# Patient Record
Sex: Female | Born: 1935 | Race: White | Hispanic: No | Smoking: Former smoker
Health system: Southern US, Community
[De-identification: ages and names within clinical notes are randomized; demographics above are authoritative.]

## PROBLEM LIST (undated history)

## (undated) DIAGNOSIS — Z780 Asymptomatic menopausal state: Secondary | ICD-10-CM

## (undated) DIAGNOSIS — F0390 Unspecified dementia without behavioral disturbance: Secondary | ICD-10-CM

## (undated) DIAGNOSIS — R918 Other nonspecific abnormal finding of lung field: Secondary | ICD-10-CM

## (undated) DIAGNOSIS — I779 Disorder of arteries and arterioles, unspecified: Secondary | ICD-10-CM

## (undated) DIAGNOSIS — M1990 Unspecified osteoarthritis, unspecified site: Secondary | ICD-10-CM

## (undated) DIAGNOSIS — I700 Atherosclerosis of aorta: Secondary | ICD-10-CM

## (undated) DIAGNOSIS — I739 Peripheral vascular disease, unspecified: Secondary | ICD-10-CM

## (undated) DIAGNOSIS — M810 Age-related osteoporosis without current pathological fracture: Secondary | ICD-10-CM

## (undated) DIAGNOSIS — I10 Essential (primary) hypertension: Secondary | ICD-10-CM

## (undated) DIAGNOSIS — I2510 Atherosclerotic heart disease of native coronary artery without angina pectoris: Secondary | ICD-10-CM

## (undated) DIAGNOSIS — A6000 Herpesviral infection of urogenital system, unspecified: Secondary | ICD-10-CM

## (undated) DIAGNOSIS — R251 Tremor, unspecified: Secondary | ICD-10-CM

## (undated) DIAGNOSIS — E785 Hyperlipidemia, unspecified: Secondary | ICD-10-CM

## (undated) DIAGNOSIS — E079 Disorder of thyroid, unspecified: Secondary | ICD-10-CM

## (undated) DIAGNOSIS — T462X1A Poisoning by other antidysrhythmic drugs, accidental (unintentional), initial encounter: Secondary | ICD-10-CM

## (undated) DIAGNOSIS — K635 Polyp of colon: Secondary | ICD-10-CM

## (undated) DIAGNOSIS — E042 Nontoxic multinodular goiter: Secondary | ICD-10-CM

## (undated) DIAGNOSIS — R300 Dysuria: Secondary | ICD-10-CM

## (undated) DIAGNOSIS — E041 Nontoxic single thyroid nodule: Secondary | ICD-10-CM

## (undated) DIAGNOSIS — E7800 Pure hypercholesterolemia, unspecified: Secondary | ICD-10-CM

## (undated) DIAGNOSIS — IMO0002 Reserved for concepts with insufficient information to code with codable children: Secondary | ICD-10-CM

## (undated) DIAGNOSIS — R52 Pain, unspecified: Secondary | ICD-10-CM

## (undated) DIAGNOSIS — M169 Osteoarthritis of hip, unspecified: Secondary | ICD-10-CM

## (undated) DIAGNOSIS — J3489 Other specified disorders of nose and nasal sinuses: Secondary | ICD-10-CM

## (undated) DIAGNOSIS — B029 Zoster without complications: Secondary | ICD-10-CM

## (undated) DIAGNOSIS — N390 Urinary tract infection, site not specified: Secondary | ICD-10-CM

## (undated) DIAGNOSIS — M79609 Pain in unspecified limb: Secondary | ICD-10-CM

## (undated) HISTORY — DX: Tremor, unspecified: R25.1

## (undated) HISTORY — PX: FOOT SURGERY: SHX648

## (undated) HISTORY — DX: Herpesviral infection of urogenital system, unspecified: A60.00

## (undated) HISTORY — DX: Other nonspecific abnormal finding of lung field: R91.8

## (undated) HISTORY — DX: Poisoning by other antidysrhythmic drugs, accidental (unintentional), initial encounter: T46.2X1A

## (undated) HISTORY — DX: Essential (primary) hypertension: I10

## (undated) HISTORY — DX: Disorder of arteries and arterioles, unspecified: I77.9

## (undated) HISTORY — DX: Peripheral vascular disease, unspecified: I73.9

## (undated) HISTORY — PX: TOTAL HIP ARTHROPLASTY: SHX124

## (undated) HISTORY — DX: Atherosclerotic heart disease of native coronary artery without angina pectoris: I25.10

## (undated) HISTORY — DX: Polyp of colon: K63.5

## (undated) HISTORY — DX: Disorder of thyroid, unspecified: E07.9

## (undated) HISTORY — DX: Atherosclerosis of aorta: I70.0

## (undated) HISTORY — DX: Hyperlipidemia, unspecified: E78.5

## (undated) HISTORY — DX: Unspecified osteoarthritis, unspecified site: M19.90

## (undated) HISTORY — PX: BREAST CYST EXCISION: SHX579

## (undated) HISTORY — DX: Age-related osteoporosis without current pathological fracture: M81.0

## (undated) MED ORDER — OLMESARTAN 20 MG TAB
20 mg | ORAL_TABLET | Freq: Every day | ORAL | Status: DC
Start: ? — End: 2012-10-28

## (undated) MED ORDER — MOMETASONE 50 MCG/ACTUATION NASAL SPRAY: 50 mcg/actuation | Freq: Every day | NASAL | Status: AC

## (undated) MED ORDER — VALSARTAN-HYDROCHLOROTHIAZIDE 160 MG-12.5 MG TAB: ORAL_TABLET | Freq: Every day | ORAL | Status: AC

## (undated) MED ORDER — PRAVASTATIN 80 MG TAB
80 mg | ORAL_TABLET | ORAL | Status: DC
Start: ? — End: 2013-03-14

## (undated) MED ORDER — VALACYCLOVIR 500 MG TAB: 500 mg | ORAL_TABLET | Freq: Every day | ORAL | Status: AC

## (undated) MED ORDER — PRAVASTATIN 80 MG TAB: 80 mg | ORAL_TABLET | ORAL | Status: AC

## (undated) MED ORDER — VALSARTAN-HYDROCHLOROTHIAZIDE 160 MG-12.5 MG TAB
ORAL_TABLET | Freq: Every day | ORAL | Status: DC
Start: ? — End: 2013-03-14

## (undated) MED ORDER — FUROSEMIDE 20 MG TAB
20 mg | ORAL_TABLET | ORAL | Status: DC
Start: ? — End: 2012-10-28

## (undated) MED ORDER — RALOXIFENE 60 MG TAB: 60 mg | ORAL_TABLET | ORAL | Status: AC

---

## 1983-10-17 HISTORY — PX: BREAST SURGERY: SHX581

## 1986-10-16 HISTORY — PX: APPENDECTOMY: SHX54

## 1986-10-16 HISTORY — PX: ABDOMINAL HYSTERECTOMY: SHX81

## 2008-01-30 LAB — CBC WITH AUTOMATED DIFF
ABS. BASOPHILS: 0 10*3/uL (ref 0.0–0.1)
ABS. EOSINOPHILS: 0.1 10*3/uL (ref 0.0–0.4)
ABS. LYMPHOCYTES: 2.1 10*3/uL (ref 0.8–3.5)
ABS. MONOCYTES: 0.3 10*3/uL (ref 0–1.0)
ABS. NEUTROPHILS: 2.7 10*3/uL (ref 1.8–8.0)
BASOPHILS: 1 % (ref 0–1)
EOSINOPHILS: 2 % (ref 0–7)
HCT: 45.1 % (ref 35.0–47.0)
HGB: 13.9 g/dL (ref 11.5–16.0)
LYMPHOCYTES: 40 % (ref 12–49)
MCH: 28.5 PG (ref 26.0–34.0)
MCHC: 30.8 g/dL (ref 30.0–35.0)
MCV: 92.4 FL (ref 80.0–99.0)
MONOCYTES: 6 % (ref 5–13)
NEUTROPHILS: 51 % (ref 32–75)
PLATELET: 302 10*3/uL (ref 150–400)
RBC: 4.88 M/uL (ref 3.80–5.20)
RDW: 14.5 % (ref 11.5–14.5)
WBC: 5.2 10*3/uL (ref 3.6–11.0)

## 2008-01-30 LAB — METABOLIC PANEL, COMPREHENSIVE
A-G Ratio: 1.2 (ref 1.1–2.2)
ALT (SGPT): 42 U/L (ref 30–65)
AST (SGOT): 19 U/L (ref 15–37)
Albumin: 3.8 g/dL (ref 3.5–5.0)
Alk. phosphatase: 92 U/L (ref 50–136)
Anion gap: 5 mmol/L (ref 5–15)
BUN/Creatinine ratio: 13 (ref 12–20)
BUN: 12 MG/DL (ref 6–20)
Bilirubin, total: 0.3 MG/DL (ref <1.0)
CO2: 29 MMOL/L (ref 21–32)
Calcium: 9 MG/DL (ref 8.5–10.1)
Chloride: 110 MMOL/L — ABNORMAL HIGH (ref 97–108)
Creatinine: 0.9 MG/DL (ref 0.6–1.3)
GFR est AA: >60 mL/min/{1.73_m2} (ref >60)
GFR est non-AA: >60 mL/min/{1.73_m2} (ref >60)
Globulin: 3.2 g/dL (ref 2.0–4.0)
Glucose: 98 MG/DL (ref 50–100)
Potassium: 4.1 MMOL/L (ref 3.5–5.1)
Protein, total: 7 g/dL (ref 6.4–8.2)
Sodium: 144 MMOL/L (ref 136–145)

## 2008-01-30 LAB — CK: CK: 182 U/L (ref 21–215)

## 2008-01-30 LAB — URINALYSIS W/MICROSCOPIC
Bilirubin: NEGATIVE
Glucose: NEGATIVE MG/DL
Ketone: NEGATIVE MG/DL
Leukocyte Esterase: NEGATIVE
Nitrites: NEGATIVE
Protein: NEGATIVE MG/DL
Specific gravity: 1.019 (ref 1.003–1.030)
Urobilinogen: 0.2 EU/DL (ref 0.2–1.0)
pH (UA): 6 (ref 5.0–8.0)

## 2008-01-30 LAB — LIPID PANEL
CHOL/HDL Ratio: 3.6 (ref 0–5.0)
Cholesterol, total: 193 MG/DL (ref <200)
HDL Cholesterol: 53 MG/DL (ref 40–60)
LDL, calculated: 105.6 MG/DL — ABNORMAL HIGH (ref 0–100)
Triglyceride: 172 MG/DL (ref 30–200)
VLDL, calculated: 34.4 MG/DL

## 2008-01-30 LAB — TSH 3RD GENERATION: TSH: 1.48 u[IU]/mL (ref 0.35–5.5)

## 2008-02-01 LAB — CULTURE, URINE
Colonies Counted: >100000
Colony Count: >100000

## 2008-02-01 LAB — VITAMIN D, 25 HYDROXY: Vitamin D 25-Hydroxy: 34 ng/mL (ref 30–80)

## 2008-02-14 DIAGNOSIS — Q6101 Congenital single renal cyst: Secondary | ICD-10-CM | POA: Insufficient documentation

## 2008-02-25 LAB — URINALYSIS W/MICROSCOPIC
Bacteria: NEGATIVE /HPF
Bilirubin: NEGATIVE
Glucose: NEGATIVE MG/DL
Ketone: NEGATIVE MG/DL
Leukocyte Esterase: NEGATIVE
Nitrites: NEGATIVE
Protein: NEGATIVE MG/DL
Specific gravity: 1.017 (ref 1.003–1.030)
Urobilinogen: 0.2 EU/DL (ref 0.2–1.0)
pH (UA): 5.5 (ref 5.0–8.0)

## 2008-06-10 MED ORDER — ZOSTER VACCINE LIVE (PF) 19,400 UNIT SUB-Q SOLN
19400 unit/0.65 mL | Freq: Once | SUBCUTANEOUS | Status: DC
Start: 2008-06-10 — End: 2008-09-23

## 2008-06-10 MED ORDER — COLESEVELAM 625 MG TAB
625 mg | ORAL_TABLET | Freq: Two times a day (BID) | ORAL | Status: DC
Start: 2008-06-10 — End: 2008-09-23

## 2008-06-10 NOTE — Patient Instructions (Signed)
English   Spanish   Back Pain: After Your Visit  Your Care Instructions  Back pain has many possible causes. It is often related to problems with muscles and ligaments of the back. It may also be related to problems with the nerves, discs, or bones of the back. Moving, lifting, standing, sitting, or sleeping in an awkward way can strain the back. Sometimes you do not notice the injury until later. Arthritis is another common cause of back pain.  Although it may hurt a lot, back pain usually improves on its own within several weeks. Most people recover in 12 weeks or less. Using good home treatment and being careful not to stress your back can help you feel better sooner.  Follow-up care is a key part of your treatment and safety. Be sure to make and go to all appointments, and call your doctor if you are having problems. It???s also a good idea to know your test results and keep a list of the medicines you take.  How can you care for yourself at home?  ?? Sit or lie in positions that are most comfortable and reduce your pain. Try one of these positions when you lie down:   Lie on your back with your knees bent and supported by large pillows.   Lie on the floor with your legs on the seat of a sofa or chair.   Lie on your side with your knees and hips bent and a pillow between your legs.   Lie on your stomach if it does not make pain worse.  Do not sit up in bed, and avoid soft couches and twisted positions. Bed rest can help relieve pain at first, but it delays healing. Avoid bed rest after the first day.   Change positions every 30 minutes. If you must sit for long periods of time, take breaks from sitting. Get up and walk around, or lie in a comfortable position.   For the first 2 or 3 days, put ice or cold packs on your back for 10 to 20 minutes at a time, several times a day. (Put a thin cloth between the ice pack and your skin.) This reduces pain.    After the first 2 or 3 days, use a warm pack or heating pad for 20 minutes at a time. Hot showers will also help. Hot baths may help as long as you can lie or sit in a position that does not stress your back. You may also keep using ice if it helps.   Take pain medicines exactly as directed.   If the doctor gave you a prescription medicine for pain, take it as prescribed.   If you are not taking a prescription pain medicine, ask your doctor if you can take an over-the-counter medicine.   Do not take two or more pain medicines at the same time unless the doctor told you to. Many pain medicines have acetaminophen, which is Tylenol. Too much acetaminophen (Tylenol) can be harmful.  Take short walks several times a day. You can start with 5 to 10 minutes, 3 to 4 times a day, and work up to longer walks. Stick to level surfaces and avoid hills and stairs until your back is better.   Return to work and other activities as soon as you can. Continued rest without activity is usually not good for your back.   To prevent future back pain, do exercises to stretch and strengthen your back and stomach. Learn how  to use good posture, safe lifting techniques, and proper body mechanics.  When should you call for help?  Call 911 anytime you think you may need emergency care. For example, call if:  ?? You lose bladder or bowel control.   You suddenly cannot walk or stand.   You have sudden numbness or weakness in both legs.  Call your doctor now or seek immediate medical care if:  ?? You have new pain, numbness, tingling, or weakness, especially in the buttocks, genital or rectal area, legs, or feet.   You have symptoms of a urinary infection. For example:   You have blood or pus in your urine.   You have pain in your back just below your rib cage. This is called flank pain.   You have a fever, chills, or body aches.   It hurts to urinate. You have groin or belly pain.   Watch closely for changes in your health, and be sure to contact your doctor if:  ?? Your back pain gets worse or more frequent.   Your back pain is not better after 1 week of home treatment. It may take a lot longer for the pain to go away completely, but it should feel at least a little better.   Where can you learn more?   Go to [email or web address]  Enter I594 in the search box to learn more about "Back Pain: After Your Visit".    ?? 2006 - 2009 [employer]. Care instructions adapted under license by [employer] (which disclaims liability or warranty for this information) . This care instruction is for use with your licensed healthcare professional. If you have questions about a medical condition or this instruction, always ask your healthcare professional. [employer]wise disclaims any warranty or liability for your use of this information.  English   Spanish   High Blood Pressure: After Your Visit  Your Care Instructions  If your blood pressure is usually above 140/90, you have high blood pressure, or hypertension. Despite what a lot of people think, high blood pressure usually does not cause headaches or make you feel dizzy or lightheaded. It usually has no symptoms. But it does increase your risk for heart attack, stroke, and kidney or eye damage. The higher your blood pressure, the more your risk increases.  Changes in your lifestyle, such as staying at a healthy weight, may help you lower your blood pressure. Your treatment also will include medicine. If you stop taking your medicine, your blood pressure will go back up.  Follow-up care is a key part of your treatment and safety. Be sure to make and go to all appointments, and call your doctor if you are having problems. It???s also a good idea to know your test results and keep a list of the medicines you take.  How can you care for yourself at home?  Medical treatment   ?? Take your medicine exactly as prescribed. You may take one or more types of medicine to lower your blood pressure. They include diuretics, beta-blockers, ACE inhibitors, calcium channel blockers, angiotensin II receptor blockers, and other medicines. Call your doctor if you think you are having a problem with your medicine.   Your doctor may suggest that you take one low-dose aspirin (81 mg) a day. This can help reduce your risk of having a stroke or heart attack.   See your doctor at least 2 times a year. You may need to see the doctor more often at first or until your  blood pressure comes down.   If you are taking blood pressure medicine, talk to your doctor before you take decongestants or anti-inflammatory medicine, such as ibuprofen. Some of these medicines can raise blood pressure.   Learn how to check your blood pressure at home.  Lifestyle changes  ?? Stay at a healthy weight. This is especially important if you put on weight around the waist. Losing even 10 pounds can help you lower your blood pressure.   If your doctor recommends it, get more exercise. Walking is a good choice. Bit by bit, increase the amount you walk every day. Try for at least 30 minutes on most days of the week. You also may want to swim, bike, or do other activities.   Avoid or limit alcohol. Talk to your doctor about whether you can drink any alcohol.   Limit salt.   Eat plenty of fruits (such as bananas and oranges), vegetables, legumes, whole grains, and low-fat dairy products.   Lower the amount of saturated fat in your diet. Saturated fat is found in animal products such as milk, cheese, and meat. Limiting these foods may help you lose weight and also lower your risk for heart disease.   Do not smoke. Smoking increases your risk for heart attack and stroke. If you need help quitting, talk to your doctor about stop-smoking programs and medicines. These can increase your chances of quitting for good.   When should you call for help?  Call 911 anytime you think you may need emergency care. For example, call if:  ?? You have chest pain or pressure. This may occur with:   Sweating.   Shortness of breath.   Nausea or vomiting.   Pain that spreads from the chest to the neck, jaw, or one or both shoulders or arms.   Dizziness or lightheadedness.   A fast or uneven pulse.  After calling 911, chew 1 adult-strength aspirin. Wait for an ambulance. Do not try to drive yourself.   ?? You have signs of a stroke. These include:   Sudden numbness, paralysis, or weakness in your face, arm, or leg, especially on only one side of your body.   New problems with walking or balance.   Sudden vision changes.   New problems speaking or understanding simple statements, or feeling confused.   A sudden, severe headache that is different from past headaches.  Call your doctor now or seek immediate medical care if:  ?? Your blood pressure rises suddenly.   Your blood pressure is 180/110 or higher.   You are dizzy or lightheaded, or you feel like you may faint.   Your blood pressure is higher than 140/90 on two or more occasions.  Watch closely for changes in your health, and be sure to contact your doctor if:  ?? You do not get better as expected.   Where can you learn more?   Go to [email or web address]  Enter [phone] in the search box to learn more about "High Blood Pressure: After Your Visit".    ?? 2006 - 2009 [employer]. Care instructions adapted under license by [employer] (which disclaims liability or warranty for this information) . This care instruction is for use with your licensed healthcare professional. If you have questions about a medical condition or this instruction, always ask your healthcare professional. [employer] disclaims any warranty or liability for your use of this information.  English   Spanish   Hyperlipidemia: After Your Visit  Your Care Instructions  Hyperlipidemia is too much fat in your blood. The body has several kinds of fat, including cholesterol and triglycerides. Your body needs fat for many things, such as making new cells. But too much fat in your blood increases your chances of having a heart attack or stroke.  You may be able to lower your cholesterol and triglycerides with a heart-healthy diet, exercise, and if needed, medicine. Your doctor may want you to try lifestyle changes to see whether they lower the fat in your blood. You may need to take medicine if lifestyle changes do not lower the fat in your blood enough.  Follow-up care is a key part of your treatment and safety. Be sure to make and go to all appointments, and call your doctor if you are having problems. It???s also a good idea to know your test results and keep a list of the medicines you take.  How can you care for yourself at home?  Take your medicines  ?? Take your medicines exactly as prescribed. Call your doctor if you think you are having a problem with your medicine.   If you take medicine to lower your cholesterol, go to follow-up visits. You will need to have blood tests.   Do not take large doses of niacin, which is a B vitamin, while taking medicine called statins. It may increase the chance of muscle pain and liver problems.   Talk to your doctor about avoiding grapefruit juice if you are taking statins. Grapefruit juice can raise the level of this medicine in your blood. This could increase side effects.  Eat more fruits, vegetables, and fiber  ?? Fruits and vegetables have lots of nutrients that help protect against heart disease, and they have little???if any???fat. Try to eat at least five servings a day. Dark green, deep orange, or yellow fruits and vegetables are healthy choices.    Keep carrots, celery, and other veggies handy for snacks. Buy fruit that is in season and store it where you can see it so that you will be tempted to eat it. Cook dishes that have a lot of veggies in them, such as stir fries and soups.   Foods high in fiber may reduce your cholesterol and provide important vitamins and minerals. High-fiber foods include whole-grain cereals and breads, oatmeal, beans, brown rice, citrus fruits, and apples.   Buy whole-grain breads and cereals instead of white bread and pastries.  Limit saturated fat  ?? Read food labels and try to avoid saturated fat and trans fat. They increase your risk of heart disease.   Use olive or canola oil when you cook. Try cholesterol-lowering spreads, such as Benecol or Take Control.   Bake, broil, grill, or steam foods instead of frying them.   Limit the amount of high-fat meats you eat, including hot dogs and sausages. Cut out all visible fat when you prepare meat.   Eat fish, skinless poultry, and soy products such as tofu instead of high-fat meats. Soybeans may be especially good for your heart. Eat at least two servings of fish a week. Certain fish, such as salmon, contain omega-3 fatty acids, which may help reduce your risk of heart attack.   Choose low-fat or fat-free milk and dairy products.  Get exercise, limit alcohol, and quit smoking  ?? Get more exercise. Work with your doctor to set up an exercise program. Even if you can do only a small amount, exercise will help you get stronger, have more energy, and manage your  weight and your stress. Walking is an easy way to get exercise. Gradually increase the amount you walk every day. Aim for at least 30 minutes on most days of the week. You also may want to swim, bike, or do other activities.   Limit alcohol to no more than 2 drinks a day for men and 1 drink a day for women. Too much alcohol can increase triglycerides and cause other health problems.    Do not smoke. If you need help quitting, talk to your doctor about stop-smoking programs and medicines. These can increase your chances of quitting for good.  When should you call for help?  Call 911 anytime you think you may need emergency care. For example, call if:  ?? You have chest pain or pressure. This may occur with:   Sweating.   Shortness of breath.   Nausea or vomiting.   Pain that spreads from the chest to the neck, jaw, or one or both shoulders or arms.   Dizziness or lightheadedness.   A fast or uneven pulse.  After calling 911, chew 1 adult-strength aspirin. Wait for an ambulance. Do not try to drive yourself.   ?? You have signs of a stroke. These may include:   Sudden numbness, paralysis, or weakness in your face, arm, or leg, especially on only one side of your body.   New problems with walking or balance.   Sudden vision changes.   Drooling or slurred speech.   New problems speaking or understanding simple statements, or feeling confused.   A sudden, severe headache that is different from past headaches.  You pass out (lose consciousness).  Call your doctor now or seek immediate medical care if:  ?? You have muscle pain or weakness.  Watch closely for changes in your health, and be sure to contact your doctor if:  ?? You are very tired.   You have an upset stomach, gas, constipation, or belly pain or cramps.   Where can you learn more?   Go to [email or web address]  Enter C406 in the search box to learn more about "Hyperlipidemia: After Your Visit".    ?? 2006 - 2009 [employer]. Care instructions adapted under license by [employer] (which disclaims liability or warranty for this information) . This care instruction is for use with your licensed healthcare professional. If you have questions about a medical condition or this instruction, always ask your healthcare professional. [employer]wise disclaims any warranty or liability for your use of this information.   English   Spanish   The DASH Diet: After Your Visit  Your Care Instructions  The DASH diet is an eating plan that can help lower your blood pressure. DASH stands for Dietary Approaches to Stop Hypertension. Hypertension is high blood pressure.  The DASH diet focuses on eating foods that are high in calcium, potassium, and magnesium. These nutrients can lower blood pressure. The foods that are highest in these nutrients are fruits, vegetables, low-fat dairy products, nuts, seeds, and legumes. But taking calcium, potassium, and magnesium supplements instead of eating foods that are high in those nutrients does not have the same effect. The DASH diet also includes whole grains, fish, and poultry.  The DASH diet is one of several lifestyle changes your doctor may recommend to lower your high blood pressure. Your doctor may also want you to decrease the amount of sodium in your diet. Lowering sodium while following the DASH diet can lower blood pressure even further than just the DASH  diet alone.  Follow-up care is a key part of your treatment and safety. Be sure to make and go to all appointments, and call your doctor if you are having problems. It???s also a good idea to know your test results and keep a list of the medicines you take.  How can you care for yourself at home?  Following the DASH diet  ?? Eat 4 to 5 servings of fruit each day. A serving is 1 medium-sized piece of fruit, 1/2 cup chopped or canned fruit, 1/4 cup dried fruit, or 4 ounces (1/2 cup) of fruit juice. Choose fruit more often than fruit juice.   Eat 4 to 5 servings of vegetables each day. A serving is 1 cup of lettuce or raw leafy vegetables, 1/2 cup of chopped or cooked vegetables, or 6 ounces (3/4 cup) of vegetable juice. Choose vegetables more often than vegetable juice.   Get 3 servings of low-fat dairy each day. A serving is 8 ounces of milk, 1 cup of yogurt, or 1 1/2 ounces of cheese.    Eat 7 to 8 servings of grains each day. A serving is 1 slice of bread, 1 ounce of dry cereal, or 1/2 cup of cooked rice, pasta, or cooked cereal. Try to choose whole-grain products as much as possible.   Limit lean meat, poultry, and fish to 6 ounces each day. Six ounces is about the size of two decks of cards.   Eat 4 to 5 servings of nuts, seeds, and legumes (cooked dried beans, lentils, and split peas) each week. A serving is 1/3 cup of nuts, 2 tablespoons of seeds, or 1/4 cup cooked dried beans or peas.  Tips for success  ?? Start small. Do not try to make dramatic changes to your diet all at once. You might feel that you are missing out on your favorite foods and then be more likely to not follow the plan. Make small changes, and stick with them. Once those changes become habit, add a few more changes.   Try some of the following:   Make it a goal to eat a fruit or vegetable at every meal and at snacks. This will make it easy to get the recommended amount of fruits and vegetables each day.   Try yogurt topped with fruit and nuts for a snack or healthy dessert.   Add lettuce, tomato, cucumber, and onion to sandwiches.   Combine a ready-made pizza crust with low-fat mozzarella cheese and lots of vegetable toppings. Try using tomatoes, squash, spinach, broccoli, carrots, cauliflower, and onions.   Have a variety of cut-up vegetables with a low-fat dip as an appetizer instead of chips and dip.   Sprinkle sunflower seeds or chopped almonds over salads. Or try adding chopped walnuts or almonds to cooked vegetables.   Try some vegetarian meals using beans and peas. Add garbanzo or kidney beans to salads. Make burritos and tacos with mashed pinto beans or black beans.  When should you call for help?  Watch closely for changes in your health, and be sure to contact your doctor if:  ?? You would like help planning meals.   Where can you learn more?   Go to [email or web address]   Enter H967 in the search box to learn more about "The DASH Diet: After Your Visit".    ?? 2006 - 2009 [employer]. Care instructions adapted under license by [employer] (which disclaims liability or warranty for this information) . This care instruction is for use  with your licensed healthcare professional. If you have questions about a medical condition or this instruction, always ask your healthcare professional. [employer]wise disclaims any warranty or liability for your use of this information.  English   Spanish   Blood in the Urine: After Your Visit  Your Care Instructions  Blood in the urine, or hematuria, may make the urine look red, brown, or pink. There may be blood every time you urinate or just from time to time. You cannot always see blood in the urine, but it will show up in a urine test.  Blood in the urine may be serious. It should always be checked by a doctor. Your doctor may recommend more tests, including an X-ray, a CT scan, or a cystoscopy (which lets a doctor look inside the urethra and bladder).  Blood in the urine can be a sign of another problem. Common causes are bladder infections and kidney stones. An injury to your groin or your genital area can also cause bleeding in the urinary tract. Very hard exercise???such as running a marathon???can cause blood in the urine. Blood in the urine can also be a sign of kidney disease or cancer in the bladder or kidney. Many cases of blood in the urine are caused by a harmless condition that runs in families. This is called benign familial hematuria. It does not need any treatment.  Sometimes your urine may look red or brown even though it does not contain blood. For example, not getting enough fluids (dehydration), taking certain medicines, or having a liver problem can change the color of your urine. Eating foods such as beets, rhubarb, or blackberries or foods with red food coloring can make your urine look red or pink.   Follow-up care is a key part of your treatment and safety. Be sure to make and go to all appointments, and call your doctor if you are having problems. It???s also a good idea to know your test results and keep a list of the medicines you take.  When should you call for help?  Call your doctor now or seek immediate medical care if:  ?? You have symptoms of a urinary infection. For example:   You have pus in your urine.   You have pain in your back just below your rib cage. This is called flank pain.   You have a fever, chills, or body aches.   It hurts to urinate.   You have groin or belly pain.  You have more blood in your urine.  Watch closely for changes in your health, and be sure to contact your doctor if:  ?? You have new urination problems.   You do not get better as expected.   Where can you learn more?   Go to [email or web address]  Enter [phone] in the search box to learn more about "Blood in the Urine: After Your Visit".    ?? 2006 - 2009 [employer]. Care instructions adapted under license by [employer] (which disclaims liability or warranty for this information) . This care instruction is for use with your licensed healthcare professional. If you have questions about a medical condition or this instruction, always ask your healthcare professional. [employer]wise disclaims any warranty or liability for your use of this information.  English   Spanish   Blood in the Urine: After Your Visit  Your Care Instructions  Blood in the urine, or hematuria, may make the urine look red, brown, or pink. There may be blood every time  you urinate or just from time to time. You cannot always see blood in the urine, but it will show up in a urine test.  Blood in the urine may be serious. It should always be checked by a doctor. Your doctor may recommend more tests, including an X-ray, a CT scan, or a cystoscopy (which lets a doctor look inside the urethra and bladder).   Blood in the urine can be a sign of another problem. Common causes are bladder infections and kidney stones. An injury to your groin or your genital area can also cause bleeding in the urinary tract. Very hard exercise???such as running a marathon???can cause blood in the urine. Blood in the urine can also be a sign of kidney disease or cancer in the bladder or kidney. Many cases of blood in the urine are caused by a harmless condition that runs in families. This is called benign familial hematuria. It does not need any treatment.  Sometimes your urine may look red or brown even though it does not contain blood. For example, not getting enough fluids (dehydration), taking certain medicines, or having a liver problem can change the color of your urine. Eating foods such as beets, rhubarb, or blackberries or foods with red food coloring can make your urine look red or pink.  Follow-up care is a key part of your treatment and safety. Be sure to make and go to all appointments, and call your doctor if you are having problems. It???s also a good idea to know your test results and keep a list of the medicines you take.  When should you call for help?  Call your doctor now or seek immediate medical care if:  ?? You have symptoms of a urinary infection. For example:   You have pus in your urine.   You have pain in your back just below your rib cage. This is called flank pain.   You have a fever, chills, or body aches.   It hurts to urinate.   You have groin or belly pain.  You have more blood in your urine.  Watch closely for changes in your health, and be sure to contact your doctor if:  ?? You have new urination problems.   You do not get better as expected.   Where can you learn more?   Go to [employer]  Enter [phone] in the search box to learn more about "Blood in the Urine: After Your Visit".     ?? 2006 - 2009 [employer]wise, Incorporated. Care instructions adapted under license by [employer] (which disclaims liability or warranty for this information) . This care instruction is for use with your licensed healthcare professional. If you have questions about a medical condition or this instruction, always ask your healthcare professional. Healthwise disclaims any warranty or liability for your use of this information.

## 2008-06-10 NOTE — Progress Notes (Signed)
Here for BP check. Wants another Rx for Shingles vaccine. Wants written RX for the vaccine. Feels fine.

## 2008-07-12 NOTE — Progress Notes (Signed)
HISTORY OF PRESENT ILLNESS  Kathryn [name] is a 72 y.o. female presents with Hypertension, Back Pain, Hip Pain and Medication Management    Agree with nurse note.  Pt is tolerating her BP med well.  Needs refills.  She thinks that Pravachol might be affecting her thinking.  Patient denies vision changes, headaches, dizziness, chest pain, SOB, or swelling.        She is concerned about her Left hip and low back pain x mos.  Back pain is on the same side as her previous shingles.  She has seen a neurologist for it in the past.  Txd with Lyrica.  She stopped it in April because it made her too drowsy.  She has an appt with Ortho.  He denies bladder or fecal incontinence, abdominal pain, weakness, fever, or other associated symptoms.    She wants results of her back Xray, CT scan and labs.    ROS    Review of Systems negative except as noted above in HPI.      PHYSICAL EXAMINATION    Vital Signs  BP 124/82   Pulse 71   Temp 97.4 ??F (36.3 ??C)   Ht 5' 1.75" (1.568 m)   Wt 177 lb 6.4 oz (80.468 kg)   SpO2 97%   LMP Hysterectomy    General appearance - Well nourished. Well appearing.  Well developed.  No acute distress. Overweight.   Head - Normocephalic.  Atraumatic.  Non tender sinuses x 4.  Eyes - pupils equal and reactive, extraocular eye movements intact, sclera anicteric  Ears - Hearing is grossly normal bilaterally.  Bilateral TM's intact. External ear canals normal without evidence of blood or swelling.     Nose - normal and patent, no erythema, discharge or polyps   Mouth - mucous membranes moist, pharynx normal with cobblestone appearance.  No erythema, white exudate or obstruction.  Neck - supple, no significant adenopathy, carotids upstroke normal bilaterally, no carotid bruits are noted.  No thyromegaly noted.  Chest - clear to auscultation bilaterally anterriorly and posteriorly.  No wheezes, rales or rhonchi.  Breath sounds are symmetrical and unlabored bilaterally.   Heart - normal rate.  Regular rhythm, normal S1, S2, no murmurs, rubs, clicks or gallops  Abdomen - soft and nondistended.  No masses or organomegaly.  No rebound, rigidity or guarding.  Bowel sounds normal x 4 quadrants.  No tenderness noted.  Back exam - limited range of motion.  Increased muscle tension in the paravertebral musculature in the occipital, cervical, thoracic, lumbar and sacral regions.  No pain on palpation of the spinous processes in the cervical, thoracic, lumbar, sacral regions.  No CVA tenderness.  Neurological - awake, alert and oriented to person, place, and time and event.  Cranial nerves II through XII intact  Normal speech.  No focal findings.  Neck is supple without rigidity.  Muscle strength is +5/5 x 4 extremities.  Sensation is intact to light touch bilaterally.  Steady gait.  Negative Straight Leg Test bilaterally.    Musculoskeletal - Intact x 4 extremities.  Full ROM x 4 extremities.  No pain with movement.  No tenderness in the pelvis, pubic bone, bilateral hips, knees, ankles.  Mild pain in the buttocks with increased muscle tension.  Heme/Lymph - peripheral pulses normal x 4 extremities.  No peripheral edema is noted.  No cervical adenopathy noted.  Skin - no rashes, erythema, ecchymosis, lacerations, abrasions, suspicious moles noted  Psychological -   normal behavior, speech,  dress and thought processes.  Good insight. Good eye contact.  Normal affect.  Concerned mood.      ASSESSMENT and PLAN      Encounter Diagnoses   Code Name Primary? Qualifier   ??? 724.2A Low Back Pain Yes     Plan: RALOXIFENE 60 MG TAB, REFERRAL TO ORTHOPEDIC SURGERY   ??? 719.45L Left Hip Pain      Plan: REFERRAL TO ORTHOPEDIC SURGERY   ??? 599.21F Hematuria      Plan: REFERRAL TO UROLOGY   ??? 272.0J Hypercholesterolemia      Plan: PRAVASTATIN 40 MG TAB, COLESEVELAM 625 MG TAB   ??? 401.1 Essential Hypertension, Benign      Plan: VALSARTAN 160 MG TAB   ??? V05.21M Need for Shingles Vaccine       Plan: ZOSTER VACCINE LIVE (PF) 19,400 UNIT SUB-Q SOLN   ??? 753.10N Renal Cyst      Plan: REFERRAL TO UROLOGY     cardiovascular risk and specific lipid/LDL goals reviewed.  Praised pt for BP progress.  Thinking changes likely due to Lyrica.  Monitor symptoms.  Readdress at next ov.  use of aspirin to prevent MI and TIA's discussed  Continue current medications and care.    Prescriptions written and sent to pharmacy.  Medication side effects discussed.  Discussed LS Xray, CT abdomen and pelvis, most recent test results and goals with patient.  Recheck pertinent labs as instructed..  Get recent office visit notes from Ortho.  Referrals given.  Keep appointments with specialists.  Addressed weight, diet and exercise with patient.  Counseled patient on: back care, pain, kidney cysts, hematuria  Relevant handouts given and discussed with patient.  Immunizations noted.  Advise flu vaccine between the months September to December. Rx for Zostavax given.  Offered empathy, support, legitamation, prayers, partnership to patient.    Return to office in 4 mos for referral follow up, bp check or sooner if symptoms worsen or persist.    More than 30 mins spent with patient with more than 50% of this time spent in counseling or coordinating care.

## 2008-09-23 ENCOUNTER — Ambulatory Visit

## 2008-09-23 LAB — LIPID PANEL
CHOL/HDL Ratio: 4 (ref 0–5.0)
Cholesterol, total: 252 MG/DL — ABNORMAL HIGH (ref <200)
HDL Cholesterol: 63 MG/DL — ABNORMAL HIGH (ref 40–60)
LDL, calculated: 155.4 MG/DL — ABNORMAL HIGH (ref 0–100)
Triglyceride: 168 MG/DL (ref 30–200)
VLDL, calculated: 33.6 MG/DL

## 2008-09-23 MED ORDER — CARISOPRODOL 250 MG TAB
250 mg | ORAL_TABLET | Freq: Four times a day (QID) | ORAL | Status: DC | PRN
Start: 2008-09-23 — End: 2008-11-17

## 2008-09-23 MED ORDER — FUROSEMIDE 20 MG TAB
20 mg | ORAL_TABLET | ORAL | Status: DC | PRN
Start: 2008-09-23 — End: 2009-08-18

## 2008-09-23 MED ORDER — VALSARTAN 160 MG TAB
160 mg | ORAL_TABLET | Freq: Every day | ORAL | Status: DC
Start: 2008-09-23 — End: 2009-02-24

## 2008-09-23 MED ORDER — PRAVASTATIN 40 MG TAB
40 mg | ORAL_TABLET | Freq: Every day | ORAL | Status: DC
Start: 2008-09-23 — End: 2008-10-05

## 2008-09-23 NOTE — Progress Notes (Signed)
HISTORY OF PRESENT ILLNESS  Kathryn [name] is a 72 y.o. female presents with Hypertension, Cholesterol Problem, Medication Refill and Referral Follow Up    Agree with nurse note.  Pt is tolerating her meds well.  She wants refills printed.  She changed back to Pravachol due to [facility] costing $300/month and she is tolerating it well.      She has had low back pain that has made it difficult for her to walk.  She has seen a neurosurgeon then Dr. Christy Gentles a pain specialist then Dr. Philomena Doheny, ortho.  Txd with Darvocet and an epidural is scheduled this week.  He told her the bone cysts are not a problem.      She decided not to see an urologist.  She says she has had blood in her urine all her life.    ROS    Review of Systems negative except as noted above in HPI.    PHYSICAL EXAMINATION    Vital Signs  BP 116/80   Pulse 85   Temp(Src) 98.1 ??F (36.7 ??C) (Oral)   Wt 178 lb 3.2 oz (80.831 kg)   SpO2 98%   LMP Hysterectomy    General appearance - Well nourished. Well appearing.  Well developed.  No acute distress.   Head - Normocephalic.  Atraumatic.    Eyes - pupils equal and reactive, extraocular eye movements intact, sclera anicteric  Ears - Hearing is grossly normal bilaterally.    Nose - normal and patent, no erythema, discharge or polyps   Mouth - mucous membranes moist, pharynx normal with cobblestone appearance.  No erythema, white exudate or obstruction.  Neck - supple, no significant adenopathy, carotids upstroke normal bilaterally, no carotid bruits are noted.  No thyromegaly noted.  Chest - clear to auscultation bilaterally anterriorly and posteriorly.  No wheezes, rales or rhonchi.  Breath sounds are symmetrical and unlabored bilaterally.  Heart - normal rate.  Regular rhythm, normal S1, S2.  No murmur.  No rubs, clicks or gallops  Abdomen - soft and nondistended.  No masses or organomegaly.  No rebound, rigidity or guarding.  Bowel sounds normal x 4 quadrants.  No tenderness noted.   Back exam - limited range of motion.  No pain on palpation of the spinous processes in the cervical, thoracic, lumbar, sacral regions.  No CVA tenderness.  Increased muscle tension in the paravertebral musculature in the thoracic, lumbar and sacral regions.    Neurological - awake, alert and oriented to person, place, and time and event.  Cranial nerves II through XII intact.  Muscle strength is +5/5 x 4 extremities.  Sensation is intact to light touch bilaterally.  Slow and Steady gait with a slight limp. Positive Right Straight Leg Test.    Musculoskeletal - Intact x 4 extremities.  Full ROM x 4 extremities.  Pain with movement of hips.  No tenderness in the pelvis, pubic bone, knees, ankles.  Pain in Right hip to palpation.  Heme/Lymph - peripheral pulses normal x 4 extremities.  No peripheral edema is noted.  No cervical adenopathy noted.  Skin - no rashes, erythema, ecchymosis, lacerations, abrasions, suspicious moles noted  Psychological -   normal behavior, speech, dress and thought processes.  Good insight. Good eye contact.  Normal affect.  Appropriate mood.    ASSESSMENT and PLAN      Encounter Diagnoses   Code Name Primary? Qualifier   ??? 401.1 Essential Hypertension, Benign Yes     Plan: VALSARTAN 160 MG TAB   ???  272.0J Hypercholesterolemia      Plan: PRAVASTATIN 40 MG TAB, LIPID PANEL   ??? 719.50F Hip Pain     ??? 724.2A Low Back Pain      Plan: CARISOPRODOL 250 MG TAB   ??? 733.20AL Subchondral Cysts     ??? 599.70U Hematuria of Undiagnosed Cause     ??? 782.3Z Peripheral Edema      Plan: FUROSEMIDE 20 MG TAB     cardiovascular risk and specific lipid/LDL goals reviewed  use of aspirin to prevent MI and TIA's discussed  Continue current medications and care.    Prescriptions written and given to pt.  Medication side effects discussed.  Discussed test results and goals with patient.  Recheck pertinent labs today.  Recent office visit notes from Dr. Christy Gentles reviewed.   Get recent office visit notes from Dr. Philomena Doheny, neurosurgeon.  Referrals given.  Keep appointments with specialists.  Addressed weight, diet and exercise with patient.  Counseled patient on: back care, bp, ADR of Darvocet, constipation prevention  Relevant handouts given and discussed with patient.  Immunizations noted.  Advise flu vaccine between the months September to December.  Offered empathy, support, legitamation, prayers, partnership to patient.    Return to office in 4 mos for bp, chol or sooner if symptoms worsen or persist.    More than 30 mins spent with patient with more than 50% of this time spent in counseling or coordinating care.

## 2008-09-23 NOTE — Progress Notes (Addendum)
Addended by: Burna Mortimer on: 09/23/2008      Modules accepted: Orders

## 2008-10-01 NOTE — Progress Notes (Signed)
Quick Note:    Your cholesterol level is worse. Advise patient to eat a low fat diet.   Eat foods that are baked, broiled, boiled, steamed or grilled. Avoid greasy or fried foods. Use olive oil or canola oil instead of vegetable oil. Eat fish twice weekly. Exercise 5 days a week for 30 minutes a day. Stretch before and after exercising. Can add OTC Fish oil pills, Flax Seed, Coenzyme Q10. Decrease weight by 10 pounds, if overweight. Recheck cholesterol in 5 months.Increase Pravachol 40 mg to 80 mg daily.    ______

## 2008-10-05 ENCOUNTER — Ambulatory Visit

## 2008-10-05 DIAGNOSIS — B009 Herpesviral infection, unspecified: Secondary | ICD-10-CM | POA: Insufficient documentation

## 2008-10-05 MED ORDER — PRAVASTATIN 80 MG TAB
80 mg | ORAL_TABLET | Freq: Every day | ORAL | Status: DC
Start: 2008-10-05 — End: 2009-08-18

## 2008-10-05 MED ORDER — VALACYCLOVIR 1 G TAB
1 gram | ORAL_TABLET | Freq: Three times a day (TID) | ORAL | Status: DC
Start: 2008-10-05 — End: 2008-11-17

## 2008-10-05 NOTE — Progress Notes (Signed)
HISTORY OF PRESENT ILLNESS  Kathryn [name] is a 72 y.o. female presents with Rash, Results and Referral Follow Up    Agree with nurse note.  Pt has a rash with pus on her Left buttocks x 3 days.  Acute onset.  Initially itchy and painful.  Looks like the same rash she had when dxd with Shingles.  She is needing surgery on her Right hip for Left hip pain soon.  She wants meds for the Shingles today.  She has never seen her dermatologist for it.  She has had at least 4 outbreaks since 2008.  Rash occurs soon after she stresses her body using too many herbal colon cleanses.  She has never had the Shingles vaccine.    She had her cholesterol rechecked and wants results.  She needs 90-day Rx for Pravastatin at the higher dose.    ROS    Review of Systems negative except as noted above in HPI.    PHYSICAL EXAMINATION    Vital Signs  BP 140/82   Pulse 81   Temp(Src) 98 ??F (36.7 ??C) (Oral)   Wt 185 lb 12.8 oz (84.278 kg)   SpO2 98%    General appearance - Well nourished. Well appearing.  Well developed.  Acute distress with position change from seated to standing.   Eyes - pupils equal and reactive, extraocular eye movements intact, sclera anicteric  Ears - Hearing is grossly normal bilaterally.    Nose - normal and patent, no erythema, discharge or polyps   Mouth - mucous membranes moist, pharynx normal with cobblestone appearance.  No erythema, white exudate or obstruction.  Neck - supple, no significant adenopathy, carotids upstroke normal bilaterally, no carotid bruits are noted.  No thyromegaly noted.  Chest - clear to auscultation bilaterally anterriorly and posteriorly.  No wheezes, rales or rhonchi.  Breath sounds are symmetrical and unlabored bilaterally.  Heart - normal rate.  Regular rhythm, normal S1, S2.  No murmur.  No rubs, clicks or gallops  Back exam - limited range of motion.  No pain on palpation of the spinous processes in the cervical, thoracic, lumbar, sacral regions.     Neurological - awake, alert and oriented to person, place, and time and event.  Cranial nerves II through XII intact.  Muscle strength is +5/5 x 4 extremities.  Sensation is intact to light touch bilaterally.  Steady gait with slight limp.  Heme/Lymph - peripheral pulses normal x 4 extremities.  No peripheral edema is noted.  No cervical adenopathy noted.  Skin - no ecchymosis, lacerations, abrasions, suspicious moles noted.  Group of pustules on an erythematous base at Left S2 dermatome.  Psychological -   normal behavior, speech, dress and thought processes.  Good insight. Good eye contact.  Normal affect.  Tired mood.    ASSESSMENT and PLAN    1. Hip Pain (719.60F)     2. Shingles (053.9A)  valacyclovir (VALTREX) 1 g tablet, REFERRAL TO DERMATOLOGY   3. Other and Unspecified Hyperlipidemia (272.4)  pravastatin (PRAVACHOL) 80 mg tablet       cardiovascular risk and specific lipid/LDL goals reviewed  use of aspirin to prevent MI and TIA's discussed  Continue current medications and care.  Start Valtrex.  Increase Pravachol to 80 mg daily.  Prescriptions written and sent to pharmacy.  Medication side effects discussed.  Discussed test results and goals with patient.  Recheck lipids in 3 mos.  Get recent office visit notes from ortho.  Referrals given.  Keep appointments with specialists.  Addressed weight, diet and exercise with patient.  Counseled patient on: cholesterol, recurrent shingles vs HSV.  Stressors.  Relevant handouts given and discussed with patient.  Immunizations noted.  Advise flu vaccine between the months September to December.  Offered empathy, support, legitamation, prayers, partnership to patient.    Return to office in prn mos for shingles or sooner if symptoms worsen or persist.    More than 30 mins spent with patient with more than 50% of this time spent in counseling or coordinating care.

## 2008-10-05 NOTE — Patient Instructions (Signed)
English   Spanish  Shingles: After Your Visit  Your Care Instructions  Shingles (herpes zoster) causes pain and a blistered rash. The rash can appear anywhere on the body but will be on only one side of the body, the left or right. It will be in a band, a strip, or a small area. The pain can be very severe. Shingles can also cause tingling or itching in the area of the rash. The blisters scab over after a few days and heal in 2 to 4 weeks. Medicines can help you feel better and may help prevent more serious problems caused by shingles.  Shingles is caused by the same virus that causes chickenpox. When you have chickenpox, the virus gets into your nerve roots and stays there (becomes dormant) long after you get over the chickenpox. If the virus becomes active again, it can cause shingles.  Follow-up care is a key part of your treatment and safety. Be sure to make and go to all appointments, and call your doctor if you are having problems. It???s also a good idea to know your test results and keep a list of the medicines you take.  How can you care for yourself at home?  ?? Take your medicines exactly as prescribed. Call your doctor if you think you are having a problem with your medicine. Antiviral medicine helps you get better faster and may help prevent later problems.   ?? Try not to scratch or pick at the blisters. They will crust over and fall off on their own if you leave them alone.   ?? Put cool, wet cloths on the area to relieve pain and itching. You can also use calamine lotion. Try not to use so much lotion that it cakes and is hard to get off.   ?? Put cornstarch or baking soda on the sores to help dry them out so they heal faster.   ?? Do not use thick ointment, such as petroleum jelly, on the sores. This will keep them from drying and healing.    ?? To help remove loose crusts, soak them in tap water or Burow???s solution. This can help decrease oozing, and dry and soothe the skin. (Burow???s solution is sold without a prescription in most drugstores and supermarkets.)   ?? Take an over-the-counter pain medicine, such as acetaminophen (Tylenol), ibuprofen (Advil, Motrin), or naproxen (Aleve). Read and follow all instructions on the label.   ?? Avoid close contact with people until the blisters have healed. It is very important for you to avoid contact with anyone who has never had chickenpox or the chickenpox vaccine. Pregnant women, young babies, and anyone else who has a hard time fighting infection (such as someone with HIV, diabetes, or cancer) is especially at risk.  When should you call for help?  Call your doctor now or seek immediate medical care if:  ?? You have a new or higher fever.   ?? You have a severe headache and a stiff neck.   ?? You lose the ability to think clearly.   ?? The rash spreads to your forehead, nose, eyes, or eyelids.   ?? You have eye pain, or your vision gets worse.   ?? You have new pain in your face, or you cannot move the muscles in your face.   ?? Blisters spread to new parts of your body.  Watch closely for changes in your health, and be sure to contact your doctor if:  ?? The rash   has not healed after 2 to 4 weeks.   ?? You still have pain after the rash has healed.   Where can you learn more?   Go to http://www.healthwise.net/[facility]  Enter X176 in the search box to learn more about "Shingles: After Your Visit".   ?? 2006-2009 Healthwise, Incorporated. Care instructions adapted under license by [employer] (which disclaims liability or warranty for this information). This care instruction is for use with your licensed healthcare professional. If you have questions about a medical condition or this instruction, always ask your healthcare professional. Healthwise disclaims any warranty or liability for your use of this information.

## 2008-10-05 NOTE — Progress Notes (Signed)
Rash appeared on left buttocks on 10/02/2008.? Shingles.

## 2008-10-08 LAB — HSV TYPING
HSV 2 typing: POSITIVE — AB
HSV-1 typing: NEGATIVE

## 2008-10-08 LAB — CULTURE, HSV W/ TYPING

## 2008-10-13 NOTE — Progress Notes (Signed)
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 May need to decrease Valtrex 1 gram to 500 mg BID x 3 days during outbreaks. Please send pt info about HSV 2.  ______

## 2008-10-14 NOTE — Progress Notes (Signed)
Quick Note:    Advised of lab results.States will pick up booklet at next OV.Really feels the outbreak is from the herbal cleansers that she was using.Will not use anymore.  ______

## 2008-10-16 LAB — HM COLONOSCOPY

## 2008-11-17 MED ORDER — VALACYCLOVIR 500 MG TAB
500 mg | ORAL_TABLET | Freq: Every day | ORAL | Status: DC
Start: 2008-11-17 — End: 2009-02-26

## 2008-11-17 NOTE — Progress Notes (Signed)
Kathryn [name] is a 73 y.o. female patient who presents for a Pre-Op Examination.    Agree with Nurse history.    HISTORY OF PRESENT ILLNESS    She has been scheduled for Right Hip Replacement surgery on 12/21/08 by Dr. Cheral Almas.    Pt has had low back pain x years.  Pt saw Dr. Philomena Doheny for it in 2009.  Advised to have Right then Left Hip surgery.  Pt has had minimal relief using oral pain medications, steroid injections, exercises and water therapy.    Pt needs a refill of Valtrex.  Rash is better.    ROS     ROS is negative except as mentioned in the HPI.    Previous intolerance to Anesthesia? no    Latex Allergies?  no    ALLERGIES:  Allergies   Allergen Reactions   ??? Penicillins Rash and Swelling   ??? Other Rash     All cillins   ??? Zocor (Simvastatin) Other (comments)     Leg cramps   ??? Lipitor (Atorvastatin) Other (comments)     Leg cramps   ??? Niaspan (Niacin) Other (comments)     Caused elevated BP   ??? Crestor (Rosuvastatin) Other (comments)     Elevated CPK         CURRENT MEDICATIONS:  Current outpatient prescriptions:propoxyphene napsylate-acetaminophen (DARVOCET-N 100) 100-650 mg per tablet, Take 1 Tab by mouth every six (6) hours as needed., Disp: , Rfl: ;  valacyclovir (VALTREX) 500 mg tablet, Take 1 Tab by mouth daily., Disp: 30 Tab, Rfl: 5;  pravastatin (PRAVACHOL) 80 mg tablet, Take 1 Tab by mouth daily for 360 days. For cholesterol, Disp: 90 Tab, Rfl: 3  furosemide (LASIX) 20 mg tablet, Take 1 Tab by mouth as needed. For edema, Disp: 30 Tab, Rfl: 2;  valsartan (DIOVAN) 160 mg tablet, Take 1 Tab by mouth daily. For blood pressure, Disp: 30 Tab, Rfl: 11;  raloxifene (EVISTA) 60 mg tablet, Take  by mouth daily., Disp: , Rfl: ;  CLOBEX 0.05 % Sham, by Apply Externally route., Disp: , Rfl:     PAST MEDICAL HISTORY:  Past Medical History   Diagnosis Date   ??? Edema      left ankle   ??? DDD (Degenerative Disc Disease) 10/1999     L 4-5 by MRI.  Dr. Shiela Mayer.  Dr. Robie Ridge, Pain    ??? Urinary Incontinence, Stress      mild.  Dr. Winnifred Friar   ??? Polyp of Rectum      Dr. Reed Pandy   ??? Cystocele      Dr. Winnifred Friar   ??? Postmenopausal HRT (Hormone Replacement Therapy)      Hx  for 6 years.  Dr. Lavone Nian   ??? Morton's Neuroma 2000     Left.  Dr. Gillis Santa   ??? Hematuria of Undiagnosed Cause 1980, 2009   ??? Single Renal Cyst 02/12/2008     Left.  0.9 cm   ??? Subchondral Cysts 2009     Left hip femoral heads.  Dr. Adah Perl   ??? DJD (Degenerative Joint Disease) 05/26/04     Dr. Shiela Mayer.   ??? Hypercholesterolemia    ??? Hypertension    ??? Kidney Stone 12/2002     Dr. Manson Passey   ??? Chickenpox      childhood   ??? DJD (Degenerative Joint Disease) of Hip 2009     Dr. Kelby Aline.     ??? Rotator  Cuff Tendinitis 12/20/04     Right.  Dr. Jen Mow   ??? Benign Positional Vertigo 01/07/04     Dr. Minette Headland   ??? SOB (Shortness of Breath) 07/11/04     Dr. Abigail Miyamoto.  Neg Stress Cardiolite.  EF 60 %.   ??? Unspecified Vitamin D Deficiency 2008   ??? Herpes Zoster 02/26/07, 06/21/07, 08/27/07     Left buttocks then Right T2-3 dermatome   ??? Herpes Simplex Type 2 Infection 10/05/08     Left buttocks         PAST SURGICAL HISTORY:  Past Surgical History   Procedure Date   ??? Hx cyst removal 1970s     Rt breast then Left.  benign.   ??? Hx bunionectomy 1970s, 2003     Bilateral.  Dr. Stephannie Li.   ??? Hx hysterectomy 1988     with BSO   ??? Foot/toes surgery proc unlisted 2000     Left Morton's Neuroma.  Dr. Stephannie Li         FAMILY HISTORY:  Family History   Problem Relation   ??? Diabetes Mother   ??? Heart Attack Mother   ??? Cancer Father     pancreatic   ??? Cancer Brother     bile duct cancer   ??? Heart Attack Sister     age 108 y/o   ??? Arthritis-osteo Sister   ??? Asthma Sister         SOCIAL HISTORY:  History   Social History   ??? Marital Status: Married     Spouse Name: N/A     Number of Children: N/A   ??? Years of Education: N/A   Social History Main Topics   ??? Tobacco Use: Quit -- 0.5 packs/day for 10 years      Quit date: 10/17/1987   ??? Alcohol Use: No   ??? Drug Use: No   ??? Sexually Active: Yes -- Female partner(s)     Birth Control/ Protection: None   Other Topics Concern   ??? Not on file   Social History Narrative   ??? No narrative on file         IMMUNIZATIONS:  Immunization History   Administered Date(s) Administered   ??? H1N1 Influenza Virus Vaccine 09/15/2008   ??? Influenza Vaccine Whole 06/16/2008   ??? Pneumococcal Vaccine 10/16/1998   ??? TD Vaccine 01/06/2003         PHYSICAL EXAMINATION    Vital Signs BP 126/82   Pulse 91   Temp(Src) 98 ??F (36.7 ??C) (Oral)   Ht 5' 1.5" (1.562 m)   Wt 183 lb 6.4 oz (83.19 kg)   SpO2 98%    General appearance - Well nourished. Well appearing.  Well developed.  Overweight.   Head - Normocephalic.  Atraumatic.  Non tender sinuses x 4.  Eyes - pupils equal and reactive.  Extraocular eye movements intact.  Sclera anicteric.  Ears - bilateral TM's intact. External ear canals normal without evidence of blood or swelling.   Hearing is grossly normal bilaterally  Nose - normal and patent, no erythema, discharge or polyps   Mouth - mucous membranes moist, pharynx normal without lesions  Neck - supple, no significant adenopathy. No carotid bruits are noted bilaterally.  No thyromegaly noted.  Chest - clear to auscultation bilaterally anterriorly and posteriorly.  No wheezes.  No rales or rhonchi.  Breath sounds are symmetrical.  Respirations are unlabored.  Heart - normal rate.  Regular rhythm. Normal  S1, S2.  No murmurs, rubs, clicks or gallops noted.  Abdomen - soft and nondistended.  No masses or organomegaly.  No rebound, rigidity or guarding.  Bowel sounds normal x 4 quadrants.  No tenderness noted.  Back exam - normal range of motion.  No pain on palpation of the spinous processes in the cervical, thoracic, lumbar, sacral regions.  No CVA tenderness.   Neurological - awake, alert and oriented to person, place, and time and event.  Cranial nerves II through XII intact  Normal speech.  No focal findings.  Muscle strength is +5/5 x 4 extremities.  Sensation is intact to light touch bilaterally.  Steady gait with slight limp.    Musculoskeletal - Intact x 4 extremities.  Full ROM x 4 extremities. Hip pain with movement.  No pain on palpation of the bilateral shoulders, elbows, wrists, hands.  No tenderness in the pelvis, pubic bone, bilateral hips, knees, ankles.  No obvious deformity or swelling  Heme/Lymph - peripheral pulses normal x 4 extremities.  No peripheral edema is noted.   Skin - no rashes, erythema, ecchymosis, lacerations, abrasions  Psychological -   normal behavior, speech, dress and thought processes.  Good insight. Good eye contact.  Normal affect.  Normal mood.  GYN EXAM:  External genitalia, vagina and vulva are normal.  No vaginal discharge is noted.  Cervix is normal appearance.  No cervical motion tenderness.  Uterus is normal without tenderness.  Adnexa normal in size without masses or tenderness. Pap Smear - is completed 09/2008.   BREASTS:  Breasts are symmetric with fibrocystic changes.  No dominant, discrete, fixed  or suspicious masses are noted.  No skin changes or axillary nodes.  No nipple inversion or discharge. Per exam in 09/2008  RECTAL:  Good sphincter tone.  No hemorrhoids or fissures noted.  No active bleeding.  Normal soft brown stool noted in the rectal vault.  Negative guaiac test. Per exam in 09/2008      ASSESSMENT/PLAN    1. Preop Examination (V72.84B)     2. Essential Hypertension, Benign (401.1)     3. DJD (Degenerative Joint Disease) of Hip (715.15N)  propoxyphene napsylate-acetaminophen (DARVOCET-N 100) 100-650 mg per tablet   4. Herpes Simplex Type 2 Infection (054.9BS)  valacyclovir (VALTREX) 500 mg tablet   5. Pure Hypercholesterolemia (272.0)       Continue current medications, care and follow up with specialists.   Prescription medications sent to the pharmacist or given to the patient.  Side effects discussed with patient.  Pertinent labs or tests ordered.    Based upon the examination performed today, no contraindications have been noted and Sena Hitch is an acceptable risk for the requested proposed procedure.  Please proceed with the surgery as scheduled.      _____________________________________    Lucy Chris Harle Stanford, D.O.  [facility]  [address]  [place], [place]  [address]  [phone] phone  [phone] fax  Nurse:  Catarina Hartshorn

## 2008-11-17 NOTE — Progress Notes (Signed)
Here for pre op Pe for Rt hip replacement.

## 2008-11-30 LAB — METABOLIC PANEL, COMPREHENSIVE
A-G Ratio: 1 — ABNORMAL LOW (ref 1.1–2.2)
ALT (SGPT): 44 U/L (ref 30–65)
AST (SGOT): 17 U/L (ref 15–37)
Albumin: 3.3 g/dL — ABNORMAL LOW (ref 3.5–5.0)
Alk. phosphatase: 77 U/L (ref 50–136)
Anion gap: 10 mmol/L (ref 5–15)
BUN/Creatinine ratio: 19 (ref 12–20)
BUN: 15 MG/DL (ref 6–20)
Bilirubin, total: 0.2 MG/DL (ref 0.2–1.0)
CO2: 27 MMOL/L (ref 21–32)
Calcium: 8.7 MG/DL (ref 8.5–10.1)
Chloride: 108 MMOL/L (ref 97–108)
Creatinine: 0.8 MG/DL (ref 0.6–1.3)
GFR est AA: >60 mL/min/{1.73_m2} (ref >60)
GFR est non-AA: >60 mL/min/{1.73_m2} (ref >60)
Globulin: 3.2 g/dL (ref 2.0–4.0)
Glucose: 110 MG/DL — ABNORMAL HIGH (ref 50–100)
Potassium: 3.4 MMOL/L — ABNORMAL LOW (ref 3.5–5.1)
Protein, total: 6.5 g/dL (ref 6.4–8.4)
Sodium: 145 MMOL/L (ref 136–145)

## 2008-11-30 LAB — CBC WITH AUTOMATED DIFF
ABS. BASOPHILS: 0 10*3/uL (ref 0.0–0.1)
ABS. EOSINOPHILS: 0 10*3/uL (ref 0.0–0.4)
ABS. LYMPHOCYTES: 2.1 10*3/uL (ref 0.8–3.5)
ABS. MONOCYTES: 0.5 10*3/uL (ref 0.0–1.0)
ABS. NEUTROPHILS: 4.8 10*3/uL (ref 1.8–8.0)
BASOPHILS: 0 % (ref 0–1)
EOSINOPHILS: 0 % (ref 0–7)
HCT: 41.1 % (ref 35.0–47.0)
HGB: 13.3 g/dL (ref 11.5–16.0)
LYMPHOCYTES: 29 % (ref 12–49)
MCH: 30 PG (ref 26.0–34.0)
MCHC: 32.4 g/dL (ref 30.0–36.5)
MCV: 92.8 FL (ref 80.0–99.0)
MONOCYTES: 6 % (ref 5–13)
NEUTROPHILS: 65 % (ref 32–75)
PLATELET: 338 10*3/uL (ref 150–400)
RBC: 4.43 M/uL (ref 3.80–5.20)
RDW: 14.5 % (ref 11.5–14.5)
WBC: 7.4 10*3/uL (ref 3.6–11.0)

## 2008-11-30 LAB — URINALYSIS W/ REFLEX CULTURE
Bilirubin: NEGATIVE
Glucose: NEGATIVE MG/DL
Ketone: NEGATIVE MG/DL
Leukocyte Esterase: NEGATIVE
Nitrites: NEGATIVE
Specific gravity: 1.03 (ref 1.003–1.030)
Urobilinogen: 0.2 EU/DL (ref 0.2–1.0)
pH (UA): 6 (ref 5.0–8.0)

## 2008-11-30 LAB — PROTHROMBIN TIME + INR
INR: 1 (ref 0.9–1.1)
Prothrombin time: 10.4 SECS (ref 9.0–11.0)

## 2008-11-30 LAB — PTT: aPTT: 25.8 s (ref 24.0–33.0)

## 2008-12-01 LAB — TYPE AND SCREEN
ABO/Rh: O POS
Antibody Screen: NEGATIVE

## 2008-12-01 LAB — TYPE & SCREEN
ABO/Rh(D): O POS
Antibody screen: NEGATIVE

## 2008-12-02 LAB — CULTURE, URINE
Colonies Counted: <1000
Colony Count: <1000
Culture result:: NO GROWTH
Culture: NO GROWTH

## 2008-12-21 LAB — TYPE AND SCREEN
ABO/Rh: O POS
Antibody Screen: NEGATIVE

## 2008-12-21 LAB — TYPE & SCREEN
ABO/Rh(D): O POS
Antibody screen: NEGATIVE

## 2008-12-21 NOTE — Op Note (Signed)
Op Notes signed by  at 12/21/08 1548                  Author: Birdena Crandall, MD  Service: --  Author Type: Physician            Filed:   Date of Service: 12/21/08 1219  Status: Signed          <!--EPICS--> Name:      Kathryn [name], Kathryn [name]<BR> MR #:      [id number]                    Surgeon:        Carlyle Lipa, <BR> M.D.<BR> Account #:  [id number]                 Surgery Date:   12/21/2008<BR> DOB:       Apr 10, 1936<BR> Age:       73                           Location:       5S1 568<BR> <BR>                              OPERATIVE REPORT<BR> <BR> <BR> <BR> PREOPERATIVE DIAGNOSIS:  Right  hip avascular necrosis.<BR> <BR> POSTOPERATIVE DIAGNOSIS:  Right hip avascular necrosis.<BR> <BR> PROCEDURE:  Right total hip replacement.<BR> <BR> SURGEON:  Carlyle Lipa, MD.<BR> <BR> ASSISTANT:  Ladean Raya.<BR> <BR> ANESTHETIC:  Spinal.<BR>  <BR> ESTIMATED BLOOD LOSS:  150 mL.<BR> <BR> SPECIMENS:  None.<BR> <BR> DRAINS:  Hemovac x1.<BR> <BR> IMPLANT:  DePuy S-ROM stem, DePuy Pinnacle cup, metal ball and cross-link<BR> polyethylene liner.<BR> <BR> COUNTS:  Sponge, instrument, and needle count  correct at the end of the<BR> procedure.<BR> <BR> COMPLICATIONS:  None.<BR> <BR> PREOPERATIVE ANTIBIOTICS:  Ancef.<BR> <BR> INDICATIONS:  This is a 73 year old woman with intractable hip pain, who<BR> presents for an elective total hip arthroplasty on  the right for avascular<BR> necrosis.<BR> <BR> DESCRIPTION OF PROCEDURE:  Anesthetic was initiated.  Preoperative dose of<BR> Ancef was given.  Foley catheter was placed.  She was turned in the lateral<BR> position.  Right lower extremity was prepped  and draped in the usual<BR> sterile fashion.  The skin was covered completely with Ioban occlusive<BR> dressing.<BR> <BR> Posterolateral exposure was made to the patient's hip.  Short rotators and<BR> posterior capsule were saved for repair at the end  of the procedure.<BR> Straight reamer was placed into the notch and leg  length mark was made.<BR> The hip was dislocated.  Femoral neck osteotomy was made.  Acetabulum was<BR> exposed and progressively reamed to 51 mm.  A 52 trial was impacted with<BR>  good press fit.  This was removed and a 52 pinnacle sector shell was<BR> impacted into the patient's acetabulum in 40 degrees of abduction and in an<BR> anatomic-type anteversion.  Hole eliminator and two 6.5 dome screws were<BR> placed to enhance stability.<BR>  <BR> The real 36 cross-linked polyethylene liner was placed.  The patient's<BR> femur was elevated from the wound.  Medullary canal was entered, reamed<BR> distally to 11 mm.  An 11.5 and 12 in the transition zone, milled for a<BR> proximal sleeve.  Sleeve  and stem trial with +6 lateral offset were placed.<BR> Anterior greater trochanter was trimmed to enhance stability.  Hip was<BR> stable.  The hip was dislocated.  Trials were removed.  The real sleeve and<BR> stem were impacted into predetermined amount  of anteversion.  The hip ball<BR> was placed and impacted.  The hip was reduced.  Deep wound was irrigated<BR> with pulsatile lavage.  Short rotators and posterior capsule were closed to<BR> the undersurface of the greater trochanter with #2 Vicryl sutures  through<BR> drill holes.<BR> <BR> The medium Hemovac was placed after copious irrigation.  The IT band and<BR> gluteus maximus were closed with #2 Vicryl sutures.  Skin and subcutaneous<BR> were closed 2-0 Vicryl sutures and staples.  Sterile dressing  was applied.<BR> No complications.  The patient was awakened from her sedation and taken to<BR> the recovery room in satisfactory condition.  No specimen.<BR> <BR> <BR> <BR> <BR> E-Signed By<BR> Carlyle Lipa, M.D. 12/21/2008 15:48<BR> Carlyle Lipa, M.D.<BR> <BR> cc:   Carlyle Lipa, M.D.<BR> <BR> <BR> <BR> MD/FI3; D: 12/21/2008 12:04 P; T: 12/21/2008 12:19 P; Doc# [id number]; Job#<BR> [id number]<OZ> <!--EPICE-->

## 2008-12-21 NOTE — Op Note (Signed)
Name: Kathryn [name], Kathryn [name]  MR #: [id number] Surgeon: Carlyle Lipa,   M.D.  Account #: [id number] Surgery Date: 12/21/2008  DOB: 12/26/1935  Age: 73 Location: 5S1 568     OPERATIVE REPORT        PREOPERATIVE DIAGNOSIS: Right hip avascular necrosis.    POSTOPERATIVE DIAGNOSIS: Right hip avascular necrosis.    PROCEDURE: Right total hip replacement.    SURGEON: Carlyle Lipa, MD.    ASSISTANTLadean Raya.    ANESTHETIC: Spinal.    ESTIMATED BLOOD LOSS: 150 mL.    SPECIMENS: None.    DRAINS: Hemovac x1.    IMPLANT: DePuy S-ROM stem, DePuy Pinnacle cup, metal ball and cross-link  polyethylene liner.    COUNTS: Sponge, instrument, and needle count correct at the end of the  procedure.    COMPLICATIONS: None.    PREOPERATIVE ANTIBIOTICS: Ancef.    INDICATIONS: This is a 73 year old woman with intractable hip pain, who  presents for an elective total hip arthroplasty on the right for avascular  necrosis.    DESCRIPTION OF PROCEDURE: Anesthetic was initiated. Preoperative dose of  Ancef was given. Foley catheter was placed. She was turned in the lateral  position. Right lower extremity was prepped and draped in the usual  sterile fashion. The skin was covered completely with Ioban occlusive  dressing.    Posterolateral exposure was made to the patient's hip. Short rotators and  posterior capsule were saved for repair at the end of the procedure.  Straight reamer was placed into the notch and leg length mark was made.  The hip was dislocated. Femoral neck osteotomy was made. Acetabulum was  exposed and progressively reamed to 51 mm. A 52 trial was impacted with  good press fit. This was removed and a 52 pinnacle sector shell was  impacted into the patient's acetabulum in 40 degrees of abduction and in an  anatomic-type anteversion. Hole eliminator and two 6.5 dome screws were  placed to enhance stability.    The real 36 cross-linked polyethylene liner was placed. The patient's   femur was elevated from the wound. Medullary canal was entered, reamed  distally to 11 mm. An 11.5 and 12 in the transition zone, milled for a  proximal sleeve. Sleeve and stem trial with +6 lateral offset were placed.  Anterior greater trochanter was trimmed to enhance stability. Hip was  stable. The hip was dislocated. Trials were removed. The real sleeve and  stem were impacted into predetermined amount of anteversion. The hip ball  was placed and impacted. The hip was reduced. Deep wound was irrigated  with pulsatile lavage. Short rotators and posterior capsule were closed to  the undersurface of the greater trochanter with #2 Vicryl sutures through  drill holes.    The medium Hemovac was placed after copious irrigation. The IT band and  gluteus maximus were closed with #2 Vicryl sutures. Skin and subcutaneous  were closed 2-0 Vicryl sutures and staples. Sterile dressing was applied.  No complications. The patient was awakened from her sedation and taken to  the recovery room in satisfactory condition. No specimen.          E-Signed By  Carlyle Lipa, M.D. 12/21/2008 15:48  Carlyle Lipa, M.D.    cc: Carlyle Lipa, M.D.        MD/FI3; D: 12/21/2008 12:04 P; T: 12/21/2008 12:19 P; Doc# [id number]; Job#  [id number]

## 2008-12-22 LAB — METABOLIC PANEL, BASIC
Anion gap: 8 mmol/L (ref 5–15)
BUN/Creatinine ratio: 19 (ref 12–20)
BUN: 15 MG/DL (ref 6–20)
CO2: 22 MMOL/L (ref 21–32)
Calcium: 8.2 MG/DL — ABNORMAL LOW (ref 8.5–10.1)
Chloride: 109 MMOL/L — ABNORMAL HIGH (ref 97–108)
Creatinine: 0.8 MG/DL (ref 0.6–1.3)
GFR est AA: >60 mL/min/{1.73_m2} (ref >60)
GFR est non-AA: >60 mL/min/{1.73_m2} (ref >60)
Glucose: 120 MG/DL — ABNORMAL HIGH (ref 50–100)
Potassium: 4.2 MMOL/L (ref 3.5–5.1)
Sodium: 139 MMOL/L (ref 136–145)

## 2008-12-22 LAB — PROTHROMBIN TIME + INR
INR: 1 (ref 0.9–1.1)
Prothrombin time: 10.7 SECS (ref 9.0–11.0)

## 2008-12-22 LAB — PTT: aPTT: 25.3 s (ref 24.0–33.0)

## 2008-12-22 LAB — HEMOGLOBIN: HGB: 10.1 g/dL — ABNORMAL LOW (ref 11.5–16.0)

## 2008-12-23 LAB — PROTHROMBIN TIME + INR
INR: 1 (ref 0.9–1.1)
Prothrombin time: 10.9 SECS (ref 9.0–11.0)

## 2008-12-23 LAB — HEMOGLOBIN: HGB: 10.6 g/dL — ABNORMAL LOW (ref 11.5–16.0)

## 2008-12-24 LAB — PROTHROMBIN TIME + INR
INR: 1.2 — ABNORMAL HIGH (ref 0.9–1.1)
Prothrombin time: 12.6 SECS — ABNORMAL HIGH (ref 9.0–11.0)

## 2008-12-24 NOTE — Discharge Summary (Signed)
Name: Kathryn [name], Kathryn [name] Admitted: 12/21/2008  MR #: [id number] Discharged: 12/24/2008  Account #: [id number] DOB: 1935/11/19  Physician: Carlyle Lipa, M.D. Age 73     DISCHARGE SUMMARY      ADMISSION DIAGNOSIS: Right hip avascular necrosis.    DISCHARGE DIAGNOSIS: Right hip avascular necrosis.    PROCEDURE: On 12/21/2008, the patient underwent a right total hip  replacement.    ANESTHESIA: Spinal.    COMPLICATIONS: None.    IMPLANTS USED: DePuy S-ROM stem with a Pinnacle cup and highly  cross-linked polyethylene acetabular liner.    HISTORY OF PRESENT ILLNESS: The patient is a 73 year old female with  progressive right hip pain. After failure of conservative treatment  modalities and increased pain and further avascular necrotic appearance on  radiographs of the hip it was determined she would benefit from a right  total hip replacement. Risks, benefits, alternatives, and potential  complications of the surgery were reviewed with the patient and she  understood them and wished to proceed. On postoperative day #1, the  patient tolerated the procedure well and was transferred to the recovery  room in stable condition. After brief stay she was then transferred to the  joint replacement unit at [facility]. On postoperative day #1,  she was afebrile. Her vital signs were stable. Hemoglobin was 10.1. She  was neurovascularly intact. Incision was healing well without signs of  drainage or dehiscence. She was moving her lower extremities and began  making early progress with physiotherapy. On postoperative day #2, she  continued to improve and hemoglobin was 10.6. Her electrolytes _____ was  unremarkable and within normal limits. On postoperative day #3, she did  well, tolerating a regular diet, progressed with physical therapy, pain  under control. Dressing was clean and dry. She was discharged home with  home health care in stable and improved condition. She was given   medications for pain along with warfarin. She was advised to follow up  with Dr. Adelfa Koh office in 4 weeks. Routine postoperative total hip  precautions were reviewed with her and she understands them well.      Dictated by Shelah Lewandowsky, PA-C          E-Signed By  Carlyle Lipa, M.D. 01/12/2009 13:01    Carlyle Lipa, M.D.    cc: Carlyle Lipa, M.D.        MD/FI3; D: 12/24/2008 10:29 A; T: 12/24/2008 8:33 P; DOC# [id number]; Job#  [id number]

## 2009-02-22 LAB — HEPATITIS B SURFACE ANTIBODY
Hep B S Ab: REACTIVE
Hepatitis B Surface Ag: 22.93 m[IU]/mL

## 2009-02-22 LAB — HEPATIC FUNCTION PANEL
A-G Ratio: 1.1 (ref 1.1–2.2)
ALT (SGPT): 24 U/L (ref 12–78)
AST (SGOT): 12 U/L — ABNORMAL LOW (ref 15–37)
Albumin: 3.4 g/dL — ABNORMAL LOW (ref 3.5–5.0)
Alk. phosphatase: 70 U/L (ref 50–136)
Bilirubin, direct: <0.1 MG/DL (ref 0.0–0.2)
Bilirubin, total: 0.3 MG/DL (ref 0.2–1.0)
Globulin: 3.2 g/dL (ref 2.0–4.0)
Protein, total: 6.6 g/dL (ref 6.4–8.2)

## 2009-02-22 LAB — HEP B SURFACE AB
Hep B surface Ab Interp.: REACTIVE
Hepatitis B surface Ab: 22.93 m[IU]/mL

## 2009-02-23 LAB — HEPATITIS C ANTIBODY
HEPATITIS C AB INDEX, HCAB1: <0.02 IV
HEPATITIS C AB,HCAB: NEGATIVE

## 2009-02-23 LAB — HEPATITIS C AB
Hepatitis C Ab Index: <0.02 IV
Hepatitis C Ab: NEGATIVE

## 2009-02-23 LAB — HEPATITIS A AB, IGM: HEPATITIS A Ab, IgM: NEGATIVE

## 2009-02-23 LAB — HEP B SURFACE AG: Hep B surface Ag: NEGATIVE

## 2009-02-23 LAB — C DIFFICILE TOXIN A & B BY EIA: C. difficile toxin A&B: NEGATIVE

## 2009-02-24 ENCOUNTER — Encounter

## 2009-02-24 MED ORDER — VALSARTAN 160 MG TAB
160 mg | ORAL_TABLET | Freq: Every day | ORAL | Status: DC
Start: 2009-02-24 — End: 2009-02-26

## 2009-02-24 NOTE — Telephone Encounter (Signed)
rx to [facility]

## 2009-02-26 ENCOUNTER — Ambulatory Visit

## 2009-02-26 LAB — METABOLIC PANEL, COMPREHENSIVE
A-G Ratio: 1.1 (ref 1.1–2.2)
ALT (SGPT): 31 U/L (ref 12–78)
AST (SGOT): 14 U/L — ABNORMAL LOW (ref 15–37)
Albumin: 3.7 g/dL (ref 3.5–5.0)
Alk. phosphatase: 71 U/L (ref 50–136)
Anion gap: 8 mmol/L (ref 5–15)
BUN/Creatinine ratio: 17 (ref 12–20)
BUN: 10 MG/DL (ref 6–20)
Bilirubin, total: 0.2 MG/DL (ref 0.2–1.0)
CO2: 29 MMOL/L (ref 21–32)
Calcium: 9.3 MG/DL (ref 8.5–10.1)
Chloride: 106 MMOL/L (ref 97–108)
Creatinine: 0.6 MG/DL (ref 0.6–1.3)
GFR est AA: >60 mL/min/{1.73_m2} (ref >60)
GFR est non-AA: >60 mL/min/{1.73_m2} (ref >60)
Globulin: 3.3 g/dL (ref 2.0–4.0)
Glucose: 94 MG/DL (ref 65–100)
Potassium: 3.8 MMOL/L (ref 3.5–5.1)
Protein, total: 7 g/dL (ref 6.4–8.2)
Sodium: 143 MMOL/L (ref 136–145)

## 2009-02-26 LAB — LIPID PANEL
CHOL/HDL Ratio: 3 (ref 0–5.0)
Cholesterol, total: 198 MG/DL (ref <200)
HDL Cholesterol: 65 MG/DL
LDL, calculated: 110 MG/DL — ABNORMAL HIGH (ref 0–100)
Triglyceride: 115 MG/DL
VLDL, calculated: 23 MG/DL

## 2009-02-26 LAB — TSH 3RD GENERATION: TSH: 1 u[IU]/mL (ref 0.36–3.74)

## 2009-02-26 LAB — CK: CK: 91 U/L (ref 21–215)

## 2009-02-26 MED ORDER — PROPOXYPHENE N-ACETAMINOPHEN 100 MG-650 MG TAB
100-650 mg | ORAL_TABLET | Freq: Four times a day (QID) | ORAL | Status: DC | PRN
Start: 2009-02-26 — End: 2009-08-18

## 2009-02-26 MED ORDER — VALACYCLOVIR 500 MG TAB
500 mg | ORAL_TABLET | Freq: Every day | ORAL | Status: DC
Start: 2009-02-26 — End: 2009-08-18

## 2009-02-26 MED ORDER — RALOXIFENE 60 MG TAB
60 mg | ORAL_TABLET | Freq: Every day | ORAL | Status: DC
Start: 2009-02-26 — End: 2009-08-18

## 2009-02-26 MED ORDER — VALSARTAN 160 MG TAB
160 mg | ORAL_TABLET | Freq: Every day | ORAL | Status: DC
Start: 2009-02-26 — End: 2009-04-22

## 2009-02-26 NOTE — Progress Notes (Signed)
Here for BP check.Had a left  hip replacement in March.

## 2009-02-26 NOTE — Patient Instructions (Signed)
English   Spanish  High Blood Pressure: After Your Visit  Your Care Instructions  If your blood pressure is usually above 140/90, you have high blood pressure, or hypertension. Despite what a lot of people think, high blood pressure usually does not cause headaches or make you feel dizzy or lightheaded. It usually has no symptoms. But it does increase your risk for heart attack, stroke, and kidney or eye damage. The higher your blood pressure, the more your risk increases.  Changes in your lifestyle, such as staying at a healthy weight, may help you lower your blood pressure. Your treatment also will include medicine. If you stop taking your medicine, your blood pressure will go back up.  Follow-up care is a key part of your treatment and safety. Be sure to make and go to all appointments, and call your doctor if you are having problems. It???s also a good idea to know your test results and keep a list of the medicines you take.  How can you care for yourself at home?  Medical treatment  ?? Take your medicine exactly as prescribed. You may take one or more types of medicine to lower your blood pressure. They include diuretics, beta-blockers, ACE inhibitors, calcium channel blockers, angiotensin II receptor blockers, and other medicines. Call your doctor if you think you are having a problem with your medicine.   ?? Your doctor may suggest that you take one low-dose aspirin (81 mg) a day. This can help reduce your risk of having a stroke or heart attack.   ?? See your doctor at least 2 times a year. You may need to see the doctor more often at first or until your blood pressure comes down.   ?? If you are taking blood pressure medicine, talk to your doctor before you take decongestants or anti-inflammatory medicine, such as ibuprofen. Some of these medicines can raise blood pressure.   ?? Learn how to check your blood pressure at home.  Lifestyle changes   ?? Stay at a healthy weight. This is especially important if you put on weight around the waist. Losing even 10 pounds can help you lower your blood pressure.   ?? If your doctor recommends it, get more exercise. Walking is a good choice. Bit by bit, increase the amount you walk every day. Try for at least 30 minutes on most days of the week. You also may want to swim, bike, or do other activities.   ?? Avoid or limit alcohol. Talk to your doctor about whether you can drink any alcohol.   ?? Limit salt.   ?? Eat plenty of fruits (such as bananas and oranges), vegetables, legumes, whole grains, and low-fat dairy products.   ?? Lower the amount of saturated fat in your diet. Saturated fat is found in animal products such as milk, cheese, and meat. Limiting these foods may help you lose weight and also lower your risk for heart disease.   ?? Do not smoke. Smoking increases your risk for heart attack and stroke. If you need help quitting, talk to your doctor about stop-smoking programs and medicines. These can increase your chances of quitting for good.  When should you call for help?  Call 911 anytime you think you may need emergency care. For example, call if:  ?? You have chest pain or pressure. This may occur with:   ?? Sweating.   ?? Shortness of breath.   ?? Nausea or vomiting.   ?? Pain that spreads from   the chest to the neck, jaw, or one or both shoulders or arms.   ?? Dizziness or lightheadedness.   ?? A fast or uneven pulse.  After calling 911, chew 1 adult-strength aspirin. Wait for an ambulance. Do not try to drive yourself.   ?? You have signs of a stroke. These include:   ?? Sudden numbness, paralysis, or weakness in your face, arm, or leg, especially on only one side of your body.   ?? New problems with walking or balance.   ?? Sudden vision changes.   ?? New problems speaking or understanding simple statements, or feeling confused.   ?? A sudden, severe headache that is different from past headaches.   Call your doctor now or seek immediate medical care if:  ?? Your blood pressure rises suddenly.   ?? Your blood pressure is 180/110 or higher.   ?? You are dizzy or lightheaded, or you feel like you may faint.   ?? Your blood pressure is higher than 140/90 on two or more occasions.  Watch closely for changes in your health, and be sure to contact your doctor if:  ?? You do not get better as expected.   Where can you learn more?   Go to [email or web address]  Enter [phone] in the search box to learn more about "High Blood Pressure: After Your Visit".   ?? 2006-2010 [employer]. Care instructions adapted under license by [employer] (which disclaims liability or warranty for this information). This care instruction is for use with your licensed healthcare professional. If you have questions about a medical condition or this instruction, always ask your healthcare professional. Healthwise disclaims any warranty or liability for your use of this information.English   Spanish  High Cholesterol: After Your Visit  Your Care Instructions  Cholesterol is a type of fat in your blood. It is needed for many body functions, such as making new cells. Cholesterol is made by your body and also comes from food you eat. High cholesterol means you have too much of the fat in your blood.  There are two types of cholesterol: LDL and HDL. LDL is the "bad" cholesterol that builds up inside the blood vessel walls, making them too narrow. This reduces the flow of blood and can cause a heart attack or stroke. HDL is the "good" cholesterol that helps clear bad cholesterol from the body. You want your good cholesterol to be high and your bad cholesterol to be low. If you do this, you can reduce your chance of having a heart attack or a stroke.   You can improve your cholesterol levels by eating less animal and trans fat and more vegetables. Getting regular exercise can also help. But for some people, cholesterol problems run in the family. If changes in diet and exercise do not improve your cholesterol levels, talk to your doctor about using medicine.  Follow-up care is a key part of your treatment and safety. Be sure to make and go to all appointments, and call your doctor if you are having problems. It???s also a good idea to know your test results and keep a list of the medicines you take.  How can you care for yourself at home?  ?? Eat a variety of foods every day. Good choices include fruits, vegetables, whole grains (like oatmeal), dried beans and peas, nuts and seeds, soy products (like tofu), and fat-free or low-fat dairy products.   ?? Replace butter, margarine, and hydrogenated or partially hydrogenated oils with olive  and canola oils. (Canola oil margarine without trans fat is fine.)   ?? Replace red meat with fish, poultry, and soy protein (like tofu).   ?? Limit processed and packaged foods like chips, crackers, and cookies.   ?? Bake, broil, or steam foods instead of frying them.   ?? Limit foods high in cholesterol, such as egg yolks.   ?? Be physically active. Exercise increases your HDL, or good cholesterol level. Get at least 30 minutes of exercise on most days of the week. Walking is a good choice. You also may want to do other activities, such as running, swimming, cycling, or playing tennis or team sports.   ?? Stay at a healthy weight or lose weight by making the changes in eating and physical activity presented above. Losing just a small amount of weight, even 5 to 10 pounds, can reduce your risk for having a heart attack or stroke.    ?? Do not smoke. Smoking can increase the chance you will have a heart attack. If you need help quitting, talk to your doctor about stop-smoking programs and medicines. These can increase your chances of quitting for good.   ?? If you are taking medicine for high cholesterol, be sure to take it every day.  When should you call for help?  Call your doctor now or seek immediate medical care if:  ?? You are taking cholesterol medicine and think you have side effects. These may include fatigue, upset stomach, gas, constipation, and pain or cramps in the belly. Report any muscle pain right away.  Watch closely for changes in your health, and be sure to contact your doctor if:  ?? You want help in making diet and exercise changes.   ?? You are worried about your cholesterol level.   ?? You have family members with very high cholesterol levels.   Where can you learn more?   Go to [email or web address]  Enter [phone] in the search box to learn more about "High Cholesterol: After Your Visit".   ?? 2006-2010 [employer]. Care instructions adapted under license by [employer] (which disclaims liability or warranty for this information). This care instruction is for use with your licensed healthcare professional. If you have questions about a medical condition or this instruction, always ask your healthcare professional. [employer]wise disclaims any warranty or liability for your use of this information.English   Spanish  Hip Arthritis: After Your Visit  Your Care Instructions  Arthritis, also called osteoarthritis, is a breakdown of the tissue (cartilage) that cushions your joints. Many people have some arthritis as they age. When the cartilage in your hip joints wears down, your hip bone rubs against the hip socket. This causes pain and stiffness.   Work with your doctor to find the right mix of treatments for your arthritis. There are things you can do at home to protect your hip joints, ease your pain, and help you stay active. But if your arthritis becomes so bad that you cannot walk, you may need surgery to replace the hip joint.  Follow-up care is a key part of your treatment and safety. Be sure to make and go to all appointments, and call your doctor if you are having problems. It???s also a good idea to know your test results and keep a list of the medicines you take.  How can you care for yourself at home?  ?? Stay at a healthy weight. Being overweight puts extra strain on your hip joints.   ??  Talk to your doctor or physical therapist about exercises that will help ease hip pain. These tips may help:   ?? Stretch to help prevent stiffness and to prevent injury before you exercise. You may enjoy gentle forms of yoga to help keep your joints and muscles flexible.   ?? Walk instead of jog. Other types of exercise that are less stressful on the joints include riding a bike, swimming, and doing water exercise.   ?? Lift weights. Strong muscles help reduce stress on your joints. Stronger thigh muscles, for example, take some of the stress off of the knees and hips. Learn the right way to lift weights so you do not make joint pain worse.  ?? Take pain medicines exactly as directed.   ?? If the doctor gave you a prescription medicine for pain, take it as prescribed.   ?? If you are not taking a prescription pain medicine, ask your doctor if you can take an over-the-counter medicine.   ?? Do not take two or more pain medicines at the same time unless the doctor told you to. Many pain medicines have acetaminophen, which is Tylenol. Too much acetaminophen (Tylenol) can be harmful.  ?? Use a cane, crutch, walker, or another device if you need help to get around. These can help rest your hips. You also can use other things to make life easier, such as a higher toilet seat.    ?? Do not sit in low chairs, which can make it painful to get up.   ?? Put heat or cold on your sore hips as needed. Use whichever helps you most. You also can go back and forth between hot and cold packs.   ?? Apply heat 2 or 3 times a day for 20 to 30 minutes???using a heating pad, hot shower, or hot pack???to relieve pain and stiffness.   ?? Put ice or a cold pack on your sore hips for 10 to 15 minutes at a time to numb the area. Put a thin cloth between the ice and your skin.  ?? Think about talking to your doctor about using capsaicin, a cream you apply to the skin for pain relief.  When should you call for help?  Call your doctor now or seek immediate medical care if:  ?? You have sudden swelling, warmth, or pain in any joint.   ?? You have joint pain and a fever or rash.   ?? You have such bad pain that you cannot use the joint.  Watch closely for changes in your health, and be sure to contact your doctor if:  ?? You have mild joint symptoms that continue even with more than 6 weeks of care at home.   ?? You do not get better as expected.   ?? You have stomach pain or other problems with your medicine.   Where can you learn more?   Go to [email or web address]  Enter T640 in the search box to learn more about "Hip Arthritis: After Your Visit".   ?? 2006-2010 [employer]. Care instructions adapted under license by [employer] (which disclaims liability or warranty for this information). This care instruction is for use with your licensed healthcare professional. If you have questions about a medical condition or this instruction, always ask your healthcare professional. Healthwise disclaims any warranty or liability for your use of this information.English   Spanish  Statins: After Your Visit  Your Care Instructions   Statins are medicines that lower your LDL cholesterol levels.  Cholesterol is a type of fat in your blood. LDL cholesterol is the "bad" cholesterol that builds up inside the blood vessel walls, making them too narrow. This can block blood flow to the heart or brain and cause a heart attack or stroke. LDL cholesterol also increases your total cholesterol levels.  You can improve your cholesterol levels by making lifestyle changes, such as eating less animal fat and getting regular exercise. But for some people, this is not enough. You need medicines to lower your cholesterol.  Statins lower the risk of heart attack, stroke, and death for people who have heart disease or are at high risk for heart disease. They do this by blocking how much cholesterol your body makes. You must take statin medicines every day for them to work well. If you stop, your cholesterol will go back up.  You need regular blood tests while you are taking statins. Statins can cause liver problems, and the blood tests check how well your liver is working.  Statins interact with many medicines, so also tell your doctor which medicines you take, including gemfibrozil, fenofibrate, niacin, ketoconazole, or erythromycin.  Examples of statins include lovastatin (Mevacor), pravastatin (Pravachol), and simvastatin (Zocor).  Follow-up care is a key part of your treatment and safety. Be sure to make and go to all appointments, and call your doctor if you are having problems. It???s also a good idea to know your test results and keep a list of the medicines you take.  How can you care for yourself at home?  ?? Take statins exactly as your doctor tells you. Since high cholesterol has no symptoms, it is easy to forget to take the pills. Try to make a system that reminds you to take them.   ?? Do not take two or more medicines at the same time unless the doctor told you to. Statins can interact with other medicines.    ?? Always tell your doctor if you think you are having a side effect. If side effects are a problem with one medicine, a different one may be used.   ?? Continue the lifestyle changes your doctor suggests. These can include eating less animal and trans fat, eating more fruits and vegetables, and getting more exercise.   ?? Talk to your doctor about avoiding grapefruit juice if you are taking statins. Grapefruit juice can raise the level of this medicine in your blood. This could increase side effects.  When should you call for help?  Call your doctor now or seek immediate medical care if:  ?? You have severe muscle pain, weakness, or brown urine.  Watch closely for changes in your health, and be sure to contact your doctor if:  ?? You have side effects of statins. These include:   ?? Fatigue.   ?? Upset stomach.   ?? Gas.   ?? Constipation.   ?? Pain or cramps in the belly.   ?? Muscle aches.  ?? You have any new symptoms or side effects.   Where can you learn more?   Go to [email or web address]  Enter R358 in the search box to learn more about "Statins: After Your Visit".   ?? 2006-2010 [employer]. Care instructions adapted under license by [employer] (which disclaims liability or warranty for this information). This care instruction is for use with your licensed healthcare professional. If you have questions about a medical condition or this instruction, always ask your healthcare professional. [employer]wise disclaims any warranty or liability for your use  of this information.English   Spanish  Statins: After Your Visit  Your Care Instructions   Statins are medicines that lower your LDL cholesterol levels. Cholesterol is a type of fat in your blood. LDL cholesterol is the "bad" cholesterol that builds up inside the blood vessel walls, making them too narrow. This can block blood flow to the heart or brain and cause a heart attack or stroke. LDL cholesterol also increases your total cholesterol levels.  You can improve your cholesterol levels by making lifestyle changes, such as eating less animal fat and getting regular exercise. But for some people, this is not enough. You need medicines to lower your cholesterol.  Statins lower the risk of heart attack, stroke, and death for people who have heart disease or are at high risk for heart disease. They do this by blocking how much cholesterol your body makes. You must take statin medicines every day for them to work well. If you stop, your cholesterol will go back up.  You need regular blood tests while you are taking statins. Statins can cause liver problems, and the blood tests check how well your liver is working.  Statins interact with many medicines, so also tell your doctor which medicines you take, including gemfibrozil, fenofibrate, niacin, ketoconazole, or erythromycin.  Examples of statins include lovastatin (Mevacor), pravastatin (Pravachol), and simvastatin (Zocor).  Follow-up care is a key part of your treatment and safety. Be sure to make and go to all appointments, and call your doctor if you are having problems. It???s also a good idea to know your test results and keep a list of the medicines you take.  How can you care for yourself at home?  ?? Take statins exactly as your doctor tells you. Since high cholesterol has no symptoms, it is easy to forget to take the pills. Try to make a system that reminds you to take them.   ?? Do not take two or more medicines at the same time unless the doctor told you to. Statins can interact with other medicines.    ?? Always tell your doctor if you think you are having a side effect. If side effects are a problem with one medicine, a different one may be used.   ?? Continue the lifestyle changes your doctor suggests. These can include eating less animal and trans fat, eating more fruits and vegetables, and getting more exercise.   ?? Talk to your doctor about avoiding grapefruit juice if you are taking statins. Grapefruit juice can raise the level of this medicine in your blood. This could increase side effects.  When should you call for help?  Call your doctor now or seek immediate medical care if:  ?? You have severe muscle pain, weakness, or brown urine.  Watch closely for changes in your health, and be sure to contact your doctor if:  ?? You have side effects of statins. These include:   ?? Fatigue.   ?? Upset stomach.   ?? Gas.   ?? Constipation.   ?? Pain or cramps in the belly.   ?? Muscle aches.  ?? You have any new symptoms or side effects.   Where can you learn more?   Go to [email or web address]  Enter R358 in the search box to learn more about "Statins: After Your Visit".   ?? 2006-2010 [employer]. Care instructions adapted under license by [employer] (which disclaims liability or warranty for this information). This care instruction is for use with your licensed healthcare  professional. If you have questions about a medical condition or this instruction, always ask your healthcare professional. [employer]wise disclaims any warranty or liability for your use of this information.

## 2009-02-26 NOTE — Progress Notes (Signed)
HISTORY OF PRESENT ILLNESS  Kathryn [name] is a 73 y.o. female presents with Hypertension, Medication Refill and Results    Agree with nurse note.  Pt wants 90 day Rxs.  She is tolerating her meds well.  Patient denies vision changes, headaches, dizziness, chest pain, SOB, or swelling.    She had Right THR by Dr. Philomena Doheny and has Left THR scheduled in 05/2009.  She recovered well.  She is requesting a refill of Darvocet for the Left hip OA for up to BID prn.    She is fasting and wants labwork.    ROS    Review of Systems negative except as noted above in HPI.    PHYSICAL EXAMINATION    Vital Signs  BP 130/80   Pulse 79   Temp(Src) 97 ??F (36.1 ??C) (Oral)   Wt 176 lb 3.2 oz (79.924 kg)   SpO2 99%    General appearance - Well nourished. Well appearing.  Well developed.  No acute distress. Overweight.   Head - Normocephalic.  Atraumatic.  Non tender sinuses x 4.  Eyes - pupils equal and reactive, extraocular eye movements intact, sclera anicteric.  Mildly injected sclera.  Ears - Hearing is grossly normal bilaterally.    Nose - normal and patent, no erythema, discharge or polyps   Mouth - mucous membranes moist.  Posterior pharynx normal with cobblestone appearance.  No erythema, white exudate or obstruction.  Neck - supple, no significant adenopathy.  No carotid bruits are noted.  No thyromegaly noted.  Chest - clear to auscultation bilaterally anterriorly and posteriorly.  No wheezes.  No rales or rhonchi.  Breath sounds are symmetrical bilaterally.  Unlabored respirations.  Heart - normal rate.  Regular rhythm.  Normal S1, S2.  No murmur.  No rubs, clicks or gallops noted.  Abdomen - soft and nondistended.  No masses or organomegaly.  No rebound, rigidity or guarding.  Bowel sounds normal x 4 quadrants.  No tenderness noted.  Back exam - normal range of motion.  No pain on palpation of the spinous processes in the cervical, thoracic, lumbar, sacral regions.  No CVA tenderness.     Neurological - awake, alert and oriented to person, place, and time and event.  Cranial nerves II through XII intact.  Muscle strength is +5/5 x 4 extremities.  Sensation is intact to light touch bilaterally.  Steady gait.   Musculoskeletal - Intact x 4 extremities.  Full ROM x 4 extremities.  No pain with movement.    Heme/Lymph - peripheral pulses normal x 4 extremities.  No peripheral edema is noted.  No cervical adenopathy noted.  Skin - no rashes, erythema, ecchymosis, lacerations, abrasions, suspicious moles noted.  Right hip vertical incision intact.  Psychological -   normal behavior, dress and thought processes.  Good insight. Good eye contact.  Normal affect.  Happy mood.  Normal speech.    ASSESSMENT and PLAN    1. Pure hypercholesterolemia (272.0)  LIPID PANEL, METABOLIC PANEL, COMPREHENSIVE, VITAMIN D, 25 HYDROXY, CK   2. 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 Essential hypertension, benign (401.1)  valsartan (DIOVAN) 160 mg tablet, METABOLIC PANEL, COMPREHENSIVE, TSH, 3RD GENERATION, VITAMIN D, 25 HYDROXY, URINALYSIS W/ RFLX MICROSCOPIC   4. 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 Low back pain (724.2A)  raloxifene (EVISTA) 60 mg tablet       cardiovascular  risk and specific lipid/LDL goals reviewed  use of aspirin to prevent MI and TIA's discussed  Continue current medications and care.    Prescriptions written and given to pt.  Medication side effects discussed.  Discussed test results and goals with patient.  Recheck pertinent labs today.  Get recent office visit notes from Dr. Philomena Doheny.  Referrals given.  Keep appointments with specialists.  Addressed weight, diet and exercise with patient.  Counseled patient on: health concerns  Relevant handouts given and discussed with patient.  Immunizations noted.    Praised pt for progress.     Return to office in 6 mos for bp, results or sooner if symptoms worsen or persist.    More than 30 mins spent with patient with more than 50% of this time spent in counseling or coordinating care.

## 2009-02-27 LAB — URINE MICROSCOPIC ONLY

## 2009-02-27 LAB — URINALYSIS W/ RFLX MICROSCOPIC
Bilirubin: NEGATIVE
Glucose: NEGATIVE MG/DL
Ketone: NEGATIVE MG/DL
Nitrites: NEGATIVE
Protein: 30 MG/DL — AB
Specific gravity: 1.015 (ref 1.003–1.030)
Urobilinogen: 0.2 EU/DL (ref 0.2–1.0)
pH (UA): 7.5 (ref 5.0–8.0)

## 2009-02-28 LAB — VITAMIN D, 25 HYDROXY: Vitamin D 25-Hydroxy: 25 ng/mL — ABNORMAL LOW (ref 30–80)

## 2009-03-10 ENCOUNTER — Encounter

## 2009-03-10 MED ORDER — TRIMETHOPRIM-SULFAMETHOXAZOLE 160 MG-800 MG TAB
160-800 mg | ORAL_TABLET | Freq: Two times a day (BID) | ORAL | Status: AC
Start: 2009-03-10 — End: 2009-03-15

## 2009-03-10 NOTE — Progress Notes (Signed)
Quick Note:    Will treat for UTI, repeat in 1 month for test of cure  CMP Ok  Lipids elevated but lower than in past--keep on doing what she is doing  Ck, TSH normal  Vit d low, needs supplementation 400 international units daily    ______

## 2009-03-10 NOTE — Progress Notes (Addendum)
Advised home machine to p/up Rx and call tomorrow for rest of lab report.

## 2009-03-11 NOTE — Progress Notes (Signed)
Quick Note:    Left msg last pm to p/up Rx for UTI.Mailed copy of labs.  ______

## 2009-04-09 ENCOUNTER — Encounter

## 2009-04-12 ENCOUNTER — Ambulatory Visit

## 2009-04-12 LAB — URINALYSIS W/ RFLX MICROSCOPIC
Bilirubin: NEGATIVE
Glucose: NEGATIVE MG/DL
Ketone: NEGATIVE MG/DL
Nitrites: NEGATIVE
Specific gravity: 1.025 (ref 1.003–1.030)
Urobilinogen: 0.2 EU/DL (ref 0.2–1.0)
pH (UA): 7 (ref 5.0–8.0)

## 2009-04-13 MED ORDER — TRIMETHOPRIM-SULFAMETHOXAZOLE 160 MG-800 MG TAB
160-800 mg | ORAL_TABLET | Freq: Two times a day (BID) | ORAL | Status: AC
Start: 2009-04-13 — End: 2009-04-23

## 2009-04-13 NOTE — Telephone Encounter (Signed)
States is having pressure with urination.Would like treatment, as is going OOT tomorrow am.

## 2009-04-13 NOTE — Progress Notes (Signed)
Quick Note:    States is having pressure with urination.Would like treatment, as is going OOT tomorrow am.  ______

## 2009-04-13 NOTE — Telephone Encounter (Signed)
Bactrim ordered and escribed

## 2009-04-13 NOTE — Progress Notes (Signed)
Quick Note:    Infection improved compared with previous results, suspect some element of dirty urine. Continue current measures. Will treat if symptomatic  ______

## 2009-04-13 NOTE — Progress Notes (Signed)
Quick Note:    No it is to late. sorry  ______

## 2009-04-13 NOTE — Telephone Encounter (Signed)
Advised pt's home phone.

## 2009-04-22 ENCOUNTER — Encounter

## 2009-04-22 MED ORDER — VALSARTAN 160 MG TAB
160 mg | ORAL_TABLET | Freq: Every day | ORAL | Status: DC
Start: 2009-04-22 — End: 2009-08-18

## 2009-04-22 NOTE — Telephone Encounter (Signed)
Refill sent

## 2009-04-23 LAB — H PYLORI, IGG, QL

## 2009-04-24 LAB — HIV-1 RNA QL
HIV 1 Ab: NEGATIVE
HIV1 ANTIBODY,HIVR: NEGATIVE

## 2009-04-25 LAB — HTLV I-II: HTLV I/II Abs: NEGATIVE

## 2009-04-30 LAB — CRYPTOSPORIDIUM AG: Cryptosporidium Ag: NEGATIVE

## 2009-05-02 LAB — MISC. LAB TEST

## 2009-05-02 LAB — MICROSPORIDIA STAIN: Microsporidia stain: NEGATIVE

## 2009-05-02 LAB — CULTURE, STOOL

## 2009-05-03 LAB — OVA & PARASITES, STOOL
O&P, Trichrome stain: NEGATIVE
Ova & Parasite exam: NEGATIVE

## 2009-05-10 LAB — MISC. LAB TEST: Results:: NEGATIVE

## 2009-05-24 NOTE — Progress Notes (Signed)
Kathryn [name] is a 73 y.o. female patient who presents for a Pre-Op Examination.    Agree with Nurse history.    HISTORY OF PRESENT ILLNESS    She has been scheduled for Left total hip replacement surgery on June 14, 2009 by Dr. Birdena Crandall.    Pt has had bilateral hip pain, worse x 1 year.  Seen by Dr. Philomena Doheny.  Advised to have THR.  Tolerated the Right THR in 12/2008 well.  Now needs the Left hip surgery.  Left hip pain worsens with walking.  Pt takes Darvocet daily with minimal relief.    ROS     ROS is negative except as mentioned in the HPI.    Previous intolerance to Anesthesia? no    Latex Allergies?  no    ALLERGIES:  Allergies   Allergen Reactions   ??? Penicillins Rash and Swelling   ??? Other Rash     All cillins   ??? Zocor (Simvastatin) Other (comments)     Leg cramps   ??? Lipitor (Atorvastatin) Other (comments)     Leg cramps   ??? Niaspan (Niacin) Other (comments)     Caused elevated BP   ??? Crestor (Rosuvastatin) Other (comments)     Elevated CPK         CURRENT MEDICATIONS:  Current outpatient prescriptions:valsartan (DIOVAN) 160 mg tablet, Take 1 Tab by mouth daily. For blood pressure, Disp: 30 Tab, Rfl: 3;  propoxyphene napsylate-acetaminophen (DARVOCET-N 100) 100-650 mg per tablet, Take 1 Tab by mouth every six (6) hours as needed for Pain., Disp: 180 Tab, Rfl: 0;  valacyclovir (VALTREX) 500 mg tablet, Take 1 Tab by mouth daily. For HSV, Disp: 90 Tab, Rfl: 1  raloxifene (EVISTA) 60 mg tablet, Take 1 Tab by mouth daily. For bones, Disp: 90 Tab, Rfl: 3;  pravastatin (PRAVACHOL) 80 mg tablet, Take 1 Tab by mouth daily for 360 days. For cholesterol, Disp: 90 Tab, Rfl: 3;  furosemide (LASIX) 20 mg tablet, Take 1 Tab by mouth as needed. For edema, Disp: 30 Tab, Rfl: 2;  CLOBEX 0.05 % Sham, by Apply Externally route., Disp: , Rfl:     PAST MEDICAL HISTORY:  Past Medical History   Diagnosis Date   ??? Edema      left ankle   ??? DDD (degenerative disc disease) 10/1999      L 4-5 by MRI.  Dr. Shiela Mayer.  Dr. Robie Ridge, Pain   ??? Urinary incontinence, stress      mild.  Dr. Winnifred Friar   ??? Polyp of rectum      Dr. Reed Pandy   ??? Cystocele      Dr. Winnifred Friar   ??? Postmenopausal HRT (hormone replacement therapy)      Hx  for 6 years.  Dr. Lavone Nian   ??? Morton's neuroma 2000     Left.  Dr. Gillis Santa   ??? Hematuria of undiagnosed cause 1980, 2009   ??? Single renal cyst 02/12/2008     Left.  0.9 cm   ??? Subchondral cysts 2009     Left hip femoral heads.  Dr. Adah Perl   ??? DJD (degenerative joint disease) 05/26/04     Dr. Shiela Mayer.   ??? Hypercholesterolemia    ??? Hypertension    ??? Kidney stone 12/2002     Dr. Manson Passey   ??? Chickenpox      childhood   ??? DJD (degenerative joint disease) of hip 2009     Dr.  Dobyziak.     ??? Rotator cuff tendinitis 12/20/04     Right.  Dr. Jen Mow   ??? Benign positional vertigo 01/07/04     Dr. Minette Headland   ??? SOB (shortness of breath) 07/11/04     Dr. Abigail Miyamoto.  Neg Stress Cardiolite.  EF 60 %.   ??? Unspecified vitamin D deficiency 2008   ??? Herpes zoster 02/26/07, 06/21/07, 08/27/07     Left buttocks then Right T2-3 dermatome   ??? Herpes simplex type 2 infection 10/05/08     Left buttocks         PAST SURGICAL HISTORY:  Past Surgical History   Procedure Date   ??? Hx cyst removal 1970s     Rt breast then Left.  benign.   ??? Hx bunionectomy 1970s, 2003     Bilateral.  Dr. Stephannie Li.   ??? Hx hysterectomy 1988     with BSO   ??? Foot/toes surgery proc unlisted 2000     Left Morton's Neuroma.  Dr. Stephannie Li   ??? Hx hip replacement 12/21/08     Dr. Philomena Doheny   Right         FAMILY HISTORY:  Family History   Problem Relation   ??? Diabetes Mother   ??? Heart Attack Mother   ??? Cancer Father     pancreatic   ??? Cancer Brother     bile duct cancer   ??? Heart Attack Sister     age 32 y/o   ??? Arthritis-osteo Sister   ??? Asthma Sister         SOCIAL HISTORY:  History   Social History   ??? Marital Status: Married     Spouse Name: N/A     Number of Children: N/A    ??? Years of Education: N/A   Social History Main Topics   ??? Tobacco Use: Quit -- 0.5 packs/day for 10 years     Quit date: 10/17/1987   ??? Alcohol Use: No   ??? Drug Use: No   ??? Sexually Active: Yes -- Female partner(s)     Birth Control/ Protection: None   Other Topics Concern   ??? Not on file   Social History Narrative   ??? No narrative on file         IMMUNIZATIONS:  Immunization History   Administered Date(s) Administered   ??? H1N1 Influenza Virus Vaccine 09/15/2008   ??? Influenza Vaccine Whole 06/16/2008   ??? Pneumococcal Vaccine 10/16/1998   ??? TD Vaccine 01/06/2003         PHYSICAL EXAMINATION    Vital Signs BP 130/80   Pulse 52   Temp(Src) 98 ??F (36.7 ??C) (Oral)   Ht 5' 2.25" (1.581 m)   Wt 170 lb 6.4 oz (77.293 kg)   SpO2 97%    General appearance - Well nourished. Well appearing.  Well developed.  Overweight.   Head - Normocephalic.  Atraumatic.  Non tender sinuses x 4.  Eyes - pupils equal and reactive.  Extraocular eye movements intact.  Sclera anicteric.  Ears - External ear canals normal without evidence of blood or swelling.   Hearing is grossly normal bilaterally  Nose - normal and patent, no erythema, discharge or polyps   Mouth - mucous membranes moist, pharynx normal without lesions  Neck - supple, no significant adenopathy. No carotid bruits are noted bilaterally.  No thyromegaly noted.  Chest - clear to auscultation bilaterally anterriorly and posteriorly.  No wheezes.  No rales  or rhonchi.  Breath sounds are symmetrical.  Respirations are unlabored.  Heart - normal rate.  Regular rhythm. Normal S1, S2.  No murmurs, rubs, clicks or gallops noted.  Abdomen - soft and nondistended.  No masses or organomegaly.  No rebound, rigidity or guarding.  Bowel sounds normal x 4 quadrants.  No tenderness noted.  Back exam - normal range of motion.  No pain on palpation of the spinous processes in the cervical, thoracic, lumbar, sacral regions.  No CVA tenderness.   Neurological - awake, alert and oriented to person, place, and time and event.  Cranial nerves II through XII intact  Normal speech.  No focal findings.  Muscle strength is +5/5 x 4 extremities.  Sensation is intact to light touch bilaterally.  Steady gait.    Musculoskeletal - Intact x 4 extremities.  Full ROM x 4 extremities.  Left hip pain with movement.   No tenderness in the pelvis, pubic bone, bilateral hips, knees, ankles.  No obvious deformity or swelling  Heme/Lymph - peripheral pulses normal x 4 extremities.  No peripheral edema is noted.   Skin - no rashes, erythema, ecchymosis, lacerations, abrasions.  Right hip incision intact.  Psychological -   normal behavior, speech, dress and thought processes.  Good insight. Good eye contact.  Normal affect.  Normal mood.    ASSESSMENT/PLAN  1. Preop examination (V72.84B)    2. Hypertension (401.9AJ)    3. Left hip pain (719.45L)          BP is stable.  Rechecked pulse 70 bpm.  Continue current medications, care and follow up with specialists.  Previous labs reviewed.  Negative HIV test by Dr. Boyce Medici noted.  Per pt request, will monitor weight and re-evaluate at her next appt.    Based upon the examination performed today, no contraindications have been noted and Sena Hitch is an acceptable risk for the requested proposed procedure.  Please proceed with the surgery as scheduled.          _____________________________________    Lucy Chris Harle Stanford, D.O.  [facility]  [address]  [place], [place]  [address]  [phone] phone  [phone] fax  Nurse:  Catarina Hartshorn

## 2009-05-24 NOTE — Progress Notes (Signed)
Here for pre op PE for Left hip replacement on 06/14/09 by Dr. Philomena Doheny.Feels fine.

## 2009-05-28 LAB — HSV CULTURE WITHOUT TYPING

## 2009-06-02 LAB — CBC WITH AUTOMATED DIFF
ABS. BASOPHILS: 0 10*3/uL (ref 0.0–0.1)
ABS. EOSINOPHILS: 0.1 10*3/uL (ref 0.0–0.4)
ABS. LYMPHOCYTES: 2.3 10*3/uL (ref 0.8–3.5)
ABS. MONOCYTES: 0.6 10*3/uL (ref 0.0–1.0)
ABS. NEUTROPHILS: 4.5 10*3/uL (ref 1.8–8.0)
BASOPHILS: 0 % (ref 0–1)
EOSINOPHILS: 1 % (ref 0–7)
HCT: 41.1 % (ref 35.0–47.0)
HGB: 13.6 g/dL (ref 11.5–16.0)
LYMPHOCYTES: 31 % (ref 12–49)
MCH: 29.4 PG (ref 26.0–34.0)
MCHC: 33.1 g/dL (ref 30.0–36.5)
MCV: 89 FL (ref 80.0–99.0)
MONOCYTES: 8 % (ref 5–13)
NEUTROPHILS: 60 % (ref 32–75)
PLATELET: 281 10*3/uL (ref 150–400)
RBC: 4.62 M/uL (ref 3.80–5.20)
RDW: 14.7 % — ABNORMAL HIGH (ref 11.5–14.5)
WBC: 7.5 10*3/uL (ref 3.6–11.0)

## 2009-06-02 LAB — METABOLIC PANEL, COMPREHENSIVE
A-G Ratio: 1.2 (ref 1.1–2.2)
ALT (SGPT): 28 U/L (ref 12–78)
AST (SGOT): 17 U/L (ref 15–37)
Albumin: 3.8 g/dL (ref 3.5–5.0)
Alk. phosphatase: 68 U/L (ref 50–136)
Anion gap: 8 mmol/L (ref 5–15)
BUN/Creatinine ratio: 19 (ref 12–20)
BUN: 13 MG/DL (ref 6–20)
Bilirubin, total: 0.4 MG/DL (ref 0.2–1.0)
CO2: 29 MMOL/L (ref 21–32)
Calcium: 9.5 MG/DL (ref 8.5–10.1)
Chloride: 104 MMOL/L (ref 97–108)
Creatinine: 0.7 MG/DL (ref 0.6–1.3)
GFR est AA: >60 mL/min/{1.73_m2} (ref >60)
GFR est non-AA: >60 mL/min/{1.73_m2} (ref >60)
Globulin: 3.2 g/dL (ref 2.0–4.0)
Glucose: 83 MG/DL (ref 65–100)
Potassium: 4.2 MMOL/L (ref 3.5–5.1)
Protein, total: 7 g/dL (ref 6.4–8.2)
Sodium: 141 MMOL/L (ref 136–145)

## 2009-06-02 LAB — PROTHROMBIN TIME + INR
INR: 1 (ref 0.9–1.1)
Prothrombin time: 10.9 SECS (ref 9.0–11.0)

## 2009-06-02 LAB — URINALYSIS W/ REFLEX CULTURE
Bacteria: NEGATIVE /HPF
Bilirubin: NEGATIVE
Glucose: NEGATIVE MG/DL
Ketone: NEGATIVE MG/DL
Nitrites: NEGATIVE
Protein: NEGATIVE MG/DL
Specific gravity: 1.019 (ref 1.003–1.030)
Urobilinogen: 0.2 EU/DL (ref 0.2–1.0)
pH (UA): 6 (ref 5.0–8.0)

## 2009-06-02 LAB — PTT: aPTT: 28.4 s (ref 24.0–33.0)

## 2009-06-04 LAB — CULTURE, URINE
Colonies Counted: >100000
Colony Count: >100000

## 2009-06-15 LAB — PROTHROMBIN TIME + INR
INR: 1.1 (ref 0.9–1.1)
Prothrombin time: 11 SECS (ref 9.0–11.0)

## 2009-06-15 LAB — PTT: aPTT: 25.6 s (ref 24.0–33.0)

## 2009-06-15 LAB — METABOLIC PANEL, BASIC
Anion gap: 8 mmol/L (ref 5–15)
BUN/Creatinine ratio: 16 (ref 12–20)
BUN: 11 MG/DL (ref 6–20)
CO2: 27 MMOL/L (ref 21–32)
Calcium: 8.2 MG/DL — ABNORMAL LOW (ref 8.5–10.1)
Chloride: 106 MMOL/L (ref 97–108)
Creatinine: 0.7 MG/DL (ref 0.6–1.3)
GFR est AA: >60 mL/min/{1.73_m2} (ref >60)
GFR est non-AA: >60 mL/min/{1.73_m2} (ref >60)
Glucose: 142 MG/DL — ABNORMAL HIGH (ref 65–100)
Potassium: 4.1 MMOL/L (ref 3.5–5.1)
Sodium: 141 MMOL/L (ref 136–145)

## 2009-06-15 LAB — HGB & HCT
HCT: 31.4 % — ABNORMAL LOW (ref 35.0–47.0)
HGB: 10.3 g/dL — ABNORMAL LOW (ref 11.5–16.0)

## 2009-06-15 NOTE — Op Note (Signed)
Op Notes filed by Birdena Crandall, MD at 06/29/09 1256                Author: Birdena Crandall, MD  Service: --  Author Type: Physician       Filed: 06/29/09 1256  Date of Service: 06/15/09 0158  Status: Addendum          Editor: Birdena Crandall, MD (Physician)          <!--EPICS--> Name:      Kathryn [name], Kathryn [name]<BR> MR #:      [id number]                    Surgeon:        Carlyle Lipa, <BR> M.D.<BR> Account #:  [id number]                 Surgery Date:   06/14/2009<BR> DOB:       December 10, 1935<BR> Age:       73                           Location:       5S1 574<BR> <BR>                              OPERATIVE REPORT<BR> <BR> <BR> PREOPERATIVE DIAGNOSIS:  Osteoarthritis,  left hip.<BR> <BR> POSTOPERATIVE DIAGNOSIS:  Osteoarthritis, left hip.<BR> <BR> PROCEDURE:  Left hip replacement.<BR> <BR> SURGEON:  Artist Pais Johnnay Pleitez, MD<BR> <BR> FIRST ASSISTANT:  Arturo Viegas<BR> <BR> ANESTHESIA:  Spinal.<BR> <BR> COMPLICATIONS:   None.<BR> <BR> INDICATION:  This is a 73 year old woman with advanced DJD of her left hip<BR> who presents for elective left total hip replacement.<BR> <BR> PROCEDURE:  Anesthetic was initiated. Foley catheter was placed.<BR> Preoperative antibiotic  was given. The left side was confirmed as the<BR> operative side. She was placed on the Hana table and prepped and draped in<BR> the usual sterile fashion.<BR> <BR> Incision was made from just inferior and lateral to the ASIS distally in<BR> the direction  of the greater trochanter. The tensor fascia lata was<BR> identified. Fascia was opened over the tensor fascia lata. The muscle was<BR> elevated posteriorly and the interval between the tensor and sartorius was<BR> developed down to hip capsule. The lateral  femoral circumflex vessels were<BR> identified and cauterized with the Bovie until adequate hemostasis of this<BR> layer had been obtained. The Cobra retractor was placed over the<BR> anterior-superior hip capsule and a retractor  was placed under the  inferior<BR> hip capsule. The capsule was T'd distally and stay sutures were placed. The<BR> femoral neck was identified and an oscillating saw was used to make a<BR> femoral neck osteotomy. The acetabulum was exposed with a retractor under<BR> the reflected  head of the rectus and anterior capsule retractor into the<BR> notch and a posterior retractor. I had excellent exposure of the acetabulum<BR> that was progressively reamed up to 51 mm and a 52 Pinnacle Sector Shell<BR> was impacted under fluoroscopic  guidance for an anatomic type anteversion<BR> and 40 degrees of abduction. A ________and two 6.5 dome screws were placed.<BR> I turned my attention towards the femur. The leg was extended, adducted and<BR> externally rotated. Deep retractors were placed.  The piriformis was<BR> released from the piriformis fossa. After adequate exposure of the proximal<BR> femur, the medullary canal was entered and broached to a size 10. The trial<BR> was left in place and the neck and hip ball were placed with a  10 in  place.<BR> Leg lengths were equalized with a 1.5 hip ball. I was neutral in the canal<BR> to slight valgus. I was happy with the overall position and size of the<BR> stem. The hip was then dislocated and placed back in position for placement<BR> of the  real stem. Trial stem and ball were removed. I calcar planed and<BR> placed the real size 10 stem without difficulty. The real 1.5 hip ball was<BR> placed.<BR> <BR> The deep wound was copiously irrigated with pulsatile lavage. The hip was<BR> flexed neutral.  Traction was applied. The hip was reduced. There was no<BR> tendency to dislocate on extension and external rotation. The deep wound<BR> was copiously irrigated with the pulsatile lavage. The capsule was closed<BR> with #1 Vicryl sutures for a great capsular  closure. I again irrigated with<BR> pulsatile lavage and closed with fascia lata with #1 Vicryl sutures in<BR> interrupted fashion  over a medium Hemovac after copious irrigated. The skin<BR> and subcutaneous tissue were irrigated and closed with 2-0 Vicryl  sutures,<BR> 4-0 Monocryl sutures and Steri-Strips. A sterile dressing was applied.<BR> There were no complications. Final fluoroscopic spot image was obtained.<BR> The patient was awakened from anesthetic and taken to the recovery room in<BR> satisfactory  condition.<BR> <BR> ESTIMATED BLOOD LOSS:  500 mL.<BR> <BR> DRAINS:  Hemovac x1.<BR> <BR> IMPLANT:  DePuy 52 Pinnacle Sector Shell with a 32 neutral polyethylene<BR> liner and two 6.5 dome screws. On the femoral side DePuy Corail stem 10 mm<BR> with a  +1.5 hip ball.<BR> <BR> COUNTS:  Sponge, instrument, needle count correct.<BR> <BR> SPECIMEN:  None.<BR> <BR> <BR> <BR> Reviewed on 06/15/2009 10:16 AM<BR> <BR> <BR> E-Signed By<BR> Carlyle Lipa, M.D. 06/29/2009 12:56<BR> Carlyle Lipa,  M.D.<BR> <BR> cc:   Carlyle Lipa, M.D.<BR> <BR> <BR> <BR> MD/WMX; D: 06/14/2009  6:59 P; T: 06/15/2009  1:58 A; Doc# [id number]; Job#<BR> [id number]<BJ> <!--EPICE-->

## 2009-06-15 NOTE — Op Note (Addendum)
Name: Kathryn [name], Kathryn [name]  MR #: [id number] Surgeon: Carlyle Lipa,   M.D.  Account #: [id number] Surgery Date: 06/14/2009  DOB: [birth year]  Age: 73 Location: 5S1 574     OPERATIVE REPORT      PREOPERATIVE DIAGNOSIS: Osteoarthritis, left hip.    POSTOPERATIVE DIAGNOSIS: Osteoarthritis, left hip.    PROCEDURE: Left hip replacement.    SURGEON: Carlyle Lipa, MD    FIRST ASSISTANT: Horatio Pel    ANESTHESIA: Spinal.    COMPLICATIONS: None.    INDICATION: This is a 73 year old woman with advanced DJD of her left hip  who presents for elective left total hip replacement.    PROCEDURE: Anesthetic was initiated. Foley catheter was placed.  Preoperative antibiotic was given. The left side was confirmed as the  operative side. She was placed on the Hana table and prepped and draped in  the usual sterile fashion.    Incision was made from just inferior and lateral to the ASIS distally in  the direction of the greater trochanter. The tensor fascia lata was  identified. Fascia was opened over the tensor fascia lata. The muscle was  elevated posteriorly and the interval between the tensor and sartorius was  developed down to hip capsule. The lateral femoral circumflex vessels were  identified and cauterized with the Bovie until adequate hemostasis of this  layer had been obtained. The Cobra retractor was placed over the  anterior-superior hip capsule and a retractor was placed under the inferior  hip capsule. The capsule was T'd distally and stay sutures were placed. The  femoral neck was identified and an oscillating saw was used to make a  femoral neck osteotomy. The acetabulum was exposed with a retractor under  the reflected head of the rectus and anterior capsule retractor into the  notch and a posterior retractor. I had excellent exposure of the acetabulum  that was progressively reamed up to 51 mm and a 52 Pinnacle Sector Shell  was impacted under fluoroscopic guidance for an anatomic type anteversion   and 40 degrees of abduction. A ________and two 6.5 dome screws were placed.  I turned my attention towards the femur. The leg was extended, adducted and  externally rotated. Deep retractors were placed. The piriformis was  released from the piriformis fossa. After adequate exposure of the proximal  femur, the medullary canal was entered and broached to a size 10. The trial  was left in place and the neck and hip ball were placed with a 10 in place.  Leg lengths were equalized with a 1.5 hip ball. I was neutral in the canal  to slight valgus. I was happy with the overall position and size of the  stem. The hip was then dislocated and placed back in position for placement  of the real stem. Trial stem and ball were removed. I calcar planed and  placed the real size 10 stem without difficulty. The real 1.5 hip ball was  placed.    The deep wound was copiously irrigated with pulsatile lavage. The hip was  flexed neutral. Traction was applied. The hip was reduced. There was no  tendency to dislocate on extension and external rotation. The deep wound  was copiously irrigated with the pulsatile lavage. The capsule was closed  with #1 Vicryl sutures for a great capsular closure. I again irrigated with  pulsatile lavage and closed with fascia lata with #1 Vicryl sutures in  interrupted fashion over a medium Hemovac after copious irrigated. The skin  and subcutaneous tissue were irrigated and closed with 2-0 Vicryl sutures,  4-0 Monocryl sutures and Steri-Strips. A sterile dressing was applied.  There were no complications. Final fluoroscopic spot image was obtained.  The patient was awakened from anesthetic and taken to the recovery room in  satisfactory condition.    ESTIMATED BLOOD LOSS: 500 mL.    DRAINS: Hemovac x1.    IMPLANT: DePuy 52 Pinnacle Sector Shell with a 32 neutral polyethylene  liner and two 6.5 dome screws. On the femoral side DePuy Corail stem 10 mm  with a +1.5 hip ball.     COUNTS: Sponge, instrument, needle count correct.    SPECIMEN: None.        Reviewed on 06/15/2009 10:16 AM      E-Signed By  Carlyle Lipa, M.D. 06/29/2009 12:56  Carlyle Lipa, M.D.    cc: Carlyle Lipa, M.D.        MD/WMX; D: 06/14/2009 6:59 P; T: 06/15/2009 1:58 A; Doc# [id number]; Job#  [id number]

## 2009-06-16 LAB — TYPE AND SCREEN
ABO/Rh: O POS
Antibody Screen: NEGATIVE

## 2009-06-16 LAB — TYPE & SCREEN
ABO/Rh(D): O POS
Antibody screen: NEGATIVE

## 2009-06-16 LAB — PROTHROMBIN TIME + INR
INR: 1.4 — ABNORMAL HIGH (ref 0.9–1.1)
Prothrombin time: 13.9 SECS — ABNORMAL HIGH (ref 9.0–11.0)

## 2009-06-16 LAB — HGB & HCT
HCT: 26.9 % — ABNORMAL LOW (ref 35.0–47.0)
HGB: 8.7 g/dL — ABNORMAL LOW (ref 11.5–16.0)

## 2009-06-17 LAB — PROTHROMBIN TIME + INR
INR: 1.5 — ABNORMAL HIGH (ref 0.9–1.1)
Prothrombin time: 15 SECS — ABNORMAL HIGH (ref 9.0–11.0)

## 2009-06-18 LAB — PROTHROMBIN TIME + INR
INR: 1.7 — ABNORMAL HIGH (ref 0.9–1.1)
Prothrombin time: 17.4 SECS — ABNORMAL HIGH (ref 9.0–11.0)

## 2009-06-18 NOTE — Discharge Summary (Addendum)
Name: Kathryn [name], Kathryn [name] Admitted: 06/14/2009  MR #: [id number] Discharged:  Account #: [id number] DOB: [birth year]  Physician: Carlyle Lipa, M.D. Age 73     DISCHARGE SUMMARY      ADMITTING DIAGNOSIS: Left hip osteoarthritis.    DISCHARGE DIAGNOSIS: Left hip osteoarthritis.    HISTORY: This is a 72 year old woman who presented for an elective left  total hip arthroplasty.    HOSPITAL COURSE: The patient underwent an uncomplicated total hip  arthroplasty. She progressed per protocol. No transfusions were necessary.  She was placed on Coumadin for DVT prophylaxis. The patient did well and  was ready for discharge to her home with home care on 06/18/2009.    DISCHARGE INSTRUCTIONS: She was discharged with the following  instructions: Patient will resume her medications as documented on her  medication reconciliation form. Prescriptions for Norco and Coumadin were  called in for the patient. She will take Coumadin for DVT prophylaxis. She  will have home therapy and follow up with [place] in 4 weeks, sooner if there is  a problem.    Please refer to her chart for all details of her hospitalization.            E-Signed By  Carlyle Lipa, M.D. 06/29/2009 12:56    Carlyle Lipa, M.D.    cc: Carlyle Lipa, M.D.        MD/WMX; D: 06/18/2009 12:45 P; T: 06/18/2009 2:05 P; DOC# [id number]; Job#  [id number]

## 2009-08-18 MED ORDER — RALOXIFENE 60 MG TAB
60 mg | ORAL_TABLET | Freq: Every day | ORAL | Status: DC
Start: 2009-08-18 — End: 2010-03-22

## 2009-08-18 MED ORDER — PROPOXYPHENE N-ACETAMINOPHEN 100 MG-650 MG TAB
100-650 mg | ORAL_TABLET | Freq: Four times a day (QID) | ORAL | Status: DC | PRN
Start: 2009-08-18 — End: 2009-09-28

## 2009-08-18 MED ORDER — AZITHROMYCIN 250 MG TAB
250 mg | ORAL_TABLET | ORAL | Status: AC
Start: 2009-08-18 — End: 2009-08-23

## 2009-08-18 MED ORDER — ALBUTEROL SULFATE HFA 90 MCG/ACTUATION AEROSOL INHALER
90 mcg/actuation | RESPIRATORY_TRACT | Status: DC | PRN
Start: 2009-08-18 — End: 2009-09-28

## 2009-08-18 MED ORDER — FUROSEMIDE 20 MG TAB
20 mg | ORAL_TABLET | ORAL | Status: DC | PRN
Start: 2009-08-18 — End: 2010-06-08

## 2009-08-18 MED ORDER — VALACYCLOVIR 500 MG TAB
500 mg | ORAL_TABLET | Freq: Every day | ORAL | Status: DC
Start: 2009-08-18 — End: 2010-06-08

## 2009-08-18 MED ORDER — VALSARTAN 160 MG TAB
160 mg | ORAL_TABLET | Freq: Every day | ORAL | Status: DC
Start: 2009-08-18 — End: 2010-06-08

## 2009-08-18 MED ORDER — MOMETASONE 50 MCG/ACTUATION NASAL SPRAY
50 mcg/actuation | Freq: Every day | NASAL | Status: DC
Start: 2009-08-18 — End: 2009-09-28

## 2009-08-18 MED ORDER — PRAVASTATIN 80 MG TAB
80 mg | ORAL_TABLET | Freq: Every day | ORAL | Status: DC
Start: 2009-08-18 — End: 2010-06-08

## 2009-08-18 MED ORDER — CELECOXIB 200 MG CAP
200 mg | ORAL_CAPSULE | Freq: Two times a day (BID) | ORAL | Status: DC
Start: 2009-08-18 — End: 2010-03-01

## 2009-08-18 NOTE — Progress Notes (Signed)
HISTORY OF PRESENT ILLNESS  Kathryn [name] is a 73 y.o. female presents with Arthritis, Hypertension, Cold Symptoms, Cough and Medication Refill    Agree with nurse note.  Subjective:  Kathryn [name] presents to our office for blood pressure check.  She is tolerating her medications well.  She denies any headache, dizziness,  chest pain or any swelling anywhere.  She occasionally uses Lasix for ankle swelling but rarely needs it.    She would like refills of her medications printed.  She would like to have a prescription for Celebrex.  She received it from another doctor for arthritis and it seemed to help.  She is also hoping our office can prescribe Evista.  She currently sees a gynecologist but she does not want to return to the gynecologist for her yearly Paps or Evista prescriptions.  They also performed a DEXA scan two years ago which was normal.  She wonders if she needs a new one this year.  She denies any decrease in her height, family history of osteoporosis, recent steroid use.  She has a history of smoking cigarettes.    She wanted to have lab work performed today but feels so terrible she would like to return another day for that.  She also wonders if she needs an ultrasound of her thyroid.  She denies any difficulty swallowing.      The patient has had a cough, chest tightness, nasal congestion with clear discharge, itchy, watery eyes, occasionally feels shortness of breath and is wheezing.  She says "I have bronchitis again."  She is requesting a prescription of Z-Pak before it worsens.  She denies any fever, chills or body aches outside of the arthritis.    MedDATA/leh           ROS    Review of Systems negative except as noted above in HPI.    PHYSICAL EXAMINATION    Vital Signs  BP 120/80   Pulse 91   Temp(Src) 97.6 ??F (36.4 ??C) (Oral)   Wt 167 lb 3.2 oz (75.841 kg)   SpO2 96%     General appearance - Well nourished. Well appearing.  Well developed.  No acute distress. Overweight. Raspy voice.  Talks nasally.  Head - Normocephalic.  Atraumatic.  Non tender sinuses x 4.  Eyes - pupils equal and reactive. Extraocular eye movements intact. Sclera anicteric.  Mildly injected sclera.  Ears - Hearing is grossly normal bilaterally.      Nose - normal and patent.  No polyps noted.  No erythema.  Clear discharge.  Increased turbinate edema noted.  Mouth - mucous membranes with adequate moisture.  Posterior pharynx normal with cobblestone appearance.  No erythema, white exudate or obstruction.  Neck - supple.  Midline trachea.  No carotid bruits noted bilaterally.    Chest - Coarse low pitched scattered wheezes.  No rales or rhonchi.  Unlabored respirations.  Deep cough.  Heart - normal rate.  Regular rhythm.  Normal S1, S2.  No murmur noted.  No rubs, clicks or gallops noted.  Abdomen - soft and nondistended.  No masses or organomegaly.  No rebound, rigidity or guarding.  Bowel sounds normal x 4 quadrants.  No tenderness noted.  Back exam - normal range of motion.  No pain on palpation of the spinous processes in the cervical, thoracic, lumbar, sacral regions.  No CVA tenderness.    Neurological - awake, alert and oriented to person, place, and time and event.  Cranial nerves II through  XII intact.  Clear speech.  Muscle strength is +5/5 x 4 extremities.  Sensation is intact to light touch bilaterally.  Steady gait.   Musculoskeletal - Intact x 4 extremities.  Full ROM x 4 extremities.  No pain with movement.    Heme/Lymph - peripheral pulses normal x 4 extremities.  No peripheral edema is noted.  No cervical adenopathy noted.  Skin - no rashes, erythema, ecchymosis, lacerations, abrasions, suspicious moles noted  Psychological -   normal behavior, dress and thought processes.  Good insight. Good eye contact.  Normal affect.  Appropriate mood.  Normal speech.    ASSESSMENT and PLAN     1. Acute bronchitis (466.0)  azithromycin (ZITHROMAX) 250 mg tablet, INHAL RX, AIRWAY OBST/DX SPUTUM INDUCT, albuterol (PROVENTIL HFA, VENTOLIN HFA) 90 mcg/Actuation inhaler   2. Essential hypertension, benign (401.1)  valsartan (DIOVAN) 160 mg tablet   3. Other and unspecified hyperlipidemia (272.4)  pravastatin (PRAVACHOL) 80 mg tablet   4. 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 Low back pain (724.2A)     7. Peripheral edema (782.3Z)  furosemide (LASIX) 20 mg tablet   8. Screening (V82.9A)     9. Cystocele (618.01C)  raloxifene (EVISTA) 60 mg tablet   10. Allergy, unspecified not elsewhere classified (995.3)  mometasone (NASONEX) 50 mcg/Actuation nasal spray       cardiovascular risk and specific lipid/LDL goals reviewed  use of aspirin to prevent MI and TIA's discussed  Pt declines BMD Test at this time since Medicare will not cover it.  Continue current medications and care.  Start ATB.  Albuterol prn breathing.  Prescriptions written and sent to pharmacy.  Medication side effects discussed.  Most recent labs reviewed.  Recheck pertinent labs today.  Addressed weight, diet and exercise with patient.  Counseled patient on: congestion protocol.  Relevant handouts given and discussed with patient.  Immunizations noted.  Advise flu vaccine between the months September to December.  Offered empathy, support, legitamation, prayers, partnership to patient.  Praised pt for progress.    Follow-up Disposition:  Return in about 2 weeks (around 09/01/2009) for bronchitis, labs.

## 2009-08-18 NOTE — Patient Instructions (Signed)
English   Spanish  Bronchitis in Adults: After Your Visit  Your Care Instructions    Bronchitis is inflammation of the bronchial tubes, which carry air to the lungs. The tubes swell and produce mucus, or phlegm. The mucus and inflamed bronchial tubes make you cough. You may have trouble breathing.  Most cases of bronchitis are caused by viruses like those that cause colds. Antibiotics can help cure bronchitis that is caused by bacteria, but not bronchitis that is caused by a virus.  Bronchitis usually develops rapidly and lasts about 2 to 3 weeks in otherwise healthy people.  Follow-up care is a key part of your treatment and safety. Be sure to make and go to all appointments, and call your doctor if you are having problems. It???s also a good idea to know your test results and keep a list of the medicines you take.  How can you care for yourself at home?  ?? Take all medicines exactly as prescribed. If your doctor prescribes antibiotics, take them as directed. Do not stop taking them just because you feel better. You need to take the full course of antibiotics.   ?? Get some extra rest.   ?? Take an over-the-counter pain medicine, such as acetaminophen (Tylenol), ibuprofen (Advil, Motrin), or naproxen (Aleve) to reduce fever and relieve body aches. Read and follow all instructions on the label.   ?? Take an over-the-counter cough medicine that contains dextromethorphan to help quiet a dry, hacking cough so that you can sleep. Avoid cough medicines that have more than one active ingredient. Read and follow all instructions on the label.   ?? Breathe moist air from a humidifier, hot shower, or sink filled with hot water. The heat and moisture will thin mucus so you can cough it out.   ?? Do not smoke. Smoking can make bronchitis worse. If you need help quitting, talk to your doctor about stop-smoking programs and medicines. These can increase your chances of quitting for good.  When should you call for help?   Call 911 anytime you think you may need emergency care. For example, call if:  ?? You have severe trouble breathing.  Call your doctor now or seek immediate medical care if:  ?? You have new or worsening shortness of breath.   ?? You cough up blood.   ?? You have new or increasing wheezing???a whistling sound when you breathe.   ?? You have a cough that brings up yellow or green mucus (sputum) from the lungs and occurs along with a fever.  Watch closely for changes in your health, and be sure to contact your doctor if:  ?? You are not getting better after 3 to 5 days.     Where can you learn more?   Go to [email or web address].  Enter H333 in the search box to learn more about "Bronchitis in Adults: After Your Visit."   ?? 2006-2010 [employer]. Care instructions adapted under license by [employer] (which disclaims liability or warranty for this information). This care instruction is for use with your licensed healthcare professional. If you have questions about a medical condition or this instruction, always ask your healthcare professional. Healthwise disclaims any warranty or liability for your use of this information.      Advised protocol for clearing congestion:  Increase fluid intake, especially water to thin mucous and boost the immune system.  Avoid sugar and dairy while congested since they thicken mucous.  Get plenty of rest!  Gargle 3  times daily and as needed in Listerine or warm salt water vinegar solutions (1 tsp salt, 1 tsp vinegar in 1 cup lukewarm water.)  Use OTC nasal saline spray up each nostril twice daily.  Use humidifier at bedtime.  Use OTC Mucinex 600 mg twice daily to loosen mucous.    Use OTC Tylenol Arthritis or Ibuprofen up to 800 mg up to 3 times daily as needed for pain, fever or headaches.  Avoid decongestants and Ibuprofen if you have high blood pressure!  If mucous is consistently discolored yellow or green throughout the day for more than a week, call the doctor for an evaluation.    English   Spanish  Bronchitis in Adults: After Your Visit  Your Care Instructions    Bronchitis is inflammation of the bronchial tubes, which carry air to the lungs. The tubes swell and produce mucus, or phlegm. The mucus and inflamed bronchial tubes make you cough. You may have trouble breathing.  Most cases of bronchitis are caused by viruses like those that cause colds. Antibiotics can help cure bronchitis that is caused by bacteria, but not bronchitis that is caused by a virus.  Bronchitis usually develops rapidly and lasts about 2 to 3 weeks in otherwise healthy people.  Follow-up care is a key part of your treatment and safety. Be sure to make and go to all appointments, and call your doctor if you are having problems. It???s also a good idea to know your test results and keep a list of the medicines you take.  How can you care for yourself at home?  ?? Take all medicines exactly as prescribed. If your doctor prescribes antibiotics, take them as directed. Do not stop taking them just because you feel better. You need to take the full course of antibiotics.   ?? Get some extra rest.    ?? Take an over-the-counter pain medicine, such as acetaminophen (Tylenol), ibuprofen (Advil, Motrin), or naproxen (Aleve) to reduce fever and relieve body aches. Read and follow all instructions on the label.   ?? Take an over-the-counter cough medicine that contains dextromethorphan to help quiet a dry, hacking cough so that you can sleep. Avoid cough medicines that have more than one active ingredient. Read and follow all instructions on the label.   ?? Breathe moist air from a humidifier, hot shower, or sink filled with hot water. The heat and moisture will thin mucus so you can cough it out.   ?? Do not smoke. Smoking can make bronchitis worse. If you need help quitting, talk to your doctor about stop-smoking programs and medicines. These can increase your chances of quitting for good.  When should you call for help?  Call 911 anytime you think you may need emergency care. For example, call if:  ?? You have severe trouble breathing.  Call your doctor now or seek immediate medical care if:  ?? You have new or worsening shortness of breath.   ?? You cough up blood.   ?? You have new or increasing wheezing???a whistling sound when you breathe.   ?? You have a cough that brings up yellow or green mucus (sputum) from the lungs and occurs along with a fever.  Watch closely for changes in your health, and be sure to contact your doctor if:  ?? You are not getting better after 3 to 5 days.     Where can you learn more?   Go to [employer].  Enter H333 in the search box to  learn more about "Bronchitis in Adults: After Your Visit."    ?? 2006-2010 [employer]. Care instructions adapted under license by [employer] (which disclaims liability or warranty for this information). This care instruction is for use with your licensed healthcare professional. If you have questions about a medical condition or this instruction, always ask your healthcare professional. Healthwise disclaims any warranty or liability for your use of this information.

## 2009-08-18 NOTE — Progress Notes (Signed)
Here for cold symptoms and "bronchitis". Wants a Zpak. Would like to have a Rx for Celebrex. Wants to get Evista from this office. Will not go back to GYN, as states does not need a pap. Last Dexa scan was 2 years ago. Wonders if needs thyroid ultrasound. Thyroid labs were all OK, but thinks has puffiness in throat area.

## 2009-09-01 LAB — AMB POC URINALYSIS DIP STICK MANUAL W/O MICRO
Bilirubin (UA POC): NEGATIVE
Glucose (UA POC): NEGATIVE
Ketones (UA POC): NEGATIVE
Nitrites (UA POC): NEGATIVE
Protein (UA POC): 0 mg/dL
Specific gravity (UA POC): 1.01 (ref 1.001–1.035)
pH (UA POC): 5 (ref 4.6–8.0)

## 2009-09-01 NOTE — Progress Notes (Signed)
Here to follow up from bronchitis. A few weeks ago started urinary burning again. Had been given an antibiotic from GYN in August for uti.Knows has a cystocele.

## 2009-09-01 NOTE — Progress Notes (Signed)
HISTORY OF PRESENT ILLNESS  Kathryn [name] is a 72 y.o. female presents with Cough, Urinary Burning, Results and Stress    Agree with nurse note.  Subjective:  Kathryn [name] presents to our office for followup of bronchitis.  She was here two weeks ago and treated with a nebulizer treatment while in the office and sent home with a Z-pak.  She finished the Z-pak and feels much better.    In August, she was given a prescription for an antibiotic by a gynecologist for bladder infection.  She would like her urine checked today due to burning with urination a few weeks ago.  She knows she has a cystocele and would like a referral to talk about having it repaired.  She denies any fever, chills, stomach pain, back pain, blood in her urine.    She never had a thyroid ultrasound performed and would like a prescription for it.    She has been more tired lately, although her energy improved a little after she was treated for the bronchitis.  Her husband has been real sick over the past year, and she has been very stressed with caring for him and regarding his health that declines off and on.    MedDATA/jls        ROS    Review of Systems negative except as noted above in HPI.    PHYSICAL EXAMINATION    Vital Signs  BP 116/74   Pulse 56   Temp(Src) 97.7 ??F (36.5 ??C) (Oral)   Wt 169 lb (76.658 kg)   SpO2 98%    General appearance - Well nourished. Well appearing.  Well developed.  No acute distress. Overweight.   Head - Normocephalic.  Atraumatic.  Non tender sinuses x 4.  Eyes - pupils equal and reactive. Extraocular eye movements intact. Sclera anicteric.  Mildly injected sclera.  Ears - Hearing is grossly normal bilaterally.      Nose - normal and patent.  No polyps noted.  No erythema.  No discharge.    Mouth - mucous membranes with adequate moisture.  Posterior pharynx normal with cobblestone appearance.  No erythema, white exudate or obstruction.   Neck - supple.  Midline trachea.  No carotid bruits noted bilaterally.  No thyromegaly noted.  Chest - clear to auscultation bilaterally anterriorly and posteriorly.  No wheezes.  No rales or rhonchi.  Breath sounds are symmetrical bilaterally.  Unlabored respirations.  Heart - low rate.  Regular rhythm.  Normal S1, S2.  No murmur noted.  No rubs, clicks or gallops noted.  Abdomen - soft and nondistended.  No masses or organomegaly.  No rebound, rigidity or guarding.  Bowel sounds normal x 4 quadrants.  Mild suprapubic tenderness noted.  Back exam - normal range of motion.  No pain on palpation of the spinous processes in the cervical, thoracic, lumbar, sacral regions.  No CVA tenderness.    Neurological - awake, alert and oriented to person, place, and time and event.  Cranial nerves II through XII intact.  Clear speech.  Muscle strength is +5/5 x 4 extremities.  Sensation is intact to light touch bilaterally.  Steady gait.   Musculoskeletal - Intact x 4 extremities.  Full ROM x 4 extremities.  No pain with movement.    Heme/Lymph - peripheral pulses normal x 4 extremities.  No peripheral edema is noted.  No cervical adenopathy noted.  Skin - no rashes, erythema, ecchymosis, lacerations, abrasions, suspicious moles noted  Psychological -   normal  behavior, dress and thought processes.  Good insight. Good eye contact.  Normal affect.  Appropriately concerned mood.  Normal speech.    ASSESSMENT and PLAN    1. Dysuria (788.1)  AMB POC URINALYSIS DIP STICK MANUAL W/O MICRO, URINALYSIS W/ RFLX MICROSCOPIC, CULTURE, URINE   2. Acute bronchitis (466.0)  azithromycin (ZITHROMAX) 250 mg tablet    Resolved   3. Hypertension (401.9AJ)     4. Cystocele (618.01C)  REFERRAL TO UROLOGY   5. Thyromegaly (240.9C)  US THYROID / PARATHYROID   6. Other malaise and fatigue (780.79)         use of aspirin to prevent MI and TIA's discussed    Continue current medications and care.     Prescriptions written and sent to pharmacy.  Medication side effects discussed.  Most recent labs reviewed.  Recheck pertinent labs today.  Referrals given.  Keep appointments with specialists.  Addressed weight, diet and exercise with patient.  Counseled patient on: pt health concerns.  Stressors.  UTI.  Relevant handouts given and discussed with patient.  Immunizations noted and up to date.  Offered empathy, support, legitamation, prayers, partnership to patient.  Praised pt for progress.    Follow-up Disposition:  Return in about 6 months (around 03/01/2010) for fatigue, thyroid.

## 2009-09-02 LAB — URINALYSIS W/ RFLX MICROSCOPIC
Bilirubin: NEGATIVE
Blood: NEGATIVE
Glucose: NEGATIVE
Ketone: NEGATIVE
Leukocyte Esterase: NEGATIVE
Nitrites: NEGATIVE
Protein: NEGATIVE
Specific Gravity: 1.013 (ref 1.005–1.030)
Urobilinogen: 0.2 mg/dL (ref 0.0–1.9)
pH (UA): 6 (ref 5.0–7.5)

## 2009-09-05 LAB — CULTURE, URINE

## 2009-09-06 ENCOUNTER — Encounter

## 2009-09-06 MED ORDER — TRIMETHOPRIM-SULFAMETHOXAZOLE 160 MG-800 MG TAB
160-800 mg | ORAL_TABLET | Freq: Two times a day (BID) | ORAL | Status: AC
Start: 2009-09-06 — End: 2009-09-13

## 2009-09-06 NOTE — Telephone Encounter (Signed)
pls review and advise.

## 2009-09-06 NOTE — Telephone Encounter (Signed)
Left msg on home machine

## 2009-09-06 NOTE — Telephone Encounter (Signed)
Would like results from u/a soon since she will be going out of town. Believes she needs abx called in.  Please call [phone]  [facility] [phone]

## 2009-09-06 NOTE — Progress Notes (Addendum)
Quick Note:    Pt has bladder infection. Rx for Bactrim was sent to the pharmacy this morning.  ______

## 2009-09-06 NOTE — Telephone Encounter (Signed)
Yes.  She has a bladder infection.  A Rx for Bactrim has been sent to the pharmacy.  She can stop the ATB if feeling better in 3 days.  Other wise , take it for 7 days.

## 2009-09-27 NOTE — Progress Notes (Signed)
Quick Note:    Refer to endocrinologist for thyroid nodule.  ______

## 2009-09-29 NOTE — Telephone Encounter (Signed)
Pt states finished Bactrim, but still has urinary pressure. Had some itching with Bactrim. (chart updated). Wonders if a different antibiotic could be called in. Pls advise. (Has colonoscopy tomorrow am).

## 2009-09-29 NOTE — Progress Notes (Signed)
Quick Note:    Advised of ultrasound results  ______

## 2009-09-30 ENCOUNTER — Encounter

## 2009-09-30 MED ORDER — FLUCONAZOLE 150 MG TAB
150 mg | ORAL_TABLET | Freq: Every day | ORAL | Status: AC
Start: 2009-09-30 — End: 2009-10-01

## 2009-09-30 MED ORDER — CIPROFLOXACIN 250 MG TAB
250 mg | ORAL_TABLET | Freq: Two times a day (BID) | ORAL | Status: AC
Start: 2009-09-30 — End: 2009-10-07

## 2009-09-30 MED ADMIN — meperidine (DEMEROL) injection 12.5-50 mg: INTRAVENOUS | @ 13:00:00 | NDC 00409117830

## 2009-09-30 MED ADMIN — midazolam (VERSED) injection 0.5-10 mg: INTRAVENOUS | @ 13:00:00 | NDC 10019002837

## 2009-09-30 MED ADMIN — dextrose 5% and 0.9% NaCl infusion: INTRAVENOUS | @ 13:00:00 | NDC 00409794109

## 2009-09-30 MED FILL — HURRICAINE 20 % MUCOSAL SPRAY: 20 % | Qty: 59.2

## 2009-09-30 MED FILL — DEXTROSE 5% IN NORMAL SALINE IV: INTRAVENOUS | Qty: 1000

## 2009-09-30 MED FILL — MIDAZOLAM 1 MG/ML IJ SOLN: 1 mg/mL | INTRAMUSCULAR | Qty: 10

## 2009-09-30 MED FILL — MEPERIDINE (PF) 50 MG/ML IJ SOLN: 50 mg/mL | INTRAMUSCULAR | Qty: 1

## 2009-09-30 NOTE — Procedures (Signed)
Colonoscopy    Indications: Personal History of polyps    Pre-operative Diagnosis: see above    Post-operative Diagnosis:  colon polyps, diverticulosis    Meperidine 37.5 mg; midazolam 3 mg    Procedure Details   Prior to the procedure its objectives, risks, consequences and alternatives were discussed with the patient who then elected to proceed.  All questions were answered.      Digital Rectal Exam:  was normal     The Olympus videocolonoscope was inserted in the rectum and advanced to the cecum.  The cecum was identified by typical landmarks.  The colonoscope was slowly and carefully withdrawn as the mucosa was inspected.  In the proximal ascending colon a lobulated 8 mm polyp was snared and recovered with good hemostasis.  At the hepatic flexure a 6 mm sessile polyp was treated completely with hot forceps technique.  Sigmoid diverticula were present.  No other abnormalities were noted.  Retroflexion in the rectum was normal.      The preparation was adequate.      Findings:    2 polyps  diverticula            Complications:  none             Gwenette Greet, MD  8:20 AM  09/30/2009

## 2009-09-30 NOTE — Procedures (Signed)
Colonoscopy    Indications: Personal History of polyps    Pre-operative Diagnosis: see above    Post-operative Diagnosis:  colon polyps, diverticulosis    Meperidine 37.5 mg; midazolam 3 mg    Procedure Details   Prior to the procedure its objectives, risks, consequences and alternatives were discussed with the patient who then elected to proceed.  All questions were answered.      Digital Rectal Exam:  was normal     The Olympus videocolonoscope was inserted in the rectum and advanced to the cecum.  The cecum was identified by typical landmarks.  The colonoscope was slowly and carefully withdrawn as the mucosa was inspected.  In the proximal ascending colon a lobulated 8 mm polyp was snared and recovered with good hemostasis.  At the hepatic flexure a 6 mm sessile polyp was treated completely with hot forceps technique.  Sigmoid diverticula were present.  No other abnormalities were noted.  Retroflexion in the rectum was normal.      The preparation was adequate.      Findings:    2 polyps  diverticula            Complications:  none             Dejohn Ibarra F. Nevah Dalal, MD  8:20 AM  09/30/2009

## 2009-09-30 NOTE — H&P (Signed)
Pre-endoscopy H and P    The patient was seen and examined in the endoscopy suite.  The airway was assessed and docuemeted.  The problem list and medications were reviewed.     Review of systems is:  negative for shortness of breath or chest pain      The heart, lungs and mental status were satisfactory for the administration of conscious sedation and for the procedure.      I discussed with the patient the objectives, risks, consequences and alternatives to the procedure.      Gwenette Greet, MD  09/30/2009  7:51 AM

## 2009-09-30 NOTE — Progress Notes (Signed)
09/30/09 0908 Dr. Boyce Medici in to see pt. A. Weis,RN

## 2009-09-30 NOTE — Telephone Encounter (Signed)
Rxs for Cipro x 7 days and Diflucan for yeast have been sent to the pharmacy.

## 2009-09-30 NOTE — Telephone Encounter (Signed)
Pt was told yest to expect a Rx at pharm to p/up today.

## 2009-10-16 DIAGNOSIS — E042 Nontoxic multinodular goiter: Secondary | ICD-10-CM | POA: Insufficient documentation

## 2009-10-20 NOTE — Patient Instructions (Signed)
Multinodular goiter (large thyroid with many nodules or lumps)    Concerns about a large multinodular goiter:  1. Is it making too much thyroid hormone?  We will check lab tests  2. Is the size causing problems? - Can cause trouble breathing, swallowing, or hoarseness.  You may have some of these symptoms (swallowing trouble). Let me know if this gets worse  3. Is it cancer?  We will follow with another ultrasound in 9-12 months to monitor for changes.  We will also schedule a biopsy of 2 thyroid nodules.  This is called an FNA (fine needle aspiration).  This will allow [place] to see the cells comprising the nodule and determine whether it is likely cancerous or not.  Most likely (90-95%) it is benign/non cancerous.

## 2009-10-20 NOTE — Progress Notes (Addendum)
No chief complaint on file.    History of Present Illness: Kathryn [name] is a 74 y.o. female who presents for evaluation of multinodular goiter.  Kathryn [name] said she thought she noted some swelling in her lower neck and mentioned this to Dr. Harle Stanford.  Thyroid ultrasound was then done to evaluate thyromegally.  TSH was normal in 01/2008 and 02/2009.  Thyroid ultrasound is noted below and showed 3 large thyroid nodules on the right and and one small (<1cm) and one large nodule on the left lobe.    Kathryn [name] has noted some 'swelling'.  She has not pain in the area of her thyroid, but does feel like she has had some occasional difficulty swallowing in the last month.  She also has occasional hoarseness.    Regarding sx of hyperthyroidism, she reports unintentional weight loss (10-15 lb), occasional heat intolerance, some nervousness/anxiety and tremulousness, occasional palpitations, and some increased bowel frequency. She has dry skin.  She is also bothered by hair loss over the last year.   Her husband has liver cancer and acknowledges that some of her sx may be related to stress.    Past Medical History   Diagnosis Date   ??? Edema      left ankle   ??? DDD (degenerative disc disease) 10/1999     L 4-5 by MRI.  Dr. Shiela Mayer.  Dr. Robie Ridge, Pain   ??? Urinary incontinence, stress      mild.  Dr. Winnifred Friar   ??? Polyp of rectum      Dr. Reed Pandy   ??? Cystocele      Dr. Winnifred Friar   ??? Postmenopausal HRT (hormone replacement therapy)      Hx  for 6 years.  Dr. Lavone Nian   ??? Morton's neuroma 2000     Left.  Dr. Gillis Santa   ??? Hematuria of undiagnosed cause 1980, 2009   ??? Single renal cyst 02/12/2008     Left.  0.9 cm   ??? Subchondral cysts 2009     Left hip femoral heads.  Dr. Adah Perl   ??? DJD (degenerative joint disease) 05/26/04     Dr. Shiela Mayer.   ??? Hypercholesterolemia    ??? Hypertension    ??? Kidney stone 12/2002     Dr. Manson Passey   ??? Chickenpox      childhood    ??? DJD (degenerative joint disease) of hip 2009     Dr. Kelby Aline.     ??? Rotator cuff tendinitis 12/20/04     Right.  Dr. Jen Mow   ??? Benign positional vertigo 01/07/04     Dr. Minette Headland   ??? SOB (shortness of breath) 07/11/04     Dr. Abigail Miyamoto.  Neg Stress Cardiolite.  EF 60 %.   ??? Unspecified vitamin D deficiency 2008   ??? Herpes zoster 02/26/07, 06/21/07, 08/27/07     Left buttocks then Right T2-3 dermatome   ??? Herpes simplex type 2 infection 10/05/08     Left buttocks   ??? Bronchitis 08/2009   ??? Other ill-defined conditions      bil. cataracts   ??? Nausea & vomiting        Past Surgical History   Procedure Date   ??? Hx cyst removal 1970s     Rt breast then Left.  benign.   ??? Hx bunionectomy 1970s, 2003     Bilateral.  Dr. Stephannie Li.   ??? Hx hysterectomy 1988  with BSO   ??? Foot/toes surgery proc unlisted 2000     Left Morton's Neuroma.  Dr. Stephannie Li   ??? Hx hip replacement 12/21/08 Right, 06/14/09 Left     Dr. Birdena Crandall      ??? Hx breast biopsy 1970's     bil.   ??? Hx cataract removal      bilateral   ??? Hx tonsillectomy    ??? Hx appendectomy    ??? Hx orthopaedic      repair morton's neuroma   ??? Total hip arthroplasty 2010     bil. hips       Current outpatient prescriptions   Medication Sig   ??? ascorbic acid (VITAMIN C) 500 mg tablet Take 500 mg by mouth nightly.   ??? multivitamin (ONE A DAY) tablet Take 1 Tab by mouth daily.   ??? valsartan (DIOVAN) 160 mg tablet Take 1 Tab by mouth daily. For blood pressure   ??? valacyclovir (VALTREX) 500 mg tablet Take 1 Tab by mouth daily. For HSV   ??? raloxifene (EVISTA) 60 mg tablet Take 1 Tab by mouth daily. For bones   ??? pravastatin (PRAVACHOL) 80 mg tablet Take 1 Tab by mouth daily for 360 days. For cholesterol   ??? furosemide (LASIX) 20 mg tablet Take 1 Tab by mouth as needed. For edema   ??? celecoxib (CELEBREX) 200 mg capsule Take 1 Cap by mouth two (2) times a day.   ??? CLOBEX 0.05 % Sham by Apply Externally route.       Allergies   Allergen Reactions    ??? Penicillins Rash and Swelling   ??? Other Rash     All cillins   ??? Tetracycline Itching   ??? Zocor (Simvastatin) Other (comments)     Leg cramps   ??? Lipitor (Atorvastatin) Other (comments)     Leg cramps   ??? Niaspan (Niacin) Other (comments)     Caused elevated BP   ??? Crestor (Rosuvastatin) Other (comments)     Elevated CPK   ??? Bactrim (Sulfamethoprim Ds) Itching       Family History   Problem Relation Age of Onset   ??? Diabetes Mother    ??? Heart Attack Mother    ??? Cancer Father      pancreatic   ??? Cancer Brother      bile duct cancer   ??? Heart Attack Sister      age 11 y/o   ??? Arthritis-osteo Sister    ??? Asthma Sister        History   Social History   ??? Marital Status: Married     Spouse Name: N/A     Number of Children: N/A   ??? Years of Education: N/A   Occupational History   ??? Not on file.   Social History Main Topics   ??? Smoking status: Former Smoker -- 0.5 packs/day for 10 years     Types: Cigarettes     Quit date: 10/17/1987   ??? Smokeless tobacco: Never Used   ??? Alcohol Use: No   ??? Drug Use: No   ??? Sexually Active: Yes -- Female partner(s)     Birth Control/ Protection: None   Other Topics Concern   ??? Not on file   Social History Narrative   ??? No narrative on file       Review of Systems:  - Constitutional Symptoms: no fevers, chills, See HPI   - Eyes: no blurry vision or double vision  -  Cardiovascular: no chest pain, See HPI   - Respiratory: no cough or shortness of breath  - Gastrointestinal: See HPI   - Musculoskeletal: no joint pains or weakness  - Integumentary: no rashes, See HPI   - Neurological: no numbness, tingling, or headaches  - Psychiatric: no depression or anxiety  - Endocrine: See HPI , no polyuria or polydipsia    Physical Examination:  - Blood pressure 138/66, pulse 94, height 5\' 2"  (1.575 m), weight 174 lb 12.8 oz (79.289 kg).  - General: pleasant, well-groomed, no distress, good eye contact  - HEENT: EOMI, MMM, good dentition   - Neck: difficult to palpate thyroid due to adipose tissue and surrounding neck muscles, did not appreciate any thyromegaly or nodues, no other neck masses or LAD, or carotid bruits  - Cardiovascular: regular, normal rate, normal S1 and S2, no murmurs/rubs/gallops   - Respiratory: clear to auscultation bilaterally  - Musculoskeletal: no proximal muscle weakness in upper or lower extremities  - Integumentary: no acanthosis nigricans,no rashes, no edema  - Neurological: reflexes 2+ at biceps, no tremor  - Psychiatric: normal mood and affect    Data Reviewed:   Component      Latest Ref Rng 02/26/2009 01/29/2008          11:22 AM  9:39 AM   TSH, 3rd generation      0.36 - 3.74 UIU/ML 1.00 1.48     Thyroid ultrasound 09/27/2009  REPORT:  Ultrasonography of the thyroid gland was performed. The right lobe measures   5.4 x 3.0 x 2.4 cm. The left lobe measures 5.0 x 2.0 x 1.8 cm. The isthmus   measures 4.4 mm.    The thyroid echotexture is diffusely heterogeneous. Within the right upper   pole, there is a 2.0 x 1.3 x 1.1 cm hypoechoic nodule. Within the right   midpole, there is a 2.5 x 1.5 x 2.2 cm isoechoic nodule. More medially in   the right midpole, there is a 2.7 x 1.5 x 1.7 cm isoechoic nodule. In the   mid left lobe, there is a 2.6 x 1.7 x 1.0 cm nodule. There is a 7.4 x 4.9 x   7.0 mm hypoechoic nodule in the upper pole on the left.    IMPRESSION: Multinodular goiter. This could further be assessed by nuclear   scintigraphy.     Assessment/Plan:   1. Multinodular goiter (241.1R)   - Clinically, she has some sx of hyperthyroidism.  Will repeat TSH and Free T4 today.  RAI scan would be done if she is hyperthyroid.  - Reviewed ultrasound images myself and discussed images with radiology as well  - Hypoechoic and superior nodule on the right has micro-calcifications and should be biopsied.  Additionally the 2.5 cm nodule on the R has some increased blood flow and biopsy of this nodule will be requested as well.   - Repeat ultrasound will be planned for about 9 months, if biopsies are benign, to follow all nodules for growth or other changes.   - Discussed thyroid nodules and evaluation and management with her at length and answered her questions.  Explained thyroid nodule FNA procedure.  We spent 45 minutes together face to face and 25 minutes were spent on counseling and explaining rationale for above treatment plan.  - Gave her handout from [employer] on thyroid nodules           Patient Instructions   Multinodular goiter (large thyroid with many nodules or  lumps)    Concerns about a large multinodular goiter:  1. Is it making too much thyroid hormone?  We will check lab tests  2. Is the size causing problems? - Can cause trouble breathing, swallowing, or hoarseness.  You may have some of these symptoms (swallowing trouble). Let me know if this gets worse  3. Is it cancer?  We will follow with another ultrasound in 9-12 months to monitor for changes.  We will also schedule a biopsy of 2 thyroid nodules.  This is called an FNA (fine needle aspiration).  This will allow [place] to see the cells comprising the nodule and determine whether it is likely cancerous or not.  Most likely (90-95%) it is benign/non cancerous.      Follow-up Disposition:  Return in about 10 months (around 08/20/2010).    Copy sent to:   Dr. Leafy Ro    Addendum 10/27/2009  TSH normal. Await thyroid biopsy    Addendum 11/04/2009  Thyroid FNA pathology results:  Follicular cells, Hurthle cells and abundant colloid compatible with  benign colloid nodule   No cells diagnostic for malignancy    - Called and reviewed results with Kathryn [name].  Repeat ultrasound in 9-12 months.

## 2009-10-21 LAB — T4, FREE: T4, Free: 1.06 ng/dL (ref 0.82–1.77)

## 2009-10-21 LAB — TSH 3RD GENERATION: TSH: 0.943 u[IU]/mL (ref 0.450–4.500)

## 2009-10-22 ENCOUNTER — Encounter

## 2009-10-23 LAB — MICROSCOPIC EXAMINATION: Bacteria: NONE SEEN

## 2009-10-23 LAB — URINALYSIS W/ RFLX MICROSCOPIC
Bilirubin: NEGATIVE
Glucose: NEGATIVE
Ketone: NEGATIVE
Leukocyte Esterase: NEGATIVE
Nitrites: NEGATIVE
Protein: NEGATIVE
Specific Gravity: 1.016 (ref 1.005–1.030)
Urobilinogen: 0.2 mg/dL (ref 0.0–1.9)
pH (UA): 6 (ref 5.0–7.5)

## 2009-10-25 NOTE — Progress Notes (Addendum)
Quick Note:    No UTI.  ______

## 2009-10-27 NOTE — Progress Notes (Addendum)
Quick Note:    Advised of urine result  ______

## 2009-11-03 MED ADMIN — sodium bicarbonate (NEUT) injection 40 mg: SUBCUTANEOUS | @ 18:00:00 | NDC 00409660902

## 2009-11-03 MED FILL — NEUT 4 % INTRAVENOUS SOLUTION: 4 % | INTRAVENOUS | Qty: 1

## 2010-03-01 MED ORDER — CELECOXIB 200 MG CAP
200 mg | ORAL_CAPSULE | Freq: Two times a day (BID) | ORAL | Status: DC
Start: 2010-03-01 — End: 2010-06-08

## 2010-03-01 MED ORDER — ALPRAZOLAM 0.5 MG TAB
0.5 mg | ORAL_TABLET | Freq: Three times a day (TID) | ORAL | Status: DC | PRN
Start: 2010-03-01 — End: 2010-06-08

## 2010-03-01 NOTE — Patient Instructions (Signed)
English   Spanish  Adjustment Disorder: After Your Visit  Your Care Instructions  Adjustment disorder occurs when you develop emotional or behavioral problems in response to stress. In an adjustment disorder, the response to the stress is far more severe than a normal response should be. It is severe enough to affect your work or social life and may cause depression and physical pains and problems. Events that may cause this response can include a divorce, money problems, starting school or a new job, or anything that normally causes some stress.  An adjustment disorder is usually a short-term problem. It occurs within 3 months of the stressful event or change. If the response lasts longer than 6 months after the event ends, you may have a more serious disorder.  Follow-up care is a key part of your treatment and safety. Be sure to make and go to all appointments, and call your doctor if you are having problems. It???s also a good idea to know your test results and keep a list of the medicines you take.  How can you care for yourself at home?  ?? Go to all scheduled counseling sessions. Do not skip any because you are feeling better.   ?? If your doctor prescribed medicines, take them exactly as prescribed. Call your doctor if you think you are having a problem with your medicine. You will get more details on the specific medicines your doctor prescribes.   ?? Discuss the causes of your stress with a good friend or family member, or join a support group for people with similar problems. Talking to others sometimes relieves stress.   ?? Get at least 30 minutes of exercise on most days of the week. Walking is a good choice. You also may want to do other activities, such as running, swimming, cycling, or playing tennis or team sports.  Relaxation techniques  Do relaxation exercises 10 to 20 minutes a day. You can play soothing, relaxing music while you do them, if you wish.   ?? Tell others in your house that you are going to do your relaxation exercises. Ask them not to disturb you.   ?? Find a comfortable place, away from all distractions and noise.   ?? Lie down on your back, or sit with your back straight.   ?? Focus on your breathing. Make it slow and steady.   ?? Breathe in through your nose. Breathe out through either your nose or mouth.   ?? Breathe deeply, filling up the area between your navel and your rib cage. Breathe so that your belly goes up and down.   ?? Do not hold your breath.   ?? Breathe like this for 5 to 10 minutes. Notice the feeling of calmness throughout your whole body.  As you continue to breathe slowly and deeply, relax by doing the following for another 5 to 10 minutes:  ?? Tighten and relax each muscle group in your body. You can begin at your toes and work your way up to your head.   ?? Imagine your muscle groups relaxing and becoming heavy.   ?? Empty your mind of all thoughts.   ?? Let yourself relax more and more deeply.   ?? Become aware of the state of calmness that surrounds you.   ?? When your relaxation time is over, you can bring yourself back to alertness by moving your fingers and toes and then your hands and feet and then stretching and moving your entire body. Sometimes  people fall asleep during relaxation, but they usually wake up shortly afterward.   ?? Always give yourself time to return to full alertness before you drive a car or do anything that might cause an accident if you are not fully alert. Never play a relaxation tape while you drive a car.  When should you call for help?  Call 911 anytime you think you may need emergency care. For example, call if:  ?? You feel you cannot stop from hurting yourself or someone else.  Watch closely for changes in your health, and be sure to contact your doctor if:  ?? You cannot go to your counseling sessions.   ?? You do not get better as expected.    Where can you learn more?    Go to [email or web address]  Enter R087 in the search box to learn more about "Adjustment Disorder: After Your Visit."   ?? 1995-2010 [employer]. Care instructions adapted under license by [employer] (which disclaims liability or warranty for this information). This care instruction is for use with your licensed healthcare professional. If you have questions about a medical condition or this instruction, always ask your healthcare professional. [employer]wise disclaims any warranty or liability for your use of this information.  Content Version: 8.8.72453; Last Revised: July 1, 2009English   Spanish  Grieving (Actual/Anticipated): After Your Visit  Your Care Instructions  Grief is your emotional reaction to a major loss. The words "sorrow" and "heartache" often are used to describe feelings of grief. You feel grief when you lose a beloved person, pet, place, or thing. It is also natural to feel grief when you lose a valued way of life, such as a job, marriage, or good health.  You may begin to grieve before a loss occurs. You may grieve for a loved one who is sick and dying. Children and adults often feel the pain of loss before a big move or divorce. This type of grief helps you get ready for a loss.  Grief is different for each person. There is no "normal" or "expected" period of time for grieving. Some people adjust to their loss within a couple of months. Others may take 2 years or longer, especially if their lives were changed a lot or if the loss was sudden and shocking.  Grieving can cause problems such as headaches, loss of appetite, and trouble with thinking or sleeping. You may withdraw from friends and family and behave in ways that are unusual for you. Grief may cause you to question your beliefs and views about life.   Grief is natural and does not require medical treatment. But if you have trouble sleeping, it may help to take sleeping pills for a short time. It may help to talk with people who have been through or are going through similar losses. You may also want to talk to a counselor about your feelings. Talking about your loss, sharing your cares and concerns, and getting support from others are important parts of healthy grieving.  Follow-up care is a key part of your treatment and safety. Be sure to make and go to all appointments, and call your doctor if you are having problems. It???s also a good idea to know your test results and keep a list of the medicines you take.  How can you care for yourself at home?  ?? Get enough sleep. Your mind helps make sense of your life while you sleep. Missing sleep can lead to illness and make it  harder for you to deal with your grief.   ?? Eat healthy foods. Try to avoid eating only foods that give you comfort. Ask someone to join you for a meal if you do not like eating alone. Take a daily vitamin.   ?? Get some exercise every day. Even a walk can help you deal with your grief. Other exercises, such as yoga, can also help you manage stress.   ?? Comfort yourself. Take time to look at photos or use special items that make you feel better.   ?? Stay involved in your life. Do not withdraw from the activities you enjoy. People you know at work, church, clubs, or other groups can help you get through your period of grief.   ?? Think about joining a support group to help you deal with your grief. There are many support groups to help people recover from grief.  When should you call for help?  Be sure to contact your doctor if:  ?? You feel that life is meaningless, or you think about killing yourself.   ?? A grieving person you know talks about hurting himself or herself.   ?? You have any of the following problems that last for 2 or more weeks:   ?? You feel sad a lot or cry all the time.    ?? You have trouble sleeping, or you sleep too much.   ?? You find it hard to concentrate, make decisions, or remember things.   ?? You change how you normally eat.   ?? You feel guilty about the death or loss you have suffered.   ?? You are using alcohol or drugs to help you cope with your loss.    Where can you learn more?   Go to [email or web address]  Enter H249 in the search box to learn more about "Grieving (Actual/Anticipated): After Your Visit."   ?? 1995-2010 [employer]. Care instructions adapted under license by [employer] (which disclaims liability or warranty for this information). This care instruction is for use with your licensed healthcare professional. If you have questions about a medical condition or this instruction, always ask your healthcare professional. [employer]wise disclaims any warranty or liability for your use of this information.  Content Version: 8.8.72453; Last Revised: January 14, 2010English   Spanish  Hyperlipidemia: After Your Visit  Your Care Instructions  Hyperlipidemia is too much fat in your blood. The body has several kinds of fat, including cholesterol and triglycerides. Your body needs fat for many things, such as making new cells. But too much fat in your blood increases your chances of having a heart attack or stroke.  You may be able to lower your cholesterol and triglycerides with a heart-healthy diet, exercise, and if needed, medicine. Your doctor may want you to try lifestyle changes first to see whether they lower the fat in your blood. You may need to take medicine if lifestyle changes do not lower the fat in your blood enough.  Follow-up care is a key part of your treatment and safety. Be sure to make and go to all appointments, and call your doctor if you are having problems. It???s also a good idea to know your test results and keep a list of the medicines you take.  How can you care for yourself at home?  Take your medicines   ?? Take your medicines exactly as prescribed. Call your doctor if you think you are having a problem with your medicine.   ?? If  you take medicine to lower your cholesterol, go to follow-up visits. You will need to have blood tests.   ?? Do not take large doses of niacin, which is a B vitamin, while taking medicine called statins. It may increase the chance of muscle pain and liver problems.   ?? Talk to your doctor about avoiding grapefruit juice if you are taking statins. Grapefruit juice can raise the level of this medicine in your blood. This could increase side effects.  Eat more fruits, vegetables, and fiber  ?? Fruits and vegetables have lots of nutrients that help protect against heart disease, and they have little???if any???fat. Try to eat at least five servings a day. Dark green, deep orange, or yellow fruits and vegetables are healthy choices.   ?? Keep carrots, celery, and other veggies handy for snacks. Buy fruit that is in season and store it where you can see it so that you will be tempted to eat it. Cook dishes that have a lot of veggies in them, such as stir-fries and soups.   ?? Foods high in fiber may reduce your cholesterol and provide important vitamins and minerals. High-fiber foods include whole-grain cereals and breads, oatmeal, beans, brown rice, citrus fruits, and apples.   ?? Buy whole-grain breads and cereals instead of white bread and pastries.  Limit saturated fat  ?? Read food labels and try to avoid saturated fat and trans fat. They increase your risk of heart disease.   ?? Use olive or canola oil when you cook. Try cholesterol-lowering spreads, such as Benecol or Take Control.   ?? Bake, broil, grill, or steam foods instead of frying them.   ?? Limit the amount of high-fat meats you eat, including hot dogs and sausages. Cut out all visible fat when you prepare meat.    ?? Eat fish, skinless poultry, and soy products such as tofu instead of high-fat meats. Soybeans may be especially good for your heart. Eat at least two servings of fish a week. Certain fish, such as salmon, contain omega-3 fatty acids, which may help reduce your risk of heart attack.   ?? Choose low-fat or fat-free milk and dairy products.  Get exercise, limit alcohol, and quit smoking  ?? Get more exercise. Work with your doctor to set up an exercise program. Even if you can do only a small amount, exercise will help you get stronger, have more energy, and manage your weight and your stress. Walking is an easy way to get exercise. Gradually increase the amount you walk every day. Aim for at least 30 minutes on most days of the week. You also may want to swim, bike, or do other activities.   ?? Limit alcohol to no more than 2 drinks a day for men and 1 drink a day for women.   ?? Do not smoke. If you need help quitting, talk to your doctor about stop-smoking programs and medicines. These can increase your chances of quitting for good.  When should you call for help?  Call 911 anytime you think you may need emergency care. For example, call if:  ?? You have symptoms of a heart attack, such as:   ?? Chest pain or pressure.   ?? Sweating.   ?? Shortness of breath.   ?? Nausea or vomiting.   ?? Pain that spreads from the chest to the neck, jaw, or one or both shoulders or arms.   ?? Dizziness or lightheadedness.   ?? A fast or uneven  pulse.  After calling 911, chew 1 adult-strength aspirin. Wait for an ambulance. Do not try to drive yourself.   ?? You have signs of a stroke. These may include:   ?? Sudden numbness, paralysis, or weakness in your face, arm, or leg, especially on only one side of your body.   ?? New problems with walking or balance.   ?? Sudden vision changes.   ?? Drooling or slurred speech.   ?? New problems speaking or understanding simple statements, or feeling confused.    ?? A sudden, severe headache that is different from past headaches.  ?? You passed out (lost consciousness).  Call your doctor now or seek immediate medical care if:  ?? You have muscle pain or weakness.  Watch closely for changes in your health, and be sure to contact your doctor if:  ?? You are very tired.   ?? You have an upset stomach, gas, constipation, or belly pain or cramps.    Where can you learn more?   Go to [email or web address]  Enter C406 in the search box to learn more about "Hyperlipidemia: After Your Visit."   ?? 1995-2010 [employer]. Care instructions adapted under license by [employer] (which disclaims liability or warranty for this information). This care instruction is for use with your licensed healthcare professional. If you have questions about a medical condition or this instruction, always ask your healthcare professional. [employer]wise disclaims any warranty or liability for your use of this information.  Content Version: 8.8.72453; Last Revised: October 14, 2009English   Spanish  Learning About Anxiety Disorders  What are anxiety disorders?  Anxiety disorders are a type of medical condition in which you have severe anxiety that interferes with your life. Anxiety is an uncomfortable feeling of fear, uneasiness, or concern that something bad is about to happen.  Anxiety disorders include conditions such as:  ?? Generalized anxiety disorder. You feel worried and stressed about many everyday events and activities. This goes on for several months and disrupts your life on most days.   ?? Panic disorder. You have repeated panic attacks. A panic attack is a sudden, intense fear or anxiety that may make you short of breath or dizzy or make your heart pound.   ?? Social anxiety disorder. You feel extremely anxious about what you will say or do in front of other people. This problem affects your daily life.    ?? Phobias. You are extremely afraid of a specific object, situation, or activity.  What are the symptoms?  Generalized anxiety disorder  Symptoms may include:  ?? Feeling worried and stressed about many things almost every day.   ?? Physical symptoms such as:   ?? Feeling tired or irritable, or having a hard time concentrating.   ?? Having headaches or muscle aches.   ?? Having a hard time swallowing.   ?? Feeling shaky, sweating, or having hot flashes.  Panic disorder  With panic disorder, you may have repeated panic attacks when there is no reason for feeling afraid. You may change your daily activities because you worry that you will have another attack.  Symptoms of panic attacks may include:  ?? A feeling of intense fear, terror, or anxiety.   ?? Trouble breathing or very fast breathing.   ?? Chest pain or tightness.   ?? A heartbeat that races or is not regular.  Social anxiety disorder  Symptoms may include:  ?? Fear about a social situation, such as eating in front of others  or speaking in public. You may worry a lot or be afraid that something bad will happen.   ?? Anxiety that can cause you to blush, sweat, and feel shaky.   ?? A heartbeat that is faster than normal.   ?? Having a hard time focusing.  Phobias  Symptoms may include:  ?? Feeling more afraid than most people of being around an object, being in a situation, or doing an activity. You might also be stressed about the possibility of being around the thing you fear.   ?? Worrying about losing control, panicking, fainting, or having physical symptoms like a faster heartbeat when you are around the situation or object.  How are anxiety disorders treated?  Anxiety disorders can be treated with medicines or counseling, or a combination of both. Medicines may include:  ?? Antidepressants, which may help your symptoms by keeping chemicals in your brain in balance.   ?? Benzodiazepines, which may provide short-term relief of anxiety symptoms.   Counseling may involve cognitive-behavioral therapy. A therapist helps you learn to think positive thoughts instead of ones that make you feel stressed and worried.  Healthy lifestyle  Healthy lifestyle changes may help you feel better.  ?? Get at least 30 minutes of exercise on most days of the week. Walking is a good choice.   ?? Eat a healthy diet. Include fruits, vegetables, lean proteins, and whole grains in your diet each day.   ?? Keep a regular sleep schedule. Try for 8 hours of sleep a night.   ?? Find ways to manage stress, such as relaxation exercises.   ?? Avoid alcohol and illegal drugs.  Follow-up care is a key part of your treatment and safety. Be sure to make and go to all appointments, and call your doctor if you are having problems. It's also a good idea to know your test results and keep a list of the medicines you take.    Where can you learn more?   Go to [email or web address]  Enter [id number] in the search box to learn more about "Learning About Anxiety Disorders."   ?? 1995-2010 [employer]. Care instructions adapted under license by [employer] (which disclaims liability or warranty for this information). This care instruction is for use with your licensed healthcare professional. If you have questions about a medical condition or this instruction, always ask your healthcare professional. [employer] disclaims any warranty or liability for your use of this information.  Content Version: 8.8.72453; Last Revised: February 3, 2010English   Spanish  Anxiety Disorder: After Your Visit  Your Care Instructions  Anxiety is a normal reaction to stress. Difficult situations can cause you to have symptoms such as sweaty palms and a nervous feeling of "butterflies in the stomach."   In an anxiety disorder, the symptoms are far more severe. Constant worry, muscle tension, trouble sleeping, nausea and diarrhea, and other symptoms can make normal daily activities difficult or impossible. These symptoms may occur for no reason, and they can affect your work, school, or social life. Treatment can usually help people with anxiety disorder.  Follow-up care is a key part of your treatment and safety. Be sure to make and go to all appointments, and call your doctor if you are having problems. It???s also a good idea to know your test results and keep a list of the medicines you take.  How can you care for yourself at home?  ?? Take medicines exactly as directed. Call your doctor if you think you  are having a problem with your medicine.   ?? Go to your counseling sessions and follow-up appointments.   ?? Recognize and accept your anxiety. Then, when you are in a situation that makes you anxious, say to yourself, ???This is not an emergency. I feel uncomfortable, but I am not in danger. I can keep going even if I feel anxious.???   ?? Be kind to your body:   ?? Relieve tension with exercise or a massage.   ?? Get enough rest.   ?? Avoid alcohol, caffeine, nicotine, and illegal drugs. They can increase your anxiety level and cause sleep problems.   ?? Learn and do relaxation techniques. See below for more about these techniques.  ?? Engage your mind. Get out and do something you enjoy. Go to a funny movie, or take a walk or hike. Plan your day. Having too much or too little to do can make you anxious.   ?? Keep a record of your symptoms. Discuss your fears with a good friend or family member, or join a support group for people with similar problems. Talking to others sometimes relieves stress.   ?? Get involved in social groups, or volunteer to help others. Being alone sometimes makes things seem worse than they are.    ?? Get at least 30 minutes of exercise on most days of the week to relieve stress. Walking is a good choice. You also may want to do other activities, such as running, swimming, cycling, or playing tennis or team sports.  Relaxation techniques  Do relaxation exercises 10 to 20 minutes a day. You can play soothing, relaxing music while you do them, if you wish.  ?? Tell others in your house that you are going to do your relaxation exercises. Ask them not to disturb you.   ?? Find a comfortable place, away from all distractions and noise.   ?? Lie down on your back, or sit with your back straight.   ?? Focus on your breathing. Make it slow and steady.   ?? Breathe in through your nose. Breathe out through either your nose or mouth.   ?? Breathe deeply, filling up the area between your navel and your rib cage. Breathe so that your belly goes up and down.   ?? Do not hold your breath.   ?? Breathe like this for 5 to 10 minutes. Notice the feeling of calmness throughout your whole body.  As you continue to breathe slowly and deeply, relax by doing the following for another 5 to 10 minutes:  ?? Tighten and relax each muscle group in your body. You can begin at your toes and work your way up to your head.   ?? Imagine your muscle groups relaxing and becoming heavy.   ?? Empty your mind of all thoughts.   ?? Let yourself relax more and more deeply.   ?? Become aware of the state of calmness that surrounds you.   ?? When your relaxation time is over, you can bring yourself back to alertness by moving your fingers and toes and then your hands and feet and then stretching and moving your entire body. Sometimes people fall asleep during relaxation, but they usually wake up shortly afterward.   ?? Always give yourself time to return to full alertness before you drive a car or do anything that might cause an accident if you are not fully alert. Never play a relaxation tape while you drive a car.  When should you  call for help?   Call 911 anytime you think you may need emergency care. For example, call if:  ?? You feel you cannot stop from hurting yourself or someone else.  Call your doctor now or seek immediate medical care if:  ?? A person with an anxiety problem mentions suicide. If a suicide threat seems real, with a specific plan and a way to carry it out, you should stay with the person, or ask someone you trust to stay with the person, until you get help.   ?? You have anxiety or irrational fear that upsets your daily activities.   ?? You have sudden, severe attacks of fear or anxiety with physical symptoms (shaking, sweating) that seem to occur for no reason.  Watch closely for changes in your health, and be sure to contact your doctor if:  ?? You have symptoms of anxiety that are new or different from those you had before.   ?? You do not get better as expected.    Where can you learn more?   Go to [email or web address]  Enter P754 in the search box to learn more about "Anxiety Disorder: After Your Visit."   ?? 1995-2010 [employer]. Care instructions adapted under license by [employer] (which disclaims liability or warranty for this information). This care instruction is for use with your licensed healthcare professional. If you have questions about a medical condition or this instruction, always ask your healthcare professional. [employer] disclaims any warranty or liability for your use of this information.  Content Version: 8.8.72453; Last Revised: July 1, 2009English   Spanish  Anxiety Disorder: After Your Visit  Your Care Instructions  Anxiety is a normal reaction to stress. Difficult situations can cause you to have symptoms such as sweaty palms and a nervous feeling of "butterflies in the stomach."   In an anxiety disorder, the symptoms are far more severe. Constant worry, muscle tension, trouble sleeping, nausea and diarrhea, and other symptoms can make normal daily activities difficult or impossible. These symptoms may occur for no reason, and they can affect your work, school, or social life. Treatment can usually help people with anxiety disorder.  Follow-up care is a key part of your treatment and safety. Be sure to make and go to all appointments, and call your doctor if you are having problems. It???s also a good idea to know your test results and keep a list of the medicines you take.  How can you care for yourself at home?  ?? Take medicines exactly as directed. Call your doctor if you think you are having a problem with your medicine.   ?? Go to your counseling sessions and follow-up appointments.   ?? Recognize and accept your anxiety. Then, when you are in a situation that makes you anxious, say to yourself, ???This is not an emergency. I feel uncomfortable, but I am not in danger. I can keep going even if I feel anxious.???   ?? Be kind to your body:   ?? Relieve tension with exercise or a massage.   ?? Get enough rest.   ?? Avoid alcohol, caffeine, nicotine, and illegal drugs. They can increase your anxiety level and cause sleep problems.   ?? Learn and do relaxation techniques. See below for more about these techniques.  ?? Engage your mind. Get out and do something you enjoy. Go to a funny movie, or take a walk or hike. Plan your day. Having too much or too little to do can make you  anxious.   ?? Keep a record of your symptoms. Discuss your fears with a good friend or family member, or join a support group for people with similar problems. Talking to others sometimes relieves stress.   ?? Get involved in social groups, or volunteer to help others. Being alone sometimes makes things seem worse than they are.    ?? Get at least 30 minutes of exercise on most days of the week to relieve stress. Walking is a good choice. You also may want to do other activities, such as running, swimming, cycling, or playing tennis or team sports.  Relaxation techniques  Do relaxation exercises 10 to 20 minutes a day. You can play soothing, relaxing music while you do them, if you wish.  ?? Tell others in your house that you are going to do your relaxation exercises. Ask them not to disturb you.   ?? Find a comfortable place, away from all distractions and noise.   ?? Lie down on your back, or sit with your back straight.   ?? Focus on your breathing. Make it slow and steady.   ?? Breathe in through your nose. Breathe out through either your nose or mouth.   ?? Breathe deeply, filling up the area between your navel and your rib cage. Breathe so that your belly goes up and down.   ?? Do not hold your breath.   ?? Breathe like this for 5 to 10 minutes. Notice the feeling of calmness throughout your whole body.  As you continue to breathe slowly and deeply, relax by doing the following for another 5 to 10 minutes:  ?? Tighten and relax each muscle group in your body. You can begin at your toes and work your way up to your head.   ?? Imagine your muscle groups relaxing and becoming heavy.   ?? Empty your mind of all thoughts.   ?? Let yourself relax more and more deeply.   ?? Become aware of the state of calmness that surrounds you.   ?? When your relaxation time is over, you can bring yourself back to alertness by moving your fingers and toes and then your hands and feet and then stretching and moving your entire body. Sometimes people fall asleep during relaxation, but they usually wake up shortly afterward.   ?? Always give yourself time to return to full alertness before you drive a car or do anything that might cause an accident if you are not fully alert. Never play a relaxation tape while you drive a car.  When should you call for help?   Call 911 anytime you think you may need emergency care. For example, call if:  ?? You feel you cannot stop from hurting yourself or someone else.  Call your doctor now or seek immediate medical care if:  ?? A person with an anxiety problem mentions suicide. If a suicide threat seems real, with a specific plan and a way to carry it out, you should stay with the person, or ask someone you trust to stay with the person, until you get help.   ?? You have anxiety or irrational fear that upsets your daily activities.   ?? You have sudden, severe attacks of fear or anxiety with physical symptoms (shaking, sweating) that seem to occur for no reason.  Watch closely for changes in your health, and be sure to contact your doctor if:  ?? You have symptoms of anxiety that are new or different from those you  had before.   ?? You do not get better as expected.    Where can you learn more?   Go to [email or web address]  Enter P754 in the search box to learn more about "Anxiety Disorder: After Your Visit."   ?? 1995-2010 [employer]. Care instructions adapted under license by [employer] (which disclaims liability or warranty for this information). This care instruction is for use with your licensed healthcare professional. If you have questions about a medical condition or this instruction, always ask your healthcare professional. Healthwise disclaims any warranty or liability for your use of this information.  Content Version: 8.8.72453; Last Revised: April 15, 2008

## 2010-03-01 NOTE — Progress Notes (Signed)
Here for b/p recheck and to refill meds. Husband is suffering from CA and is currently home from hospital , pt is setting up hospice, very tearful.

## 2010-03-01 NOTE — Progress Notes (Signed)
HISTORY OF PRESENT ILLNESS  Kathryn [name] is a 74 y.o. female presents with Medication Refill, Hypertension, Chest Pain and Stress    Agree with nurse note.  Subjective:  Kathryn [name] presents to our office for a blood pressure check, medication refills, stress and other concerns.    She is very upset about her husband's health.  He has cancer and has been admitted to hospice.  She has been very tearful and has had episodes of her heart racing, chest discomfort lately.  Her last episode of chest pain was two weeks ago.  She denies any diaphoresis, increased fatigue, radiation of pain or other associated symptoms.  She feels like she has anxiety and is hoping to receive a medication she can use as needed.  She is especially stressed by the fact that her husband's family will be coming soon to visit and she has never had a good relationship with them.    She thought she needed refills of her medications but most are not due until November.  She has a history of high blood pressure but otherwise denies headaches, dizziness, blurry vision, cough, wheeze, sneeze or any swelling anywhere.    [facility]leh       She needs a refill of Celebrex.    ROS    Review of Systems negative except as noted above in HPI.    PHYSICAL EXAMINATION    Vital Signs  BP 116/78   Pulse 86   Temp(Src) 97.8 ??F (36.6 ??C) (Oral)   Ht 5\' 2"  (1.575 m)   Wt 174 lb (78.926 kg)   BMI 31.83 kg/m2   SpO2 98%    General appearance - Well nourished. Well appearing.  Well developed.  No acute distress. Overweight.   Head - Normocephalic.  Atraumatic.    Eyes - pupils equal and reactive. Extraocular eye movements intact. Sclera anicteric.  Mildly injected sclera.  Ears - Hearing is grossly normal bilaterally.      Nose - normal and patent.  No polyps noted.  No erythema.  No discharge.    Mouth - mucous membranes with adequate moisture.  Posterior pharynx normal with cobblestone appearance.  No erythema, white exudate or obstruction.   Neck - supple.  Midline trachea.  No carotid bruits noted bilaterally.  No thyromegaly noted.  Chest - clear to auscultation bilaterally anterriorly and posteriorly.  No wheezes.  No rales or rhonchi.  Breath sounds are symmetrical bilaterally.  Unlabored respirations.  Heart - normal rate.  Regular rhythm.  Normal S1, S2.  No murmur noted.  No rubs, clicks or gallops noted.  Abdomen - soft and nondistended.  No masses or organomegaly.  No rebound, rigidity or guarding.  Bowel sounds normal x 4 quadrants.  No tenderness noted.  Back exam - normal range of motion.  No pain on palpation of the spinous processes in the cervical, thoracic, lumbar, sacral regions.  No CVA tenderness.    Neurological - awake, alert and oriented to person, place, and time and event.  Cranial nerves II through XII intact.  Clear speech.  Muscle strength is +5/5 x 4 extremities.  Sensation is intact to light touch bilaterally.  Steady gait.   Musculoskeletal - Intact x 4 extremities.  Full ROM x 4 extremities.  No pain with movement.    Heme/Lymph - peripheral pulses normal x 4 extremities.  No peripheral edema is noted.  No cervical adenopathy noted.  Skin - no rashes, erythema, ecchymosis, lacerations, abrasions, suspicious moles noted  Psychological -   normal behavior, dress and thought processes.  Good insight. Good eye contact.  Normal affect.  Tearful mood.  Normal speech.    ASSESSMENT and PLAN    1. Chest pain (786.8F)  AMB POC EKG ROUTINE W/ 12 LEADS, INTER & REP, REFERRAL TO CARDIOLOGY   2. Hypertension (401.9AJ)  VITAMIN D, 25 HYDROXY, METABOLIC PANEL, COMPREHENSIVE, LIPID PANEL, URINALYSIS W/ RFLX MICROSCOPIC    stable     3. Hypercholesterolemia (272.0L)  VITAMIN D, 25 HYDROXY, METABOLIC PANEL, COMPREHENSIVE, LIPID PANEL   4. Adjustment disorder with anxiety (309.24)  alprazolam (XANAX) 0.5 mg tablet   5. Unspecified vitamin D deficiency (268.9)  VITAMIN D, 25 HYDROXY    6. 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 Hematuria of undiagnosed cause (599.70U)         cardiovascular risk and specific lipid/LDL goals reviewed  use of aspirin to prevent MI and TIA's discussed    Continue current medications and care.  Start Xanax 1/2 - 1 po TID prn.  Caution about drowsiness.  Prescriptions written and sent to pharmacy.  Medication side effects discussed.  Most recent labs reviewed.  Recheck pertinent labs today.  Advise grief counseling.  Addressed weight, diet and exercise with patient.  Counseled patient on: Patient health concerns.  Stressors.  Relevant handouts given and discussed with patient.  Immunizations noted.    Offered empathy, support, legitamation, prayers, partnership to patient.    Follow-up Disposition:  Return in about 3 months (around 06/01/2010) for bp, anxiety, med check.    More than 30 mins spent with patient with more than 50% of this time spent in counseling or coordinating care.

## 2010-03-02 LAB — METABOLIC PANEL, COMPREHENSIVE
A-G Ratio: 1.5 (ref 1.1–2.5)
ALT (SGPT): 17 IU/L (ref 0–40)
AST (SGOT): 23 IU/L (ref 0–40)
Albumin: 4.2 g/dL (ref 3.5–4.8)
Alk. phosphatase: 81 IU/L (ref 25–165)
BUN/Creatinine ratio: 17 (ref 11–26)
BUN: 13 mg/dL (ref 8–27)
Bilirubin, total: 0.3 mg/dL (ref 0.0–1.2)
CO2: 24 mmol/L (ref 20–32)
Calcium: 10 mg/dL (ref 8.6–10.2)
Chloride: 104 mmol/L (ref 97–108)
Creatinine: 0.75 mg/dL (ref 0.57–1.00)
GFR est AA: 92 mL/min/{1.73_m2} (ref >59)
GFR est non-AA: 80 mL/min/{1.73_m2} (ref >59)
GLOBULIN, TOTAL: 2.8 g/dL (ref 1.5–4.5)
Glucose: 98 mg/dL (ref 65–99)
Potassium: 4 mmol/L (ref 3.5–5.2)
Protein, total: 7 g/dL (ref 6.0–8.5)
Sodium: 141 mmol/L (ref 135–145)

## 2010-03-02 LAB — URINALYSIS W/ RFLX MICROSCOPIC
Bilirubin: NEGATIVE
Glucose: NEGATIVE
Ketone: NEGATIVE
Nitrites: NEGATIVE
Specific Gravity: 1.021 (ref 1.005–1.030)
Urobilinogen: 0.2 mg/dL (ref 0.0–1.9)
pH (UA): 5.5 (ref 5.0–7.5)

## 2010-03-02 LAB — MICROSCOPIC EXAMINATION: WBC: NONE SEEN /hpf (ref 0–?)

## 2010-03-02 LAB — LIPID PANEL
Cholesterol, total: 186 mg/dL (ref 100–199)
HDL Cholesterol: 49 mg/dL (ref >39)
LDL, calculated: 99 mg/dL (ref 0–99)
Triglyceride: 190 mg/dL — ABNORMAL HIGH (ref 0–149)
VLDL, calculated: 38 mg/dL (ref 5–40)

## 2010-03-02 LAB — VITAMIN D, 25 HYDROXY: Vitamin D 25-Hydroxy: 39.8 ng/mL (ref 32.0–100.0)

## 2010-03-22 ENCOUNTER — Encounter

## 2010-03-22 MED ORDER — RALOXIFENE 60 MG TAB
60 mg | ORAL_TABLET | Freq: Every day | ORAL | Status: DC
Start: 2010-03-22 — End: 2011-03-12

## 2010-03-22 NOTE — Telephone Encounter (Signed)
Refill sent.

## 2010-06-08 MED ORDER — PRAVASTATIN 80 MG TAB
80 mg | ORAL_TABLET | Freq: Every day | ORAL | Status: DC
Start: 2010-06-08 — End: 2011-03-10

## 2010-06-08 MED ORDER — FUROSEMIDE 20 MG TAB
20 mg | ORAL_TABLET | ORAL | Status: DC | PRN
Start: 2010-06-08 — End: 2010-10-15

## 2010-06-08 MED ORDER — VALSARTAN 160 MG TAB
160 mg | ORAL_TABLET | Freq: Every day | ORAL | Status: DC
Start: 2010-06-08 — End: 2010-08-23

## 2010-06-08 MED ORDER — VALACYCLOVIR 500 MG TAB
500 mg | ORAL_TABLET | Freq: Every day | ORAL | Status: DC
Start: 2010-06-08 — End: 2011-05-25

## 2010-06-08 MED ORDER — CELECOXIB 200 MG CAP
200 mg | ORAL_CAPSULE | Freq: Two times a day (BID) | ORAL | Status: DC
Start: 2010-06-08 — End: 2011-05-04

## 2010-06-08 MED ORDER — ALPRAZOLAM 0.5 MG TAB
0.5 mg | ORAL_TABLET | Freq: Three times a day (TID) | ORAL | Status: DC | PRN
Start: 2010-06-08 — End: 2010-09-05

## 2010-06-08 NOTE — Progress Notes (Signed)
Husband passed away in June, the day after Father's Day. (They had no children). Having depression sxs. Needs refills of all meds. Is almost out of Xanax.

## 2010-06-08 NOTE — Patient Instructions (Signed)
English   Spanish  Adjustment Disorder: After Your Visit  Your Care Instructions  Adjustment disorder occurs when you develop emotional or behavioral problems in response to stress. In an adjustment disorder, the response to the stress is far more severe than a normal response should be. It is severe enough to affect your work or social life and may cause depression and physical pains and problems. Events that may cause this response can include a divorce, money problems, starting school or a new job, or anything that normally causes some stress.  An adjustment disorder is usually a short-term problem. It occurs within 3 months of the stressful event or change. If the response lasts longer than 6 months after the event ends, you may have a more serious disorder.  Follow-up care is a key part of your treatment and safety. Be sure to make and go to all appointments, and call your doctor if you are having problems. It???s also a good idea to know your test results and keep a list of the medicines you take.  How can you care for yourself at home?  ?? Go to all scheduled counseling sessions. Do not skip any because you are feeling better.   ?? If your doctor prescribed medicines, take them exactly as prescribed. Call your doctor if you think you are having a problem with your medicine. You will get more details on the specific medicines your doctor prescribes.   ?? Discuss the causes of your stress with a good friend or family member, or join a support group for people with similar problems. Talking to others sometimes relieves stress.   ?? Get at least 30 minutes of exercise on most days of the week. Walking is a good choice. You also may want to do other activities, such as running, swimming, cycling, or playing tennis or team sports.  Relaxation techniques  Do relaxation exercises 10 to 20 minutes a day. You can play soothing, relaxing music while you do them, if you wish.   ?? Tell others in your house that you are going to do your relaxation exercises. Ask them not to disturb you.   ?? Find a comfortable place, away from all distractions and noise.   ?? Lie down on your back, or sit with your back straight.   ?? Focus on your breathing. Make it slow and steady.   ?? Breathe in through your nose. Breathe out through either your nose or mouth.   ?? Breathe deeply, filling up the area between your navel and your rib cage. Breathe so that your belly goes up and down.   ?? Do not hold your breath.   ?? Breathe like this for 5 to 10 minutes. Notice the feeling of calmness throughout your whole body.  As you continue to breathe slowly and deeply, relax by doing the following for another 5 to 10 minutes:  ?? Tighten and relax each muscle group in your body. You can begin at your toes and work your way up to your head.   ?? Imagine your muscle groups relaxing and becoming heavy.   ?? Empty your mind of all thoughts.   ?? Let yourself relax more and more deeply.   ?? Become aware of the state of calmness that surrounds you.   ?? When your relaxation time is over, you can bring yourself back to alertness by moving your fingers and toes and then your hands and feet and then stretching and moving your entire body. Sometimes  people fall asleep during relaxation, but they usually wake up shortly afterward.   ?? Always give yourself time to return to full alertness before you drive a car or do anything that might cause an accident if you are not fully alert. Never play a relaxation tape while you drive a car.  When should you call for help?  Call 911 anytime you think you may need emergency care. For example, call if:  ?? You feel you cannot stop from hurting yourself or someone else.  Watch closely for changes in your health, and be sure to contact your doctor if:  ?? You cannot go to your counseling sessions.   ?? You do not get better as expected.     Where can you learn more?    Go to [email or web address]  Enter R087 in the search box to learn more about "Adjustment Disorder: After Your Visit."   ?? 2006-2011 [employer]. Care instructions adapted under license by [employer] (which disclaims liability or warranty for this information). This care instruction is for use with your licensed healthcare professional. If you have questions about a medical condition or this instruction, always ask your healthcare professional. [employer] disclaims any warranty or liability for your use of this information.  Content Version: 8.9.83828; Last Revised: July 1, 2009English   Spanish  Grieving (Actual/Anticipated): After Your Visit  Your Care Instructions  Grief is your emotional reaction to a major loss. The words "sorrow" and "heartache" often are used to describe feelings of grief. You feel grief when you lose a beloved person, pet, place, or thing. It is also natural to feel grief when you lose a valued way of life, such as a job, marriage, or good health.  You may begin to grieve before a loss occurs. You may grieve for a loved one who is sick and dying. Children and adults often feel the pain of loss before a big move or divorce. This type of grief helps you get ready for a loss.  Grief is different for each person. There is no "normal" or "expected" period of time for grieving. Some people adjust to their loss within a couple of months. Others may take 2 years or longer, especially if their lives were changed a lot or if the loss was sudden and shocking.  Grieving can cause problems such as headaches, loss of appetite, and trouble with thinking or sleeping. You may withdraw from friends and family and behave in ways that are unusual for you. Grief may cause you to question your beliefs and views about life.   Grief is natural and does not require medical treatment. But if you have trouble sleeping, it may help to take sleeping pills for a short time. It may help to talk with people who have been through or are going through similar losses. You may also want to talk to a counselor about your feelings. Talking about your loss, sharing your cares and concerns, and getting support from others are important parts of healthy grieving.  Follow-up care is a key part of your treatment and safety. Be sure to make and go to all appointments, and call your doctor if you are having problems. It???s also a good idea to know your test results and keep a list of the medicines you take.  How can you care for yourself at home?  ?? Get enough sleep. Your mind helps make sense of your life while you sleep. Missing sleep can lead to illness and  make it harder for you to deal with your grief.   ?? Eat healthy foods. Try to avoid eating only foods that give you comfort. Ask someone to join you for a meal if you do not like eating alone. Take a daily vitamin.   ?? Get some exercise every day. Even a walk can help you deal with your grief. Other exercises, such as yoga, can also help you manage stress.   ?? Comfort yourself. Take time to look at photos or use special items that make you feel better.   ?? Stay involved in your life. Do not withdraw from the activities you enjoy. People you know at work, church, clubs, or other groups can help you get through your period of grief.   ?? Think about joining a support group to help you deal with your grief. There are many support groups to help people recover from grief.  When should you call for help?  Be sure to contact your doctor if:  ?? You feel that life is meaningless, or you think about killing yourself.   ?? A grieving person you know talks about hurting himself or herself.   ?? You have any of the following problems that last for 2 or more weeks:   ?? You feel sad a lot or cry all the time.    ?? You have trouble sleeping, or you sleep too much.   ?? You find it hard to concentrate, make decisions, or remember things.   ?? You change how you normally eat.   ?? You feel guilty about the death or loss you have suffered.   ?? You are using alcohol or drugs to help you cope with your loss.     Where can you learn more?   Go to [email or web address]  Enter H249 in the search box to learn more about "Grieving (Actual/Anticipated): After Your Visit."   ?? 2006-2011 [employer]. Care instructions adapted under license by [employer] (which disclaims liability or warranty for this information). This care instruction is for use with your licensed healthcare professional. If you have questions about a medical condition or this instruction, always ask your healthcare professional. [employer] disclaims any warranty or liability for your use of this information.  Content Version: 8.9.83828; Last Revised: January 14, 2010English   Spanish  Leg and Ankle Edema: After Your Visit  Your Care Instructions  Swelling in the legs, ankles, and feet is called edema. It is common after you sit or stand for a while. Long plane flights or car rides often cause swelling in the legs and feet. You may also have swelling if you have to stand for long periods of time at your job. Problems with the veins in the legs (varicose veins) and changes in hormones can also cause swelling. Sometimes the swelling in the ankles and feet is caused by a more serious problem, such as heart failure, infection, blood clots, or liver or kidney disease.  Follow-up care is a key part of your treatment and safety. Be sure to make and go to all appointments, and call your doctor if you are having problems. It???s also a good idea to know your test results and keep a list of the medicines you take.  How can you care for yourself at home?   ?? If your doctor gave you medication, take it as prescribed. Call your doctor if you think you are having a problem with your medicine.   ?? Whenever you are  resting, raise your legs up. Try to keep the swollen area higher than the level of your heart.   ?? Take breaks from standing or sitting in one position.   ?? Walk around to increase the blood flow in your lower legs.   ?? Move your feet and ankles often while you stand, or tighten and relax your leg muscles.  ?? Wear support stockings. Put them on in the morning, before swelling gets worse.   ?? Eat a balanced diet. Lose weight if you need to.   ?? Limit the amount of salt (sodium) in your diet. Salt holds fluid in the body and may increase swelling.  When should you call for help?  Call 911 anytime you think you may need emergency care. For example, call if:  ?? You have sudden chest pain and shortness of breath, or you cough up blood.  Call your doctor now or seek immediate medical care if:  ?? You have signs of a blood clot, such as:   ?? Pain in your calf, back of the knee, thigh, or groin.   ?? Redness and swelling in your leg or groin.  ?? You have sudden pain, tingling. weakness, or numbness in your legs or feet.   ?? The skin of your legs or feet is pale or cool or changes color.   ?? You have signs of infection, such as redness or a fever.   ?? Swelling increases or spreads or does not improve with home treatment.  Watch closely for changes in your health, and be sure to contact your doctor if:  ?? You do not get better as expected.     Where can you learn more?   Go to [email or web address]  Enter [phone] in the search box to learn more about "Leg and Ankle Edema: After Your Visit."    ?? 2006-2011 [employer]. Care instructions adapted under license by [employer] (which disclaims liability or warranty for this information). This care instruction is for use with your licensed healthcare professional. If you have questions about a medical condition or this instruction, always ask your healthcare professional. [employer] disclaims any warranty or liability for your use of this information.  Content Version: 8.9.83828; Last Revised: October 29, 2009  Go to ER if notice SOB that worsens with exertion or persists.  If you have chest discomfort "feeling like an elephant is sitting on your chest".  If it travels into the neck or down the arm.  If you break out in a cold clammy sweat with the chest discomfort.  Chew 2 baby aspirin and dial 911.  Do not drive yourself to the ER.  English   Spanish  Leg and Ankle Edema: After Your Visit  Your Care Instructions  Swelling in the legs, ankles, and feet is called edema. It is common after you sit or stand for a while. Long plane flights or car rides often cause swelling in the legs and feet. You may also have swelling if you have to stand for long periods of time at your job. Problems with the veins in the legs (varicose veins) and changes in hormones can also cause swelling. Sometimes the swelling in the ankles and feet is caused by a more serious problem, such as heart failure, infection, blood clots, or liver or kidney disease.  Follow-up care is a key part of your treatment and safety. Be sure to make and go to all appointments, and call your doctor if you are having  problems. It???s also a good idea to know your test results and keep a list of the medicines you take.  How can you care for yourself at home?  ?? If your doctor gave you medication, take it as prescribed. Call your doctor if you think you are having a problem with your medicine.    ?? Whenever you are resting, raise your legs up. Try to keep the swollen area higher than the level of your heart.   ?? Take breaks from standing or sitting in one position.   ?? Walk around to increase the blood flow in your lower legs.   ?? Move your feet and ankles often while you stand, or tighten and relax your leg muscles.  ?? Wear support stockings. Put them on in the morning, before swelling gets worse.   ?? Eat a balanced diet. Lose weight if you need to.   ?? Limit the amount of salt (sodium) in your diet. Salt holds fluid in the body and may increase swelling.  When should you call for help?  Call 911 anytime you think you may need emergency care. For example, call if:  ?? You have sudden chest pain and shortness of breath, or you cough up blood.  Call your doctor now or seek immediate medical care if:  ?? You have signs of a blood clot, such as:   ?? Pain in your calf, back of the knee, thigh, or groin.   ?? Redness and swelling in your leg or groin.  ?? You have sudden pain, tingling. weakness, or numbness in your legs or feet.   ?? The skin of your legs or feet is pale or cool or changes color.   ?? You have signs of infection, such as redness or a fever.   ?? Swelling increases or spreads or does not improve with home treatment.  Watch closely for changes in your health, and be sure to contact your doctor if:  ?? You do not get better as expected.     Where can you learn more?   Go to [email or web address]  Enter [phone] in the search box to learn more about "Leg and Ankle Edema: After Your Visit."    ?? 2006-2011 [employer]. Care instructions adapted under license by [employer] (which disclaims liability or warranty for this information). This care instruction is for use with your licensed healthcare professional. If you have questions about a medical condition or this instruction, always ask your healthcare professional. [employer] disclaims any warranty or liability for your use of this information.  Content Version: 8.9.83828; Last Revised: October 29, 2009

## 2010-06-08 NOTE — Progress Notes (Signed)
HISTORY OF PRESENT ILLNESS  Kathryn [name] is a 74 y.o. female presents with Depression, Medication Refill, Results, Hypertension, Cholesterol Problem and Weight Loss    Agree with nurse note.  Subjective:  Kathryn [name] presents to our office for blood pressure check, cholesterol check, referral follow up, medication refill, lab results, weight loss, and depression.      Her husband died on Father's Day from cancer.  They had no children.  She has been very sad, tearful, is not sleeping well.  Xanax has helped.  She takes one half pill only when she needs it.  She still has seven or eight pills from her last prescription of 40 pills with one refill, which was given to her in May.  She has lost presently 16 pounds since that visit, due to decreased appetite from grieving, but also because she stopped eating sweets hoping it would help her triglyceride level.  She had lab work performed and wants to discuss the results.     She needs refills of all of her medications.      She occasionally uses Lasix, although she infrequently uses it due to left ankle swelling.  It tends to help.      She continues to have chest pain, but her last episode was a week ago.  Her sister and mom both died of heart attacks.  She has an appointment with Dr. Abigail Miyamoto in October.  She did not want to see him sooner than that because she had an enough on her plate dealing with her husband.      She is tired and has had occasional pain with urination, and frequent urination, as well as lower abdominal pain.      No other complaints.      MedDATA/pag       ROS    Review of Systems negative except as noted above in HPI.    PHYSICAL EXAMINATION    Vital Signs  BP 110/64   Pulse 63   Temp 97.9 ??F (36.6 ??C)   Ht 5\' 2"  (1.575 m)   Wt 158 lb 6.4 oz (71.85 kg)   BMI 28.97 kg/m2   SpO2 97%    General appearance - Well nourished. Well appearing.  Well developed.  No acute distress. Overweight.   Head - Normocephalic.  Atraumatic.     Eyes - pupils equal and reactive. Extraocular eye movements intact. Sclera anicteric.  Mildly injected sclera.  Ears - Hearing is grossly normal bilaterally.      Nose - normal and patent.  No polyps noted.  No erythema.  No discharge.    Mouth - mucous membranes with adequate moisture.  Posterior pharynx normal with cobblestone appearance.  No erythema, white exudate or obstruction.  Neck - supple.  Midline trachea.  No carotid bruits noted bilaterally.  No thyromegaly noted.  Chest - clear to auscultation bilaterally anterriorly and posteriorly.  No wheezes.  No rales or rhonchi.  Breath sounds are symmetrical bilaterally.  Unlabored respirations.  Heart - normal rate.  Regular rhythm.  Normal S1, S2.  No murmur noted.  No rubs, clicks or gallops noted.  Abdomen - soft and nondistended.  No masses or organomegaly.  No rebound, rigidity or guarding.  Bowel sounds normal x 4 quadrants.  No tenderness noted.  Back exam - normal range of motion.  No pain on palpation of the spinous processes in the cervical, thoracic, lumbar, sacral regions.  No CVA tenderness.    Neurological -  awake, alert and oriented to person, place, and time and event.  Cranial nerves II through XII intact.  Clear speech.  Muscle strength is +5/5 x 4 extremities.  Sensation is intact to light touch bilaterally.  Steady gait.   Musculoskeletal - Intact x 4 extremities.  Full ROM x 4 extremities.  No pain with movement.    Heme/Lymph - peripheral pulses normal x 4 extremities.  No peripheral edema is noted.  No cervical adenopathy noted.  Skin - no rashes, erythema, ecchymosis, lacerations, abrasions, suspicious moles noted  Psychological -   normal behavior, dress and thought processes.  Good insight. Good eye contact.  Normal affect.  Tearful mood.  Normal speech.    ASSESSMENT and PLAN    1. Essential hypertension, benign (401.1)  valsartan (DIOVAN) 160 mg tablet   2. Hematuria (599.56F)  CULTURE, URINE, URINALYSIS W/ RFLX MICROSCOPIC    3. Other malaise and fatigue (780.79)  CULTURE, URINE, URINALYSIS W/ RFLX MICROSCOPIC   4. Adjustment disorder with anxiety (309.24)  alprazolam (XANAX) 0.5 mg tablet   5. Weight loss (783.21P)      due to diet changes vs other   6. Other and unspecified hyperlipidemia (272.4)  pravastatin (PRAVACHOL) 80 mg tablet   7. 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 Peripheral edema (782.3Z)  furosemide (LASIX) 20 mg tablet   10. Unspecified vitamin D deficiency (268.9)     11. Insomnia (780.52A)      due to grief   12. Chest pain (786.50F)      with EKG changes, improved   13. Ankle edema (782.3BQ)      Left, improved       cardiovascular risk and specific lipid/LDL goals reviewed  use of aspirin to prevent MI and TIA's discussed    Controlled Substance Agreement reviewed and signed  Continue current medications and care  Prescription given to patient during office visit today.  Most recent tests reviewed  Recheck pertinent labs 08/2010  Addressed weight, diet and exercise with patient  Counseled patient on health concerns:  Stressors.  Grief.    Relevant handouts given and discussed with patient  Immunizations noted;   Offered empathy, support, legitamation, prayers, partnership to patient  Follow-up Disposition:  Return in about 3 months (around 09/08/2010) for stress, med check.  More than 30 mins spent with patient with more than 50% of this time spent in counseling or coordinating care

## 2010-06-10 LAB — CULTURE, URINE

## 2010-06-10 LAB — URINALYSIS W/ RFLX MICROSCOPIC
Bilirubin: NEGATIVE
Glucose: NEGATIVE
Ketone: NEGATIVE
Leukocyte Esterase: NEGATIVE
Nitrites: NEGATIVE
Specific Gravity: 1.019 (ref 1.005–1.030)
Urobilinogen: 0.2 mg/dL (ref 0.0–1.9)
pH (UA): 6 (ref 5.0–7.5)

## 2010-06-10 LAB — MICROSCOPIC EXAMINATION: Bacteria: NONE SEEN

## 2010-06-10 NOTE — Progress Notes (Addendum)
Quick Note:    No UTI.  ______

## 2010-06-14 NOTE — Progress Notes (Addendum)
Quick Note:    Pt advised.  ______

## 2010-07-08 ENCOUNTER — Encounter

## 2010-07-13 MED ORDER — HYDROCODONE 10 MG-CHLORPHENIRAMINE 8 MG/5 ML ORAL SUSP EXTEND.REL 12HR
10-8 mg/5 mL | Freq: Two times a day (BID) | ORAL | Status: DC | PRN
Start: 2010-07-13 — End: 2011-02-21

## 2010-07-13 MED ORDER — ALBUTEROL SULFATE HFA 90 MCG/ACTUATION AEROSOL INHALER
90 mcg/actuation | Freq: Four times a day (QID) | RESPIRATORY_TRACT | Status: DC | PRN
Start: 2010-07-13 — End: 2011-02-21

## 2010-07-13 MED ORDER — IPRATROPIUM-ALBUTEROL 2.5 MG-0.5 MG/3 ML NEB SOLUTION
2.5 mg-0.5 mg/3 ml | PACK | Freq: Once | RESPIRATORY_TRACT | Status: AC
Start: 2010-07-13 — End: 2010-07-13

## 2010-07-13 MED ORDER — AZITHROMYCIN 250 MG TAB
250 mg | ORAL_TABLET | ORAL | Status: AC
Start: 2010-07-13 — End: 2010-07-18

## 2010-07-13 MED ORDER — MOMETASONE 50 MCG/ACTUATION NASAL SPRAY
50 mcg/actuation | Freq: Every day | NASAL | Status: DC
Start: 2010-07-13 — End: 2011-02-21

## 2010-07-13 NOTE — Progress Notes (Signed)
HISTORY OF PRESENT ILLNESS  Kathryn [name] is a 74 y.o. female presents with Cough and Documentation    Agree with nurse note.  Subjective:  Kathryn [name] presents to our office with a complaint of cough, congestion and requesting a letter to be sent to [employer].    Since being with her brother in the hospital, who is having surgery performed, she has been experiencing cough with colored mucus, chest tightness, shortness of breath, occasional wheezing, nasal congestion, postnasal drainage for the past four to five days.  She denies any associated fever, chills or new swelling anywhere.  She has had some sore throat and sinus pressure and from time to time her sides are a little uncomfortable when she coughs.  She has had difficulty sleeping over the past 24 hours due to coughing so badly.  Overall she feels like the symptoms are slightly better but wonders if she needs a Z-Pak before they get worse.    She shows me a letter today from the [employer] who are requesting more information about the patient's past medical history of hematuria.  They want to know the cause of it.  She is hoping to get the letter today.  She denies any back pain, abdominal pain or blood in her urine today.    MedDATA/leh       ROS    Review of Systems negative except as noted above in HPI.    PHYSICAL EXAMINATION    Vital Signs  BP 112/58   Pulse 71   Temp(Src) 97.9 ??F (36.6 ??C) (Oral)   Ht 5\' 2"  (1.575 m)   Wt 158 lb 12.8 oz (72.031 kg)   BMI 29.04 kg/m2   SpO2 96%    General appearance - Well nourished. Well appearing.  Well developed.  No acute distress. Overweight.  Talks nasally.  Head - Normocephalic.  Atraumatic.  Non tender sinuses x 4.  Eyes - pupils equal and reactive. Extraocular eye movements intact. Sclera anicteric.  Mildly injected sclera.  Ears - Hearing is grossly normal bilaterally.      Nose - normal and patent.  No polyps noted.  No erythema.  No discharge.     Mouth - mucous membranes with adequate moisture.  Posterior pharynx normal with cobblestone appearance.  No erythema, white exudate or obstruction.  Neck - supple.  Midline trachea.  No carotid bruits noted bilaterally.  No thyromegaly noted.  Chest - clear to auscultation bilaterally anterriorly and posteriorly.  No wheezes.  No rales or rhonchi.  Breath sounds are symmetrical bilaterally.  Unlabored respirations.  Occasional dry cough.  Heart - normal rate.  Regular rhythm.  Normal S1, S2.  No murmur noted.  No rubs, clicks or gallops noted.  Abdomen - soft and nondistended.  No masses or organomegaly.  No rebound, rigidity or guarding.  Bowel sounds normal x 4 quadrants.  No tenderness noted.  Back exam - normal range of motion.  No pain on palpation of the spinous processes in the cervical, thoracic, lumbar, sacral regions.  No CVA tenderness.    Neurological - awake, alert and oriented to person, place, and time and event.  Cranial nerves II through XII intact.  Clear speech.  Muscle strength is +5/5 x 4 extremities.  Sensation is intact to light touch bilaterally.  Steady gait.   Musculoskeletal - Intact x 4 extremities.  Full ROM x 4 extremities.  No pain with movement.    Heme/Lymph -  peripheral pulses normal x 4 extremities.  No peripheral edema is noted.  No cervical adenopathy noted.  Skin - no rashes, erythema, ecchymosis, lacerations, abrasions, suspicious moles noted  Psychological -   normal behavior, dress and thought processes.  Good insight. Good eye contact.  Normal affect.  Appropriate mood.  Normal speech.    ASSESSMENT and PLAN    1. Acute bronchitis (466.0)  ALBUTEROL IPRATROP NON-COMP, INHAL RX, AIRWAY OBST/DX SPUTUM INDUCT, albuterol-ipratropium (DUO-NEB) 0.5 mg-3 mg(2.5 mg base)/3 mL nebulizer solution, albuterol (PROVENTIL HFA, VENTOLIN HFA) 90 mcg/Actuation inhaler, azithromycin (ZITHROMAX) 250 mg tablet    2. SOB (shortness of breath) (786.05T)  ALBUTEROL IPRATROP NON-COMP, INHAL RX, AIRWAY OBST/DX SPUTUM INDUCT   3. Insomnia (780.52A)  chlorpheniramine-hydrocodone (TUSSIONEX) 8-10 mg/5 mL suspension   4. Hematuria (599.43F)      likley due to kidney stones    5. Allergic rhinitis, cause unspecified (477.9)  mometasone (NASONEX) 50 mcg/Actuation nasal spray       cardiovascular risk and specific lipid/LDL goals reviewed  use of aspirin to prevent MI and TIA's discussed    Continue current medications and care.  Start ATB, if worse by the weekend.  Start Nasonex.  Use Albuterol prn breathing.  Prescriptions written and sent to pharmacy; medication side effects discussed  Prescription given to patient during office visit today.  Addressed weight, diet and exercise with patient  Counseled patient on health concerns:  congesion protocol  Relevant handouts given and discussed with patient  Immunizations noted; Advise Flu.  Offered empathy, support, legitamation, prayers, partnership to patient    Follow-up Disposition:  Return if symptoms worsen or fail to improve.

## 2010-07-13 NOTE — Patient Instructions (Signed)
Allergies: After Your Visit  Your Care Instructions  Allergies occur when your body???s defense system (immune system) overreacts to certain substances. The immune system treats a harmless substance as if it is a harmful germ or virus. Many things can cause this overreaction, including pollens, medicine, food, dust, animal dander, and mold.  Allergies can be mild or severe. Mild allergies can be managed with home treatment. But medicine may be needed to prevent problems.  Managing your allergies is an important part of staying healthy. Your doctor may suggest that you have allergy testing to help find out what is causing your allergies. When you know what things trigger your symptoms, you can avoid them. This can prevent allergy symptoms and other health problems.  For severe allergies that cause reactions that affect your whole body (anaphylactic reactions), your doctor may prescribe a shot of epinephrine (such as EpiPen) to carry with you in case you have a severe reaction. Learn how to give yourself the shot and keep it with you at all times. Make sure it is not expired.  Follow-up care is a key part of your treatment and safety. Be sure to make and go to all appointments, and call your doctor if you are having problems. It???s also a good idea to know your test results and keep a list of the medicines you take.  How can you care for yourself at home?  ?? If you have been told by your doctor that dust or dust mites are causing your allergy, decrease the dust around your bed:   ?? Wash sheets, pillowcases, and other bedding in hot water every week.   ?? Use dust-proof covers for pillows, duvets, and mattresses. Avoid plastic covers because they tear easily and do not ???breathe.??? Wash as instructed on the label.   ?? Do not use any blankets and pillows that you do not need.   ?? Use blankets that you can wash in your washing machine.   ?? Consider removing drapes and carpets, which attract and hold dust, from your bedroom.   ?? If you are allergic to house dust and mites, do not use home humidifiers. Your doctor can suggest ways you can control dust and mites.   ?? Look for signs of cockroaches. Cockroaches cause allergic reactions. Use cockroach baits to get rid of them. Then, clean your home well. Cockroaches like areas where grocery bags, newspapers, empty bottles, or cardboard boxes are stored. Do not keep these inside your home, and keep trash and food containers sealed. Seal off any spots where cockroaches might enter your home.   ?? If you are allergic to mold, get rid of furniture, rugs, and drapes that smell musty. Check for mold in the bathroom.   ?? If you are allergic to outdoor pollen or mold spores, use air-conditioning. Change or clean all filters every month. Keep windows closed.   ?? If you are allergic to pollen, stay inside when pollen counts are high. Use a vacuum cleaner with a HEPA filter or a double-thickness filter at least two times each week.   ?? Stay inside when air pollution is bad. Avoid paint fumes, perfumes, and other strong odors.   ?? Avoid conditions that make your allergies worse. Stay away from smoke. Do not smoke or let anyone else smoke in your house. Do not use fireplaces or wood-burning stoves.   ?? If you are allergic to your pets, change the air filter in your furnace every month. Use high-efficiency filters.   ??  If you are allergic to pet dander, keep pets outside or out of your bedroom. Old carpet and cloth furniture can hold a lot of animal dander. You may need to replace them.  When should you call for help?  Call 911 anytime you think you may need emergency care. For example, call if:  ?? You have severe trouble breathing.   ?? You passed out (lost consciousness).  Note: After calling 911, use the allergy kit prescribed by your doctor if you have one.  Watch closely for changes in your health, and be sure to contact your doctor if:  ?? You need help controlling your allergies.    ?? You have questions about allergy testing.   ?? You do not get better as expected.     Where can you learn more?   Go to [email or web address]  Enter W171 in the search box to learn more about "Allergies: After Your Visit."   ?? 2006-2011 [employer]. Care instructions adapted under license by [employer] (which disclaims liability or warranty for this information). This care instruction is for use with your licensed healthcare professional. If you have questions about a medical condition or this instruction, always ask your healthcare professional. [employer], [employer] disclaims any warranty or liability for your use of this information.  Content Version: 9.0.56994; Last Revised: April 6, 2010Bronchitis: After Your Visit  Your Care Instructions    Bronchitis is inflammation of the bronchial tubes, which carry air to the lungs. The tubes swell and produce mucus, or phlegm. The mucus and inflamed bronchial tubes make you cough. You may have trouble breathing.  Most cases of bronchitis are caused by viruses like those that cause colds. Antibiotics can help cure bronchitis that is caused by bacteria, but not bronchitis that is caused by a virus.  Bronchitis usually develops rapidly and lasts about 2 to 3 weeks in otherwise healthy people.  Follow-up care is a key part of your treatment and safety. Be sure to make and go to all appointments, and call your doctor if you are having problems. It???s also a good idea to know your test results and keep a list of the medicines you take.  How can you care for yourself at home?  ?? Take all medicines exactly as prescribed. If your doctor prescribes antibiotics, take them as directed. Do not stop taking them just because you feel better. You need to take the full course of antibiotics.   ?? Get some extra rest.    ?? Take an over-the-counter pain medicine, such as acetaminophen (Tylenol), ibuprofen (Advil, Motrin), or naproxen (Aleve) to reduce fever and relieve body aches. Read and follow all instructions on the label.   ?? Take an over-the-counter cough medicine that contains dextromethorphan to help quiet a dry, hacking cough so that you can sleep. Avoid cough medicines that have more than one active ingredient. Read and follow all instructions on the label.   ?? Breathe moist air from a humidifier, hot shower, or sink filled with hot water. The heat and moisture will thin mucus so you can cough it out.   ?? Do not smoke. Smoking can make bronchitis worse. If you need help quitting, talk to your doctor about stop-smoking programs and medicines. These can increase your chances of quitting for good.  When should you call for help?  Call 911 anytime you think you may need emergency care. For example, call if:  ?? You have severe trouble breathing.  Call your doctor now or  seek immediate medical care if:  ?? You have new or worsening shortness of breath.   ?? You cough up blood.   ?? You have new or increasing wheezing???a whistling sound when you breathe.   ?? You have a cough that brings up yellow or green mucus (sputum) from the lungs and occurs along with a fever.  Watch closely for changes in your health, and be sure to contact your doctor if:  ?? You are not getting better after 3 to 5 days.     Where can you learn more?   Go to [email or web address]  Enter H333 in the search box to learn more about "Bronchitis: After Your Visit."    ?? 2006-2011 [employer]. Care instructions adapted under license by [employer] (which disclaims liability or warranty for this information). This care instruction is for use with your licensed healthcare professional. If you have questions about a medical condition or this instruction, always ask your healthcare professional. [employer] disclaims any warranty or liability for your use of this information.  Content Version: 9.0.56994; Last Revised: April 1, 2011Rhinitis: After Your Visit  Your Care Instructions  Rhinitis is swelling and irritation of the inside of your nose. Allergies and infections often cause rhinitis. Common symptoms are a stuffy or runny nose and itchy, sore eyes, ears, throat, and mouth.  If allergies are causing your rhinitis, your doctor may do tests to find out what you are allergic to. You may be able to stop rhinitis symptoms if you avoid the things that cause problems. Your doctor may recommend or prescribe medicine to relieve your symptoms.  Follow-up care is a key part of your treatment and safety. Be sure to make and go to all appointments, and call your doctor if you are having problems. It???s also a good idea to know your test results and keep a list of the medicines you take.  How can you care for yourself at home?  ?? If your rhinitis is caused by allergies, try to identify what sets off (triggers) your symptoms. Steps you can take to avoid triggers include the following:   ?? Avoid yard work, which stirs up both pollen and mold.   ?? Do not smoke or allow others to smoke around you. If you need help quitting, talk to your doctor about stop-smoking programs and medicines. These can increase your chances of quitting for good.   ?? Do not use aerosol sprays, cleaning products, or perfumes.   ?? If pollen is one of your triggers, close your house and car windows during blooming season.   ?? Clean your house regularly to control dust and other irritants.    ?? Keep pets outside.  ?? If your doctor recommends over-the-counter medicines to relieve symptoms, take your medicines exactly as prescribed. Call your doctor if you think you are having a problem with your medicine.   ?? Use saline (saltwater) nasal washes to help keep your nasal passages open and wash out mucus and bacteria. You can buy saline nose drops at a grocery store or drugstore. Or you can make your own at home by mixing ?? teaspoon salt, 1 cup water (at room temperature), and ?? teaspoon baking soda. If you make your own, fill a bulb syringe with the solution, insert the tip into your nostril, and squeeze gently. Blow your nose.  When should you call for help?  Call your doctor now or seek immediate medical care if:  ?? You are having trouble breathing.  Watch closely for changes in your health, and be sure to contact your doctor if:  ?? Mucus from your nose gets thicker (like pus) or has new blood in it.   ?? You have new or worse symptoms.   ?? You do not get better as expected.     Where can you learn more?   Go to [email or web address]  Enter M030 in the search box to learn more about "Rhinitis: After Your Visit."   ?? 2006-2011 [employer]. Care instructions adapted under license by [employer] (which disclaims liability or warranty for this information). This care instruction is for use with your licensed healthcare professional. If you have questions about a medical condition or this instruction, always ask your healthcare professional. [employer], [employer] disclaims any warranty or liability for your use of this information.  Content Version: 9.0.56994; Last Revised: January 10, 2010      Advised protocol for clearing congestion:  Increase fluid intake, especially water to thin mucous and boost the immune system.  Avoid sugar and dairy while congested since they thicken mucous.  Get plenty of rest!  Gargle 3 times daily and as needed in Listerine or warm salt water vinegar solutions (1 tsp salt, 1 tsp vinegar in 1 cup lukewarm water.)  Use OTC nasal saline spray up each nostril twice daily.  Use humidifier at bedtime.  Use OTC Mucinex 600 mg twice daily to loosen mucous.    Use OTC Tylenol Arthritis or Ibuprofen up to 800 mg up to 3 times daily as needed for pain, fever or headaches.  Avoid decongestants and Ibuprofen if you have high blood pressure!  If mucous is consistently discolored yellow or green throughout the day for more than a week, call the doctor for an evaluation.    Rhinitis: After Your Visit  Your Care Instructions  Rhinitis is swelling and irritation of the inside of your nose. Allergies and infections often cause rhinitis. Common symptoms are a stuffy or runny nose and itchy, sore eyes, ears, throat, and mouth.  If allergies are causing your rhinitis, your doctor may do tests to find out what you are allergic to. You may be able to stop rhinitis symptoms if you avoid the things that cause problems. Your doctor may recommend or prescribe medicine to relieve your symptoms.  Follow-up care is a key part of your treatment and safety. Be sure to make and go to all appointments, and call your doctor if you are having problems. It???s also a good idea to know your test results and keep a list of the medicines you take.  How can you care for yourself at home?  ?? If your rhinitis is caused by allergies, try to identify what sets off (triggers) your symptoms. Steps you can take to avoid triggers include the following:   ?? Avoid yard work, which stirs up both pollen and mold.    ?? Do not smoke or allow others to smoke around you. If you need help quitting, talk to your doctor about stop-smoking programs and medicines. These can increase your chances of quitting for good.   ?? Do not use aerosol sprays, cleaning products, or perfumes.   ?? If pollen is one of your triggers, close your house and car windows during blooming season.   ?? Clean your house regularly to control dust and other irritants.   ?? Keep pets outside.  ?? If your doctor recommends over-the-counter medicines to relieve symptoms, take your medicines exactly as prescribed. Call your  doctor if you think you are having a problem with your medicine.   ?? Use saline (saltwater) nasal washes to help keep your nasal passages open and wash out mucus and bacteria. You can buy saline nose drops at a grocery store or drugstore. Or you can make your own at home by mixing ?? teaspoon salt, 1 cup water (at room temperature), and ?? teaspoon baking soda. If you make your own, fill a bulb syringe with the solution, insert the tip into your nostril, and squeeze gently. Blow your nose.  When should you call for help?  Call your doctor now or seek immediate medical care if:  ?? You are having trouble breathing.  Watch closely for changes in your health, and be sure to contact your doctor if:  ?? Mucus from your nose gets thicker (like pus) or has new blood in it.   ?? You have new or worse symptoms.   ?? You do not get better as expected.     Where can you learn more?   Go to [email or web address]  Enter M030 in the search box to learn more about "Rhinitis: After Your Visit."    ?? 2006-2011 [employer]. Care instructions adapted under license by [employer] (which disclaims liability or warranty for this information). This care instruction is for use with your licensed healthcare professional. If you have questions about a medical condition or this instruction, always ask your healthcare professional. [employer] disclaims any warranty or liability for your use of this information.  Content Version: 9.0.56994; Last Revised: January 10, 2010

## 2010-07-13 NOTE — Progress Notes (Signed)
Pt here for eval concerning possible bronchitis.   Her sx: cough, sore throat, sinus pressure, congestion, SOB. Painful sides from coughing.   Pt is depressed being sick for the first time without her husband.

## 2010-07-13 NOTE — Telephone Encounter (Addendum)
Message copied by Festus Aloe on Wed Jul 13, 2010 12:08 PM  ------   Letter mailed.      Message from: Lewayne Bunting D       Created: Wed Jul 13, 2010 11:05 AM         2 calls is likely adequate. Please send her a letter letting her know why we were trying to reach her.              ----- Message -----          From: Festus Aloe          Sent: 07/13/2010  10:35 AM            To: Myrna Blazer, MD              L/M x2              ----- Message -----          From: Myrna Blazer, MD          Sent: 07/11/2010            To: Festus Aloe              Please call to schedule office follow-up for after thyroid ultrasound.

## 2010-07-28 ENCOUNTER — Encounter

## 2010-08-22 NOTE — Patient Instructions (Signed)
Thyroid:    Nodules are stable in size    Recommend repeating ultrasound in 1 year.    Repeat labs today and recommend repeating this yearly.

## 2010-08-22 NOTE — Progress Notes (Addendum)
History of Present Illness: Kathryn [name] is a 74 y.o. female presents for follow-up of goiter.  She was seen one year ago for goiter. 2 right sided nodules had FNA biopsy performed and were benign.  She returns today after having repeat ultrasound done.   Her husband died from a long illness with liver cancer and she is grieving this still.  She will be moving to [place] to be with family  Her thyroid has not been bothering her and she denied dysphagia, discomfort or hoarseness. She thinks the goiter feels smaller.    Wt is down due to lack appetite and depression after death of husband. No heat intolerance, nervousness, tremulousness.    Past Medical History   Diagnosis Date   ??? Edema      left ankle   ??? DDD (degenerative disc disease) 10/1999     L 4-5 by MRI.  Dr. Shiela Mayer.  Dr. Robie Ridge, Pain   ??? Urinary incontinence, stress      mild.  Dr. Winnifred Friar   ??? Polyp of rectum      Dr. Reed Pandy   ??? Cystocele      Dr. Winnifred Friar   ??? Postmenopausal HRT (hormone replacement therapy)      Hx  for 6 years.  Dr. Lavone Nian   ??? Morton's neuroma 2000     Left.  Dr. Gillis Santa   ??? Hematuria of undiagnosed cause 1980, 2009   ??? Single renal cyst 02/12/2008     Left.  0.9 cm   ??? Subchondral cysts 2009     Left hip femoral heads.  Dr. Adah Perl   ??? DJD (degenerative joint disease) 05/26/04     Dr. Shiela Mayer.   ??? Hypercholesterolemia    ??? Hypertension    ??? Kidney stone 12/2002     Dr. Gillis Ends   ??? Chickenpox childhood   ??? DJD (degenerative joint disease) of hip 2009     Dr. Kelby Aline.     ??? Rotator cuff tendinitis 12/20/04     Right.  Dr. Jen Mow   ??? Benign positional vertigo 01/07/04     Dr. Minette Headland   ??? SOB (shortness of breath) 07/11/04     Dr. Abigail Miyamoto.  Neg Stress Cardiolite.  EF 60 %.   ??? Unspecified vitamin D deficiency 2008   ??? Herpes zoster 02/26/07, 06/21/07, 08/27/07     Left buttocks then Right T2-3 dermatome   ??? Herpes simplex type 2 infection 10/05/08      Left buttocks   ??? Cataract 2002     Dr. Theodoro Grist   ??? Multinodular goiter 10/2009     Dr. Lewayne Bunting.  benign.       Current outpatient prescriptions   Medication Sig   ??? alprazolam (XANAX) 0.5 mg tablet Take  by mouth nightly as needed.   ??? chlorpheniramine-hydrocodone (TUSSIONEX) 8-10 mg/5 mL suspension Take 5 mL by mouth every twelve (12) hours as needed for Cough and Congestion.   ??? albuterol (PROVENTIL HFA, VENTOLIN HFA) 90 mcg/Actuation inhaler Take 1 Puff by inhalation every six (6) hours as needed for Wheezing and Shortness of Breath.   ??? mometasone (NASONEX) 50 mcg/Actuation nasal spray 2 Sprays by Nasal route daily.   ??? alprazolam (XANAX) 0.5 mg tablet Take 1 Tab by mouth three (3) times daily as needed for Sleep and Anxiety.   ??? celecoxib (CELEBREX) 200 mg capsule Take 1 Cap by mouth two (2) times a  day.   ??? valsartan (DIOVAN) 160 mg tablet Take 1 Tab by mouth daily. For blood pressure   ??? valacyclovir (VALTREX) 500 mg tablet Take 1 Tab by mouth daily. For HSV   ??? pravastatin (PRAVACHOL) 80 mg tablet Take 1 Tab by mouth daily for 360 days. For cholesterol   ??? furosemide (LASIX) 20 mg tablet Take 1 Tab by mouth as needed. For edema   ??? raloxifene (EVISTA) 60 mg tablet Take 1 Tab by mouth daily. For bones   ??? ascorbic acid (VITAMIN C) 500 mg tablet Take 500 mg by mouth nightly.   ??? multivitamin (ONE A DAY) tablet Take 1 Tab by mouth daily.       Allergies   Allergen Reactions   ??? Penicillins Rash and Swelling   ??? Other Rash     All cillins   ??? Tetracycline Itching   ??? Zocor (Simvastatin) Other (comments)     Leg cramps   ??? Lipitor (Atorvastatin) Other (comments)     Leg cramps   ??? Niaspan (Niacin) Other (comments)     Caused elevated BP   ??? Crestor (Rosuvastatin) Other (comments)     Elevated CPK   ??? Bactrim (Sulfamethoprim Ds) Itching         Review of Systems:  - Eyes: no blurry vision or double vision  - Cardiovascular: no chest pain  - Respiratory: no shortness of breath  - Musculoskeletal: no myalgias     Physical Examination:  - Blood pressure 137/50, pulse 72, height 5\' 2"  (1.575 m), weight 159 lb (72.122 kg).  - General: pleasant, no distress, good eye contact   - Neck:  Mild enlargement of R gland  - Cardiovascular: regular, normal rate  - Respiratory: clear bilaterally  - Integumentary: no edema   Neuro: no tremor  - Psychiatric: normal mood and affect    Data Reviewed:   Examination: US THYROID PARATHYROID - [id number] - Aug 12 2010 2:02PM  Accession No: [id number]  Reason: thyroid nodules. Compare to 10/2009 study      REPORT:  Ultrasonography of the thyroid gland was performed and compared to the study   from 09/27/09. The right lobe measures 6.3 x 2.7 x 2.3 cm. The left lobe   measures 5.7 x 1.7 x 1.8 cm. The isthmus measures 5.1 mm. The thyroid is   diffusely heterogeneous in echotexture. Within the upper right lobe of the   thyroid, there is a 2.2 x 1.4 x 1.0 cm nodule. This measured 2.0 x 1.3 x 1.1   cm on the last study. Within the mid lobe, there is a 2.2 x 1.3 x 1.9 cm   nodule. This measured 2.7 x 1.5 x 1.7 cm on the last study. A second right   midpole nodule measures 1.9 x 1.4 x 1.7 cm. This measured 2.5 x 1.5 x 2.2 cm   on the last exam. In the left lobe, there is an 8.3 x 5.4 x 7.3 mm   hypoechoic nodule in the upper pole. This measured 7.4 x 4.9 x 7.0 mm on the   last study. In the lower pole, there is a 2.3 x 1.8 x 1.5 cm nodule. This   measured 2.6 x 1.7 x 1.0 cm on the last study.      IMPRESSION: No significant change in multinodular goiter.       Assessment/Plan:   1. Multiple thyroid nodules (241.1AE)   - FNA was reassuring last year and nodules are essentially unchanged in  size.    - Check TSH  - Discussed plan for repeat ultrasound in about a year. She could have this done in [place] and have local endocrinologist see her there.       Patient Instructions   Thyroid:    Nodules are stable in size    Recommend repeating ultrasound in 1 year.     Repeat labs today and recommend repeating this yearly.          Follow-up Disposition:  Return 1 year if in area.    Copy sent to:  [facility]     Addendum:  Component      Latest Ref Rng 08/22/2010 10/20/2009 02/26/2009          12:37 PM  3:03 PM 11:22 AM   TSH, 3rd generation      0.450 - 4.500 uIU/mL 0.847 0.943 1.00   TSH very stable. Letter sent

## 2010-08-23 LAB — TSH 3RD GENERATION: TSH: 0.847 u[IU]/mL (ref 0.450–4.500)

## 2010-08-25 MED ORDER — VALSARTAN 160 MG TAB
160 mg | ORAL_TABLET | ORAL | Status: DC
Start: 2010-08-25 — End: 2011-07-26

## 2010-08-30 NOTE — Telephone Encounter (Addendum)
Message copied by Festus Aloe on Tue Aug 30, 2010 11:41 AM  ------     Ms Lonna Cobb informed, letter mailed.      Message from: Myrna Blazer       Created: Tue Aug 30, 2010  6:16 AM         Please mail lab letter and call her to let her know her thyroid test was normal.

## 2010-09-05 MED ORDER — ALPRAZOLAM 0.5 MG TAB
0.5 mg | ORAL_TABLET | Freq: Three times a day (TID) | ORAL | Status: DC | PRN
Start: 2010-09-05 — End: 2011-02-21

## 2010-09-05 NOTE — Progress Notes (Signed)
HISTORY OF PRESENT ILLNESS  Kathryn [name] is a 75 y.o. female presents with Stress, Labs, Medication Refill and Referral Follow Up    Agree with nurse note.  SUBJECTIVE:  Kathryn [name] presents to our office for follow up on stress, labs, med refill and referral follow up.    She has been under a lot of stress since the death of her husband five months ago. She is planning to spend the holidays with her family in [place], [place], and [place] by April of next [place]  Since his death, she has noticed a decrease in her appetite but she over the past week, she has started drinking protein shakes.    She has lost a few pounds but thinks it is because she started participating in water aerobics due to her bilateral hip pain.  Dr. Philomena Doheny has been assisting her with the hip pain which has improved since she has been doing water aerobics.    She had labwork performed by our office and would like to discuss the results.    She saw Dr. Lewayne Bunting recently regarding a multinodular goiter.  He has said it is stable and to have an ultrasound repeated in a year.    She needs a refill of Xanax.  She does not use it every night nor does she use it three times a day.  But when she needs it, she needs it.  She just filled her last refill and would like a new prescription to take with her today.    She has had right low back pain in the kidney area over the past few days.  No known trauma.  She has a history of kidney stones and a history of blood in her urine.  She denies any other bladder symptoms today.    MedDATA/syh      ROS    Review of Systems negative except as noted above in HPI.    PHYSICAL EXAMINATION    Vital Signs  BP 110/70   Pulse 59   Temp(Src) 97.4 ??F (36.3 ??C) (Oral)   Ht 5' 2.5" (1.588 m)   Wt 153 lb 12.8 oz (69.763 kg)   BMI 27.68 kg/m2   SpO2 97%    General appearance - Well nourished. Well appearing.  Well developed.  No acute distress. Overweight.    Head - Normocephalic.  Atraumatic.    Eyes - pupils equal and reactive. Extraocular eye movements intact. Sclera anicteric.  Mildly injected sclera.  Ears - Hearing is grossly normal bilaterally.      Nose - normal and patent.  No polyps noted.  No erythema.  No discharge.    Mouth - mucous membranes with adequate moisture.  Posterior pharynx normal with cobblestone appearance.  No erythema, white exudate or obstruction.  Neck - supple.  Midline trachea.  No carotid bruits noted bilaterally.  No thyromegaly noted.  Chest - clear to auscultation bilaterally anterriorly and posteriorly.  No wheezes.  No rales or rhonchi.  Breath sounds are symmetrical bilaterally.  Unlabored respirations.  Heart - normal rate.  Regular rhythm.  Normal S1, S2.  No murmur noted.  No rubs, clicks or gallops noted.  Abdomen - soft and nondistended.  No masses or organomegaly.  No rebound, rigidity or guarding.  Bowel sounds normal x 4 quadrants.  No tenderness noted.  Back exam - normal range of motion.  No pain on palpation of the spinous processes in the cervical, thoracic,  lumbar, sacral regions.  Right CVA tenderness.    Neurological - awake, alert and oriented to person, place, and time and event.  Cranial nerves II through XII intact.  Clear speech.  Muscle strength is +5/5 x 4 extremities.  Sensation is intact to light touch bilaterally.  Steady gait. Negative Straight Leg Test bilaterally.    Musculoskeletal - Intact x 4 extremities.  Full ROM x 4 extremities.  No pain with movement.    Heme/Lymph - peripheral pulses normal x 4 extremities.  No peripheral edema is noted.  No cervical adenopathy noted.  Skin - no rashes, erythema, ecchymosis, lacerations, abrasions, suspicious moles noted  Psychological -   normal behavior, dress and thought processes.  Good insight. Good eye contact.  Normal affect.  Appropriate mood.  Normal speech.    ASSESSMENT and PLAN    1. Hematuria (599.7F)  URINALYSIS W/ RFLX MICROSCOPIC, CULTURE, URINE    2. Pure hyperglyceridemia (272.1)  LIPID PANEL   3. Low back pain (724.2A)      Right due to ?kidney stones vs other, intermittent   4. Adjustment disorder (309.9BR)      due to grief   5. Hip pain, bilateral (719.45T)      improving with water aerobics   6. Weight loss (783.21P)      likely due to increased physical activity vs decreased appetite vs protein shakes   7. Unspecified vitamin D deficiency (268.9)     8. Multinodular goiter (241.1R)      Right, unchanged   9. Adjustment disorder with anxiety (309.24)  ALPRAZolam (XANAX) 0.5 mg tablet   10. Insomnia (780.52A)  ALPRAZolam (XANAX) 0.5 mg tablet    due to stress       cardiovascular risk and specific lipid/LDL goals reviewed  use of aspirin to prevent MI and TIA's discussed    Controlled Substance Agreement reviewed and signed  Continue current medications and care  Prescription given to patient during office visit today.  Most recent tests reviewed  Recheck pertinent labs today  Recent office visit notes from endo reviewed.  Get ortho notes.  Addressed weight, diet and exercise with patient  Counseled patient on health concerns:  Stressors.  Weight.  Relevant handouts given and discussed with patient  Immunizations noted; Advise Flu.  Offered empathy, support, legitamation, prayers, partnership to patient  Praised patient for progress  Follow-up Disposition:  Return in about 6 months (around 03/06/2011) for stress, med check.  More than 30 mins spent with patient with more than 50% of this time spent in counseling or coordinating care

## 2010-09-05 NOTE — Progress Notes (Signed)
Pt here for 3 mo f/u concerning stress.

## 2010-09-05 NOTE — Patient Instructions (Signed)
Back Pain, Emergency or Urgent Symptoms: After Your Visit  Your Care Instructions  Many people have back pain at one time or another. In most cases, pain gets better with self-care that includes over-the-counter pain medicine, ice, heat, and exercises.  Unless you have symptoms of a severe injury or heart attack, you may be able to give yourself a few days before you call a doctor. But some back problems are very serious. Do not ignore symptoms that need to be checked right away. Call your doctor if:  ?? You have pain with numbness or bladder or bowel problems.   ?? You have pain, weakness, or numbness in one or both legs or you have trouble walking.   ?? You have a fever with back pain that has just started.  Follow-up care is a key part of your treatment and safety. Be sure to make and go to all appointments, and call your doctor if you are having problems. It???s also a good idea to know your test results and keep a list of the medicines you take.  How can you care for yourself at home?  ?? Sit or lie in positions that are most comfortable and that reduce your pain. Try one of these positions when you lie down:   ?? Lie on your back with your knees bent and supported by large pillows.   ?? Lie on the floor with your legs on the seat of a sofa or chair.   ?? Lie on your side with your knees and hips bent and a pillow between your legs.   ?? Lie on your stomach if it does not make pain worse.  ?? Do not sit up in bed, and avoid soft couches and twisted positions. Bed rest can help relieve pain at first, but it delays healing. Avoid bed rest after the first day.   ?? Change positions every 30 minutes. If you must sit for long periods of time, take breaks from sitting. Get up and walk around, or lie flat.    ?? Try using a heating pad on a low or medium setting, for 15 to 20 minutes every 2 or 3 hours. Try a warm shower in place of one session with the heating pad. You can also buy single-use heat wraps that last up to 8 hours. You can also try ice or cold packs on your back for 10 to 20 minutes at a time, several times a day. (Put a thin cloth between the ice pack and your skin.) This reduces pain and makes it easier to be active and exercise.   ?? Take pain medicines exactly as directed.   ?? If the doctor gave you a prescription medicine for pain, take it as prescribed.   ?? If you are not taking a prescription pain medicine, ask your doctor if you can take an over-the-counter medicine.   ?? Do not take two or more pain medicines at the same time unless the doctor told you to. Many pain medicines have acetaminophen, which is Tylenol. Too much acetaminophen (Tylenol) can be harmful.  When should you call for help?  Call 911 anytime you think you may need emergency care. For example, call if:  ?? You have symptoms of a heart attack, such as:   ?? Chest pain or pressure.   ?? Sweating.   ?? Shortness of breath.   ?? Nausea or vomiting.   ?? Pain that spreads from the chest to the back, neck, or jaw,  or one or both shoulders or arms.   ?? Dizziness or lightheadedness.   ?? A fast or uneven pulse.  After calling 911, chew 1 adult-strength aspirin. Wait for an ambulance. Do not try to drive yourself.   ?? You have signs of a severe spinal injury. These may include:   ?? Loss of bowel or bladder control (not being able to hold your urine or bowel movements).   ?? Weakness in the legs.   ?? Numbness or tingling in the rectal area, genital area, or legs.  Call your doctor now or seek immediate medical care if:  ?? You have severe back pain that is not from any known injury.   ?? You have severe back pain at night that will not go away.   ?? You are not able to urinate.   ?? You have back pain and:    ?? You have injured your back while lifting or doing some other activity. If the pain is severe, has not gone away after 1 or 2 days, and you cannot do your normal daily activities, call your doctor.   ?? You have had a back injury before that needed treatment.   ?? Your pain has lasted longer than 4 weeks.   ?? You have had weight loss you cannot explain.   ?? You are age 72 or older.   ?? You have cancer now or have had it before.  Watch closely for changes in your health, and be sure to contact your doctor if:  ?? You do not get better as expected.    Where can you learn more?   Go to [facility]  Enter [phone] in the search box to learn more about "Back Pain, Emergency or Urgent Symptoms: After Your Visit."   ?? 2006-2011 [employer]. Care instructions adapted under license by [employer] (which disclaims liability or warranty for this information). This care instruction is for use with your licensed healthcare professional. If you have questions about a medical condition or this instruction, always ask your healthcare professional. [employer] disclaims any warranty or liability for your use of this information.  Content Version: 9.0.56994; Last Revised: March 22, 2010Acute Low Back Pain: Exercises  Your Care Instructions  Here are some examples of typical rehabilitation exercises for your condition. Start each exercise slowly. Ease off the exercise if you start to have pain.  Your doctor or physical therapist will tell you when you can start these exercises and which ones will work best for you.  When you are not being active, find a comfortable position for rest. Some people are comfortable on the floor or a medium-firm bed with a small pillow under their head and another under their knees. Some people prefer to lie on their side with a pillow between their knees. Don't stay in one position for too long.   Take short walks (10 to 20 minutes) every 2 to 3 hours. Avoid slopes, hills, and stairs until you feel better. Walk only distances you can manage without pain, especially leg pain.  How to do the exercises  Back stretches     1. Get down on your hands and knees on the floor.   2. Relax your head and allow it to droop. Round your back up toward the ceiling until you feel a nice stretch in your upper, middle, and lower back. Hold this stretch for as long as it feels comfortable, or about 15 to 30 seconds.   3. Return  to the starting position with a flat back while you are on your hands and knees.   4. Let your back sway by pressing your stomach toward the floor. Lift your buttocks toward the ceiling.   5. Hold this position for 15 to 30 seconds.   6. Repeat 2 to 4 times.  Follow-up care is a key part of your treatment and safety. Be sure to make and go to all appointments, and call your doctor if you are having problems. It's also a good idea to know your test results and keep a list of the medicines you take.    Where can you learn more?   Go to [facility]  Enter Z071 in the search box to learn more about "Acute Low Back Pain: Exercises."   ?? 2006-2011 [employer]. Care instructions adapted under license by [employer] (which disclaims liability or warranty for this information). This care instruction is for use with your licensed healthcare professional. If you have questions about a medical condition or this instruction, always ask your healthcare professional. [employer] disclaims any warranty or liability for your use of this information.  Content Version: 9.0.56994; Last Revised: February 10, 2010Back Stretches: Exercises  Your Care Instructions  Here are some examples of exercises for stretching your back. Start each exercise slowly. Ease off the exercise if you start to have pain.   Your doctor or physical therapist will tell you when you can start these exercises and which ones will work best for you.  How to do the exercises  Overhead stretch     1. Stand comfortably with your feet shoulder-width apart.   2. Looking straight ahead, raise both arms over your head and reach toward the ceiling. Do not allow your head to tilt back.   3. Hold for 15 to 30 seconds, then lower your arms to your sides.   4. Repeat 2 to 4 times.  Side stretch     1. Stand comfortably with your feet shoulder-width apart.   2. Raise one arm over your head, and then lean to the other side.   3. Slide your hand down your leg as you let the weight of your arm gently stretch your side muscles. Hold for 15 to 30 seconds.   4. Repeat 2 to 4 times on each side.  Press-up     1. Lie on your stomach, supporting your body with your forearms.   2. Press your elbows down into the floor to raise your upper back. As you do this, relax your stomach muscles and allow your back to arch without using your back muscles. As your press up, do not let your hips or pelvis come off the floor.   3. Hold for 15 to 30 seconds, then relax.   4. Repeat 2 to 4 times.  Relax and rest     1. Lie on your back with a rolled towel under your neck and a pillow under your knees. Extend your arms comfortably to your sides.   2. Relax and breathe normally.   3. Remain in this position for about 10 minutes.   4. If you can, do this 2 or 3 times each day.  Follow-up care is a key part of your treatment and safety. Be sure to make and go to all appointments, and call your doctor if you are having problems. It's also a good idea to know your test results and keep a list of the medicines you take.  Where can you learn more?   Go to [facility]  Enter Y090 in the search box to learn more about "Back Stretches: Exercises."    ?? 2006-2011 [employer]. Care instructions adapted under license by [employer] (which disclaims liability or warranty for this information). This care instruction is for use with your licensed healthcare professional. If you have questions about a medical condition or this instruction, always ask your healthcare professional. [employer] disclaims any warranty or liability for your use of this information.  Content Version: 9.0.56994; Last Revised: September 04, 2008    Avoid lifting, pushing, pulling more than 5-10 pounds.  Avoid prolonged sitting, standing, bending, reaching.  Alternate ice with moist heat to affected areas.  Alternate Ibuprofen up to 800 mg with food up to 3 times daily with OTC Tylenol Arthritis every 4-6 hours as needed for pain.   Do exercises as described in relevant handouts given.

## 2010-09-07 LAB — CULTURE, URINE

## 2010-09-07 LAB — URINALYSIS W/ RFLX MICROSCOPIC
Bilirubin: NEGATIVE
Glucose: NEGATIVE
Ketone: NEGATIVE
Leukocyte Esterase: NEGATIVE
Nitrites: NEGATIVE
Protein: NEGATIVE
Specific Gravity: 1.027 (ref 1.005–1.030)
Urobilinogen: 0.2 mg/dL (ref 0.0–1.9)
pH (UA): 6 (ref 5.0–7.5)

## 2010-09-07 LAB — LIPID PANEL
Cholesterol, total: 167 mg/dL (ref 100–199)
HDL Cholesterol: 46 mg/dL (ref >39)
LDL, calculated: 96 mg/dL (ref 0–99)
Triglyceride: 127 mg/dL (ref 0–149)
VLDL, calculated: 25 mg/dL (ref 5–40)

## 2010-09-07 LAB — MICROSCOPIC EXAMINATION

## 2010-10-17 LAB — HM PAP SMEAR

## 2010-10-17 MED ORDER — FUROSEMIDE 20 MG TAB
20 mg | ORAL_TABLET | ORAL | Status: DC
Start: 2010-10-17 — End: 2011-02-21

## 2010-11-28 NOTE — Progress Notes (Signed)
Quick Note:    SEE RESULT LETTER.    ______

## 2011-02-21 LAB — AMB POC GLUCOSE BLOOD, BY GLUCOSE MONITORING DEVICE: Glucose POC: 101

## 2011-02-21 LAB — AMB POC HEMOGLOBIN A1C: Hemoglobin A1c (POC): 5.8

## 2011-02-21 MED ORDER — FUROSEMIDE 20 MG TAB
20 mg | ORAL_TABLET | ORAL | Status: DC
Start: 2011-02-21 — End: 2011-06-17

## 2011-02-21 MED ORDER — ALPRAZOLAM 0.5 MG TAB
0.5 mg | ORAL_TABLET | Freq: Three times a day (TID) | ORAL | Status: DC | PRN
Start: 2011-02-21 — End: 2011-05-25

## 2011-02-21 NOTE — Progress Notes (Signed)
HISTORY OF PRESENT ILLNESS  Kathryn [name] is a 75 y.o. female presents with Anxiety, Hypertension, Medication Refill and Labs    Agree with nurse note.  Subjective:  Kathryn [name] presents to our office for blood pressure check, medication refill, anxiety and labs.      She has a history of high blood pressure, high cholesterol, low vitamin D and blood in her urine.  She is arriving upon the anniversary of the death or her husband.  She says she has had a difficult year because she says, "We were one."  She has a good support system in place and has decided to hold off moving to [place] until she hears from the [place] and hopefully by August.  She has had some difficulty sleeping and has been more tearful now that the one year anniversary is approaching.  She had stopped using Xanax and says that she needs it right now.  She is requesting refills of 0.5 mg.    She takes Pravachol 80 mg daily for cholesterol and Diovan 160 mg for blood pressure.  She had lab work performed by our office last year and would like to discuss the results.      She has seen Dr. Lewayne Bunting for the thyroid goiter.  She will followup with him regarding this.  She denies any headaches, dizziness, chest pain, chest tightness, shortness of breath, cough, wheeze, sneeze or new swelling anywhere.    She wonders if her sugar is still high.  She admits that she drinks about three cups of sugary beverages daily.  She denies any increased hunger or increased thirst.      She was given antibiotic by her gynecologist to see if blood in her urine would clear.    MedDATA/leh     Pt exercises with [employer]obo.    ROS    Review of Systems negative except as noted above in HPI.    PHYSICAL EXAMINATION    Vital Signs  BP 119/66   Pulse 65   Temp 96.7 ??F (35.9 ??C)   Ht 5' 2.5" (1.588 m)   Wt 155 lb (70.308 kg)   BMI 27.90 kg/m2   SpO2 98%    General appearance - Well nourished. Well appearing.  Well developed.  No acute distress. Overweight.    Head - Normocephalic.  Atraumatic.    Eyes - pupils equal and reactive. Extraocular eye movements intact. Sclera anicteric.  Mildly injected sclera.  Ears - Hearing is grossly normal bilaterally.      Nose - normal and patent.  No polyps noted.  No erythema.  No discharge.    Mouth - mucous membranes with adequate moisture.  Posterior pharynx normal with cobblestone appearance.  No erythema, white exudate or obstruction.  Neck - supple.  Midline trachea.  No carotid bruits noted bilaterally.  No thyromegaly noted.  Chest - clear to auscultation bilaterally anterriorly and posteriorly.  No wheezes.  No rales or rhonchi.  Breath sounds are symmetrical bilaterally.  Unlabored respirations.  Heart - normal rate.  Regular rhythm.  Normal S1, S2.  No murmur noted.  No rubs, clicks or gallops noted.  Abdomen - soft and nondistended.  No masses or organomegaly.  No rebound, rigidity or guarding.  Bowel sounds normal x 4 quadrants.  No tenderness noted.  Neurological - awake, alert and oriented to person, place, and time and event.  Cranial nerves II through XII intact.  Clear speech.  Muscle strength is +5/5 x  4 extremities.  Sensation is intact to light touch bilaterally.  Steady gait.   Musculoskeletal - Intact x 4 extremities.  Full ROM x 4 extremities.  No pain with movement.    Heme/Lymph - peripheral pulses normal x 4 extremities.  No peripheral edema is noted.  No cervical adenopathy noted.  Psychological -   normal behavior, dress and thought processes.  Good insight. Good eye contact.  Normal affect.  Appropriate mood.  Normal speech.    ASSESSMENT and PLAN    1. Adjustment disorder with anxiety  ALPRAZolam (XANAX) 0.5 mg tablet   2. Increased thirst  AMB POC GLUCOSE BLOOD, BY GLUCOSE MONITORING DEVICE, AMB POC HEMOGLOBIN A1C    possibly due to coffee vs other   3. Weight loss      due to decreased appetite and effort   4. Edema  furosemide (LASIX) 20 mg tablet   5. Insomnia  ALPRAZolam (XANAX) 0.5 mg tablet     due to stress   6. Need for pneumococcal vaccination     7. Hematuria of undiagnosed cause     8. Unspecified vitamin D deficiency      stable   9. Multinodular goiter      stable     cardiovascular risk and specific lipid/LDL goals reviewed  use of aspirin to prevent MI and TIA's discussed    Controlled Substance Agreement reviewed and signed  Continue current medications and care.  Since pt has lost weight, decrease Zocor to 40 mg daily.  Prescription given to patient during office visit today.  Most recent tests reviewed  Recheck pertinent labs today and lipids in 3 months.  Recent office visit notes from endo reviewed  Addressed weight, diet and exercise with patient  Counseled patient on health concerns:  Stressors.  Grief.  Relevant handouts given and discussed with patient  Immunizations noted; Advise pneumovax.  Offered empathy, support, legitamation, prayers, partnership to patient  Praised patient for progress  Follow-up Disposition:  Return in about 3 months (around 05/24/2011) for results, weight, med check, cholesterol.  More than 30 mins spent with patient with more than 50% of this time spent in counseling or coordinating care

## 2011-02-21 NOTE — Patient Instructions (Signed)
Pneumococcal Polysaccharide  Pneumococcal Polysaccharide Vaccine: What You Need to Know   Many Vaccine Information Statements are available in Spanish and other languages. See www.immunize.org/vis.   1. Why get vaccinated?   Pneumococcal disease is a serious disease that causes much sickness and death. In fact, Pneumococcal disease kills more people in the [place] each year than all other vaccine-preventable diseases combined. Anyone can get pneumococcal disease. However, some   people are at greater risk from the disease. These include people 65 and older, the very young, and people with special health problems such as alcoholism, heart or lung disease, kidney failure, diabetes, HIV infection, or certain types of cancer.   Pneumococcal disease can lead to serious infections of the lungs (pneumonia), the blood bacteremia), and the covering of the brain (meningitis). About 1 out of every 20 people who get pneumococcal pneumonia dies from it, as do about 2 people out of 10 who get bacteremia and 3 people out of 10 who get meningitis. People with the special health problems mentioned above are even more likely to die from the disease. rDrugs such as penicillin were once effective in treating, these infections; but the disease has become more resistant to these drugs, making treatment of pneumococcal infections more difficult. This makes prevention of the disease through vaccination even more important.   2. Pneumococcal polysaccharide vaccine (PPV)   The pneumococcal polysaccharide vaccine (PPV) protects against 23 types of pneumococcal bacteria. Most healthy adults who get the vaccine develop protection to most or all of these types within 2 to 3 weeks of getting the shot. Very old people, children under 2 years of age, and people with some long-term illnesses might not respond as well or at all.   3. Who should get PPV?   ??? All adults 65 years of age or older.    ??? Anyone over 2 years of age who has a long-term health problem such as:   - heart disease   - lung disease   - sickle cell disease   - diabetes   - alcoholism   - cirrhosis   - leaks of cerebrospinal fluid   ??? Anyone over 2 years of age who has a disease or condition that lowers the body's resistance to infection, such as:   - lymphoma, leukemia   - Hodgkin's disease   - kidney failure   - nephrotic syndrome   - damaged spleen, or no spleen   - organ transplant   - multiple myeloma   - HIV infection or AIDS   ??? Anyone over 2 years of age who is taking any drug or treatment that lowers the body's resistance to infection, such as:   - long-term steroids   - radiation therapy   - certain cancer drugs   ??? [place]atives and certain Native American populations.   4. How many doses of PPV are needed?   Usually one dose of PPV is all that is needed.   However, under some circumstances a second dose may be given.   ??? A second dose is recommended for those people aged 65 and older who got their first dose when they were under 65, if 5 or more years have passed since that dose.   ??? A second dose is also recommended for people who:   - have a damaged spleen or no spleen   - have sickle-cell disease   - have HIV infection or AIDS   - have cancer, leukemia, lymphoma, multiple   myeloma   - have kidney failure   - have nephrotic syndrome   - have had an organ or bone marrow transplant   - are taking medication that lowers immunity (such as chemotherapy or long-term steroids)   Children 10 years old and younger may get this second dose 3 years after the first dose. Those older than 10 should get it 5 years after the first dose.   5. Other facts about getting the vaccine   * Otherwise healthy children who often get ear infections, sinus infections, or other upper respiratory diseases do not need to get PPV because of these conditions.    * PPV may be less effective in some people, especially those with lower resistance to infection. But these people should still be vaccinated, because they are more likely to get seriously ill from Pneumococcal disease.   * Pregnancy: The safety of PPV for pregnant women has not yet been studied. There is no evidence that the vaccine is harmful to either the mother or the fetus, but pregnant women should consult with their doctor before being vaccinated. Women who are at high risk of pneumococcal disease should be vaccinated before becoming pregnant, if possible.   6. What are the risks from PPV?   PPV is a very safe vaccine.   About half of those who get the vaccine have very mild side effects, such as redness or pain where the shot is given.   Less than 1% develop a fever, muscle aches, or more severe local reactions.   Severe allergic reactions have been reported very rarely.   As with any medicine, there is a very small risk that serious problems, even death, could occur after getting a vaccine.   Getting the disease is much more likely to cause serious problems than getting the vaccine.   7. What if there is a moderate or severe reaction?   What should I look for?   Any unusual conditions, such as a serious allergic reaction, high fever or unusual behavior. Serious allergic reactions are extremely rare with any vaccine. If one were to occur, it would most likely be within a few minutes to a few hours after the shot. Signs can include difficulty breathing, hoarseness or wheezing, hives, paleness, weakness, a fast heart beat or dizziness. If a high fever or seizure were to occur, it would usually be within a week after the shot.   What should I do?   Call a doctor or get the person to a doctor right away.   Tell your doctor what happened, the date and time it happened, and when the vaccination was given.    Ask your doctor, nurse, or health department to file a Vaccine Adverse Event Reporting System (VAERS) form. Or you can file this report through the VAERS web site at www.vaers.hhs.gov, or by calling 1-800-822-7967.   VAERS does not provide medical advice.   8. How can I learn more?   Ask your health care provider. They can give you the vaccine package insert or suggest other sources of information.   Call your local or state health department's immunization program.   Contact the Centers for Disease Control and Prevention (CDC):   - Call 1-800-232-4636 (1-800-CDC-INFO)   - Visit the CDC???s website at www.cdc.gov/vaccines   U.S. Department of Health & Human Services   Centers for Disease Control and Prevention   Vaccine Information Statement   Pneumococcal   05/13/96

## 2011-02-27 NOTE — Progress Notes (Signed)
Quick Note:    Discussed with patient at ov.    ______

## 2011-03-10 MED ORDER — SIMVASTATIN 40 MG TAB
40 mg | ORAL_TABLET | Freq: Every evening | ORAL | Status: DC
Start: 2011-03-10 — End: 2011-05-25

## 2011-03-10 NOTE — Telephone Encounter (Signed)
Message            Per last OV note, decrease Zocor to 40 mg daily. Pt states did not receive a printed Rx at last OV.

## 2011-03-15 MED ORDER — EVISTA 60 MG TABLET
60 mg | ORAL_TABLET | ORAL | Status: DC
Start: 2011-03-15 — End: 2012-03-04

## 2011-05-04 ENCOUNTER — Encounter

## 2011-05-04 MED ORDER — CELECOXIB 200 MG CAP
200 mg | ORAL_CAPSULE | Freq: Two times a day (BID) | ORAL | Status: DC
Start: 2011-05-04 — End: 2011-09-13

## 2011-05-04 NOTE — Telephone Encounter (Signed)
faxed

## 2011-05-25 MED ORDER — VALACYCLOVIR 500 MG TAB
500 mg | ORAL_TABLET | Freq: Every day | ORAL | Status: DC
Start: 2011-05-25 — End: 2012-05-08

## 2011-05-25 MED ORDER — PRAVASTATIN 40 MG TAB
40 mg | ORAL_TABLET | Freq: Every evening | ORAL | Status: DC
Start: 2011-05-25 — End: 2011-08-25

## 2011-05-25 NOTE — Progress Notes (Signed)
HISTORY OF PRESENT ILLNESS  Kathryn [name] is a 75 y.o. female presents with Hypertension, Results, Immunization/Injection, Stress and Medication Refill    Agree with nurse note.  Subjective:  Kathryn [name] presents to our office for blood pressure check, lab results, immunization, stress followup and medication refills.    She is a hyperlipidemic patient with a history of low vitamin D, HSV and other health concerns.  During her last office visit, she was experiencing left ankle swelling, difficulty sleeping, increased thirst and no longer has those symptoms.  She has made significant diet changes and is participating in the exercise program at [facility].  She participates in [place] and aerobics three days a week and even water aerobics on Sundays.  She feels so much better.    This has also helped her grief in dealing with the death of her husband, which has now been a year ago.  She finds that she is less tearful and is now starting to go out and have lunch dates with friends.  She has not used Xanax in quite some time because she did not want to become addicted and finds that she does not need it now.  She still has about a half a bottle and does not need that refilled today.    She also has a history of high blood pressure but denies any headaches, dizziness, chest pain, chest tightness, shortness of breath, cough, wheeze, sneeze or new swelling anywhere.  She is tolerating Diovan 160 mg daily well.      She needs a refill of her acyclovir.  She will be running out soon and she uses it everyday.  She has had no outbreaks since she started using it.    No other complaints today.  She feels great.    MedDATA/leh        ROS    Review of Systems negative except as noted above in HPI.    ALLERGIES:    Allergies   Allergen Reactions   ??? Other Rash     All cillins   ??? Penicillins Rash and Swelling   ??? Tetracycline Itching   ??? Bactrim (Sulfamethoprim Ds) Itching    ??? Crestor (Rosuvastatin) Other (comments)     Elevated CPK   ??? Lipitor (Atorvastatin) Other (comments)     Leg cramps   ??? Niaspan (Niacin) Other (comments)     Caused elevated BP   ??? Zocor (Simvastatin) Other (comments)     Leg cramps       CURRENT MEDICATIONS:  Current outpatient prescriptions:calcium-cholecalciferol, d3, (CALCIUM 600 + D) 600-125 mg-unit Tab, Take  by mouth.  , Disp: , Rfl: ;  calcium 500 mg Tab, Take  by mouth.  , Disp: , Rfl: ;  magnesium 250 mg Tab, Take  by mouth. Unsure of exact strength , Disp: , Rfl: ;  pravastatin (PRAVACHOL) 40 mg tablet, Take 1 Tab by mouth nightly. Indications: HYPERCHOLESTEROLEMIA, Disp: 90 Tab, Rfl: 3  valACYclovir (VALTREX) 500 mg tablet, Take 1 Tab by mouth daily. For HSV, Disp: 90 Tab, Rfl: 3;  celecoxib (CELEBREX) 200 mg capsule, Take 1 Cap by mouth two (2) times a day., Disp: 60 Cap, Rfl: 3;  EVISTA 60 mg tablet, TAKE 1 TABLET EVERY DAY FOR BONES, Disp: 90 Tab, Rfl: 3;  b complex vitamins tablet, Take 1 Tab by mouth daily.  , Disp: , Rfl: ;  furosemide (LASIX) 20 mg tablet, 1 daily as needed, Disp: 90 Tab, Rfl: 0  valsartan (DIOVAN) 160 mg tablet, TAKE 1 TABLET BY MOUTH EVERY DAY FOR BLOOD PRESSURE, Disp: 90 Tab, Rfl: 2;  ascorbic acid (VITAMIN C) 500 mg tablet, Take 500 mg by mouth nightly., Disp: , Rfl: ;  multivitamin (ONE A DAY) tablet, Take 1 Tab by mouth daily., Disp: , Rfl:     PAST MEDICAL HISTORY:    Past Medical History   Diagnosis Date   ??? Edema      left ankle   ??? DDD (degenerative disc disease) 10/1999     L 4-5 by MRI.  Dr. Shiela Mayer.  Dr. Robie Ridge, Pain   ??? Urinary incontinence, stress      mild.  Dr. Winnifred Friar   ??? Polyp of rectum      Dr. Reed Pandy   ??? Cystocele      Dr. Winnifred Friar   ??? Postmenopausal HRT (hormone replacement therapy)      Hx  for 6 years.  Dr. Lavone Nian   ??? Morton's neuroma 2000     Left.  Dr. Gillis Santa   ??? Hematuria of undiagnosed cause 1980, 2009   ??? Single renal cyst 02/12/2008     Left.  0.9 cm    ??? Subchondral cysts 2009     Left hip femoral heads.  Dr. Adah Perl   ??? DJD (degenerative joint disease) 05/26/04     Dr. Shiela Mayer.   ??? Hypercholesterolemia    ??? Hypertension    ??? Kidney stone 12/2002     Dr. Gillis Ends   ??? Chickenpox childhood   ??? DJD (degenerative joint disease) of hip 2009     Dr. Kelby Aline.     ??? Rotator cuff tendinitis 12/20/04     Right.  Dr. Jen Mow   ??? Benign positional vertigo 01/07/04     Dr. Minette Headland   ??? SOB (shortness of breath) 07/11/04     Dr. Abigail Miyamoto.  Neg Stress Cardiolite.  EF 60 %.   ??? Unspecified vitamin D deficiency 2008   ??? Herpes zoster 02/26/07, 06/21/07, 08/27/07     Left buttocks then Right T2-3 dermatome   ??? Herpes simplex type 2 infection 10/05/08     Left buttocks   ??? Cataract 2002     Dr. Theodoro Grist   ??? Multinodular goiter 10/2009     Dr. Lewayne Bunting.  benign.       PAST SURGICAL HISTORY:    Past Surgical History   Procedure Date   ??? Hx cyst removal 1970s     Rt breast then Left.  benign.   ??? Hx bunionectomy 1970s, 2003     Bilateral.  Dr. Stephannie Li.   ??? Hx hysterectomy 1988     with BSO   ??? Pr foot/toes surgery proc unlisted 2000     Left Morton's Neuroma.  Dr. Stephannie Li   ??? Hx hip replacement 12/21/08 Right, 06/14/09 Left     Dr. Birdena Crandall      ??? Hx breast biopsy 1970's     bil.   ??? Hx cataract removal 2002, 2005     bilateral.  Dr. Theodoro Grist.   ??? Hx tonsillectomy    ??? Hx appendectomy    ??? Ir fine needle aspiration thyroid 10/2009     Right upper pole.  due to multinodular goiter.  Dr. Lewayne Bunting.  benign.       FAMILY HISTORY:    Family History   Problem Relation Age of Onset   ??? Diabetes  Mother    ??? Heart Attack Mother 56   ??? Cancer Father      pancreatic   ??? Cancer Brother      bile duct cancer   ??? Heart Attack Sister 64   ??? Arthritis-osteo Sister    ??? Asthma Sister        SOCIAL HISTORY:    History     Social History   ??? Marital Status: Married     Spouse Name: N/A     Number of Children: N/A   ??? Years of Education: N/A      Social History Main Topics   ??? Smoking status: Former Smoker -- 0.5 packs/day for 10 years     Types: Cigarettes     Quit date: 10/17/1987   ??? Smokeless tobacco: Never Used   ??? Alcohol Use: No   ??? Drug Use: No   ??? Sexually Active: Yes -- Female partner(s)     Birth Control/ Protection: None     Other Topics Concern   ??? Not on file     Social History Narrative   ??? No narrative on file       IMMUNIZATIONS:    Immunization History   Administered Date(s) Administered   ??? H1N1 Influenza Virus Vaccine 09/15/2008   ??? Influenza Vaccine Split 08/26/2009   ??? Influenza Vaccine Whole 06/16/2008   ??? Pneumococcal Vaccine 10/16/1998, 02/21/2011   ??? TD Vaccine 01/06/2003         PHYSICAL EXAMINATION    Vital Signs  BP 122/72   Pulse 69   Temp 97.8 ??F (36.6 ??C)   Ht 5' 2.5" (1.588 m)   Wt 157 lb 4.8 oz (71.351 kg)   BMI 28.31 kg/m2   SpO2 99%    General appearance - Well nourished. Well appearing.  Well developed.  No acute distress. Overweight.   Head - Normocephalic.  Atraumatic.    Eyes - pupils equal and reactive. Extraocular eye movements intact. Sclera anicteric.  Mildly injected sclera.  Ears - Hearing is grossly normal bilaterally.      Nose - normal and patent.  No polyps noted.  No erythema.  No discharge.    Mouth - mucous membranes with adequate moisture.  Posterior pharynx normal with cobblestone appearance.  No erythema, white exudate or obstruction.  Neck - supple.  Midline trachea.  No carotid bruits noted bilaterally.  No thyromegaly noted.  Chest - clear to auscultation bilaterally anterriorly and posteriorly.  No wheezes.  No rales or rhonchi.  Breath sounds are symmetrical bilaterally.  Unlabored respirations.  Heart - normal rate.  Regular rhythm.  Normal S1, S2.  No murmur noted.  No rubs, clicks or gallops noted.  Abdomen - soft and nondistended.  No masses or organomegaly.  No rebound, rigidity or guarding.  Bowel sounds normal x 4 quadrants.  No tenderness noted.   Neurological - awake, alert and oriented to person, place, and time and event.  Cranial nerves II through XII intact.  Clear speech.  Muscle strength is +5/5 x 4 extremities.  Sensation is intact to light touch bilaterally.  Steady gait.   Heme/Lymph - peripheral pulses normal x 4 extremities.  No peripheral edema is noted.    Musculoskeletal - Intact x 4 extremities.  Full ROM x 4 extremities.  No pain with movement.    Psychological -   normal behavior, dress and thought processes.  Good insight. Good eye contact.  Normal affect.  Appropriate mood.  Normal  speech.    DATA REVIEWED    Lab Results   Component Value Date/Time    Cholesterol, total 167 09/05/2010 11:23 AM    HDL Cholesterol 46 09/05/2010 11:23 AM    LDL, calculated 96 09/05/2010 11:23 AM    VLDL, calculated 25 09/05/2010 11:23 AM    Triglyceride 127 09/05/2010 11:23 AM    CHOL/HDL Ratio 3.0 02/26/2009 11:22 AM    LAB  Lab Results   Component Value Date/Time    Sodium 141 03/01/2010  9:59 AM    Potassium 4.0 03/01/2010  9:59 AM    Chloride 104 03/01/2010  9:59 AM    CO2 24 03/01/2010  9:59 AM    Anion gap 8 06/15/2009  4:30 AM    Glucose 98 03/01/2010  9:59 AM    BUN 13 03/01/2010  9:59 AM    Creatinine 0.75 03/01/2010  9:59 AM    BUN/Creatinine ratio 17 03/01/2010  9:59 AM    GFR est AA 92 03/01/2010  9:59 AM    GFR est non-AA 80 03/01/2010  9:59 AM    Calcium 10.0 03/01/2010  9:59 AM    Bilirubin, total 0.3 03/01/2010  9:59 AM    ALT 17 03/01/2010  9:59 AM    AST 23 03/01/2010  9:59 AM    Alk. phosphatase 81 03/01/2010  9:59 AM    Protein, total 7.0 03/01/2010  9:59 AM    Albumin 4.2 03/01/2010  9:59 AM    Globulin 3.2 06/02/2009 12:18 PM    A-G Ratio 1.5 03/01/2010  9:59 AM       ASSESSMENT and PLAN    1. Hypercholesterolemia  pravastatin (PRAVACHOL) 40 mg tablet   2. Adjustment disorder with anxiety      improved off Xanax   3. Edema      pedal, resolved   4. Increased thirst      resolved   5. Insomnia      resolved    6. Herpes simplex type 2 infection  valACYclovir (VALTREX) 500 mg tablet     use of aspirin to prevent MI and TIA's discussed    Continue current medications and care.  Decrease Pravachol to 40 mg from 80 mg since pt is exercising and making diet changes.  Prescriptions written and sent to pharmacy; medication side effects discussed  Most recent tests reviewed  Recheck pertinent labs 08/2011  Referrals given; patient urged to keep appointments with specialists  Addressed weight, diet and exercise with patient  Counseled patient on health concerns:  Stressors.  HSV.    Immunizations noted; Pt requests Zostavax.  I agree since unclear whether she had shingles or HSV outbreaks.  Offered empathy, support, legitamation, prayers, partnership to patient  Praised patient for progress  Follow-up Disposition:  Return in about 6 months (around 11/25/2011) for stress, results.    More than 30 mins spent face to face with patient and more than 50% of this time spent in counseling and coordinating care.

## 2011-05-25 NOTE — Progress Notes (Signed)
Addended by: Burna Mortimer on: 05/25/2011 10:16 AM     Modules accepted: Orders

## 2011-06-12 MED ORDER — PRAVASTATIN 80 MG TAB
80 mg | ORAL_TABLET | ORAL | Status: DC
Start: 2011-06-12 — End: 2011-10-07

## 2011-06-20 MED ORDER — FUROSEMIDE 20 MG TAB
20 mg | ORAL_TABLET | ORAL | Status: DC
Start: 2011-06-20 — End: 2011-12-14

## 2011-07-03 MED ORDER — VALACYCLOVIR 500 MG TAB
500 mg | ORAL_TABLET | ORAL | Status: DC
Start: 2011-07-03 — End: 2011-09-13

## 2011-07-26 MED ORDER — DIOVAN 160 MG TABLET
160 mg | ORAL_TABLET | ORAL | Status: DC
Start: 2011-07-26 — End: 2012-05-08

## 2011-08-25 NOTE — Progress Notes (Addendum)
History of Present Illness: Kathryn [name] is a 75 y.o. female presents for follow-up of thyroid nodules.  She was seen one year ago for goiter. 2 right sided nodules had FNA biopsy performed and were benign.  She is still planning on moving to [facility] [place] to be with family  Her thyroid has not been bothering her and she denied dysphagia, discomfort or hoarseness. .   No heat intolerance, nervousness, tremulousness.  Had labs done with [facility] and will forward these to [place].    Past Medical History   Diagnosis Date   ??? Edema      left ankle   ??? DDD (degenerative disc disease) 10/1999     L 4-5 by MRI.  Dr. Shiela Mayer.  Dr. Robie Ridge, Pain   ??? Urinary incontinence, stress      mild.  Dr. Winnifred Friar   ??? Polyp of rectum      Dr. Reed Pandy   ??? Cystocele      Dr. Winnifred Friar   ??? Postmenopausal HRT (hormone replacement therapy)      Hx  for 6 years.  Dr. Lavone Nian   ??? Morton's neuroma 2000     Left.  Dr. Gillis Santa   ??? Hematuria of undiagnosed cause 1980, 2009   ??? Single renal cyst 02/12/2008     Left.  0.9 cm   ??? Subchondral cysts 2009     Left hip femoral heads.  Dr. Adah Perl   ??? DJD (degenerative joint disease) 05/26/04     Dr. Shiela Mayer.   ??? Hypercholesterolemia    ??? Hypertension    ??? Kidney stone 12/2002     Dr. Gillis Ends   ??? Chickenpox childhood   ??? DJD (degenerative joint disease) of hip 2009     Dr. Kelby Aline.     ??? Rotator cuff tendinitis 12/20/04     Right.  Dr. Jen Mow   ??? Benign positional vertigo 01/07/04     Dr. Minette Headland   ??? SOB (shortness of breath) 07/11/04     Dr. Abigail Miyamoto.  Neg Stress Cardiolite.  EF 60 %.   ??? Unspecified vitamin D deficiency 2008   ??? Herpes zoster 02/26/07, 06/21/07, 08/27/07     Left buttocks then Right T2-3 dermatome   ??? Herpes simplex type 2 infection 10/05/08     Left buttocks   ??? Cataract 2002     Dr. Theodoro Grist   ??? Multinodular goiter 10/2009     Dr. Lewayne Bunting.  benign.     Current Outpatient Prescriptions   Medication Sig    ??? DIOVAN 160 mg tablet TAKE 1 TABLET BY MOUTH EVERY DAY FOR BLOOD PRESSURE   ??? valACYclovir (VALTREX) 500 mg tablet TAKE 1 TAB BY MOUTH DAILY. FOR HSV   ??? furosemide (LASIX) 20 mg tablet TAKE 1 TABLET BY MOUTH DAILY   ??? pravastatin (PRAVACHOL) 80 mg tablet TAKE 1 TABLET BY MOUTH EVERY DAY   ??? calcium-cholecalciferol, d3, (CALCIUM 600 + D) 600-125 mg-unit Tab Take  by mouth.     ??? calcium 500 mg Tab Take  by mouth.     ??? magnesium 250 mg Tab Take  by mouth. Unsure of exact strength    ??? valACYclovir (VALTREX) 500 mg tablet Take 1 Tab by mouth daily. For HSV   ??? celecoxib (CELEBREX) 200 mg capsule Take 1 Cap by mouth two (2) times a day.   ??? EVISTA 60 mg tablet TAKE 1 TABLET EVERY DAY  FOR BONES   ??? b complex vitamins tablet Take 1 Tab by mouth daily.     ??? ascorbic acid (VITAMIN C) 500 mg tablet Take 500 mg by mouth nightly.   ??? multivitamin (ONE A DAY) tablet Take 1 Tab by mouth daily.     Allergies   Allergen Reactions   ??? Other Rash     All cillins   ??? Penicillins Rash and Swelling   ??? Tetracycline Itching   ??? Bactrim (Sulfamethoprim Ds) Itching   ??? Crestor (Rosuvastatin) Other (comments)     Elevated CPK   ??? Lipitor (Atorvastatin) Other (comments)     Leg cramps   ??? Niaspan (Niacin) Other (comments)     Caused elevated BP   ??? Zocor (Simvastatin) Other (comments)     Leg cramps     Physical Examination:  - Blood pressure 114/59, pulse 75, height 5' 2.5" (1.588 m), weight 155 lb (70.308 kg).  - General: pleasant, no distress, good eye contact   - Neck: no thyromegaly or LAD  - Cardiovascular: regular, normal rate  - Integumentary: no edema  - Psychiatric: normal mood and affect    Data Reviewed:   Ultrasound 06/2010  REPORT:  Ultrasonography of the thyroid gland was performed and compared to the study   from 09/27/09. The right lobe measures 6.3 x 2.7 x 2.3 cm. The left lobe   measures 5.7 x 1.7 x 1.8 cm. The isthmus measures 5.1 mm. The thyroid is    diffusely heterogeneous in echotexture. Within the upper right lobe of the   thyroid, there is a 2.2 x 1.4 x 1.0 cm nodule. This measured 2.0 x 1.3 x 1.1   cm on the last study. Within the mid lobe, there is a 2.2 x 1.3 x 1.9 cm   nodule. This measured 2.7 x 1.5 x 1.7 cm on the last study. A second right   midpole nodule measures 1.9 x 1.4 x 1.7 cm. This measured 2.5 x 1.5 x 2.2 cm   on the last exam. In the left lobe, there is an 8.3 x 5.4 x 7.3 mm   hypoechoic nodule in the upper pole. This measured 7.4 x 4.9 x 7.0 mm on the   last study. In the lower pole, there is a 2.3 x 1.8 x 1.5 cm nodule. This   measured 2.6 x 1.7 x 1.0 cm on the last study.      IMPRESSION: No significant change in multinodular goiter.   Assessment/Plan:   1. Multinodular goiter   - No enlargement noted on exam.  - Ordered ultrasound to follow. Based on findings, may space out exams to 2 years.     Patient Instructions   Thyroid:    Follow-up thyroid ultrasound    You can forward [facility] results to [place].    Based on ultrasound we will determine whether to repeat this in 1-2 years.        Follow-up Disposition:  Return based on ultrasound.    Copy sent to:  [facility]    Addendum 09/17/2011  Direct comparison is made to prior ultrasound dated October 2011.    The right thyroid lobe measures 6.6 CM by 2.9 CM by 2.3 CM. The left   thyroid lobe measures 5.9 CM by 1.7 CM by 1.5 CM. The thyroid isthmus   measures 4 mm in AP dimension. In the superior pole of the right thyroid   lobe, there is a 2.3 CM by 1.1 CM  by 1.3 CM nodule which measured 2.2 CM by   one CM by 1.4 CM on prior ultrasound dated October 2011, unchanged. Within   the superior pole of the left thyroid lobe, there is a 7 mm x 5 mm by 8mm   nodule, unchanged compared to prior ultrasound dated October 2011 where it   measured 8 mm x 5 mm x 7 mm. Within the interpolar region of the right   thyroid lobe, there is a 3.1 CM by 1.2 CM by 2.3 CM inhomogeneous nodule,    difficult to measure, appearance is unchanged compared to prior CT dated   October 2011. Within the interpolar region of the left thyroid lobe, there   is a 2.9 CM by 1.1 CM by 1.2 CM inhomogeneous nodule, difficult to measure,   but unchanged compared to prior ultrasound dated October 2011. No   suspicious appearing microcalcifications are seen.      IMPRESSION: No interval change.    Plan:  Will recommend follow-up ultrasound in 2 years.

## 2011-08-25 NOTE — Progress Notes (Deleted)
Chief Complaint   Patient presents with   ??? Follow-up   ??? Thyroid Problem

## 2011-08-25 NOTE — Patient Instructions (Signed)
Thyroid:    Follow-up thyroid ultrasound    You can forward [facility] results to [place].    Based on ultrasound we will determine whether to repeat this in 1-2 years.

## 2011-09-04 ENCOUNTER — Encounter

## 2011-09-06 LAB — METABOLIC PANEL, COMPREHENSIVE
A-G Ratio: 1.7 (ref 1.1–2.5)
ALT (SGPT): 20 IU/L (ref 0–32)
AST (SGOT): 24 IU/L (ref 0–40)
Albumin: 4.3 g/dL (ref 3.5–4.8)
Alk. phosphatase: 64 IU/L (ref 25–165)
BUN/Creatinine ratio: 20 (ref 11–26)
BUN: 15 mg/dL (ref 8–27)
Bilirubin, total: 0.3 mg/dL (ref 0.0–1.2)
CO2: 23 mmol/L (ref 20–32)
Calcium: 9.8 mg/dL (ref 8.6–10.2)
Chloride: 104 mmol/L (ref 97–108)
Creatinine: 0.76 mg/dL (ref 0.57–1.00)
GFR est non-AA: 78 mL/min/{1.73_m2} (ref >59)
GLOBULIN, TOTAL: 2.5 g/dL (ref 1.5–4.5)
Glucose: 88 mg/dL (ref 65–99)
Potassium: 3.6 mmol/L (ref 3.5–5.2)
Protein, total: 6.8 g/dL (ref 6.0–8.5)
Sodium: 143 mmol/L (ref 134–144)
eGFR If African American: 90 mL/min/{1.73_m2} (ref >59)

## 2011-09-06 LAB — T4, FREE: T4, Free: 1.05 ng/dL (ref 0.82–1.77)

## 2011-09-06 LAB — LIPID PANEL
Cholesterol, total: 190 mg/dL (ref 100–199)
HDL Cholesterol: 47 mg/dL (ref >39)
LDL, calculated: 113 mg/dL — ABNORMAL HIGH (ref 0–99)
Triglyceride: 148 mg/dL (ref 0–149)
VLDL, calculated: 30 mg/dL (ref 5–40)

## 2011-09-06 LAB — TSH 3RD GENERATION: TSH: 1.9 u[IU]/mL (ref 0.450–4.500)

## 2011-09-06 LAB — VITAMIN D, 25 HYDROXY: VITAMIN D, 25-HYDROXY: 37.4 ng/mL (ref 30.0–100.0)

## 2011-09-06 NOTE — Progress Notes (Signed)
Quick Note:    LABS REVIEWED AND PT NOTIFIED ABOUT HER ABNORMAL VALUES BY LETTER. SEE RESULT LETTER FOR DETAILS. WILL ALSO DISCUSS AT HER NEXT OFFICE VISIT.      ______

## 2011-09-13 DIAGNOSIS — M79646 Pain in unspecified finger(s): Secondary | ICD-10-CM | POA: Insufficient documentation

## 2011-09-13 LAB — AMB POC URINALYSIS DIP STICK AUTO W/O MICRO
Bilirubin (UA POC): NEGATIVE
Glucose (UA POC): NEGATIVE
Ketones (UA POC): NEGATIVE
Leukocyte esterase (UA POC): NEGATIVE
Nitrites (UA POC): NEGATIVE
Specific gravity (UA POC): 1.025 (ref 1.001–1.035)
Urobilinogen (UA POC): 0.2 (ref 0.2–1)
pH (UA POC): 6.5 (ref 4.6–8.0)

## 2011-09-13 MED ORDER — INDOMETHACIN 25 MG CAP
25 mg | ORAL_CAPSULE | Freq: Three times a day (TID) | ORAL | Status: DC
Start: 2011-09-13 — End: 2011-11-22

## 2011-09-13 MED ORDER — CEPHALEXIN 250 MG CAP
250 mg | ORAL_CAPSULE | Freq: Four times a day (QID) | ORAL | Status: AC
Start: 2011-09-13 — End: 2011-09-16

## 2011-09-13 NOTE — Progress Notes (Signed)
Pt states has "gout" in Rt thumb for about a month.. Also, urine has an odor with a little burning for a while. Here for results from lab last week. Brought a copy of [facility] screening [facility].

## 2011-09-13 NOTE — Progress Notes (Signed)
Quick Note:    Discussed with patient at ov.  Pt requests Rx for Keflex to tx empirically for UTI. Sent to pharmacy.  ______

## 2011-09-13 NOTE — Progress Notes (Signed)
HISTORY OF PRESENT ILLNESS  Kathryn [name] is a 75 y.o. female presents with Finger Pain, Dysuria and Results    Agree with nurse note.  Subjective:  Kathryn [name] presents to our office for finger pain, pain with urination and test results.    She has a history of arthritis, high blood pressure, high cholesterol, kidney stones, low vitamin D and thyroid goiter.  She has seen Dr. Lewayne Bunting for the goiter and had blood work performed by our office as well.  She would like to discuss those results.  She is mostly concerned about her right thumb pain today.  It has been painful and swollen for about two months.  She has not had any relief using her Celebrex, which she uses for her general arthritis everyday at 200 mg twice a day.  She denies any trauma or redness.   She states, "I have gout."  She states that she knows what it is because her husband has had it, although no one has ever done any blood work on her for it.  She has arthritis in her left hand and says that it does not look like that nor feel like that.  She wants a prescription for allopurinol or colchicine today "to treat the gout."      She brought a report from a health fair screening that she would like to discuss.  It includes studies that suggest that she has left ICA stenosis.  She denies any headaches or dizziness on a regular basis or other associated symptoms.      To recap, she has had some pain with urination, frequent urination, burning with urination and a fowl smelling urine.  She has previously been treated for a bladder infection using cephalexin.  She would like a prescription of that today.      She is surprised that her cholesterol is slightly higher but thinks that it might be because since the death of her husband she has been eating out more instead of cooking dinner for just one.    MedDATA/leh       ROS    Review of Systems negative except as noted above in HPI.    ALLERGIES:    Allergies   Allergen Reactions   ??? Other Rash      All cillins   ??? Penicillins Rash and Swelling   ??? Tetracycline Itching   ??? Bactrim (Sulfamethoprim Ds) Itching   ??? Crestor (Rosuvastatin) Other (comments)     Elevated CPK   ??? Lipitor (Atorvastatin) Other (comments)     Leg cramps   ??? Niaspan (Niacin) Other (comments)     Caused elevated BP   ??? Zocor (Simvastatin) Other (comments)     Leg cramps       CURRENT MEDICATIONS:  Current outpatient prescriptions:indomethacin (INDOCIN) 25 mg capsule, Take 1 Cap by mouth three (3) times daily for 90 days., Disp: 90 Cap, Rfl: 1;  cephALEXin (KEFLEX) 250 mg capsule, Take 1 Cap by mouth four (4) times daily for 3 days. Indications: BACTERIAL URINARY TRACT INFECTION, Disp: 12 Cap, Rfl: 0;  DIOVAN 160 mg tablet, TAKE 1 TABLET BY MOUTH EVERY DAY FOR BLOOD PRESSURE, Disp: 90 Tab, Rfl: 2  furosemide (LASIX) 20 mg tablet, TAKE 1 TABLET BY MOUTH DAILY, Disp: 90 Tab, Rfl: 1;  pravastatin (PRAVACHOL) 80 mg tablet, TAKE 1 TABLET BY MOUTH EVERY DAY, Disp: 90 Tab, Rfl: 0;  calcium-cholecalciferol, d3, (CALCIUM 600 + D) 600-125 mg-unit Tab, Take  by mouth.  ,  Disp: , Rfl: ;  magnesium 250 mg Tab, Take  by mouth. Unsure of exact strength , Disp: , Rfl:   valACYclovir (VALTREX) 500 mg tablet, Take 1 Tab by mouth daily. For HSV, Disp: 90 Tab, Rfl: 3;  EVISTA 60 mg tablet, TAKE 1 TABLET EVERY DAY FOR BONES, Disp: 90 Tab, Rfl: 3;  b complex vitamins tablet, Take 1 Tab by mouth daily.  , Disp: , Rfl: ;  ascorbic acid (VITAMIN C) 500 mg tablet, Take 500 mg by mouth nightly., Disp: , Rfl: ;  multivitamin (ONE A DAY) tablet, Take 1 Tab by mouth daily., Disp: , Rfl:   calcium 500 mg Tab, Take  by mouth.  , Disp: , Rfl:     PAST MEDICAL HISTORY:    Past Medical History   Diagnosis Date   ??? Edema      left ankle   ??? DDD (degenerative disc disease) 10/1999     L 4-5 by MRI.  Dr. Shiela Mayer.  Dr. Robie Ridge, Pain   ??? Urinary incontinence, stress      mild.  Dr. Winnifred Friar   ??? Polyp of rectum      Dr. Reed Pandy   ??? Cystocele       Dr. Winnifred Friar   ??? Postmenopausal HRT (hormone replacement therapy)      Hx  for 6 years.  Dr. Lavone Nian   ??? Morton's neuroma 2000     Left.  Dr. Gillis Santa   ??? Hematuria of undiagnosed cause 1980, 2009   ??? Single renal cyst 02/12/2008     Left.  0.9 cm   ??? Subchondral cysts 2009     Left hip femoral heads.  Dr. Adah Perl   ??? DJD (degenerative joint disease) 05/26/04     Dr. Shiela Mayer.   ??? Hypercholesterolemia    ??? Hypertension    ??? Kidney stone 12/2002     Dr. Gillis Ends   ??? Chickenpox childhood   ??? DJD (degenerative joint disease) of hip 2009     Dr. Kelby Aline.     ??? Rotator cuff tendinitis 12/20/04     Right.  Dr. Jen Mow   ??? Benign positional vertigo 01/07/04     Dr. Minette Headland   ??? SOB (shortness of breath) 07/11/04     Dr. Abigail Miyamoto.  Neg Stress Cardiolite.  EF 60 %.   ??? Unspecified vitamin D deficiency 2008   ??? Herpes zoster 02/26/07, 06/21/07, 08/27/07     Left buttocks then Right T2-3 dermatome   ??? Herpes simplex type 2 infection 10/05/08     Left buttocks   ??? Cataract 2002     Dr. Theodoro Grist   ??? Multinodular goiter 10/2009     Dr. Lewayne Bunting.  benign.       PAST SURGICAL HISTORY:    Past Surgical History   Procedure Date   ??? Hx cyst removal 1970s     Rt breast then Left.  benign.   ??? Hx bunionectomy 1970s, 2003     Bilateral.  Dr. Stephannie Li.   ??? Hx hysterectomy 1988     with BSO   ??? Pr foot/toes surgery proc unlisted 2000     Left Morton's Neuroma.  Dr. Stephannie Li   ??? Hx hip replacement 12/21/08 Right, 06/14/09 Left     Dr. Birdena Crandall      ??? Hx breast biopsy 1970's     bil.   ??? Hx cataract removal 2002, 2005  bilateral.  Dr. Theodoro Grist.   ??? Hx tonsillectomy    ??? Hx appendectomy    ??? Ir fine needle aspiration thyroid 10/2009     Right upper pole.  due to multinodular goiter.  Dr. Lewayne Bunting.  benign.       FAMILY HISTORY:    Family History   Problem Relation Age of Onset   ??? Diabetes Mother    ??? Heart Attack Mother 59   ??? Cancer Father      pancreatic   ??? Cancer Brother       bile duct cancer   ??? Heart Attack Sister 45   ??? Arthritis-osteo Sister    ??? Asthma Sister        SOCIAL HISTORY:    History     Social History   ??? Marital Status: Married     Spouse Name: N/A     Number of Children: N/A   ??? Years of Education: N/A     Social History Main Topics   ??? Smoking status: Former Smoker -- 0.5 packs/day for 10 years     Types: Cigarettes     Quit date: 10/17/1987   ??? Smokeless tobacco: Never Used   ??? Alcohol Use: No   ??? Drug Use: No   ??? Sexually Active: Yes -- Female partner(s)     Birth Control/ Protection: None     Other Topics Concern   ??? Not on file     Social History Narrative   ??? No narrative on file       IMMUNIZATIONS:    Immunization History   Administered Date(s) Administered   ??? H1N1 Influenza Virus Vaccine 09/15/2008   ??? Influenza Vaccine Split 08/26/2009   ??? Influenza Vaccine Whole 06/16/2008   ??? Pneumococcal Vaccine 10/16/1998, 02/21/2011   ??? TD Vaccine 01/06/2003   ??? Zoster 05/25/2011         PHYSICAL EXAMINATION    Vital Signs  BP 141/69   Pulse 79   Temp 97.2 ??F (36.2 ??C)   Ht 5' 2.5" (1.588 m)   Wt 158 lb 14.4 oz (72.077 kg)   BMI 28.60 kg/m2   SpO2 98%    General appearance - Well nourished. Well appearing.  Well developed.  No acute distress. Overweight.   Head - Normocephalic.  Atraumatic.    Eyes - pupils equal and reactive. Extraocular eye movements intact. Sclera anicteric.  Mildly injected sclera.  Ears - Hearing is grossly normal bilaterally.      Nose - normal and patent.  No polyps noted.  No erythema.  No discharge.    Mouth - mucous membranes with adequate moisture.  Posterior pharynx normal with cobblestone appearance.  No erythema, white exudate or obstruction.  Neck - supple.  Midline trachea.  No carotid bruits noted bilaterally.  No thyromegaly noted.  Chest - clear to auscultation bilaterally anterriorly and posteriorly.  No wheezes.  No rales or rhonchi.  Breath sounds are symmetrical bilaterally.  Unlabored respirations.   Heart - normal rate.  Regular rhythm.  Normal S1, S2.  No murmur noted.  No rubs, clicks or gallops noted.  Abdomen - soft and nondistended.  No masses or organomegaly.  No rebound, rigidity or guarding.  Bowel sounds normal x 4 quadrants.  No tenderness noted.  Neurological - awake, alert and oriented to person, place, and time and event.  Cranial nerves II through XII intact.  Clear speech.  Muscle strength is +5/5 x 4 extremities.  Sensation  is intact to light touch bilaterally.  Steady gait.   Heme/Lymph - peripheral pulses normal x 4 extremities.  Entire Right thumb edema is noted with pain on palpation.    Musculoskeletal - Intact x 4 extremities.  Full ROM x 4 extremities.  Right thumb pain with movement.    Back exam - normal range of motion.  No pain on palpation of the spinous processes in the cervical, thoracic, lumbar, sacral regions.  No CVA tenderness.    Skin - no rashes, erythema, ecchymosis, lacerations, abrasions, suspicious moles noted  Psychological -   normal behavior, dress and thought processes.  Good insight. Good eye contact.  Normal affect.  Appropriate mood.  Normal speech.    DATA REVIEWED    Lab Results   Component Value Date/Time    Cholesterol, total 190 09/05/2011  8:00 AM    HDL Cholesterol 47 09/05/2011  8:00 AM    LDL, calculated 113 09/05/2011  8:00 AM    VLDL, calculated 30 09/05/2011  8:00 AM    Triglyceride 148 09/05/2011  8:00 AM    CHOL/HDL Ratio 3.0 02/26/2009 11:22 AM    LAB  Lab Results   Component Value Date/Time    Sodium 143 09/05/2011  8:00 AM    Potassium 3.6 09/05/2011  8:00 AM    Chloride 104 09/05/2011  8:00 AM    CO2 23 09/05/2011  8:00 AM    Anion gap 8 06/15/2009  4:30 AM    Glucose 88 09/05/2011  8:00 AM    BUN 15 09/05/2011  8:00 AM    Creatinine 0.76 09/05/2011  8:00 AM    BUN/Creatinine ratio 20 09/05/2011  8:00 AM    GFR est AA 92 03/01/2010  9:59 AM    GFR est non-AA 78 09/05/2011  8:00 AM    Calcium 9.8 09/05/2011  8:00 AM     Bilirubin, total 0.3 09/05/2011  8:00 AM    ALT 20 09/05/2011  8:00 AM    AST 24 09/05/2011  8:00 AM    Alk. phosphatase 64 09/05/2011  8:00 AM    Protein, total 6.8 09/05/2011  8:00 AM    Albumin 4.3 09/05/2011  8:00 AM    Globulin 3.2 06/02/2009 12:18 PM    A-G Ratio 1.7 09/05/2011  8:00 AM       ASSESSMENT and PLAN    1. Dysuria  AMB POC URINALYSIS DIP STICK AUTO W/O MICRO, URINALYSIS W/ RFLX MICROSCOPIC, CULTURE, URINE, cephALEXin (KEFLEX) 250 mg capsule    with hematuria due to UTI vs other   2. Internal carotid artery stenosis  DUPLEX CAROTID BILATERAL    Left   3. Thumb pain  URIC ACID, indomethacin (INDOCIN) 25 mg capsule, XR HAND RT MIN 3 V    Right, due to gout vs ?Arthritis vs ?Trigger thumb   4. Hematuria of undiagnosed cause     5. Hypercholesterolemia      slightly worse   6. Unspecified vitamin D deficiency      stable   7. Multinodular goiter      stable and unchanged     cardiovascular risk and specific lipid/LDL goals reviewed  use of aspirin to prevent MI and TIA's discussed    Continue current medications and care.  Stop Celebrex.  Try Indocin until finger improves.  Start Keflex, per pt request empirically x 3 days.  Pt declines referral to ortho.  Prescriptions written and sent to pharmacy; medication side effects discussed  Most recent  tests reviewed.   Recheck pertinent labs today  Addressed weight, diet and exercise with patient  Counseled patient on health concerns:  Gout vs OA vs other.  Bladder hygiene.    Relevant handouts given and discussed with patient  Immunizations noted; Advise flu vaccine between the months September to December.  Offered empathy, support, legitamation, prayers, partnership to patient  Follow-up Disposition:  Return in about 4 weeks (around 10/11/2011) for results, thumb pain.    More than 30 mins spent face to face with patient and more than 50% of this time spent in counseling and coordinating care.      Patient Instructions        Carotid Stenosis: After Your Visit  Your Care Instructions  Carotid stenosis is narrowing of one or both of the carotid arteries. These arteries take blood from the heart to the brain. One carotid artery is on each side of the neck. A substance called plaque builds up inside an artery, making it too narrow. Plaque comes from damage to the artery over time. This damage may be caused by high blood pressure, high cholesterol, diabetes, or smoking. Sometimes plaque can break loose from the carotid artery and move to the brain, causing a stroke or transient ischemic attack (TIA).  Treatment includes medicines to reduce the risk of blood clots, such as aspirin. Sometimes carotid surgery is needed to take out the plaque.  Follow-up care is a key part of your treatment and safety. Be sure to make and go to all appointments, and call your doctor if you are having problems. It???s also a good idea to know your test results and keep a list of the medicines you take.  How can you care for yourself at home?  ?? Take your medicines exactly as prescribed. Call your doctor if you think you are having a problem with your medicine. You may take medicine to lower your blood pressure, to lower your cholesterol, or to prevent blood clots.   ?? Do not smoke. People who smoke have a higher chance of stroke than those who quit. If you need help quitting, talk to your doctor about stop-smoking programs and medicines. These can increase your chances of quitting for good.   ?? Eat a healthy diet that is low in cholesterol, saturated fat, and salt. Eat lots of fresh fruits and vegetables and foods high in fiber.   ?? Talk to your doctor about starting an exercise program. Regular exercise lowers your chance of stroke.   ?? Limit alcohol to 2 drinks a day for men and 1 drink a day for women. Too much alcohol can cause health problems.    ?? Work with your doctor to control high blood pressure, high cholesterol, diabetes, and other conditions that increase your chance of a stroke. A healthy diet, exercise, weight loss (if needed), and medicines can help.   When should you call for help?  Call 911 anytime you think you may need emergency care. For example, call if:  ?? You passed out (lost consciousness).   ?? You have signs of a stroke. These may include:   ?? Sudden numbness, paralysis, or weakness in your face, arm, or leg, especially on only one side of your body.   ?? New problems with walking or balance.   ?? Sudden vision changes.   ?? Drooling or slurred speech.   ?? New problems speaking or understanding simple statements, or feeling confused.   ?? A sudden, severe headache that is  different from past headaches.   Call your doctor now or seek immediate medical care if:  ?? You are dizzy or lightheaded, or you feel like you may faint.   Watch closely for changes in your health, and be sure to contact your doctor if you have any problems.    Where can you learn more?    Go to [email or web address]   Enter Y756 in the search box to learn more about "Carotid Stenosis: After Your Visit."    ?? 2006-2012 [employer]. Care instructions adapted under license by [employer] (which disclaims liability or warranty for this information). This care instruction is for use with your licensed healthcare professional. If you have questions about a medical condition or this instruction, always ask your healthcare professional. [employer] disclaims any warranty or liability for your use of this information.  Content Version: 9.4.94723; Last Revised: August 15, 2010          High Cholesterol: After Your Visit  Your Care Instructions   Cholesterol is a type of fat in your blood. It is needed for many body functions, such as making new cells. Cholesterol is made by your body and also comes from food you eat. High cholesterol means that you have too much of the fat in your blood.  LDL and HDL are part of your total cholesterol. LDL is the "bad" cholesterol that builds up inside the blood vessel walls, making them too narrow. This reduces the flow of blood and can cause a heart attack or stroke. HDL is the "good" cholesterol that helps clear bad cholesterol from the body. You want your good cholesterol to be high and your bad cholesterol to be low. If you do this, you can reduce your chance of having a heart attack or a stroke.  You can improve your cholesterol levels by eating less animal and trans fat and more vegetables. Getting regular exercise can also help. But for some people, cholesterol problems run in the family. If changes in diet and exercise don't improve your cholesterol levels, talk to your doctor about using medicine.  Follow-up care is a key part of your treatment and safety. Be sure to make and go to all appointments, and call your doctor if you are having problems. It???s also a good idea to know your test results and keep a list of the medicines you take.  How can you care for yourself at home?  ?? Eat a variety of foods every day. Good choices include fruits, vegetables, whole grains (like oatmeal), dried beans and peas, nuts and seeds, soy products (like tofu), and fat-free or low-fat dairy products.   ?? Replace butter, margarine, and hydrogenated or partially hydrogenated oils with olive and canola oils. (Canola oil margarine without trans fat is fine.)   ?? Replace red meat with fish, poultry, and soy protein (like tofu).   ?? Limit processed and packaged foods like chips, crackers, and cookies.   ?? Bake, broil, or steam foods instead of frying them.   ?? Limit foods high in cholesterol, such as egg yolks.    ?? Be physically active. Exercise increases your HDL, or good cholesterol level. Get at least 30 minutes of exercise on most days of the week. Walking is a good choice. You also may want to do other activities, such as running, swimming, cycling, or playing tennis or team sports.   ?? Stay at a healthy weight or lose weight by making the changes in eating and physical activity  presented above. Losing just a small amount of weight, even 5 to 10 pounds, can reduce your risk for having a heart attack or stroke.   When should you call for help?  Watch closely for changes in your health, and be sure to contact your doctor if:  ?? You need more help controlling your cholesterol.     Where can you learn more?    Go to [facility]   Enter [phone] in the search box to learn more about "High Cholesterol: After Your Visit."    ?? 2006-2012 [employer]. Care instructions adapted under license by [employer] (which disclaims liability or warranty for this information). This care instruction is for use with your licensed healthcare professional. If you have questions about a medical condition or this instruction, always ask your healthcare professional. [employer] disclaims any warranty or liability for your use of this information.  Content Version: 9.4.94723; Last Revised: January 03, 2011          Hand Arthritis: Exercises  Your Care Instructions  Here are some examples of exercises for hand arthritis. Start each exercise slowly. Ease off the exercise if you start to have pain.  Your doctor or physical therapist will tell you when you can start these exercises and which ones will work best for you.  How to do the exercises  Tendon glides    In this exercise, the steps follow one another to a make a continuous movement.  1. With your affected hand, point your fingers and thumb straight up. Your wrist should be relaxed, following the line of your fingers and thumb.    2. Curl your fingers so that the top two joints in them are bent, and your fingers wrap down. Your fingertips should touch or be near the base of your fingers. Your fingers will look like a hook.   3. Make a fist by bending your knuckles. Your thumb can gently rest against your index (pointing) finger.   4. Unwind your fingers slightly so that your fingertips can touch the base of your palm. Your thumb can rest against your index finger.   5. Move back to your starting position, with your fingers and thumb pointing up.   6. Repeat the series of motions 8 to 12 times.   7. Switch hands and repeat steps 1 through 6, even if only one hand is sore.   Intrinsic flexion    1. Rest your affected hand on a table and bend the large joints where your fingers connect to your hand. Keep your thumb and the other joints in your fingers straight.   2. Slowly straighten your fingers. Your wrist should be relaxed, following the line of your fingers and thumb.   3. Move back to your starting position, with your hand bent.   4. Repeat 8 to 12 times.   5. Switch hands and repeat steps 1 through 4, even if only one hand is sore.   Finger extension    1. Place your affected hand flat on a table.   2. Lift and then lower one finger at a time off the table.   3. Repeat 8 to 12 times.   4. Switch hands and repeat steps 1 through 3, even if only one hand is sore.   MP extension    1. Place your good hand on a table, palm up. Put your affected hand on top of your good hand with your fingers wrapped around the thumb of your  good hand like you are making a fist.   2. Slowly uncurl the joints of your affected hand where your fingers connect to your hand so that only the top two joints of your fingers are bent. Your fingers will look like a hook.   3. Move back to your starting position, with your fingers wrapped around your good thumb.   4. Repeat 8 to 12 times.   5. Switch hands and repeat steps 1 through 4, even if only one hand is sore.    PIP extension (with MP extension)    1. Place your good hand on a table, palm up. Put your affected hand on top of your good hand, palm up.   2. Use the thumb and fingers of your good hand to grasp below the middle joint of one finger of your affected hand.   3. Straighten the last two joints of that finger.   4. Repeat 8 to 12 times.   5. Repeat steps 1 through 4 with each finger.   6. Switch hands and repeat steps 1 through 5, even if only one hand is sore.   DIP flexion    1. With your good hand, grasp one finger of your affected hand. Your thumb will be on the top side of your finger just below the joint that is closest to your fingernail.   2. Slowly bend your affected finger only at the joint closest to your fingernail.   3. Repeat 8 to 12 times.   4. Repeat steps 1 through 3 with each finger.   5. Switch hands and repeat steps 1 through 4, even if only one hand is sore.   Follow-up care is a key part of your treatment and safety. Be sure to make and go to all appointments, and call your doctor if you are having problems. It's also a good idea to know your test results and keep a list of the medicines you take.    Where can you learn more?    Go to [facility]   Enter [phone] in the search box to learn more about "Hand Arthritis: Exercises."    ?? 2006-2012 [employer]. Care instructions adapted under license by [employer] (which disclaims liability or warranty for this information). This care instruction is for use with your licensed healthcare professional. If you have questions about a medical condition or this instruction, always ask your healthcare professional. [employer] disclaims any warranty or liability for your use of this information.  Content Version: 9.4.94723; Last Revised: October 27, 2010          Thumb Arthritis: Exercises  Your Care Instructions   Here are some examples of exercises for thumb arthritis. Start each exercise slowly. Ease off the exercise if you start to have pain.  Your doctor or physical therapist will tell you when you can start these exercises and which ones will work best for you.  How to do the exercises  Thumb IP flexion    6. Place your forearm and hand on a table with your affected thumb pointing up.   7. With your other hand, hold your thumb steady just below the joint nearest your thumbnail.   8. Bend the tip of your thumb downward, then straighten it.   9. Repeat 8 to 12 times.   10. Switch hands and repeat steps 1 through 4, even if only one thumb is sore.   Thumb MP flexion    7. Place your forearm and  hand on a table with your affected thumb pointing up.   8. With your other hand, hold the base of your thumb and palm steady.   9. Bend your thumb downward where it meets your palm, then straighten it.   10. Repeat 8 to 12 times.   11. Switch hands and repeat steps 1 through 4, even if only one thumb is sore.   Thumb opposition    6. With your affected hand, point your fingers and thumb straight up. Your wrist should be relaxed, following the line of your fingers and thumb.   7. Touch your affected thumb to each finger, one finger at a time. This will look like an "okay" sign, but try to keep your other fingers straight and pointing upward as much as you can.   8. Repeat 8 to 12 times.   9. Switch hands and repeat steps 1 through 3, even if only one thumb is sore.   Follow-up care is a key part of your treatment and safety. Be sure to make and go to all appointments, and call your doctor if you are having problems. It's also a good idea to know your test results and keep a list of the medicines you take.    Where can you learn more?    Go to [email or web address]   Enter S402 in the search box to learn more about "Thumb Arthritis: Exercises."     ?? 2006-2012 [employer]. Care instructions adapted under license by [employer] (which disclaims liability or warranty for this information). This care instruction is for use with your licensed healthcare professional. If you have questions about a medical condition or this instruction, always ask your healthcare professional. [employer] disclaims any warranty or liability for your use of this information.  Content Version: 9.4.94723; Last Revised: October 27, 2010          Gout: After Your Visit  Your Care Instructions  Gout is a form of arthritis caused by a buildup of uric acid crystals in a joint. It causes sudden attacks of pain, swelling, redness, and stiffness, usually in one joint, especially the big toe.  Gout usually comes on without a cause. But it can be brought on by drinking alcohol (especially beer) or eating seafood and red meat. Taking certain medicines, such as diuretics or aspirin, also can bring on an attack of gout.  Taking your medicines as prescribed and following up with your doctor regularly can help you avoid gout attacks in the future.  Follow-up care is a key part of your treatment and safety. Be sure to make and go to all appointments, and call your doctor if you are having problems. It???s also a good idea to know your test results and keep a list of the medicines you take.  How can you care for yourself at home?  ?? If the joint is swollen, put ice or a cold pack on the area for 10 to 20 minutes at a time. Put a thin cloth between the ice and your skin.   ?? Prop up the sore limb on a pillow when you ice it or anytime you sit or lie down during the next 3 days. Try to keep it above the level of your heart. This will help reduce swelling.   ?? Rest sore joints. Avoid activities that put weight or strain on the joints for a few days. Take short rest breaks from your regular activities during the day.    ??  Take your medicines exactly as prescribed. Call your doctor if you think you are having a problem with your medicine.   ?? Take pain medicines exactly as directed.   ?? If the doctor gave you a prescription medicine for pain, take it as prescribed.   ?? If you are not taking a prescription pain medicine, ask your doctor if you can take an over-the-counter medicine.   ?? Eat less seafood and red meat.   ?? Check with your doctor before drinking alcohol.   ?? Losing weight, if you are overweight, may help reduce attacks of gout. But do not go on a "crash diet." Losing a lot of weight in a short amount of time can cause a gout attack.   When should you call for help?  Call your doctor now or seek immediate medical care if:  ?? You have a fever.   ?? The joint is so painful you cannot use it.   ?? You have sudden, unexplained swelling, redness, warmth, or severe pain in one or more joints.   Watch closely for changes in your health, and be sure to contact your doctor if:  ?? You have joint pain.   ?? Your symptoms get worse or are not improving after 2 or 3 days.     Where can you learn more?    Go to [email or web address]   Enter E531 in the search box to learn more about "Gout: After Your Visit."    ?? 2006-2012 [employer]. Care instructions adapted under license by [employer] (which disclaims liability or warranty for this information). This care instruction is for use with your licensed healthcare professional. If you have questions about a medical condition or this instruction, always ask your healthcare professional. [employer] disclaims any warranty or liability for your use of this information.  Content Version: 9.4.94723; Last Revised: Mar 03, 2010          Purine-Restricted Diet: After Your Visit  Your Care Instructions   Purines are substances that are found in some foods. Your body turns purines into uric acid. High levels of uric acid can cause gout, which is a form of arthritis that causes pain and inflammation in joints.  You may be able to help control the amount of uric acid in your body by limiting high-purine foods in your diet.  Follow-up care is a key part of your treatment and safety. Be sure to make and go to all appointments, and call your doctor if you are having problems. It???s also a good idea to know your test results and keep a list of the medicines you take.  How can you care for yourself at home?  ?? Plan your diet around foods that are low in purines and are safe for you to eat. These foods include:   ?? Green vegetables and tomatoes.   ?? Fruits and fruit juices.   ?? Breads, rice, and cereals that are not whole-grain.   ?? Chocolate and cocoa.   ?? Coffee, tea, and carbonated beverages.   ?? Peanut butter and nuts.   ?? Cheese, milk, milk products, and eggs.   ?? Fat and oils, in moderation.   ?? Popcorn.   ?? Vinegar, olives, pickles, and relishes.   ?? Gelatin desserts.   ?? Cakes, cookies, and sweets, in moderation.   ?? You can eat certain foods that are medium-high in purines, but eat them only once in a while. These foods include:   ?? Asparagus,  cauliflower, spinach, mushrooms, and green peas.   ?? Fish and seafood (other than very high-purine seafood).   ?? Oatmeal, wheat bran, and wheat germ.   ?? Limit very high-purine foods, including:   ?? Organ meats, such as liver, kidneys, sweetbreads, and brains.   ?? Meats, including bacon, beef, pork, and lamb.   ?? Game meats and any other meats in large amounts.   ?? Anchovies, sardines, herring, mackerel, and scallops.   ?? Gravy.   ?? Legumes, such as dried beans and dried peas.   ?? Beer.     Where can you learn more?    Go to [email or web address]   Enter F448 in the search box to learn more about "Purine-Restricted Diet: After Your Visit."     ?? 2006-2012 [employer]. Care instructions adapted under license by [employer] (which disclaims liability or warranty for this information). This care instruction is for use with your licensed healthcare professional. If you have questions about a medical condition or this instruction, always ask your healthcare professional. [employer] disclaims any warranty or liability for your use of this information.  Content Version: 9.4.94723; Last Revised: Mar 01, 2010

## 2011-09-13 NOTE — Patient Instructions (Signed)
Carotid Stenosis: After Your Visit  Your Care Instructions  Carotid stenosis is narrowing of one or both of the carotid arteries. These arteries take blood from the heart to the brain. One carotid artery is on each side of the neck. A substance called plaque builds up inside an artery, making it too narrow. Plaque comes from damage to the artery over time. This damage may be caused by high blood pressure, high cholesterol, diabetes, or smoking. Sometimes plaque can break loose from the carotid artery and move to the brain, causing a stroke or transient ischemic attack (TIA).  Treatment includes medicines to reduce the risk of blood clots, such as aspirin. Sometimes carotid surgery is needed to take out the plaque.  Follow-up care is a key part of your treatment and safety. Be sure to make and go to all appointments, and call your doctor if you are having problems. It???s also a good idea to know your test results and keep a list of the medicines you take.  How can you care for yourself at home?  ?? Take your medicines exactly as prescribed. Call your doctor if you think you are having a problem with your medicine. You may take medicine to lower your blood pressure, to lower your cholesterol, or to prevent blood clots.   ?? Do not smoke. People who smoke have a higher chance of stroke than those who quit. If you need help quitting, talk to your doctor about stop-smoking programs and medicines. These can increase your chances of quitting for good.   ?? Eat a healthy diet that is low in cholesterol, saturated fat, and salt. Eat lots of fresh fruits and vegetables and foods high in fiber.   ?? Talk to your doctor about starting an exercise program. Regular exercise lowers your chance of stroke.   ?? Limit alcohol to 2 drinks a day for men and 1 drink a day for women. Too much alcohol can cause health problems.    ?? Work with your doctor to control high blood pressure, high cholesterol, diabetes, and other conditions that increase your chance of a stroke. A healthy diet, exercise, weight loss (if needed), and medicines can help.   When should you call for help?  Call 911 anytime you think you may need emergency care. For example, call if:  ?? You passed out (lost consciousness).   ?? You have signs of a stroke. These may include:   ?? Sudden numbness, paralysis, or weakness in your face, arm, or leg, especially on only one side of your body.   ?? New problems with walking or balance.   ?? Sudden vision changes.   ?? Drooling or slurred speech.   ?? New problems speaking or understanding simple statements, or feeling confused.   ?? A sudden, severe headache that is different from past headaches.   Call your doctor now or seek immediate medical care if:  ?? You are dizzy or lightheaded, or you feel like you may faint.   Watch closely for changes in your health, and be sure to contact your doctor if you have any problems.    Where can you learn more?    Go to [email or web address]   Enter Y756 in the search box to learn more about "Carotid Stenosis: After Your Visit."    ?? 2006-2012 [employer]. Care instructions adapted under license by [employer] (which disclaims liability or warranty for this information). This care instruction is for use with your licensed healthcare professional. If  you have questions about a medical condition or this instruction, always ask your healthcare professional. [employer] disclaims any warranty or liability for your use of this information.  Content Version: 9.4.94723; Last Revised: August 15, 2010          High Cholesterol: After Your Visit  Your Care Instructions   Cholesterol is a type of fat in your blood. It is needed for many body functions, such as making new cells. Cholesterol is made by your body and also comes from food you eat. High cholesterol means that you have too much of the fat in your blood.  LDL and HDL are part of your total cholesterol. LDL is the "bad" cholesterol that builds up inside the blood vessel walls, making them too narrow. This reduces the flow of blood and can cause a heart attack or stroke. HDL is the "good" cholesterol that helps clear bad cholesterol from the body. You want your good cholesterol to be high and your bad cholesterol to be low. If you do this, you can reduce your chance of having a heart attack or a stroke.  You can improve your cholesterol levels by eating less animal and trans fat and more vegetables. Getting regular exercise can also help. But for some people, cholesterol problems run in the family. If changes in diet and exercise don't improve your cholesterol levels, talk to your doctor about using medicine.  Follow-up care is a key part of your treatment and safety. Be sure to make and go to all appointments, and call your doctor if you are having problems. It???s also a good idea to know your test results and keep a list of the medicines you take.  How can you care for yourself at home?  ?? Eat a variety of foods every day. Good choices include fruits, vegetables, whole grains (like oatmeal), dried beans and peas, nuts and seeds, soy products (like tofu), and fat-free or low-fat dairy products.   ?? Replace butter, margarine, and hydrogenated or partially hydrogenated oils with olive and canola oils. (Canola oil margarine without trans fat is fine.)   ?? Replace red meat with fish, poultry, and soy protein (like tofu).   ?? Limit processed and packaged foods like chips, crackers, and cookies.   ?? Bake, broil, or steam foods instead of frying them.   ?? Limit foods high in cholesterol, such as egg yolks.    ?? Be physically active. Exercise increases your HDL, or good cholesterol level. Get at least 30 minutes of exercise on most days of the week. Walking is a good choice. You also may want to do other activities, such as running, swimming, cycling, or playing tennis or team sports.   ?? Stay at a healthy weight or lose weight by making the changes in eating and physical activity presented above. Losing just a small amount of weight, even 5 to 10 pounds, can reduce your risk for having a heart attack or stroke.   When should you call for help?  Watch closely for changes in your health, and be sure to contact your doctor if:  ?? You need more help controlling your cholesterol.     Where can you learn more?    Go to [email or web address]   Enter [phone] in the search box to learn more about "High Cholesterol: After Your Visit."    ?? 2006-2012 [employer]. Care instructions adapted under license by [employer] (which disclaims liability or warranty for this information). This care instruction is  for use with your licensed healthcare professional. If you have questions about a medical condition or this instruction, always ask your healthcare professional. [employer] disclaims any warranty or liability for your use of this information.  Content Version: 9.4.94723; Last Revised: January 03, 2011          Hand Arthritis: Exercises  Your Care Instructions  Here are some examples of exercises for hand arthritis. Start each exercise slowly. Ease off the exercise if you start to have pain.  Your doctor or physical therapist will tell you when you can start these exercises and which ones will work best for you.  How to do the exercises  Tendon glides    In this exercise, the steps follow one another to a make a continuous movement.  1. With your affected hand, point your fingers and thumb straight up. Your wrist should be relaxed, following the line of your fingers and thumb.    2. Curl your fingers so that the top two joints in them are bent, and your fingers wrap down. Your fingertips should touch or be near the base of your fingers. Your fingers will look like a hook.   3. Make a fist by bending your knuckles. Your thumb can gently rest against your index (pointing) finger.   4. Unwind your fingers slightly so that your fingertips can touch the base of your palm. Your thumb can rest against your index finger.   5. Move back to your starting position, with your fingers and thumb pointing up.   6. Repeat the series of motions 8 to 12 times.   7. Switch hands and repeat steps 1 through 6, even if only one hand is sore.   Intrinsic flexion    1. Rest your affected hand on a table and bend the large joints where your fingers connect to your hand. Keep your thumb and the other joints in your fingers straight.   2. Slowly straighten your fingers. Your wrist should be relaxed, following the line of your fingers and thumb.   3. Move back to your starting position, with your hand bent.   4. Repeat 8 to 12 times.   5. Switch hands and repeat steps 1 through 4, even if only one hand is sore.   Finger extension    1. Place your affected hand flat on a table.   2. Lift and then lower one finger at a time off the table.   3. Repeat 8 to 12 times.   4. Switch hands and repeat steps 1 through 3, even if only one hand is sore.   MP extension    1. Place your good hand on a table, palm up. Put your affected hand on top of your good hand with your fingers wrapped around the thumb of your good hand like you are making a fist.   2. Slowly uncurl the joints of your affected hand where your fingers connect to your hand so that only the top two joints of your fingers are bent. Your fingers will look like a hook.   3. Move back to your starting position, with your fingers wrapped around your good thumb.   4. Repeat 8 to 12 times.   5. Switch hands and repeat steps 1 through 4, even if only one hand is sore.    PIP extension (with MP extension)    1. Place your good hand on a table, palm up. Put your affected hand on top of your  good hand, palm up.   2. Use the thumb and fingers of your good hand to grasp below the middle joint of one finger of your affected hand.   3. Straighten the last two joints of that finger.   4. Repeat 8 to 12 times.   5. Repeat steps 1 through 4 with each finger.   6. Switch hands and repeat steps 1 through 5, even if only one hand is sore.   DIP flexion    1. With your good hand, grasp one finger of your affected hand. Your thumb will be on the top side of your finger just below the joint that is closest to your fingernail.   2. Slowly bend your affected finger only at the joint closest to your fingernail.   3. Repeat 8 to 12 times.   4. Repeat steps 1 through 3 with each finger.   5. Switch hands and repeat steps 1 through 4, even if only one hand is sore.   Follow-up care is a key part of your treatment and safety. Be sure to make and go to all appointments, and call your doctor if you are having problems. It's also a good idea to know your test results and keep a list of the medicines you take.    Where can you learn more?    Go to [email or web address]   Enter [phone] in the search box to learn more about "Hand Arthritis: Exercises."    ?? 2006-2012 [employer]. Care instructions adapted under license by [employer] (which disclaims liability or warranty for this information). This care instruction is for use with your licensed healthcare professional. If you have questions about a medical condition or this instruction, always ask your healthcare professional. [employer] disclaims any warranty or liability for your use of this information.  Content Version: 9.4.94723; Last Revised: October 27, 2010          Thumb Arthritis: Exercises  Your Care Instructions   Here are some examples of exercises for thumb arthritis. Start each exercise slowly. Ease off the exercise if you start to have pain.  Your doctor or physical therapist will tell you when you can start these exercises and which ones will work best for you.  How to do the exercises  Thumb IP flexion    6. Place your forearm and hand on a table with your affected thumb pointing up.   7. With your other hand, hold your thumb steady just below the joint nearest your thumbnail.   8. Bend the tip of your thumb downward, then straighten it.   9. Repeat 8 to 12 times.   10. Switch hands and repeat steps 1 through 4, even if only one thumb is sore.   Thumb MP flexion    7. Place your forearm and hand on a table with your affected thumb pointing up.   8. With your other hand, hold the base of your thumb and palm steady.   9. Bend your thumb downward where it meets your palm, then straighten it.   10. Repeat 8 to 12 times.   11. Switch hands and repeat steps 1 through 4, even if only one thumb is sore.   Thumb opposition    6. With your affected hand, point your fingers and thumb straight up. Your wrist should be relaxed, following the line of your fingers and thumb.   7. Touch your affected thumb to each finger, one finger at a time. This will look  like an "okay" sign, but try to keep your other fingers straight and pointing upward as much as you can.   8. Repeat 8 to 12 times.   9. Switch hands and repeat steps 1 through 3, even if only one thumb is sore.   Follow-up care is a key part of your treatment and safety. Be sure to make and go to all appointments, and call your doctor if you are having problems. It's also a good idea to know your test results and keep a list of the medicines you take.    Where can you learn more?    Go to [email or web address]   Enter S402 in the search box to learn more about "Thumb Arthritis: Exercises."     ?? 2006-2012 [employer]. Care instructions adapted under license by [employer] (which disclaims liability or warranty for this information). This care instruction is for use with your licensed healthcare professional. If you have questions about a medical condition or this instruction, always ask your healthcare professional. [employer] disclaims any warranty or liability for your use of this information.  Content Version: 9.4.94723; Last Revised: October 27, 2010          Gout: After Your Visit  Your Care Instructions  Gout is a form of arthritis caused by a buildup of uric acid crystals in a joint. It causes sudden attacks of pain, swelling, redness, and stiffness, usually in one joint, especially the big toe.  Gout usually comes on without a cause. But it can be brought on by drinking alcohol (especially beer) or eating seafood and red meat. Taking certain medicines, such as diuretics or aspirin, also can bring on an attack of gout.  Taking your medicines as prescribed and following up with your doctor regularly can help you avoid gout attacks in the future.  Follow-up care is a key part of your treatment and safety. Be sure to make and go to all appointments, and call your doctor if you are having problems. It???s also a good idea to know your test results and keep a list of the medicines you take.  How can you care for yourself at home?  ?? If the joint is swollen, put ice or a cold pack on the area for 10 to 20 minutes at a time. Put a thin cloth between the ice and your skin.   ?? Prop up the sore limb on a pillow when you ice it or anytime you sit or lie down during the next 3 days. Try to keep it above the level of your heart. This will help reduce swelling.   ?? Rest sore joints. Avoid activities that put weight or strain on the joints for a few days. Take short rest breaks from your regular activities during the day.    ?? Take your medicines exactly as prescribed. Call your doctor if you think you are having a problem with your medicine.   ?? Take pain medicines exactly as directed.   ?? If the doctor gave you a prescription medicine for pain, take it as prescribed.   ?? If you are not taking a prescription pain medicine, ask your doctor if you can take an over-the-counter medicine.   ?? Eat less seafood and red meat.   ?? Check with your doctor before drinking alcohol.   ?? Losing weight, if you are overweight, may help reduce attacks of gout. But do not go on a "crash diet." Losing a lot of weight in a  short amount of time can cause a gout attack.   When should you call for help?  Call your doctor now or seek immediate medical care if:  ?? You have a fever.   ?? The joint is so painful you cannot use it.   ?? You have sudden, unexplained swelling, redness, warmth, or severe pain in one or more joints.   Watch closely for changes in your health, and be sure to contact your doctor if:  ?? You have joint pain.   ?? Your symptoms get worse or are not improving after 2 or 3 days.     Where can you learn more?    Go to [email or web address]   Enter E531 in the search box to learn more about "Gout: After Your Visit."    ?? 2006-2012 [employer]. Care instructions adapted under license by [employer] (which disclaims liability or warranty for this information). This care instruction is for use with your licensed healthcare professional. If you have questions about a medical condition or this instruction, always ask your healthcare professional. [employer] disclaims any warranty or liability for your use of this information.  Content Version: 9.4.94723; Last Revised: Mar 03, 2010          Purine-Restricted Diet: After Your Visit  Your Care Instructions   Purines are substances that are found in some foods. Your body turns purines into uric acid. High levels of uric acid can cause gout, which is a form of arthritis that causes pain and inflammation in joints.  You may be able to help control the amount of uric acid in your body by limiting high-purine foods in your diet.  Follow-up care is a key part of your treatment and safety. Be sure to make and go to all appointments, and call your doctor if you are having problems. It???s also a good idea to know your test results and keep a list of the medicines you take.  How can you care for yourself at home?  ?? Plan your diet around foods that are low in purines and are safe for you to eat. These foods include:   ?? Green vegetables and tomatoes.   ?? Fruits and fruit juices.   ?? Breads, rice, and cereals that are not whole-grain.   ?? Chocolate and cocoa.   ?? Coffee, tea, and carbonated beverages.   ?? Peanut butter and nuts.   ?? Cheese, milk, milk products, and eggs.   ?? Fat and oils, in moderation.   ?? Popcorn.   ?? Vinegar, olives, pickles, and relishes.   ?? Gelatin desserts.   ?? Cakes, cookies, and sweets, in moderation.   ?? You can eat certain foods that are medium-high in purines, but eat them only once in a while. These foods include:   ?? Asparagus, cauliflower, spinach, mushrooms, and green peas.   ?? Fish and seafood (other than very high-purine seafood).   ?? Oatmeal, wheat bran, and wheat germ.   ?? Limit very high-purine foods, including:   ?? Organ meats, such as liver, kidneys, sweetbreads, and brains.   ?? Meats, including bacon, beef, pork, and lamb.   ?? Game meats and any other meats in large amounts.   ?? Anchovies, sardines, herring, mackerel, and scallops.   ?? Gravy.   ?? Legumes, such as dried beans and dried peas.   ?? Beer.     Where can you learn more?    Go to [email or web address]   Enter F448 in the  search box to learn more about "Purine-Restricted Diet: After Your Visit."     ?? 2006-2012 [employer]. Care instructions adapted under license by [employer] (which disclaims liability or warranty for this information). This care instruction is for use with your licensed healthcare professional. If you have questions about a medical condition or this instruction, always ask your healthcare professional. [employer] disclaims any warranty or liability for your use of this information.  Content Version: 9.4.94723; Last Revised: Mar 01, 2010

## 2011-09-14 LAB — URINALYSIS W/ RFLX MICROSCOPIC
Bilirubin: NEGATIVE
Blood: NEGATIVE
Glucose: NEGATIVE
Ketone: NEGATIVE
Leukocyte Esterase: NEGATIVE
Nitrites: NEGATIVE
Protein: NEGATIVE
Specific Gravity: 1.021 (ref 1.005–1.030)
Urobilinogen: 0.2 mg/dL (ref 0.0–1.9)
pH (UA): 7 (ref 5.0–7.5)

## 2011-09-15 LAB — CULTURE, URINE
RESULT 1, 997131: NO GROWTH
Result 1: NO GROWTH

## 2011-09-15 LAB — URIC ACID: Uric acid: 5.6 mg/dL (ref 2.5–7.1)

## 2011-09-15 NOTE — Progress Notes (Signed)
Quick Note:    NO UTI. NO GOUT.  ______

## 2011-09-18 DIAGNOSIS — I6529 Occlusion and stenosis of unspecified carotid artery: Secondary | ICD-10-CM | POA: Insufficient documentation

## 2011-09-18 NOTE — Procedures (Signed)
[facility]  *** FINAL REPORT ***    Name: Kathryn [name], Kathryn [name]  MRN: [id number]  DOB: 01-21-1936  Visit: [id number]  Date: 18 Sep 2011    TYPE OF TEST: Cerebrovascular Duplex    REASON FOR TEST  Known carotid stenosis    Right Carotid:-               Proximal                  Mid                Distal  cm/s    Systolic  Diastolic   Systolic  Diastolic   Systolic  Diastolic  CCA:       93.0                                        66.0  Bulb:  ECA:       87.0  ICA:       52.0                  53.0                  58.0  ICA/CCA:    0.8    ICA Stenosis: Normal    Right Vertebral:-  Finding: Antegrade  Sys:       36.0  Dia:    Right Subclavian:    Left Carotid:-               Proximal                  Mid                Distal  cm/s    Systolic  Diastolic   Systolic  Diastolic   Systolic  Diastolic  CCA:       98.0                                        70.0  Bulb:  ECA:      120.0  ICA:       62.0                  71.0                  57.0  ICA/CCA:     0.9    ICA Stenosis: Mild 16-49%    Left Vertebral:-  Finding: Antegrade  Sys:       40.0  Dia:    Left Subclavian:    INTERPRETATION/FINDINGS  1. No significant stenosis of the right internal carotid artery.  2. Mild 16-49% stenosis in the left internal carotid artery.  3. No significant stenosis in the external carotid arteries  bilaterally.  4. Antegrade flow in both vertebral arteries.  Plaque Morphology:  1. Calcific plaque in the bulb and left ICA.    IMPRESSION/COMMENTS    I have personally reviewed the data relevant to the interpretation of  this  study.    TECHNOLOGIST:  Alfredia Client, RVT    PHYSICIAN:  Electronically signed by:  Clovis Pu. Katrinka Blazing, MD  09/18/2011 06:58 PM

## 2011-09-18 NOTE — Progress Notes (Signed)
Quick Note:    Pls notify pt of test results.  Start ASA 325 mg daily. Recheck in a year.  ______

## 2011-09-18 NOTE — Progress Notes (Signed)
[facility] Techn[facility] Preliminary Report:  Carotid Duplex Scan    Right:  No plaque noted in the right carotid system.  Right ICA velocities suggest 0% diameter reduction.  Right vertebral artery flow is antegrade.    Left:  Mild plaque noted in the left carotid system.  Left ICA velocities suggest 16-49% diameter reduction.  Left vertebral artery flow is antegrade.    Final report to follow.

## 2011-09-18 NOTE — Procedures (Signed)
[facility]  *** FINAL REPORT ***    Name: [name], Kathryn  MRN: [id number]  DOB: 29 Jan 1936  Visit: [id number]  Date: 18 Sep 2011    TYPE OF TEST: Cerebrovascular Duplex    REASON FOR TEST  Known carotid stenosis    Right Carotid:-               Proximal                  Mid                Distal  cm/s    Systolic  Diastolic   Systolic  Diastolic   Systolic  Diastolic  CCA:       93.0                                        66.0  Bulb:  ECA:       87.0  ICA:       52.0                  53.0                  58.0  ICA/CCA:    0.8    ICA Stenosis: Normal    Right Vertebral:-  Finding: Antegrade  Sys:       36.0  Dia:    Right Subclavian:    Left Carotid:-               Proximal                  Mid                Distal  cm/s    Systolic  Diastolic   Systolic  Diastolic   Systolic  Diastolic  CCA:       98.0                                        70.0  Bulb:  ECA:      120.0  ICA:       62.0                  71.0                  57.0  ICA/CCA:     0.9    ICA Stenosis: Mild 16-49%    Left Vertebral:-  Finding: Antegrade  Sys:       40.0  Dia:    Left Subclavian:    INTERPRETATION/FINDINGS  1. No significant stenosis of the right internal carotid artery.  2. Mild 16-49% stenosis in the left internal carotid artery.  3. No significant stenosis in the external carotid arteries  bilaterally.  4. Antegrade flow in both vertebral arteries.  Plaque Morphology:  1. Calcific plaque in the bulb and left ICA.    IMPRESSION/COMMENTS    I have personally reviewed the data relevant to the interpretation of  this  study.    TECHNOLOGIST:  Wesley Jaonna Word, RVT    PHYSICIAN:  Electronically signed by:  Broughton Eppinger A. Layden Caterino, MD  09/18/2011 06:58 PM

## 2011-09-21 NOTE — Telephone Encounter (Signed)
Pt was given urine results. She states she was given her carotid doppler report by Melany Guernsey, LPN and was told to take ASA 325 mg BID. Since she is on Celebrex, wonders if OK to discontinue Celebrex, as she had  read of potential long term side effects.  Please advise.

## 2011-09-21 NOTE — Progress Notes (Signed)
Quick Note:    Pt advised.  ______

## 2011-09-21 NOTE — Telephone Encounter (Signed)
Only take ASA 325 mg once daily.  Ok to take Celebrex at another time of day if absolutely needed.  Otherwise, Use Tylenol prn pain, fever, or headache.  Never exceed more than 3 grams daily, if you are over 73 years old.  Never exceed more than 4 grams daily, if you are under 23 years old.

## 2011-09-21 NOTE — Telephone Encounter (Signed)
Pt advised.

## 2011-09-28 ENCOUNTER — Encounter

## 2011-09-28 NOTE — Progress Notes (Signed)
Quick Note:    Ok to refer to rheumatologist if pain is severe  ______

## 2011-09-29 NOTE — Telephone Encounter (Signed)
Pt advised of hand xray. States it is feeling better with Indocin. Wonders how long to continue. Advised, per last OV note, take until pain resolves. Take the 325 mg ASA separate from the Indocin. She states will take Indocin in the AM and ASA in the PM. Advised, if pain comes back, call for additional advice. Does not really want to see rheumatologist, as is one more doctor to see.  States since she has received the results of labs and xray via phone, she will return in April or May for normal check.

## 2011-09-29 NOTE — Progress Notes (Signed)
Quick Note:    Pt advised.  ______

## 2011-10-03 NOTE — Telephone Encounter (Signed)
Per Dr. Adriana Simas, I called patient and told her she has an unread mychart message from Dr. Adriana Simas from 09/17/11.  I read the message to patient at her request and she voiced understanding.  She requested I mail a copy of the message to her home address, and I did this.

## 2011-10-09 MED ORDER — PRAVASTATIN 80 MG TAB
80 mg | ORAL_TABLET | ORAL | Status: DC
Start: 2011-10-09 — End: 2012-11-22

## 2011-11-22 DIAGNOSIS — I10 Essential (primary) hypertension: Secondary | ICD-10-CM | POA: Insufficient documentation

## 2011-11-22 DIAGNOSIS — E042 Nontoxic multinodular goiter: Secondary | ICD-10-CM | POA: Diagnosis not present

## 2011-11-22 DIAGNOSIS — E559 Vitamin D deficiency, unspecified: Secondary | ICD-10-CM | POA: Diagnosis not present

## 2011-11-22 DIAGNOSIS — E7800 Pure hypercholesterolemia, unspecified: Secondary | ICD-10-CM | POA: Diagnosis not present

## 2011-11-22 NOTE — Progress Notes (Signed)
Here for results. Feels fine. Is off Celebrex, thinks caused memory loss. Had carotid doppler 09/13/11.

## 2011-11-22 NOTE — Progress Notes (Signed)
HISTORY OF PRESENT ILLNESS  Kathryn [name] is a 76 y.o. female presents with Results, Hypertension, Finger Pain, Stress and Referral Follow Up    Agree with nurse note.  Subjective:  Kathryn [name] presents to our office for followup of her test results, blood pressure, finger pain, stress and referral followup.    She is a hypertensive patient with a history of high cholesterol, low vitamin D, multinodular goiter, arthritis of the hip and thumb pain.    She had blood work performed by our office in 2012 and would like to discuss those results.  She also had an ultrasound of her internal carotid artery performed and has been taking aspirin 325 mg since bedtime.  She denies headaches, dizziness, chest pain, chest tightness, shortness of breath, cough, wheeze, sneeze or new swelling anywhere.  She takes Diovan 160 mg daily for blood pressure and is tolerating that well.    She sees Dr. Lewayne Bunting for the multinodular goiter.  He performed an ultrasound in November and it is unchanged from years before.  She denies any difficulty swallowing or breathing.  She will continue to followup with him regarding this.    She has a history of arthritis in her hip and has seen Dr. Philomena Doheny for it in the past.  She has stopped using Celebrex because it started affecting her memory.  She felt like it slowed her thinking.  Since being off of it, she feels like she is at her baseline of sharpness again and memory recall.  She has been walking on her treadmill 30 minutes three days a week and believes that this has helped her hip.    She had right thumb pain.  It has improved since she has been taking a cactus supplement that she ordered online.      [facility]leh       She is pleased with her 2# weight loss.  She has made diet changes and is exercising 3 times weekly.    She is at peace with the death of her husband over the past year.  Her faith and trust in God has helped.    ROS    Review of Systems negative except as noted above in  HPI.    ALLERGIES:    Allergies   Allergen Reactions   ??? Other Rash     All cillins   ??? Penicillins Rash and Swelling   ??? Tetracycline Itching   ??? Bactrim (Sulfamethoprim Ds) Itching   ??? Celebrex (Celecoxib) Other (comments)     Memory disturbance, slowed thinking   ??? Crestor (Rosuvastatin) Other (comments)     Elevated CPK   ??? Lipitor (Atorvastatin) Other (comments)     Leg cramps   ??? Niaspan (Niacin) Other (comments)     Caused elevated BP   ??? Zocor (Simvastatin) Other (comments)     Leg cramps       CURRENT MEDICATIONS:  Current outpatient prescriptions:aspirin (ASPIRIN) 325 mg tablet, Take 325 mg by mouth daily.  , Disp: , Rfl: ;  pravastatin (PRAVACHOL) 80 mg tablet, TAKE 1 TABLET BY MOUTH EVERY DAY, Disp: 90 Tab, Rfl: 3;  DIOVAN 160 mg tablet, TAKE 1 TABLET BY MOUTH EVERY DAY FOR BLOOD PRESSURE, Disp: 90 Tab, Rfl: 2;  furosemide (LASIX) 20 mg tablet, TAKE 1 TABLET BY MOUTH DAILY, Disp: 90 Tab, Rfl: 1  calcium-cholecalciferol, d3, (CALCIUM 600 + D) 600-125 mg-unit Tab, Take  by mouth.  , Disp: , Rfl: ;  calcium 500 mg  Tab, Take  by mouth.  , Disp: , Rfl: ;  magnesium 250 mg Tab, Take  by mouth. Unsure of exact strength , Disp: , Rfl: ;  valACYclovir (VALTREX) 500 mg tablet, Take 1 Tab by mouth daily. For HSV, Disp: 90 Tab, Rfl: 3;  EVISTA 60 mg tablet, TAKE 1 TABLET EVERY DAY FOR BONES, Disp: 90 Tab, Rfl: 3  b complex vitamins tablet, Take 1 Tab by mouth daily.  , Disp: , Rfl: ;  ascorbic acid (VITAMIN C) 500 mg tablet, Take 500 mg by mouth nightly., Disp: , Rfl: ;  multivitamin (ONE A DAY) tablet, Take 1 Tab by mouth daily., Disp: , Rfl:     PAST MEDICAL HISTORY:    Past Medical History   Diagnosis Date   ??? Edema      left ankle   ??? DDD (degenerative disc disease) 10/1999     L 4-5 by MRI.  Dr. Shiela Mayer.  Dr. Robie Ridge, Pain   ??? Urinary incontinence, stress      mild.  Dr. Winnifred Friar   ??? Polyp of rectum      Dr. Reed Pandy   ??? Cystocele      Dr. Winnifred Friar   ??? Postmenopausal HRT (hormone  replacement therapy)      Hx  for 6 years.  Dr. Lavone Nian   ??? Morton's neuroma 2000     Left.  Dr. Gillis Santa   ??? Hematuria of undiagnosed cause 1980, 2009   ??? Single renal cyst 02/12/2008     Left.  0.9 cm   ??? Subchondral cysts 2009     Left hip femoral heads.  Dr. Adah Perl   ??? DJD (degenerative joint disease) 05/26/04     Dr. Shiela Mayer.   ??? Hypercholesterolemia    ??? Hypertension    ??? Kidney stone 12/2002     Dr. Gillis Ends   ??? Chickenpox childhood   ??? DJD (degenerative joint disease) of hip 2009     Dr. Kelby Aline.     ??? Rotator cuff tendinitis 12/20/04     Right.  Dr. Jen Mow   ??? Benign positional vertigo 01/07/04     Dr. Minette Headland   ??? SOB (shortness of breath) 07/11/04     Dr. Abigail Miyamoto.  Neg Stress Cardiolite.  EF 60 %.   ??? Unspecified vitamin D deficiency 2008   ??? Herpes zoster 02/26/07, 06/21/07, 08/27/07     Left buttocks then Right T2-3 dermatome   ??? Herpes simplex type 2 infection 10/05/08     Left buttocks   ??? Cataract 2002     Dr. Theodoro Grist   ??? Multinodular goiter 10/2009     Dr. Lewayne Bunting.  benign.   ??? Internal carotid artery stenosis 09/18/11     Left.  16-49%.         PAST SURGICAL HISTORY:    Past Surgical History   Procedure Date   ??? Hx cyst removal 1970s     Rt breast then Left.  benign.   ??? Hx bunionectomy 1970s, 2003     Bilateral.  Dr. Stephannie Li.   ??? Hx hysterectomy 1988     with BSO   ??? Pr foot/toes surgery proc unlisted 2000     Left Morton's Neuroma.  Dr. Stephannie Li   ??? Hx hip replacement 12/21/08 Right, 06/14/09 Left     Dr. Birdena Crandall      ??? Hx breast biopsy 1970's     bil.   ???  Hx cataract removal 2002, 2005     bilateral.  Dr. Theodoro Grist.   ??? Hx tonsillectomy    ??? Hx appendectomy    ??? Ir fine needle aspiration thyroid 10/2009     Right upper pole.  due to multinodular goiter.  Dr. Lewayne Bunting.  benign.       FAMILY HISTORY:    Family History   Problem Relation Age of Onset   ??? Diabetes Mother    ??? Heart Attack Mother 70   ??? Cancer Father      pancreatic   ??? Cancer Brother       bile duct cancer   ??? Heart Attack Sister 94   ??? Arthritis-osteo Sister    ??? Asthma Sister        SOCIAL HISTORY:    History     Social History   ??? Marital Status: MARRIED     Spouse Name: N/A     Number of Children: N/A   ??? Years of Education: N/A     Social History Main Topics   ??? Smoking status: Former Smoker -- 0.5 packs/day for 10 years     Types: Cigarettes     Quit date: 10/17/1987   ??? Smokeless tobacco: Never Used   ??? Alcohol Use: No   ??? Drug Use: No   ??? Sexually Active: Yes -- Female partner(s)     Birth Control/ Protection: None     Other Topics Concern   ??? Not on file     Social History Narrative   ??? No narrative on file       IMMUNIZATIONS:    Immunization History   Administered Date(s) Administered   ??? H1N1 Influenza Virus Vaccine 09/15/2008   ??? Influenza Vaccine Split 08/26/2009   ??? Influenza Vaccine Whole 06/16/2008   ??? Pneumococcal Vaccine 10/16/1998, 02/21/2011   ??? TD Vaccine 01/06/2003   ??? Zoster 05/25/2011         PHYSICAL EXAMINATION    Vital Signs  BP 120/65   Pulse 73   Temp 97 ??F (36.1 ??C)   Ht 5' 2.5" (1.588 m)   Wt 156 lb 9.6 oz (71.033 kg)   BMI 28.19 kg/m2   SpO2 97%    General appearance - Well nourished. Well appearing.  Well developed.  No acute distress. Overweight.   Head - Normocephalic.  Atraumatic.    Eyes - pupils equal and reactive. Extraocular eye movements intact. Sclera anicteric.  Mildly injected sclera.  Ears - Hearing is grossly normal bilaterally.      Nose - normal and patent.  No polyps noted.  No erythema.  No discharge.    Mouth - mucous membranes with adequate moisture.  Posterior pharynx normal with cobblestone appearance.  No erythema, white exudate or obstruction.  Neck - supple.  Midline trachea.  No carotid bruits noted bilaterally.  No thyromegaly noted.  Chest - clear to auscultation bilaterally anterriorly and posteriorly.  No wheezes.  No rales or rhonchi.  Breath sounds are symmetrical bilaterally.  Unlabored respirations.  Heart - normal rate.  Regular  rhythm.  Normal S1, S2.  No murmur noted.  No rubs, clicks or gallops noted.  Abdomen - soft and nondistended.  No masses or organomegaly.  No rebound, rigidity or guarding.  Bowel sounds normal x 4 quadrants.  No tenderness noted.  Neurological - awake, alert and oriented to person, place, and time and event.  Cranial nerves II through XII intact.  Clear speech.  Muscle strength is +5/5 x 4 extremities.  Sensation is intact to light touch bilaterally.  Steady gait.   Heme/Lymph - peripheral pulses normal x 4 extremities.  No peripheral edema is noted.    Musculoskeletal - Intact x 4 extremities.  Full ROM x 4 extremities.  No pain with movement.    Skin - no rashes, erythema, ecchymosis, lacerations, abrasions, suspicious moles noted  Psychological -   normal behavior, dress and thought processes.  Good insight. Good eye contact.  Normal affect.  Appropriate mood.  Normal speech.    DATA REVIEWED    Lab Results   Component Value Date/Time    Cholesterol, total 190 09/05/2011  8:00 AM    HDL Cholesterol 47 09/05/2011  8:00 AM    LDL, calculated 113 09/05/2011  8:00 AM    VLDL, calculated 30 09/05/2011  8:00 AM    Triglyceride 148 09/05/2011  8:00 AM    CHOL/HDL Ratio 3.0 02/26/2009 11:22 AM    LAB  Lab Results   Component Value Date/Time    Sodium 143 09/05/2011  8:00 AM    Potassium 3.6 09/05/2011  8:00 AM    Chloride 104 09/05/2011  8:00 AM    CO2 23 09/05/2011  8:00 AM    Anion gap 8 06/15/2009  4:30 AM    Glucose 88 09/05/2011  8:00 AM    BUN 15 09/05/2011  8:00 AM    Creatinine 0.76 09/05/2011  8:00 AM    BUN/Creatinine ratio 20 09/05/2011  8:00 AM    GFR est AA 92 03/01/2010  9:59 AM    GFR est non-AA 78 09/05/2011  8:00 AM    Calcium 9.8 09/05/2011  8:00 AM    Bilirubin, total 0.3 09/05/2011  8:00 AM    ALT 20 09/05/2011  8:00 AM    AST 24 09/05/2011  8:00 AM    Alk. phosphatase 64 09/05/2011  8:00 AM    Protein, total 6.8 09/05/2011  8:00 AM    Albumin 4.3 09/05/2011  8:00 AM    Globulin 3.2 06/02/2009 12:18 PM     A-G Ratio 1.7 09/05/2011  8:00 AM     Lab Results   Component Value Date/Time    TSH 1.900 09/05/2011  8:00 AM     Lab Results   Component Value Date/Time    WBC 7.5 06/02/2009 12:18 PM    HGB 8.7 06/16/2009  6:15 AM    HCT 26.9 06/16/2009  6:15 AM    PLATELET 281 06/02/2009 12:18 PM    MCV 89.0 06/02/2009 12:18 PM       ASSESSMENT and PLAN    1. Essential hypertension, benign      stable   2. Hypercholesterolemia     3. Weight loss, intentional      2# due to effort with exercise    4. Internal carotid artery stenosis  aspirin (ASPIRIN) 325 mg tablet   5. Unspecified vitamin D deficiency     6. Multinodular goiter      unchanged   7. Memory deficit      improved since off Celebrex   8. DJD (degenerative joint disease) of hip      stable with increased exercise   9. Thumb pain      Left.     cardiovascular risk and specific lipid/LDL goals reviewed  use of aspirin to prevent MI and TIA's discussed    Continue current medications and care  Most recent tests reviewed  Recheck  pertinent labs 08/2012  Addressed weight, diet and exercise with patient  Counseled patient on health concerns:  Stressors.  Weight loss goals and strategies.  Relevant handouts given and discussed with patient  Immunizations noted;   Praised patient for progress  Follow-up Disposition:  Return in about 6 months (around 05/21/2012) for stress, weight, bp.  Discussed the patient's BMI with her.  The BMI follow up plan is as follows: BMI is out of normal parameters and plan is as follows: I have counseled this patient on diet and exercise regimens.      More than 40 mins spent face to face with patient and more than 50% of this time spent in counseling and coordinating care.      There are no Patient Instructions on file for this visit.

## 2011-12-14 MED ORDER — FUROSEMIDE 20 MG TAB
20 mg | ORAL_TABLET | ORAL | Status: DC
Start: 2011-12-14 — End: 2012-06-09

## 2012-02-14 DIAGNOSIS — C4491 Basal cell carcinoma of skin, unspecified: Secondary | ICD-10-CM | POA: Insufficient documentation

## 2012-02-26 DIAGNOSIS — S0990XA Unspecified injury of head, initial encounter: Secondary | ICD-10-CM | POA: Diagnosis not present

## 2012-02-26 DIAGNOSIS — R51 Headache: Secondary | ICD-10-CM | POA: Diagnosis not present

## 2012-02-26 DIAGNOSIS — I10 Essential (primary) hypertension: Secondary | ICD-10-CM | POA: Diagnosis not present

## 2012-02-26 DIAGNOSIS — H538 Other visual disturbances: Secondary | ICD-10-CM | POA: Diagnosis not present

## 2012-02-26 DIAGNOSIS — Z87891 Personal history of nicotine dependence: Secondary | ICD-10-CM | POA: Diagnosis not present

## 2012-02-26 DIAGNOSIS — R509 Fever, unspecified: Secondary | ICD-10-CM | POA: Diagnosis not present

## 2012-02-26 DIAGNOSIS — R55 Syncope and collapse: Secondary | ICD-10-CM | POA: Diagnosis not present

## 2012-02-26 DIAGNOSIS — R42 Dizziness and giddiness: Secondary | ICD-10-CM | POA: Diagnosis not present

## 2012-02-26 LAB — CBC WITH AUTOMATED DIFF
ABS. BASOPHILS: 0 10*3/uL (ref 0.0–0.1)
ABS. EOSINOPHILS: 0.2 10*3/uL (ref 0.0–0.4)
ABS. LYMPHOCYTES: 1.8 10*3/uL (ref 0.8–3.5)
ABS. MONOCYTES: 0.4 10*3/uL (ref 0.0–1.0)
ABS. NEUTROPHILS: 4.8 10*3/uL (ref 1.8–8.0)
BASOPHILS: 0 % (ref 0–1)
EOSINOPHILS: 2 % (ref 0–7)
HCT: 41.1 % (ref 35.0–47.0)
HGB: 13.5 g/dL (ref 11.5–16.0)
LYMPHOCYTES: 25 % (ref 12–49)
MCH: 29.3 PG (ref 26.0–34.0)
MCHC: 32.8 g/dL (ref 30.0–36.5)
MCV: 89.2 FL (ref 80.0–99.0)
MONOCYTES: 6 % (ref 5–13)
NEUTROPHILS: 67 % (ref 32–75)
PLATELET: 232 10*3/uL (ref 150–400)
RBC: 4.61 M/uL (ref 3.80–5.20)
RDW: 13.3 % (ref 11.5–14.5)
WBC: 7.2 10*3/uL (ref 3.6–11.0)

## 2012-02-26 LAB — METABOLIC PANEL, COMPREHENSIVE
A-G Ratio: 1 — ABNORMAL LOW (ref 1.1–2.2)
ALT (SGPT): 28 U/L (ref 12–78)
AST (SGOT): 29 U/L (ref 15–37)
Albumin: 3.5 g/dL (ref 3.5–5.0)
Alk. phosphatase: 65 U/L (ref 50–136)
Anion gap: 8 mmol/L (ref 5–15)
BUN/Creatinine ratio: 40 — ABNORMAL HIGH (ref 12–20)
BUN: 21 MG/DL — ABNORMAL HIGH (ref 6–20)
Bilirubin, total: 0.3 MG/DL (ref 0.2–1.0)
CO2: 25 MMOL/L (ref 21–32)
Calcium: 9.5 MG/DL (ref 8.5–10.1)
Chloride: 110 MMOL/L — ABNORMAL HIGH (ref 97–108)
Creatinine: 0.52 MG/DL (ref 0.45–1.15)
GFR est AA: >60 mL/min/{1.73_m2} (ref >60)
GFR est non-AA: >60 mL/min/{1.73_m2} (ref >60)
Globulin: 3.4 g/dL (ref 2.0–4.0)
Glucose: 98 MG/DL (ref 65–100)
Potassium: 4.4 MMOL/L (ref 3.5–5.1)
Protein, total: 6.9 g/dL (ref 6.4–8.2)
Sodium: 143 MMOL/L (ref 136–145)

## 2012-02-26 LAB — CK W/ CKMB & INDEX
CK - MB: 1.1 NG/ML (ref 0.5–3.6)
CK-MB Index: 0.7 (ref 0–2.5)
CK: 159 U/L (ref 26–192)

## 2012-02-26 LAB — EKG, 12 LEAD, INITIAL
Atrial Rate: 67 {beats}/min
Calculated P Axis: 56 degrees
Calculated R Axis: 47 degrees
Calculated T Axis: 71 degrees
P-R Interval: 154 ms
Q-T Interval: 418 ms
QRS Duration: 78 ms
QTC Calculation (Bezet): 441 ms
Ventricular Rate: 67 {beats}/min

## 2012-02-26 LAB — TROPONIN I: Troponin-I, Qt.: <0.04 ng/mL (ref <0.05)

## 2012-02-26 MED ADMIN — naproxen (NAPROSYN) tablet 500 mg: ORAL | @ 18:00:00 | NDC 00904606961

## 2012-02-26 MED FILL — NAPROXEN 250 MG TAB: 250 mg | ORAL | Qty: 2

## 2012-02-26 MED FILL — ACETAMINOPHEN 325 MG TABLET: 325 mg | ORAL | Qty: 2

## 2012-02-26 NOTE — ED Notes (Signed)
Pt taken to stress echo.

## 2012-02-26 NOTE — ED Notes (Signed)
I have reviewed discharge instructions with the patient.  The patient verbalized understanding.

## 2012-02-26 NOTE — Progress Notes (Signed)
TTE Completed Results to follow

## 2012-02-26 NOTE — ED Provider Notes (Signed)
HPI Comments: 76 y.o.female who presents to the ED secondary to near syncope. Pt reports that she ate a large lunch yesterday, so she didn't eat dinner and still wasn't hungry this morning before going to the gym, so she only had 2 pieces of cheese. Pt reports that she walked on the treadmill for 20 minutes, and she was doing some sit ups when she got dizzy and the room started spinning. Pt reports that she laid on the ground for a few minutes, and then when she got up, she felt everything spin, and she fell but didn't ever lose consciousness. Pt reports that she hit the back of her head on the cement floor, and she fell on her right arm and leg. Pt denies having any chest pain, SOB, nausea, or diaphoresis when this episode occurred, and here in the ED, she is feeling much better. Pt does report that she got dizzy when she initially sat up in the bed, but the room isn't spinning now. Pt reports that her "vision isn't as sharp as it should be" right now, and she has had vertigo in the past, which felt similar to this. Pt denies any fever, tinnitus, hearing loss, neck pain, SOB, chest pain, palpitations, abdominal pain, nausea, vomiting, diarrhea, urinary symptoms, back pain, rash, or focal weakness or numbness. Pt reports that she hasn't been drinking enough fluids recently.    Pt has a history of edema, DDD, urinary incontinence, polyp of rectum, cystocele, postmenopausal HRT, Morton's neuroma, hematuria, single renal cyst, subchondral cysts, DJD, hypercholesterolemia, hypertension, kidney stone, chickenpox, benign positional vertigo, rotator cuff tendinitis, SOB, vitamin D deficiency, cataract, multinodular goiter, and internal carotid artery stenosis. Pt denies any history of heart problems or MI, but both her mother and sister died of an MI. Pt had a stress test several years ago.    Social: Pt doesn't smoke.  Note written by Wende Crease, Scribe, as dictated by Lanell Persons, MD 12:56 PM          The history  is provided by the patient.        Past Medical History   Diagnosis Date   ??? Edema      left ankle   ??? DDD (degenerative disc disease) 10/1999     L 4-5 by MRI.  Dr. Shiela Mayer.  Dr. Robie Ridge, Pain   ??? Urinary incontinence, stress      mild.  Dr. Winnifred Friar   ??? Polyp of rectum      Dr. Reed Pandy   ??? Cystocele      Dr. Winnifred Friar   ??? Postmenopausal HRT (hormone replacement therapy)      Hx  for 6 years.  Dr. Lavone Nian   ??? Morton's neuroma 2000     Left.  Dr. Gillis Santa   ??? Hematuria of undiagnosed cause 1980, 2009   ??? Single renal cyst 02/12/2008     Left.  0.9 cm   ??? Subchondral cysts 2009     Left hip femoral heads.  Dr. Adah Perl   ??? DJD (degenerative joint disease) 05/26/04     Dr. Shiela Mayer.   ??? Hypercholesterolemia    ??? Hypertension    ??? Kidney stone 12/2002     Dr. Gillis Ends   ??? Chickenpox childhood   ??? DJD (degenerative joint disease) of hip 2009     Dr. Kelby Aline.     ??? Rotator cuff tendinitis 12/20/04     Right.  Dr. Jen Mow   ???  Benign positional vertigo 01/07/04     Dr. Minette Headland   ??? SOB (shortness of breath) 07/11/04     Dr. Abigail Miyamoto.  Neg Stress Cardiolite.  EF 60 %.   ??? Unspecified vitamin D deficiency 2008   ??? Herpes zoster 02/26/07, 06/21/07, 08/27/07     Left buttocks then Right T2-3 dermatome   ??? Herpes simplex type 2 infection 10/05/08     Left buttocks   ??? Cataract 2002     Dr. Theodoro Grist   ??? Multinodular goiter 10/2009     Dr. Lewayne Bunting.  benign.   ??? Internal carotid artery stenosis 09/18/11     Left.  16-49%.          Past Surgical History   Procedure Date   ??? Hx cyst removal 1970s     Rt breast then Left.  benign.   ??? Hx bunionectomy 1970s, 2003     Bilateral.  Dr. Stephannie Li.   ??? Hx hysterectomy 1988     with BSO   ??? Pr foot/toes surgery proc unlisted 2000     Left Morton's Neuroma.  Dr. Stephannie Li   ??? Hx hip replacement 12/21/08 Right, 06/14/09 Left     Dr. Birdena Crandall      ??? Hx breast biopsy 1970's     bil.   ??? Hx cataract removal 2002, 2005     bilateral.   Dr. Theodoro Grist.   ??? Hx tonsillectomy    ??? Hx appendectomy    ??? Ir fine needle aspiration thyroid 10/2009     Right upper pole.  due to multinodular goiter.  Dr. Lewayne Bunting.  benign.         Family History   Problem Relation Age of Onset   ??? Diabetes Mother    ??? Heart Attack Mother 32   ??? Cancer Father      pancreatic   ??? Cancer Brother      bile duct cancer   ??? Heart Attack Sister 7   ??? Arthritis-osteo Sister    ??? Asthma Sister         History     Social History   ??? Marital Status: MARRIED     Spouse Name: N/A     Number of Children: N/A   ??? Years of Education: N/A     Occupational History   ??? Not on file.     Social History Main Topics   ??? Smoking status: Former Smoker -- 0.5 packs/day for 10 years     Types: Cigarettes     Quit date: 10/17/1987   ??? Smokeless tobacco: Never Used   ??? Alcohol Use: No   ??? Drug Use: No   ??? Sexually Active: Yes -- Female partner(s)     Birth Control/ Protection: None     Other Topics Concern   ??? Not on file     Social History Narrative   ??? No narrative on file                  ALLERGIES: Other; Penicillins; Tetracycline; Bactrim; Celebrex; Crestor; Lipitor; Niaspan; and Zocor      Review of Systems   Constitutional: Positive for fever.   HENT: Negative for hearing loss, neck pain and tinnitus.    Eyes: Positive for visual disturbance.   Respiratory: Negative for cough and shortness of breath.    Cardiovascular: Positive for syncope. Negative for chest pain and palpitations.   Gastrointestinal: Negative for nausea, vomiting, abdominal pain  and diarrhea.   Genitourinary: Negative for difficulty urinating.   Musculoskeletal: Negative for back pain.   Skin: Negative for rash.   Neurological: Positive for dizziness. Negative for weakness and numbness.   All other systems reviewed and are negative.        Filed Vitals:    02/26/12 1145   BP: 144/73   Pulse: 67   Temp: 97.5 ??F (36.4 ??C)   Resp: 18   Height: 5\' 2"  (1.575 m)   Weight: 68.04 kg (150 lb)   SpO2: 99%            Physical Exam   Nursing  note and vitals reviewed.  Constitutional: She is oriented to person, place, and time. She appears well-developed and well-nourished. No distress.   HENT:   Head: Normocephalic and atraumatic.   Eyes: Conjunctivae and EOM are normal. Pupils are equal, round, and reactive to light.   Neck: Normal range of motion.   Cardiovascular: Normal rate, regular rhythm and normal heart sounds.    No murmur heard.  Pulmonary/Chest: Effort normal and breath sounds normal. No respiratory distress. She has no wheezes. She has no rales. She exhibits no tenderness.   Abdominal: Soft. She exhibits no distension and no mass. There is no tenderness. There is no rebound and no guarding.   Musculoskeletal: Normal range of motion. She exhibits no edema and no tenderness.   Neurological: She is alert and oriented to person, place, and time. No cranial nerve deficit.   Skin: Skin is warm and dry. No rash noted. No erythema.   Psychiatric: She has a normal mood and affect. Her behavior is normal.        MDM    Procedures      ED EKG interpretation:  Rhythm: normal sinus rhythm; and regular . Rate (approx.): 67; Axis: normal; P wave: normal; QRS interval: normal ; ST/T wave: non-specific changes occasional PAC.  This EKG was interpreted by Mick Sell, MD,ED Provider.    CONSULT NOTE:  Lanell Persons, MD spoke to Dr. Dahlia Client concerning the patient. The patient's history, presentation, physical findings, and results were all discussed. She said that the pt is unable to have a stress test today, but she will call back after trying to set up a stress test for tomorrow. She said that the pt would be able to be discharged if her 4 hour troponin is negative.  Note written by Wende Crease, Scribe, as dictated by Lanell Persons, MD 2:31 PM    PROGRESS NOTE:  Pt is going to get a stress test.  Note written by Wende Crease, Scribe, as dictated by Lanell Persons, MD 3:02 PM    ED EKG interpretation:  Rhythm: normal sinus rhythm; and regular . Rate  (approx.): 67; Axis: normal; P wave: normal; QRS interval: normal ; ST/T wave: non-specific changes.  Occasional PVC's.  This EKG was interpreted by Mick Sell, MD,ED Provider.      Spoke to dr. Dahlia Client. Patient had stress test in hospitalist which was negative.

## 2012-02-26 NOTE — Progress Notes (Signed)
physical Therapy Emergency Department EVALUATION/DISCHARGE  Patient: Kathryn [name] (76 y.o. female)  Date: 02/26/2012  Primary Diagnosis: No admission diagnoses for hospital encounter.        Precautions:   Fall  ASSESSMENT :  Based on the objective data described below, the patient presents with baseline mobility after resting on stretcher for an extended period.  Initially when getting up to access bathroom, pt was dizzy with fuzzy vision.  Pt given soft drink and crackers and was generally steady but anxious when gotten up the second time at least one hour later.  Tinetti score indicative of low risk for falls.  Pt appropriate for discharge to home with follow up if dizziness returns.  Pt educated in falls risk and fear of falling phenomenon and issued home safety check list.      Further acute physical therapy is not indicated at this time.     PLAN :  Discharge Recommendations:     [x]    Home with family  []    Skilled nursing facility  []    Admission to hospital with rehab likely needed  []    Inpatient rehab referral  []    Outpatient physical therapy referral  []    Other:    Further Equipment Recommendations for Discharge:   []    Rolling walker with 5" wheels  []    Crutches   []    Cane   []    Wheelchair   [x]    Other: none    COMMUNICATION/EDUCATION:   Communication/Collaboration:  [x]    Fall prevention education was provided and the patient/caregiver indicated understanding.  [x]    Patient/family have participated as able and agree with findings and recommendations.  []    Patient is unable to participate in plan of care at this time.  Findings and recommendations were discussed with: MD physician and Registered Nurse       SUBJECTIVE:   Patient stated ???I was dizzy but I'm much better now.???    OBJECTIVE DATA SUMMARY:     Past Medical History   Diagnosis Date   ??? Edema      left ankle   ??? DDD (degenerative disc disease) 10/1999     L 4-5 by MRI.  Dr. Shiela Mayer.  Dr. Robie Ridge, Pain   ??? Urinary  incontinence, stress      mild.  Dr. Winnifred Friar   ??? Polyp of rectum      Dr. Reed Pandy   ??? Cystocele      Dr. Winnifred Friar   ??? Postmenopausal HRT (hormone replacement therapy)      Hx  for 6 years.  Dr. Lavone Nian   ??? Morton's neuroma 2000     Left.  Dr. Gillis Santa   ??? Hematuria of undiagnosed cause 1980, 2009   ??? Single renal cyst 02/12/2008     Left.  0.9 cm   ??? Subchondral cysts 2009     Left hip femoral heads.  Dr. Adah Perl   ??? DJD (degenerative joint disease) 05/26/04     Dr. Shiela Mayer.   ??? Hypercholesterolemia    ??? Hypertension    ??? Kidney stone 12/2002     Dr. Gillis Ends   ??? Chickenpox childhood   ??? DJD (degenerative joint disease) of hip 2009     Dr. Kelby Aline.     ??? Rotator cuff tendinitis 12/20/04     Right.  Dr. Jen Mow   ??? Benign positional vertigo 01/07/04     Dr. Minette Headland   ???  SOB (shortness of breath) 07/11/04     Dr. Abigail Miyamoto.  Neg Stress Cardiolite.  EF 60 %.   ??? Unspecified vitamin D deficiency 2008   ??? Herpes zoster 02/26/07, 06/21/07, 08/27/07     Left buttocks then Right T2-3 dermatome   ??? Herpes simplex type 2 infection 10/05/08     Left buttocks   ??? Cataract 2002     Dr. Theodoro Grist   ??? Multinodular goiter 10/2009     Dr. Lewayne Bunting.  benign.   ??? Internal carotid artery stenosis 09/18/11     Left.  16-49%.       Past Surgical History   Procedure Date   ??? Hx cyst removal 1970s     Rt breast then Left.  benign.   ??? Hx bunionectomy 1970s, 2003     Bilateral.  Dr. Stephannie Li.   ??? Hx hysterectomy 1988     with BSO   ??? Pr foot/toes surgery proc unlisted 2000     Left Morton's Neuroma.  Dr. Stephannie Li   ??? Hx hip replacement 12/21/08 Right, 06/14/09 Left     Dr. Birdena Crandall      ??? Hx breast biopsy 1970's     bil.   ??? Hx cataract removal 2002, 2005     bilateral.  Dr. Theodoro Grist.   ??? Hx tonsillectomy    ??? Hx appendectomy    ??? Ir fine needle aspiration thyroid 10/2009     Right upper pole.  due to multinodular goiter.  Dr. Lewayne Bunting.  benign.     Prior Level of Function/Home Situation:  independent, drove, worked out at fitness facility  Home Situation  Home Environment: Private residence  [place] to Enter: Yes  Hand Rails : Right  One/Two Story Residence: One story  Living Alone: Yes   Support Systems: Church / faith community  Patient Expects to be Discharged to:: Private residence  Current DME Used/Available at Home: Gilmer Mor, straight;Raised toilet seat  Tub or Shower Type: Arts development officer Behavior:  Neurologic State: Alert  Orientation Level: Oriented X4  Cognition: Appropriate decision making  Safety/Judgement: Awareness of environment  Skin:  intact  Strength:    Strength: Within functional limits                    Tone & Sensation:   Tone: Normal              Sensation: Intact               Range Of Motion:  AROM: Within functional limits           PROM: Within functional limits           Coordination:  Coordination: Within functional limits    Functional Mobility:  Bed Mobility:  Rolling: Independent  Supine to Sit: Independent  Sit to Supine: Independent  Scooting: Independent  Transfers:  Sit to Stand: Independent  Stand to Sit: Independent                       Balance:   Sitting: Intact  Standing: Intact (requested HHA but did not use for support)  Ambulation/Gait Training:              Gait Description (WDL):  (Generally within normal limits.  FOF impacts gait slightly)  Pain:  Pain Scale 1: Numeric (0 - 10)  Pain Intensity 1: 4  Pain Location 1: Head           Activity Tolerance:   Improving tolerance to activity as time passed.   Please refer to the flowsheet for vital signs taken during this treatment.  After treatment:   []          Patient left in no apparent distress sitting up in chair  [x]          Patient left in no apparent distress in bed  [x]          Call bell left within reach  [x]          Nursing notified  []          Caregiver present  []          Bed alarm activated        Thank you for this referral.  Octavio Graves. Shon Baton, PT   Time  Calculation: 42 mins

## 2012-02-26 NOTE — ED Notes (Signed)
Pt had syncopal/dizziness episode after being on treadmill for about 20 minutes. Has not worked out in 2 months. No LOC, did fall and hit back of head on floor. BG 91.

## 2012-02-26 NOTE — ED Notes (Signed)
Pt still in echo stress test. Will be discharged when pt received back to ED.

## 2012-02-27 NOTE — Procedures (Signed)
[facility]                                [address]                              [place], [place] [address]      Name:      Kathryn [name], Kathryn [name]             Service Date:    02/26/2012  DOB:       1936-03-06                   Ordered by:      Meredith Mody,   MD                                          Location:  Sex:       F                            Age:             76  Billing #: [id number]                 Date of Adm:     02/26/2012                            STRESS ECHOCARDIOGRAPHY REPORT    CLINICAL DIAGNOSIS/INDICATIONS:  Syncope.    MEDICATIONS: See H P    Baseline Tracing Supine: Normal sinus rhythm, nonspecific ST-T  abnormality.  Standing BP:  Sitting  BP:  Smoke: no  Hypertensive: yes  Diabetic: no  Cholesterol: yes    Stage        MPH      %GRADE     MIN       HR        BP        COMMENT    Preliminary                                71        168/80  Progressive  1.7      10         2:36      148       160/80  Multistage  Continuous  Exercise    Standing  Postexercise  Sitting                                              185/79  Postexercise    101     % of predicted max heart rate (Predicted max 146/min)  124=85%    - "Double Product" (max systolic pressure x max heart rate)  4.6     METS reached at maximum effort  2:36     Total exercise time    Please see Measurement Summary and Tabular Summary printouts on the chart  for detailed information.    INTERPRETATION:  Resting electrocardiogram shows  normal sinus rhythm with  nonspecific ST-T abnormality.  Appropriate heart rate response to exercise.  Normal resting blood pressure.  Appropriate blood pressure response to  exercise.  No chest pain.  Arrhythmias:  Rare PVCs.  ST changes:  0.5 mm  horizontal ST depression.  Moderately decreased functional capacity (20% to  30%).  Overall impression:  No ischemic ECG changes.    CONCLUSION:  No ischemic ECG changes.  Normal LV systolic function and wall  motion in  all segments at rest and stress.  Normal stress echo.          Kathryn Augusta, MD    cc:    Maye Hides, MD         Kathryn Augusta, MD    SSW/ac; D:  02/26/2012 ; T:  02/27/2012 03:47 P; DOC# [id number]; JOB#

## 2012-02-27 NOTE — Procedures (Signed)
[facility]                                [address]                              [place] [address]      Name:      [name], Kathryn H             Service Date:    02/26/2012  DOB:       12/02/1935                   Ordered by:      Christine Browning,   MD                                          Location:  Sex:       F                            Age:             76  Billing #: [id number]                 Date of Adm:     02/26/2012                            STRESS ECHOCARDIOGRAPHY REPORT    CLINICAL DIAGNOSIS/INDICATIONS:  Syncope.    MEDICATIONS: See H P    Baseline Tracing Supine: Normal sinus rhythm, nonspecific ST-T  abnormality.  Standing BP:  Sitting  BP:  Smoke: no  Hypertensive: yes  Diabetic: no  Cholesterol: yes    Stage        MPH      %GRADE     MIN       HR        BP        COMMENT    Preliminary                                71        168/80  Progressive  1.7      10         2:36      148       160/80  Multistage  Continuous  Exercise    Standing  Postexercise  Sitting                                              185/79  Postexercise    101     % of predicted max heart rate (Predicted max 146/min)  124=85%    - "Double Product" (max systolic pressure x max heart rate)  4.6     METS reached at maximum effort  2:36     Total exercise time    Please see Measurement Summary and Tabular Summary printouts on the chart  for detailed information.    INTERPRETATION:  Resting electrocardiogram shows   normal sinus rhythm with  nonspecific ST-T abnormality.  Appropriate heart rate response to exercise.  Normal resting blood pressure.  Appropriate blood pressure response to  exercise.  No chest pain.  Arrhythmias:  Rare PVCs.  ST changes:  0.5 mm  horizontal ST depression.  Moderately decreased functional capacity (20% to  30%).  Overall impression:  No ischemic ECG changes.    CONCLUSION:  No ischemic ECG changes.  Normal LV systolic function and wall  motion in  all segments at rest and stress.  Normal stress echo.          Nirel Babler S Toneshia Coello, MD    cc:    Christine J Browning, MD         Ruston Fedora S Rossana Molchan, MD    SSW/ac; D:  02/26/2012 ; T:  02/27/2012 03:47 P; DOC# [id number]; JOB#

## 2012-03-04 MED ORDER — EVISTA 60 MG TABLET
60 mg | ORAL_TABLET | ORAL | Status: DC
Start: 2012-03-04 — End: 2013-03-14

## 2012-03-19 DIAGNOSIS — C44711 Basal cell carcinoma of skin of unspecified lower limb, including hip: Secondary | ICD-10-CM | POA: Diagnosis not present

## 2012-03-19 DIAGNOSIS — D485 Neoplasm of uncertain behavior of skin: Secondary | ICD-10-CM | POA: Diagnosis not present

## 2012-04-11 DIAGNOSIS — C44711 Basal cell carcinoma of skin of unspecified lower limb, including hip: Secondary | ICD-10-CM | POA: Diagnosis not present

## 2012-04-11 DIAGNOSIS — L719 Rosacea, unspecified: Secondary | ICD-10-CM | POA: Diagnosis not present

## 2012-04-23 DIAGNOSIS — S81009A Unspecified open wound, unspecified knee, initial encounter: Secondary | ICD-10-CM | POA: Diagnosis not present

## 2012-04-23 DIAGNOSIS — S91009A Unspecified open wound, unspecified ankle, initial encounter: Secondary | ICD-10-CM | POA: Diagnosis not present

## 2012-04-23 DIAGNOSIS — A4901 Methicillin susceptible Staphylococcus aureus infection, unspecified site: Secondary | ICD-10-CM | POA: Diagnosis not present

## 2012-04-30 DIAGNOSIS — A4901 Methicillin susceptible Staphylococcus aureus infection, unspecified site: Secondary | ICD-10-CM | POA: Diagnosis not present

## 2012-05-08 DIAGNOSIS — I10 Essential (primary) hypertension: Secondary | ICD-10-CM | POA: Diagnosis not present

## 2012-05-08 DIAGNOSIS — J309 Allergic rhinitis, unspecified: Secondary | ICD-10-CM | POA: Diagnosis not present

## 2012-05-08 DIAGNOSIS — R42 Dizziness and giddiness: Secondary | ICD-10-CM | POA: Diagnosis not present

## 2012-05-08 DIAGNOSIS — Z0000 Encounter for general adult medical examination without abnormal findings: Secondary | ICD-10-CM | POA: Diagnosis not present

## 2012-05-08 MED ORDER — CICLESONIDE 37 MCG/ACTUATION NASAL HFA INHALER
37 mcg/actuation | Freq: Every day | NASAL | Status: DC
Start: 2012-05-08 — End: 2012-09-04

## 2012-05-08 MED ORDER — VALSARTAN 160 MG TAB
160 mg | ORAL_TABLET | Freq: Every day | ORAL | Status: DC
Start: 2012-05-08 — End: 2012-10-25

## 2012-05-08 MED ORDER — VALACYCLOVIR 500 MG TAB
500 mg | ORAL_TABLET | Freq: Every day | ORAL | Status: DC
Start: 2012-05-08 — End: 2013-03-14

## 2012-05-08 NOTE — Progress Notes (Signed)
Chief Complaint   Patient presents with   ??? Complete Physical   ??? Ear Pain     Pt states c/o left ear pain

## 2012-05-08 NOTE — Progress Notes (Signed)
HISTORY OF PRESENT ILLNESS  Kathryn [name] is a 76 y.o. female here for an annual physical exam    Agree with nurse note.    Subjective:  Kathryn [name] presents to our office for her yearly physical exam.  She states her insurance company contacted her and told her she needed a Medicare physical, so she is here today.  She has some additional concerns.    She gets her Pap's and mammograms through her GYN.  She has not had a colonoscopy exam performed.  She will need a referral today.      She has a history of high blood pressure, herpes simples type II, high cholesterol, low vitamin D, multinodular goiter.  She will return fasting to have blood work performed.  Her last blood work was performed by the emergency room on Feb 26, 2012.  At that time, she had experienced a near syncope episode.  Reportedly, she almost passed out while exercising at [facility].  She had not had dinner the night before and had only eaten two pieces of cheese for breakfast before working out at the facility.  She started feeling lightheaded and then almost passed out.  She had everything ordered while in the emergency room.  Her lab work was normal.  Her CAT scan of her head was normal.  She had hit the back of her head on the floor when she fell.  Chest x-ray was normal.  EKG showed occasional PVC's but her stress test was normal.  Dr. Meredith Mody assisted with her cardiac evaluation.  The patient has had no recurrence of this episode since her emergency room visit on Feb 26, 2012.  She was advised to followup with our office within a week.    She has seen Dr. Hardie Lora for her skin examination and has been diagnosed with left lower leg and left foot basal cell carcinoma.  She has had these areas biopsied.      She would like Valtrex and Diovan refilled today.  She is tolerating these medications well.  She denies any headaches, dizziness, chest pain, chest tightness, shortness of breath, cough, wheeze, sneeze or new swelling  anywhere.      She admits that she is continuing to heal from the death of her husband two years ago but it is getting a little easier.  She has had the support of her church family here and at this time has decided not to rush to move to [place] to be with her family.  She denies any suicidal ideation or attempt.    Today she has some nasal congestion with postnasal drainage and bilateral ear pain as well as lightheaded sensation off and on.  She denies fever, chills or other associated symptoms.      She is 76 y.o. and does not recall ever having a bone density test performed.    MedDATA/leh        ROS    Review of Systems is negative except as mentioned above in HPI.    ALLERGIES:    Allergies   Allergen Reactions   ??? Other Rash     All cillins   ??? Penicillins Rash and Swelling   ??? Tetracycline Itching   ??? Bactrim (Sulfamethoprim Ds) Itching   ??? Celebrex (Celecoxib) Other (comments)     Memory disturbance, slowed thinking   ??? Crestor (Rosuvastatin) Other (comments)     Elevated CPK   ??? Lipitor (Atorvastatin) Other (comments)  Leg cramps   ??? Niaspan (Niacin) Other (comments)     Caused elevated BP   ??? Zocor (Simvastatin) Other (comments)     Leg cramps       CURRENT MEDICATIONS:  Current outpatient prescriptions:valsartan (DIOVAN) 160 mg tablet, Take 1 Tab by mouth daily. Indications: HYPERTENSION, Disp: 90 Tab, Rfl: 2;  valACYclovir (VALTREX) 500 mg tablet, Take 1 Tab by mouth daily. For HSV, Disp: 90 Tab, Rfl: 3;  ciclesonide (ZETONNA) 37 mcg/actuation HFAA, 1 Dose by Nasal route daily., Disp: 1 Bottle, Rfl: 0;  EVISTA 60 mg tablet, TAKE 1 TABLET EVERY DAY FOR BONES, Disp: 90 Tab, Rfl: 3  furosemide (LASIX) 20 mg tablet, TAKE 1 TABLET BY MOUTH DAILY, Disp: 90 Tab, Rfl: 1;  aspirin (ASPIRIN) 325 mg tablet, Take 325 mg by mouth daily.  , Disp: , Rfl: ;  pravastatin (PRAVACHOL) 80 mg tablet, TAKE 1 TABLET BY MOUTH EVERY DAY, Disp: 90 Tab, Rfl: 3;  calcium-cholecalciferol, d3, (CALCIUM 600 + D) 600-125  mg-unit Tab, Take  by mouth.  , Disp: , Rfl: ;  calcium 500 mg Tab, Take  by mouth.  , Disp: , Rfl:   magnesium 250 mg Tab, Take  by mouth. Unsure of exact strength , Disp: , Rfl: ;  b complex vitamins tablet, Take 1 Tab by mouth daily.  , Disp: , Rfl: ;  ascorbic acid (VITAMIN C) 500 mg tablet, Take 500 mg by mouth nightly., Disp: , Rfl: ;  multivitamin (ONE A DAY) tablet, Take 1 Tab by mouth daily., Disp: , Rfl: ;  DISCONTD: valACYclovir (VALTREX) 500 mg tablet, Take 1 Tab by mouth daily. For HSV, Disp: 90 Tab, Rfl: 3    PAST MEDICAL HISTORY:    Past Medical History   Diagnosis Date   ??? Edema      left ankle   ??? DDD (degenerative disc disease) 10/1999     L 4-5 by MRI.  Dr. Shiela Mayer.  Dr. Robie Ridge, Pain   ??? Urinary incontinence, stress      mild.  Dr. Winnifred Friar   ??? Polyp of rectum      Dr. Reed Pandy   ??? Cystocele      Dr. Winnifred Friar   ??? Postmenopausal HRT (hormone replacement therapy)      Hx  for 6 years.  Dr. Lavone Nian   ??? Morton's neuroma 2000     Left.  Dr. Gillis Santa   ??? Hematuria of undiagnosed cause 1980, 2009   ??? Single renal cyst 02/12/2008     Left.  0.9 cm   ??? Subchondral cysts 2009     Left hip femoral heads.  Dr. Adah Perl   ??? DJD (degenerative joint disease) 05/26/04     Dr. Shiela Mayer.   ??? Hypercholesterolemia    ??? Hypertension    ??? Kidney stone 12/2002     Dr. Gillis Ends   ??? Chickenpox childhood   ??? DJD (degenerative joint disease) of hip 2009     Dr. Kelby Aline.     ??? Rotator cuff tendinitis 12/20/04     Right.  Dr. Jen Mow   ??? Benign positional vertigo 01/07/04     Dr. Minette Headland   ??? SOB (shortness of breath) 07/11/04     Dr. Abigail Miyamoto.  Neg Stress Cardiolite.  EF 60 %.   ??? Unspecified vitamin D deficiency 2008   ??? Herpes zoster 02/26/07, 06/21/07, 08/27/07     Left buttocks then Right T2-3 dermatome   ???  Herpes simplex type 2 infection 10/05/08     Left buttocks   ??? Cataract 2002     Dr. Theodoro Grist   ??? Multinodular goiter 10/2009     Dr. Lewayne Bunting.  benign.   ???  Internal carotid artery stenosis 09/18/11     Left.  16-49%.     ??? Basal cell cancer 02/2012     left lower leg.  Left foot.  Dr. Tilden Fossa.   ??? Near syncope 02/26/12     Negative Stress Test.  due to low blood sugar.  Dr. Meredith Mody       PAST SURGICAL HISTORY:    Past Surgical History   Procedure Date   ??? Hx cyst removal 1970s     Rt breast then Left.  benign.   ??? Hx bunionectomy 1970s, 2003     Bilateral.  Dr. Stephannie Li.   ??? Hx hysterectomy 1988     with BSO   ??? Pr foot/toes surgery proc unlisted 2000     Left Morton's Neuroma.  Dr. Stephannie Li   ??? Hx hip replacement 12/21/08 Right, 06/14/09 Left     Dr. Birdena Crandall      ??? Hx breast biopsy 1970's     bil.   ??? Hx cataract removal 2002, 2005     bilateral.  Dr. Theodoro Grist.   ??? Hx tonsillectomy    ??? Hx appendectomy    ??? Ir fine needle aspiration thyroid 10/2009     Right upper pole.  due to multinodular goiter.  Dr. Lewayne Bunting.  benign.   ??? Hx malignant skin lesion excision 04/23/2012     left lower leg       FAMILY HISTORY:    Family History   Problem Relation Age of Onset   ??? Diabetes Mother    ??? Heart Attack Mother 42   ??? Cancer Father      pancreatic   ??? Cancer Brother      bile duct cancer   ??? Heart Attack Sister 44   ??? Arthritis-osteo Sister    ??? Asthma Sister        SOCIAL HISTORY:    History     Social History   ??? Marital Status: MARRIED     Spouse Name: N/A     Number of Children: N/A   ??? Years of Education: N/A     Social History Main Topics   ??? Smoking status: Former Smoker -- 0.5 packs/day for 10 years     Types: Cigarettes     Quit date: 10/17/1987   ??? Smokeless tobacco: Never Used   ??? Alcohol Use: No   ??? Drug Use: No   ??? Sexually Active: Yes -- Female partner(s)     Birth Control/ Protection: None     Other Topics Concern   ??? Not on file     Social History Narrative   ??? No narrative on file       IMMUNIZATIONS:    Immunization History   Administered Date(s) Administered   ??? H1N1 Influenza Virus Vaccine 09/15/2008   ??? Influenza Vaccine Split  08/26/2009   ??? Influenza Vaccine Whole 06/16/2008   ??? Pneumococcal Vaccine 10/16/1998, 02/21/2011   ??? TD Vaccine 01/06/2003   ??? Zoster 05/25/2011         PHYSICAL EXAMINATION    Vital signs   BP 120/58   Pulse 60   Temp(Src) 96.1 ??F (35.6 ??C) (Oral)   Ht 5' 1.81" (1.57 m)  Wt 152 lb (68.947 kg)   BMI 27.97 kg/m2   SpO2 99%     General appearance - Well nourished. Well appearing.  Well developed.  No acute distress. Overweight.   Head - Normocephalic.  Atraumatic.  Non tender sinuses x 4.  Eyes - pupils equal and reactive, extraocular eye movements intact, sclera anicteric.  Mildly injected sclera.  Ears - Hearing is grossly normal bilaterally.  External ear canals occluded but normal without evidence of blood or swelling.     Nose - normal and patent. No erythema or turbinate edema. No discharge.  No polyps.   Mouth - mucous membranes with adequate moisture.   Posterior pharynx normal without lesions, white exudate, or obstruction.  Neck - supple.  Midline trachea.  No carotid bruits are noted.  No thyromegaly noted.  Neck is supple without rigidity.  Chest - clear to auscultation bilaterally anterriorly and posteriorly.  No wheezes, rales or rhonchi.  Breath sounds are symmetrical bilaterally.  Unlabored respirations.  Heart - normal rate.  Regular rhythm.  Normal S1, S2.   No murmurs.  No rubs, clicks or gallops noted.  Abdomen - soft and nondistended.  No masses or organomegaly.  No rebound, rigidity or guarding.  Bowel sounds normal x 4 quadrants.  No tenderness noted.  Back exam - normal range of motion.  No pain on palpation of the spinous processes in the cervical, thoracic, lumbar, sacral regions.  No CVA tenderness.     Neurological - awake, alert and oriented to person, place, and time and event.  Cranial nerves II through XII intact.   No focal findings.    Clear speech.  Muscle strength is +5/5 x 4 extremities.  Sensation is intact to light touch bilaterally.  Steady gait.    Musculoskeletal - Intact x  4 extremities.  Full ROM x 4 extremities.  No pain with movement.  No pain on palpation of the bilateral shoulders, elbows, wrists, hands.  No tenderness in the pelvis, pubic bone, bilateral hips, knees, ankles.  No obvious deformity   Heme/Lymph - peripheral pulses normal x 4 extremities.  No peripheral edema is noted.  No cervical adenopathy noted.   Skin - no rashes, erythema, ecchymosis, lacerations, abrasions, suspicious moles  Psychological -   normal behavior, dress and thought processes.  Good insight. Good eye contact.  Normal affect.  Appropriate mood.  Normal speech.    DATA REVIEWED    Results for orders placed during the hospital encounter of 02/26/12   EKG, 12 LEAD, INITIAL       Component Value Range    Ventricular Rate 67      Atrial Rate 67      P-R Interval 154      QRS Duration 78      Q-T Interval 418      QTC Calculation (Bezet) 441      Calculated P Axis 56      Calculated R Axis 47      Calculated T Axis 71      Diagnosis        Value: Sinus rhythm with occasional premature ventricular complexes and premature       atrial complexes      Confirmed by Alleen Borne [phone]) on 02/26/2012 4:48:25 PM   CBC WITH AUTOMATED DIFF       Component Value Range    WBC 7.2  3.6 - 11.0 K/uL    RBC 4.61  3.80 - 5.20 M/uL  HGB 13.5  11.5 - 16.0 g/dL    HCT 16.1  09.6 - 04.5 %    MCV 89.2  80.0 - 99.0 FL    MCH 29.3  26.0 - 34.0 PG    MCHC 32.8  30.0 - 36.5 g/dL    RDW 40.9  81.1 - 91.4 %    PLATELET 232  150 - 400 K/uL    NEUTROPHILS 67  32 - 75 %    LYMPHOCYTES 25  12 - 49 %    MONOCYTES 6  5 - 13 %    EOSINOPHILS 2  0 - 7 %    BASOPHILS 0  0 - 1 %    ABS. NEUTROPHILS 4.8  1.8 - 8.0 K/UL    ABS. LYMPHOCYTES 1.8  0.8 - 3.5 K/UL    ABS. MONOCYTES 0.4  0.0 - 1.0 K/UL    ABS. EOSINOPHILS 0.2  0.0 - 0.4 K/UL    ABS. BASOPHILS 0.0  0.0 - 0.1 K/UL   METABOLIC PANEL, COMPREHENSIVE       Component Value Range    Sodium 143  136 - 145 MMOL/L    Potassium 4.4  3.5 - 5.1 MMOL/L    Chloride 110 (*) 97 - 108 MMOL/L    CO2 25   21 - 32 MMOL/L    Anion gap 8  5 - 15 mmol/L    Glucose 98  65 - 100 MG/DL    BUN 21 (*) 6 - 20 MG/DL    Creatinine 7.82  9.56 - 1.15 MG/DL    BUN/Creatinine ratio 40 (*) 12 - 20      GFR est-AA >60  >60 ml/min/1.67m2    GFR est non-AA >60  >60 ml/min/1.37m2    Calcium 9.5  8.5 - 10.1 MG/DL    Bilirubin, total 0.3  0.2 - 1.0 MG/DL    ALT 28  12 - 78 U/L    AST 29  15 - 37 U/L    Alk. phosphatase 65  50 - 136 U/L    Protein, total 6.9  6.4 - 8.2 g/dL    Albumin 3.5  3.5 - 5.0 g/dL    Globulin 3.4  2.0 - 4.0 g/dL    A-G Ratio 1.0 (*) 1.1 - 2.2     CK W/ CKMB & INDEX       Component Value Range    CK - MB 1.1  0.5 - 3.6 NG/ML    CK-MB Index 0.7  0 - 2.5      CK 159  26 - 192 U/L   TROPONIN I       Component Value Range    Troponin-I, Qt. <0.04  <0.05 ng/mL       ASSESSMENT and PLAN    1. Preventative health care  URINALYSIS W/ RFLX MICROSCOPIC, LIPID PANEL, METABOLIC PANEL, COMPREHENSIVE, TSH, 3RD GENERATION, NMR LIPOPROFILE, HEMOGLOBIN A1C, CBC W/O DIFF   2. Essential hypertension, benign  valsartan (DIOVAN) 160 mg tablet, URINALYSIS W/ RFLX MICROSCOPIC, LIPID PANEL, METABOLIC PANEL, COMPREHENSIVE, TSH, 3RD GENERATION, NMR LIPOPROFILE    stable   3. Dizziness - light-headed  METABOLIC PANEL, COMPREHENSIVE, CBC W/O DIFF    due to ETD vs other   4. Breast cancer screening     5. Post-menopause  DEXA BONE DENSITY STUDY AXIAL   6. Allergic rhinitis, cause unspecified  ciclesonide (ZETONNA) 37 mcg/actuation HFAA   7. Bilateral impacted cerumen     8. Herpes simplex type 2 infection  valACYclovir (VALTREX) 500 mg tablet   9. Hypercholesterolemia  LIPID PANEL, METABOLIC PANEL, COMPREHENSIVE, NMR LIPOPROFILE   10. Unspecified vitamin D deficiency  VITAMIN D, 25 HYDROXY   11. Multinodular goiter     12. Near syncope      due to low blood sugar   13. Basal cell cancer      Left leg and foot   14. Colon cancer screening  REFERRAL TO GASTROENTEROLOGY     cardiovascular risk and specific lipid/LDL goals reviewed  use of aspirin to  prevent MI and TIA's discussed    See ophthalmologist every 2 to 3 years unless otherwise directed.  See dentist every 6 months.  See dermatologist for annual check.    Prescriptions written and sent to pharmacist or given to patient.  Continue current medications.  Start [place] daily.  Referrals given.  Keep appointments with specialists.  Discussed recent test results and goals with patient.  Recheck pertinent labs when fasting.  Recent PAP and mammograms reviewed.  Recheck as instructed by specialists.  Immunizations are noted.  Advise flu vaccine between the months September to December.  Advised balanced diet and exercise.  Proper rest.  Increase water and fiber. Avoid tobacco.  Decrease alcohol and caffeine. Decrease carbohydrates, increase green leafy vegetables and protein.  Increase water intake.  Eat 3-5 small meals daily.  Increase physical activity.  Relevant handouts provided and discussed with pt.  Counselled pt on:  Patient health concerns.  Stressors.  Grief.  Discussed the patient's BMI with her.  The BMI follow up plan is as follows: BMI is out of normal parameters and plan is as follows: I have counseled this patient on diet and exercise regimens.    More than 40 minutes spent face to face with patient.  More than 50% of this time was spent in counseling and coordination of care today.    Follow-up Disposition:  Return in about 4 months (around 09/08/2012) for cholesterol, bp.

## 2012-05-08 NOTE — Patient Instructions (Signed)
Allergies: After Your Visit  Your Care Instructions  Allergies occur when your body's defense system (immune system) overreacts to certain substances. The immune system treats a harmless substance as if it were a harmful germ or virus. Many things can cause this overreaction, including pollens, medicine, food, dust, animal dander, and mold.  Allergies can be mild or severe. Mild allergies can be managed with home treatment. But medicine may be needed to prevent problems.  Managing your allergies is an important part of staying healthy. Your doctor may suggest that you have allergy testing to help find out what is causing your allergies. When you know what things trigger your symptoms, you can avoid them. This can prevent allergy symptoms and other health problems.  For severe allergies that cause reactions that affect your whole body (anaphylactic reactions), your doctor may prescribe a shot of epinephrine (such as EpiPen) to carry with you in case you have a severe reaction. Learn how to give yourself the shot and keep it with you at all times. Make sure it is not expired.  Follow-up care is a key part of your treatment and safety. Be sure to make and go to all appointments, and call your doctor if you are having problems. It's also a good idea to know your test results and keep a list of the medicines you take.  How can you care for yourself at home?  ?? If you have been told by your doctor that dust or dust mites are causing your allergy, decrease the dust around your bed:   ?? Wash sheets, pillowcases, and other bedding in hot water every week.   ?? Use dust-proof covers for pillows, duvets, and mattresses. Avoid plastic covers because they tear easily and do not "breathe." Wash as instructed on the label.   ?? Do not use any blankets and pillows that you do not need.   ?? Use blankets that you can wash in your washing machine.   ?? Consider removing drapes and carpets, which attract and hold dust, from your  bedroom.   ?? If you are allergic to house dust and mites, do not use home humidifiers. Your doctor can suggest ways you can control dust and mites.   ?? Look for signs of cockroaches. Cockroaches cause allergic reactions. Use cockroach baits to get rid of them. Then, clean your home well. Cockroaches like areas where grocery bags, newspapers, empty bottles, or cardboard boxes are stored. Do not keep these inside your home, and keep trash and food containers sealed. Seal off any spots where cockroaches might enter your home.   ?? If you are allergic to mold, get rid of furniture, rugs, and drapes that smell musty. Check for mold in the bathroom.   ?? If you are allergic to outdoor pollen or mold spores, use air-conditioning. Change or clean all filters every month. Keep windows closed.   ?? If you are allergic to pollen, stay inside when pollen counts are high. Use a vacuum cleaner with a HEPA filter or a double-thickness filter at least two times each week.   ?? Stay inside when air pollution is bad. Avoid paint fumes, perfumes, and other strong odors.   ?? Avoid conditions that make your allergies worse. Stay away from smoke. Do not smoke or let anyone else smoke in your house. Do not use fireplaces or wood-burning stoves.   ?? If you are allergic to your pets, change the air filter in your furnace every month. Use high-efficiency filters.   ??   If you are allergic to pet dander, keep pets outside or out of your bedroom. Old carpet and cloth furniture can hold a lot of animal dander. You may need to replace them.   When should you call for help?  Call 911 anytime you think you may need emergency care. For example, call if:  ?? You have severe trouble breathing.   ?? You passed out (lost consciousness).   Note: After calling 911, use the allergy kit prescribed by your doctor if you have one.  Watch closely for changes in your health, and be sure to contact your doctor if:  ?? You need help controlling your allergies.   ?? You  have questions about allergy testing.   ?? You do not get better as expected.     Where can you learn more?    Go to [email or web address]   Enter W171 in the search box to learn more about "Allergies: After Your Visit."    ?? 2006-2013 [employer]. Care instructions adapted under license by [employer] (which disclaims liability or warranty for this information). This care instruction is for use with your licensed healthcare professional. If you have questions about a medical condition or this instruction, always ask your healthcare professional. [employer] disclaims any warranty or liability for your use of this information.  Content Version: 9.7.130178; Last Revised: December 23, 2010          Eustachian Tube Problems: After Your Visit  Your Care Instructions  The eustachian (say "you-STAY-shee-un") tubes run between the inside of the ears and the throat. They keep air pressure stable in the ears. If your eustachian tubes become blocked, the air pressure in your ears changes. The fluids from a cold can clog eustachian tubes, causing pain in the ears. A quick change in air pressure can cause eustachian tubes to close up. This might happen when an airplane changes altitude or when a scuba diver goes up or down underwater.  Eustachian tube problems often clear up on their own or after antibiotic treatment. If your tubes continue to be blocked, you may need surgery.  Follow-up care is a key part of your treatment and safety. Be sure to make and go to all appointments, and call your doctor if you are having problems. It???s also a good idea to know your test results and keep a list of the medicines you take.  How can you care for yourself at home?  ?? To ease ear pain, apply a warm washcloth or a heating pad set on low. There may be some drainage from the ear when the heat melts earwax. Put a cloth between the heat source and your skin.   ?? If your doctor prescribed antibiotics, take  them as directed. Do not stop taking them just because you feel better. You need to take the full course of antibiotics.   ?? Your doctor may recommend over-the-counter medicine. Oral or nasal decongestants may relieve ear pain. Avoid decongestants that are combined with antihistamines, which tend to cause more blockage. But if allergies seem to be the problem, your doctor may recommend a combination. Before you use cough and cold medicines, check the label. These medicines may not be safe for young children or for people with certain health problems.   When should you call for help?  Call your doctor now or seek immediate medical care if:  ?? You develop sudden, complete hearing loss.   ?? You have severe pain or feel dizzy.   ??  You have new or increasing pus or blood draining from your ear.   ?? You have redness, swelling, or pain around or behind the ear.   Watch closely for changes in your health, and be sure to contact your doctor if:  ?? You do not get better after 2 weeks.   ?? You have any new symptoms, such as itching or a feeling of fullness in the ear.     Where can you learn more?    Go to [email or web address]   Enter Y822 in the search box to learn more about "Eustachian Tube Problems: After Your Visit."    ?? 2006-2013 [employer]. Care instructions adapted under license by [employer] (which disclaims liability or warranty for this information). This care instruction is for use with your licensed healthcare professional. If you have questions about a medical condition or this instruction, always ask your healthcare professional. [employer] disclaims any warranty or liability for your use of this information.  Content Version: 9.7.130178; Last Revised: January 10, 2010      Advise patient to start taking OTC allergy medication (Allegra, Zyrtec, Claritin, or Alavert) daily.  Use allergy free, dye free, fragrance free products on your body and hair.  After being  outdoors, brush hair vigorously, wash face and arms, rinse nostrils with a nasal saline spray and consider changing your clothing.  Dust furniture frequently and wear a mask while doing it.  Vacuum floors weekly.  Remove stuffed animals or extra pillows from the bed.  Clean bedding in hot water weekly.  Sometimes allergies can be so severe that 2 nasal sprays and 2 pills may be needed to control symptoms.        Eustachian Tube Problems: After Your Visit  Your Care Instructions  The eustachian (say "you-STAY-shee-un") tubes run between the inside of the ears and the throat. They keep air pressure stable in the ears. If your eustachian tubes become blocked, the air pressure in your ears changes. The fluids from a cold can clog eustachian tubes, causing pain in the ears. A quick change in air pressure can cause eustachian tubes to close up. This might happen when an airplane changes altitude or when a scuba diver goes up or down underwater.  Eustachian tube problems often clear up on their own or after antibiotic treatment. If your tubes continue to be blocked, you may need surgery.  Follow-up care is a key part of your treatment and safety. Be sure to make and go to all appointments, and call your doctor if you are having problems. It???s also a good idea to know your test results and keep a list of the medicines you take.  How can you care for yourself at home?  ?? To ease ear pain, apply a warm washcloth or a heating pad set on low. There may be some drainage from the ear when the heat melts earwax. Put a cloth between the heat source and your skin.   ?? If your doctor prescribed antibiotics, take them as directed. Do not stop taking them just because you feel better. You need to take the full course of antibiotics.   ?? Your doctor may recommend over-the-counter medicine. Oral or nasal decongestants may relieve ear pain. Avoid decongestants that are combined with antihistamines, which tend to cause more blockage. But  if allergies seem to be the problem, your doctor may recommend a combination. Before you use cough and cold medicines, check the label. These medicines may not be safe  for young children or for people with certain health problems.   When should you call for help?  Call your doctor now or seek immediate medical care if:  ?? You develop sudden, complete hearing loss.   ?? You have severe pain or feel dizzy.   ?? You have new or increasing pus or blood draining from your ear.   ?? You have redness, swelling, or pain around or behind the ear.   Watch closely for changes in your health, and be sure to contact your doctor if:  ?? You do not get better after 2 weeks.   ?? You have any new symptoms, such as itching or a feeling of fullness in the ear.     Where can you learn more?    Go to [email or web address]   Enter Y822 in the search box to learn more about "Eustachian Tube Problems: After Your Visit."    ?? 2006-2013 [employer]. Care instructions adapted under license by [employer] (which disclaims liability or warranty for this information). This care instruction is for use with your licensed healthcare professional. If you have questions about a medical condition or this instruction, always ask your healthcare professional. [employer] disclaims any warranty or liability for your use of this information.  Content Version: 9.7.130178; Last Revised: January 10, 2010

## 2012-05-10 NOTE — Telephone Encounter (Signed)
Rx was already sent 05/08/12.

## 2012-05-15 DIAGNOSIS — M25519 Pain in unspecified shoulder: Secondary | ICD-10-CM | POA: Diagnosis not present

## 2012-05-15 DIAGNOSIS — M7530 Calcific tendinitis of unspecified shoulder: Secondary | ICD-10-CM | POA: Diagnosis not present

## 2012-05-20 DIAGNOSIS — C44711 Basal cell carcinoma of skin of unspecified lower limb, including hip: Secondary | ICD-10-CM | POA: Diagnosis not present

## 2012-05-20 DIAGNOSIS — L820 Inflamed seborrheic keratosis: Secondary | ICD-10-CM | POA: Diagnosis not present

## 2012-05-27 DIAGNOSIS — M25519 Pain in unspecified shoulder: Secondary | ICD-10-CM | POA: Diagnosis not present

## 2012-05-27 DIAGNOSIS — M7530 Calcific tendinitis of unspecified shoulder: Secondary | ICD-10-CM | POA: Diagnosis not present

## 2012-05-30 MED ORDER — VALACYCLOVIR 500 MG TAB
500 mg | ORAL_TABLET | ORAL | Status: DC
Start: 2012-05-30 — End: 2012-12-09

## 2012-05-30 NOTE — Telephone Encounter (Signed)
Last Office Visit  05/08/12 for [facility].

## 2012-06-10 DIAGNOSIS — M25519 Pain in unspecified shoulder: Secondary | ICD-10-CM | POA: Diagnosis not present

## 2012-06-10 DIAGNOSIS — M7530 Calcific tendinitis of unspecified shoulder: Secondary | ICD-10-CM | POA: Diagnosis not present

## 2012-06-10 NOTE — Telephone Encounter (Signed)
Last Office Visit  05/08/12.

## 2012-06-13 DIAGNOSIS — L905 Scar conditions and fibrosis of skin: Secondary | ICD-10-CM | POA: Diagnosis not present

## 2012-06-27 DIAGNOSIS — Z13820 Encounter for screening for osteoporosis: Secondary | ICD-10-CM | POA: Diagnosis not present

## 2012-06-27 DIAGNOSIS — M899 Disorder of bone, unspecified: Secondary | ICD-10-CM | POA: Diagnosis not present

## 2012-06-27 DIAGNOSIS — M8580 Other specified disorders of bone density and structure, unspecified site: Secondary | ICD-10-CM | POA: Insufficient documentation

## 2012-06-27 DIAGNOSIS — M949 Disorder of cartilage, unspecified: Secondary | ICD-10-CM | POA: Diagnosis not present

## 2012-07-08 ENCOUNTER — Encounter

## 2012-07-08 DIAGNOSIS — Z85828 Personal history of other malignant neoplasm of skin: Secondary | ICD-10-CM | POA: Diagnosis not present

## 2012-07-08 DIAGNOSIS — L820 Inflamed seborrheic keratosis: Secondary | ICD-10-CM | POA: Diagnosis not present

## 2012-07-10 DIAGNOSIS — H6120 Impacted cerumen, unspecified ear: Secondary | ICD-10-CM | POA: Diagnosis not present

## 2012-07-10 DIAGNOSIS — R42 Dizziness and giddiness: Secondary | ICD-10-CM | POA: Diagnosis not present

## 2012-08-10 DIAGNOSIS — Z23 Encounter for immunization: Secondary | ICD-10-CM | POA: Diagnosis not present

## 2012-08-15 ENCOUNTER — Encounter

## 2012-08-15 DIAGNOSIS — J019 Acute sinusitis, unspecified: Secondary | ICD-10-CM | POA: Diagnosis not present

## 2012-08-15 DIAGNOSIS — H5710 Ocular pain, unspecified eye: Secondary | ICD-10-CM | POA: Diagnosis not present

## 2012-08-15 DIAGNOSIS — J3489 Other specified disorders of nose and nasal sinuses: Secondary | ICD-10-CM | POA: Diagnosis not present

## 2012-08-26 DIAGNOSIS — R42 Dizziness and giddiness: Secondary | ICD-10-CM | POA: Diagnosis not present

## 2012-08-26 DIAGNOSIS — Z0000 Encounter for general adult medical examination without abnormal findings: Secondary | ICD-10-CM | POA: Diagnosis not present

## 2012-08-26 DIAGNOSIS — E7800 Pure hypercholesterolemia, unspecified: Secondary | ICD-10-CM | POA: Diagnosis not present

## 2012-08-26 DIAGNOSIS — Z833 Family history of diabetes mellitus: Secondary | ICD-10-CM | POA: Diagnosis not present

## 2012-08-26 DIAGNOSIS — E559 Vitamin D deficiency, unspecified: Secondary | ICD-10-CM | POA: Diagnosis not present

## 2012-08-26 DIAGNOSIS — I10 Essential (primary) hypertension: Secondary | ICD-10-CM | POA: Diagnosis not present

## 2012-08-27 LAB — CBC W/O DIFF
HCT: 38.6 % (ref 34.0–46.6)
HGB: 12.3 g/dL (ref 11.1–15.9)
MCH: 28.6 pg (ref 26.6–33.0)
MCHC: 31.9 g/dL (ref 31.5–35.7)
MCV: 90 fL (ref 79–97)
PLATELET: 304 10*3/uL (ref 155–379)
RBC: 4.3 x10E6/uL (ref 3.77–5.28)
RDW: 13.9 % (ref 12.3–15.4)
WBC: 6.3 10*3/uL (ref 3.4–10.8)

## 2012-08-27 LAB — METABOLIC PANEL, COMPREHENSIVE
A-G Ratio: 1.4 (ref 1.1–2.5)
ALT (SGPT): 18 IU/L (ref 0–32)
AST (SGOT): 23 IU/L (ref 0–40)
Albumin: 3.6 g/dL (ref 3.5–4.8)
Alk. phosphatase: 57 IU/L (ref 39–117)
BUN/Creatinine ratio: 21 (ref 11–26)
BUN: 16 mg/dL (ref 8–27)
Bilirubin, total: 0.3 mg/dL (ref 0.0–1.2)
CO2: 23 mmol/L (ref 19–28)
Calcium: 9.1 mg/dL (ref 8.6–10.2)
Chloride: 108 mmol/L (ref 97–108)
Creatinine: 0.75 mg/dL (ref 0.57–1.00)
GFR est AA: 91 mL/min/{1.73_m2} (ref >59)
GFR est non-AA: 79 mL/min/{1.73_m2} (ref >59)
GLOBULIN, TOTAL: 2.5 g/dL (ref 1.5–4.5)
Glucose: 93 mg/dL (ref 65–99)
Potassium: 3.9 mmol/L (ref 3.5–5.2)
Protein, total: 6.1 g/dL (ref 6.0–8.5)
Sodium: 143 mmol/L (ref 134–144)

## 2012-08-27 LAB — NMR LIPOPROFILE WITH LIPIDS (WITHOUT GRAPH)
Cholesterol, Total: 174 mg/dL (ref <200)
HDL-C: 54 mg/dL (ref >=40)
HDL-P (Total): 35.7 umol/L (ref >=30.5)
LDL size: 20.1 nm — ABNORMAL LOW (ref >20.5)
LDL-C: 97 mg/dL (ref <100)
LDL-P: 1417 nmol/L — ABNORMAL HIGH (ref <1000)
LP-IR SCORE: 73 — ABNORMAL HIGH (ref <=45)
Small LDL-P: 984 nmol/L — ABNORMAL HIGH (ref <=527)
Triglycerides: 113 mg/dL (ref <150)

## 2012-08-27 LAB — URINALYSIS W/ RFLX MICROSCOPIC
Bilirubin: NEGATIVE
Glucose: NEGATIVE
Ketone: NEGATIVE
Nitrites: NEGATIVE
Specific Gravity: 1.024 (ref 1.005–1.030)
Urobilinogen: 0.2 mg/dL (ref 0.0–1.9)
pH (UA): 5.5 (ref 5.0–7.5)

## 2012-08-27 LAB — HEMOGLOBIN A1C WITH EAG: Hemoglobin A1c: 6 % — ABNORMAL HIGH (ref 4.8–5.6)

## 2012-08-27 LAB — TSH 3RD GENERATION: TSH: 0.359 u[IU]/mL — ABNORMAL LOW (ref 0.450–4.500)

## 2012-08-27 LAB — MICROSCOPIC EXAMINATION

## 2012-08-27 LAB — LIPID PANEL
Cholesterol, total: 156 mg/dL (ref 100–199)
HDL Cholesterol: 49 mg/dL (ref >39)
LDL, calculated: 84 mg/dL (ref 0–99)
Triglyceride: 113 mg/dL (ref 0–149)
VLDL, calculated: 23 mg/dL (ref 5–40)

## 2012-08-27 LAB — VITAMIN D, 25 HYDROXY: VITAMIN D, 25-HYDROXY: 47.6 ng/mL (ref 30.0–100.0)

## 2012-08-28 DIAGNOSIS — M545 Low back pain, unspecified: Secondary | ICD-10-CM | POA: Diagnosis not present

## 2012-08-28 DIAGNOSIS — M5430 Sciatica, unspecified side: Secondary | ICD-10-CM | POA: Diagnosis not present

## 2012-08-28 DIAGNOSIS — M25559 Pain in unspecified hip: Secondary | ICD-10-CM | POA: Diagnosis not present

## 2012-08-28 NOTE — Progress Notes (Signed)
Quick Note:    LABS REVIEWED AND PT NOTIFIED ABOUT THE ABNORMAL VALUES BY LETTER. SEE RESULT LETTER FOR DETAILS. WILL ALSO DISCUSS AT THE NEXT OFFICE VISIT.      ______

## 2012-09-04 DIAGNOSIS — M545 Low back pain, unspecified: Secondary | ICD-10-CM | POA: Diagnosis not present

## 2012-09-04 DIAGNOSIS — R3129 Other microscopic hematuria: Secondary | ICD-10-CM | POA: Diagnosis not present

## 2012-09-04 DIAGNOSIS — I10 Essential (primary) hypertension: Secondary | ICD-10-CM | POA: Diagnosis not present

## 2012-09-04 DIAGNOSIS — E7800 Pure hypercholesterolemia, unspecified: Secondary | ICD-10-CM | POA: Diagnosis not present

## 2012-09-04 DIAGNOSIS — T7840XA Allergy, unspecified, initial encounter: Secondary | ICD-10-CM | POA: Diagnosis not present

## 2012-09-04 NOTE — Progress Notes (Signed)
HISTORY OF PRESENT ILLNESS  STORY CONTI is a 76 y.o. female presents with Cholesterol Problem, Results, Blood Pressure Check, Referral Follow Up and Allergies    Agree with nurse note.  Subjective:  Kathryn [name] presents to our office for a cholesterol problem, lab results, blood pressure check, referral followup and allergies.    She has seen Dr. Merilynn Finland, her ENT specialist, due to facial pain and was treated for sinusitis using Keflex.  Seems to be all better now.    She has a history of high blood pressure, low vitamin D, high cholesterol, arthritis in her hips and ICA stenosis.  She has seen a specialist for the ICA stenosis and was told it is no big deal.  It was worse on the left at 16-49%.  She had blood work performed by our office this month and would like to discuss the results.  She has been working really hard to try to decrease her cholesterol levels.      She continues to see Dr. Philomena Doheny for right hip pain.  She is doing better, but tends to have some challenges with her hips, especially since she had them replaced in 2010 by Dr. Philomena Doheny.  She will continue to work with him and plans to participate in water aerobics soon with a heated pool.      She has had nasal congestion with postnasal drainage, itchy nose, stuffy nose and itchy eyes.  She has not taken any medications for allergies.  She denies any fever, chills, colored congestion, chest pain, chest tightness, shortness of breath, wheezing or other associated symptoms.     She has chronically had blood in her urine and is not sure why Keflex would not have covered any bladder signs.  She will leave [place] a new specimen today to check for infection in her urine.    She continues to tolerate Diovan 160 mg daily well for her blood pressure.  She denies any headaches, dizziness or other associated symptoms.      MedDATA/leh       ROS    Review of Systems negative except as noted above in HPI.    ALLERGIES:    Allergies   Allergen Reactions   ???  Other Medication Rash     All cillins   ??? Penicillins Rash and Swelling   ??? Tetracycline Itching   ??? Bactrim (Sulfamethoprim Ds) Itching   ??? Celebrex (Celecoxib) Other (comments)     Memory disturbance, slowed thinking   ??? Crestor (Rosuvastatin) Other (comments)     Elevated CPK   ??? Lipitor (Atorvastatin) Other (comments)     Leg cramps   ??? Niaspan (Niacin) Other (comments)     Caused elevated BP   ??? Zocor (Simvastatin) Other (comments)     Leg cramps       CURRENT MEDICATIONS:  Current outpatient prescriptions:mometasone (NASONEX) 50 mcg/actuation nasal spray, 2 Sprays by Both Nostrils route daily., Disp: 1 Container, Rfl: 0;  furosemide (LASIX) 20 mg tablet, TAKE 1 TABLET BY MOUTH DAILY, Disp: 90 Tab, Rfl: 1;  valACYclovir (VALTREX) 500 mg tablet, TAKE 1 TAB BY MOUTH DAILY. FOR HSV, Disp: 90 Tab, Rfl: 3;  valsartan (DIOVAN) 160 mg tablet, Take 1 Tab by mouth daily. Indications: HYPERTENSION, Disp: 90 Tab, Rfl: 2  valACYclovir (VALTREX) 500 mg tablet, Take 1 Tab by mouth daily. For HSV, Disp: 90 Tab, Rfl: 3;  EVISTA 60 mg tablet, TAKE 1 TABLET EVERY DAY FOR BONES, Disp: 90 Tab, Rfl: 3;  aspirin (ASPIRIN) 325 mg tablet, Take 325 mg by mouth daily.  , Disp: , Rfl: ;  pravastatin (PRAVACHOL) 80 mg tablet, TAKE 1 TABLET BY MOUTH EVERY DAY, Disp: 90 Tab, Rfl: 3;  calcium-cholecalciferol, d3, (CALCIUM 600 + D) 600-125 mg-unit Tab, Take  by mouth.  , Disp: , Rfl:   calcium 500 mg Tab, Take  by mouth.  , Disp: , Rfl: ;  magnesium 250 mg Tab, Take  by mouth. Unsure of exact strength , Disp: , Rfl: ;  b complex vitamins tablet, Take 1 Tab by mouth daily.  , Disp: , Rfl: ;  ascorbic acid (VITAMIN C) 500 mg tablet, Take 500 mg by mouth nightly., Disp: , Rfl: ;  multivitamin (ONE A DAY) tablet, Take 1 Tab by mouth daily., Disp: , Rfl: ;  cephALEXin (KEFLEX) 500 mg capsule, , Disp: , Rfl:   levofloxacin (LEVAQUIN) 500 mg tablet, , Disp: , Rfl: ;  methylPREDNISolone (MEDROL DOSEPACK) 4 mg tablet, , Disp: , Rfl:     PAST MEDICAL  HISTORY:    Past Medical History   Diagnosis Date   ??? Edema      left ankle   ??? DDD (degenerative disc disease) 10/1999     L 4-5 by MRI.  Dr. Shiela Mayer.  Dr. Robie Ridge, Pain   ??? Urinary incontinence, stress      mild.  Dr. Winnifred Friar   ??? Polyp of rectum      Dr. Reed Pandy   ??? Cystocele      Dr. Winnifred Friar   ??? Postmenopausal HRT (hormone replacement therapy)      Hx  for 6 years.  Dr. Lavone Nian   ??? Morton's neuroma 2000     Left.  Dr. Gillis Santa   ??? Hematuria of undiagnosed cause 1980, 2009   ??? Single renal cyst 02/12/2008     Left.  0.9 cm   ??? Subchondral cysts 2009     Left hip femoral heads.  Dr. Adah Perl   ??? DJD (degenerative joint disease) 05/26/04     Dr. Shiela Mayer.   ??? Hypercholesterolemia    ??? Hypertension    ??? Kidney stone 12/2002     Dr. Gillis Ends   ??? Chickenpox childhood   ??? Rotator cuff tendinitis 12/20/04     Right.  Dr. Jen Mow   ??? Benign positional vertigo 01/07/04     Dr. Minette Headland   ??? SOB (shortness of breath) 07/11/04     Dr. Abigail Miyamoto.  Neg Stress Cardiolite.  EF 60 %.   ??? Unspecified vitamin D deficiency 2008   ??? Herpes zoster 02/26/07, 06/21/07, 08/27/07     Left buttocks then Right T2-3 dermatome   ??? Herpes simplex type 2 infection 10/05/08     Left buttocks   ??? Cataract 2002     Dr. Theodoro Grist   ??? Multinodular goiter 10/2009     Dr. Lewayne Bunting.  benign.   ??? Internal carotid artery stenosis 09/18/11     Left.  16-49%.     ??? Basal cell cancer 02/2012     left lower leg.  Left foot.  Dr. Tilden Fossa.   ??? Near syncope 02/26/12     Negative Stress Test.  due to low blood sugar.  Dr. Meredith Mody   ??? Osteopenia 06/27/12     [facility]   ??? Hip pain [id number]     Right.  due to DJD.  Dr.  Birdena Crandall.   ??? Facial pain 2013     due to sinusitis.  Dr. Claria Dice.       PAST SURGICAL HISTORY:    Past Surgical History   Procedure Laterality Date   ??? Hx cyst removal  1970s     Rt breast then Left.  benign.   ??? Hx bunionectomy   1970s, 2003     Bilateral.  Dr. Stephannie Li.   ??? Hx hysterectomy  1988     with BSO   ??? Pr foot/toes surgery proc unlisted  2000     Left Morton's Neuroma.  Dr. Stephannie Li   ??? Hx hip replacement  12/21/08 Right, 06/14/09 Left     bilaterally.  Dr. Birdena Crandall      ??? Hx breast biopsy  1970's     bil.   ??? Hx cataract removal  2002, 2005     bilateral.  Dr. Theodoro Grist.   ??? Hx tonsillectomy     ??? Hx appendectomy     ??? Ir fine needle aspiration thyroid  10/2009     Right upper pole.  due to multinodular goiter.  Dr. Lewayne Bunting.  benign.   ??? Hx malignant skin lesion excision  04/23/2012     left lower leg       FAMILY HISTORY:    Family History   Problem Relation Age of Onset   ??? Diabetes Mother    ??? Heart Attack Mother 54   ??? Cancer Father      pancreatic   ??? Cancer Brother      bile duct cancer   ??? Heart Attack Sister 24   ??? Arthritis-osteo Sister    ??? Asthma Sister        SOCIAL HISTORY:    History     Social History   ??? Marital Status: MARRIED     Spouse Name: N/A     Number of Children: N/A   ??? Years of Education: N/A     Social History Main Topics   ??? Smoking status: Former Smoker -- 0.50 packs/day for 10 years     Types: Cigarettes     Quit date: 10/17/1987   ??? Smokeless tobacco: Never Used   ??? Alcohol Use: No   ??? Drug Use: No   ??? Sexually Active: Yes -- Female partner(s)     Birth Control/ Protection: None     Other Topics Concern   ??? Not on file     Social History Narrative   ??? No narrative on file       IMMUNIZATIONS:    Immunization History   Administered Date(s) Administered   ??? H1N1 Influenza Virus Vaccine 09/15/2008   ??? Influenza Vaccine Split 08/26/2009   ??? Influenza Vaccine Whole 06/16/2008   ??? Pneumococcal Vaccine 10/16/1998, 02/21/2011   ??? TD Vaccine 01/06/2003   ??? Zoster 05/25/2011         PHYSICAL EXAMINATION    Vital Signs  BP 116/64   Pulse 91   Temp(Src) 96.5 ??F (35.8 ??C) (Oral)   Ht 5' 1.81" (1.57 m)   Wt 153 lb 12.8 oz (69.763 kg)   BMI 28.3 kg/m2   SpO2 97%    General appearance - Well nourished. Well  appearing.  Well developed.  No acute distress. Overweight.   Head - Normocephalic.  Atraumatic.    Eyes - pupils equal and reactive. Extraocular eye movements intact. Sclera anicteric.  Mildly injected sclera.  Ears - Hearing is  grossly normal bilaterally.      Nose - normal and patent.  No polyps noted.  No erythema.  No discharge.    Mouth - mucous membranes with adequate moisture.  Posterior pharynx normal with cobblestone appearance.  No erythema, white exudate or obstruction.  Neck - supple.  Midline trachea.  No carotid bruits noted bilaterally.  No thyromegaly noted.  Chest - clear to auscultation bilaterally anterriorly and posteriorly.  No wheezes.  No rales or rhonchi.  Breath sounds are symmetrical bilaterally.  Unlabored respirations.  Heart - normal rate.  Regular rhythm.  Normal S1, S2.  No murmur noted.  No rubs, clicks or gallops noted.  Abdomen - soft and nondistended.  No masses or organomegaly.  No rebound, rigidity or guarding.  Bowel sounds normal x 4 quadrants.  No tenderness noted.  Neurological - awake, alert and oriented to person, place, and time and event.  Cranial nerves II through XII intact.  Clear speech.  Muscle strength is +5/5 x 4 extremities.  Sensation is intact to light touch bilaterally.  Steady gait.   Heme/Lymph - peripheral pulses normal x 4 extremities.  No peripheral edema is noted.    Musculoskeletal - Intact x 4 extremities.  Full ROM x 4 extremities.  Mild hip pain with movement.    Back exam - normal range of motion.  No pain on palpation of the spinous processes in the cervical, thoracic, lumbar, sacral regions.  No CVA tenderness.    Skin - no rashes, erythema, ecchymosis, lacerations, abrasions, suspicious moles noted  Psychological -   normal behavior, dress and thought processes.  Good insight. Good eye contact.  Normal affect.  Appropriate mood.  Normal speech.    DATA REVIEWED    Results for orders placed in visit on 05/08/12   URINALYSIS W/ RFLX MICROSCOPIC        Result Value Range    Specific Gravity 1.024  1.005 - 1.030    pH 5.5  5.0 - 7.5    Color Yellow  Yellow    Appearance Cloudy (*) Clear    Leukocyte Esterase 1+ (*) Negative    Protein 1+ (*) Negative/Trace    Glucose Negative  Negative    Ketone Negative  Negative    Blood 2+ (*) Negative    Bilirubin Negative  Negative    Urobilinogen 0.2  0.0 - 1.9 mg/dL    Nitrites Negative  Negative    Microscopic Examination See additional order     LIPID PANEL       Result Value Range    Cholesterol, total 156  100 - 199 mg/dL    Triglyceride 161  0 - 149 mg/dL    HDL Cholesterol 49  >39 mg/dL    VLDL, calculated 23  5 - 40 mg/dL    LDL, calculated 84  0 - 99 mg/dL   METABOLIC PANEL, COMPREHENSIVE       Result Value Range    Glucose 93  65 - 99 mg/dL    BUN 16  8 - 27 mg/dL    Creatinine 0.96  0.45 - 1.00 mg/dL    GFR est non-AA 79  >40 mL/min/1.73    GFR est AA 91  >59 mL/min/1.73    BUN/Creatinine ratio 21  11 - 26    Sodium 143  134 - 144 mmol/L    Potassium 3.9  3.5 - 5.2 mmol/L    Chloride 108  97 - 108 mmol/L    CO2  23  19 - 28 mmol/L    Calcium 9.1  8.6 - 10.2 mg/dL    Protein, total 6.1  6.0 - 8.5 g/dL    Albumin 3.6  3.5 - 4.8 g/dL    GLOBULIN, TOTAL 2.5  1.5 - 4.5 g/dL    A-G Ratio 1.4  1.1 - 2.5    Bilirubin, total 0.3  0.0 - 1.2 mg/dL    Alk. phosphatase 57  39 - 117 IU/L    AST 23  0 - 40 IU/L    ALT 18  0 - 32 IU/L   TSH, 3RD GENERATION       Result Value Range    TSH 0.359 (*) 0.450 - 4.500 uIU/mL   VITAMIN D, 25 HYDROXY       Result Value Range    VITAMIN D, 25-HYDROXY 47.6  30.0 - 100.0 ng/mL   NMR LIPOPROFILE       Result Value Range    LDL-P 1417 (*) <1000 nmol/L    LDL-C 97  <100 mg/dL    HDL-C 54  >=09 mg/dL    Triglycerides 811  <914 mg/dL    Cholesterol, Total 782  <200 mg/dL    HDL-P (Total) 95.6  >=30.5 umol/L    Small LDL-P 984 (*) <=527 nmol/L    LDL size 20.1 (*) >20.5 nm    LP-IR SCORE 73 (*) <=45   HEMOGLOBIN A1C       Result Value Range    Hemoglobin A1c 6.0 (*) 4.8 - 5.6 %   CBC W/O DIFF        Result Value Range    WBC 6.3  3.4 - 10.8 x10E3/uL    RBC 4.30  3.77 - 5.28 x10E6/uL    HGB 12.3  11.1 - 15.9 g/dL    HCT 21.3  08.6 - 57.8 %    MCV 90  79 - 97 fL    MCH 28.6  26.6 - 33.0 pg    MCHC 31.9  31.5 - 35.7 g/dL    RDW 46.9  62.9 - 52.8 %    PLATELET 304  155 - 379 x10E3/uL   MICROSCOPIC EXAMINATION       Result Value Range    WBC 11-30 (*) 0 -  5 /hpf    RBC 4-10 (*) 0 -  3 /hpf    Epithelial cells 0-10  0 - 10 /hpf    Casts Present (*) None seen /lpf    Cast type Comment  N/A    Crystals Present (*) N/A    Crystal type Comment  N/A    Mucus Present  Not Estab.    Bacteria Few  None seen/Few             ASSESSMENT and PLAN    1. Essential hypertension, benign     2. Microscopic hematuria  URINALYSIS W/ RFLX MICROSCOPIC, CULTURE, URINE   3. Allergy, unspecified not elsewhere classified  mometasone (NASONEX) 50 mcg/actuation nasal spray   4. Hypercholesterolemia      with elevated LDLP and sdLDL   5. Hip pain  methylPREDNISolone (MEDROL DOSEPACK) 4 mg tablet    Right.   6. Unspecified vitamin D deficiency     7. Internal carotid artery stenosis     8. Acute sinusitis  cephALEXin (KEFLEX) 500 mg capsule, levofloxacin (LEVAQUIN) 500 mg tablet    resolved after Keflex     cardiovascular risk and specific lipid/LDL goals reviewed  use of aspirin to  prevent MI and TIA's discussed    Continue current medications and care  Most recent tests reviewed  Recheck pertinent labs today  Get recent office visit notes from ENT, ortho  Addressed weight, diet and exercise with patient.  Decrease carbohydrates (white foods, sweet foods, sweet drinks and alcohol), increase green leafy vegetables and protein (lean meats and beans).  Avoid fried foods.  Increase water intake.  Eat 3-5 small meals daily.  Increase physical activity to 30 minutes daily for health benefit or 60 minutes daily to prevent weight regain.  Counseled patient on health concerns:  Bp.  Hip care.  Lipids.  Insulin resistance.  Health benefits of exercise.   Relevant handouts given and discussed with patient  Immunizations noted;   Offered empathy, support, legitamation, prayers, partnership to patient  Praised patient for progress  Follow-up Disposition:  Return in about 6 months (around 03/04/2013) for results, cholesterol.  Discussed the patient's BMI with her.  The BMI follow up plan is as follows: BMI is out of normal parameters and plan is as follows: I have counseled this patient on diet and exercise regimens.      More than 30 mins spent face to face with patient and more than 50% of this time spent in counseling and coordinating care.      Patient Instructions       DASH Diet: After Your Visit  Your Care Instructions  The DASH diet is an eating plan that can help lower your blood pressure. DASH stands for Dietary Approaches to Stop Hypertension. Hypertension is high blood pressure.  The DASH diet focuses on eating foods that are high in calcium, potassium, and magnesium. These nutrients can lower blood pressure. The foods that are highest in these nutrients are fruits, vegetables, low-fat dairy products, nuts, seeds, and legumes. But taking calcium, potassium, and magnesium supplements instead of eating foods that are high in those nutrients does not have the same effect. The DASH diet also includes whole grains, fish, and poultry.  The DASH diet is one of several lifestyle changes your doctor may recommend to lower your high blood pressure. Your doctor may also want you to decrease the amount of sodium in your diet. Lowering sodium while following the DASH diet can lower blood pressure even further than just the DASH diet alone.  Follow-up care is a key part of your treatment and safety. Be sure to make and go to all appointments, and call your doctor if you are having problems. It's also a good idea to know your test results and keep a list of the medicines you take.  How can you care for yourself at home?  Following the DASH diet  ?? Eat 4 to 5 servings of  fruit each day. A serving is 1 medium-sized piece of fruit, ?? cup chopped or canned fruit, 1/4 cup dried fruit, or 4 ounces (?? cup) of fruit juice. Choose fruit more often than fruit juice.  ?? Eat 4 to 5 servings of vegetables each day. A serving is 1 cup of lettuce or raw leafy vegetables, ?? cup of chopped or cooked vegetables, or 4 ounces (?? cup) of vegetable juice. Choose vegetables more often than vegetable juice.  ?? Get 2 to 3 servings of low-fat and fat-free dairy each day. A serving is 8 ounces of milk, 1 cup of yogurt, or 1 ?? ounces of cheese.  ?? Eat 6 to 8 servings of grains each day. A serving is 1 slice of bread, 1  ounce of dry cereal, or ?? cup of cooked rice, pasta, or cooked cereal. Try to choose whole-grain products as much as possible.  ?? Limit lean meat, poultry, and fish to 2 servings each day. A serving is 3 ounces, about the size of a deck of cards.  ?? Eat 4 to 5 servings of nuts, seeds, and legumes (cooked dried beans, lentils, and split peas) each week. A serving is 1/3 cup of nuts, 2 tablespoons of seeds, or ?? cup cooked dried beans or peas.  ?? Limit fats and oils to 2 to 3 servings each day. A serving is 1 teaspoon of vegetable oil or 2 tablespoons of salad dressing.  ?? Limit sweets and added sugars to 5 servings or less a week. A serving is 1 tablespoon jelly or jam, ?? cup sorbet, or 1 cup of lemonade.  ?? Eat less than 2,300 milligrams (mg) of sodium a day. If you have high blood pressure, diabetes, or chronic kidney disease, if you are African-American, or if you are older than age 77, try to limit the amount of sodium you eat to less than 1,500 mg a day.  Tips for success  ?? Start small. Do not try to make dramatic changes to your diet all at once. You might feel that you are missing out on your favorite foods and then be more likely to not follow the plan. Make small changes, and stick with them. Once those changes become habit, add a few more changes.  ?? Try some of the following:  ?? Make  it a goal to eat a fruit or vegetable at every meal and at snacks. This will make it easy to get the recommended amount of fruits and vegetables each day.  ?? Try yogurt topped with fruit and nuts for a snack or healthy dessert.  ?? Add lettuce, tomato, cucumber, and onion to sandwiches.  ?? Combine a ready-made pizza crust with low-fat mozzarella cheese and lots of vegetable toppings. Try using tomatoes, squash, spinach, broccoli, carrots, cauliflower, and onions.  ?? Have a variety of cut-up vegetables with a low-fat dip as an appetizer instead of chips and dip.  ?? Sprinkle sunflower seeds or chopped almonds over salads. Or try adding chopped walnuts or almonds to cooked vegetables.  ?? Try some vegetarian meals using beans and peas. Add garbanzo or kidney beans to salads. Make burritos and tacos with mashed pinto beans or black beans.    Where can you learn more?    Go to [employer]   Enter H967 in the search box to learn more about "DASH Diet: After Your Visit."    ?? 2006-2013 [employer]. Care instructions adapted under license by [employer] (which disclaims liability or warranty for this information). This care instruction is for use with your licensed healthcare professional. If you have questions about a medical condition or this instruction, always ask your healthcare professional. [employer] disclaims any warranty or liability for your use of this information.  Content Version: 9.8.193578; Last Revised: Feb 21, 2012                Hip Arthritis: Exercises  Your Care Instructions  Here are some examples of exercises for hip arthritis. Start each exercise slowly. Ease off the exercise if you start to have pain.  Your doctor or physical therapist will tell you when you can start these exercises and which ones will work best for you.  How to do the exercises  Straight-leg raises to  the outside    1. Lie on your side, with your affected hip on top.  2. Tighten the  front thigh muscles of your top leg to keep your knee straight.  3. Keep your hip and your leg straight in line with the rest of your body, and keep your knee pointing forward. Do not drop your hip back.  4. Lift your top leg straight up toward the ceiling, about 12 inches off the floor. Hold for about 6 seconds, then slowly lower your leg.  5. Repeat 8 to 12 times.  6. Switch legs and repeat steps 1 through 5, even if only one hip is sore.  Straight-leg raises to the inside    1. Lie on your side with your affected hip on the floor.  2. You can either prop up your other leg on a chair, or you can bend that knee and put that foot in front of your other knee. Do not drop your hip back.  3. Tighten the muscles on the front thigh of your bottom leg to straighten that knee.  4. Keep your kneecap pointing forward and your leg straight, and lift your bottom leg up toward the ceiling about 6 inches. Hold for about 6 seconds, then lower slowly.  5. Repeat 8 to 12 times.  6. Switch legs and repeat steps 1 through 5, even if only one hip is sore.  Hip hike    1. Stand sideways on the bottom step of a staircase, and hold on to the banister or wall.  2. Keeping both knees straight, lift your good leg off the step and let it hang down. Then hike your good hip up to the same level as your affected hip or a little higher.  3. Repeat 8 to 12 times.  4. Switch legs and repeat steps 1 through 3, even if only one hip is sore.  Bridging    1. Lie on your back with both knees bent. Your knees should be bent about 90 degrees.  2. Then push your feet into the floor, squeeze your buttocks, and lift your hips off the floor until your shoulders, hips, and knees are all in a straight line.  3. Hold for about 6 seconds as you continue to breathe normally, and then slowly lower your hips back down to the floor and rest for up to 10 seconds.  4. Repeat 8 to 12 times.  Hamstring stretch (lying down)    1. Lie flat on your back with your legs  straight. If you feel discomfort in your back, place a small towel roll under your lower back.  2. Holding the back of your affected leg, lift your leg straight up and toward your body until you feel a stretch at the back of your thigh.  3. Hold the stretch for at least 30 seconds.  4. Repeat 2 to 4 times.  5. Switch legs and repeat steps 1 through 4, even if only one hip is sore.  Standing quadriceps stretch    1. If you are not steady on your feet, hold on to a chair, counter, or wall. You can also lie on your stomach or your side to do this exercise.  2. Bend the knee of the leg you want to stretch, and reach behind you to grab the front of your foot or ankle with the hand on the same side. For example, if you are stretching your right leg, use your right hand.  3. Keeping  your knees next to each other, pull your foot toward your buttock until you feel a gentle stretch across the front of your hip and down the front of your thigh. Your knee should be pointed directly to the ground, and not out to the side.  4. Hold the stretch for at least 15 to 30 seconds.  5. Repeat 2 to 4 times.  6. Switch legs and repeat steps 1 through 5, even if only one hip is sore.  Hip rotator stretch    1. Lie on your back with both knees bent and your feet flat on the floor.  2. Put the ankle of your affected leg on your opposite thigh near your knee.  3. Use your hand to gently push your knee away from your body until you feel a gentle stretch around your hip.  4. Hold the stretch for 15 to 30 seconds.  5. Repeat 2 to 4 times.  6. Repeat steps 1 through 5, but this time use your hand to gently pull your knee toward your opposite shoulder.  7. Switch legs and repeat steps 1 through 6, even if only one hip is sore.  Knee-to-chest    1. Lie on your back with your knees bent and your feet flat on the floor.  2. Bring your affected leg to your chest, keeping the other foot flat on the floor (or keeping the other leg straight, whichever  feels better on your lower back).  3. Keep your lower back pressed to the floor. Hold for at least 15 to 30 seconds.  4. Relax, and lower the knee to the starting position.  5. Repeat 2 to 4 times.  6. Switch legs and repeat steps 1 through 5, even if only one hip is sore.  7. To get more stretch, put your other leg flat on the floor while pulling your knee to your chest.  Clamshell    1. Lie on your side, with your affected hip on top. Keep your feet and knees together and your knees bent.  2. Raise your top knee, but keep your feet together. Do not let your hips roll back. Your legs should open up like a clamshell.  3. Hold for 6 seconds.  4. Slowly lower your knee back down. Rest for 10 seconds.  5. Repeat 8 to 12 times.  6. Switch legs and repeat steps 1 through 5, even if only one hip is sore.  Follow-up care is a key part of your treatment and safety. Be sure to make and go to all appointments, and call your doctor if you are having problems. It's also a good idea to know your test results and keep a list of the medicines you take.    Where can you learn more?    Go to [email or web address]   Enter [phone] in the search box to learn more about "Hip Arthritis: Exercises."    ?? 2006-2013 [employer]. Care instructions adapted under license by [employer] (which disclaims liability or warranty for this information). This care instruction is for use with your licensed healthcare professional. If you have questions about a medical condition or this instruction, always ask your healthcare professional. [employer] disclaims any warranty or liability for your use of this information.  Content Version: 9.8.193578; Last Revised: August 30, 2010                Hyperlipidemia: After Your Visit  Your Care Instructions  Hyperlipidemia is too much fat in  your blood. The body has several kinds of fat, including cholesterol and triglycerides. Your body needs fat for many things, such as  making new cells. But too much fat in your blood increases your chances of having a heart attack or stroke.  You may be able to lower your cholesterol and triglycerides with a heart-healthy diet, exercise, and if needed, medicine. Your doctor may want you to try lifestyle changes first to see whether they lower the fat in your blood. You may need to take medicine if lifestyle changes do not lower the fat in your blood enough.  Follow-up care is a key part of your treatment and safety. Be sure to make and go to all appointments, and call your doctor if you are having problems. It???s also a good idea to know your test results and keep a list of the medicines you take.  How can you care for yourself at home?  Take your medicines  ?? Take your medicines exactly as prescribed. Call your doctor if you think you are having a problem with your medicine.  ?? If you take medicine to lower your cholesterol, go to follow-up visits. You will need to have blood tests.  ?? Do not take large doses of niacin, which is a B vitamin, while taking medicine called statins. It may increase the chance of muscle pain and liver problems.  ?? Talk to your doctor about avoiding grapefruit juice if you are taking statins. Grapefruit juice can raise the level of this medicine in your blood. This could increase side effects.  Eat more fruits, vegetables, and fiber  ?? Fruits and vegetables have lots of nutrients that help protect against heart disease, and they have little???if any???fat. Try to eat at least five servings a day. Dark green, deep orange, or yellow fruits and vegetables are healthy choices.  ?? Keep carrots, celery, and other veggies handy for snacks. Buy fruit that is in season and store it where you can see it so that you will be tempted to eat it. Cook dishes that have a lot of veggies in them, such as stir-fries and soups.  ?? Foods high in fiber may reduce your cholesterol and provide important vitamins and minerals. High-fiber foods  include whole-grain cereals and breads, oatmeal, beans, brown rice, citrus fruits, and apples.  ?? Buy whole-grain breads and cereals instead of white bread and pastries.  Limit saturated fat  ?? Read food labels and try to avoid saturated fat and trans fat. They increase your risk of heart disease.  ?? Use olive or canola oil when you cook. Try cholesterol-lowering spreads, such as Benecol or Take Control.  ?? Bake, broil, grill, or steam foods instead of frying them.  ?? Limit the amount of high-fat meats you eat, including hot dogs and sausages. Cut out all visible fat when you prepare meat.  ?? Eat fish, skinless poultry, and soy products such as tofu instead of high-fat meats. Soybeans may be especially good for your heart. Eat at least two servings of fish a week. Certain fish, such as salmon, contain omega-3 fatty acids, which may help reduce your risk of heart attack.  ?? Choose low-fat or fat-free milk and dairy products.  Get exercise, limit alcohol, and quit smoking  ?? Get more exercise. Work with your doctor to set up an exercise program. Even if you can do only a small amount, exercise will help you get stronger, have more energy, and manage your weight and your stress. Walking  is an easy way to get exercise. Gradually increase the amount you walk every day. Aim for at least 30 minutes on most days of the week. You also may want to swim, bike, or do other activities.  ?? Limit alcohol to no more than 2 drinks a day for men and 1 drink a day for women.  ?? Do not smoke. If you need help quitting, talk to your doctor about stop-smoking programs and medicines. These can increase your chances of quitting for good.  When should you call for help?  Call 911 anytime you think you may need emergency care. For example, call if:  ?? You have symptoms of a heart attack. These may include:  ?? Chest pain or pressure, or a strange feeling in the chest.  ?? Sweating.  ?? Shortness of breath.  ?? Nausea or vomiting.  ?? Pain,  pressure, or a strange feeling in the back, neck, jaw, or upper belly or in one or both shoulders or arms.  ?? Lightheadedness or sudden weakness.  ?? A fast or irregular heartbeat.  After you call 911, the operator may tell you to chew 1 adult-strength or 2 to 4 low-dose aspirin. Wait for an ambulance. Do not try to drive yourself.  ?? You have signs of a stroke. These may include:  ?? Sudden numbness, paralysis, or weakness in your face, arm, or leg, especially on only one side of your body.  ?? New problems with walking or balance.  ?? Sudden vision changes.  ?? Drooling or slurred speech.  ?? New problems speaking or understanding simple statements, or feeling confused.  ?? A sudden, severe headache that is different from past headaches.  ?? You passed out (lost consciousness).  Call your doctor now or seek immediate medical care if:  ?? You have muscle pain or weakness.  Watch closely for changes in your health, and be sure to contact your doctor if:  ?? You are very tired.  ?? You have an upset stomach, gas, constipation, or belly pain or cramps.    Where can you learn more?    Go to [email or web address]   Enter C406 in the search box to learn more about "Hyperlipidemia: After Your Visit."    ?? 2006-2013 [employer]. Care instructions adapted under license by [employer] (which disclaims liability or warranty for this information). This care instruction is for use with your licensed healthcare professional. If you have questions about a medical condition or this instruction, always ask your healthcare professional. [employer] disclaims any warranty or liability for your use of this information.  Content Version: 9.8.193578; Last Revised: July 28, 2010                Saline Nasal Washes: After Your Visit  Your Care Instructions  Saline nasal washes help keep the nasal passages open by washing out thick or dried mucus. This simple remedy can help relieve symptoms of allergies,  sinusitis, and colds. It also can make the nose feel more comfortable by keeping the mucous membranes moist. You may notice a little burning sensation in your nose the first few times you use the solution, but this usually gets better in a few days.  Follow-up care is a key part of your treatment and safety. Be sure to make and go to all appointments, and call your doctor if you are having problems. It???s also a good idea to know your test results and keep a list of the medicines you take.  How can you care for yourself at home?  ?? You can buy premixed saline solution in a squeeze bottle or other sinus rinse products at a drugstore. Read and follow the instructions on the label.  ?? You also can make your own saline solution by adding 1 teaspoon of salt and 1 teaspoon of baking soda to 2 cups of distilled water.  ?? If you use a homemade solution, pour a small amount into a clean bowl. Using a rubber bulb syringe, squeeze the syringe and place the tip in the salt water. Pull a small amount of the salt water into the syringe by relaxing your hand.  ?? Sit down with your head tilted slightly back. Do not lie down. Put the tip of the bulb syringe or the squeeze bottle a little way into one of your nostrils. Gently squirt a small amount (about 1 teaspoon) into the nostril. Repeat with the other nostril. Some sneezing and gagging are normal at first.  ?? Gently blow your nose.  ?? Wipe the syringe or bottle tip clean after each use.  ?? Repeat this 2 or 3 times a day.  ?? Use nasal washes gently if you have nosebleeds often.  When should you call for help?  Watch closely for changes in your health, and be sure to contact your doctor if:  ?? You often get nosebleeds.  ?? You have problems doing the nasal washes.    Where can you learn more?    Go to [email or web address]   Enter B784 in the search box to learn more about "Saline Nasal Washes: After Your Visit."    ?? 2006-2013 [employer]. Care  instructions adapted under license by [employer] (which disclaims liability or warranty for this information). This care instruction is for use with your licensed healthcare professional. If you have questions about a medical condition or this instruction, always ask your healthcare professional. [employer] disclaims any warranty or liability for your use of this information.  Content Version: 9.8.193578; Last Revised: November 28, 2010           Advised protocol for clearing congestion:  Increase fluid intake, especially water to thin mucous and boost the immune system.  Avoid sugar and dairy while congested since they thicken mucous.  Get plenty of rest!  Gargle 3 times daily and as needed in Listerine or warm salt water vinegar solutions (1 tsp salt, 1 tsp vinegar in 1 cup lukewarm water.)  Use OTC nasal saline spray up each nostril twice daily.  Use humidifier at bedtime.  Use OTC Mucinex 600 mg twice daily to loosen mucous.    Use OTC Tylenol Arthritis or Ibuprofen up to 800 mg up to 3 times daily as needed for pain, fever or headaches.  Avoid decongestants and Ibuprofen if you have high blood pressure!  If mucous is consistently discolored yellow or green throughout the day for more than a week, call the doctor for an evaluation.    Advise patient to start taking OTC allergy medication (Allegra, Zyrtec, Claritin, or Alavert) daily.  Use allergy free, dye free, fragrance free products on your body and hair.  After being outdoors, brush hair vigorously, wash face and arms, rinse nostrils with a nasal saline spray and consider changing your clothing.  Dust furniture frequently and wear a mask while doing it.  Vacuum floors weekly.  Remove stuffed animals or extra pillows from the bed.  Clean bedding in hot water weekly.  Sometimes allergies can be so  severe that 2 nasal sprays and 2 pills may be needed to control symptoms.

## 2012-09-04 NOTE — Progress Notes (Signed)
Patient being seen today for lab results and blood pressure check. Patient has first physical therapy appointment for hip surgery she had two years ago.

## 2012-09-04 NOTE — Patient Instructions (Signed)
DASH Diet: After Your Visit  Your Care Instructions  The DASH diet is an eating plan that can help lower your blood pressure. DASH stands for Dietary Approaches to Stop Hypertension. Hypertension is high blood pressure.  The DASH diet focuses on eating foods that are high in calcium, potassium, and magnesium. These nutrients can lower blood pressure. The foods that are highest in these nutrients are fruits, vegetables, low-fat dairy products, nuts, seeds, and legumes. But taking calcium, potassium, and magnesium supplements instead of eating foods that are high in those nutrients does not have the same effect. The DASH diet also includes whole grains, fish, and poultry.  The DASH diet is one of several lifestyle changes your doctor may recommend to lower your high blood pressure. Your doctor may also want you to decrease the amount of sodium in your diet. Lowering sodium while following the DASH diet can lower blood pressure even further than just the DASH diet alone.  Follow-up care is a key part of your treatment and safety. Be sure to make and go to all appointments, and call your doctor if you are having problems. It's also a good idea to know your test results and keep a list of the medicines you take.  How can you care for yourself at home?  Following the DASH diet  ?? Eat 4 to 5 servings of fruit each day. A serving is 1 medium-sized piece of fruit, ?? cup chopped or canned fruit, 1/4 cup dried fruit, or 4 ounces (?? cup) of fruit juice. Choose fruit more often than fruit juice.  ?? Eat 4 to 5 servings of vegetables each day. A serving is 1 cup of lettuce or raw leafy vegetables, ?? cup of chopped or cooked vegetables, or 4 ounces (?? cup) of vegetable juice. Choose vegetables more often than vegetable juice.  ?? Get 2 to 3 servings of low-fat and fat-free dairy each day. A serving is 8 ounces of milk, 1 cup of yogurt, or 1 ?? ounces of cheese.  ?? Eat 6 to 8 servings of grains each day. A serving is 1 slice of  bread, 1 ounce of dry cereal, or ?? cup of cooked rice, pasta, or cooked cereal. Try to choose whole-grain products as much as possible.  ?? Limit lean meat, poultry, and fish to 2 servings each day. A serving is 3 ounces, about the size of a deck of cards.  ?? Eat 4 to 5 servings of nuts, seeds, and legumes (cooked dried beans, lentils, and split peas) each week. A serving is 1/3 cup of nuts, 2 tablespoons of seeds, or ?? cup cooked dried beans or peas.  ?? Limit fats and oils to 2 to 3 servings each day. A serving is 1 teaspoon of vegetable oil or 2 tablespoons of salad dressing.  ?? Limit sweets and added sugars to 5 servings or less a week. A serving is 1 tablespoon jelly or jam, ?? cup sorbet, or 1 cup of lemonade.  ?? Eat less than 2,300 milligrams (mg) of sodium a day. If you have high blood pressure, diabetes, or chronic kidney disease, if you are African-American, or if you are older than age 50, try to limit the amount of sodium you eat to less than 1,500 mg a day.  Tips for success  ?? Start small. Do not try to make dramatic changes to your diet all at once. You might feel that you are missing out on your favorite foods and then be more   likely to not follow the plan. Make small changes, and stick with them. Once those changes become habit, add a few more changes.  ?? Try some of the following:  ?? Make it a goal to eat a fruit or vegetable at every meal and at snacks. This will make it easy to get the recommended amount of fruits and vegetables each day.  ?? Try yogurt topped with fruit and nuts for a snack or healthy dessert.  ?? Add lettuce, tomato, cucumber, and onion to sandwiches.  ?? Combine a ready-made pizza crust with low-fat mozzarella cheese and lots of vegetable toppings. Try using tomatoes, squash, spinach, broccoli, carrots, cauliflower, and onions.  ?? Have a variety of cut-up vegetables with a low-fat dip as an appetizer instead of chips and dip.  ?? Sprinkle sunflower seeds or chopped almonds over  salads. Or try adding chopped walnuts or almonds to cooked vegetables.  ?? Try some vegetarian meals using beans and peas. Add garbanzo or kidney beans to salads. Make burritos and tacos with mashed pinto beans or black beans.    Where can you learn more?    Go to [email or web address]   Enter H967 in the search box to learn more about "DASH Diet: After Your Visit."    ?? 2006-2013 [employer]. Care instructions adapted under license by [employer] (which disclaims liability or warranty for this information). This care instruction is for use with your licensed healthcare professional. If you have questions about a medical condition or this instruction, always ask your healthcare professional. [employer] disclaims any warranty or liability for your use of this information.  Content Version: 9.8.193578; Last Revised: Feb 21, 2012                Hip Arthritis: Exercises  Your Care Instructions  Here are some examples of exercises for hip arthritis. Start each exercise slowly. Ease off the exercise if you start to have pain.  Your doctor or physical therapist will tell you when you can start these exercises and which ones will work best for you.  How to do the exercises  Straight-leg raises to the outside    1. Lie on your side, with your affected hip on top.  2. Tighten the front thigh muscles of your top leg to keep your knee straight.  3. Keep your hip and your leg straight in line with the rest of your body, and keep your knee pointing forward. Do not drop your hip back.  4. Lift your top leg straight up toward the ceiling, about 12 inches off the floor. Hold for about 6 seconds, then slowly lower your leg.  5. Repeat 8 to 12 times.  6. Switch legs and repeat steps 1 through 5, even if only one hip is sore.  Straight-leg raises to the inside    1. Lie on your side with your affected hip on the floor.  2. You can either prop up your other leg on a chair, or you can bend that  knee and put that foot in front of your other knee. Do not drop your hip back.  3. Tighten the muscles on the front thigh of your bottom leg to straighten that knee.  4. Keep your kneecap pointing forward and your leg straight, and lift your bottom leg up toward the ceiling about 6 inches. Hold for about 6 seconds, then lower slowly.  5. Repeat 8 to 12 times.  6. Switch legs and repeat steps 1 through 5,  even if only one hip is sore.  Hip hike    1. Stand sideways on the bottom step of a staircase, and hold on to the banister or wall.  2. Keeping both knees straight, lift your good leg off the step and let it hang down. Then hike your good hip up to the same level as your affected hip or a little higher.  3. Repeat 8 to 12 times.  4. Switch legs and repeat steps 1 through 3, even if only one hip is sore.  Bridging    1. Lie on your back with both knees bent. Your knees should be bent about 90 degrees.  2. Then push your feet into the floor, squeeze your buttocks, and lift your hips off the floor until your shoulders, hips, and knees are all in a straight line.  3. Hold for about 6 seconds as you continue to breathe normally, and then slowly lower your hips back down to the floor and rest for up to 10 seconds.  4. Repeat 8 to 12 times.  Hamstring stretch (lying down)    1. Lie flat on your back with your legs straight. If you feel discomfort in your back, place a small towel roll under your lower back.  2. Holding the back of your affected leg, lift your leg straight up and toward your body until you feel a stretch at the back of your thigh.  3. Hold the stretch for at least 30 seconds.  4. Repeat 2 to 4 times.  5. Switch legs and repeat steps 1 through 4, even if only one hip is sore.  Standing quadriceps stretch    1. If you are not steady on your feet, hold on to a chair, counter, or wall. You can also lie on your stomach or your side to do this exercise.  2. Bend the knee of the leg you want to stretch, and reach  behind you to grab the front of your foot or ankle with the hand on the same side. For example, if you are stretching your right leg, use your right hand.  3. Keeping your knees next to each other, pull your foot toward your buttock until you feel a gentle stretch across the front of your hip and down the front of your thigh. Your knee should be pointed directly to the ground, and not out to the side.  4. Hold the stretch for at least 15 to 30 seconds.  5. Repeat 2 to 4 times.  6. Switch legs and repeat steps 1 through 5, even if only one hip is sore.  Hip rotator stretch    1. Lie on your back with both knees bent and your feet flat on the floor.  2. Put the ankle of your affected leg on your opposite thigh near your knee.  3. Use your hand to gently push your knee away from your body until you feel a gentle stretch around your hip.  4. Hold the stretch for 15 to 30 seconds.  5. Repeat 2 to 4 times.  6. Repeat steps 1 through 5, but this time use your hand to gently pull your knee toward your opposite shoulder.  7. Switch legs and repeat steps 1 through 6, even if only one hip is sore.  Knee-to-chest    1. Lie on your back with your knees bent and your feet flat on the floor.  2. Bring your affected leg to your chest, keeping the other foot  flat on the floor (or keeping the other leg straight, whichever feels better on your lower back).  3. Keep your lower back pressed to the floor. Hold for at least 15 to 30 seconds.  4. Relax, and lower the knee to the starting position.  5. Repeat 2 to 4 times.  6. Switch legs and repeat steps 1 through 5, even if only one hip is sore.  7. To get more stretch, put your other leg flat on the floor while pulling your knee to your chest.  Clamshell    1. Lie on your side, with your affected hip on top. Keep your feet and knees together and your knees bent.  2. Raise your top knee, but keep your feet together. Do not let your hips roll back. Your legs should open up like a  clamshell.  3. Hold for 6 seconds.  4. Slowly lower your knee back down. Rest for 10 seconds.  5. Repeat 8 to 12 times.  6. Switch legs and repeat steps 1 through 5, even if only one hip is sore.  Follow-up care is a key part of your treatment and safety. Be sure to make and go to all appointments, and call your doctor if you are having problems. It's also a good idea to know your test results and keep a list of the medicines you take.    Where can you learn more?    Go to [email or web address]   Enter [phone] in the search box to learn more about "Hip Arthritis: Exercises."    ?? 2006-2013 [employer]. Care instructions adapted under license by [employer] (which disclaims liability or warranty for this information). This care instruction is for use with your licensed healthcare professional. If you have questions about a medical condition or this instruction, always ask your healthcare professional. [employer] disclaims any warranty or liability for your use of this information.  Content Version: 9.8.193578; Last Revised: August 30, 2010                Hyperlipidemia: After Your Visit  Your Care Instructions  Hyperlipidemia is too much fat in your blood. The body has several kinds of fat, including cholesterol and triglycerides. Your body needs fat for many things, such as making new cells. But too much fat in your blood increases your chances of having a heart attack or stroke.  You may be able to lower your cholesterol and triglycerides with a heart-healthy diet, exercise, and if needed, medicine. Your doctor may want you to try lifestyle changes first to see whether they lower the fat in your blood. You may need to take medicine if lifestyle changes do not lower the fat in your blood enough.  Follow-up care is a key part of your treatment and safety. Be sure to make and go to all appointments, and call your doctor if you are having problems. It???s also a good idea to know  your test results and keep a list of the medicines you take.  How can you care for yourself at home?  Take your medicines  ?? Take your medicines exactly as prescribed. Call your doctor if you think you are having a problem with your medicine.  ?? If you take medicine to lower your cholesterol, go to follow-up visits. You will need to have blood tests.  ?? Do not take large doses of niacin, which is a B vitamin, while taking medicine called statins. It may increase the chance of muscle pain and  liver problems.  ?? Talk to your doctor about avoiding grapefruit juice if you are taking statins. Grapefruit juice can raise the level of this medicine in your blood. This could increase side effects.  Eat more fruits, vegetables, and fiber  ?? Fruits and vegetables have lots of nutrients that help protect against heart disease, and they have little???if any???fat. Try to eat at least five servings a day. Dark green, deep orange, or yellow fruits and vegetables are healthy choices.  ?? Keep carrots, celery, and other veggies handy for snacks. Buy fruit that is in season and store it where you can see it so that you will be tempted to eat it. Cook dishes that have a lot of veggies in them, such as stir-fries and soups.  ?? Foods high in fiber may reduce your cholesterol and provide important vitamins and minerals. High-fiber foods include whole-grain cereals and breads, oatmeal, beans, brown rice, citrus fruits, and apples.  ?? Buy whole-grain breads and cereals instead of white bread and pastries.  Limit saturated fat  ?? Read food labels and try to avoid saturated fat and trans fat. They increase your risk of heart disease.  ?? Use olive or canola oil when you cook. Try cholesterol-lowering spreads, such as Benecol or Take Control.  ?? Bake, broil, grill, or steam foods instead of frying them.  ?? Limit the amount of high-fat meats you eat, including hot dogs and sausages. Cut out all visible fat when you prepare meat.  ?? Eat fish,  skinless poultry, and soy products such as tofu instead of high-fat meats. Soybeans may be especially good for your heart. Eat at least two servings of fish a week. Certain fish, such as salmon, contain omega-3 fatty acids, which may help reduce your risk of heart attack.  ?? Choose low-fat or fat-free milk and dairy products.  Get exercise, limit alcohol, and quit smoking  ?? Get more exercise. Work with your doctor to set up an exercise program. Even if you can do only a small amount, exercise will help you get stronger, have more energy, and manage your weight and your stress. Walking is an easy way to get exercise. Gradually increase the amount you walk every day. Aim for at least 30 minutes on most days of the week. You also may want to swim, bike, or do other activities.  ?? Limit alcohol to no more than 2 drinks a day for men and 1 drink a day for women.  ?? Do not smoke. If you need help quitting, talk to your doctor about stop-smoking programs and medicines. These can increase your chances of quitting for good.  When should you call for help?  Call 911 anytime you think you may need emergency care. For example, call if:  ?? You have symptoms of a heart attack. These may include:  ?? Chest pain or pressure, or a strange feeling in the chest.  ?? Sweating.  ?? Shortness of breath.  ?? Nausea or vomiting.  ?? Pain, pressure, or a strange feeling in the back, neck, jaw, or upper belly or in one or both shoulders or arms.  ?? Lightheadedness or sudden weakness.  ?? A fast or irregular heartbeat.  After you call 911, the operator may tell you to chew 1 adult-strength or 2 to 4 low-dose aspirin. Wait for an ambulance. Do not try to drive yourself.  ?? You have signs of a stroke. These may include:  ?? Sudden numbness, paralysis, or weakness in your  face, arm, or leg, especially on only one side of your body.  ?? New problems with walking or balance.  ?? Sudden vision changes.  ?? Drooling or slurred speech.  ?? New problems  speaking or understanding simple statements, or feeling confused.  ?? A sudden, severe headache that is different from past headaches.  ?? You passed out (lost consciousness).  Call your doctor now or seek immediate medical care if:  ?? You have muscle pain or weakness.  Watch closely for changes in your health, and be sure to contact your doctor if:  ?? You are very tired.  ?? You have an upset stomach, gas, constipation, or belly pain or cramps.    Where can you learn more?    Go to [email or web address]   Enter C406 in the search box to learn more about "Hyperlipidemia: After Your Visit."    ?? 2006-2013 [employer]. Care instructions adapted under license by [employer] (which disclaims liability or warranty for this information). This care instruction is for use with your licensed healthcare professional. If you have questions about a medical condition or this instruction, always ask your healthcare professional. [employer] disclaims any warranty or liability for your use of this information.  Content Version: 9.8.193578; Last Revised: July 28, 2010                Saline Nasal Washes: After Your Visit  Your Care Instructions  Saline nasal washes help keep the nasal passages open by washing out thick or dried mucus. This simple remedy can help relieve symptoms of allergies, sinusitis, and colds. It also can make the nose feel more comfortable by keeping the mucous membranes moist. You may notice a little burning sensation in your nose the first few times you use the solution, but this usually gets better in a few days.  Follow-up care is a key part of your treatment and safety. Be sure to make and go to all appointments, and call your doctor if you are having problems. It???s also a good idea to know your test results and keep a list of the medicines you take.  How can you care for yourself at home?  ?? You can buy premixed saline solution in a squeeze bottle or other sinus  rinse products at a drugstore. Read and follow the instructions on the label.  ?? You also can make your own saline solution by adding 1 teaspoon of salt and 1 teaspoon of baking soda to 2 cups of distilled water.  ?? If you use a homemade solution, pour a small amount into a clean bowl. Using a rubber bulb syringe, squeeze the syringe and place the tip in the salt water. Pull a small amount of the salt water into the syringe by relaxing your hand.  ?? Sit down with your head tilted slightly back. Do not lie down. Put the tip of the bulb syringe or the squeeze bottle a little way into one of your nostrils. Gently squirt a small amount (about 1 teaspoon) into the nostril. Repeat with the other nostril. Some sneezing and gagging are normal at first.  ?? Gently blow your nose.  ?? Wipe the syringe or bottle tip clean after each use.  ?? Repeat this 2 or 3 times a day.  ?? Use nasal washes gently if you have nosebleeds often.  When should you call for help?  Watch closely for changes in your health, and be sure to contact your doctor if:  ?? You  often get nosebleeds.  ?? You have problems doing the nasal washes.    Where can you learn more?    Go to [email or web address]   Enter B784 in the search box to learn more about "Saline Nasal Washes: After Your Visit."    ?? 2006-2013 [employer]. Care instructions adapted under license by [employer] (which disclaims liability or warranty for this information). This care instruction is for use with your licensed healthcare professional. If you have questions about a medical condition or this instruction, always ask your healthcare professional. [employer] disclaims any warranty or liability for your use of this information.  Content Version: 9.8.193578; Last Revised: November 28, 2010           Advised protocol for clearing congestion:  Increase fluid intake, especially water to thin mucous and boost the immune system.  Avoid sugar and dairy  while congested since they thicken mucous.  Get plenty of rest!  Gargle 3 times daily and as needed in Listerine or warm salt water vinegar solutions (1 tsp salt, 1 tsp vinegar in 1 cup lukewarm water.)  Use OTC nasal saline spray up each nostril twice daily.  Use humidifier at bedtime.  Use OTC Mucinex 600 mg twice daily to loosen mucous.    Use OTC Tylenol Arthritis or Ibuprofen up to 800 mg up to 3 times daily as needed for pain, fever or headaches.  Avoid decongestants and Ibuprofen if you have high blood pressure!  If mucous is consistently discolored yellow or green throughout the day for more than a week, call the doctor for an evaluation.    Advise patient to start taking OTC allergy medication (Allegra, Zyrtec, Claritin, or Alavert) daily.  Use allergy free, dye free, fragrance free products on your body and hair.  After being outdoors, brush hair vigorously, wash face and arms, rinse nostrils with a nasal saline spray and consider changing your clothing.  Dust furniture frequently and wear a mask while doing it.  Vacuum floors weekly.  Remove stuffed animals or extra pillows from the bed.  Clean bedding in hot water weekly.  Sometimes allergies can be so severe that 2 nasal sprays and 2 pills may be needed to control symptoms.

## 2012-09-05 LAB — URINALYSIS W/ RFLX MICROSCOPIC
Bilirubin: NEGATIVE
Glucose: NEGATIVE
Ketone: NEGATIVE
Leukocyte Esterase: NEGATIVE
Nitrites: NEGATIVE
Protein: NEGATIVE
Specific Gravity: 1.017 (ref 1.005–1.030)
Urobilinogen: 0.2 mg/dL (ref 0.0–1.9)
pH (UA): 5.5 (ref 5.0–7.5)

## 2012-09-05 LAB — MICROSCOPIC EXAMINATION

## 2012-09-06 DIAGNOSIS — M545 Low back pain, unspecified: Secondary | ICD-10-CM | POA: Diagnosis not present

## 2012-09-06 LAB — CULTURE, URINE
Urine Culture, Routine: NO GROWTH
Urine Culture, Routine: NO GROWTH

## 2012-09-06 NOTE — Progress Notes (Signed)
Quick Note:    Spoke with patient and she has no symptoms.  ______

## 2012-09-06 NOTE — Progress Notes (Signed)
Quick Note:    Blood is in your urine. Please notify our office if you are experiencing fever, chills, lower abdominal pain, frequent urination, pain with urination or back pain. Otherwise, recheck urine at your next appointment.  No UTI.  ______

## 2012-09-09 DIAGNOSIS — M545 Low back pain, unspecified: Secondary | ICD-10-CM | POA: Diagnosis not present

## 2012-09-09 DIAGNOSIS — M5430 Sciatica, unspecified side: Secondary | ICD-10-CM | POA: Diagnosis not present

## 2012-09-16 DIAGNOSIS — M545 Low back pain, unspecified: Secondary | ICD-10-CM | POA: Diagnosis not present

## 2012-09-20 DIAGNOSIS — M7530 Calcific tendinitis of unspecified shoulder: Secondary | ICD-10-CM | POA: Diagnosis not present

## 2012-09-20 DIAGNOSIS — M25519 Pain in unspecified shoulder: Secondary | ICD-10-CM | POA: Diagnosis not present

## 2012-09-25 DIAGNOSIS — M5430 Sciatica, unspecified side: Secondary | ICD-10-CM | POA: Diagnosis not present

## 2012-09-30 DIAGNOSIS — H01029 Squamous blepharitis unspecified eye, unspecified eyelid: Secondary | ICD-10-CM | POA: Diagnosis not present

## 2012-10-14 DIAGNOSIS — M545 Low back pain, unspecified: Secondary | ICD-10-CM | POA: Diagnosis not present

## 2012-10-14 DIAGNOSIS — M5430 Sciatica, unspecified side: Secondary | ICD-10-CM | POA: Diagnosis not present

## 2012-10-14 DIAGNOSIS — M7530 Calcific tendinitis of unspecified shoulder: Secondary | ICD-10-CM | POA: Diagnosis not present

## 2012-10-14 DIAGNOSIS — M25519 Pain in unspecified shoulder: Secondary | ICD-10-CM | POA: Diagnosis not present

## 2012-10-15 DIAGNOSIS — Z1231 Encounter for screening mammogram for malignant neoplasm of breast: Secondary | ICD-10-CM | POA: Diagnosis not present

## 2012-10-16 LAB — HM MAMMOGRAPHY

## 2012-10-23 NOTE — Telephone Encounter (Signed)
Patient contacted by [facility] stating Diovan 160 mgs.,  is no longer available for her thru her insurance plan.  Patient will need authorization from Dr. Harle Stanford that this med is medically necessary for her, as this works well for her, due to family history.  [facility] [phone]. Patient has 1 week of Diovan remaining.

## 2012-10-24 ENCOUNTER — Telehealth

## 2012-10-24 NOTE — Telephone Encounter (Signed)
See previous message from today, routed to Dr. Harle Stanford.

## 2012-10-24 NOTE — Telephone Encounter (Signed)
Pt has appt 12/02/12 for BP check on new medication. . Advised if BP's run over 140/90 consistently or under 90/60 consistently, needs sooner appt. Advised to bring BP log to [facility]. Will check BP 3 times per week.

## 2012-10-24 NOTE — Telephone Encounter (Signed)
[facility] faxed a notice that Diovan is not covered on patient's formulary. Gave covered alternatives:  Benicar, Losartan Potassium, Irbesartan. States would like to try Benicar, if doctor agrees.  Does she need to wean off of Diovan or just change?  (Pt states also takes Lasix 20 mg daily).  Please advise. Wants Rx sent to [facility].

## 2012-10-25 NOTE — Telephone Encounter (Signed)
Ok to stop Lasix 20 mg.  Change Diovan 160 mg to Diovan HCT 160/12.5 mg daily.  Increase oral potassium.

## 2012-10-25 NOTE — Telephone Encounter (Signed)
Ok to change Diovan 160 mg to Benicar 20 mg daily.  Nurse will send Rx.

## 2012-10-25 NOTE — Telephone Encounter (Signed)
[facility] sent notification that Benicar requires Prior Authorization. (a letter of denial for Diovan stated Benicar is a covered medicaton). Called the prior auth department and was told Benicar would be covered only if she tried a generic ARB or ACE. Called the patient. She said that the pharmacist called her to say that Diovan/hct is covered, as is generic. Pt states is willing to try this and get off of Lasix.  Please advise.

## 2012-10-25 NOTE — Telephone Encounter (Signed)
Please call in BP medication for [facility]  Benicar is not on formulary.  The only thing she states in on the formulary is Valsartan/HCTZ. Or Verapramil HCTZ.  This is new information as of this am. 10/25/2012.

## 2012-10-25 NOTE — Telephone Encounter (Signed)
Per Dr. Harle Stanford, Rx sent to [facility] for Benicar 20mg  #90 w/0 refills.

## 2012-10-28 NOTE — Telephone Encounter (Signed)
Issues have been on going over the past week regarding patient's medication. Patient now has found that her insurance company will approve Benicar.  Pharmacist will fax over for a pre-auth for the Benicar.  The other two meds that the insurance company will approve are Losartan and Ivasartan.  Of course, the patient would like Dr. Harle Stanford to approve whichever one she thinks is best.  Patient [phone], [phone].

## 2012-10-28 NOTE — Telephone Encounter (Signed)
Spoke with patient.  Rx called to Pharmacy of choice.

## 2012-10-28 NOTE — Telephone Encounter (Signed)
Pharmacist said #90 generic Diovan/HCT is covered by the insurance for #21 copay. Msg left on pt's VM.

## 2012-10-28 NOTE — Telephone Encounter (Signed)
Please re-prescribe Benicar HCT 20/25 mg (instead of Diovan HCT?) as previously ordered.

## 2012-10-28 NOTE — Telephone Encounter (Signed)
Rx has been called in for gen. Diovan/HCT.

## 2012-12-09 DIAGNOSIS — I10 Essential (primary) hypertension: Secondary | ICD-10-CM | POA: Diagnosis not present

## 2012-12-09 DIAGNOSIS — R946 Abnormal results of thyroid function studies: Secondary | ICD-10-CM | POA: Diagnosis not present

## 2012-12-09 NOTE — Progress Notes (Signed)
CCP: BP check    S: Kathryn [name] is a 77 y.o. female who presents for BP check. Was recently changed from Diovan to generic. Was changed about a month ago. She checks her BP at home. In the past month or so has been 120-130s/80s. No hypertensive signs.     Recently had lab work done which was normal except for a low TSH.     Denies cardiac complaints including chest pain or discomfort, elevated heart rate, or palpitations. Denies any headache, vision changes, numbness and tingling or weakness in her extremities. Denies respiratory complaints including SOB, difficulty or pain with breathing, wheezes, and cough. Feels well and ROS is otherwise negative.     Past Medical History   Diagnosis Date   ??? Edema      left ankle   ??? DDD (degenerative disc disease) 10/1999     L 4-5 by MRI.  Dr. Shiela Mayer.  Dr. Robie Ridge, Pain   ??? Urinary incontinence, stress      mild.  Dr. Winnifred Friar   ??? Polyp of rectum      Dr. Reed Pandy   ??? Cystocele      Dr. Winnifred Friar   ??? Postmenopausal HRT (hormone replacement therapy)      Hx  for 6 years.  Dr. Lavone Nian   ??? Morton's neuroma 2000     Left.  Dr. Gillis Santa   ??? Hematuria of undiagnosed cause 1980, 2009   ??? Single renal cyst 02/12/2008     Left.  0.9 cm   ??? Subchondral cysts 2009     Left hip femoral heads.  Dr. Adah Perl   ??? DJD (degenerative joint disease) 05/26/04     Dr. Shiela Mayer.   ??? Hypercholesterolemia    ??? Hypertension    ??? Kidney stone 12/2002     Dr. Gillis Ends   ??? Chickenpox childhood   ??? Rotator cuff tendinitis 12/20/04     Right.  Dr. Jen Mow   ??? Benign positional vertigo 01/07/04     Dr. Minette Headland   ??? SOB (shortness of breath) 07/11/04     Dr. Abigail Miyamoto.  Neg Stress Cardiolite.  EF 60 %.   ??? Unspecified vitamin D deficiency 2008   ??? Herpes zoster 02/26/07, 06/21/07, 08/27/07     Left buttocks then Right T2-3 dermatome   ??? Herpes simplex type 2 infection 10/05/08     Left buttocks   ??? Cataract 2002     Dr. Theodoro Grist   ??? Multinodular goiter  10/2009     Dr. Lewayne Bunting.  benign.   ??? Internal carotid artery stenosis 09/18/11     Left.  16-49%.     ??? Basal cell cancer 02/2012     left lower leg.  Left foot.  Dr. Tilden Fossa.   ??? Near syncope 02/26/12     Negative Stress Test.  due to low blood sugar.  Dr. Meredith Mody   ??? Osteopenia 06/27/12     [facility]   ??? Hip pain [id number]     Right.  due to DJD.  Dr. Birdena Crandall.   ??? Facial pain 2013     due to sinusitis.  Dr. Claria Dice.        Current Outpatient Prescriptions   Medication Sig Dispense Refill   ??? pravastatin (PRAVACHOL) 80 mg tablet TAKE 1 TABLET BY MOUTH EVERY DAY  90 Tab  3   ??? valsartan-hydrochlorothiazide (DIOVAN-HCT)  160-12.5 mg per tablet Take 1 Tab by mouth daily.  90 Tab  1   ??? valACYclovir (VALTREX) 500 mg tablet Take 1 Tab by mouth daily. For HSV  90 Tab  3   ??? EVISTA 60 mg tablet TAKE 1 TABLET EVERY DAY FOR BONES  90 Tab  3   ??? aspirin (ASPIRIN) 325 mg tablet Take 325 mg by mouth daily.         ??? calcium-cholecalciferol, d3, (CALCIUM 600 + D) 600-125 mg-unit Tab Take  by mouth.         ??? calcium 500 mg Tab Take  by mouth.         ??? magnesium 250 mg Tab Take  by mouth. Unsure of exact strength        ??? b complex vitamins tablet Take 1 Tab by mouth daily.         ??? ascorbic acid (VITAMIN C) 500 mg tablet Take 500 mg by mouth nightly.       ??? multivitamin (ONE A DAY) tablet Take 1 Tab by mouth daily.       ??? mometasone (NASONEX) 50 mcg/actuation nasal spray 2 Sprays by Both Nostrils route daily.  1 Container  0   ??? [DISCONTINUED] valACYclovir (VALTREX) 500 mg tablet TAKE 1 TAB BY MOUTH DAILY. FOR HSV  90 Tab  3       Allergies   Allergen Reactions   ??? Other Medication Rash     All cillins   ??? Penicillins Rash and Swelling   ??? Tetracycline Itching   ??? Bactrim (Sulfamethoprim Ds) Itching   ??? Celebrex (Celecoxib) Other (comments)     Memory disturbance, slowed thinking   ??? Crestor (Rosuvastatin) Other (comments)     Elevated CPK   ??? Lipitor  (Atorvastatin) Other (comments)     Leg cramps   ??? Niaspan (Niacin) Other (comments)     Caused elevated BP   ??? Zocor (Simvastatin) Other (comments)     Leg cramps        Agree with nurses note.    O: VS: BP 110/60   Pulse 79   Temp(Src) 97.5 ??F (36.4 ??C)   Ht 5' 1.8" (1.57 m)   Wt 155 lb (70.308 kg)   BMI 28.52 kg/m2   SpO2 99%  PAIN: No complaints of pain today.  GENERAL: Kathryn [name] is sitting in the chair in NAD.       A:   1. Essential hypertension, benign  TSH, 3RD GENERATION, T3 TOTAL, T4, FREE   2. Low TSH level  TSH, 3RD GENERATION, T3 TOTAL, T4, FREE        P: BP stable on current medication, continue with current treatment plan  Will recheck TSH, T3 and T4 in next couple weeks. Has a history of multinodular goiter, will consult with Dr. Daryel November if abnormal  Follow up for lab work

## 2012-12-09 NOTE — Progress Notes (Signed)
Chief Complaint   Patient presents with   ??? Blood Pressure Check   ??? Medication Evaluation     Patient states that she is taking medication as directed. Patient denies any complaints. Patient given [facility] Care Booklet.

## 2012-12-24 DIAGNOSIS — R946 Abnormal results of thyroid function studies: Secondary | ICD-10-CM | POA: Diagnosis not present

## 2012-12-24 DIAGNOSIS — I10 Essential (primary) hypertension: Secondary | ICD-10-CM | POA: Diagnosis not present

## 2012-12-25 LAB — T4, FREE: T4, Free: 1.26 ng/dL (ref 0.82–1.77)

## 2012-12-25 LAB — TSH 3RD GENERATION: TSH: 0.201 u[IU]/mL — ABNORMAL LOW (ref 0.450–4.500)

## 2012-12-25 LAB — T3 TOTAL: T3, total: 123 ng/dL (ref 71–180)

## 2013-01-01 NOTE — Telephone Encounter (Signed)
Patient is calling for lab results for thyroid.  She cannot access the information on "My Chart" and would like to speak to someone about the results.  She states this information is to be shared with Dr. Lewayne Bunting.  Please call the patient to discuss.  Patient [phone].

## 2013-01-01 NOTE — Telephone Encounter (Signed)
Labs need to be reviewed.

## 2013-01-02 ENCOUNTER — Encounter

## 2013-01-02 NOTE — Telephone Encounter (Signed)
Lm on Vm for patient to return call to [facility]/ Malvin Johns, FNP in regards to lab results.

## 2013-01-02 NOTE — Telephone Encounter (Signed)
Patient notified of lab results and patient has appointment with Dr. Lewayne Bunting. Patient requested Result Letter be mailed please. Patient verbalized understanding.  Bonita Quin if this result is in your in basket please print and mail letter please.

## 2013-01-02 NOTE — Telephone Encounter (Signed)
Please call. Results have been sent to Dr. Adriana Simas. We are waiting to hear back with his recommendations.

## 2013-01-02 NOTE — Telephone Encounter (Signed)
No result letter in chart. Mailed copy of labs to patient.

## 2013-01-06 NOTE — Progress Notes (Signed)
Quick Note:    Reviewed lab results with Dr. Daryel November via [facility]. He will contact patient to schedule follow up.  ______

## 2013-01-13 ENCOUNTER — Encounter

## 2013-01-14 NOTE — Telephone Encounter (Signed)
Message copied by Neldon Labella on Tue Jan 14, 2013 10:05 AM  ------       Message from: Lewayne Bunting D       Created: Mon Jan 13, 2013  8:32 PM         These labs were ordered              ----- Message -----          From: Hanley Ben, LPN          Sent: 01/02/2013  10:23 AM            To: Myrna Blazer, MD              I called patient, she is in agreement with having thyroid uptake scan and ultrasound prior to her visit.  I told her she will receive a call from Debbie to schedule this.        ----- Message -----          From: Myrna Blazer, MD          Sent: 01/02/2013   9:54 AM            To: Hanley Ben, LPN              Considering her planned move, please see if she would be willing to have a thyroid uptake scan and ultrasound too done prior to her visit to evaluate nodules and hyperthyroidism.  This way we'd have all the information we need at her visit.       ----- Message -----          From: Hanley Ben, LPN          Sent: 01/02/2013   9:43 AM            To: Myrna Blazer, MD              I called patient, she will be moving to [facility] this summer so we scheduled her visit for 04/10/13 (3 months).   I mailed a lab order to her home address for pre labs.        ----- Message -----          From: Myrna Blazer, MD          Sent: 01/02/2013   9:27 AM            To: Hanley Ben, LPN              Malvin Johns, NP at [facility] sent me Mrs Kaufmann recent thyroid tests.       She is mildly hyperthyroid and has thyroid nodules.  I saw her about 18 months ago.        Please call her to schedule follow-up visit in 4-6 months with labs prior - TSH, Free T4.         Thank you                              ------

## 2013-02-05 DIAGNOSIS — M719 Bursopathy, unspecified: Secondary | ICD-10-CM | POA: Diagnosis not present

## 2013-02-05 DIAGNOSIS — M67919 Unspecified disorder of synovium and tendon, unspecified shoulder: Secondary | ICD-10-CM | POA: Diagnosis not present

## 2013-03-07 DIAGNOSIS — I10 Essential (primary) hypertension: Secondary | ICD-10-CM | POA: Diagnosis not present

## 2013-03-07 DIAGNOSIS — E7800 Pure hypercholesterolemia, unspecified: Secondary | ICD-10-CM | POA: Diagnosis not present

## 2013-03-07 DIAGNOSIS — R7309 Other abnormal glucose: Secondary | ICD-10-CM | POA: Diagnosis not present

## 2013-03-08 LAB — MICROSCOPIC EXAMINATION

## 2013-03-08 LAB — URINALYSIS W/ RFLX MICROSCOPIC
Bilirubin: NEGATIVE
Blood: NEGATIVE
Glucose: NEGATIVE
Ketone: NEGATIVE
Nitrites: NEGATIVE
Specific Gravity: 1.023 (ref 1.005–1.030)
Urobilinogen: 0.2 mg/dL (ref 0.0–1.9)
pH (UA): 6 (ref 5.0–7.5)

## 2013-03-09 LAB — NMR LIPOPROFILE WITH LIPIDS (WITHOUT GRAPH)
Cholesterol, Total: 150 mg/dL (ref <200)
HDL-C: 52 mg/dL (ref >=40)
HDL-P (Total): 39.5 umol/L (ref >=30.5)
LDL size: 20.6 nm (ref >20.5)
LDL-C: 76 mg/dL (ref <100)
LDL-P: 1270 nmol/L — ABNORMAL HIGH (ref <1000)
LP-IR SCORE: 53 — ABNORMAL HIGH (ref <=45)
Small LDL-P: 664 nmol/L — ABNORMAL HIGH (ref <=527)
Triglycerides: 110 mg/dL (ref <150)

## 2013-03-09 LAB — METABOLIC PANEL, COMPREHENSIVE
A-G Ratio: 2 (ref 1.1–2.5)
ALT (SGPT): 19 IU/L (ref 0–32)
AST (SGOT): 28 IU/L (ref 0–40)
Albumin: 4.1 g/dL (ref 3.5–4.8)
Alk. phosphatase: 50 IU/L (ref 39–117)
BUN/Creatinine ratio: 21 (ref 11–26)
BUN: 15 mg/dL (ref 8–27)
Bilirubin, total: 0.3 mg/dL (ref 0.0–1.2)
CO2: 27 mmol/L (ref 19–28)
Calcium: 9.4 mg/dL (ref 8.6–10.2)
Chloride: 106 mmol/L (ref 97–108)
Creatinine: 0.71 mg/dL (ref 0.57–1.00)
GFR est AA: 96 mL/min/{1.73_m2} (ref >59)
GFR est non-AA: 84 mL/min/{1.73_m2} (ref >59)
GLOBULIN, TOTAL: 2.1 g/dL (ref 1.5–4.5)
Glucose: 83 mg/dL (ref 65–99)
Potassium: 3.5 mmol/L (ref 3.5–5.2)
Protein, total: 6.2 g/dL (ref 6.0–8.5)
Sodium: 142 mmol/L (ref 134–144)

## 2013-03-09 LAB — HEMOGLOBIN A1C WITH EAG: Hemoglobin A1c: 6.2 % — ABNORMAL HIGH (ref 4.8–5.6)

## 2013-03-14 DIAGNOSIS — E7800 Pure hypercholesterolemia, unspecified: Secondary | ICD-10-CM | POA: Diagnosis not present

## 2013-03-14 DIAGNOSIS — I10 Essential (primary) hypertension: Secondary | ICD-10-CM | POA: Diagnosis not present

## 2013-03-14 DIAGNOSIS — R32 Unspecified urinary incontinence: Secondary | ICD-10-CM | POA: Diagnosis not present

## 2013-03-14 DIAGNOSIS — B009 Herpesviral infection, unspecified: Secondary | ICD-10-CM | POA: Diagnosis not present

## 2013-03-14 NOTE — Patient Instructions (Signed)
Bladder Training: After Your Visit  Your Care Instructions  Bladder training is used to treat urge incontinence and stress incontinence. Urge incontinence means that the need to urinate comes on so fast that you can't get to a toilet in time. Stress incontinence means that you leak urine because of pressure on your bladder. For example, it may happen when you laugh, cough, or lift something heavy.  Bladder training can increase how long you can wait before you have to urinate. It can also help your bladder hold more urine. And it can give you better control over the urge to urinate.  It is important to remember that bladder training takes a few weeks to a few months to make a difference. You may not see results right away, but don't give up.  Follow-up care is a key part of your treatment and safety. Be sure to make and go to all appointments, and call your doctor if you are having problems. It's also a good idea to know your test results and keep a list of the medicines you take.  How can you care for yourself at home?  Work with your doctor to come up with a bladder training program that is right for you. You may use one or more of the following methods.  Delayed urination  ?? In the beginning, try to keep from urinating for 5 minutes after you first feel the need to go.  ?? While you wait, take deep, slow breaths to relax. Kegel exercises can also help you delay the need to go to the bathroom.  ?? After some practice, when you can easily wait 5 minutes to urinate, try to wait 10 minutes before you urinate.  ?? Slowly increase the waiting period until you are able to control when you have to urinate.  Scheduled urination  ?? Empty your bladder when you first wake up in the morning.  ?? Schedule times throughout the day when you will urinate.  ?? Start by going to the bathroom every hour, even if you don't need to go.  ?? Slowly increase the time between trips to the bathroom.  ?? When you have found a schedule that works  well for you, keep doing it.  ?? If you wake up during the night and have to urinate, do it. Apply your schedule to waking hours only.  Kegel exercises  These tighten and strengthen pelvic muscles, which can help you control the flow of urine. To do Kegel exercises:  ?? Squeeze the same muscles you would use to stop your urine. Your belly and thighs should not move.  ?? Hold the squeeze for 3 seconds, and then relax for 3 seconds.  ?? Start with 3 seconds. Then add 1 second each week until you are able to squeeze for 10 seconds.  ?? Repeat the exercise 10 to 15 times a session. Do three or more sessions a day.  When should you call for help?  Watch closely for changes in your health, and be sure to contact your doctor if:  ?? Your incontinence is getting worse.  ?? You do not get better as expected.   Where can you learn more?   Go to http://www.[email or web address].net/[email or web address]  Enter V684 in the search box to learn more about "Bladder Training: After Your Visit."   ?? 2006-2014 [employer], [employer]. Care instructions adapted under license by [employer] (which disclaims liability or warranty for this information). This care instruction is for use with your licensed healthcare   professional. If you have questions about a medical condition or this instruction, always ask your healthcare professional. Healthwise, Incorporated disclaims any warranty or liability for your use of this information.  Content Version: 10.0.270728; Last Revised: May 10, 2012              Urge Incontinence in Women: After Your Visit  Your Care Instructions  Urge incontinence occurs when the need to urinate is so strong that you cannot reach the toilet in time, even when your bladder contains only a small amount of urine. This is also called overactive bladder or unstable bladder. Some women may have no warning before they leak urine. This condition does not cause major health problems, but it can be embarrassing and can affect a woman's self-esteem and  confidence.  Treatment can cure or improve your symptoms.  Follow-up care is a key part of your treatment and safety. Be sure to make and go to all appointments, and call your doctor if you are having problems. It's also a good idea to know your test results and keep a list of the medicines you take.  How can you care for yourself at home?  ?? Take your medicines exactly as prescribed. Call your doctor if you think you are having a problem with your medicine. You will get more details on the specific medicines your doctor prescribes.  ?? Limit caffeine and alcohol???they stimulate urine production.  ?? Urinate every 2 to 4 hours during waking hours, even if you feel that you do not have to go.  ?? Do pelvic floor (Kegel) exercises, which tighten and strengthen pelvic muscles. To do Kegel exercises:  ?? Squeeze the same muscles you would use to stop your urine. Your belly and thighs should not move.  ?? Hold the squeeze for 3 seconds, then relax for 3 seconds.  ?? Start with 3 seconds. Then add 1 second each week until you are able to squeeze for 10 seconds.  ?? Repeat the exercise 10 to 15 times for each session. Do three or more sessions each day.  ?? Try wearing pads that absorb the leaks.  ?? Keep skin in the genital area dry.  When should you call for help?  Call your doctor now or seek immediate medical care if:  ?? You have symptoms of a urinary infection. For example:  ?? You have blood or pus in your urine.  ?? You have pain in your back just below your rib cage. This is called flank pain.  ?? You have a fever, chills, or body aches.  ?? It hurts to urinate.  ?? You have groin or belly pain.  Watch closely for changes in your health, and be sure to contact your doctor if:  ?? You have a hard time urinating when your bladder feels full.  ?? You feel like you need to urinate often.  ?? Your bladder feels full even after you urinate.  ?? Your symptoms are getting worse.   Where can you learn more?   Go to  [email or web address]  Enter [phone] in the search box to learn more about "Urge Incontinence in Women: After Your Visit."   ?? 2006-2014 Healthwise, Inc[employer]. Care instructions adapted under license by [employer] (which disclaims liability or warranty for this information). This care instruction is for use with your licensed healthcare professional. If you have questions about a medical condition or this instruction, always ask your healthcare professional. Healthwise, Incorporated disclaims any warranty or liability for your  use of this information.  Content Version: 10.0.270728; Last Revised: May 21, 2012              Hyperlipidemia: After Your Visit  Your Care Instructions  Hyperlipidemia is too much fat in your blood. The body has several kinds of fat, including cholesterol and triglycerides. Your body needs fat for many things, such as making new cells. But too much fat in your blood increases your chances of having a heart attack or stroke.  You may be able to lower your cholesterol and triglycerides with a heart-healthy diet, exercise, and if needed, medicine. Your doctor may want you to try lifestyle changes first to see whether they lower the fat in your blood. You may need to take medicine if lifestyle changes do not lower the fat in your blood enough.  Follow-up care is a key part of your treatment and safety. Be sure to make and go to all appointments, and call your doctor if you are having problems. It???s also a good idea to know your test results and keep a list of the medicines you take.  How can you care for yourself at home?  Take your medicines  ?? Take your medicines exactly as prescribed. Call your doctor if you think you are having a problem with your medicine.  ?? If you take medicine to lower your cholesterol, go to follow-up visits. You will need to have blood tests.  ?? Do not take large doses of niacin, which is a B vitamin, while taking medicine called statins. It may  increase the chance of muscle pain and liver problems.  ?? Talk to your doctor about avoiding grapefruit juice if you are taking statins. Grapefruit juice can raise the level of this medicine in your blood. This could increase side effects.  Eat more fruits, vegetables, and fiber  ?? Fruits and vegetables have lots of nutrients that help protect against heart disease, and they have little???if any???fat. Try to eat at least five servings a day. Dark green, deep orange, or yellow fruits and vegetables are healthy choices.  ?? Keep carrots, celery, and other veggies handy for snacks. Buy fruit that is in season and store it where you can see it so that you will be tempted to eat it. Cook dishes that have a lot of veggies in them, such as stir-fries and soups.  ?? Foods high in fiber may reduce your cholesterol and provide important vitamins and minerals. High-fiber foods include whole-grain cereals and breads, oatmeal, beans, brown rice, citrus fruits, and apples.  ?? Buy whole-grain breads and cereals instead of white bread and pastries.  Limit saturated fat  ?? Read food labels and try to avoid saturated fat and trans fat. They increase your risk of heart disease.  ?? Use olive or canola oil when you cook. Try cholesterol-lowering spreads, such as Benecol or Take Control.  ?? Bake, broil, grill, or steam foods instead of frying them.  ?? Limit the amount of high-fat meats you eat, including hot dogs and sausages. Cut out all visible fat when you prepare meat.  ?? Eat fish, skinless poultry, and soy products such as tofu instead of high-fat meats. Soybeans may be especially good for your heart. Eat at least two servings of fish a week. Certain fish, such as salmon, contain omega-3 fatty acids, which may help reduce your risk of heart attack.  ?? Choose low-fat or fat-free milk and dairy products.  Get exercise, limit alcohol, and quit smoking  ??  Get more exercise. Work with your doctor to set up an exercise program. Even if you can  do only a small amount, exercise will help you get stronger, have more energy, and manage your weight and your stress. Walking is an easy way to get exercise. Gradually increase the amount you walk every day. Aim for at least 30 minutes on most days of the week. You also may want to swim, bike, or do other activities.  ?? Limit alcohol to no more than 2 drinks a day for men and 1 drink a day for women.  ?? Do not smoke. If you need help quitting, talk to your doctor about stop-smoking programs and medicines. These can increase your chances of quitting for good.  When should you call for help?  Call 911 anytime you think you may need emergency care. For example, call if:  ?? You have symptoms of a heart attack. These may include:  ?? Chest pain or pressure, or a strange feeling in the chest.  ?? Sweating.  ?? Shortness of breath.  ?? Nausea or vomiting.  ?? Pain, pressure, or a strange feeling in the back, neck, jaw, or upper belly or in one or both shoulders or arms.  ?? Lightheadedness or sudden weakness.  ?? A fast or irregular heartbeat.  After you call 911, the operator may tell you to chew 1 adult-strength or 2 to 4 low-dose aspirin. Wait for an ambulance. Do not try to drive yourself.  ?? You have signs of a stroke. These may include:  ?? Sudden numbness, paralysis, or weakness in your face, arm, or leg, especially on only one side of your body.  ?? New problems with walking or balance.  ?? Sudden vision changes.  ?? Drooling or slurred speech.  ?? New problems speaking or understanding simple statements, or feeling confused.  ?? A sudden, severe headache that is different from past headaches.  ?? You passed out (lost consciousness).  Call your doctor now or seek immediate medical care if:  ?? You have muscle pain or weakness.  Watch closely for changes in your health, and be sure to contact your doctor if:  ?? You are very tired.  ?? You have an upset stomach, gas, constipation, or belly pain or cramps.   Where can you learn  more?   Go to [email or web address]  Enter C406 in the search box to learn more about "Hyperlipidemia: After Your Visit."   ?? 2006-2013 Healthwise, Inc[employer]. Care instructions adapted under license by [employer] (which disclaims liability or warranty for this information). This care instruction is for use with your licensed healthcare professional. If you have questions about a medical condition or this instruction, always ask your healthcare professional. Health[employer], Incorporated disclaims any warranty or liability for your use of this information.  Content Version: 9.9.209917; Last Revised: July 28, 2010              Learning About High Blood Sugar  What is high blood sugar?  Your body turns the food you eat into glucose (sugar), which it uses for energy. But if your body isn't able to use the sugar right away, it can build up in your blood and lead to high blood sugar.  When the amount of sugar in your blood stays too high for too much of the time, you may have diabetes. Diabetes is a disease that can cause serious health problems.  The good news is that lifestyle changes may help you get your  blood sugar back to normal and avoid or delay diabetes.  What causes high blood sugar?  Sugar (glucose) can build up in your blood if you:  ?? Are overweight.  ?? Have a family history of diabetes.  ?? Take certain medicines, such as steroids.  What are the symptoms?  Having high blood sugar may not cause any symptoms at all. Or it may make you feel very thirsty or very hungry. You may also urinate more often than usual, have blurry vision, or lose weight without trying.  How is high blood sugar treated?  You can take steps to lower your blood sugar level if you understand what makes it get higher. Your doctor may want you to learn how to test your blood sugar level at home. Then you can see how illness, stress, or different kinds of food or medicine raise or lower your blood sugar level.  Other tests  may be needed to see if you have diabetes.  How can you prevent high blood sugar?  ?? Watch your weight. If you're overweight, losing just a small amount of weight may help. Reducing fat around your waist is most important.  ?? Eat a balanced diet. This may help you prevent or delay diabetes. Try to eat an even amount of carbohydrate throughout the day. This can help you avoid sudden peaks in blood sugar.  ?? Ask your doctor if you should see a dietitian. A registered dietitian can help you develop a meal plan that fits your lifestyle.  ?? Get at least 30 minutes of exercise on most days of the week. Exercise helps control your blood sugar. It also helps you maintain a healthy weight. Walking is a good choice. You also may want to do other activities, such as running, swimming, cycling, or playing tennis or team sports.  ?? If your doctor prescribed medicines, take them exactly as prescribed. Call your doctor if you think you are having a problem with your medicine. You will get more details on the specific medicines your doctor prescribes.  Follow-up care is a key part of your treatment and safety. Be sure to make and go to all appointments, and call your doctor if you are having problems. It's also a good idea to know your test results and keep a list of the medicines you take.   Where can you learn more?   Go to [email or web address]  Enter O108 in the search box to learn more about "Learning About High Blood Sugar."   ?? 2006-2014 [employer]wise, [employer]. Care instructions adapted under license by [employer] (which disclaims liability or warranty for this information). This care instruction is for use with your licensed healthcare professional. If you have questions about a medical condition or this instruction, always ask your healthcare professional. [employer], Inc[employer] disclaims any warranty or liability for your use of this information.  Content Version: 10.0.270728; Last Revised: January 23, 2011              Insulin Resistance: After Your Visit  Your Care Instructions  Insulin resistance means that the body cannot use insulin properly. Insulin lets sugar (glucose) enter the body's cells, where it is used for energy. It also helps muscles, fat, and liver cells store sugar to be released when needed. If the body tissues do not respond properly to insulin, the blood sugar level rises.  Insulin resistance mainly is caused by obesity, although other medical conditions, such as acromegaly and Cushing???s syndrome, also can cause it. It  can run in families too.  Follow-up care is a key part of your treatment and safety. Be sure to make and go to all appointments, and call your doctor if you are having problems. It???s also a good idea to know your test results and keep a list of the medicines you take.  How can you care for yourself at home?  ?? Take your medicines exactly as prescribed. Call your doctor if you think you are having a problem with your medicine. You will get more details on the specific medicines your doctor prescribes.  ?? Eat a good diet that spreads carbohydrate throughout the day.  ?? Get at least 30 minutes of exercise on most days of the week. Exercise helps control your blood sugar. It also helps you maintain a healthy weight. Walking is a good choice. You also may want to do other activities, such as running, swimming, cycling, or playing tennis or team sports.  ?? Try to lose weight. Losing even a small amount of weight can help.  ?? Do not smoke. If you need help quitting, talk to your doctor about stop-smoking programs and medicines. These can increase your chances of quitting for good.  When should you call for help?  Call 911 if you have symptoms of a heart attack. These may include:  ?? Chest pain or pressure, or a strange feeling in the chest.  ?? Sweating.  ?? Shortness of breath.  ?? Nausea or vomiting.  ?? Pain, pressure, or a strange feeling in the back, neck, jaw, or upper belly or in  one or both shoulders or arms.  ?? Lightheadedness or sudden weakness.  ?? A fast or irregular heartbeat.  After calling 911, the operator may tell you to chew 1 adult-strength or 2 to 4 low-dose aspirin. Wait for an ambulance. Do not try to drive yourself.  Call 911 if you have signs of a stroke, such as:  ?? Sudden numbness, paralysis, or weakness in your face, arm, or leg, especially on only one side of your body.  ?? New problems with walking or balance.  ?? Sudden vision changes.  ?? Drooling or slurred speech.  ?? New problems speaking or understanding simple statements, or feeling confused.  ?? A sudden, severe headache that is different from past headaches.  Call your doctor now or seek immediate medical care if:  ?? You have any symptoms of diabetes. These may include:  ?? Being thirsty more often.  ?? Urinating more.  ?? Being hungrier.  ?? Losing weight.  ?? Being very tired.  ?? Having blurry vision.  ?? You have a wound that will not heal.  ?? You have an infection that will not go away.  ?? You have problems with your blood pressure.  Watch closely for changes in your health, and be sure to contact your doctor if you have any problems.   Where can you learn more?   Go to [email or web address]  Enter V537 in the search box to learn more about "Insulin Resistance: After Your Visit."   ?? 2006-2014 [employer]. Care instructions adapted under license by [employer] (which disclaims liability or warranty for this information). This care instruction is for use with your licensed healthcare professional. If you have questions about a medical condition or this instruction, always ask your healthcare professional. [employer], [employer] disclaims any warranty or liability for your use of this information.  Content Version: 10.0.270728; Last Revised: December 05, 2011  Insomnia: After Your Visit  Your Care Instructions  Insomnia is the inability to sleep well. It is a common problem for  most people at some time. Insomnia may make it hard for you to get to sleep, stay asleep, or sleep as long as you need to. This can make you tired and grouchy during the day. It can also make you forgetful, less effective at work, and unhappy.  Insomnia can be caused by conditions such as depression or anxiety. Pain can also affect your ability to sleep. When these problems are solved, the insomnia usually clears up. But sometimes bad sleep habits can cause insomnia.  If insomnia is affecting your work or your enjoyment of life, you can take steps to improve your sleep.  Follow-up care is a key part of your treatment and safety. Be sure to make and go to all appointments, and call your doctor if you are having problems. It???s also a good idea to know your test results and keep a list of the medicines you take.  How can you care for yourself at home?  What to avoid  ?? Do not have drinks with caffeine, such as coffee or black tea, for 8 hours before bed.  ?? Do not smoke or use other types of tobacco near bedtime. Nicotine is a stimulant and can keep you awake.  ?? Avoid drinking alcohol late in the evening, because it can cause you to wake in the middle of the night.  ?? Do not eat a big meal close to bedtime. If you are hungry, eat a light snack.  ?? Do not drink a lot of water close to bedtime, because the need to urinate may wake you up during the night.  ?? Do not read or watch TV in bed. Use the bed only for sleeping and sexual activity.  What to try  ?? Go to bed at the same time every night, and wake up at the same time every morning. Do not take naps during the day.  ?? Keep your bedroom quiet, dark, and cool.  ?? Get regular exercise, but not within 3 to 4 hours of your bedtime.  ?? Sleep on a comfortable pillow and mattress.  ?? If watching the clock makes you anxious, turn it facing away from you so you cannot see the time.  ?? If you worry when you lie down, start a worry book. Well before bedtime, write down your  worries, and then set the book and your concerns aside.  ?? Try meditation or other relaxation techniques before you go to bed.  ?? If you cannot fall asleep, get up and go to another room until you feel sleepy. Do something relaxing. Repeat your bedtime routine before you go to bed again.  ?? Make your house quiet and calm about an hour before bedtime. Turn down the lights, turn off the TV, log off the computer, and turn down the volume on music. This can help you relax after a busy day.  When should you call for help?  Watch closely for changes in your health, and be sure to contact your doctor if:  ?? Your efforts to improve your sleep do not work.  ?? Your insomnia gets worse.  ?? You have been feeling down, depressed, or hopeless or have lost interest in things that you usually enjoy.   Where can you learn more?   Go to [email or web address]  Enter P513 in the search box to learn more about "Insomnia:  After Your Visit."   ?? 2006-2014 [employer]. Care instructions adapted under license by [employer] (which disclaims liability or warranty for this information). This care instruction is for use with your licensed healthcare professional. If you have questions about a medical condition or this instruction, always ask your healthcare professional. [employer] disclaims any warranty or liability for your use of this information.  Content Version: 10.0.270728; Last Revised: November 29, 2011    SLEEP HYGIENE    1.  Try to maintain a regular schedule for bedtime, rise time, meals, exercise, chores, etc.  The body's internal clock or Circadian Rhythm works best when set schedules are kept.  If you stay up later on weekends, try not to sleep more than an hour past your usual wake time.  ALLOW AT LEAST 7 HOURS UNINTERRUPTED SLEEP.    2.  Try an OTC sleep agent (Unisom, Benadryl, Melatonin 0.5-1 mg, etc.) nightly for 2 weeks, if okay with your doctor.  Take 1 pill 30 minutes before your  chosen bedtime.  This will help to reset your sleep/wake cycle.  Absolutely no driving once this medicine is taken.      3.  AVOID stimulants such as caffeine (coffee, tea, cocoa, chocolate, colas) and medications like No Doz or ADHD medication after lunchtime or 4-8 hours before bedtime.  Heavy caffeine use early in the day can also disrupt sleep.    4.  AVOID alcoholic beverages 6 hours prior to bedtime.  It can cause frequent awakenings in the night as your body gets rid of the alcohol.    5.  AVOID heavy meals before bedtime.  Try to eat dinner 2-3 hours before bedtime.  A light snack which includes dairy products is a good idea since these chemicals (tryptophan) promote drowsiness.  Do not do this if you are intolerant to dairy products.    6.  AVOID exercising before bedtime.  Exercise should be performed preferably in light during the day.    7.  AVOID napping during the day, especially after 3 pm.  If a nap is needed, limit daytime naps to 1 hour.    8.  AVOID nicotine prior to bed.  Withdrawal from nicotine can cause temporary sleep disruptions.  Once withdrawal is complete, the ex-smoker will probably find that sleep comes faster.    9.  AVOID laying in bed awake for long periods of time.  Ger up, do a simple activity, then return to the bedroom when you are drowsy.    10.  AVOID clock watching.  Remove easily visible clocks from the room or place them where they are not easily visible.  Clock watching adds stress to this situation.    11.  AVOID bright lights and loud music in the bedroom.  Make the room dark and quiet.  Sleep is the only thing you should do in your bed.  Light signals your brain to be alert.    12.  AVOID warm rooms when trying to sleep.  Keep the bedroom well-ventilated and on the cool side.  Feeling too warm can cause night time waking.    13. AVOID drinking large amounts of water at bedtime.  Decrease beverage consumption  2-3 hours before bedtime.   Use the bathroom to empty your  bladder before getting into bed.    14.  Take an over the counter sleep agent such as Tylenol PM or Advil PM if pain is keeping you awake at night, if okay with your  doctor.            Insomnia: After Your Visit  Your Care Instructions  Insomnia is the inability to sleep well. It is a common problem for most people at some time. Insomnia may make it hard for you to get to sleep, stay asleep, or sleep as long as you need to. This can make you tired and grouchy during the day. It can also make you forgetful, less effective at work, and unhappy.  Insomnia can be caused by conditions such as depression or anxiety. Pain can also affect your ability to sleep. When these problems are solved, the insomnia usually clears up. But sometimes bad sleep habits can cause insomnia.  If insomnia is affecting your work or your enjoyment of life, you can take steps to improve your sleep.  Follow-up care is a key part of your treatment and safety. Be sure to make and go to all appointments, and call your doctor if you are having problems. It???s also a good idea to know your test results and keep a list of the medicines you take.  How can you care for yourself at home?  What to avoid  ?? Do not have drinks with caffeine, such as coffee or black tea, for 8 hours before bed.  ?? Do not smoke or use other types of tobacco near bedtime. Nicotine is a stimulant and can keep you awake.  ?? Avoid drinking alcohol late in the evening, because it can cause you to wake in the middle of the night.  ?? Do not eat a big meal close to bedtime. If you are hungry, eat a light snack.  ?? Do not drink a lot of water close to bedtime, because the need to urinate may wake you up during the night.  ?? Do not read or watch TV in bed. Use the bed only for sleeping and sexual activity.  What to try  ?? Go to bed at the same time every night, and wake up at the same time every morning. Do not take naps during the day.  ?? Keep your bedroom quiet, dark, and cool.  ?? Get  regular exercise, but not within 3 to 4 hours of your bedtime.  ?? Sleep on a comfortable pillow and mattress.  ?? If watching the clock makes you anxious, turn it facing away from you so you cannot see the time.  ?? If you worry when you lie down, start a worry book. Well before bedtime, write down your worries, and then set the book and your concerns aside.  ?? Try meditation or other relaxation techniques before you go to bed.  ?? If you cannot fall asleep, get up and go to another room until you feel sleepy. Do something relaxing. Repeat your bedtime routine before you go to bed again.  ?? Make your house quiet and calm about an hour before bedtime. Turn down the lights, turn off the TV, log off the computer, and turn down the volume on music. This can help you relax after a busy day.  When should you call for help?  Watch closely for changes in your health, and be sure to contact your doctor if:  ?? Your efforts to improve your sleep do not work.  ?? Your insomnia gets worse.  ?? You have been feeling down, depressed, or hopeless or have lost interest in things that you usually enjoy.   Where can you learn more?   Go to [email or web address]  Enter P513 in the search box to learn more about "Insomnia: After Your Visit."   ?? 2006-2014 [employer]. Care instructions adapted under license by [employer] (which disclaims liability or warranty for this information). This care instruction is for use with your licensed healthcare professional. If you have questions about a medical condition or this instruction, always ask your healthcare professional. [employer] disclaims any warranty or liability for your use of this information.  Content Version: 10.0.270728; Last Revised: November 29, 2011

## 2013-03-14 NOTE — Progress Notes (Signed)
HISTORY OF PRESENT ILLNESS  Kathryn [name] is a 77 y.o. female presents with Urgency, Tremors, Hypertension, Results and Medication Refill    Agree with nurse note.    Patient with history of hypertension, hypercholesterolemia, insulin resistance, prediabetes, and herpes simplex requests most recent lab results.      Patient has been trying to increase water intake and has been drinking protein shakes with almond milk and blueberries, but is also drinking orange juice, juice drinks with aspartame, and coffee with artificial sweeteners daily.  Patient has been eating white bagels.  She has lost 6.4 pounds since last visit on 12/09/12.  Patient says she sometimes fasts at night unintentionally when she eats breakfast and lunch late.     Patient has difficulty falling asleep at night and is waking up throughout the night. Notes she took Melatonin 2 mg at 400 AM today. She's had a leaky bladder for 6 months and has occasional dysuria and nocturia, but denies itching or abdominal pain. Patient has been having intermittent tremors in bilateral hands.       Last saw Dr Lewayne Bunting November 2012 for multinodular goiter that she had biopsied in 2011 which was benign . Instructed to follow up in 2 years with Dr Adriana Simas,  but has scheduled thyroid scan and uptake test next Tuesday.  Denies difficulty swallowing or breathing.      Patient recently had bone scan for her osteopenia.  She is tolerating Evista well.  Needs refill.    She recently sold her house and will be moving to [place] where she has support from family and friends.    Stressors:  3 years since the death of her spouse    Scribed by Norvel Richards, as dictated by Amie Portland, DO    ROS    Review of Systems negative except as noted above in HPI.    ALLERGIES:    Allergies   Allergen Reactions   ??? Other Medication Rash     All cillins   ??? Penicillins Rash and Swelling   ??? Tetracycline Itching   ??? Bactrim (Sulfamethoprim Ds) Itching   ??? Celebrex  (Celecoxib) Other (comments)     Memory disturbance, slowed thinking   ??? Crestor (Rosuvastatin) Other (comments)     Elevated CPK   ??? Lipitor (Atorvastatin) Other (comments)     Leg cramps   ??? Niaspan (Niacin) Other (comments)     Caused elevated BP   ??? Zocor (Simvastatin) Other (comments)     Leg cramps       CURRENT MEDICATIONS:  Current outpatient prescriptions:cholecalciferol, vitamin D3, (VITAMIN D3) 2,000 unit tab, Take  by mouth., Disp: , Rfl: ;  raloxifene (EVISTA) 60 mg tablet, TAKE 1 TABLET EVERY DAY FOR BONES, Disp: 90 Tab, Rfl: 3;  valACYclovir (VALTREX) 500 mg tablet, Take 1 Tab by mouth daily. For HSV, Disp: 90 Tab, Rfl: 3;  valsartan-hydrochlorothiazide (DIOVAN-HCT) 160-12.5 mg per tablet, Take 1 Tab by mouth daily., Disp: 90 Tab, Rfl: 3  pravastatin (PRAVACHOL) 80 mg tablet, TAKE 1 TABLET BY MOUTH EVERY DAY, Disp: 90 Tab, Rfl: 3;  mometasone (NASONEX) 50 mcg/actuation nasal spray, 2 Sprays by Both Nostrils route daily., Disp: 1 Container, Rfl: 0;  aspirin (ASPIRIN) 325 mg tablet, Take 325 mg by mouth daily.  , Disp: , Rfl: ;  calcium 500 mg Tab, Take  by mouth.  , Disp: , Rfl: ;  magnesium 250 mg Tab, Take  by mouth. Unsure of exact strength , Disp: ,  Rfl:   b complex vitamins tablet, Take 1 Tab by mouth daily.  , Disp: , Rfl: ;  ascorbic acid (VITAMIN C) 500 mg tablet, Take 500 mg by mouth nightly., Disp: , Rfl: ;  multivitamin (ONE A DAY) tablet, Take 1 Tab by mouth daily., Disp: , Rfl: ;  [DISCONTINUED] valACYclovir (VALTREX) 500 mg tablet, Take 1 Tab by mouth daily. For HSV, Disp: 90 Tab, Rfl: 3;  calcium-cholecalciferol, d3, (CALCIUM 600 + D) 600-125 mg-unit Tab, Take  by mouth.  , Disp: , Rfl:     PAST MEDICAL HISTORY:    Past Medical History   Diagnosis Date   ??? Edema      left ankle   ??? DDD (degenerative disc disease) 10/1999     L 4-5 by MRI.  Dr. Shiela Mayer.  Dr. Robie Ridge, Pain   ??? Urinary incontinence, stress      mild.  Dr. Winnifred Friar   ??? Polyp of rectum      Dr. Reed Pandy   ???  Cystocele      Dr. Winnifred Friar   ??? Postmenopausal HRT (hormone replacement therapy)      Hx  for 6 years.  Dr. Lavone Nian   ??? Morton's neuroma 2000     Left.  Dr. Gillis Santa   ??? Hematuria of undiagnosed cause 1980, 2009   ??? Single renal cyst 02/12/2008     Left.  0.9 cm   ??? Subchondral cysts 2009     Left hip femoral heads.  Dr. Adah Perl   ??? DJD (degenerative joint disease) 05/26/04     Dr. Shiela Mayer.   ??? Hypercholesterolemia    ??? Hypertension    ??? Kidney stone 12/2002     Dr. Gillis Ends   ??? Chickenpox childhood   ??? Rotator cuff tendinitis 12/20/04     Right.  Dr. Jen Mow   ??? Benign positional vertigo 01/07/04     Dr. Minette Headland   ??? SOB (shortness of breath) 07/11/04     Dr. Abigail Miyamoto.  Neg Stress Cardiolite.  EF 60 %.   ??? Unspecified vitamin D deficiency 2008   ??? Herpes zoster 02/26/07, 06/21/07, 08/27/07     Left buttocks then Right T2-3 dermatome   ??? Herpes simplex type 2 infection 10/05/08     Left buttocks   ??? Cataract 2002     Dr. Theodoro Grist   ??? Multinodular goiter 10/2009     Dr. Lewayne Bunting.  benign.   ??? Internal carotid artery stenosis 09/18/11     Left.  16-49%.     ??? Basal cell cancer 02/2012     left lower leg.  Left foot.  Dr. Tilden Fossa.   ??? Near syncope 02/26/12     Negative Stress Test.  due to low blood sugar.  Dr. Meredith Mody   ??? Osteopenia 06/27/12     [facility]   ??? Hip pain [id number]     Right.  due to DJD.  Dr. Birdena Crandall.   ??? Facial pain 2013     due to sinusitis.  Dr. Claria Dice.       PAST SURGICAL HISTORY:    Past Surgical History   Procedure Laterality Date   ??? Hx cyst removal  1970s     Rt breast then Left.  benign.   ??? Hx bunionectomy  1970s, 2003     Bilateral.  Dr. Stephannie Li.   ??? Hx hysterectomy  1988  with BSO   ??? Pr foot/toes surgery proc unlisted  2000     Left Morton's Neuroma.  Dr. Stephannie Li   ??? Hx hip replacement  12/21/08 Right, 06/14/09 Left     bilaterally.  Dr. Birdena Crandall      ??? Hx breast biopsy  1970's      bil.   ??? Hx cataract removal  2002, 2005     bilateral.  Dr. Theodoro Grist.   ??? Hx tonsillectomy     ??? Hx appendectomy     ??? Ir fine needle aspiration thyroid  10/2009     Right upper pole.  due to multinodular goiter.  Dr. Lewayne Bunting.  benign.   ??? Hx malignant skin lesion excision  04/23/2012     left lower leg       FAMILY HISTORY:    Family History   Problem Relation Age of Onset   ??? Diabetes Mother    ??? Heart Attack Mother 18   ??? Cancer Father      pancreatic   ??? Cancer Brother      bile duct cancer   ??? Heart Attack Sister 47   ??? Arthritis-osteo Sister    ??? Asthma Sister        SOCIAL HISTORY:    History     Social History   ??? Marital Status: MARRIED     Spouse Name: N/A     Number of Children: N/A   ??? Years of Education: N/A     Social History Main Topics   ??? Smoking status: Former Smoker -- 0.50 packs/day for 10 years     Types: Cigarettes     Quit date: 10/17/1987   ??? Smokeless tobacco: Never Used   ??? Alcohol Use: No   ??? Drug Use: No   ??? Sexually Active: Yes -- Female partner(s)     Birth Control/ Protection: None     Other Topics Concern   ??? Not on file     Social History Narrative   ??? No narrative on file       IMMUNIZATIONS:    Immunization History   Administered Date(s) Administered   ??? H1N1 Influenza Virus Vaccine 09/15/2008   ??? Influenza Vaccine Split 08/26/2009   ??? Influenza Vaccine Whole 06/16/2008   ??? Pneumococcal Vaccine 10/16/1998, 02/21/2011   ??? TD Vaccine 01/06/2003   ??? Zoster 05/25/2011         PHYSICAL EXAMINATION    Vital Signs  BP 115/70   Pulse 70   Temp(Src) 96.7 ??F (35.9 ??C)   Ht 5' 1.8" (1.57 m)   Wt 148 lb 9.6 oz (67.405 kg)   BMI 27.35 kg/m2   SpO2 98%    General appearance - Well nourished. Well appearing.  Well developed.  No acute distress. Overweight.   Head - Normocephalic.  Atraumatic.    Eyes - pupils equal and reactive. Extraocular eye movements intact. Sclera anicteric.  Mildly injected sclera.  Ears - Hearing is grossly normal bilaterally.      Nose - normal and patent.  No polyps  noted.  No erythema.  No discharge.    Mouth - mucous membranes with adequate moisture.  Posterior pharynx normal with cobblestone appearance.  No erythema, white exudate or obstruction.  Neck - supple.  Midline trachea.  No carotid bruits noted bilaterally.  Mild thyromegaly noted.  Chest - clear to auscultation bilaterally anterriorly and posteriorly.  No wheezes.  No rales or rhonchi.  Breath sounds  are symmetrical bilaterally.  Unlabored respirations.  Heart - normal rate.  Regular rhythm.  Normal S1, S2.  No murmur noted.  No rubs, clicks or gallops noted.  Abdomen - soft and mildly distended.  No masses or organomegaly.  No rebound, rigidity or guarding.  Bowel sounds normal x 4 quadrants.  No tenderness noted.  Neurological - awake, alert and oriented to person, place, and time and event.  Cranial nerves II through XII intact.  Clear speech.  Muscle strength is +5/5 x 4 extremities.  Sensation is intact to light touch bilaterally.  Steady gait.   Heme/Lymph - peripheral pulses normal x 4 extremities.  No peripheral edema is noted.    Musculoskeletal - Intact x 4 extremities.  Full ROM x 4 extremities.  No pain with movement.    Back exam - normal range of motion.  No pain on palpation of the spinous processes in the cervical, thoracic, lumbar, sacral regions.  No CVA tenderness.    Skin - no rashes, erythema, ecchymosis, lacerations, abrasions, suspicious moles noted  Psychological -   normal behavior, dress and thought processes.  Good insight. Good eye contact.  Normal affect.  Appropriate mood.  Normal speech.  Scribed by Norvel Richards, as dictated by Amie Portland, DO    DATA REVIEWED    Results for orders placed in visit on 03/07/13   METABOLIC PANEL, COMPREHENSIVE       Result Value Range    Glucose 83  65 - 99 mg/dL    BUN 15  8 - 27 mg/dL    Creatinine 1.61  0.96 - 1.00 mg/dL    GFR est non-AA 84  >04 mL/min/1.73    GFR est AA 96  >59 mL/min/1.73    BUN/Creatinine ratio 21  11 - 26     Sodium 142  134 - 144 mmol/L    Potassium 3.5  3.5 - 5.2 mmol/L    Chloride 106  97 - 108 mmol/L    CO2 27  19 - 28 mmol/L    Calcium 9.4  8.6 - 10.2 mg/dL    Protein, total 6.2  6.0 - 8.5 g/dL    Albumin 4.1  3.5 - 4.8 g/dL    GLOBULIN, TOTAL 2.1  1.5 - 4.5 g/dL    A-G Ratio 2.0  1.1 - 2.5    Bilirubin, total 0.3  0.0 - 1.2 mg/dL    Alk. phosphatase 50  39 - 117 IU/L    AST 28  0 - 40 IU/L    ALT 19  0 - 32 IU/L   HEMOGLOBIN A1C       Result Value Range    Hemoglobin A1c 6.2 (*) 4.8 - 5.6 %   URINALYSIS W/ RFLX MICROSCOPIC       Result Value Range    Specific Gravity 1.023  1.005 - 1.030    pH (UA) 6.0  5.0 - 7.5    Color Yellow  Yellow    Appearance Cloudy (*) Clear    Leukocyte Esterase 1+ (*) Negative    Protein Trace  Negative/Trace    Glucose Negative  Negative    Ketone Negative  Negative    Blood Negative  Negative    Bilirubin Negative  Negative    Urobilinogen 0.2  0.0 - 1.9 mg/dL    Nitrites Negative  Negative    Microscopic Examination See additional order     NMR LIPOPROFILE       Result Value Range  LDL-P 1270 (*) <1000 nmol/L    LDL-C 76  <100 mg/dL    HDL-C 52  >=09 mg/dL    Triglycerides 811  <914 mg/dL    Cholesterol, Total 782  <200 mg/dL    HDL-P (Total) 95.6  >=30.5 umol/L    Small LDL-P 664 (*) <=527 nmol/L    LDL size 20.6  >20.5 nm    LP-IR SCORE 53 (*) <=45   MICROSCOPIC EXAMINATION       Result Value Range    WBC 6-10 (*) 0 -  5 /hpf    RBC 0-2  0 -  2 /hpf    Epithelial cells 0-10  0 - 10 /hpf    Casts Present (*) None seen /lpf    Cast type Comment  N/A    Mucus Present  Not Estab.    Bacteria Few  None seen/Few       ASSESSMENT and PLAN      ICD-9-CM    1. Essential hypertension, benign 401.1 pravastatin (PRAVACHOL) 80 mg tablet     MICROALBUMIN:CREATININE RATIO, RANDOM URINE   2. Hypercholesterolemia 272.0 valsartan-hydrochlorothiazide (DIOVAN-HCT) 160-12.5 mg per tablet    with elevated LDLP and sdLDLP   3. Urinary incontinence 788.30 URINALYSIS W/ RFLX MICROSCOPIC     CULTURE, URINE    4. Herpes simplex type 2 infection 054.9 valACYclovir (VALTREX) 500 mg tablet   5. Insulin resistance 277.7    6. Weight loss 783.21     7# due to night fasting vs blood sugar vs other   7. Tremor 781.0     bilateral hands due to thyroid vs ?low blood sugar vs sleep deprivation vs other   8. Prediabetes 790.29    9. Osteopenia 733.90 raloxifene (EVISTA) 60 mg tablet     current treatment plan is effective, no change in therapy  lab results and schedule of future lab studies reviewed with patient  reviewed diet, exercise and weight control  cardiovascular risk and specific lipid/LDL goals reviewed  reviewed medications and side effects in detail    Discussed the patient's BMI with her.  The BMI follow up plan is as follows: BMI is out of normal parameters and plan is as follows: I have counseled this patient on diet and exercise regimens.  Addressed weight, diet and exercise with patient.  Decrease carbohydrates (white foods, sweet foods, sweet drinks and alcohol), increase green leafy vegetables and protein (lean meats and beans).  Avoid fried foods. Replace artificial sweeteners with raw sugars or fruit/vegetable infused water. Increase water intake.  Eat 3-5 small meals daily. Can eat protein shake for dinner. Increase physical activity to 30 minutes daily for health benefit or 60 minutes daily to prevent weight regain.    Continue current medications and care.  Take Melatonin 30 minutes before bedtime.  Decrease to 1 mg q hs.  Pt declines any other sleep support at this time.  Prescriptions written and sent to pharmacy; medication side effects discussed  Most recent tests reviewed  Recheck pertinent labs prior to next ov.  Recent office visit notes from Dr. Reece Agar. Cook's visit reviewed  Counseled patient on health concerns:  Dietary modifications and correct Melatonin use.  Sleep hygiene.  Bladder hygiene.    Relevant handouts given and discussed with patient  Immunizations noted  Offered empathy, support,  legitamation, prayers, partnership to patient  Praised patient for progress  Follow-up Disposition:  Return in about 3 months (around 06/14/2013) for weight, sleep, sugar.    Patient  expresses understanding of treatment plan and agrees with recommendations.    More than 30 mins spent face to face with patient and more than 50% of this time spent in counseling and coordinating care.      Patient Instructions       Bladder Training: After Your Visit  Your Care Instructions  Bladder training is used to treat urge incontinence and stress incontinence. Urge incontinence means that the need to urinate comes on so fast that you can't get to a toilet in time. Stress incontinence means that you leak urine because of pressure on your bladder. For example, it may happen when you laugh, cough, or lift something heavy.  Bladder training can increase how long you can wait before you have to urinate. It can also help your bladder hold more urine. And it can give you better control over the urge to urinate.  It is important to remember that bladder training takes a few weeks to a few months to make a difference. You may not see results right away, but don't give up.  Follow-up care is a key part of your treatment and safety. Be sure to make and go to all appointments, and call your doctor if you are having problems. It's also a good idea to know your test results and keep a list of the medicines you take.  How can you care for yourself at home?  Work with your doctor to come up with a bladder training program that is right for you. You may use one or more of the following methods.  Delayed urination  ?? In the beginning, try to keep from urinating for 5 minutes after you first feel the need to go.  ?? While you wait, take deep, slow breaths to relax. Kegel exercises can also help you delay the need to go to the bathroom.  ?? After some practice, when you can easily wait 5 minutes to urinate, try to wait 10 minutes before you urinate.   ?? Slowly increase the waiting period until you are able to control when you have to urinate.  Scheduled urination  ?? Empty your bladder when you first wake up in the morning.  ?? Schedule times throughout the day when you will urinate.  ?? Start by going to the bathroom every hour, even if you don't need to go.  ?? Slowly increase the time between trips to the bathroom.  ?? When you have found a schedule that works well for you, keep doing it.  ?? If you wake up during the night and have to urinate, do it. Apply your schedule to waking hours only.  Kegel exercises  These tighten and strengthen pelvic muscles, which can help you control the flow of urine. To do Kegel exercises:  ?? Squeeze the same muscles you would use to stop your urine. Your belly and thighs should not move.  ?? Hold the squeeze for 3 seconds, and then relax for 3 seconds.  ?? Start with 3 seconds. Then add 1 second each week until you are able to squeeze for 10 seconds.  ?? Repeat the exercise 10 to 15 times a session. Do three or more sessions a day.  When should you call for help?  Watch closely for changes in your health, and be sure to contact your doctor if:  ?? Your incontinence is getting worse.  ?? You do not get better as expected.   Where can you learn more?   Go to [facility]  Enter [phone] in the search box to learn more about "Bladder Training: After Your Visit."   ?? 2006-2014 [employer]. Care instructions adapted under license by [employer] (which disclaims liability or warranty for this information). This care instruction is for use with your licensed healthcare professional. If you have questions about a medical condition or this instruction, always ask your healthcare professional. [employer] disclaims any warranty or liability for your use of this information.  Content Version: 10.0.270728; Last Revised: May 10, 2012              Urge Incontinence in Women: After Your Visit  Your Care  Instructions  Urge incontinence occurs when the need to urinate is so strong that you cannot reach the toilet in time, even when your bladder contains only a small amount of urine. This is also called overactive bladder or unstable bladder. Some women may have no warning before they leak urine. This condition does not cause major health problems, but it can be embarrassing and can affect a woman's self-esteem and confidence.  Treatment can cure or improve your symptoms.  Follow-up care is a key part of your treatment and safety. Be sure to make and go to all appointments, and call your doctor if you are having problems. It's also a good idea to know your test results and keep a list of the medicines you take.  How can you care for yourself at home?  ?? Take your medicines exactly as prescribed. Call your doctor if you think you are having a problem with your medicine. You will get more details on the specific medicines your doctor prescribes.  ?? Limit caffeine and alcohol???they stimulate urine production.  ?? Urinate every 2 to 4 hours during waking hours, even if you feel that you do not have to go.  ?? Do pelvic floor (Kegel) exercises, which tighten and strengthen pelvic muscles. To do Kegel exercises:  ?? Squeeze the same muscles you would use to stop your urine. Your belly and thighs should not move.  ?? Hold the squeeze for 3 seconds, then relax for 3 seconds.  ?? Start with 3 seconds. Then add 1 second each week until you are able to squeeze for 10 seconds.  ?? Repeat the exercise 10 to 15 times for each session. Do three or more sessions each day.  ?? Try wearing pads that absorb the leaks.  ?? Keep skin in the genital area dry.  When should you call for help?  Call your doctor now or seek immediate medical care if:  ?? You have symptoms of a urinary infection. For example:  ?? You have blood or pus in your urine.  ?? You have pain in your back just below your rib cage. This is called flank pain.  ?? You have a fever,  chills, or body aches.  ?? It hurts to urinate.  ?? You have groin or belly pain.  Watch closely for changes in your health, and be sure to contact your doctor if:  ?? You have a hard time urinating when your bladder feels full.  ?? You feel like you need to urinate often.  ?? Your bladder feels full even after you urinate.  ?? Your symptoms are getting worse.   Where can you learn more?   Go to [facility]  Enter [phone] in the search box to learn more about "Urge Incontinence in Women: After Your Visit."   ?? 2006-2014 [employer]. Care instructions adapted under license  by [employer] (which disclaims liability or warranty for this information). This care instruction is for use with your licensed healthcare professional. If you have questions about a medical condition or this instruction, always ask your healthcare professional. [employer] disclaims any warranty or liability for your use of this information.  Content Version: 10.0.270728; Last Revised: May 21, 2012              Hyperlipidemia: After Your Visit  Your Care Instructions  Hyperlipidemia is too much fat in your blood. The body has several kinds of fat, including cholesterol and triglycerides. Your body needs fat for many things, such as making new cells. But too much fat in your blood increases your chances of having a heart attack or stroke.  You may be able to lower your cholesterol and triglycerides with a heart-healthy diet, exercise, and if needed, medicine. Your doctor may want you to try lifestyle changes first to see whether they lower the fat in your blood. You may need to take medicine if lifestyle changes do not lower the fat in your blood enough.  Follow-up care is a key part of your treatment and safety. Be sure to make and go to all appointments, and call your doctor if you are having problems. It???s also a good idea to know your test results and keep a list of the medicines you take.  How can  you care for yourself at home?  Take your medicines  ?? Take your medicines exactly as prescribed. Call your doctor if you think you are having a problem with your medicine.  ?? If you take medicine to lower your cholesterol, go to follow-up visits. You will need to have blood tests.  ?? Do not take large doses of niacin, which is a B vitamin, while taking medicine called statins. It may increase the chance of muscle pain and liver problems.  ?? Talk to your doctor about avoiding grapefruit juice if you are taking statins. Grapefruit juice can raise the level of this medicine in your blood. This could increase side effects.  Eat more fruits, vegetables, and fiber  ?? Fruits and vegetables have lots of nutrients that help protect against heart disease, and they have little???if any???fat. Try to eat at least five servings a day. Dark green, deep orange, or yellow fruits and vegetables are healthy choices.  ?? Keep carrots, celery, and other veggies handy for snacks. Buy fruit that is in season and store it where you can see it so that you will be tempted to eat it. Cook dishes that have a lot of veggies in them, such as stir-fries and soups.  ?? Foods high in fiber may reduce your cholesterol and provide important vitamins and minerals. High-fiber foods include whole-grain cereals and breads, oatmeal, beans, brown rice, citrus fruits, and apples.  ?? Buy whole-grain breads and cereals instead of white bread and pastries.  Limit saturated fat  ?? Read food labels and try to avoid saturated fat and trans fat. They increase your risk of heart disease.  ?? Use olive or canola oil when you cook. Try cholesterol-lowering spreads, such as Benecol or Take Control.  ?? Bake, broil, grill, or steam foods instead of frying them.  ?? Limit the amount of high-fat meats you eat, including hot dogs and sausages. Cut out all visible fat when you prepare meat.  ?? Eat fish, skinless poultry, and soy products such as tofu instead of high-fat meats.  Soybeans may be especially good for  your heart. Eat at least two servings of fish a week. Certain fish, such as salmon, contain omega-3 fatty acids, which may help reduce your risk of heart attack.  ?? Choose low-fat or fat-free milk and dairy products.  Get exercise, limit alcohol, and quit smoking  ?? Get more exercise. Work with your doctor to set up an exercise program. Even if you can do only a small amount, exercise will help you get stronger, have more energy, and manage your weight and your stress. Walking is an easy way to get exercise. Gradually increase the amount you walk every day. Aim for at least 30 minutes on most days of the week. You also may want to swim, bike, or do other activities.  ?? Limit alcohol to no more than 2 drinks a day for men and 1 drink a day for women.  ?? Do not smoke. If you need help quitting, talk to your doctor about stop-smoking programs and medicines. These can increase your chances of quitting for good.  When should you call for help?  Call 911 anytime you think you may need emergency care. For example, call if:  ?? You have symptoms of a heart attack. These may include:  ?? Chest pain or pressure, or a strange feeling in the chest.  ?? Sweating.  ?? Shortness of breath.  ?? Nausea or vomiting.  ?? Pain, pressure, or a strange feeling in the back, neck, jaw, or upper belly or in one or both shoulders or arms.  ?? Lightheadedness or sudden weakness.  ?? A fast or irregular heartbeat.  After you call 911, the operator may tell you to chew 1 adult-strength or 2 to 4 low-dose aspirin. Wait for an ambulance. Do not try to drive yourself.  ?? You have signs of a stroke. These may include:  ?? Sudden numbness, paralysis, or weakness in your face, arm, or leg, especially on only one side of your body.  ?? New problems with walking or balance.  ?? Sudden vision changes.  ?? Drooling or slurred speech.  ?? New problems speaking or understanding simple statements, or feeling confused.  ?? A sudden,  severe headache that is different from past headaches.  ?? You passed out (lost consciousness).  Call your doctor now or seek immediate medical care if:  ?? You have muscle pain or weakness.  Watch closely for changes in your health, and be sure to contact your doctor if:  ?? You are very tired.  ?? You have an upset stomach, gas, constipation, or belly pain or cramps.   Where can you learn more?   Go to [email or web address]  Enter C406 in the search box to learn more about "Hyperlipidemia: After Your Visit."   ?? 2006-2013 [employer]. Care instructions adapted under license by [employer] (which disclaims liability or warranty for this information). This care instruction is for use with your licensed healthcare professional. If you have questions about a medical condition or this instruction, always ask your healthcare professional. [employer] disclaims any warranty or liability for your use of this information.  Content Version: 9.9.209917; Last Revised: July 28, 2010              Learning About High Blood Sugar  What is high blood sugar?  Your body turns the food you eat into glucose (sugar), which it uses for energy. But if your body isn't able to use the sugar right away, it can build up in your blood and lead to  high blood sugar.  When the amount of sugar in your blood stays too high for too much of the time, you may have diabetes. Diabetes is a disease that can cause serious health problems.  The good news is that lifestyle changes may help you get your blood sugar back to normal and avoid or delay diabetes.  What causes high blood sugar?  Sugar (glucose) can build up in your blood if you:  ?? Are overweight.  ?? Have a family history of diabetes.  ?? Take certain medicines, such as steroids.  What are the symptoms?  Having high blood sugar may not cause any symptoms at all. Or it may make you feel very thirsty or very hungry. You may also urinate more often than usual,  have blurry vision, or lose weight without trying.  How is high blood sugar treated?  You can take steps to lower your blood sugar level if you understand what makes it get higher. Your doctor may want you to learn how to test your blood sugar level at home. Then you can see how illness, stress, or different kinds of food or medicine raise or lower your blood sugar level.  Other tests may be needed to see if you have diabetes.  How can you prevent high blood sugar?  ?? Watch your weight. If you're overweight, losing just a small amount of weight may help. Reducing fat around your waist is most important.  ?? Eat a balanced diet. This may help you prevent or delay diabetes. Try to eat an even amount of carbohydrate throughout the day. This can help you avoid sudden peaks in blood sugar.  ?? Ask your doctor if you should see a dietitian. A registered dietitian can help you develop a meal plan that fits your lifestyle.  ?? Get at least 30 minutes of exercise on most days of the week. Exercise helps control your blood sugar. It also helps you maintain a healthy weight. Walking is a good choice. You also may want to do other activities, such as running, swimming, cycling, or playing tennis or team sports.  ?? If your doctor prescribed medicines, take them exactly as prescribed. Call your doctor if you think you are having a problem with your medicine. You will get more details on the specific medicines your doctor prescribes.  Follow-up care is a key part of your treatment and safety. Be sure to make and go to all appointments, and call your doctor if you are having problems. It's also a good idea to know your test results and keep a list of the medicines you take.   Where can you learn more?   Go to [email or web address]  Enter O108 in the search box to learn more about "Learning About High Blood Sugar."   ?? 2006-2014 [employer]. Care instructions adapted under license by [employer] (which  disclaims liability or warranty for this information). This care instruction is for use with your licensed healthcare professional. If you have questions about a medical condition or this instruction, always ask your healthcare professional. [employer] disclaims any warranty or liability for your use of this information.  Content Version: 10.0.270728; Last Revised: January 23, 2011              Insulin Resistance: After Your Visit  Your Care Instructions  Insulin resistance means that the body cannot use insulin properly. Insulin lets sugar (glucose) enter the body's cells, where it is used for energy. It also helps muscles, fat,  and liver cells store sugar to be released when needed. If the body tissues do not respond properly to insulin, the blood sugar level rises.  Insulin resistance mainly is caused by obesity, although other medical conditions, such as acromegaly and Cushing???s syndrome, also can cause it. It can run in families too.  Follow-up care is a key part of your treatment and safety. Be sure to make and go to all appointments, and call your doctor if you are having problems. It???s also a good idea to know your test results and keep a list of the medicines you take.  How can you care for yourself at home?  ?? Take your medicines exactly as prescribed. Call your doctor if you think you are having a problem with your medicine. You will get more details on the specific medicines your doctor prescribes.  ?? Eat a good diet that spreads carbohydrate throughout the day.  ?? Get at least 30 minutes of exercise on most days of the week. Exercise helps control your blood sugar. It also helps you maintain a healthy weight. Walking is a good choice. You also may want to do other activities, such as running, swimming, cycling, or playing tennis or team sports.  ?? Try to lose weight. Losing even a small amount of weight can help.  ?? Do not smoke. If you need help quitting, talk to your doctor about  stop-smoking programs and medicines. These can increase your chances of quitting for good.  When should you call for help?  Call 911 if you have symptoms of a heart attack. These may include:  ?? Chest pain or pressure, or a strange feeling in the chest.  ?? Sweating.  ?? Shortness of breath.  ?? Nausea or vomiting.  ?? Pain, pressure, or a strange feeling in the back, neck, jaw, or upper belly or in one or both shoulders or arms.  ?? Lightheadedness or sudden weakness.  ?? A fast or irregular heartbeat.  After calling 911, the operator may tell you to chew 1 adult-strength or 2 to 4 low-dose aspirin. Wait for an ambulance. Do not try to drive yourself.  Call 911 if you have signs of a stroke, such as:  ?? Sudden numbness, paralysis, or weakness in your face, arm, or leg, especially on only one side of your body.  ?? New problems with walking or balance.  ?? Sudden vision changes.  ?? Drooling or slurred speech.  ?? New problems speaking or understanding simple statements, or feeling confused.  ?? A sudden, severe headache that is different from past headaches.  Call your doctor now or seek immediate medical care if:  ?? You have any symptoms of diabetes. These may include:  ?? Being thirsty more often.  ?? Urinating more.  ?? Being hungrier.  ?? Losing weight.  ?? Being very tired.  ?? Having blurry vision.  ?? You have a wound that will not heal.  ?? You have an infection that will not go away.  ?? You have problems with your blood pressure.  Watch closely for changes in your health, and be sure to contact your doctor if you have any problems.   Where can you learn more?   Go to [email or web address]  Enter V537 in the search box to learn more about "Insulin Resistance: After Your Visit."   ?? 2006-2014 [employer]. Care instructions adapted under license by [employer] (which disclaims liability or warranty for this information). This care instruction is for use with  your licensed healthcare professional. If  you have questions about a medical condition or this instruction, always ask your healthcare professional. [employer] disclaims any warranty or liability for your use of this information.  Content Version: 10.0.270728; Last Revised: December 05, 2011              Insomnia: After Your Visit  Your Care Instructions  Insomnia is the inability to sleep well. It is a common problem for most people at some time. Insomnia may make it hard for you to get to sleep, stay asleep, or sleep as long as you need to. This can make you tired and grouchy during the day. It can also make you forgetful, less effective at work, and unhappy.  Insomnia can be caused by conditions such as depression or anxiety. Pain can also affect your ability to sleep. When these problems are solved, the insomnia usually clears up. But sometimes bad sleep habits can cause insomnia.  If insomnia is affecting your work or your enjoyment of life, you can take steps to improve your sleep.  Follow-up care is a key part of your treatment and safety. Be sure to make and go to all appointments, and call your doctor if you are having problems. It???s also a good idea to know your test results and keep a list of the medicines you take.  How can you care for yourself at home?  What to avoid  ?? Do not have drinks with caffeine, such as coffee or black tea, for 8 hours before bed.  ?? Do not smoke or use other types of tobacco near bedtime. Nicotine is a stimulant and can keep you awake.  ?? Avoid drinking alcohol late in the evening, because it can cause you to wake in the middle of the night.  ?? Do not eat a big meal close to bedtime. If you are hungry, eat a light snack.  ?? Do not drink a lot of water close to bedtime, because the need to urinate may wake you up during the night.  ?? Do not read or watch TV in bed. Use the bed only for sleeping and sexual activity.  What to try  ?? Go to bed at the same time every night, and wake up at the same time every  morning. Do not take naps during the day.  ?? Keep your bedroom quiet, dark, and cool.  ?? Get regular exercise, but not within 3 to 4 hours of your bedtime.  ?? Sleep on a comfortable pillow and mattress.  ?? If watching the clock makes you anxious, turn it facing away from you so you cannot see the time.  ?? If you worry when you lie down, start a worry book. Well before bedtime, write down your worries, and then set the book and your concerns aside.  ?? Try meditation or other relaxation techniques before you go to bed.  ?? If you cannot fall asleep, get up and go to another room until you feel sleepy. Do something relaxing. Repeat your bedtime routine before you go to bed again.  ?? Make your house quiet and calm about an hour before bedtime. Turn down the lights, turn off the TV, log off the computer, and turn down the volume on music. This can help you relax after a busy day.  When should you call for help?  Watch closely for changes in your health, and be sure to contact your doctor if:  ?? Your efforts to improve  your sleep do not work.  ?? Your insomnia gets worse.  ?? You have been feeling down, depressed, or hopeless or have lost interest in things that you usually enjoy.   Where can you learn more?   Go to [email or web address]  Enter P513 in the search box to learn more about "Insomnia: After Your Visit."   ?? 2006-2014 [employer]. Care instructions adapted under license by [employer] (which disclaims liability or warranty for this information). This care instruction is for use with your licensed healthcare professional. If you have questions about a medical condition or this instruction, always ask your healthcare professional. [employer] disclaims any warranty or liability for your use of this information.  Content Version: 10.0.270728; Last Revised: November 29, 2011    SLEEP HYGIENE    1.  Try to maintain a regular schedule for bedtime, rise time, meals, exercise,  chores, etc.  The body's internal clock or Circadian Rhythm works best when set schedules are kept.  If you stay up later on weekends, try not to sleep more than an hour past your usual wake time.  ALLOW AT LEAST 7 HOURS UNINTERRUPTED SLEEP.    2.  Try an OTC sleep agent (Unisom, Benadryl, Melatonin 0.5-1 mg, etc.) nightly for 2 weeks, if okay with your doctor.  Take 1 pill 30 minutes before your chosen bedtime.  This will help to reset your sleep/wake cycle.  Absolutely no driving once this medicine is taken.      3.  AVOID stimulants such as caffeine (coffee, tea, cocoa, chocolate, colas) and medications like No Doz or ADHD medication after lunchtime or 4-8 hours before bedtime.  Heavy caffeine use early in the day can also disrupt sleep.    4.  AVOID alcoholic beverages 6 hours prior to bedtime.  It can cause frequent awakenings in the night as your body gets rid of the alcohol.    5.  AVOID heavy meals before bedtime.  Try to eat dinner 2-3 hours before bedtime.  A light snack which includes dairy products is a good idea since these chemicals (tryptophan) promote drowsiness.  Do not do this if you are intolerant to dairy products.    6.  AVOID exercising before bedtime.  Exercise should be performed preferably in light during the day.    7.  AVOID napping during the day, especially after 3 pm.  If a nap is needed, limit daytime naps to 1 hour.    8.  AVOID nicotine prior to bed.  Withdrawal from nicotine can cause temporary sleep disruptions.  Once withdrawal is complete, the ex-smoker will probably find that sleep comes faster.    9.  AVOID laying in bed awake for long periods of time.  Ger up, do a simple activity, then return to the bedroom when you are drowsy.    10.  AVOID clock watching.  Remove easily visible clocks from the room or place them where they are not easily visible.  Clock watching adds stress to this situation.    11.  AVOID bright lights and loud music in the bedroom.  Make the room dark and  quiet.  Sleep is the only thing you should do in your bed.  Light signals your brain to be alert.    12.  AVOID warm rooms when trying to sleep.  Keep the bedroom well-ventilated and on the cool side.  Feeling too warm can cause night time waking.    13. AVOID drinking large amounts of water  at bedtime.  Decrease beverage consumption  2-3 hours before bedtime.   Use the bathroom to empty your bladder before getting into bed.    14.  Take an over the counter sleep agent such as Tylenol PM or Advil PM if pain is keeping you awake at night, if okay with your doctor.            Insomnia: After Your Visit  Your Care Instructions  Insomnia is the inability to sleep well. It is a common problem for most people at some time. Insomnia may make it hard for you to get to sleep, stay asleep, or sleep as long as you need to. This can make you tired and grouchy during the day. It can also make you forgetful, less effective at work, and unhappy.  Insomnia can be caused by conditions such as depression or anxiety. Pain can also affect your ability to sleep. When these problems are solved, the insomnia usually clears up. But sometimes bad sleep habits can cause insomnia.  If insomnia is affecting your work or your enjoyment of life, you can take steps to improve your sleep.  Follow-up care is a key part of your treatment and safety. Be sure to make and go to all appointments, and call your doctor if you are having problems. It???s also a good idea to know your test results and keep a list of the medicines you take.  How can you care for yourself at home?  What to avoid  ?? Do not have drinks with caffeine, such as coffee or black tea, for 8 hours before bed.  ?? Do not smoke or use other types of tobacco near bedtime. Nicotine is a stimulant and can keep you awake.  ?? Avoid drinking alcohol late in the evening, because it can cause you to wake in the middle of the night.  ?? Do not eat a big meal close to bedtime. If you are hungry, eat  a light snack.  ?? Do not drink a lot of water close to bedtime, because the need to urinate may wake you up during the night.  ?? Do not read or watch TV in bed. Use the bed only for sleeping and sexual activity.  What to try  ?? Go to bed at the same time every night, and wake up at the same time every morning. Do not take naps during the day.  ?? Keep your bedroom quiet, dark, and cool.  ?? Get regular exercise, but not within 3 to 4 hours of your bedtime.  ?? Sleep on a comfortable pillow and mattress.  ?? If watching the clock makes you anxious, turn it facing away from you so you cannot see the time.  ?? If you worry when you lie down, start a worry book. Well before bedtime, write down your worries, and then set the book and your concerns aside.  ?? Try meditation or other relaxation techniques before you go to bed.  ?? If you cannot fall asleep, get up and go to another room until you feel sleepy. Do something relaxing. Repeat your bedtime routine before you go to bed again.  ?? Make your house quiet and calm about an hour before bedtime. Turn down the lights, turn off the TV, log off the computer, and turn down the volume on music. This can help you relax after a busy day.  When should you call for help?  Watch closely for changes in your health, and be sure  to contact your doctor if:  ?? Your efforts to improve your sleep do not work.  ?? Your insomnia gets worse.  ?? You have been feeling down, depressed, or hopeless or have lost interest in things that you usually enjoy.   Where can you learn more?   Go to [email or web address]  Enter P513 in the search box to learn more about "Insomnia: After Your Visit."   ?? 2006-2014 [employer]. Care instructions adapted under license by [employer] (which disclaims liability or warranty for this information). This care instruction is for use with your licensed healthcare professional. If you have questions about a medical condition or this instruction,  always ask your healthcare professional. [employer] disclaims any warranty or liability for your use of this information.  Content Version: 10.0.270728; Last Revised: November 29, 2011

## 2013-03-16 LAB — CULTURE, URINE
Urine Culture, Routine: NO GROWTH
Urine Culture, Routine: NO GROWTH

## 2013-03-16 LAB — MICROSCOPIC EXAMINATION: Bacteria: NONE SEEN

## 2013-03-16 LAB — URINALYSIS W/ RFLX MICROSCOPIC
Bilirubin: NEGATIVE
Blood: NEGATIVE
Glucose: NEGATIVE
Ketone: NEGATIVE
Leukocyte Esterase: NEGATIVE
Nitrites: NEGATIVE
Specific Gravity: 1.023 (ref 1.005–1.030)
Urobilinogen: 0.2 mg/dL (ref 0.0–1.9)
pH (UA): 6.5 (ref 5.0–7.5)

## 2013-03-16 LAB — MICROALBUMIN:CREATININE RATIO, RANDOM URINE
Creatinine, urine random: 161.2 mg/dL (ref 15.0–328.0)
Microalb/Creat ratio (ug/mg creat.): 6.3 mg/g creat (ref 0.0–30.0)
Microalbumin, urine: 10.1 ug/mL (ref 0.0–17.0)

## 2013-03-17 NOTE — Progress Notes (Signed)
Quick Note:    Discussed with patient at ov.    ______

## 2013-03-24 DIAGNOSIS — E0590 Thyrotoxicosis, unspecified without thyrotoxic crisis or storm: Secondary | ICD-10-CM | POA: Diagnosis not present

## 2013-03-24 DIAGNOSIS — E042 Nontoxic multinodular goiter: Secondary | ICD-10-CM | POA: Diagnosis not present

## 2013-03-25 DIAGNOSIS — E042 Nontoxic multinodular goiter: Secondary | ICD-10-CM | POA: Diagnosis not present

## 2013-03-26 NOTE — Progress Notes (Signed)
Quick Note:    Your protein level in your urine is elevated. This suggests microscopic damage to your kidneys. Please avoid use of Nonsteroidal products (ie. Motrin, aleve, advil, ibuprofen) and keep a normal blood pressure. Recheck urine at your next appointment.    No UTI  ______

## 2013-03-27 NOTE — Progress Notes (Signed)
Quick Note:    Copy of labs mailed to the patient.  ______

## 2013-04-07 DIAGNOSIS — E042 Nontoxic multinodular goiter: Secondary | ICD-10-CM | POA: Diagnosis not present

## 2013-04-07 DIAGNOSIS — E0590 Thyrotoxicosis, unspecified without thyrotoxic crisis or storm: Secondary | ICD-10-CM | POA: Diagnosis not present

## 2013-04-08 LAB — T4, FREE: T4, Free: 1.38 ng/dL (ref 0.82–1.77)

## 2013-04-08 LAB — TSH 3RD GENERATION: TSH: 0.02 u[IU]/mL — ABNORMAL LOW (ref 0.450–4.500)

## 2013-04-10 DIAGNOSIS — I10 Essential (primary) hypertension: Secondary | ICD-10-CM | POA: Diagnosis not present

## 2013-04-10 DIAGNOSIS — E042 Nontoxic multinodular goiter: Secondary | ICD-10-CM | POA: Diagnosis not present

## 2013-04-10 DIAGNOSIS — E0590 Thyrotoxicosis, unspecified without thyrotoxic crisis or storm: Secondary | ICD-10-CM | POA: Diagnosis not present

## 2013-04-10 MED ORDER — ATENOLOL 25 MG TAB
25 mg | ORAL_TABLET | Freq: Two times a day (BID) | ORAL | Status: DC
Start: 2013-04-10 — End: 2014-03-24

## 2013-04-10 MED ORDER — ATENOLOL 25 MG TAB
25 mg | ORAL_TABLET | Freq: Two times a day (BID) | ORAL | Status: DC
Start: 2013-04-10 — End: 2013-04-10

## 2013-04-10 NOTE — Progress Notes (Signed)
History of Present Illness: Kathryn [name] is a 77 y.o. female presents for follow-up of thyroid nodules.  She was last seen in 2012.  2 right sided nodules (right upper and right lower nodules) had FNA biopsy performed in 2011 and were benign.    TSH has been followed by PCP and this has been decreasing over the last year and a half.  She is moving to [place] in the next month to be closer to family    Prior to today's visit she has labs and thyroid scan done  She has been feeling more anxious, tremulous and is also having some trouble with insomnia. No major increase in heat intolerance or change in BMs  + some palpitations    No changes in her neck, specifically, she denied dysphagia, discomfort or hoarseness. .    Past Medical History   Diagnosis Date   ??? Edema      left ankle   ??? DDD (degenerative disc disease) 10/1999     L 4-5 by MRI.  Dr. Shiela Mayer.  Dr. Robie Ridge, Pain   ??? Urinary incontinence, stress      mild.  Dr. Winnifred Friar   ??? Polyp of rectum      Dr. Reed Pandy   ??? Cystocele      Dr. Winnifred Friar   ??? Postmenopausal HRT (hormone replacement therapy)      Hx  for 6 years.  Dr. Lavone Nian   ??? Morton's neuroma 2000     Left.  Dr. Gillis Santa   ??? Hematuria of undiagnosed cause 1980, 2009   ??? Single renal cyst 02/12/2008     Left.  0.9 cm   ??? Subchondral cysts 2009     Left hip femoral heads.  Dr. Adah Perl   ??? DJD (degenerative joint disease) 05/26/04     Dr. Shiela Mayer.   ??? Hypercholesterolemia    ??? Hypertension    ??? Kidney stone 12/2002     Dr. Gillis Ends   ??? Chickenpox childhood   ??? Rotator cuff tendinitis 12/20/04     Right.  Dr. Jen Mow   ??? Benign positional vertigo 01/07/04     Dr. Minette Headland   ??? SOB (shortness of breath) 07/11/04     Dr. Abigail Miyamoto.  Neg Stress Cardiolite.  EF 60 %.   ??? Unspecified vitamin D deficiency 2008   ??? Herpes zoster 02/26/07, 06/21/07, 08/27/07     Left buttocks then Right T2-3 dermatome   ??? Herpes simplex type 2 infection 10/05/08     Left  buttocks   ??? Cataract 2002     Dr. Theodoro Grist   ??? Multinodular goiter 10/2009     Dr. Lewayne Bunting.  benign.   ??? Internal carotid artery stenosis 09/18/11     Left.  16-49%.     ??? Basal cell cancer 02/2012     left lower leg.  Left foot.  Dr. Tilden Fossa.   ??? Near syncope 02/26/12     Negative Stress Test.  due to low blood sugar.  Dr. Meredith Mody   ??? Osteopenia 06/27/12     [facility]   ??? Hip pain 9604,5409     Right.  due to DJD.  Dr. Birdena Crandall.   ??? Facial pain 2013     due to sinusitis.  Dr. Claria Dice.     Current Outpatient Prescriptions   Medication Sig   ??? Omega-3-DHA-EPA-Fish Oil 1,000 (120-180)  mg cap Take  by mouth. "turtle oil"   ??? B.infantis-B.ani-B.long-B.bifi (PROBIOTIC 4X) 10-15 mg TbEC Take  by mouth.   ??? cholecalciferol, vitamin D3, (VITAMIN D3) 2,000 unit tab Take  by mouth.   ??? raloxifene (EVISTA) 60 mg tablet TAKE 1 TABLET EVERY DAY FOR BONES   ??? valACYclovir (VALTREX) 500 mg tablet Take 1 Tab by mouth daily. For HSV   ??? valsartan-hydrochlorothiazide (DIOVAN-HCT) 160-12.5 mg per tablet Take 1 Tab by mouth daily.   ??? pravastatin (PRAVACHOL) 80 mg tablet TAKE 1 TABLET BY MOUTH EVERY DAY   ??? mometasone (NASONEX) 50 mcg/actuation nasal spray 2 Sprays by Both Nostrils route daily.   ??? aspirin (ASPIRIN) 325 mg tablet Take 325 mg by mouth daily.     ??? calcium-cholecalciferol, d3, (CALCIUM 600 + D) 600-125 mg-unit Tab Take  by mouth.     ??? calcium 500 mg Tab Take  by mouth.     ??? magnesium 250 mg Tab Take  by mouth. Unsure of exact strength    ??? b complex vitamins tablet Take 1 Tab by mouth daily.     ??? ascorbic acid (VITAMIN C) 500 mg tablet Take 500 mg by mouth nightly.   ??? multivitamin (ONE A DAY) tablet Take 1 Tab by mouth daily.     No current facility-administered medications for this visit.     Allergies   Allergen Reactions   ??? Other Medication Rash     All cillins   ??? Penicillins Rash and Swelling   ??? Tetracycline Itching   ??? Bactrim (Sulfamethoprim Ds)  Itching   ??? Celebrex (Celecoxib) Other (comments)     Memory disturbance, slowed thinking   ??? Crestor (Rosuvastatin) Other (comments)     Elevated CPK   ??? Lipitor (Atorvastatin) Other (comments)     Leg cramps   ??? Niaspan (Niacin) Other (comments)     Caused elevated BP   ??? Zocor (Simvastatin) Other (comments)     Leg cramps       Physical Examination:  - Blood pressure 116/54, pulse 86, height 5' 1.8" (1.57 m), weight 151 lb (68.493 kg), SpO2 98.00%.  - General: pleasant, no distress, normal gait   HEENT: hearing intact, EOMI, clear sclera without icterus  - Neck: no thyromegaly or LAD  - Cardiovascular: regular, normal rate  - Respiratory: normal effort  - Integumentary: no edema   Neuro - + mild tremor  - Psychiatric: normal mood and affect    Data Reviewed:   Component      Latest Ref Rng 04/07/2013 04/07/2013 03/14/2013 12/24/2012          11:19 AM 11:19 AM 11:04 AM  9:17 AM   TSH      0.450 - 4.500 uIU/mL 0.020 (L)   0.201 (L)   T4, Free      0.82 - 1.77 ng/dL  1.91     Urine Culture, Routine         No growth      Component      Latest Ref Rng 12/24/2012           9:17 AM   TSH      0.450 - 4.500 uIU/mL    T4, Free      0.82 - 1.77 ng/dL 4.78   Urine Culture, Routine            Thyroid scan:  EXAM: Nuclear thyroid scan and uptake are obtained 24 hours following p.o.   administration of  301 uCi Iodine 123.   INDICATION: 241.1, 242.90, Subclinical hyperthyroidism and thyroid nodules.   FINDINGS:   24-hour Uptake is 30% (normal: 10% - 30%).   Images of the gland in 3 projections show diffusely nonuniform uptake   consistent with known multinodular gland, without apparent discrete/dominant   hot or cold nodule.   IMPRESSION:   1. Borderline elevated 24-hour uptake.   2. Nonuniform gland consistent with multinodularity, without discrete hot or   cold nodule identified.       Assessment/Plan:   1. Multinodular goiter   - benign nodules on right  - reviewed images of nuclear scan personally with her - right upper nodule  and left lower nodule appear to be hyperfunctioning. No 'cold' areas.  - will plan on repeating ultrasound 1-2 years after nuclear scan   2. HTN (hypertension)   - BP controlled on valsartan/HCT  - with addition of atenolol for sx control, recommended she take 1/2 tablet of valsartan/HCt   3. Hyperthyroidism   - she does have sx.   - Reviewed lab - TSH progressively decreasing and free T4 progressively increasing over last 1.5 years.  - Discussed risks of osteoporosis and afib with hyperthyroidism  - Handouts from [employer] given and reviewed on thyroid function tests and hyperthyroidism. Reviewed sx of hyperthyroidism, of which she has several. Discussed causes of hyperthyroidism and pros and cons of different treatments, including RAI, methimazole and surgery.    Favor RAI for hyperthyroidism due to nodular disease  - will plan for RAI ablation  - labs in 3 months and again in 5 prior to follow-up  - discussed risk for hypothyroidism  - atenolol 25 mg daily for sx control. Can increase to 25 mg BID if needed.       Patient Instructions     Thyroid nodules:  - these had benign biopsies in the past  - Now two nodules are making too much thyroid hormone    This is the cause of the hyperthyroidism    The radioactive iodine treatment should treat the hyperthyroidism and shrink these nodules    It is possible that this may make your thyroid a little under active  **Please have labs repeated about 3 months after the radioactive iodine (late September/early October)    For symptoms:  Start atenolol 25 mg daily. Can take twice daily if you feel you need to  When your thyroid function returns to normal, I will have you taper off of this    While taking atenolol, take 1/2 of valsartan/HCT tablet          Follow-up Disposition:  Return in about 5 months (around 09/10/2013), or if symptoms worsen or fail to improve.

## 2013-04-10 NOTE — Patient Instructions (Signed)
Thyroid nodules:  - these had benign biopsies in the past  - Now two nodules are making too much thyroid hormone    This is the cause of the hyperthyroidism    The radioactive iodine treatment should treat the hyperthyroidism and shrink these nodules    It is possible that this may make your thyroid a little under active  **Please have labs repeated about 3 months after the radioactive iodine (late September/early October)    For symptoms:  Start atenolol 25 mg daily. Can take twice daily if you feel you need to  When your thyroid function returns to normal, I will have you taper off of this    While taking atenolol, take 1/2 of valsartan/HCT tablet

## 2013-04-11 DIAGNOSIS — M51379 Other intervertebral disc degeneration, lumbosacral region without mention of lumbar back pain or lower extremity pain: Secondary | ICD-10-CM | POA: Diagnosis not present

## 2013-04-11 DIAGNOSIS — M62838 Other muscle spasm: Secondary | ICD-10-CM | POA: Diagnosis not present

## 2013-04-11 DIAGNOSIS — M5137 Other intervertebral disc degeneration, lumbosacral region: Secondary | ICD-10-CM | POA: Diagnosis not present

## 2013-04-11 DIAGNOSIS — IMO0002 Reserved for concepts with insufficient information to code with codable children: Secondary | ICD-10-CM | POA: Diagnosis not present

## 2013-04-11 DIAGNOSIS — M999 Biomechanical lesion, unspecified: Secondary | ICD-10-CM | POA: Diagnosis not present

## 2013-04-25 DIAGNOSIS — E042 Nontoxic multinodular goiter: Secondary | ICD-10-CM | POA: Diagnosis not present

## 2013-04-25 DIAGNOSIS — E0590 Thyrotoxicosis, unspecified without thyrotoxic crisis or storm: Secondary | ICD-10-CM | POA: Diagnosis not present

## 2013-06-04 ENCOUNTER — Ambulatory Visit (INDEPENDENT_AMBULATORY_CARE_PROVIDER_SITE_OTHER): Payer: Medicare Other | Admitting: Family

## 2013-06-04 ENCOUNTER — Encounter: Payer: Self-pay | Admitting: Family

## 2013-06-04 VITALS — BP 120/68 | HR 63 | Ht 61.5 in | Wt 150.0 lb

## 2013-06-04 DIAGNOSIS — I10 Essential (primary) hypertension: Secondary | ICD-10-CM | POA: Diagnosis not present

## 2013-06-04 DIAGNOSIS — M8580 Other specified disorders of bone density and structure, unspecified site: Secondary | ICD-10-CM

## 2013-06-04 DIAGNOSIS — E7800 Pure hypercholesterolemia, unspecified: Secondary | ICD-10-CM

## 2013-06-04 DIAGNOSIS — M899 Disorder of bone, unspecified: Secondary | ICD-10-CM

## 2013-06-04 DIAGNOSIS — E039 Hypothyroidism, unspecified: Secondary | ICD-10-CM | POA: Diagnosis not present

## 2013-06-04 DIAGNOSIS — A6000 Herpesviral infection of urogenital system, unspecified: Secondary | ICD-10-CM

## 2013-06-04 MED ORDER — ALENDRONATE SODIUM 70 MG PO TABS: 70.0000 mg | ORAL_TABLET | ORAL | Status: DC

## 2013-06-04 NOTE — Progress Notes (Signed)
Subjective:    Patient ID: Felicia Kelly, female    DOB: Sep 07, 1936, 77 y.o.   MRN: [id number]  HPI 77 year old female, new patient to the practice and to be established.She is relocating here from [place]. She has a history of hypertension, hyperlipidemia, hyperthyroidism with treatment and is currently hypothyroid, general herpes, and osteopenia. Typically Evista but reports not being able to tolerate it. She has a goiter and is still under the care of her endocrinologist in [place]. She will continue to see that specialist. Reports being married 40 years and lost her husband to liver cancer 3 years ago. Her family encouraged her to move to [place] since she does not have family in [place]. In the past, she had 2 hip replacements and recovered well.  Last colonoscopy 2009. Mammogram December 2013. Eye exam November 2013.   Review of Systems  Constitutional: Negative.   HENT: Negative.   Respiratory: Negative.   Cardiovascular: Negative.   Gastrointestinal: Negative.   Endocrine: Negative.   Genitourinary: Negative.   Musculoskeletal: Negative.   Skin: Negative.   Neurological: Negative.   Hematological: Negative.   Psychiatric/Behavioral: Negative.    Past Medical History  Diagnosis Date  . Arthritis   . Hypertension   . Hyperlipidemia   . Thyroid disease   . Colonic polyp     History   Social History  . Marital Status: Widowed    Spouse Name: N/A    Number of Children: N/A  . Years of Education: N/A   Occupational History  . Not on file.   Social History Main Topics  . Smoking status: Never Smoker   . Smokeless tobacco: Not on file  . Alcohol Use: No  . Drug Use: No  . Sexual Activity: Not on file   Other Topics Concern  . Not on file   Social History Narrative  . No narrative on file    Past Surgical History  Procedure Laterality Date  . Breast surgery  1985    biopsy  . Appendectomy  1988  . Abdominal hysterectomy  1988  . Total  hip arthroplasty    . Foot surgery      Family History  Problem Relation Age of Onset  . Arthritis Mother   . Hyperlipidemia Mother   . Hypertension Mother   . Heart disease Mother   . Diabetes Mother   . Cancer Father     pancreatic  . Heart attack Sister 33    Allergies  Allergen Reactions  . Bactrim [Sulfamethoxazole W-Trimethoprim] Itching  . Celebrex [Celecoxib]     Memory disturbance, slowed thinking  . Crestor [Rosuvastatin]   . Lipitor [Atorvastatin]     Leg cramps  . Niaspan [Niacin Er]     Elevated BP  . Tetracyclines & Related Itching  . Zocor [Simvastatin]     Leg cramps  . Penicillins Swelling    ALL CILLINS    No current outpatient prescriptions on file prior to visit.   No current facility-administered medications on file prior to visit.    BP 120/68  Pulse 63  Ht 5' 1.5" (1.562 m)  Wt 150 lb (68.04 kg)  BMI 27.89 kg/m2  SpO2 98%chart    Objective:   Physical Exam  Constitutional: She is oriented to person, place, and time. She appears well-developed and well-nourished.  HENT:  Right Ear: External ear normal.  Left Ear: External ear normal.  Nose: Nose normal.  Mouth/Throat: Oropharynx is clear and moist.  Neck:  Normal range of motion. Neck supple.  Small goiter to palpation.   Cardiovascular: Normal rate, regular rhythm and normal heart sounds.   Pulmonary/Chest: Effort normal and breath sounds normal.  Abdominal: Soft. Bowel sounds are normal.  Musculoskeletal: Normal range of motion.  Neurological: She is alert and oriented to person, place, and time.  Skin: Skin is warm and dry.  Psychiatric: She has a normal mood and affect.          Assessment & Plan:  Assessment: 1. Hypothyroidism 2. Hyperlipidemia 3. Osteopenia 4. Hypercholesterolemia 5. Hypertension  Plan: Patient will obtain labs from her previous PCP and bring them to [place]. She is planning to continue to see endocrinology in [place]. Return in 4 months for fasting  labs and complete physical exam. DC Evista and start Fosamax 70 mg once weekly.

## 2013-06-25 ENCOUNTER — Encounter

## 2013-06-25 NOTE — Telephone Encounter (Addendum)
Message copied by Neldon Labella on Wed Jun 25, 2013  4:35 PM  ------       Message from: Lewayne Bunting D       Created: Wed Jun 25, 2013  4:06 PM       Regarding: RE: Call back about info to be given       Contact: [phone]         Yes, Free T4 and TSH. She had RAI ablation about 2 weeks ago. Sometimes thyroid hormone levels can increase a little before decreasing. Other times, RAI can work very fast and levels can be dropping.       We can check for this.       ----- Message -----          From: Hanley Ben, LPN          Sent: 06/25/2013   3:40 PM            To: Myrna Blazer, MD       Subject: FW: Call back about info to be given                         I returned patient's call. She moved to [facility] and has found a PCP but has not seen her yet.  Ankle swelling started 3-4 weeks ago. Blood pressure is generally 138-140/ 70-80 which is higher than in the past. Feels heart is racing from time to time but heart rate is generally around 68. Taking atenolol 25 mg twice daily, valsartan-hydrochlorothiazide 160-12.5 mg  1/2 tab daily. She asked if thyroid labs need to be checked?         Follow up 09/04/13              ----- Message -----          From: Kandis Mannan Rufus-Legette          Sent: 06/25/2013   2:25 PM            To: Hanley Ben, LPN       Subject: Call back about info to be given                           Pt would like a call back from the nurse to relay some information. Pt did not disclose information that she wanted to relay. She can be reached at [phone]                       ------  I called patient and read Dr. Patsey Berthold recommendation and she voiced understanding. Lab order mailed, address confirmed.

## 2013-07-01 ENCOUNTER — Telehealth: Payer: Self-pay | Admitting: Family

## 2013-07-01 ENCOUNTER — Other Ambulatory Visit (INDEPENDENT_AMBULATORY_CARE_PROVIDER_SITE_OTHER): Payer: Medicare Other

## 2013-07-01 DIAGNOSIS — E039 Hypothyroidism, unspecified: Secondary | ICD-10-CM

## 2013-07-01 LAB — TSH: TSH: 0.01 u[IU]/mL — ABNORMAL LOW (ref 0.35–5.50)

## 2013-07-01 LAB — T4, FREE: Free T4: 1.3 ng/dL (ref 0.60–1.60)

## 2013-07-01 LAB — T3, FREE: T3, Free: 3.9 pg/mL (ref 2.3–4.2)

## 2013-07-01 NOTE — Telephone Encounter (Signed)
Patient has external order for labs for Endocrinologist Dr. Lewayne Bunting [place], [place]. Orders are for: TSH, 3rd generation. T4, free.  She is hand carrying the order in to the lab today. She states she spoke with you about this before and you said it was ok to do here. She would like results to go to you, as well as be faxed to Dr. Adriana Simas - will bring fax # with the order.

## 2013-07-02 ENCOUNTER — Telehealth: Payer: Self-pay | Admitting: Family

## 2013-07-02 NOTE — Telephone Encounter (Signed)
I was unaware that a copy is suppose to go to Dr. Adriana Simas. Pt needs to complete medical release form before sending this. Is pt changing practices? The Dr. Adriana Simas that comes up from [employer] is in [facility]. If pt is switching, we need to know

## 2013-07-02 NOTE — Telephone Encounter (Signed)
ok 

## 2013-07-02 NOTE — Telephone Encounter (Signed)
Pt would like to know if you sent a copy of thyroid test results  to Dr Adriana Simas in [place], [place]. Pls advise.

## 2013-07-02 NOTE — Telephone Encounter (Signed)
Pt states this MD is an endocrinologist. (his twin brother is family practice) Pt did not want to switch endocrinologist until next year. Ms Orvan Falconer told pt to come here and get a report so both could have). Pt states she put  on requisition from [facility] to go to Dr Adriana Simas. Pt states she made it clear that this was to go to Dr Adriana Simas. Dr Lewayne Bunting  Fax: [phone] Phone: [phone]  Pt will bring her copy to you tomorrow if you need.

## 2013-07-03 NOTE — Telephone Encounter (Signed)
Copy faxed.  

## 2013-07-03 NOTE — Telephone Encounter (Signed)
Free T4 is 1.3, which is very similar to value in June and is normal    It is possible that her levels were higher and now are decreasing.  I recommend having labs repeated prior to visit in November.     She can increase valsartan/HCTZ to a whole tablet based on her blood pressure and swelling. Doing so may help

## 2013-07-24 ENCOUNTER — Encounter

## 2013-07-26 DIAGNOSIS — Z23 Encounter for immunization: Secondary | ICD-10-CM | POA: Diagnosis not present

## 2013-08-28 ENCOUNTER — Telehealth: Payer: Self-pay | Admitting: Family

## 2013-08-28 ENCOUNTER — Other Ambulatory Visit (INDEPENDENT_AMBULATORY_CARE_PROVIDER_SITE_OTHER): Payer: Medicare Other

## 2013-08-28 DIAGNOSIS — E039 Hypothyroidism, unspecified: Secondary | ICD-10-CM | POA: Diagnosis not present

## 2013-08-28 LAB — TSH: TSH: 0.01 u[IU]/mL — ABNORMAL LOW (ref 0.35–5.50)

## 2013-08-28 LAB — T4, FREE: Free T4: 1.36 ng/dL (ref 0.60–1.60)

## 2013-08-28 NOTE — Telephone Encounter (Signed)
Pt would like a copy of her lab results sent to [facility]  [address]  [place] [address]   ([phone] and (f) [phone]   Dr. Lewayne Bunting.  She is having her lab drawn today, 08/28/13.

## 2013-09-01 NOTE — Telephone Encounter (Signed)
results faxed 

## 2013-09-04 DIAGNOSIS — E042 Nontoxic multinodular goiter: Secondary | ICD-10-CM | POA: Diagnosis not present

## 2013-09-04 DIAGNOSIS — E0590 Thyrotoxicosis, unspecified without thyrotoxic crisis or storm: Secondary | ICD-10-CM | POA: Diagnosis not present

## 2013-09-04 NOTE — Progress Notes (Signed)
History of Present Illness: Kathryn [name] is a 77 y.o. female presents for follow-up of thyroid nodules and hyperthyroidism.  She was last seen in June 2014.   She was hyperthyroid then and nuclear scan showed uptake in nodules.    She was treated with RAI 32.8 mci on 04/25/2013     She had labs repeated in 06/2013 and 08/2013.  Free T4 was stable both times.    At June visit she was also started on atenolol 25 mg BID for symptoms related to hyperthyroidism - anxiety, tremulousness, trouble sleeping and depression. She has continued on this dose and decreased valsartan/HCT.    Mood is better. Tremulousness is much better.  Sleeping better as well.  No dizziness.    No changes in her neck, specifically, she denied dysphagia, discomfort or hoarseness.       Past Medical History   Diagnosis Date   ??? Edema      left ankle   ??? DDD (degenerative disc disease) 10/1999     L 4-5 by MRI.  Dr. Shiela Mayer.  Dr. Robie Ridge, Pain   ??? Urinary incontinence, stress      mild.  Dr. Winnifred Friar   ??? Polyp of rectum      Dr. Reed Pandy   ??? Cystocele      Dr. Winnifred Friar   ??? Postmenopausal HRT (hormone replacement therapy)      Hx  for 6 years.  Dr. Lavone Nian   ??? Morton's neuroma 2000     Left.  Dr. Gillis Santa   ??? Hematuria of undiagnosed cause 1980, 2009   ??? Single renal cyst 02/12/2008     Left.  0.9 cm   ??? Subchondral cysts 2009     Left hip femoral heads.  Dr. Adah Perl   ??? DJD (degenerative joint disease) 05/26/04     Dr. Shiela Mayer.   ??? Hypercholesterolemia    ??? Hypertension    ??? Kidney stone 12/2002     Dr. Gillis Ends   ??? Chickenpox childhood   ??? Rotator cuff tendinitis 12/20/04     Right.  Dr. Jen Mow   ??? Benign positional vertigo 01/07/04     Dr. Minette Headland   ??? SOB (shortness of breath) 07/11/04     Dr. Abigail Miyamoto.  Neg Stress Cardiolite.  EF 60 %.   ??? Unspecified vitamin D deficiency 2008   ??? Herpes zoster 02/26/07, 06/21/07, 08/27/07     Left buttocks then Right T2-3 dermatome   ??? Herpes simplex  type 2 infection 10/05/08     Left buttocks   ??? Cataract 2002     Dr. Theodoro Grist   ??? Multinodular goiter 10/2009     Dr. Lewayne Bunting.  benign.   ??? Internal carotid artery stenosis 09/18/11     Left.  16-49%.     ??? Basal cell cancer 02/2012     left lower leg.  Left foot.  Dr. Tilden Fossa.   ??? Near syncope 02/26/12     Negative Stress Test.  due to low blood sugar.  Dr. Meredith Mody   ??? Osteopenia 06/27/12     [facility]   ??? Hip pain 9147,8295     Right.  due to DJD.  Dr. Birdena Crandall.   ??? Facial pain 2013     due to sinusitis.  Dr. Claria Dice.     Current Outpatient Prescriptions   Medication Sig   ??? ALENDRONATE SODIUM (  FOSAMAX PO) Take  by mouth every seven (7) days.   ??? Omega-3-DHA-EPA-Fish Oil 1,000 (120-180) mg cap Take  by mouth. "turtle oil"   ??? B.infantis-B.ani-B.long-B.bifi (PROBIOTIC 4X) 10-15 mg TbEC Take  by mouth.   ??? atenolol (TENORMIN) 25 mg tablet Take 1 Tab by mouth two (2) times a day. For symptoms of hyperthyroidism   ??? cholecalciferol, vitamin D3, (VITAMIN D3) 2,000 unit tab Take  by mouth.   ??? valACYclovir (VALTREX) 500 mg tablet Take 1 Tab by mouth daily. For HSV   ??? valsartan-hydrochlorothiazide (DIOVAN-HCT) 160-12.5 mg per tablet Take 1 Tab by mouth daily.   ??? pravastatin (PRAVACHOL) 80 mg tablet TAKE 1 TABLET BY MOUTH EVERY DAY   ??? mometasone (NASONEX) 50 mcg/actuation nasal spray 2 Sprays by Both Nostrils route daily.   ??? aspirin (ASPIRIN) 325 mg tablet Take 325 mg by mouth daily.     ??? calcium-cholecalciferol, d3, (CALCIUM 600 + D) 600-125 mg-unit Tab Take  by mouth.     ??? calcium 500 mg Tab Take  by mouth.     ??? magnesium 250 mg Tab Take  by mouth. Unsure of exact strength    ??? b complex vitamins tablet Take 1 Tab by mouth daily.     ??? ascorbic acid (VITAMIN C) 500 mg tablet Take 500 mg by mouth nightly.   ??? multivitamin (ONE A DAY) tablet Take 1 Tab by mouth daily.   ??? raloxifene (EVISTA) 60 mg tablet TAKE 1 TABLET EVERY DAY FOR BONES     No current  facility-administered medications for this visit.     Allergies   Allergen Reactions   ??? Other Medication Rash     All cillins   ??? Penicillins Rash and Swelling   ??? Tetracycline Itching   ??? Bactrim [Sulfamethoprim Ds] Itching   ??? Celebrex [Celecoxib] Other (comments)     Memory disturbance, slowed thinking   ??? Crestor [Rosuvastatin] Other (comments)     Elevated CPK   ??? Lipitor [Atorvastatin] Other (comments)     Leg cramps   ??? Niaspan [Niacin] Other (comments)     Caused elevated BP   ??? Zocor [Simvastatin] Other (comments)     Leg cramps       Physical Examination:  - Blood pressure 113/41, pulse 65, height 5' 1.8" (1.57 m), weight 147 lb (66.679 kg).  - General: pleasant, no distress, normal gait   HEENT: hearing intact, EOMI, clear sclera without icterus  - Neck: thyroid palpable, but not appreciably enlarged. No LAD  - Cardiovascular: regular, normal rate  - Respiratory: normal effort  - Integumentary: no edema   Neuro: no significant tremors  - Psychiatric: normal mood and affect    Data Reviewed:     03/2013: Thyroid scan  EXAM: Nuclear thyroid scan and uptake are obtained 24 hours following p.o.   administration of 301 uCi Iodine 123.   INDICATION: 241.1, 242.90, Subclinical hyperthyroidism and thyroid nodules.   FINDINGS:   24-hour Uptake is 30% (normal: 10% - 30%).   Images of the gland in 3 projections show diffusely nonuniform uptake   consistent with known multinodular gland, without apparent discrete/dominant   hot or cold nodule.   IMPRESSION:   1. Borderline elevated 24-hour uptake.   2. Nonuniform gland consistent with multinodularity, without discrete hot or   cold nodule identified.     Component      Latest Ref Rng 04/07/2013 04/07/2013 12/24/2012 12/24/2012  11:19 AM 11:19 AM  9:17 AM  9:17 AM   TSH      0.450 - 4.500 uIU/mL 0.020 (L)  0.201 (L)    T4, Free      0.82 - 1.77 ng/dL  1.61  0.96     Component      Latest Ref Rng 08/26/2012 09/05/2011           8:16 AM  8:00 AM   TSH      0.450 -  4.500 uIU/mL 0.359 (L) 1.900   T4, Free      0.82 - 1.77 ng/dL     Labs from   0/4540 :        TSH 0.01, Free T4 1.30  09/01/2013: TSH 0.01, Free T4 1.36    Assessment/Plan:   1. Multinodular goiter   S/p RAI on 7/11 for hyperthyroidism  - May still not have seen full effect as she remains mildly hyperthyroid and Free T4 is unchanged  - repeat labs in 10/2012  - Will have return in June 2015 with ultrasound prior   2. Hyperthyroidism   - repeat labs in 10/2012. Nuclear scan showed this to be due to nodular disease. She is s/p RAI.  - can decrease atenolol to 12.5 mg BID as she would like to increase valsartan/HCT back up for edema     3.  HTN       - see above  Greater than 50% of 25 minute visit was spent counseling the patient about results and plan    Patient Instructions   Repeat labs in 10/2012.    Atenolol:  Decrease to 12.5 mg twice daily (1/2 of 25 mg tablet).  If pills do not break well, then take one tablet at bedtime        Follow-up Disposition:  Return in about 6 months (around 03/04/2014).

## 2013-09-04 NOTE — Progress Notes (Signed)
Patient would like a copy of her office note from today's visit to take to her PCP. If this is not ready when she leaves today, she would like it mailed to her.

## 2013-09-04 NOTE — Patient Instructions (Signed)
Repeat labs in 10/2012.    Atenolol:  Decrease to 12.5 mg twice daily (1/2 of 25 mg tablet).  If pills do not break well, then take one tablet at bedtime

## 2013-10-03 ENCOUNTER — Ambulatory Visit: Payer: Medicare Other

## 2013-10-03 ENCOUNTER — Ambulatory Visit (INDEPENDENT_AMBULATORY_CARE_PROVIDER_SITE_OTHER): Payer: Medicare Other | Admitting: Family

## 2013-10-03 ENCOUNTER — Encounter: Payer: Self-pay | Admitting: Family

## 2013-10-03 VITALS — BP 118/60 | HR 63 | Ht 61.5 in | Wt 142.5 lb

## 2013-10-03 DIAGNOSIS — Z23 Encounter for immunization: Secondary | ICD-10-CM

## 2013-10-03 DIAGNOSIS — Z1231 Encounter for screening mammogram for malignant neoplasm of breast: Secondary | ICD-10-CM | POA: Diagnosis not present

## 2013-10-03 DIAGNOSIS — E7800 Pure hypercholesterolemia, unspecified: Secondary | ICD-10-CM

## 2013-10-03 DIAGNOSIS — I10 Essential (primary) hypertension: Secondary | ICD-10-CM

## 2013-10-03 DIAGNOSIS — Z0000 Encounter for general adult medical examination without abnormal findings: Secondary | ICD-10-CM | POA: Diagnosis not present

## 2013-10-03 DIAGNOSIS — R7309 Other abnormal glucose: Secondary | ICD-10-CM

## 2013-10-03 LAB — POCT URINALYSIS DIPSTICK
Bilirubin, UA: NEGATIVE
Glucose, UA: NEGATIVE
Ketones, UA: NEGATIVE
Nitrite, UA: NEGATIVE
Protein, UA: NEGATIVE
Spec Grav, UA: 1.02
Urobilinogen, UA: 0.2
pH, UA: 6

## 2013-10-03 LAB — CBC WITH DIFFERENTIAL/PLATELET
Basophils Absolute: 0 10*3/uL (ref 0.0–0.1)
Basophils Relative: 0.3 % (ref 0.0–3.0)
Eosinophils Absolute: 0.3 10*3/uL (ref 0.0–0.7)
Eosinophils Relative: 6 % — ABNORMAL HIGH (ref 0.0–5.0)
HCT: 38.4 % (ref 36.0–46.0)
Hemoglobin: 12.6 g/dL (ref 12.0–15.0)
Lymphocytes Relative: 36.5 % (ref 12.0–46.0)
Lymphs Abs: 2.1 10*3/uL (ref 0.7–4.0)
MCHC: 32.9 g/dL (ref 30.0–36.0)
MCV: 82.3 fl (ref 78.0–100.0)
Monocytes Absolute: 0.5 10*3/uL (ref 0.1–1.0)
Monocytes Relative: 8 % (ref 3.0–12.0)
Neutro Abs: 2.8 10*3/uL (ref 1.4–7.7)
Neutrophils Relative %: 49.2 % (ref 43.0–77.0)
Platelets: 314 10*3/uL (ref 150.0–400.0)
RBC: 4.66 Mil/uL (ref 3.87–5.11)
RDW: 14 % (ref 11.5–14.6)
WBC: 5.6 10*3/uL (ref 4.5–10.5)

## 2013-10-03 LAB — LIPID PANEL
Cholesterol: 162 mg/dL (ref 0–200)
HDL: 40.1 mg/dL (ref >39.00)
LDL Cholesterol: 101 mg/dL — ABNORMAL HIGH (ref 0–99)
Total CHOL/HDL Ratio: 4
Triglycerides: 106 mg/dL (ref 0.0–149.0)
VLDL: 21.2 mg/dL (ref 0.0–40.0)

## 2013-10-03 LAB — BASIC METABOLIC PANEL
BUN: 30 mg/dL — ABNORMAL HIGH (ref 6–23)
CO2: 28 mEq/L (ref 19–32)
Calcium: 10.1 mg/dL (ref 8.4–10.5)
Chloride: 107 mEq/L (ref 96–112)
Creatinine, Ser: 0.6 mg/dL (ref 0.4–1.2)
GFR: 97.73 mL/min (ref >60.00)
Glucose, Bld: 101 mg/dL — ABNORMAL HIGH (ref 70–99)
Potassium: 4.7 mEq/L (ref 3.5–5.1)
Sodium: 142 mEq/L (ref 135–145)

## 2013-10-03 LAB — HEPATIC FUNCTION PANEL
ALT: 24 U/L (ref 0–35)
AST: 26 U/L (ref 0–37)
Albumin: 4 g/dL (ref 3.5–5.2)
Alkaline Phosphatase: 77 U/L (ref 39–117)
Bilirubin, Direct: 0 mg/dL (ref 0.0–0.3)
Total Bilirubin: 0.4 mg/dL (ref 0.3–1.2)
Total Protein: 7.3 g/dL (ref 6.0–8.3)

## 2013-10-03 LAB — HEMOGLOBIN A1C: Hgb A1c MFr Bld: 5.9 % (ref 4.6–6.5)

## 2013-10-03 MED ORDER — VALSARTAN-HYDROCHLOROTHIAZIDE 160-12.5 MG PO TABS: 0.5000 | ORAL_TABLET | Freq: Every day | ORAL | Status: DC

## 2013-10-03 MED ORDER — VALACYCLOVIR HCL 500 MG PO TABS: 500.0000 mg | ORAL_TABLET | Freq: Every day | ORAL | Status: DC

## 2013-10-03 MED ORDER — PRAVASTATIN SODIUM 80 MG PO TABS: 80.0000 mg | ORAL_TABLET | Freq: Every day | ORAL | Status: DC

## 2013-10-03 MED ORDER — ATENOLOL 25 MG PO TABS: 25.0000 mg | ORAL_TABLET | Freq: Every day | ORAL | Status: DC

## 2013-10-03 MED ORDER — ALENDRONATE SODIUM 70 MG PO TABS: 70.0000 mg | ORAL_TABLET | ORAL | Status: DC

## 2013-10-03 NOTE — Progress Notes (Signed)
Subjective:    Patient ID: Felicia Kelly, female    DOB: 07/06/1936, 76 y.o.   MRN: [id number]  HPI   77 year old caucasian female, nonsmoker presenting for a routine physical examination. Reviewed all health maintenance protocols including mammography colonoscopy bone density and reviewed appropriate screening labs. Her immunization history was reviewed as well as her current medications and allergies refills of her chronic medications were given and the plan for yearly health maintenance was discussed all orders and referrals were made as appropriate.    Review of Systems  Constitutional: Negative.   HENT: Negative.   Eyes: Negative.   Respiratory: Negative.   Cardiovascular: Negative.   Gastrointestinal: Negative.   Endocrine: Negative.   Genitourinary: Negative.   Musculoskeletal: Negative.   Skin: Negative.   Allergic/Immunologic: Negative.   Neurological: Negative.   Hematological: Negative.   Psychiatric/Behavioral: Negative.    Past Medical History  Diagnosis Date  . Arthritis   . Hypertension   . Hyperlipidemia   . Thyroid disease   . Colonic polyp     History   Social History  . Marital Status: Widowed    Spouse Name: N/A    Number of Children: N/A  . Years of Education: N/A   Occupational History  . Not on file.   Social History Main Topics  . Smoking status: Never Smoker   . Smokeless tobacco: Not on file  . Alcohol Use: No  . Drug Use: No  . Sexual Activity: Not on file   Other Topics Concern  . Not on file   Social History Narrative  . No narrative on file    Past Surgical History  Procedure Laterality Date  . Breast surgery  1985    biopsy  . Appendectomy  1988  . Abdominal hysterectomy  1988  . Total hip arthroplasty    . Foot surgery      Family History  Problem Relation Age of Onset  . Arthritis Mother   . Hyperlipidemia Mother   . Hypertension Mother   . Heart disease Mother   . Diabetes Mother   . Cancer Father    pancreatic  . Heart attack Sister 59    Allergies  Allergen Reactions  . Bactrim [Sulfamethoxazole-Trimethoprim] Itching  . Celebrex [Celecoxib]     Memory disturbance, slowed thinking  . Crestor [Rosuvastatin]   . Lipitor [Atorvastatin]     Leg cramps  . Niaspan [Niacin Er]     Elevated BP  . Tetracyclines & Related Itching  . Zocor [Simvastatin]     Leg cramps  . Penicillins Swelling    ALL CILLINS    Current Outpatient Prescriptions on File Prior to Visit  Medication Sig Dispense Refill  . ascorbic acid (VITAMIN C) 500 MG tablet Take 500 mg by mouth daily.      Marland Kitchen aspirin 81 MG tablet Take 81 mg by mouth daily.      Marland Kitchen b complex vitamins tablet Take 1 tablet by mouth daily.      . calcium gluconate 500 MG tablet Take 500 mg by mouth daily.      . Cholecalciferol (VITAMIN D) 2000 UNITS CAPS Take 2,000 Units by mouth daily.      . fish oil-omega-3 fatty acids 1000 MG capsule Take 1 g by mouth daily.      . Magnesium 250 MG TABS Take by mouth.      . Multiple Vitamin (MULTIVITAMIN) capsule Take 1 capsule by mouth daily.  No current facility-administered medications on file prior to visit.    BP 118/60  Pulse 63  Ht 5' 1.5" (1.562 m)  Wt 142 lb 8 oz (64.638 kg)  BMI 26.49 kg/m2chart    Objective:   Physical Exam  Constitutional: She is oriented to person, place, and time. She appears well-developed and well-nourished.  HENT:  Head: Normocephalic and atraumatic.  Right Ear: External ear normal.  Left Ear: External ear normal.  Nose: Nose normal.  Mouth/Throat: Oropharynx is clear and moist.  Eyes: Conjunctivae and EOM are normal. Pupils are equal, round, and reactive to light.  Neck: Normal range of motion. Neck supple.  Cardiovascular: Normal rate, regular rhythm and normal heart sounds.   Pulmonary/Chest: Effort normal and breath sounds normal.  Abdominal: Soft. Bowel sounds are normal.  Genitourinary: Vagina normal.  Musculoskeletal: Normal range of motion.   Neurological: She is alert and oriented to person, place, and time. She has normal reflexes.  Skin: Skin is warm and dry.  Psychiatric: She has a normal mood and affect.  Tetanus shot and EKG done in the office.       Assessment & Plan:  Assessment 1. Routine Annual Physical 2. Hypertension 3. Hypercholesterolemia   Plan: Refills on current medications. Encourage monthly self breast exams. Obtain labs that include CBC, BMP, lipids, LFT, TSH and UA.  Call office for any questions or concerns. Colonoscopy due 2015 Dec.

## 2013-10-03 NOTE — Progress Notes (Signed)
Pre visit review using our clinic review tool, if applicable. No additional management support is needed unless otherwise documented below in the visit note. 

## 2013-10-03 NOTE — Patient Instructions (Signed)

## 2013-11-04 ENCOUNTER — Encounter

## 2014-01-02 ENCOUNTER — Ambulatory Visit: Payer: Medicare Other

## 2014-02-02 ENCOUNTER — Ambulatory Visit: Payer: Medicare Other

## 2014-02-13 ENCOUNTER — Other Ambulatory Visit: Payer: Self-pay | Admitting: Family

## 2014-02-13 ENCOUNTER — Other Ambulatory Visit (INDEPENDENT_AMBULATORY_CARE_PROVIDER_SITE_OTHER): Payer: Medicare Other

## 2014-02-13 DIAGNOSIS — E039 Hypothyroidism, unspecified: Secondary | ICD-10-CM

## 2014-02-13 LAB — TSH: TSH: 0.01 u[IU]/mL — ABNORMAL LOW (ref 0.35–5.50)

## 2014-02-13 LAB — T4, FREE: Free T4: 0.82 ng/dL (ref 0.60–1.60)

## 2014-02-24 ENCOUNTER — Ambulatory Visit
Admission: RE | Admit: 2014-02-24 | Discharge: 2014-02-24 | Disposition: A | Discharge status: Home / Self Care | Payer: Medicare Other | Source: Ambulatory Visit | Attending: Family | Admitting: Family

## 2014-02-24 ENCOUNTER — Encounter (INDEPENDENT_AMBULATORY_CARE_PROVIDER_SITE_OTHER): Payer: Self-pay

## 2014-02-24 DIAGNOSIS — Z1231 Encounter for screening mammogram for malignant neoplasm of breast: Secondary | ICD-10-CM

## 2014-03-18 ENCOUNTER — Encounter

## 2014-03-18 DIAGNOSIS — I10 Essential (primary) hypertension: Secondary | ICD-10-CM | POA: Diagnosis not present

## 2014-03-18 DIAGNOSIS — Z8639 Personal history of other endocrine, nutritional and metabolic disease: Secondary | ICD-10-CM | POA: Diagnosis not present

## 2014-03-18 DIAGNOSIS — Z862 Personal history of diseases of the blood and blood-forming organs and certain disorders involving the immune mechanism: Secondary | ICD-10-CM | POA: Diagnosis not present

## 2014-03-18 DIAGNOSIS — R946 Abnormal results of thyroid function studies: Secondary | ICD-10-CM | POA: Diagnosis not present

## 2014-03-18 DIAGNOSIS — E042 Nontoxic multinodular goiter: Secondary | ICD-10-CM | POA: Diagnosis not present

## 2014-03-18 NOTE — Progress Notes (Signed)
History of Present Illness: Kathryn [name] is a 78 y.o. female presents for follow-up of thyroid nodules and history of hyperthyroidism.    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 She had both right nodules biopsied in 2011 with benign results.  She was treated with RAI 32.8 mci on 04/25/2013.    She had labs repeated in 06/2013 and 08/2013. Free T4 was stable both times. Recent Free T4 is now lower end of normal on 02/13/2014.    Ultrasound earlier today showed nodules and overall gland size to be smaller.    Feeling much since moving back to [place] and being near family.    BP well controlled. HR lower. Taking atenolol 12.5 mg BID (was started to address sx of hyperthyroidism initially).  Also taking valsartan/HCTZ.    No changes in her neck, specifically, she denied dysphagia, discomfort or hoarseness.     Past Medical History   Diagnosis Date   ??? Edema      left ankle   ??? DDD (degenerative disc disease) 10/1999     L 4-5 by MRI.  Dr. Shiela MayerSteven Reece.  Dr. Robie RidgeBenjamin Seeman, Pain   ??? Urinary incontinence, stress      mild.  Dr. Winnifred FriarVictor Parades   ??? Polyp of rectum      Dr. Reed Pandyamsey   ??? Cystocele      Dr. Winnifred FriarVictor Parades   ??? Postmenopausal HRT (hormone replacement therapy)      Hx  for 6 years.  Dr. Lavone NianAwad   ??? Morton's neuroma 2000     Left.  Dr. Gillis SantaJim Shadbolt   ??? Hematuria of undiagnosed cause 1980, 2009   ??? Single renal cyst 02/12/2008     Left.  0.9 cm   ??? Subchondral cysts 2009     Left hip femoral heads.  Dr. Adah PerlMatthews Dobyziak   ??? DJD (degenerative joint disease) 05/26/04     Dr. Shiela MayerSteven Reece.   ??? Hypercholesterolemia    ??? Hypertension    ??? Kidney stone 12/2002     Dr. Gillis Endsobert Brown   ??? Chickenpox childhood   ??? Rotator cuff tendinitis 12/20/04     Right.  Dr. Jen MowJohn Meyers   ??? Benign positional vertigo 01/07/04     Dr. Minette HeadlandJames Tyson   ??? SOB (shortness of breath) 07/11/04     Dr. Abigail MiyamotoBrian Kaminsky.  Neg Stress Cardiolite.  EF 60 %.   ??? Unspecified vitamin D deficiency 2008   ??? Herpes zoster 02/26/07, 06/21/07,  08/27/07     Left buttocks then Right T2-3 dermatome   ??? Herpes simplex type 2 infection 10/05/08     Left buttocks   ??? Cataract 2002     Dr. Theodoro Gristave   ??? Multinodular goiter 10/2009     Dr. Lewayne BuntingGregory Ascencion Coye.  benign.   ??? Internal carotid artery stenosis 09/18/11     Left.  16-49%.     ??? Basal cell cancer 02/2012     left lower leg.  Left foot.  Dr. Tilden FossaLara Pfeiffer.   ??? Near syncope 02/26/12     Negative Stress Test.  due to low blood sugar.  Dr. Meredith Modyhristine Browning   ??? Osteopenia 06/27/12     [facility]   ??? Hip pain [id number]     Right.  due to DJD.  Dr. Birdena CrandallMatthew Dobzyniak.   ??? Facial pain 2013     due to sinusitis.  Dr. Claria DiceJohn Robertson.     Current  Outpatient Prescriptions   Medication Sig   ??? cyanocobalamin (VITAMIN B-12) 100 mcg tablet Take 100 mcg by mouth daily.   ??? ALENDRONATE SODIUM (FOSAMAX PO) Take  by mouth every seven (7) days.   ??? Omega-3-DHA-EPA-Fish Oil 1,000 (120-180) mg cap Take  by mouth. "turtle oil"   ??? B.infantis-B.ani-B.long-B.bifi (PROBIOTIC 4X) 10-15 mg TbEC Take  by mouth.   ??? atenolol (TENORMIN) 25 mg tablet Take 1 Tab by mouth two (2) times a day. For symptoms of hyperthyroidism   ??? cholecalciferol, vitamin D3, (VITAMIN D3) 2,000 unit tab Take  by mouth.   ??? raloxifene (EVISTA) 60 mg tablet TAKE 1 TABLET EVERY DAY FOR BONES   ??? valACYclovir (VALTREX) 500 mg tablet Take 1 Tab by mouth daily. For HSV   ??? valsartan-hydrochlorothiazide (DIOVAN-HCT) 160-12.5 mg per tablet Take 1 Tab by mouth daily.   ??? pravastatin (PRAVACHOL) 80 mg tablet TAKE 1 TABLET BY MOUTH EVERY DAY   ??? mometasone (NASONEX) 50 mcg/actuation nasal spray 2 Sprays by Both Nostrils route daily.   ??? aspirin (ASPIRIN) 325 mg tablet Take 325 mg by mouth daily.     ??? calcium 500 mg Tab Take  by mouth.     ??? magnesium 250 mg Tab Take  by mouth. Unsure of exact strength    ??? ascorbic acid (VITAMIN C) 500 mg tablet Take 500 mg by mouth nightly.   ??? multivitamin (ONE A DAY) tablet Take 1 Tab by mouth daily.   ???  calcium-cholecalciferol, d3, (CALCIUM 600 + D) 600-125 mg-unit Tab Take  by mouth.     ??? b complex vitamins tablet Take 1 Tab by mouth daily.       No current facility-administered medications for this visit.     Allergies   Allergen Reactions   ??? Other Medication Rash     All cillins   ??? Penicillins Rash and Swelling   ??? Tetracycline Itching   ??? Bactrim [Sulfamethoprim Ds] Itching   ??? Celebrex [Celecoxib] Other (comments)     Memory disturbance, slowed thinking   ??? Crestor [Rosuvastatin] Other (comments)     Elevated CPK   ??? Lipitor [Atorvastatin] Other (comments)     Leg cramps   ??? Niaspan [Niacin] Other (comments)     Caused elevated BP   ??? Zocor [Simvastatin] Other (comments)     Leg cramps       Physical Examination:  Blood pressure 114/57, pulse 51, resp. rate 16, height 5\' 1"  (1.549 m), weight 154 lb 12.8 oz (70.217 kg).  - General: pleasant, no distress, normal gait   HEENT: hearing intact, EOMI, clear sclera without icterus  - Neck: no thyromegaly or LAD  - Cardiovascular: regular, low-normal rate  - Respiratory: normal effort  - Integumentary: no edema  - Psychiatric: normal mood and affect    Data Reviewed:   Labs from   06/2013 :        TSH 0.01, Free T4 1.30  09/01/2013: TSH 0.01, Free T4 1.36  02/13/2014:     TSH 0.01, Free T4 0.8 (0.6-1.6)      03/18/2014 ultrasound  COMPARISON: 09/18/2011  FINDINGS: Real-time sonography of the thyroid gland was performed with a   high frequency linear transducer. Multiple static images were obtained.  The largest complex nodule in the right upper pole measures 1.8x1x1 x 1 cm   (previously 2.3x1.1x1.3 cm). The largest largely isoechoic nodule in the   right lower thyroid has increased slightly from 3.1x1.2x2.3 cm to 3x1.8x1.8  cm.  A calcification in the left upper thyroid continues to measure 5x4x3 mm. The   left lower pole nodule has decreased from 2.9x1.1x1.2 cm to 1x0.8x0.9 cm.  The right lobe measures 6.1x2.8x2.4 cm (previously 6.6x2.9x2.2) and the left   lobe  measures 3.5x1.1x1.4 cm (previously 5.9x1.7x1.5). The isthmus measures   4 mm (previously 4 mm).  IMPRESSION:   Interval overall decrease in size of multinodular goiter.  Decrease in size of left lower thyroid nodule and right upper thyroid nodule.    Assessment/Plan:   1. Multinodular goiter   - Mrs Stilson and I reviewed current and 2012 ultrasound report and I personally reviewed images from ultrasound with Mrs Crompton present. Nodules are all stable to smaller. Largest right nodule previously with benign biopsy as well.  - overall, considering smaller size and prior hyperfunctioning status, has very low risk for malignancy.  Will follow clinically with neck exams yearly and will consider repeat ultrasound in 3-5 years dependine on exam and clinical status   2. History of hyperthyroidism   - likely treated/corrected with RAI ablation of hyperfunctioning nodules.   3. Abnormal thyroid function test   - TSH remains low. It was low for several years, so may take 6 + months of normal thryoid function to increase.  Should increase over next 3-6 months  - repeat labs in 3 and again in 6 months     4.  HTN       Continue valsartan/HCTZ. My preference would be for ACE-I/ARB + amlodipine based on results of ACCOMPLISH trial (2008 NEJM), but defer to PCP       Atenolol likely no longer needed as this was used for hyperthyroidism sx. Decrease to 12.5 mg at bedtime. Reassess in 2 weeks, stop.      Patient Instructions     Thyroid nodules and history of hyperthyroidism:  - Both nodules on right side had benign biopsies in 2011.    - All nodules are either stable in size or smaller after radioactive iodine in 04/2013 vs 2012 ultrasound    - The thyroid hormone levels have decreased significantly and are now in the lower end of the normal range    -TSH has been low for several years. It should start coming up/increasing over the next 6 months      I recommend repeating TSH and Free T4 in 3 months and again in 6 months to confirm  thyroid function remains normal    Blood pressure and heart rate  - I do not think you need atenolol. You were taking this to help with symptoms of hyperthyroidism. Your thyroid hormone levels are now normal.  - Decrease to 1/2 tablet at bedtime only  - If heart rate and blood pressure are fine, would stop altogether in 2 weeks              Follow-up Disposition:  Return in about 1 year (around 03/19/2015).    Copy sent to:  Adline Mango

## 2014-03-18 NOTE — Patient Instructions (Signed)
Thyroid nodules and history of hyperthyroidism:  - Both nodules on right side had benign biopsies in 2011.    - All nodules are either stable in size or smaller after radioactive iodine in 04/2013 vs 2012 ultrasound    - The thyroid hormone levels have decreased significantly and are now in the lower end of the normal range    -TSH has been low for several years. It should start coming up/increasing over the next 6 months      I recommend repeating TSH and Free T4 in 3 months and again in 6 months to confirm thyroid function remains normal    Blood pressure and heart rate  - I do not think you need atenolol. You were taking this to help with symptoms of hyperthyroidism. Your thyroid hormone levels are now normal.  - Decrease to 1/2 tablet at bedtime only  - If heart rate and blood pressure are fine, would stop altogether in 2 weeks

## 2014-03-23 NOTE — Telephone Encounter (Signed)
[facility] pharmacy faxed a refill request.

## 2014-03-24 MED ORDER — ATENOLOL 25 MG TAB
25 mg | ORAL_TABLET | Freq: Every day | ORAL | Status: AC
Start: 2014-03-24 — End: ?

## 2014-03-24 NOTE — Telephone Encounter (Signed)
Refill request for Atenolol 25mg  was sent to provider

## 2014-03-24 NOTE — Telephone Encounter (Signed)
Pt was made aware that her refill request for atenolol was sent to [facility]

## 2014-04-03 ENCOUNTER — Ambulatory Visit (INDEPENDENT_AMBULATORY_CARE_PROVIDER_SITE_OTHER): Payer: Medicare Other | Admitting: Family

## 2014-04-03 ENCOUNTER — Encounter: Payer: Self-pay | Admitting: Family

## 2014-04-03 VITALS — BP 120/74 | HR 70 | Temp 97.7°F | Ht 61.5 in | Wt 154.6 lb

## 2014-04-03 DIAGNOSIS — R350 Frequency of micturition: Secondary | ICD-10-CM | POA: Diagnosis not present

## 2014-04-03 DIAGNOSIS — E039 Hypothyroidism, unspecified: Secondary | ICD-10-CM | POA: Diagnosis not present

## 2014-04-03 DIAGNOSIS — E7800 Pure hypercholesterolemia, unspecified: Secondary | ICD-10-CM

## 2014-04-03 DIAGNOSIS — I10 Essential (primary) hypertension: Secondary | ICD-10-CM

## 2014-04-03 LAB — AMB EXT CREATININE: Creatinine, External: 0.7

## 2014-04-03 LAB — CBC WITH DIFFERENTIAL/PLATELET
Basophils Absolute: 0 10*3/uL (ref 0.0–0.1)
Basophils Relative: 0.6 % (ref 0.0–3.0)
Eosinophils Absolute: 0.3 10*3/uL (ref 0.0–0.7)
Eosinophils Relative: 3.8 % (ref 0.0–5.0)
HCT: 38.5 % (ref 36.0–46.0)
Hemoglobin: 12.8 g/dL (ref 12.0–15.0)
Lymphocytes Relative: 31.6 % (ref 12.0–46.0)
Lymphs Abs: 2.3 10*3/uL (ref 0.7–4.0)
MCHC: 33.2 g/dL (ref 30.0–36.0)
MCV: 88.4 fl (ref 78.0–100.0)
Monocytes Absolute: 0.5 10*3/uL (ref 0.1–1.0)
Monocytes Relative: 7.4 % (ref 3.0–12.0)
Neutro Abs: 4 10*3/uL (ref 1.4–7.7)
Neutrophils Relative %: 56.6 % (ref 43.0–77.0)
Platelets: 283 10*3/uL (ref 150.0–400.0)
RBC: 4.36 Mil/uL (ref 3.87–5.11)
RDW: 14.2 % (ref 11.5–15.5)
WBC: 7.1 10*3/uL (ref 4.0–10.5)

## 2014-04-03 LAB — TSH: TSH: 0.24 u[IU]/mL — ABNORMAL LOW (ref 0.35–4.50)

## 2014-04-03 LAB — BASIC METABOLIC PANEL
BUN: 14 mg/dL (ref 6–23)
CO2: 27 mEq/L (ref 19–32)
Calcium: 9.3 mg/dL (ref 8.4–10.5)
Chloride: 105 mEq/L (ref 96–112)
Creatinine, Ser: 0.7 mg/dL (ref 0.4–1.2)
GFR: 82.34 mL/min (ref >60.00)
Glucose, Bld: 93 mg/dL (ref 70–99)
Potassium: 3.9 mEq/L (ref 3.5–5.1)
Sodium: 140 mEq/L (ref 135–145)

## 2014-04-03 LAB — HEPATIC FUNCTION PANEL
ALT: 31 U/L (ref 0–35)
AST: 33 U/L (ref 0–37)
Albumin: 3.9 g/dL (ref 3.5–5.2)
Alkaline Phosphatase: 66 U/L (ref 39–117)
Bilirubin, Direct: 0 mg/dL (ref 0.0–0.3)
Total Bilirubin: 0.4 mg/dL (ref 0.2–1.2)
Total Protein: 6.7 g/dL (ref 6.0–8.3)

## 2014-04-03 LAB — POCT URINALYSIS DIPSTICK
Bilirubin, UA: NEGATIVE
Glucose, UA: NEGATIVE
Ketones, UA: NEGATIVE
Leukocytes, UA: NEGATIVE
Nitrite, UA: NEGATIVE
Spec Grav, UA: 1.02
Urobilinogen, UA: 0.2
pH, UA: 7.5

## 2014-04-03 MED ORDER — VALACYCLOVIR HCL 500 MG PO TABS: 500.0000 mg | ORAL_TABLET | Freq: Every day | ORAL | Status: DC

## 2014-04-03 MED ORDER — ALENDRONATE SODIUM 70 MG PO TABS: 70.0000 mg | ORAL_TABLET | ORAL | Status: DC

## 2014-04-03 MED ORDER — VALSARTAN-HYDROCHLOROTHIAZIDE 160-12.5 MG PO TABS: 0.5000 | ORAL_TABLET | Freq: Every day | ORAL | Status: DC

## 2014-04-03 MED ORDER — PRAVASTATIN SODIUM 80 MG PO TABS: 80.0000 mg | ORAL_TABLET | Freq: Every day | ORAL | Status: DC

## 2014-04-03 MED ORDER — ATENOLOL 25 MG PO TABS: 25.0000 mg | ORAL_TABLET | Freq: Every day | ORAL | Status: DC

## 2014-04-03 NOTE — Patient Instructions (Signed)
Exercise to Lose Weight Exercise and a healthy diet may help you lose weight. Your doctor may suggest specific exercises. EXERCISE IDEAS AND TIPS  Choose low-cost things you enjoy doing, such as walking, bicycling, or exercising to workout videos.  Take stairs instead of the elevator.  Walk during your lunch break.  Park your car further away from work or school.  Go to a gym or an exercise class.  Start with 5 to 10 minutes of exercise each day. Build up to 30 minutes of exercise 4 to 6 days a week.  Wear shoes with good support and comfortable clothes.  Stretch before and after working out.  Work out until you breathe harder and your heart beats faster.  Drink extra water when you exercise.  Do not do so much that you hurt yourself, feel dizzy, or get very short of breath. Exercises that burn about 150 calories:  Running 1  miles in 15 minutes.  Playing volleyball for 45 to 60 minutes.  Washing and waxing a car for 45 to 60 minutes.  Playing touch football for 45 minutes.  Walking 1  miles in 35 minutes.  Pushing a stroller 1  miles in 30 minutes.  Playing basketball for 30 minutes.  Raking leaves for 30 minutes.  Bicycling 5 miles in 30 minutes.  Walking 2 miles in 30 minutes.  Dancing for 30 minutes.  Shoveling snow for 15 minutes.  Swimming laps for 20 minutes.  Walking up stairs for 15 minutes.  Bicycling 4 miles in 15 minutes.  Gardening for 30 to 45 minutes.  Jumping rope for 15 minutes.  Washing windows or floors for 45 to 60 minutes. Document Released: 11/04/2010 Document Revised: 12/25/2011 Document Reviewed: 11/04/2010 Exit[employer] Patient Information 2015 [employer]. This information is not intended to replace advice given to you by your health care provider. Make sure you discuss any questions you have with your health care provider.  

## 2014-04-03 NOTE — Progress Notes (Signed)
Subjective:    Patient ID: Felicia Kelly, female    DOB: [birth year], 78 y.o.   MRN: [id number]  HPI Comments: 78 year old white female, nonsmoker is in today for recheck of hypertension, hyperlipidemia, and hypothyroidism. Reports doing well. Has noticed an increase in her weight over the last 6 months of approximately 12 pounds. However, she's not exercising. Recently joined a gym and plans to start. She is tolerating her medications well.  Patient has concerns of urinary frequency and urgency that's been ongoing for several months. Denies any burning with urination, no blood. Reports left upper back pain that comes and goes. No history of renal calculi.     Review of Systems  Constitutional: Positive for unexpected weight change. Negative for fever and diaphoresis.  HENT: Negative.   Respiratory: Negative.   Cardiovascular: Negative.   Gastrointestinal: Negative.   Endocrine: Negative.   Musculoskeletal: Negative.   Skin: Negative.   Allergic/Immunologic: Negative.   Neurological: Negative.   Hematological: Negative.   Psychiatric/Behavioral: Negative.    Past Medical History  Diagnosis Date  . Arthritis   . Hypertension   . Hyperlipidemia   . Thyroid disease   . Colonic polyp     History   Social History  . Marital Status: Widowed    Spouse Name: N/A    Number of Children: N/A  . Years of Education: N/A   Occupational History  . Not on file.   Social History Main Topics  . Smoking status: Never Smoker   . Smokeless tobacco: Not on file  . Alcohol Use: No  . Drug Use: No  . Sexual Activity: Not on file   Other Topics Concern  . Not on file   Social History Narrative  . No narrative on file    Past Surgical History  Procedure Laterality Date  . Breast surgery  1985    biopsy  . Appendectomy  1988  . Abdominal hysterectomy  1988  . Total hip arthroplasty    . Foot surgery      Family History  Problem Relation Age of Onset  . Arthritis Mother     . Hyperlipidemia Mother   . Hypertension Mother   . Heart disease Mother   . Diabetes Mother   . Cancer Father     pancreatic  . Heart attack Sister 42    Allergies  Allergen Reactions  . Bactrim [Sulfamethoxazole-Trimethoprim] Itching  . Celebrex [Celecoxib]     Memory disturbance, slowed thinking  . Crestor [Rosuvastatin]   . Lipitor [Atorvastatin]     Leg cramps  . Niaspan [Niacin Er]     Elevated BP  . Tetracyclines & Related Itching  . Zocor [Simvastatin]     Leg cramps  . Penicillins Swelling    ALL CILLINS    Current Outpatient Prescriptions on File Prior to Visit  Medication Sig Dispense Refill  . ascorbic acid (VITAMIN C) 500 MG tablet Take 500 mg by mouth daily.      Marland Kitchen aspirin 81 MG tablet Take 81 mg by mouth daily.      Marland Kitchen b complex vitamins tablet Take 1 tablet by mouth daily.      . calcium gluconate 500 MG tablet Take 500 mg by mouth daily.      . chlorhexidine (PERIDEX) 0.12 % solution       . Cholecalciferol (VITAMIN D) 2000 UNITS CAPS Take 2,000 Units by mouth daily.      . fish oil-omega-3 fatty acids 1000 MG capsule Take  1 g by mouth daily.      . Magnesium 250 MG TABS Take by mouth.      . Multiple Vitamin (MULTIVITAMIN) capsule Take 1 capsule by mouth daily.       No current facility-administered medications on file prior to visit.    BP 120/74  Pulse 70  Temp(Src) 97.7 F (36.5 C) (Oral)  Ht 5' 1.5" (1.562 m)  Wt 154 lb 9.6 oz (70.126 kg)  BMI 28.74 kg/m2chart    Objective:   Physical Exam  Constitutional: She is oriented to person, place, and time. She appears well-developed and well-nourished.  Neck: Normal range of motion. Neck supple. No thyromegaly present.  Cardiovascular: Normal rate, regular rhythm and normal heart sounds.   Pulmonary/Chest: Effort normal and breath sounds normal.  Abdominal: Soft. Bowel sounds are normal.  Musculoskeletal: Normal range of motion.  Neurological: She is alert and oriented to person, place, and  time.  Skin: Skin is warm and dry.  Psychiatric: She has a normal mood and affect.          Assessment & Plan:   Problem List Items Addressed This Visit   Pure hypercholesterolemia   Relevant Medications      valsartan-hydrochlorothiazide (DIOVAN-HCT) 160-12.5 MG per tablet      atenolol (TENORMIN) tablet      pravastatin (PRAVACHOL) 80 MG tablet   Other Relevant Orders      Basic Metabolic Panel      Hepatic Function Panel   Unspecified essential hypertension   Relevant Medications      valsartan-hydrochlorothiazide (DIOVAN-HCT) 160-12.5 MG per tablet      atenolol (TENORMIN) tablet      pravastatin (PRAVACHOL) 80 MG tablet   Other Relevant Orders      Basic Metabolic Panel      Hepatic Function Panel   Unspecified hypothyroidism   Relevant Medications      atenolol (TENORMIN) tablet   Other Relevant Orders      TSH    Other Visit Diagnoses   Urinary frequency    -  Primary    Relevant Orders       POCT Urine Dip (Completed)       Culture, Urine       CBC with Differential      Await culture report. Call the office with any questions or concerns. Recheck in 6 months, pending labs and sooner as needed.

## 2014-04-03 NOTE — Progress Notes (Signed)
Pre visit review using our clinic review tool, if applicable. No additional management support is needed unless otherwise documented below in the visit note. 

## 2014-04-04 ENCOUNTER — Telehealth: Payer: Self-pay | Admitting: Family

## 2014-04-04 NOTE — Telephone Encounter (Signed)
Relevant patient education mailed to patient.  

## 2014-04-05 LAB — URINE CULTURE
Colony Count: NO GROWTH
Organism ID, Bacteria: NO GROWTH

## 2014-04-06 ENCOUNTER — Telehealth: Payer: Self-pay

## 2014-04-06 NOTE — Telephone Encounter (Signed)
Lab results faxed to Dr. Lacinda Axon at [place]. Fax # [phone]

## 2014-04-07 ENCOUNTER — Telehealth: Payer: Self-pay | Admitting: Family

## 2014-04-07 NOTE — Telephone Encounter (Signed)
Pt was in Friday  04/03/56 and receive her lab results TODAY  but would like to know what she should do about the pain in her kidney and is requesting KUB

## 2014-04-07 NOTE — Telephone Encounter (Signed)
Please advise 

## 2014-04-07 NOTE — Telephone Encounter (Signed)
Go to [place] office for xray.

## 2014-04-07 NOTE — Telephone Encounter (Signed)
Pt aware and verbalized understanding.  

## 2014-04-08 ENCOUNTER — Other Ambulatory Visit: Payer: Self-pay

## 2014-04-08 ENCOUNTER — Ambulatory Visit (INDEPENDENT_AMBULATORY_CARE_PROVIDER_SITE_OTHER)
Admission: RE | Admit: 2014-04-08 | Discharge: 2014-04-08 | Disposition: A | Discharge status: Home / Self Care | Payer: Medicare Other | Source: Ambulatory Visit | Attending: Family | Admitting: Family

## 2014-04-08 DIAGNOSIS — M5489 Other dorsalgia: Secondary | ICD-10-CM

## 2014-04-08 DIAGNOSIS — M549 Dorsalgia, unspecified: Secondary | ICD-10-CM

## 2014-04-08 DIAGNOSIS — M546 Pain in thoracic spine: Secondary | ICD-10-CM

## 2014-04-08 DIAGNOSIS — R109 Unspecified abdominal pain: Secondary | ICD-10-CM | POA: Diagnosis not present

## 2014-04-28 ENCOUNTER — Other Ambulatory Visit: Payer: Self-pay | Admitting: Family

## 2014-05-22 DIAGNOSIS — L723 Sebaceous cyst: Secondary | ICD-10-CM | POA: Diagnosis not present

## 2014-05-22 DIAGNOSIS — L988 Other specified disorders of the skin and subcutaneous tissue: Secondary | ICD-10-CM | POA: Diagnosis not present

## 2014-05-22 DIAGNOSIS — D235 Other benign neoplasm of skin of trunk: Secondary | ICD-10-CM | POA: Diagnosis not present

## 2014-05-22 DIAGNOSIS — D485 Neoplasm of uncertain behavior of skin: Secondary | ICD-10-CM | POA: Diagnosis not present

## 2014-06-05 DIAGNOSIS — D485 Neoplasm of uncertain behavior of skin: Secondary | ICD-10-CM | POA: Diagnosis not present

## 2014-07-22 DIAGNOSIS — H6123 Impacted cerumen, bilateral: Secondary | ICD-10-CM | POA: Diagnosis not present

## 2014-07-22 DIAGNOSIS — H8112 Benign paroxysmal vertigo, left ear: Secondary | ICD-10-CM | POA: Diagnosis not present

## 2014-07-22 DIAGNOSIS — H900 Conductive hearing loss, bilateral: Secondary | ICD-10-CM | POA: Diagnosis not present

## 2014-07-27 DIAGNOSIS — Z23 Encounter for immunization: Secondary | ICD-10-CM | POA: Diagnosis not present

## 2014-09-12 ENCOUNTER — Other Ambulatory Visit: Payer: Self-pay | Admitting: Family

## 2014-09-23 NOTE — Telephone Encounter (Signed)
error 

## 2014-09-24 ENCOUNTER — Other Ambulatory Visit: Payer: Self-pay | Admitting: Family

## 2014-10-06 ENCOUNTER — Ambulatory Visit (INDEPENDENT_AMBULATORY_CARE_PROVIDER_SITE_OTHER): Payer: Medicare Other | Admitting: Family

## 2014-10-06 ENCOUNTER — Encounter: Payer: Self-pay | Admitting: Family

## 2014-10-06 VITALS — BP 108/72 | HR 62 | Ht 61.5 in | Wt 163.9 lb

## 2014-10-06 DIAGNOSIS — Z23 Encounter for immunization: Secondary | ICD-10-CM

## 2014-10-06 DIAGNOSIS — Z0000 Encounter for general adult medical examination without abnormal findings: Secondary | ICD-10-CM

## 2014-10-06 DIAGNOSIS — E7800 Pure hypercholesterolemia, unspecified: Secondary | ICD-10-CM

## 2014-10-06 DIAGNOSIS — E039 Hypothyroidism, unspecified: Secondary | ICD-10-CM

## 2014-10-06 DIAGNOSIS — M810 Age-related osteoporosis without current pathological fracture: Secondary | ICD-10-CM

## 2014-10-06 DIAGNOSIS — I10 Essential (primary) hypertension: Secondary | ICD-10-CM | POA: Diagnosis not present

## 2014-10-06 LAB — LIPID PANEL
Cholesterol: 199 mg/dL (ref 0–200)
HDL: 39.8 mg/dL (ref >39.00)
LDL Cholesterol: 123 mg/dL — ABNORMAL HIGH (ref 0–99)
NonHDL: 159.2
Total CHOL/HDL Ratio: 5
Triglycerides: 179 mg/dL — ABNORMAL HIGH (ref 0.0–149.0)
VLDL: 35.8 mg/dL (ref 0.0–40.0)

## 2014-10-06 LAB — HEPATIC FUNCTION PANEL
ALT: 24 U/L (ref 0–35)
AST: 27 U/L (ref 0–37)
Albumin: 4.1 g/dL (ref 3.5–5.2)
Alkaline Phosphatase: 58 U/L (ref 39–117)
Bilirubin, Direct: 0 mg/dL (ref 0.0–0.3)
Total Bilirubin: 0.5 mg/dL (ref 0.2–1.2)
Total Protein: 7 g/dL (ref 6.0–8.3)

## 2014-10-06 LAB — CBC WITH DIFFERENTIAL/PLATELET
Basophils Absolute: 0 10*3/uL (ref 0.0–0.1)
Basophils Relative: 0.7 % (ref 0.0–3.0)
Eosinophils Absolute: 0.3 10*3/uL (ref 0.0–0.7)
Eosinophils Relative: 4.5 % (ref 0.0–5.0)
HCT: 39.3 % (ref 36.0–46.0)
Hemoglobin: 12.8 g/dL (ref 12.0–15.0)
Lymphocytes Relative: 35 % (ref 12.0–46.0)
Lymphs Abs: 2 10*3/uL (ref 0.7–4.0)
MCHC: 32.6 g/dL (ref 30.0–36.0)
MCV: 91.2 fl (ref 78.0–100.0)
Monocytes Absolute: 0.4 10*3/uL (ref 0.1–1.0)
Monocytes Relative: 7 % (ref 3.0–12.0)
Neutro Abs: 3 10*3/uL (ref 1.4–7.7)
Neutrophils Relative %: 52.8 % (ref 43.0–77.0)
Platelets: 257 10*3/uL (ref 150.0–400.0)
RBC: 4.31 Mil/uL (ref 3.87–5.11)
RDW: 13.8 % (ref 11.5–15.5)
WBC: 5.8 10*3/uL (ref 4.0–10.5)

## 2014-10-06 LAB — BASIC METABOLIC PANEL
BUN: 22 mg/dL (ref 6–23)
CO2: 27 mEq/L (ref 19–32)
Calcium: 9.4 mg/dL (ref 8.4–10.5)
Chloride: 107 mEq/L (ref 96–112)
Creatinine, Ser: 0.9 mg/dL (ref 0.4–1.2)
GFR: 62.19 mL/min (ref >60.00)
Glucose, Bld: 102 mg/dL — ABNORMAL HIGH (ref 70–99)
Potassium: 4.3 mEq/L (ref 3.5–5.1)
Sodium: 143 mEq/L (ref 135–145)

## 2014-10-06 LAB — POCT URINALYSIS DIPSTICK
Bilirubin, UA: NEGATIVE
Glucose, UA: NEGATIVE
Ketones, UA: NEGATIVE
Nitrite, UA: NEGATIVE
Protein, UA: NEGATIVE
Spec Grav, UA: 1.02
Urobilinogen, UA: 0.2
pH, UA: 6.5

## 2014-10-06 LAB — TSH: TSH: 4.36 u[IU]/mL (ref 0.35–4.50)

## 2014-10-06 NOTE — Telephone Encounter (Signed)
KUB was not done and no f/u was scheduled. Pt will be seen in the office today

## 2014-10-06 NOTE — Patient Instructions (Signed)
Exercise to Stay Healthy Exercise helps you become and stay healthy. EXERCISE IDEAS AND TIPS Choose exercises that:  You enjoy.  Fit into your day. You do not need to exercise really hard to be healthy. You can do exercises at a slow or medium level and stay healthy. You can:  Stretch before and after working out.  Try yoga, Pilates, or tai chi.  Lift weights.  Walk fast, swim, jog, run, climb stairs, bicycle, dance, or rollerskate.  Take aerobic classes. Exercises that burn about 150 calories:  Running 1  miles in 15 minutes.  Playing volleyball for 45 to 60 minutes.  Washing and waxing a car for 45 to 60 minutes.  Playing touch football for 45 minutes.  Walking 1  miles in 35 minutes.  Pushing a stroller 1  miles in 30 minutes.  Playing basketball for 30 minutes.  Raking leaves for 30 minutes.  Bicycling 5 miles in 30 minutes.  Walking 2 miles in 30 minutes.  Dancing for 30 minutes.  Shoveling snow for 15 minutes.  Swimming laps for 20 minutes.  Walking up stairs for 15 minutes.  Bicycling 4 miles in 15 minutes.  Gardening for 30 to 45 minutes.  Jumping rope for 15 minutes.  Washing windows or floors for 45 to 60 minutes. Document Released: 11/04/2010 Document Revised: 12/25/2011 Document Reviewed: 11/04/2010 Exit[employer] Patient Information 2015 [employer]. This information is not intended to replace advice given to you by your health care provider. Make sure you discuss any questions you have with your health care provider.  

## 2014-10-06 NOTE — Telephone Encounter (Signed)
Felicia Kelly can you close this please.Marland Kitchen

## 2014-10-06 NOTE — Progress Notes (Signed)
Pre visit review using our clinic review tool, if applicable. No additional management support is needed unless otherwise documented below in the visit note. 

## 2014-10-06 NOTE — Progress Notes (Signed)
Subjective:    Patient ID: Felicia Kelly, female    DOB: 1936-03-20, 78 y.o.   MRN: [id number]  HPI 78 year old white female, nonsmoker with a history of hypothyroidism, osteoporosis, hypertension, hypercholesterolemia, and osteopenia is in today for complete physical exam.  This is a routine wellness  examination for this patient . I reviewed all health maintenance protocols including mammography, colonoscopy, bone density Needed referrals were placed. Age and diagnosis  appropriate screening labs were ordered. Her immunization history was reviewed and appropriate vaccinations were ordered. Her current medications and allergies were reviewed and needed refills of her chronic medications were ordered. The plan for yearly health maintenance was discussed all orders and referrals were made as appropriate.  Review of Systems  Constitutional: Negative.   HENT: Negative.   Eyes: Negative.   Respiratory: Negative.   Cardiovascular: Negative.   Gastrointestinal: Negative.   Endocrine: Negative.   Genitourinary: Negative.   Musculoskeletal: Negative.   Skin: Negative.   Allergic/Immunologic: Negative.   Neurological: Negative.   Hematological: Negative.   Psychiatric/Behavioral: Negative.    Past Medical History  Diagnosis Date  . Arthritis   . Hypertension   . Hyperlipidemia   . Thyroid disease   . Colonic polyp     History   Social History  . Marital Status: Widowed    Spouse Name: N/A    Number of Children: N/A  . Years of Education: N/A   Occupational History  . Not on file.   Social History Main Topics  . Smoking status: Never Smoker   . Smokeless tobacco: Not on file  . Alcohol Use: No  . Drug Use: No  . Sexual Activity: Not on file   Other Topics Concern  . Not on file   Social History Narrative  . No narrative on file    Past Surgical History  Procedure Laterality Date  . Breast surgery  1985    biopsy  . Appendectomy  1988  . Abdominal hysterectomy   1988  . Total hip arthroplasty    . Foot surgery      Family History  Problem Relation Age of Onset  . Arthritis Mother   . Hyperlipidemia Mother   . Hypertension Mother   . Heart disease Mother   . Diabetes Mother   . Cancer Father     pancreatic  . Heart attack Sister 78    Allergies  Allergen Reactions  . Bactrim [Sulfamethoxazole-Trimethoprim] Itching  . Celebrex [Celecoxib]     Memory disturbance, slowed thinking  . Crestor [Rosuvastatin]   . Lipitor [Atorvastatin]     Leg cramps  . Niaspan [Niacin Er]     Elevated BP  . Tetracyclines & Related Itching  . Zocor [Simvastatin]     Leg cramps  . Penicillins Swelling    ALL CILLINS    Current Outpatient Prescriptions on File Prior to Visit  Medication Sig Dispense Refill  . alendronate (FOSAMAX) 70 MG tablet TAKE 1 TABLET BY MOUTH EVERY 7 DAYS. TAKE WITH A FULL GLASS OF WATER ON AN EMPTY STOMACH 12 tablet 1  . ascorbic acid (VITAMIN C) 500 MG tablet Take 500 mg by mouth daily.    Marland Kitchen aspirin 81 MG tablet Take 81 mg by mouth daily.    Marland Kitchen atenolol (TENORMIN) 25 MG tablet Take 1 tablet (25 mg total) by mouth daily. 180 tablet 1  . b complex vitamins tablet Take 1 tablet by mouth daily.    . calcium gluconate 500 MG tablet Take  500 mg by mouth daily.    . chlorhexidine (PERIDEX) 0.12 % solution     . Cholecalciferol (VITAMIN D) 2000 UNITS CAPS Take 2,000 Units by mouth daily.    . fish oil-omega-3 fatty acids 1000 MG capsule Take 1 g by mouth daily.    . Magnesium 250 MG TABS Take by mouth.    . Multiple Vitamin (MULTIVITAMIN) capsule Take 1 capsule by mouth daily.    . pravastatin (PRAVACHOL) 80 MG tablet TAKE 1 TABLET BY MOUTH DAILY 30 tablet 5  . valACYclovir (VALTREX) 500 MG tablet Take 1 tablet (500 mg total) by mouth daily. 90 tablet 1  . valsartan-hydrochlorothiazide (DIOVAN-HCT) 160-12.5 MG per tablet TAKE 0.5 TABLETS BY MOUTH DAILY. 45 tablet 1   No current facility-administered medications on file prior to  visit.    There were no vitals taken for this visit.chart     Objective:   Physical Exam  Constitutional: She is oriented to person, place, and time. She appears well-developed and well-nourished.  HENT:  Head: Normocephalic.  Right Ear: External ear normal.  Left Ear: External ear normal.  Nose: Nose normal.  Mouth/Throat: Oropharynx is clear and moist.  Eyes: Conjunctivae and EOM are normal. Pupils are equal, round, and reactive to light.  Neck: Normal range of motion. Neck supple. No thyromegaly present.  Cardiovascular: Normal rate, regular rhythm and normal heart sounds.   Pulmonary/Chest: Effort normal and breath sounds normal.  Abdominal: Soft. Bowel sounds are normal.  Musculoskeletal: Normal range of motion. She exhibits no edema or tenderness.  Neurological: She is alert and oriented to person, place, and time. She has normal reflexes. She displays normal reflexes. No cranial nerve deficit. Coordination normal.  Skin: Skin is warm and dry.  Psychiatric: She has a normal mood and affect.          Assessment & Plan:  Rickeya was seen today for no specified reason.  Diagnoses and associated orders for this visit:  Medicare annual wellness visit, subsequent  Essential hypertension - POC Urinalysis Dipstick - Basic Metabolic Panel - Hepatic Function Panel - CBC with Differential  Pure hypercholesterolemia - Lipid Panel - Basic Metabolic Panel - Hepatic Function Panel - CBC with Differential  Osteoporosis - CBC with Differential  Hypothyroidism, unspecified hypothyroidism type - T3 - T4 - TSH - CBC with Differential  Need for prophylactic vaccination against Streptococcus pneumoniae (pneumococcus) - Pneumococcal conjugate vaccine 13-valent    Encouraged a healthy diet, exercise, and monthly self breast exams. Recheck in 6 months. Continue current medications.

## 2014-10-07 ENCOUNTER — Other Ambulatory Visit: Payer: Self-pay | Admitting: Family

## 2014-10-07 LAB — T4: T4, Total: 5.6 ug/dL (ref 4.5–12.0)

## 2014-10-07 LAB — T3: T3, Total: 84.3 ng/dL (ref 80.0–204.0)

## 2014-11-02 ENCOUNTER — Other Ambulatory Visit: Payer: Self-pay | Admitting: Family

## 2014-11-27 ENCOUNTER — Telehealth: Payer: Self-pay | Admitting: Family

## 2014-11-27 MED ORDER — ALENDRONATE SODIUM 70 MG PO TABS: ORAL_TABLET | ORAL | Status: DC

## 2014-11-27 MED ORDER — PRAVASTATIN SODIUM 80 MG PO TABS: 80.0000 mg | ORAL_TABLET | Freq: Every day | ORAL | Status: DC

## 2014-11-27 MED ORDER — VALACYCLOVIR HCL 500 MG PO TABS: ORAL_TABLET | ORAL | Status: DC

## 2014-11-27 MED ORDER — ATENOLOL 25 MG PO TABS: 12.5000 mg | ORAL_TABLET | Freq: Every day | ORAL | Status: DC

## 2014-11-27 MED ORDER — VALSARTAN-HYDROCHLOROTHIAZIDE 160-12.5 MG PO TABS: ORAL_TABLET | ORAL | Status: DC

## 2014-11-27 NOTE — Telephone Encounter (Signed)
Patient states [facility] told her she didn't have any re-fills on her pravastatin (PRAVACHOL) 80 MG tablet.  Can you please call them or re-send the rx since they didn't get the re-fill?

## 2014-11-27 NOTE — Telephone Encounter (Signed)
All Rxs sent in for 90day supplies with 3 refills

## 2014-12-01 ENCOUNTER — Other Ambulatory Visit: Payer: Self-pay

## 2014-12-01 DIAGNOSIS — Z1231 Encounter for screening mammogram for malignant neoplasm of breast: Secondary | ICD-10-CM

## 2014-12-07 DIAGNOSIS — R42 Dizziness and giddiness: Secondary | ICD-10-CM | POA: Diagnosis not present

## 2014-12-07 DIAGNOSIS — H8313 Labyrinthine fistula, bilateral: Secondary | ICD-10-CM | POA: Diagnosis not present

## 2014-12-16 DIAGNOSIS — L821 Other seborrheic keratosis: Secondary | ICD-10-CM | POA: Diagnosis not present

## 2014-12-16 DIAGNOSIS — D225 Melanocytic nevi of trunk: Secondary | ICD-10-CM | POA: Diagnosis not present

## 2015-01-12 ENCOUNTER — Encounter: Payer: Self-pay | Admitting: Gastroenterology

## 2015-01-12 DIAGNOSIS — H8112 Benign paroxysmal vertigo, left ear: Secondary | ICD-10-CM | POA: Diagnosis not present

## 2015-01-12 DIAGNOSIS — R42 Dizziness and giddiness: Secondary | ICD-10-CM | POA: Diagnosis not present

## 2015-02-09 DIAGNOSIS — R42 Dizziness and giddiness: Secondary | ICD-10-CM | POA: Diagnosis not present

## 2015-02-09 DIAGNOSIS — Z8639 Personal history of other endocrine, nutritional and metabolic disease: Secondary | ICD-10-CM | POA: Diagnosis not present

## 2015-02-09 DIAGNOSIS — H8112 Benign paroxysmal vertigo, left ear: Secondary | ICD-10-CM | POA: Diagnosis not present

## 2015-02-09 DIAGNOSIS — E042 Nontoxic multinodular goiter: Secondary | ICD-10-CM | POA: Diagnosis not present

## 2015-02-10 LAB — T4, FREE: T4, Free: 0.81 ng/dL — ABNORMAL LOW (ref 0.82–1.77)

## 2015-02-10 LAB — TSH 3RD GENERATION: TSH: 4.67 u[IU]/mL — ABNORMAL HIGH (ref 0.450–4.500)

## 2015-02-10 MED ORDER — LEVOTHYROXINE 50 MCG TAB
50 mcg | ORAL_TABLET | Freq: Every day | ORAL | Status: DC
Start: 2015-02-10 — End: 2015-12-26

## 2015-02-10 NOTE — Progress Notes (Signed)
Quick Note:        Labs consistent with hypothyroidism. 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 Start 50 mcg levothyroxine and reassess at follow-up visit    Carlena SaxBlair    Please call and mail lab letter.    Thank you.        ______

## 2015-02-12 NOTE — Progress Notes (Signed)
Quick Note:        I reached patient and read the lab letter and she voiced understanding, letter mailed.    ______

## 2015-02-16 ENCOUNTER — Telehealth: Payer: Self-pay | Admitting: Family

## 2015-02-16 NOTE — Telephone Encounter (Signed)
Noted! Thank you

## 2015-02-16 NOTE — Telephone Encounter (Signed)
Need med check in 2 months per padonda's recs. Ok to refill for 2 months only and schedule med check in 2 months.

## 2015-02-16 NOTE — Telephone Encounter (Signed)
FYI only: Pt called b/c she had run out of her valsartan rx. Pt had been taking 1 tab and did not realize the RX was for .05. Pt states she had been going through her husbands death and had a hard time. But pretty much over that. Pt discussed w/ the pharmacist and will follow padonda's instructions and take .05.  Pt will monitor her bp. Pt is going out of town and cannot come in right now. But advised pt  will do a med check appt if she feels as though she needs to increase to 1 tab if her bp does not stay under control. Pt does not have an est appt until Jan 2017 w/ dr Maudie Mercury.

## 2015-02-16 NOTE — Telephone Encounter (Signed)
I called the pt and informed her she can see Dr Maudie Mercury for a visit due to her medications only prior to her visit in 2017.  She stated she will check her calendar and call back for an appt and states she does not need a refill at this time as the pharmacist gave her a 2 week supply.

## 2015-03-17 ENCOUNTER — Encounter: Attending: Internal Medicine | Primary: Family Medicine

## 2015-03-22 ENCOUNTER — Ambulatory Visit: Payer: Medicare Other

## 2015-03-23 ENCOUNTER — Ambulatory Visit: Payer: Medicare Other

## 2015-03-29 ENCOUNTER — Other Ambulatory Visit: Payer: Self-pay | Admitting: Family

## 2015-03-30 ENCOUNTER — Ambulatory Visit: Payer: Medicare Other | Admitting: Family

## 2015-04-07 ENCOUNTER — Encounter: Attending: Internal Medicine | Primary: Family Medicine

## 2015-04-08 ENCOUNTER — Other Ambulatory Visit: Payer: Self-pay

## 2015-04-14 ENCOUNTER — Ambulatory Visit (INDEPENDENT_AMBULATORY_CARE_PROVIDER_SITE_OTHER): Payer: Medicare Other | Admitting: Gastroenterology

## 2015-04-14 ENCOUNTER — Encounter: Payer: Self-pay | Admitting: Gastroenterology

## 2015-04-14 VITALS — BP 122/68 | HR 64 | Ht 61.5 in | Wt 161.4 lb

## 2015-04-14 DIAGNOSIS — Z86010 Personal history of colonic polyps: Secondary | ICD-10-CM

## 2015-04-14 MED ORDER — NA SULFATE-K SULFATE-MG SULF 17.5-3.13-1.6 GM/177ML PO SOLN
1.0000 | Freq: Once | ORAL | Status: DC
Start: 2015-04-14 — End: 2015-06-03

## 2015-04-14 NOTE — Patient Instructions (Addendum)
You have been scheduled for a colonoscopy. Please follow written instructions given to you at your visit today.  Please pick up your prep supplies at the pharmacy within the next 1-3 days. If you use inhalers (even only as needed), please bring them with you on the day of your procedure. Your physician has requested that you go to www.startemmi.com and enter the access code given to you at your visit today. This web site gives a general overview about your procedure. However, you should still follow specific instructions given to you by our office regarding your preparation for the procedure.  Normal BMI (Body Mass Index- based on height and weight) is between 19 and 25. Your BMI today is Body mass index is 30.01 kg/(m^2). Marland Kitchen Please consider follow up  regarding your BMI with your Primary Care Provider.  Thank you for choosing me and [facility].  Pricilla Riffle. Dagoberto Ligas., MD., Marval Regal  cc: Colin Benton, MD

## 2015-04-14 NOTE — Progress Notes (Signed)
    History of Present Illness: This is a 79 year old female referred by Dr. Colin Benton for the evaluation of a history of colon polyps. The patient relocated to [place] from [place], [place]  where she lived for many years. She states she's had multiple colonoscopies over the years in [place]. We have requested records twice from her prior gastroenterologist however we have not received them. She states she has had precancerous polyps removed on every colonoscopy and she has had several five-year interval surveillance colonoscopies performed. Her last colonoscopy was in 2010. She has no gastrointestinal complaints. Denies weight loss, abdominal pain, constipation, diarrhea, change in stool caliber, melena, hematochezia, nausea, vomiting, dysphagia, reflux symptoms, chest pain.  Review of Systems: Pertinent positive and negative review of systems were noted in the above HPI section. All other review of systems were otherwise negative.  Current Medications, Allergies, Past Medical History, Past Surgical History, Family History and Social History were reviewed in [employer] record.  Physical Exam: General: Well developed, well nourished, no acute distress Head: Normocephalic and atraumatic Eyes:  sclerae anicteric, EOMI Ears: Normal auditory acuity Mouth: No deformity or lesions Neck: Supple, no masses or thyromegaly Lungs: Clear throughout to auscultation Heart: Regular rate and rhythm; no murmurs, rubs or bruits Abdomen: Soft, non tender and non distended. No masses, hepatosplenomegaly or hernias noted. Normal Bowel sounds Rectal: Deferred to colonoscopy  Musculoskeletal: Symmetrical with no gross deformities  Skin: No lesions on visible extremities Pulses:  Normal pulses noted Extremities: No clubbing, cyanosis, edema or deformities noted Neurological: Alert oriented x 4, grossly nonfocal Cervical Nodes:  No significant cervical adenopathy Inguinal Nodes: No  significant inguinal adenopathy Psychological:  Alert and cooperative. Normal mood and affect  Assessment and Recommendations:  1. Personal history of precancerous colon polyps. Will continue to attempt to obtain her records. She is quite clear on her history and it appears she is one year overdue for routine five-year interval surveillance colonoscopy. Schedule colonoscopy. The risks (including bleeding, perforation, infection, missed lesions, medication reactions and possible hospitalization or surgery if complications occur), benefits, and alternatives to colonoscopy with possible biopsy and possible polypectomy were discussed with the patient and they consent to proceed.   cc: Dr. Colin Benton

## 2015-05-12 ENCOUNTER — Ambulatory Visit
Admission: RE | Admit: 2015-05-12 | Discharge: 2015-05-12 | Disposition: A | Discharge status: Home / Self Care | Payer: Medicare Other | Source: Ambulatory Visit

## 2015-05-12 DIAGNOSIS — Z1231 Encounter for screening mammogram for malignant neoplasm of breast: Secondary | ICD-10-CM

## 2015-06-03 ENCOUNTER — Ambulatory Visit (AMBULATORY_SURGERY_CENTER): Payer: Medicare Other | Admitting: Gastroenterology

## 2015-06-03 ENCOUNTER — Encounter: Payer: Self-pay | Admitting: Gastroenterology

## 2015-06-03 VITALS — BP 124/81 | HR 56 | Temp 97.9°F | Resp 13 | Ht 61.5 in | Wt 161.0 lb

## 2015-06-03 DIAGNOSIS — Z86010 Personal history of colonic polyps: Secondary | ICD-10-CM

## 2015-06-03 DIAGNOSIS — D122 Benign neoplasm of ascending colon: Secondary | ICD-10-CM

## 2015-06-03 DIAGNOSIS — D123 Benign neoplasm of transverse colon: Secondary | ICD-10-CM | POA: Diagnosis not present

## 2015-06-03 DIAGNOSIS — K635 Polyp of colon: Secondary | ICD-10-CM

## 2015-06-03 DIAGNOSIS — E039 Hypothyroidism, unspecified: Secondary | ICD-10-CM | POA: Diagnosis not present

## 2015-06-03 DIAGNOSIS — I10 Essential (primary) hypertension: Secondary | ICD-10-CM | POA: Diagnosis not present

## 2015-06-03 MED ORDER — SODIUM CHLORIDE 0.9 % IV SOLN: 500.0000 mL | INTRAVENOUS | Status: DC

## 2015-06-03 NOTE — Op Note (Signed)
[facility]  [address] [place], [id number]   COLONOSCOPY PROCEDURE REPORT PATIENT: Felicia, Kelly  MR#: [id number] BIRTHDATE: Mar 13, 1936 , 79  yrs. old GENDER: female ENDOSCOPIST: Ladene Artist, MD, Jolyn Lent PROCEDURE DATE:  06/03/2015 PROCEDURE:   Colonoscopy, surveillance , Colonoscopy with biopsy, and Colonoscopy with snare polypectomy First Screening Colonoscopy - Avg.  risk and is 50 yrs.  old or older - No.  Prior Negative Screening - Now for repeat screening. N/A  History of Adenoma - Now for follow-up colonoscopy & has been > or = to 3 yrs.  Yes hx of adenoma.  Has been 3 or more years since last colonoscopy.  Polyps removed today? Yes ASA CLASS:   Class III INDICATIONS:Surveillance due to prior colonic neoplasia and PH Colon Adenoma. MEDICATIONS: Monitored anesthesia care and Propofol 200 mg IV DESCRIPTION OF PROCEDURE:   After the risks benefits and alternatives of the procedure were thoroughly explained, informed consent was obtained.  The digital rectal exam revealed no abnormalities of the rectum.   The LB [id number]  endoscope was introduced through the anus and advanced to the cecum, which was identified by both the appendix and ileocecal valve. No adverse events experienced.   The quality of the prep was adequate (Suprep was used)  The instrument was then slowly withdrawn as the colon was fully examined. Estimated blood loss is zero unless otherwise noted in this procedure report.  COLON FINDINGS: A sessile polyp measuring 6 mm in size was found in the ascending colon.  A polypectomy was performed with a cold snare.  The resection was complete, the polyp tissue was completely retrieved and sent to histology.   Two sessile polyps measuring 5 mm in size were found in the transverse colon.  Polypectomies were performed with cold forceps.  The resection was complete, the polyp tissue was completely retrieved and sent to histology.    Melanosis coli was found throughout the entire examined colon.   There was moderate diverticulosis noted in the sigmoid colon and descending colon with associated colonic spasm, colonic narrowing, tortuosity and muscular hypertrophy.   There was mild diverticulosis noted in the transverse colon.   The examination was otherwise normal. Retroflexed views revealed no abnormalities. The time to cecum = 3.9 Withdrawal time = 10.5   The scope was withdrawn and the procedure completed. COMPLICATIONS: There were no immediate complications. ENDOSCOPIC IMPRESSION: 1.   Sessile polyp in the ascending colon; polypectomy performed with a cold snare 2.   Two sessile polyps in the transverse colon; polypectomies performed with cold forceps 3.   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   Moderate diverticulosis in the sigmoid colon and descending colon 5.   Mild diverticulosis in the transverse colon RECOMMENDATIONS: 1.  Await pathology results 2.  High fiber diet with liberal fluid intake. 3.  Given your age, you will not need another colonoscopy for colon cancer screening or polyp surveillance.  These types of tests usually stop around the age 80.  eSigned:  Ladene Artist, MD, [facility] 06/03/2015 10:23 AM

## 2015-06-03 NOTE — Progress Notes (Signed)
Called to room to assist during endoscopic procedure.  Patient ID and intended procedure confirmed with present staff. Received instructions for my participation in the procedure from the performing physician.  

## 2015-06-03 NOTE — Progress Notes (Signed)
Report to PACU, RN, vss, BBS= Clear.  

## 2015-06-03 NOTE — Patient Instructions (Signed)
YOU HAD AN ENDOSCOPIC PROCEDURE TODAY AT [facility]:   Refer to the procedure report that was given to you for any specific questions about what was found during the examination.  If the procedure report does not answer your questions, please call your gastroenterologist to clarify.  If you requested that your care partner not be given the details of your procedure findings, then the procedure report has been included in a sealed envelope for you to review at your convenience later.  YOU SHOULD EXPECT: Some feelings of bloating in the abdomen. Passage of more gas than usual.  Walking can help get rid of the air that was put into your GI tract during the procedure and reduce the bloating. If you had a lower endoscopy (such as a colonoscopy or flexible sigmoidoscopy) you may notice spotting of blood in your stool or on the toilet paper. If you underwent a bowel prep for your procedure, you may not have a normal bowel movement for a few days.  Please Note:  You might notice some irritation and congestion in your nose or some drainage.  This is from the oxygen used during your procedure.  There is no need for concern and it should clear up in a day or so.  SYMPTOMS TO REPORT IMMEDIATELY:   Following lower endoscopy (colonoscopy or flexible sigmoidoscopy):  Excessive amounts of blood in the stool  Significant tenderness or worsening of abdominal pains  Swelling of the abdomen that is new, acute  Fever of 100F or higher  For urgent or emergent issues, a gastroenterologist can be reached at any hour by calling [phone].  DIET: Your first meal following the procedure should be a small meal and then it is ok to progress to your normal diet. Heavy or fried foods are harder to digest and may make you feel nauseous or bloated.  Likewise, meals heavy in dairy and vegetables can increase bloating.  Drink plenty of fluids but you should avoid alcoholic beverages for 24 hours.  ACTIVITY:   You should plan to take it easy for the rest of today and you should NOT DRIVE or use heavy machinery until tomorrow (because of the sedation medicines used during the test).    FOLLOW UP: Our staff will call the number listed on your records the next business day following your procedure to check on you and address any questions or concerns that you may have regarding the information given to you following your procedure. If we do not reach you, we will leave a message.  However, if you are feeling well and you are not experiencing any problems, there is no need to return our call.  We will assume that you have returned to your regular daily activities without incident.  If any biopsies were taken you will be contacted by phone or by letter within the next 1-3 weeks.  Please call us at [phone] if you have not heard about the biopsies in 3 weeks.   SIGNATURES/CONFIDENTIALITY: You and/or your care partner have signed paperwork which will be entered into your electronic medical record.  These signatures attest to the fact that that the information above on your After Visit Summary has been reviewed and is understood.  Full responsibility of the confidentiality of this discharge information lies with you and/or your care-partner.  Await pathology  Please read over handouts about polyps, diverticulosis and high fiber diets  Push fluids, follow a high fiber diet

## 2015-06-04 ENCOUNTER — Telehealth: Payer: Self-pay | Admitting: *Deleted

## 2015-06-04 NOTE — Telephone Encounter (Signed)
  Follow up Call-  Call back number 06/03/2015  Post procedure Call Back phone  # [phone]  Permission to leave phone message Yes     Patient questions:  Do you have a fever, pain , or abdominal swelling? No. Pain Score  0 *  Have you tolerated food without any problems? Yes.    Have you been able to return to your normal activities? Yes.    Do you have any questions about your discharge instructions: Diet   No. Medications  No. Follow up visit  No.  Do you have questions or concerns about your Care? No.  Actions: * If pain score is 4 or above: No action needed, pain <4.

## 2015-06-10 ENCOUNTER — Encounter: Payer: Self-pay | Admitting: Gastroenterology

## 2015-07-21 ENCOUNTER — Encounter: Attending: Internal Medicine | Primary: Family Medicine

## 2015-08-12 DIAGNOSIS — Z23 Encounter for immunization: Secondary | ICD-10-CM | POA: Diagnosis not present

## 2015-09-06 DIAGNOSIS — Z961 Presence of intraocular lens: Secondary | ICD-10-CM | POA: Diagnosis not present

## 2015-09-06 DIAGNOSIS — H3540 Unspecified peripheral retinal degeneration: Secondary | ICD-10-CM | POA: Diagnosis not present

## 2015-10-16 ENCOUNTER — Other Ambulatory Visit: Payer: Self-pay | Admitting: Family

## 2015-11-05 DIAGNOSIS — Z1283 Encounter for screening for malignant neoplasm of skin: Secondary | ICD-10-CM | POA: Diagnosis not present

## 2015-11-05 DIAGNOSIS — L723 Sebaceous cyst: Secondary | ICD-10-CM | POA: Diagnosis not present

## 2015-11-15 ENCOUNTER — Encounter: Payer: Self-pay | Admitting: Family Medicine

## 2015-11-15 ENCOUNTER — Ambulatory Visit (INDEPENDENT_AMBULATORY_CARE_PROVIDER_SITE_OTHER): Payer: Medicare Other | Admitting: Family Medicine

## 2015-11-15 VITALS — BP 118/60 | HR 58 | Temp 97.7°F | Ht 61.5 in | Wt 158.9 lb

## 2015-11-15 DIAGNOSIS — Z23 Encounter for immunization: Secondary | ICD-10-CM | POA: Diagnosis not present

## 2015-11-15 DIAGNOSIS — A6000 Herpesviral infection of urogenital system, unspecified: Secondary | ICD-10-CM

## 2015-11-15 DIAGNOSIS — I10 Essential (primary) hypertension: Secondary | ICD-10-CM | POA: Diagnosis not present

## 2015-11-15 DIAGNOSIS — E038 Other specified hypothyroidism: Secondary | ICD-10-CM | POA: Diagnosis not present

## 2015-11-15 DIAGNOSIS — R251 Tremor, unspecified: Secondary | ICD-10-CM

## 2015-11-15 DIAGNOSIS — E785 Hyperlipidemia, unspecified: Secondary | ICD-10-CM | POA: Diagnosis not present

## 2015-11-15 DIAGNOSIS — M810 Age-related osteoporosis without current pathological fracture: Secondary | ICD-10-CM

## 2015-11-15 LAB — BASIC METABOLIC PANEL
BUN: 24 mg/dL — ABNORMAL HIGH (ref 6–23)
CO2: 27 mEq/L (ref 19–32)
Calcium: 9.4 mg/dL (ref 8.4–10.5)
Chloride: 108 mEq/L (ref 96–112)
Creatinine, Ser: 0.83 mg/dL (ref 0.40–1.20)
GFR: 70.7 mL/min (ref >60.00)
Glucose, Bld: 104 mg/dL — ABNORMAL HIGH (ref 70–99)
Potassium: 4.4 mEq/L (ref 3.5–5.1)
Sodium: 145 mEq/L (ref 135–145)

## 2015-11-15 LAB — LIPID PANEL
Cholesterol: 166 mg/dL (ref 0–200)
HDL: 47 mg/dL (ref >39.00)
LDL Cholesterol: 98 mg/dL (ref 0–99)
NonHDL: 118.96
Total CHOL/HDL Ratio: 4
Triglycerides: 107 mg/dL (ref 0.0–149.0)
VLDL: 21.4 mg/dL (ref 0.0–40.0)

## 2015-11-15 LAB — TSH: TSH: 0.46 u[IU]/mL (ref 0.35–4.50)

## 2015-11-15 MED ORDER — VALACYCLOVIR HCL 500 MG PO TABS: ORAL_TABLET | ORAL | Status: DC

## 2015-11-15 MED ORDER — VALSARTAN-HYDROCHLOROTHIAZIDE 160-12.5 MG PO TABS: 0.5000 | ORAL_TABLET | Freq: Every day | ORAL | Status: DC

## 2015-11-15 MED ORDER — PRAVASTATIN SODIUM 80 MG PO TABS: 80.0000 mg | ORAL_TABLET | Freq: Every day | ORAL | Status: DC

## 2015-11-15 MED ORDER — ATENOLOL 25 MG PO TABS: 12.5000 mg | ORAL_TABLET | Freq: Every day | ORAL | Status: DC

## 2015-11-15 NOTE — Progress Notes (Signed)
HPI:  Felicia Kelly is here to establish care. She used to see Roxy Cedar. Last PCP and physical: 09/2014  Has the following chronic problems that require follow up and concerns today:  HTN/Tremors: -meds: asa, valsartan-hct 160/12.5 (takes 1/2 tablet), atenolol 25 (takes 1/2 tablet) for tremors - regimen with prior PCP -denies: CP, SOB, DOE, swelling, palpitations -mother and sister died of MI (78 yo and 39yo)  Hypothyroidism: -seeing Dr. Buddy Duty in endocrinology -s/p RAI  -no hx thyroid cancer -meds: synthroid 39mcg daily  HLD: -meds: pravastatin 80mg  -denies leg cramps  Osteoporosis: -meds: fosamax for 5 years but stopped about 6 months ago -reports last dexa > 5 years ago -takes vit D -no exercise  Genital herpes: -takes suppressive therapy -one outbreak in the past -got this from her husband, he passed several years  -she wishes to continue suppressive therapy in case becomes sexually active  ROS negative for unless reported above: fevers, unintentional weight loss, hearing or vision loss, chest pain, palpitations, struggling to breath, hemoptysis, melena, hematochezia, hematuria, falls, loc, si, thoughts of self harm  Past Medical History  Diagnosis Date  . Arthritis   . Hypertension   . Hyperlipidemia   . Thyroid disease     seeing Dr. Buddy Duty  . Colonic polyp   . Tremor   . Osteoporosis   . Genital herpes     Past Surgical History  Procedure Laterality Date  . Breast surgery  1985    biopsy  . Appendectomy  1988  . Abdominal hysterectomy  1988  . Total hip arthroplasty    . Foot surgery      Family History  Problem Relation Age of Onset  . Arthritis Mother   . Hyperlipidemia Mother   . Hypertension Mother   . Heart disease Mother   . Diabetes Mother   . Cancer Father     pancreatic  . Pancreatic cancer Father   . Heart disease Father   . Heart attack Sister 56  . Liver disease Brother     Social History   Social History  .  Marital Status: Widowed    Spouse Name: N/A  . Number of Children: N/A  . Years of Education: N/A   Social History Main Topics  . Smoking status: Former Research scientist (life sciences)  . Smokeless tobacco: Never Used  . Alcohol Use: No  . Drug Use: No  . Sexual Activity: Not Asked   Other Topics Concern  . None   Social History Narrative   Work or School: none      Home Situation: widower, lives alone      Spiritual Beliefs: Christian      Lifestyle: no regular exercise; diet is ok           Current outpatient prescriptions:  .  alendronate (FOSAMAX) 70 MG tablet, TAKE 1 TABLET BY MOUTH EVERY 7 DAYS. TAKE WITH A FULL GLASS OF WATER ON AN EMPTY STOMACH, Disp: 12 tablet, Rfl: 1 .  ascorbic acid (VITAMIN C) 500 MG tablet, Take 500 mg by mouth daily., Disp: , Rfl:  .  aspirin 81 MG tablet, Take 81 mg by mouth daily., Disp: , Rfl:  .  atenolol (TENORMIN) 25 MG tablet, Take 0.5 tablets (12.5 mg total) by mouth daily., Disp: 45 tablet, Rfl: 3 .  levothyroxine (SYNTHROID, LEVOTHROID) 50 MCG tablet, Take 50 mcg by mouth daily before breakfast., Disp: , Rfl:  .  Multiple Vitamin (MULTIVITAMIN) capsule, Take 1 capsule by mouth daily., Disp: , Rfl:  .  pravastatin (PRAVACHOL) 80 MG tablet, Take 1 tablet (80 mg total) by mouth daily., Disp: 90 tablet, Rfl: 3 .  valACYclovir (VALTREX) 500 MG tablet, TAKE 1 TABLET (500 MG TOTAL) BY MOUTH DAILY., Disp: 90 tablet, Rfl: 1 .  valsartan-hydrochlorothiazide (DIOVAN-HCT) 160-12.5 MG tablet, Take 0.5 tablets by mouth daily., Disp: 45 tablet, Rfl: 3  EXAM:  Filed Vitals:   11/15/15 1058  BP: 118/60  Pulse: 58  Temp: 97.7 F (36.5 C)    Body mass index is 29.54 kg/(m^2).  GENERAL: vitals reviewed and listed above, alert, oriented, appears well hydrated and in no acute distress  HEENT: atraumatic, conjunttiva clear, no obvious abnormalities on inspection of external nose and ears  NECK: no obvious masses on inspection  LUNGS: clear to auscultation  bilaterally, no wheezes, rales or rhonchi, good air movement  CV: HRRR, no peripheral edema  MS: moves all extremities without noticeable abnormality  PSYCH: pleasant and cooperative, no obvious depression or anxiety  ASSESSMENT AND PLAN:  Discussed the following assessment and plan:  Essential hypertension - Plan: Basic metabolic panel  Hyperlipemia - Plan: Lipid Panel - cont current treatment and advised regular exercise  Other specified hypothyroidism - Plan: TSH  Genital herpes - wishes to continue suppressive therapy  Tremor - cont current treatment  Osteoporosis - Plan: DG Bone Density  -off fosmax now, will consider restarting pending results  -We reviewed the PMH, PSH, FH, SH, Meds and Allergies. -We provided refills for any medications we will prescribe as needed. -We addressed current concerns per orders and patient instructions. -We have asked for records for pertinent exams, studies, vaccines and notes from previous providers. -We have advised patient to follow up per instructions below.   -Patient advised to return or notify a doctor immediately if symptoms worsen or persist or new concerns arise.  Patient Instructions  BEFORE YOU LEAVE: -PPSV23 -labs -schedule Medicare Annual Wellness Visit in 3-4 months  -We have ordered labs or studies at this visit. It can take up to 1-2 weeks for results and processing. We will contact you with instructions IF your results are abnormal. Normal results will be released to your [facility] If you have not heard from [place] or can not find your results in [facility] in 2 weeks please contact our office.  We recommend the following healthy lifestyle measures: - eat a healthy whole foods diet consisting of regular small meals composed of vegetables, fruits, beans, nuts, seeds, healthy meats such as white chicken and fish and whole grains.  - avoid sweets, white starchy foods, fried foods, fast food, processed foods, sodas, red meet  and other fattening foods.  - get a least 150-300 minutes of aerobic exercise per week.   -We placed a referral for you as discussed to have the bone density test at the breast center per your request. It usually takes about 1-2 weeks to process and schedule this referral. If you have not heard from [place] regarding this appointment in 2 weeks please contact our office.           Colin Benton R.

## 2015-11-15 NOTE — Addendum Note (Signed)
Addended by: Agnes Lawrence on: 11/15/2015 11:54 AM   Modules accepted: Orders

## 2015-11-15 NOTE — Progress Notes (Signed)
Pre visit review using our clinic review tool, if applicable. No additional management support is needed unless otherwise documented below in the visit note. 

## 2015-11-15 NOTE — Patient Instructions (Signed)
BEFORE YOU LEAVE: -PPSV23 -labs -schedule Medicare Annual Wellness Visit in 3-4 months  -We have ordered labs or studies at this visit. It can take up to 1-2 weeks for results and processing. We will contact you with instructions IF your results are abnormal. Normal results will be released to your [facility] If you have not heard from [place] or can not find your results in [facility] in 2 weeks please contact our office.  We recommend the following healthy lifestyle measures: - eat a healthy whole foods diet consisting of regular small meals composed of vegetables, fruits, beans, nuts, seeds, healthy meats such as white chicken and fish and whole grains.  - avoid sweets, white starchy foods, fried foods, fast food, processed foods, sodas, red meet and other fattening foods.  - get a least 150-300 minutes of aerobic exercise per week.   -We placed a referral for you as discussed to have the bone density test at the breast center per your request. It usually takes about 1-2 weeks to process and schedule this referral. If you have not heard from [place] regarding this appointment in 2 weeks please contact our office.

## 2015-11-16 ENCOUNTER — Encounter: Payer: Self-pay | Admitting: *Deleted

## 2015-11-18 DIAGNOSIS — L723 Sebaceous cyst: Secondary | ICD-10-CM | POA: Diagnosis not present

## 2015-11-18 DIAGNOSIS — X32XXXD Exposure to sunlight, subsequent encounter: Secondary | ICD-10-CM | POA: Diagnosis not present

## 2015-11-18 DIAGNOSIS — L570 Actinic keratosis: Secondary | ICD-10-CM | POA: Diagnosis not present

## 2015-11-23 DIAGNOSIS — Z7689 Persons encountering health services in other specified circumstances: Secondary | ICD-10-CM

## 2015-11-25 ENCOUNTER — Telehealth: Payer: Self-pay | Admitting: Family Medicine

## 2015-11-25 NOTE — Telephone Encounter (Signed)
She has been working with a Clinical research associate and doing exercise for some time and won't be doing anything different but her gym now requires a form from doctor to participate. She is not having any cardiac or resp symptoms with exercise. Form completed per her wishes/

## 2015-12-08 DIAGNOSIS — E0510 Thyrotoxicosis with toxic single thyroid nodule without thyrotoxic crisis or storm: Secondary | ICD-10-CM | POA: Diagnosis not present

## 2015-12-08 DIAGNOSIS — E890 Postprocedural hypothyroidism: Secondary | ICD-10-CM | POA: Diagnosis not present

## 2015-12-14 ENCOUNTER — Other Ambulatory Visit: Payer: Self-pay

## 2015-12-14 DIAGNOSIS — Z1231 Encounter for screening mammogram for malignant neoplasm of breast: Secondary | ICD-10-CM

## 2015-12-26 MED ORDER — LEVOTHYROXINE 50 MCG TAB
50 mcg | ORAL_TABLET | Freq: Every day | ORAL | 5 refills | Status: DC
Start: 2015-12-26 — End: 2016-06-15

## 2016-02-08 ENCOUNTER — Encounter: Payer: Self-pay | Admitting: Family Medicine

## 2016-02-08 ENCOUNTER — Ambulatory Visit (INDEPENDENT_AMBULATORY_CARE_PROVIDER_SITE_OTHER): Payer: Medicare Other | Admitting: Family Medicine

## 2016-02-08 VITALS — BP 102/78 | HR 58 | Temp 98.0°F | Ht 61.5 in | Wt 158.3 lb

## 2016-02-08 DIAGNOSIS — Z8249 Family history of ischemic heart disease and other diseases of the circulatory system: Secondary | ICD-10-CM

## 2016-02-08 DIAGNOSIS — E038 Other specified hypothyroidism: Secondary | ICD-10-CM

## 2016-02-08 DIAGNOSIS — I10 Essential (primary) hypertension: Secondary | ICD-10-CM | POA: Diagnosis not present

## 2016-02-08 DIAGNOSIS — Z0000 Encounter for general adult medical examination without abnormal findings: Secondary | ICD-10-CM | POA: Diagnosis not present

## 2016-02-08 DIAGNOSIS — I779 Disorder of arteries and arterioles, unspecified: Secondary | ICD-10-CM

## 2016-02-08 DIAGNOSIS — E7800 Pure hypercholesterolemia, unspecified: Secondary | ICD-10-CM | POA: Diagnosis not present

## 2016-02-08 DIAGNOSIS — I739 Peripheral vascular disease, unspecified: Secondary | ICD-10-CM

## 2016-02-08 NOTE — Patient Instructions (Signed)
Before you leave:  Schedule follow-up in about 4-6 months  We recommend the following healthy lifestyle measures: - eat a healthy whole foods diet consisting of regular small meals composed of vegetables, fruits, beans, nuts, seeds, healthy meats such as white chicken and fish and whole grains.  - avoid sweets, white starchy foods, fried foods, fast food, processed foods, sodas, red meet and other fattening foods.  - get a least 150-300 minutes of aerobic exercise per week.   -We placed a referral for you as discussed to the cardiologist, Dr. [name]. It usually takes about 1-2 weeks to process and schedule this referral. If you have not heard from [place] regarding this appointment in 2 weeks please contact our office.

## 2016-02-08 NOTE — Progress Notes (Signed)
Medicare Annual Preventive Care Visit  (initial annual wellness or annual wellness exam)  Concerns and/or follow up today:  HTN/Tremors: -meds: asa, valsartan-hct 160/12.5 (takes 1/2 tablet), atenolol 25 (takes 1/2 tablet) for tremors - regimen with prior PCP -denies: CP, SOB, DOE, swelling, palpitations -mother and sister died of MI (78 yo and 40yo) -reports history of mild carotid artery disease, and strong family history of heart disease. She reports her sister sees Dr. [name] in cardiology for the same, and she requests referral for this.  Hypothyroidism: -seeing Dr. Buddy Duty in endocrinology  -s/p RAI  -no hx thyroid cancer -meds: synthroid 74mcg daily  HLD: -meds: pravastatin 80mg  -denies leg cramps -works out on regular basis with trainer  Osteoporosis: -meds: fosamax for 5 years but stopped about 6 months ago -reports last dexa > 5 years ago -takes vit D3 -no exercise -she is set up for a bone density in may  Genital herpes: -takes suppressive therapy -one outbreak in the past -got this from her husband, he passed several years ago -she wishes to continue suppressive therapy in case becomes sexually active  ROS: negative for report of fevers, unintentional weight loss, vision changes, vision loss, hearing loss or change, chest pain, sob, hemoptysis, melena, hematochezia, hematuria, genital discharge or lesions, falls, bleeding or bruising, loc, thoughts of suicide or self harm, memory loss  1.) Patient-completed health risk assessment  - completed and reviewed, see scanned documentation  2.) Review of Medical History: -PMH, PSH, Family History and current specialty and care providers reviewed and updated and listed below  - see scanned in document in chart and below  Past Medical History  Diagnosis Date  . Arthritis   . Hypertension   . Hyperlipidemia   . Thyroid disease     seeing Dr. Buddy Duty  . Colonic polyp   . Tremor   . Osteoporosis   . Genital herpes      Past Surgical History  Procedure Laterality Date  . Breast surgery  1985    biopsy  . Appendectomy  1988  . Abdominal hysterectomy  1988    no cancer - reports total with bilat oophorectomy  . Total hip arthroplasty    . Foot surgery      Social History   Social History  . Marital Status: Widowed    Spouse Name: N/A  . Number of Children: N/A  . Years of Education: N/A   Occupational History  . Not on file.   Social History Main Topics  . Smoking status: Former Research scientist (life sciences)  . Smokeless tobacco: Never Used  . Alcohol Use: No  . Drug Use: No  . Sexual Activity: Not on file   Other Topics Concern  . Not on file   Social History Narrative   Work or School: none      Home Situation: widower, lives alone      Spiritual Beliefs: Christian      Lifestyle: no regular exercise; diet is ok          Family History  Problem Relation Age of Onset  . Arthritis Mother   . Hyperlipidemia Mother   . Hypertension Mother   . Heart disease Mother   . Diabetes Mother   . Cancer Father     pancreatic  . Pancreatic cancer Father   . Heart disease Father   . Heart attack Sister 19  . Liver disease Brother     Current Outpatient Prescriptions on File Prior to Visit  Medication Sig Dispense Refill  .  alendronate (FOSAMAX) 70 MG tablet TAKE 1 TABLET BY MOUTH EVERY 7 DAYS. TAKE WITH A FULL GLASS OF WATER ON AN EMPTY STOMACH 12 tablet 1  . ascorbic acid (VITAMIN C) 500 MG tablet Take 500 mg by mouth daily.    Marland Kitchen aspirin 81 MG tablet Take 81 mg by mouth daily.    Marland Kitchen atenolol (TENORMIN) 25 MG tablet Take 0.5 tablets (12.5 mg total) by mouth daily. 45 tablet 3  . levothyroxine (SYNTHROID, LEVOTHROID) 50 MCG tablet Take 50 mcg by mouth daily before breakfast.    . Multiple Vitamin (MULTIVITAMIN) capsule Take 1 capsule by mouth daily.    . pravastatin (PRAVACHOL) 80 MG tablet Take 1 tablet (80 mg total) by mouth daily. 90 tablet 3  . valACYclovir (VALTREX) 500 MG tablet TAKE 1 TABLET  (500 MG TOTAL) BY MOUTH DAILY. 90 tablet 1  . valsartan-hydrochlorothiazide (DIOVAN-HCT) 160-12.5 MG tablet Take 0.5 tablets by mouth daily. 45 tablet 3   No current facility-administered medications on file prior to visit.     3.) Review of functional ability and level of safety:  Any difficulty hearing?  Mild, eval with audiologist in the past and told ok, not interested in re-eval  History of falling?  NO  Any trouble with IADLs - using a phone, using transportation, grocery shopping, preparing meals, doing housework, doing laundry, taking medications and managing money? NO  Advance Directives? Yes - Living Will  See summary of recommendations in Patient Instructions below.  4.) Physical Exam Filed Vitals:   02/08/16 1120  BP: 102/78  Pulse: 58  Temp: 98 F (36.7 C)   Estimated body mass index is 29.43 kg/(m^2) as calculated from the following:   Height as of this encounter: 5' 1.5" (1.562 m).   Weight as of this encounter: 158 lb 4.8 oz (71.804 kg).  EKG (optional): deferred  General: alert, appear well hydrated and in no acute distress  HEENT: visual acuity grossly intact  CV: HRRR  Lungs: CTA bilaterally  Psych: pleasant and cooperative, no obvious depression or anxiety  Cognitive function grossly intact  See patient instructions for recommendations.  Education and counseling regarding the above review of health provided with a plan for the following: -see scanned patient completed form for further details -fall prevention strategies discussed  -healthy lifestyle discussed -importance and resources for completing advanced directives discussed -see patient instructions below for any other recommendations provided  4)The following written screening schedule of preventive measures were reviewed with assessment and plan made per below, orders and patient instructions:      AAA screening done if applicable - n/a     Alcohol screening done     Obesity  Screening and counseling done     STI screening (Hep C if born 33-65) offered, declined     Tobacco Screening done        Pneumococcal (PPSV23 -one dose after 64, one before if risk factors), influenza yearly and hepatitis B vaccines (if high risk - end stage renal disease, IV drugs, homosexual men, live in home for mentally retarded, hemophilia receiving factors) ASSESSMENT/PLAN: UTD      Screening mammograph (yearly if >40) ASSESSMENT/PLAN: utd - does in July      Screening Pap smear/pelvic exam (q2 years) ASSESSMENT/PLAN: n/a, hx abdominal hysterectomy, declined pelvic      Colorectal cancer screening (FOBT yearly or flex sig q4y or colonoscopy q10y or barium enema q4y) ASSESSMENT/PLAN: utd and per note screening no longer recommended  Diabetes outpatient self-management training services ASSESSMENT/PLAN: utd       Bone mass measurements(covered q2y if indicated - estrogen def, osteoporosis, hyperparathyroid, vertebral abnormalities, osteoporosis or steroids) ASSESSMENT/PLAN: scheduled for May      Screening for glaucoma(q1y if high risk - diabetes, FH, AA and > 50 or hispanic and > 65) ASSESSMENT/PLAN: utd, sees Dr. Rosana Hoes      Medical nutritional therapy for individuals with diabetes or renal disease ASSESSMENT/PLAN: see orders      Cardiovascular screening blood tests (lipids q5y) ASSESSMENT/PLAN: see orders and labs      Diabetes screening tests ASSESSMENT/PLAN: see orders and labs   7.) Summary:   Medicare annual wellness visit, subsequent -risk factors and conditions per above assessment were discussed and treatment, recommendations and referrals were offered per documentation above and orders and patient instructions.   Patient Instructions   Before you leave:  Schedule follow-up in about 4-6 months  We recommend the following healthy lifestyle measures: - eat a healthy whole foods diet consisting of regular small meals composed of vegetables, fruits,  beans, nuts, seeds, healthy meats such as white chicken and fish and whole grains.  - avoid sweets, white starchy foods, fried foods, fast food, processed foods, sodas, red meet and other fattening foods.  - get a least 150-300 minutes of aerobic exercise per week.   -We placed a referral for you as discussed to the cardiologist, Dr. [name]. It usually takes about 1-2 weeks to process and schedule this referral. If you have not heard from [place] regarding this appointment in 2 weeks please contact our office.

## 2016-02-08 NOTE — Progress Notes (Signed)
Pre visit review using our clinic review tool, if applicable. No additional management support is needed unless otherwise documented below in the visit note. 

## 2016-02-22 ENCOUNTER — Ambulatory Visit
Admission: RE | Admit: 2016-02-22 | Discharge: 2016-02-22 | Disposition: A | Discharge status: Home / Self Care | Payer: Medicare Other | Source: Ambulatory Visit | Attending: Family Medicine | Admitting: Family Medicine

## 2016-02-22 DIAGNOSIS — M810 Age-related osteoporosis without current pathological fracture: Secondary | ICD-10-CM

## 2016-02-22 DIAGNOSIS — Z780 Asymptomatic menopausal state: Secondary | ICD-10-CM | POA: Diagnosis not present

## 2016-02-22 DIAGNOSIS — M85832 Other specified disorders of bone density and structure, left forearm: Secondary | ICD-10-CM | POA: Diagnosis not present

## 2016-05-05 ENCOUNTER — Ambulatory Visit: Payer: Medicare Other | Admitting: Cardiology

## 2016-05-12 ENCOUNTER — Ambulatory Visit: Payer: Medicare Other

## 2016-05-19 DIAGNOSIS — L570 Actinic keratosis: Secondary | ICD-10-CM | POA: Diagnosis not present

## 2016-05-19 DIAGNOSIS — X32XXXD Exposure to sunlight, subsequent encounter: Secondary | ICD-10-CM | POA: Diagnosis not present

## 2016-05-19 DIAGNOSIS — L723 Sebaceous cyst: Secondary | ICD-10-CM | POA: Diagnosis not present

## 2016-06-13 ENCOUNTER — Ambulatory Visit: Payer: Medicare Other

## 2016-06-15 MED ORDER — LEVOTHYROXINE 50 MCG TAB
50 mcg | ORAL_TABLET | ORAL | 2 refills | Status: DC
Start: 2016-06-15 — End: 2016-07-13

## 2016-06-21 ENCOUNTER — Ambulatory Visit
Admission: RE | Admit: 2016-06-21 | Discharge: 2016-06-21 | Disposition: A | Discharge status: Home / Self Care | Payer: Medicare Other | Source: Ambulatory Visit

## 2016-06-21 DIAGNOSIS — Z1231 Encounter for screening mammogram for malignant neoplasm of breast: Secondary | ICD-10-CM

## 2016-06-22 DIAGNOSIS — Z23 Encounter for immunization: Secondary | ICD-10-CM | POA: Diagnosis not present

## 2016-07-10 NOTE — Progress Notes (Signed)
HPI:  HTN/Tremors: -meds: asa, valsartan-hct 160/12.5 (takes 1/2 tablet), atenolol 25 (takes 1/2 tablet) for tremors - regimen initiated with prior PCP -denies: CP, SOB, DOE, swelling, palpitations -mother and sister died of MI (78 yo and 53yo) -reported history of mild carotid artery disease, and strong family history of heart disease. She requested referral to Dr. [name] for this, which we did place  Hypothyroidism: -seeing Dr. Buddy Duty in endocrinology  -s/p RAI  -no hx thyroid cancer -meds: synthroid 36mcg daily  HLD: -meds: pravastatin 80mg  -denies leg cramps  Osteoporosis: -meds: fosamax for 5 years but stopped -had dexa 2017 with osteopenia -takes vit D3  Genital herpes: -takes suppressive therapy -one outbreak in the past -got this from her husband, he passed several years ago -she wishes to continue suppressive therapy in case becomes sexually active and requested refill  Dysuria: -acute onset several months ago -symptoms include urgency, frequency, bladder pressure, occ stress incontinence -denies: fevers, malaise, hematuria, flank pain -hx abd hysterectomy  Brother inlaw ([place] hyatt's husband) passed suddenly over the weakened. Processing. Coping well considering.  ROS: See pertinent positives and negatives per HPI.  Past Medical History:  Diagnosis Date  . Arthritis   . Colonic polyp   . Genital herpes   . Hyperlipidemia   . Hypertension   . Osteoporosis   . Thyroid disease    seeing Dr. Buddy Duty  . Tremor     Past Surgical History:  Procedure Laterality Date  . ABDOMINAL HYSTERECTOMY  1988   no cancer - reports total with bilat oophorectomy  . APPENDECTOMY  1988  . BREAST SURGERY  1985   biopsy  . FOOT SURGERY    . TOTAL HIP ARTHROPLASTY      Family History  Problem Relation Age of Onset  . Arthritis Mother   . Hyperlipidemia Mother   . Hypertension Mother   . Heart disease Mother   . Diabetes Mother   . Cancer Father     pancreatic   . Pancreatic cancer Father   . Heart disease Father   . Heart attack Sister 45  . Liver disease Brother     Social History   Social History  . Marital status: Widowed    Spouse name: N/A  . Number of children: N/A  . Years of education: N/A   Social History Main Topics  . Smoking status: Former Research scientist (life sciences)  . Smokeless tobacco: Never Used  . Alcohol use No  . Drug use: No  . Sexual activity: Not Asked   Other Topics Concern  . None   Social History Narrative   Work or School: none      Home Situation: widower, lives alone      Spiritual Beliefs: Christian      Lifestyle: no regular exercise; diet is ok           Current Outpatient Prescriptions:  .  ascorbic acid (VITAMIN C) 500 MG tablet, Take 500 mg by mouth daily., Disp: , Rfl:  .  aspirin 81 MG tablet, Take 81 mg by mouth daily., Disp: , Rfl:  .  atenolol (TENORMIN) 25 MG tablet, Take 0.5 tablets (12.5 mg total) by mouth daily., Disp: 45 tablet, Rfl: 3 .  levothyroxine (SYNTHROID, LEVOTHROID) 50 MCG tablet, Take 50 mcg by mouth daily before breakfast., Disp: , Rfl:  .  Multiple Vitamin (MULTIVITAMIN) capsule, Take 1 capsule by mouth daily., Disp: , Rfl:  .  pravastatin (PRAVACHOL) 80 MG tablet, Take 1 tablet (80 mg total) by mouth  daily., Disp: 90 tablet, Rfl: 3 .  valACYclovir (VALTREX) 500 MG tablet, TAKE 1 TABLET (500 MG TOTAL) BY MOUTH DAILY., Disp: 90 tablet, Rfl: 1 .  valsartan-hydrochlorothiazide (DIOVAN-HCT) 160-12.5 MG tablet, Take 0.5 tablets by mouth daily., Disp: 45 tablet, Rfl: 3  EXAM:  Vitals:   07/11/16 0951  BP: 98/70  Pulse: 60  Temp: 97.9 F (36.6 C)    Body mass index is 29.7 kg/m.  GENERAL: vitals reviewed and listed above, alert, oriented, appears well hydrated and in no acute distress  HEENT: atraumatic, conjunttiva clear, no obvious abnormalities on inspection of external nose and ears  NECK: no obvious masses on inspection  LUNGS: clear to auscultation bilaterally, no  wheezes, rales or rhonchi, good air movement  CV: HRRR, no peripheral edema  MS: moves all extremities without noticeable abnormality  PSYCH: pleasant and cooperative, no obvious depression or anxiety  ASSESSMENT AND PLAN:  Discussed the following assessment and plan:  Essential hypertension - Plan: Basic metabolic panel, CBC with Differential/Platelets  Tremor  Pure hypercholesterolemia  Other specified hypothyroidism - Plan: TSH  Genital herpes  Hyperglycemia - Plan: Hemoglobin A1c  Dysuria  Bereavement  -labs, urine studies -refills per request -counseled and supported on loss -if urine studies unrevealing she opted for urology evaluation and will place if needed pending results -Patient advised to return or notify a doctor immediately if symptoms worsen or persist or new concerns arise.  Patient Instructions  BEFORE YOU LEAVE: -urine dip and culture -labs -follow up: 4-6 months  I am so sorry for your loss. You and your family will be in my thoughts and prayers. Hang in there!  We have ordered labs or studies at this visit. It can take up to 1-2 weeks for results and processing. IF results require follow up or explanation, we will call you with instructions. Clinically stable results will be released to your [facility]. If you have not heard from [place] or cannot find your results in [facility] in 2 weeks please contact our office at [phone].  If you are not yet signed up for [facility], please consider signing up.  We recommend the following healthy lifestyle for LIFE: 1) Small portions.   Tip: eat off of a salad plate instead of a dinner plate.  Tip: It is ok to feel hungry after a meal - that likely means you ate an appropriate portion.  Tip: if you need more or a snack choose fruits, veggies and/or a handful of nuts or seeds.  2) Eat a healthy clean diet.  * Tip: Avoid (less then 1 serving per week): processed foods, sweets, sweetened drinks, white starches (rice,  flour, bread, potatoes, pasta, etc), red meat, fast foods, butter  *Tip: CHOOSE instead   * 5-9 servings per day of fresh or frozen fruits and vegetables (but not corn, potatoes, bananas, canned or dried fruit)   *nuts and seeds, beans   *olives and olive oil   *small portions of lean meats such as fish and white chicken    *small portions of whole grains  3)Get at least 150 minutes of sweaty aerobic exercise per week.  4)Reduce stress - consider counseling, meditation and relaxation to balance other aspects of your life.     Colin Benton R., DO

## 2016-07-11 ENCOUNTER — Ambulatory Visit (INDEPENDENT_AMBULATORY_CARE_PROVIDER_SITE_OTHER): Payer: Medicare Other | Admitting: Family Medicine

## 2016-07-11 ENCOUNTER — Encounter: Payer: Self-pay | Admitting: Family Medicine

## 2016-07-11 VITALS — BP 98/70 | HR 60 | Temp 97.9°F | Ht 61.5 in | Wt 159.8 lb

## 2016-07-11 DIAGNOSIS — E038 Other specified hypothyroidism: Secondary | ICD-10-CM

## 2016-07-11 DIAGNOSIS — R300 Dysuria: Secondary | ICD-10-CM

## 2016-07-11 DIAGNOSIS — Z634 Disappearance and death of family member: Secondary | ICD-10-CM

## 2016-07-11 DIAGNOSIS — R739 Hyperglycemia, unspecified: Secondary | ICD-10-CM

## 2016-07-11 DIAGNOSIS — I10 Essential (primary) hypertension: Secondary | ICD-10-CM

## 2016-07-11 DIAGNOSIS — E7800 Pure hypercholesterolemia, unspecified: Secondary | ICD-10-CM

## 2016-07-11 DIAGNOSIS — A6000 Herpesviral infection of urogenital system, unspecified: Secondary | ICD-10-CM

## 2016-07-11 DIAGNOSIS — R251 Tremor, unspecified: Secondary | ICD-10-CM

## 2016-07-11 LAB — POCT URINALYSIS DIPSTICK
Bilirubin, UA: NEGATIVE
Glucose, UA: NEGATIVE
Ketones, UA: NEGATIVE
Nitrite, UA: NEGATIVE
Protein, UA: NEGATIVE
Spec Grav, UA: 1.01
Urobilinogen, UA: 0.2
pH, UA: 6

## 2016-07-11 LAB — URINALYSIS, MICROSCOPIC ONLY

## 2016-07-11 LAB — BASIC METABOLIC PANEL
BUN: 17 mg/dL (ref 6–23)
CO2: 31 mEq/L (ref 19–32)
Calcium: 9.5 mg/dL (ref 8.4–10.5)
Chloride: 106 mEq/L (ref 96–112)
Creatinine, Ser: 0.84 mg/dL (ref 0.40–1.20)
GFR: 69.61 mL/min (ref >60.00)
Glucose, Bld: 97 mg/dL (ref 70–99)
Potassium: 4.1 mEq/L (ref 3.5–5.1)
Sodium: 143 mEq/L (ref 135–145)

## 2016-07-11 LAB — CBC WITH DIFFERENTIAL/PLATELET
Basophils Absolute: 0 10*3/uL (ref 0.0–0.1)
Basophils Relative: 0.6 % (ref 0.0–3.0)
Eosinophils Absolute: 0.2 10*3/uL (ref 0.0–0.7)
Eosinophils Relative: 3.3 % (ref 0.0–5.0)
HCT: 38.7 % (ref 36.0–46.0)
Hemoglobin: 13.2 g/dL (ref 12.0–15.0)
Lymphocytes Relative: 28.4 % (ref 12.0–46.0)
Lymphs Abs: 1.6 10*3/uL (ref 0.7–4.0)
MCHC: 34 g/dL (ref 30.0–36.0)
MCV: 88.7 fl (ref 78.0–100.0)
Monocytes Absolute: 0.3 10*3/uL (ref 0.1–1.0)
Monocytes Relative: 6.1 % (ref 3.0–12.0)
Neutro Abs: 3.5 10*3/uL (ref 1.4–7.7)
Neutrophils Relative %: 61.6 % (ref 43.0–77.0)
Platelets: 277 10*3/uL (ref 150.0–400.0)
RBC: 4.36 Mil/uL (ref 3.87–5.11)
RDW: 13.3 % (ref 11.5–15.5)
WBC: 5.6 10*3/uL (ref 4.0–10.5)

## 2016-07-11 LAB — HEMOGLOBIN A1C: Hgb A1c MFr Bld: 6 % (ref 4.6–6.5)

## 2016-07-11 LAB — TSH: TSH: 1.53 u[IU]/mL (ref 0.35–4.50)

## 2016-07-11 MED ORDER — VALACYCLOVIR HCL 500 MG PO TABS: ORAL_TABLET | ORAL | 1 refills | Status: DC

## 2016-07-11 MED ORDER — PRAVASTATIN SODIUM 80 MG PO TABS: 80.0000 mg | ORAL_TABLET | Freq: Every day | ORAL | 3 refills | Status: DC

## 2016-07-11 NOTE — Addendum Note (Signed)
Addended by: Agnes Lawrence on: 07/11/2016 11:17 AM   Modules accepted: Orders

## 2016-07-11 NOTE — Addendum Note (Signed)
Addended by: Lahoma Crocker A on: 07/11/2016 11:11 AM   Modules accepted: Orders

## 2016-07-11 NOTE — Progress Notes (Signed)
Pre visit review using our clinic review tool, if applicable. No additional management support is needed unless otherwise documented below in the visit note. 

## 2016-07-11 NOTE — Patient Instructions (Signed)
BEFORE YOU LEAVE: -urine dip and culture -labs -follow up: 4-6 months  I am so sorry for your loss. You and your family will be in my thoughts and prayers. Hang in there!  We have ordered labs or studies at this visit. It can take up to 1-2 weeks for results and processing. IF results require follow up or explanation, we will call you with instructions. Clinically stable results will be released to your [facility]. If you have not heard from [place] or cannot find your results in [facility] in 2 weeks please contact our office at [phone].  If you are not yet signed up for [facility], please consider signing up.  We recommend the following healthy lifestyle for LIFE: 1) Small portions.   Tip: eat off of a salad plate instead of a dinner plate.  Tip: It is ok to feel hungry after a meal - that likely means you ate an appropriate portion.  Tip: if you need more or a snack choose fruits, veggies and/or a handful of nuts or seeds.  2) Eat a healthy clean diet.  * Tip: Avoid (less then 1 serving per week): processed foods, sweets, sweetened drinks, white starches (rice, flour, bread, potatoes, pasta, etc), red meat, fast foods, butter  *Tip: CHOOSE instead   * 5-9 servings per day of fresh or frozen fruits and vegetables (but not corn, potatoes, bananas, canned or dried fruit)   *nuts and seeds, beans   *olives and olive oil   *small portions of lean meats such as fish and white chicken    *small portions of whole grains  3)Get at least 150 minutes of sweaty aerobic exercise per week.  4)Reduce stress - consider counseling, meditation and relaxation to balance other aspects of your life.

## 2016-07-12 LAB — URINE CULTURE: Organism ID, Bacteria: NO GROWTH

## 2016-07-13 NOTE — Telephone Encounter (Signed)
Requested Prescriptions     Pending Prescriptions Disp Refills   ??? levothyroxine (SYNTHROID) 50 mcg tablet 90 Tab 3     Sig: One tablet daily. She was to see new Endocrinologist in [place]. Future refills to them, unless she will return to see me for follow-up.    Per verbal order from Dr. Adriana Simasook, medication ordered, faxed/phoned in/paper copy given to patient.

## 2016-07-14 ENCOUNTER — Encounter: Payer: Self-pay | Admitting: *Deleted

## 2016-07-14 MED ORDER — LEVOTHYROXINE 50 MCG TAB
50 mcg | ORAL_TABLET | ORAL | 0 refills | Status: DC
Start: 2016-07-14 — End: 2016-10-09

## 2016-07-14 NOTE — Addendum Note (Signed)
Addended by: Agnes Lawrence on: 07/14/2016 10:01 AM   Modules accepted: Orders

## 2016-07-21 ENCOUNTER — Telehealth: Payer: Self-pay | Admitting: Family Medicine

## 2016-07-21 NOTE — Telephone Encounter (Signed)
Pt would like a call back.  Pt state that she has not her back from our office about her referrals.  Pt would really like to see Cardiologist Ena Dawley and they need her labs faxed over because she has a appointment in December (she had a family Hx of heart problems --Brother just past suddenly from a massive heart attack **Sister and Mother died of heart disease) Pt state that she can now go to the Urologist any day except next week Tues 10/10 (brothers funeral) and Wednesday 10/11 (dentist appointment).

## 2016-09-01 ENCOUNTER — Encounter: Payer: Self-pay | Admitting: Family Medicine

## 2016-09-01 ENCOUNTER — Ambulatory Visit (INDEPENDENT_AMBULATORY_CARE_PROVIDER_SITE_OTHER): Payer: Medicare Other | Admitting: Family Medicine

## 2016-09-01 VITALS — BP 108/64 | HR 80 | Ht 61.5 in | Wt 159.0 lb

## 2016-09-01 DIAGNOSIS — J069 Acute upper respiratory infection, unspecified: Secondary | ICD-10-CM

## 2016-09-01 DIAGNOSIS — B9789 Other viral agents as the cause of diseases classified elsewhere: Secondary | ICD-10-CM

## 2016-09-01 MED ORDER — BENZONATATE 100 MG PO CAPS: 100.0000 mg | ORAL_CAPSULE | Freq: Two times a day (BID) | ORAL | 0 refills | Status: DC | PRN

## 2016-09-01 NOTE — Progress Notes (Signed)
Pre visit review using our clinic review tool, if applicable. No additional management support is needed unless otherwise documented below in the visit note. 

## 2016-09-01 NOTE — Patient Instructions (Signed)
INSTRUCTIONS FOR UPPER RESPIRATORY INFECTION:  -plenty of rest and fluids  -nasal saline wash 2-3 times daily (use prepackaged nasal saline or bottled/distilled water if making your own)   -can use AFRIN nasal spray for drainage and nasal congestion if no glaucoma - but do NOT use longer then 3-4 days   -can use tylenol (in no history of liver disease) or ibuprofen (if no history of kidney disease, bowel bleeding or significant heart disease) as directed for aches and sorethroat  -in the winter time, using a humidifier at night is helpful (please follow cleaning instructions)  -if you are taking a cough medication - use only as directed, may also try a teaspoon of honey to coat the throat and throat lozenges.   -for sore throat, salt water gargles can help  -follow up if you have fevers, facial pain, tooth pain, difficulty breathing or are worsening or symptoms persist longer then expected  Upper Respiratory Infection, Adult An upper respiratory infection (URI) is also known as the common cold. It is often caused by a type of germ (virus). Colds are easily spread (contagious). You can pass it to others by kissing, coughing, sneezing, or drinking out of the same glass. Usually, you get better in 1 to 3  weeks.  However, the cough can last for even longer. HOME CARE   Only take medicine as told by your doctor. Follow instructions provided above.  Drink enough water and fluids to keep your pee (urine) clear or pale yellow.  Get plenty of rest.  Return to work when your temperature is < 100 for 24 hours or as told by your doctor. You may use a face mask and wash your hands to stop your cold from spreading. GET HELP RIGHT AWAY IF:   After the first few days, you feel you are getting worse.  You have questions about your medicine.  You have chills, shortness of breath, or red spit (mucus).  You have pain in the face for more then 1-2 days, especially when you bend forward.  You have  a fever, puffy (swollen) neck, pain when you swallow, or white spots in the back of your throat.  You have a bad headache, ear pain, sinus pain, or chest pain.  You have a high-pitched whistling sound when you breathe in and out (wheezing).  You cough up blood.  You have sore muscles or a stiff neck. MAKE SURE YOU:   Understand these instructions.  Will watch your condition.  Will get help right away if you are not doing well or get worse. Document Released: 03/20/2008 Document Revised: 12/25/2011 Document Reviewed: 01/07/2014 [facility] Patient Information 2015 [place], [place]. This information is not intended to replace advice given to you by your health care provider. Make sure you discuss any questions you have with your health care provider.

## 2016-09-01 NOTE — Progress Notes (Signed)
HPI:  URI: -started: 2-3 days ago -symptoms:nasal congestion, sore throat, cough - not productive -denies:fever, SOB, NVD, tooth pain, ear pain, rash, sinus pain, wheeze -has tried: musinex and an antibiotic she had at home -sick contacts/travel/risks: no reported flu, strep or tick exposure -Hx of: allergies -reports used to get this frequently and usually took a zpack  ROS: See pertinent positives and negatives per HPI.  Past Medical History:  Diagnosis Date  . Arthritis   . Colonic polyp   . Genital herpes   . Hyperlipidemia   . Hypertension   . Osteoporosis   . Thyroid disease    seeing Dr. Buddy Duty  . Tremor     Past Surgical History:  Procedure Laterality Date  . ABDOMINAL HYSTERECTOMY  1988   no cancer - reports total with bilat oophorectomy  . APPENDECTOMY  1988  . BREAST SURGERY  1985   biopsy  . FOOT SURGERY    . TOTAL HIP ARTHROPLASTY      Family History  Problem Relation Age of Onset  . Arthritis Mother   . Hyperlipidemia Mother   . Hypertension Mother   . Heart disease Mother   . Diabetes Mother   . Cancer Father     pancreatic  . Pancreatic cancer Father   . Heart disease Father   . Heart attack Sister 87  . Liver disease Brother     Social History   Social History  . Marital status: Widowed    Spouse name: N/A  . Number of children: N/A  . Years of education: N/A   Social History Main Topics  . Smoking status: Former Research scientist (life sciences)  . Smokeless tobacco: Never Used  . Alcohol use No  . Drug use: No  . Sexual activity: Not Asked   Other Topics Concern  . None   Social History Narrative   Work or School: none      Home Situation: widower, lives alone      Spiritual Beliefs: Christian      Lifestyle: no regular exercise; diet is ok           Current Outpatient Prescriptions:  .  ascorbic acid (VITAMIN C) 500 MG tablet, Take 500 mg by mouth daily., Disp: , Rfl:  .  aspirin 81 MG tablet, Take 81 mg by mouth daily., Disp: , Rfl:  .   atenolol (TENORMIN) 25 MG tablet, Take 0.5 tablets (12.5 mg total) by mouth daily., Disp: 45 tablet, Rfl: 3 .  levothyroxine (SYNTHROID, LEVOTHROID) 50 MCG tablet, Take 50 mcg by mouth daily before breakfast., Disp: , Rfl:  .  Multiple Vitamin (MULTIVITAMIN) capsule, Take 1 capsule by mouth daily., Disp: , Rfl:  .  pravastatin (PRAVACHOL) 80 MG tablet, Take 1 tablet (80 mg total) by mouth daily., Disp: 90 tablet, Rfl: 3 .  valACYclovir (VALTREX) 500 MG tablet, TAKE 1 TABLET (500 MG TOTAL) BY MOUTH DAILY., Disp: 90 tablet, Rfl: 1 .  valsartan-hydrochlorothiazide (DIOVAN-HCT) 160-12.5 MG tablet, Take 0.5 tablets by mouth daily., Disp: 45 tablet, Rfl: 3 .  benzonatate (TESSALON) 100 MG capsule, Take 1 capsule (100 mg total) by mouth 2 (two) times daily as needed for cough., Disp: 20 capsule, Rfl: 0  EXAM:  Vitals:   09/01/16 1316  BP: 108/64  Pulse: 80    Body mass index is 29.56 kg/m.  GENERAL: vitals reviewed and listed above, alert, oriented, appears well hydrated and in no acute distress  HEENT: atraumatic, conjunttiva clear, no obvious abnormalities on inspection of  external nose and ears, normal appearance of ear canals and TMs, clear nasal congestion, mild post oropharyngeal erythema with PND, no tonsillar edema or exudate, no sinus TTP  NECK: no obvious masses on inspection  LUNGS: clear to auscultation bilaterally, no wheezes, rales or rhonchi, good air movement  CV: HRRR, no peripheral edema  MS: moves all extremities without noticeable abnormality  PSYCH: pleasant and cooperative, no obvious depression or anxiety  ASSESSMENT AND PLAN:  Discussed the following assessment and plan:  Viral upper respiratory illness  -given HPI and exam findings today, a serious infection or illness is unlikely. We discussed potential etiologies, with VURI being most likely, and advised supportive care and monitoring. We discussed treatment side effects, likely course, antibiotic misuse,  transmission, and signs of developing a serious illness. -of course, we advised to return or notify a doctor immediately if symptoms worsen or persist or new concerns arise.  Addendum: pt drinking icy beverage on arrival so temperature not done; not warm on exam and denied fevers - assistant was to recheck after visit but this was not done. I did not realize not redone until pt already gone.  Patient Instructions  INSTRUCTIONS FOR UPPER RESPIRATORY INFECTION:  -plenty of rest and fluids  -nasal saline wash 2-3 times daily (use prepackaged nasal saline or bottled/distilled water if making your own)   -can use AFRIN nasal spray for drainage and nasal congestion if no glaucoma - but do NOT use longer then 3-4 days   -can use tylenol (in no history of liver disease) or ibuprofen (if no history of kidney disease, bowel bleeding or significant heart disease) as directed for aches and sorethroat  -in the winter time, using a humidifier at night is helpful (please follow cleaning instructions)  -if you are taking a cough medication - use only as directed, may also try a teaspoon of honey to coat the throat and throat lozenges.   -for sore throat, salt water gargles can help  -follow up if you have fevers, facial pain, tooth pain, difficulty breathing or are worsening or symptoms persist longer then expected  Upper Respiratory Infection, Adult An upper respiratory infection (URI) is also known as the common cold. It is often caused by a type of germ (virus). Colds are easily spread (contagious). You can pass it to others by kissing, coughing, sneezing, or drinking out of the same glass. Usually, you get better in 1 to 3  weeks.  However, the cough can last for even longer. HOME CARE   Only take medicine as told by your doctor. Follow instructions provided above.  Drink enough water and fluids to keep your pee (urine) clear or pale yellow.  Get plenty of rest.  Return to work when your  temperature is < 100 for 24 hours or as told by your doctor. You may use a face mask and wash your hands to stop your cold from spreading. GET HELP RIGHT AWAY IF:   After the first few days, you feel you are getting worse.  You have questions about your medicine.  You have chills, shortness of breath, or red spit (mucus).  You have pain in the face for more then 1-2 days, especially when you bend forward.  You have a fever, puffy (swollen) neck, pain when you swallow, or white spots in the back of your throat.  You have a bad headache, ear pain, sinus pain, or chest pain.  You have a high-pitched whistling sound when you breathe in and out (  wheezing).  You cough up blood.  You have sore muscles or a stiff neck. MAKE SURE YOU:   Understand these instructions.  Will watch your condition.  Will get help right away if you are not doing well or get worse. Document Released: 03/20/2008 Document Revised: 12/25/2011 Document Reviewed: 01/07/2014 [facility] Patient Information 2015 [place], [place]. This information is not intended to replace advice given to you by your health care provider. Make sure you discuss any questions you have with your health care provider.    Colin Benton R., DO

## 2016-09-05 ENCOUNTER — Encounter: Payer: Self-pay | Admitting: Family Medicine

## 2016-09-05 ENCOUNTER — Telehealth: Payer: Self-pay | Admitting: *Deleted

## 2016-09-05 DIAGNOSIS — H53141 Visual discomfort, right eye: Secondary | ICD-10-CM | POA: Diagnosis not present

## 2016-09-05 DIAGNOSIS — H43811 Vitreous degeneration, right eye: Secondary | ICD-10-CM | POA: Diagnosis not present

## 2016-09-05 DIAGNOSIS — Z961 Presence of intraocular lens: Secondary | ICD-10-CM | POA: Diagnosis not present

## 2016-09-05 NOTE — Telephone Encounter (Signed)
Patient's temperature was not taken prior to her leaving the office as she had been drinking cold fluids at the visit on Friday.  I called the pt to see how she was doing as Dr Maudie Mercury was concerned about this and she stated she is feeling a lot better today and message was sent to Dr Maudie Mercury as Juluis Rainier.

## 2016-09-14 DIAGNOSIS — N3281 Overactive bladder: Secondary | ICD-10-CM | POA: Diagnosis not present

## 2016-09-14 DIAGNOSIS — R3121 Asymptomatic microscopic hematuria: Secondary | ICD-10-CM | POA: Diagnosis not present

## 2016-09-21 DIAGNOSIS — B0229 Other postherpetic nervous system involvement: Secondary | ICD-10-CM | POA: Diagnosis not present

## 2016-09-21 DIAGNOSIS — R3121 Asymptomatic microscopic hematuria: Secondary | ICD-10-CM | POA: Diagnosis not present

## 2016-09-21 DIAGNOSIS — L723 Sebaceous cyst: Secondary | ICD-10-CM | POA: Diagnosis not present

## 2016-09-21 DIAGNOSIS — N200 Calculus of kidney: Secondary | ICD-10-CM | POA: Diagnosis not present

## 2016-09-21 DIAGNOSIS — D225 Melanocytic nevi of trunk: Secondary | ICD-10-CM | POA: Diagnosis not present

## 2016-09-22 ENCOUNTER — Encounter: Payer: Self-pay | Admitting: Cardiology

## 2016-09-26 ENCOUNTER — Other Ambulatory Visit: Payer: Self-pay | Admitting: Family Medicine

## 2016-09-26 DIAGNOSIS — N200 Calculus of kidney: Secondary | ICD-10-CM | POA: Diagnosis not present

## 2016-09-26 DIAGNOSIS — R911 Solitary pulmonary nodule: Secondary | ICD-10-CM | POA: Diagnosis not present

## 2016-09-26 DIAGNOSIS — N3281 Overactive bladder: Secondary | ICD-10-CM | POA: Diagnosis not present

## 2016-09-26 DIAGNOSIS — R3121 Asymptomatic microscopic hematuria: Secondary | ICD-10-CM | POA: Diagnosis not present

## 2016-09-26 DIAGNOSIS — N281 Cyst of kidney, acquired: Secondary | ICD-10-CM | POA: Diagnosis not present

## 2016-09-28 DIAGNOSIS — L723 Sebaceous cyst: Secondary | ICD-10-CM | POA: Diagnosis not present

## 2016-09-29 ENCOUNTER — Ambulatory Visit (INDEPENDENT_AMBULATORY_CARE_PROVIDER_SITE_OTHER): Payer: Medicare Other | Admitting: Cardiology

## 2016-09-29 ENCOUNTER — Encounter: Payer: Self-pay | Admitting: Cardiology

## 2016-09-29 VITALS — BP 130/68 | HR 54 | Ht 61.5 in | Wt 160.0 lb

## 2016-09-29 DIAGNOSIS — R072 Precordial pain: Secondary | ICD-10-CM

## 2016-09-29 DIAGNOSIS — Z8249 Family history of ischemic heart disease and other diseases of the circulatory system: Secondary | ICD-10-CM

## 2016-09-29 DIAGNOSIS — I779 Disorder of arteries and arterioles, unspecified: Secondary | ICD-10-CM

## 2016-09-29 DIAGNOSIS — E7800 Pure hypercholesterolemia, unspecified: Secondary | ICD-10-CM

## 2016-09-29 DIAGNOSIS — R0609 Other forms of dyspnea: Secondary | ICD-10-CM | POA: Diagnosis not present

## 2016-09-29 DIAGNOSIS — I739 Peripheral vascular disease, unspecified: Secondary | ICD-10-CM

## 2016-09-29 DIAGNOSIS — R0600 Dyspnea, unspecified: Secondary | ICD-10-CM

## 2016-09-29 DIAGNOSIS — I10 Essential (primary) hypertension: Secondary | ICD-10-CM

## 2016-09-29 NOTE — Patient Instructions (Signed)
Medication Instructions:   Your physician recommends that you continue on your current medications as directed. Please refer to the Current Medication list given to you today.    Testing/Procedures:  Your physician has requested that you have en exercise stress myoview. For further information please visit [email or web address]. Please follow instruction sheet, as given.     Follow-Up:  Your physician wants you to follow-up in: 6 MONTHS WITH DR NELSON You will receive a reminder letter in the mail two months in advance. If you don't receive a letter, please call our office to schedule the follow-up appointment.        If you need a refill on your cardiac medications before your next appointment, please call your pharmacy.   

## 2016-09-29 NOTE — Progress Notes (Signed)
Cardiology Office Note    Date:  09/29/2016   ID:  Felicia Kelly, DOB 26-Aug-1936, MRN [id number]  PCP:  [name]., DO  Cardiologist:  Ena Dawley, MD   Chief complain: Dyspnea on exertion  History of Present Illness:  Felicia Kelly is a 80 y.o. female who is a very pleasant patient who has a history of carotid disease, hypertension, hyperlipidemia treated with pravastatin who is coming with concern of worsening dyspnea on exertion in the settings of significant family history of coronary artery disease. Her brother died of sudden cardiac death in the settings of myocardial infarction and stroke just a months ago. Her sister was also my patient had myocardial infarction at the age of 41. The patient moved here from [place] 3 years ago where she underwent bilateral hip replacement and hasn't been active ever since. She had carotid ultrasound for the purposes of life insurance performed that showed mild left carotid disease. The patient's denies any chest pain, no palpitations or syncope. She admits that she is been eating a lot of fast food. She is a member of a gym but doesn't go too frequently. She has been compliant with her medicines and has no side effects. She denies any lower extremity edema claudications or proximal nocturnal dyspnea. She is currently being treated for shingles.  Past Medical History:  Diagnosis Date  . Arthritis   . Colonic polyp   . Genital herpes   . Hyperlipidemia   . Hypertension   . Osteoporosis   . Thyroid disease    seeing Dr. Buddy Duty  . Tremor     Past Surgical History:  Procedure Laterality Date  . ABDOMINAL HYSTERECTOMY  1988   no cancer - reports total with bilat oophorectomy  . APPENDECTOMY  1988  . BREAST SURGERY  1985   biopsy  . FOOT SURGERY    . TOTAL HIP ARTHROPLASTY      Current Medications: Outpatient Medications Prior to Visit  Medication Sig Dispense Refill  . ascorbic acid (VITAMIN C) 500 MG tablet Take 500 mg by mouth  daily.    Marland Kitchen aspirin 81 MG tablet Take 81 mg by mouth daily.    Marland Kitchen atenolol (TENORMIN) 25 MG tablet TAKE 1/2 TABLETS (12.5 MG TOTAL) BY MOUTH DAILY. 45 tablet 1  . benzonatate (TESSALON) 100 MG capsule Take 1 capsule (100 mg total) by mouth 2 (two) times daily as needed for cough. 20 capsule 0  . levothyroxine (SYNTHROID, LEVOTHROID) 50 MCG tablet Take 50 mcg by mouth daily before breakfast.    . Multiple Vitamin (MULTIVITAMIN) capsule Take 1 capsule by mouth daily.    . pravastatin (PRAVACHOL) 80 MG tablet Take 1 tablet (80 mg total) by mouth daily. 90 tablet 3  . valACYclovir (VALTREX) 500 MG tablet TAKE 1 TABLET (500 MG TOTAL) BY MOUTH DAILY. 90 tablet 1  . valsartan-hydrochlorothiazide (DIOVAN-HCT) 160-12.5 MG tablet Take 0.5 tablets by mouth daily. 45 tablet 3   No facility-administered medications prior to visit.      Allergies:   Bactrim [sulfamethoxazole-trimethoprim]; Celebrex [celecoxib]; Crestor [rosuvastatin]; Lipitor [atorvastatin]; Niaspan [niacin er]; Tetracyclines & related; Zocor [simvastatin]; and Penicillins   Social History   Social History  . Marital status: Widowed    Spouse name: N/A  . Number of children: N/A  . Years of education: N/A   Social History Main Topics  . Smoking status: Former Research scientist (life sciences)  . Smokeless tobacco: Never Used  . Alcohol use No  . Drug use: No  .  Sexual activity: Not Asked   Other Topics Concern  . None   Social History Narrative   Work or School: none      Home Situation: widower, lives alone      Spiritual Beliefs: Christian      Lifestyle: no regular exercise; diet is ok          Family History:  The patient's family history includes Arthritis in her mother; Cancer in her father; Diabetes in her mother; Felicia attack (age of onset: 68) in her sister; Felicia disease in her father and mother; Hyperlipidemia in her mother; Hypertension in her mother; Liver disease in her brother; Pancreatic cancer in her father.   ROS:   Please see  the history of present illness.    ROS All other systems reviewed and are negative.   PHYSICAL EXAM:   VS:  BP 130/68   Pulse (!) 54   Ht 5' 1.5" (1.562 m)   Wt 160 lb (72.6 kg)   BMI 29.74 kg/m    GEN: Well nourished, well developed, in no acute distress  HEENT: normal  Neck: no JVD, carotid bruits, or masses Cardiac: RRR; no murmurs, rubs, or gallops,no edema  Respiratory:  clear to auscultation bilaterally, normal work of breathing GI: soft, nontender, nondistended, + BS MS: no deformity or atrophy  Skin: warm and dry, no rash Neuro:  Alert and Oriented x 3, Strength and sensation are intact Psych: euthymic mood, full affect  Wt Readings from Last 3 Encounters:  09/29/16 160 lb (72.6 kg)  09/01/16 159 lb (72.1 kg)  07/11/16 159 lb 12.8 oz (72.5 kg)    Studies/Labs Reviewed:   EKG:  EKG is ordered today.  The ekg ordered today demonstrates Sinus bradycardia with PACs.  Recent Labs: 07/11/2016: BUN 17; Creatinine, Ser 0.84; Hemoglobin 13.2; Platelets 277.0; Potassium 4.1; Sodium 143; TSH 1.53   Lipid Panel    Component Value Date/Time   CHOL 166 11/15/2015 1209   TRIG 107.0 11/15/2015 1209   HDL 47.00 11/15/2015 1209   CHOLHDL 4 11/15/2015 1209   VLDL 21.4 11/15/2015 1209   LDLCALC 98 11/15/2015 1209      ASSESSMENT:    1. DOE (dyspnea on exertion)   2. Pure hypercholesterolemia   3. Essential hypertension   4. Family history of early CAD   31. Precordial pain   6. Left-sided carotid artery disease ([place])      PLAN:  In order of problems listed above:  1. The patient has a very significant family history of premature coronary artery disease with both of her siblings having Felicia attacks and brother dying of sudden cardiac death. We will continue aspirin pravastatin, atenolol and valsartan. We'll perform an exercise nuclear stress test to evaluate for possible ischemia. Considering her brother had no warning signs if her stress test is nonconclusive will  consider coronary CT for further evaluation. 2. If her stress test is normal she is encouraged to start regular exercise at her gym and I'll turn her diet. 3. Her blood pressure is well controlled 4. Lipids are all at goal, continue pravastatin 5. Carotid disease, we will repeat ultrasound in a year.    Medication Adjustments/Labs and Tests Ordered: Current medicines are reviewed at length with the patient today.  Concerns regarding medicines are outlined above.  Medication changes, Labs and Tests ordered today are listed in the Patient Instructions below. Patient Instructions  Medication Instructions:   Your physician recommends that you continue on your current medications as  directed. Please refer to the Current Medication list given to you today.    Testing/Procedures:  Your physician has requested that you have en exercise stress myoview. For further information please visit [email or web address]. Please follow instruction sheet, as given.    Follow-Up:  Your physician wants you to follow-up in: [place] will receive a reminder letter in the mail two months in advance. If you don't receive a letter, please call our office to schedule the follow-up appointment.        If you need a refill on your cardiac medications before your next appointment, please call your pharmacy.      Signed, Ena Dawley, MD  09/29/2016 10:58 AM    [address], [place], [place]  [address] Phone: [phone]; Fax: [phone]

## 2016-10-02 ENCOUNTER — Telehealth (HOSPITAL_COMMUNITY): Payer: Self-pay | Admitting: *Deleted

## 2016-10-02 NOTE — Telephone Encounter (Signed)
Patient given detailed instructions per Myocardial Perfusion Study Information Sheet for the test on 10/04/16 at 0730. Patient notified to arrive 15 minutes early and that it is imperative to arrive on time for appointment to keep from having the test rescheduled.  If you need to cancel or reschedule your appointment, please call the office within 24 hours of your appointment. Failure to do so may result in a cancellation of your appointment, and a $50 no show fee. Patient verbalized understanding.Skye Plamondon, Ranae Palms

## 2016-10-04 ENCOUNTER — Ambulatory Visit (HOSPITAL_COMMUNITY): Payer: Medicare Other | Attending: Internal Medicine

## 2016-10-04 DIAGNOSIS — Z8249 Family history of ischemic heart disease and other diseases of the circulatory system: Secondary | ICD-10-CM | POA: Diagnosis not present

## 2016-10-04 DIAGNOSIS — E7800 Pure hypercholesterolemia, unspecified: Secondary | ICD-10-CM | POA: Diagnosis not present

## 2016-10-04 DIAGNOSIS — R0609 Other forms of dyspnea: Secondary | ICD-10-CM | POA: Diagnosis not present

## 2016-10-04 DIAGNOSIS — R0600 Dyspnea, unspecified: Secondary | ICD-10-CM

## 2016-10-04 DIAGNOSIS — I10 Essential (primary) hypertension: Secondary | ICD-10-CM | POA: Diagnosis not present

## 2016-10-04 DIAGNOSIS — R072 Precordial pain: Secondary | ICD-10-CM | POA: Diagnosis not present

## 2016-10-04 LAB — MYOCARDIAL PERFUSION IMAGING
Estimated workload: 7 METS
Exercise duration (min): 5 min
Exercise duration (sec): 0 s
LV dias vol: 72 mL (ref 46–106)
LV sys vol: 28 mL
MPHR: 142 {beats}/min
Peak HR: 146 {beats}/min
Percent HR: 102 %
RATE: 0.3
Rest HR: 55 {beats}/min
SDS: 1
SRS: 0
SSS: 1
TID: 0.9

## 2016-10-04 MED ORDER — TECHNETIUM TC 99M TETROFOSMIN IV KIT
10.6000 | PACK | Freq: Once | INTRAVENOUS | Status: AC | PRN
  Administered 2016-10-04: 10.6 via INTRAVENOUS
  Filled 2016-10-04: qty 11

## 2016-10-04 MED ORDER — TECHNETIUM TC 99M TETROFOSMIN IV KIT
31.8000 | PACK | Freq: Once | INTRAVENOUS | Status: AC | PRN
  Administered 2016-10-04: 31.8 via INTRAVENOUS
  Filled 2016-10-04: qty 32

## 2016-10-10 MED ORDER — LEVOTHYROXINE 50 MCG TAB
50 mcg | ORAL_TABLET | ORAL | 1 refills | Status: AC
Start: 2016-10-10 — End: ?

## 2016-10-23 ENCOUNTER — Telehealth: Payer: Self-pay | Admitting: *Deleted

## 2016-10-23 DIAGNOSIS — R911 Solitary pulmonary nodule: Secondary | ICD-10-CM | POA: Insufficient documentation

## 2016-10-23 NOTE — Addendum Note (Signed)
Addended by: Agnes Lawrence on: 10/23/2016 01:40 PM   Modules accepted: Orders

## 2016-10-23 NOTE — Telephone Encounter (Signed)
Pt calling stating Dr. Maudie Kelly can go ahead and schedule her for the CT in December of this year with the Dr. She was referring her to.

## 2016-10-23 NOTE — Telephone Encounter (Signed)
Dr Maudie Mercury received office notes from Dr Karsten Ro in regards to a CT that revealed a pulmonary nodule and he recommended Dr Maudie Mercury follow the pt on this.  Dr Maudie Mercury stated to set up a physical exam for the pt and let her know she would need a CT of the lung in 1 year and to order this.  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  I advised her I am not sure if we can order a repeat at his office and I offered to call to see about this.  She stated she would call his office and ask that they order this and have it done there as well.  CPE scheduled for 4/26 as the office visit in 12/2016 was for a follow up only.  Message forwarded to Dr Maudie Mercury as Juluis Rainier.

## 2016-10-23 NOTE — Telephone Encounter (Signed)
We would not be referring her to a doctor. But, ok to order CT lung in 1 year to follow up on pulm nodule. Thanks!

## 2016-10-23 NOTE — Telephone Encounter (Signed)
Orders entered

## 2016-11-08 ENCOUNTER — Telehealth: Payer: Self-pay | Admitting: Family Medicine

## 2016-11-08 NOTE — Telephone Encounter (Addendum)
Pt said dr Karsten Ro said she needs chest ct scan next year not this year. Pt would like jo ann to return her call tomorrow

## 2016-11-09 NOTE — Telephone Encounter (Signed)
She needed a repeat CT in 1 year per prior note. See prior note. I am not sure that I understand this message.

## 2016-11-10 NOTE — Telephone Encounter (Signed)
I called the pt and she stated [facility] called to make the appt for the CT now and she thought she needed this in 1 year.  I called [facility] and spoke with Juliann Pulse and she stated she will note this on the pts chart and they will call for an appt in December and Ms Sepich is aware of this.

## 2016-11-20 ENCOUNTER — Other Ambulatory Visit: Payer: Self-pay | Admitting: Family Medicine

## 2016-11-20 DIAGNOSIS — Z1231 Encounter for screening mammogram for malignant neoplasm of breast: Secondary | ICD-10-CM

## 2016-11-29 DIAGNOSIS — E890 Postprocedural hypothyroidism: Secondary | ICD-10-CM | POA: Diagnosis not present

## 2016-12-12 ENCOUNTER — Other Ambulatory Visit: Payer: Self-pay | Admitting: Family Medicine

## 2016-12-12 DIAGNOSIS — E0510 Thyrotoxicosis with toxic single thyroid nodule without thyrotoxic crisis or storm: Secondary | ICD-10-CM | POA: Diagnosis not present

## 2016-12-12 DIAGNOSIS — E890 Postprocedural hypothyroidism: Secondary | ICD-10-CM | POA: Diagnosis not present

## 2017-01-08 ENCOUNTER — Other Ambulatory Visit: Payer: Self-pay | Admitting: Family Medicine

## 2017-01-09 ENCOUNTER — Ambulatory Visit: Payer: Medicare Other | Admitting: Family Medicine

## 2017-02-07 NOTE — Progress Notes (Signed)
Medicare Annual Preventive Care Visit  (initial annual wellness or annual wellness exam)  Concerns and/or follow up today: PMH HTN, HLD, Tremors, Hypothyroidism (sees Dr. Buddy Duty), HLD, OSteoporosis, Genital herpes, pulm nodule, s/p hysterectomy here for her AWV. She had shingles earlier this year and has question about the new shingles vaccine. She has questions about significance of pulm nodules. No symptoms. Very remote smoking histrory. Due for labs, mammo in sept, CT nodule lung dec,  See HM section in Epic for other details of completed HM. See scanned documentation under Media Tab for further documentation HPI, health risk assessment. See Media Tab and Care Teams sections in Epic for other providers.  ROS: negative for report of fevers, unintentional weight loss, vision changes, vision loss, hearing loss or change, chest pain, sob, hemoptysis, melena, hematochezia, hematuria, genital discharge or lesions, falls, bleeding or bruising, loc, thoughts of suicide or self harm, memory loss  1.) Patient-completed health risk assessment  - completed and reviewed, see scanned documentation  2.) Review of Medical History: -PMH, PSH, Family History and current specialty and care providers reviewed and updated and listed below  - see scanned in document in chart and below  Past Medical History:  Diagnosis Date  . Arthritis   . Colonic polyp   . Genital herpes   . Hyperlipidemia   . Hypertension   . Osteoporosis   . Thyroid disease    seeing Dr. Buddy Duty  . Tremor     Past Surgical History:  Procedure Laterality Date  . ABDOMINAL HYSTERECTOMY  1988   no cancer - reports total with bilat oophorectomy  . APPENDECTOMY  1988  . BREAST SURGERY  1985   biopsy  . FOOT SURGERY    . TOTAL HIP ARTHROPLASTY      Social History   Social History  . Marital status: Widowed    Spouse name: N/A  . Number of children: N/A  . Years of education: N/A   Occupational History  . Not on file.    Social History Main Topics  . Smoking status: Former Research scientist (life sciences)  . Smokeless tobacco: Never Used  . Alcohol use No  . Drug use: No  . Sexual activity: Not on file   Other Topics Concern  . Not on file   Social History Narrative   Work or School: none      Home Situation: widower, lives alone      Spiritual Beliefs: Christian      Lifestyle: no regular exercise; diet is ok          Family History  Problem Relation Age of Onset  . Cancer Father     pancreatic  . Pancreatic cancer Father   . Heart disease Father   . Heart attack Sister 30  . Arthritis Mother   . Hyperlipidemia Mother   . Hypertension Mother   . Heart disease Mother   . Diabetes Mother   . Liver disease Brother     Current Outpatient Prescriptions on File Prior to Visit  Medication Sig Dispense Refill  . ascorbic acid (VITAMIN C) 500 MG tablet Take 500 mg by mouth daily.    Marland Kitchen aspirin 81 MG tablet Take 81 mg by mouth daily.    Marland Kitchen atenolol (TENORMIN) 25 MG tablet TAKE 1/2 TABLETS (12.5 MG TOTAL) BY MOUTH DAILY. 45 tablet 1  . benzonatate (TESSALON) 100 MG capsule Take 1 capsule (100 mg total) by mouth 2 (two) times daily as needed for cough. 20 capsule 0  .  levothyroxine (SYNTHROID, LEVOTHROID) 50 MCG tablet Take 50 mcg by mouth daily before breakfast.    . Multiple Vitamin (MULTIVITAMIN) capsule Take 1 capsule by mouth daily.    . pravastatin (PRAVACHOL) 80 MG tablet Take 1 tablet (80 mg total) by mouth daily. 90 tablet 3  . valACYclovir (VALTREX) 500 MG tablet TAKE 1 TABLET (500 MG TOTAL) BY MOUTH DAILY. 90 tablet 1  . valsartan-hydrochlorothiazide (DIOVAN-HCT) 160-12.5 MG tablet TAKE 1/2 TABLETS BY MOUTH DAILY. 45 tablet 2   No current facility-administered medications on file prior to visit.      3.) Review of functional ability and level of safety:  Any difficulty hearing?  See scanned documentation  History of falling?  See scanned documentation  Any trouble with IADLs - using a phone,  using transportation, grocery shopping, preparing meals, doing housework, doing laundry, taking medications and managing money?  See scanned documentation  Advance Directives?  Done, copy requested See summary of recommendations in Patient Instructions below.  4.) Physical Exam Vitals:   02/08/17 0909  BP: 120/80  Pulse: (!) 54  Temp: 97.9 F (36.6 C)   Estimated body mass index is 28.98 kg/m as calculated from the following:   Height as of this encounter: 5' 1.5" (1.562 m).   Weight as of this encounter: 155 lb 14.4 oz (70.7 kg).  EKG (optional): deferred  General: alert, appear well hydrated and in no acute distress  HEENT: visual acuity grossly intact  CV: HRRR  Lungs: CTA bilaterally  Psych: pleasant and cooperative, no obvious depression or anxiety  Cognitive function grossly intact  See patient instructions for recommendations.  Education and counseling regarding the above review of health provided with a plan for the following: -see scanned patient completed form for further details -fall prevention strategies discussed  -healthy lifestyle discussed -importance and resources for completing advanced directives discussed -see patient instructions below for any other recommendations provided  4)The following written screening schedule of preventive measures were reviewed with assessment and plan made per below, orders and patient instructions:      AAA screening done if applicable     Alcohol screening done     Obesity Screening and counseling done     STI screening (Hep C if born 62-65) offered and per pt wishes     Tobacco Screening done      Pneumococcal (PPSV23 -one dose after 64, one before if risk factors), influenza yearly and hepatitis B vaccines (if high risk - end stage renal disease, IV drugs, homosexual men, live in home for mentally retarded, hemophilia receiving factors) ASSESSMENT/PLAN: done      Screening mammograph (yearly if  >40) ASSESSMENT/PLAN: utd or ordered      Screening Pap smear/pelvic exam (q2 years) ASSESSMENT/PLAN: n/a, declined      Colorectal cancer screening (FOBT yearly or flex sig q4y or colonoscopy q10y or barium enema q4y) ASSESSMENT/PLAN: utd       Diabetes outpatient self-management training services ASSESSMENT/PLAN: utd or done      Bone mass measurements(covered q2y if indicated - estrogen def, osteoporosis, hyperparathyroid, vertebral abnormalities, osteoporosis or steroids) ASSESSMENT/PLAN: utd       Screening for glaucoma(q1y if high risk - diabetes, FH, AA and > 50 or hispanic and > 65) ASSESSMENT/PLAN: utd or advised      Medical nutritional therapy for individuals with diabetes or renal disease ASSESSMENT/PLAN: see orders      Cardiovascular screening blood tests (lipids q5y) ASSESSMENT/PLAN: see orders and labs  Diabetes screening tests ASSESSMENT/PLAN: see orders and labs   7.) Summary: -risk factors and conditions per above assessment were discussed and treatment, recommendations and referrals were offered per documentation above and orders and patient instructions.  Medicare annual wellness visit, initial  Pure hypercholesterolemia - Plan: Lipid panel  Hyperglycemia - Plan: Hemoglobin A1c  Solitary pulmonary nodule  Osteopenia, unspecified location  Essential hypertension - Plan: Basic metabolic panel, CBC  Patient Instructions   BEFORE YOU LEAVE: -labs -follow up: 3-4 months  We have ordered labs or studies at this visit. It can take up to 1-2 weeks for results and processing. IF results require follow up or explanation, we will call you with instructions. Clinically stable results will be released to your [facility]. If you have not heard from [place] or cannot find your results in [facility] in 2 weeks please contact our office at [phone].  If you are not yet signed up for [facility], please consider signing up.   [name] , Thank you for taking time to  come for your Medicare Wellness Visit. I appreciate your ongoing commitment to your health goals. Please review the following plan we discussed and let me know if I can assist you in the future.   These are the goals we discussed: Goals    Increase gentle aerobic exercise - consider water aerobics. Healthy diet. Vit D and weight bearing exercise for bone health. CT in December for pulmonary nodule. Mammogram this fall. Labs today.      This is a list of the screening recommended for you and due dates:  Health Maintenance  Topic Date Due  . Flu Shot  05/16/2017  . Tetanus Vaccine  10/04/2023  . DEXA scan (bone density measurement)  Completed - repeat in 2019  . Pneumonia vaccines  Completed     We recommend the following healthy lifestyle for LIFE: 1) Small portions.   Tip: eat off of a salad plate instead of a dinner plate.  Tip: if you need more or a snack choose fruits, veggies and/or a handful of nuts or seeds.  2) Eat a healthy clean diet.  * Tip: Avoid (less then 1 serving per week): processed foods, sweets, sweetened drinks, white starches (rice, flour, bread, potatoes, pasta, etc), red meat, fast foods, butter  *Tip: CHOOSE instead   * 5-9 servings per day of fresh or frozen fruits and vegetables (but not corn, potatoes, bananas, canned or dried fruit)   *nuts and seeds, beans   *olives and olive oil   *small portions of lean meats such as fish and white chicken    *small portions of whole grains  3)Get at least 150 minutes of sweaty aerobic exercise per week.  4)Reduce stress - consider counseling, meditation and relaxation to balance other aspects of your life.        Colin Benton R., DO

## 2017-02-08 ENCOUNTER — Ambulatory Visit (INDEPENDENT_AMBULATORY_CARE_PROVIDER_SITE_OTHER): Payer: Medicare Other | Admitting: Family Medicine

## 2017-02-08 ENCOUNTER — Encounter: Payer: Self-pay | Admitting: Family Medicine

## 2017-02-08 VITALS — BP 120/80 | HR 54 | Temp 97.9°F | Ht 61.5 in | Wt 155.9 lb

## 2017-02-08 DIAGNOSIS — R739 Hyperglycemia, unspecified: Secondary | ICD-10-CM

## 2017-02-08 DIAGNOSIS — M8580 Other specified disorders of bone density and structure, unspecified site: Secondary | ICD-10-CM

## 2017-02-08 DIAGNOSIS — R911 Solitary pulmonary nodule: Secondary | ICD-10-CM | POA: Diagnosis not present

## 2017-02-08 DIAGNOSIS — E7800 Pure hypercholesterolemia, unspecified: Secondary | ICD-10-CM

## 2017-02-08 DIAGNOSIS — I10 Essential (primary) hypertension: Secondary | ICD-10-CM | POA: Diagnosis not present

## 2017-02-08 DIAGNOSIS — Z0000 Encounter for general adult medical examination without abnormal findings: Secondary | ICD-10-CM

## 2017-02-08 LAB — CBC
HCT: 40.9 % (ref 36.0–46.0)
Hemoglobin: 13.6 g/dL (ref 12.0–15.0)
MCHC: 33.3 g/dL (ref 30.0–36.0)
MCV: 90.1 fl (ref 78.0–100.0)
Platelets: 271 10*3/uL (ref 150.0–400.0)
RBC: 4.54 Mil/uL (ref 3.87–5.11)
RDW: 13.8 % (ref 11.5–15.5)
WBC: 8.1 10*3/uL (ref 4.0–10.5)

## 2017-02-08 LAB — BASIC METABOLIC PANEL
BUN: 22 mg/dL (ref 6–23)
CO2: 28 mEq/L (ref 19–32)
Calcium: 9.7 mg/dL (ref 8.4–10.5)
Chloride: 106 mEq/L (ref 96–112)
Creatinine, Ser: 0.77 mg/dL (ref 0.40–1.20)
GFR: 76.85 mL/min (ref >60.00)
Glucose, Bld: 102 mg/dL — ABNORMAL HIGH (ref 70–99)
Potassium: 4 mEq/L (ref 3.5–5.1)
Sodium: 142 mEq/L (ref 135–145)

## 2017-02-08 LAB — LIPID PANEL
Cholesterol: 200 mg/dL (ref 0–200)
HDL: 49 mg/dL (ref >39.00)
LDL Cholesterol: 121 mg/dL — ABNORMAL HIGH (ref 0–99)
NonHDL: 151.11
Total CHOL/HDL Ratio: 4
Triglycerides: 152 mg/dL — ABNORMAL HIGH (ref 0.0–149.0)
VLDL: 30.4 mg/dL (ref 0.0–40.0)

## 2017-02-08 LAB — HEMOGLOBIN A1C: Hgb A1c MFr Bld: 6.1 % (ref 4.6–6.5)

## 2017-02-08 NOTE — Progress Notes (Signed)
Pre visit review using our clinic review tool, if applicable. No additional management support is needed unless otherwise documented below in the visit note. 

## 2017-02-08 NOTE — Patient Instructions (Signed)
BEFORE YOU LEAVE: -labs -follow up: 3-4 months  We have ordered labs or studies at this visit. It can take up to 1-2 weeks for results and processing. IF results require follow up or explanation, we will call you with instructions. Clinically stable results will be released to your [facility]. If you have not heard from [place] or cannot find your results in [facility] in 2 weeks please contact our office at [phone].  If you are not yet signed up for [facility], please consider signing up.   Felicia Kelly , Thank you for taking time to come for your Medicare Wellness Visit. I appreciate your ongoing commitment to your health goals. Please review the following plan we discussed and let me know if I can assist you in the future.   These are the goals we discussed: Goals    Increase gentle aerobic exercise - consider water aerobics. Healthy diet. Vit D and weight bearing exercise for bone health. CT in December for pulmonary nodule. Mammogram this fall. Labs today.      This is a list of the screening recommended for you and due dates:  Health Maintenance  Topic Date Due  . Flu Shot  05/16/2017  . Tetanus Vaccine  10/04/2023  . DEXA scan (bone density measurement)  Completed - repeat in 2019  . Pneumonia vaccines  Completed     We recommend the following healthy lifestyle for LIFE: 1) Small portions.   Tip: eat off of a salad plate instead of a dinner plate.  Tip: if you need more or a snack choose fruits, veggies and/or a handful of nuts or seeds.  2) Eat a healthy clean diet.  * Tip: Avoid (less then 1 serving per week): processed foods, sweets, sweetened drinks, white starches (rice, flour, bread, potatoes, pasta, etc), red meat, fast foods, butter  *Tip: CHOOSE instead   * 5-9 servings per day of fresh or frozen fruits and vegetables (but not corn, potatoes, bananas, canned or dried fruit)   *nuts and seeds, beans   *olives and olive oil   *small portions of lean meats such as fish and  white chicken    *small portions of whole grains  3)Get at least 150 minutes of sweaty aerobic exercise per week.  4)Reduce stress - consider counseling, meditation and relaxation to balance other aspects of your life.

## 2017-03-28 ENCOUNTER — Other Ambulatory Visit: Payer: Self-pay | Admitting: Family Medicine

## 2017-04-05 DIAGNOSIS — L821 Other seborrheic keratosis: Secondary | ICD-10-CM | POA: Diagnosis not present

## 2017-04-05 DIAGNOSIS — Z1283 Encounter for screening for malignant neoplasm of skin: Secondary | ICD-10-CM | POA: Diagnosis not present

## 2017-04-06 NOTE — Telephone Encounter (Signed)
Kathryn [name],  [name].      Please call her and see if she can have levothyroxine refilled in future by her PCP in [place], unless she is planning on seeing me again (no follow-up currently scheduled).   Thank you  Tammy SoursGreg

## 2017-04-06 NOTE — Telephone Encounter (Signed)
Left message to call back.

## 2017-05-15 DIAGNOSIS — M25511 Pain in right shoulder: Secondary | ICD-10-CM | POA: Diagnosis not present

## 2017-05-28 NOTE — Progress Notes (Signed)
HPI:  Felicia Kelly is a pleasant 81 y.o. here for follow up. Chronic medical problems summarized below were reviewed for changes and stability. She reports she is doing quite well. She has been walking every day in the evening for about a mile and she has really enjoyed this. She did not know about valsartan recall, but after discussion she would like to change his medication. She only takes a half pill. Denies CP, SOB, DOE, treatment intolerance or new symptoms. Do for: Change to losartan, labs, flu shot in October Annual wellness visit 4/18  Hypertension, hyperlipidemia, BMI 28: -Medications include: Aspirin 81 mg, atenolol 12.5 mg twice a day, valsartan-hydrochlorothiazide and pravastatin  Hypothyroidism: -Sees endocrinologist for management, Dr. Buddy Duty -On levothyroxine  Osteoporosis: -Doing vitamin D and weightbearing exercise -Last DEXA: 02/2016  Genital herpes: -Suppressive therapy with Valtrex  History of pulmonary nodule: -Repeat CT scheduled for this December  ROS: See pertinent positives and negatives per HPI.  Past Medical History:  Diagnosis Date  . Arthritis   . Colonic polyp   . Genital herpes   . Hyperlipidemia   . Hypertension   . Osteoporosis   . Thyroid disease    seeing Dr. Buddy Duty  . Tremor     Past Surgical History:  Procedure Laterality Date  . ABDOMINAL HYSTERECTOMY  1988   no cancer - reports total with bilat oophorectomy  . APPENDECTOMY  1988  . BREAST SURGERY  1985   biopsy  . FOOT SURGERY    . TOTAL HIP ARTHROPLASTY      Family History  Problem Relation Age of Onset  . Cancer Father        pancreatic  . Pancreatic cancer Father   . Heart disease Father   . Heart attack Sister 73  . Arthritis Mother   . Hyperlipidemia Mother   . Hypertension Mother   . Heart disease Mother   . Diabetes Mother   . Liver disease Brother     Social History   Social History  . Marital status: Widowed    Spouse name: N/A  . Number of children:  N/A  . Years of education: N/A   Social History Main Topics  . Smoking status: Former Research scientist (life sciences)  . Smokeless tobacco: Never Used  . Alcohol use No  . Drug use: No  . Sexual activity: Not Asked   Other Topics Concern  . None   Social History Narrative   Work or School: none      Home Situation: widower, lives alone      Spiritual Beliefs: Christian      Lifestyle: no regular exercise; diet is ok           Current Outpatient Prescriptions:  .  ascorbic acid (VITAMIN C) 500 MG tablet, Take 500 mg by mouth daily., Disp: , Rfl:  .  aspirin 81 MG tablet, Take 81 mg by mouth daily., Disp: , Rfl:  .  atenolol (TENORMIN) 25 MG tablet, TAKE 1/2 TABLETS (12.5 MG TOTAL) BY MOUTH DAILY., Disp: 45 tablet, Rfl: 5 .  benzonatate (TESSALON) 100 MG capsule, Take 1 capsule (100 mg total) by mouth 2 (two) times daily as needed for cough., Disp: 20 capsule, Rfl: 0 .  levothyroxine (SYNTHROID, LEVOTHROID) 50 MCG tablet, Take 50 mcg by mouth daily before breakfast., Disp: , Rfl:  .  Multiple Vitamin (MULTIVITAMIN) capsule, Take 1 capsule by mouth daily., Disp: , Rfl:  .  pravastatin (PRAVACHOL) 80 MG tablet, Take 1 tablet (80 mg total) by  mouth daily., Disp: 90 tablet, Rfl: 3 .  valACYclovir (VALTREX) 500 MG tablet, TAKE 1 TABLET (500 MG TOTAL) BY MOUTH DAILY., Disp: 90 tablet, Rfl: 1 .  valsartan-hydrochlorothiazide (DIOVAN-HCT) 160-12.5 MG tablet, TAKE 1/2 TABLETS BY MOUTH DAILY., Disp: 45 tablet, Rfl: 2  EXAM:  Vitals:   05/29/17 0903  BP: 116/70  Pulse: (!) 54    Body mass index is 28.94 kg/m.  GENERAL: vitals reviewed and listed above, alert, oriented, appears well hydrated and in no acute distress  HEENT: atraumatic, conjunttiva clear, no obvious abnormalities on inspection of external nose and ears  NECK: no obvious masses on inspection  LUNGS: clear to auscultation bilaterally, no wheezes, rales or rhonchi, good air movement  CV: HRRR, no peripheral edema  MS: moves all  extremities without noticeable abnormality  PSYCH: pleasant and cooperative, no obvious depression or anxiety  ASSESSMENT AND PLAN:  Discussed the following assessment and plan:  Secondary hypertension - Plan: Basic metabolic panel, CBC  Pure hypercholesterolemia  Other specified hypothyroidism  -Stopped the Diovan -Discussed options and she prefers to try hydrochlorothiazide alone at 12.5 mg, as she was taking a half of the low dose of Diovan -Encouraged lifelong commitment to regular aerobic exercise and dieting congratulated on the walking -Labs today -Follow up 1 month advised as we changed her medication, but she would like to monitor blood pressure at home with follow-up in 1 month only if needed; otherwise she plans to follow up in 3-4 months -Patient advised to return or notify a doctor immediately if symptoms worsen or persist or new concerns arise.  Patient Instructions  BEFORE YOU LEAVE: -follow up: In 3-4 months -Labs  Please stop the combination blood pressure medicine called Diovan(valsartan-hydrochlorothiazide).  Please start the new prescription that I sent an of hydrochlorothiazide 12.5. Take a full tablet every morning.  Please keep an eye on your blood pressure and follow up in 4 weeks unless everything is going quite well.  We have ordered labs or studies at this visit. It can take up to 1-2 weeks for results and processing. IF results require follow up or explanation, we will call you with instructions. Clinically stable results will be released to your [facility]. If you have not heard from [place] or cannot find your results in [facility] in 2 weeks please contact our office at [phone].  If you are not yet signed up for [name], please consider signing up.  Advise regular aerobic exercise (at least 150 minutes per week of sweaty exercise) and a healthy diet. Try to eat at least 5-9 servings of vegetables and fruits per day (not corn, potatoes or bananas.) Avoid  sweets, red meat, pork, butter, fried foods, fast food, processed food, excessive dairy, eggs and coconut. Replace bad fats with good fats - fish, nuts and seeds, canola oil, olive oil.   WE NOW OFFER   [facility] FAST TRACK!!!  SAME DAY Appointments for ACUTE CARE  Such as: Sprains, Injuries, cuts, abrasions, rashes, muscle pain, joint pain, back pain Colds, flu, sore throats, headache, allergies, cough, fever  Ear pain, sinus and eye infections Abdominal pain, nausea, vomiting, diarrhea, upset stomach Animal/insect bites  3 Easy Ways to Schedule: Walk-In Scheduling Call in scheduling Mychart Sign-up: [email or web address]                 Colin Benton R., DO

## 2017-05-29 ENCOUNTER — Ambulatory Visit (INDEPENDENT_AMBULATORY_CARE_PROVIDER_SITE_OTHER): Payer: Medicare Other | Admitting: Family Medicine

## 2017-05-29 ENCOUNTER — Encounter: Payer: Self-pay | Admitting: Family Medicine

## 2017-05-29 ENCOUNTER — Telehealth: Payer: Self-pay | Admitting: Family Medicine

## 2017-05-29 VITALS — BP 116/70 | HR 54 | Temp 98.3°F | Ht 61.5 in | Wt 155.7 lb

## 2017-05-29 DIAGNOSIS — I159 Secondary hypertension, unspecified: Secondary | ICD-10-CM | POA: Diagnosis not present

## 2017-05-29 DIAGNOSIS — E038 Other specified hypothyroidism: Secondary | ICD-10-CM

## 2017-05-29 DIAGNOSIS — E7800 Pure hypercholesterolemia, unspecified: Secondary | ICD-10-CM | POA: Diagnosis not present

## 2017-05-29 LAB — CBC
HCT: 39.7 % (ref 36.0–46.0)
Hemoglobin: 13.1 g/dL (ref 12.0–15.0)
MCHC: 33 g/dL (ref 30.0–36.0)
MCV: 92.1 fl (ref 78.0–100.0)
Platelets: 254 10*3/uL (ref 150.0–400.0)
RBC: 4.31 Mil/uL (ref 3.87–5.11)
RDW: 14.1 % (ref 11.5–15.5)
WBC: 8.7 10*3/uL (ref 4.0–10.5)

## 2017-05-29 LAB — BASIC METABOLIC PANEL
BUN: 21 mg/dL (ref 6–23)
CO2: 29 mEq/L (ref 19–32)
Calcium: 9.3 mg/dL (ref 8.4–10.5)
Chloride: 106 mEq/L (ref 96–112)
Creatinine, Ser: 0.74 mg/dL (ref 0.40–1.20)
GFR: 80.4 mL/min (ref >60.00)
Glucose, Bld: 80 mg/dL (ref 70–99)
Potassium: 4 mEq/L (ref 3.5–5.1)
Sodium: 142 mEq/L (ref 135–145)

## 2017-05-29 MED ORDER — HYDROCHLOROTHIAZIDE 12.5 MG PO TABS: 12.5000 mg | ORAL_TABLET | Freq: Every day | ORAL | 3 refills | Status: DC

## 2017-05-29 NOTE — Addendum Note (Signed)
Addended by: [name] on: 05/29/2017 03:43 PM   Modules accepted: Orders

## 2017-05-29 NOTE — Telephone Encounter (Signed)
Pt calling to check the status of the new Bp medication that Dr. Maudie Mercury was going to change from valsartan.  Pharm:  [facility] [place] [facility].

## 2017-05-29 NOTE — Telephone Encounter (Signed)
Sent! Please tell her sorry for the delay!

## 2017-05-29 NOTE — Telephone Encounter (Signed)
I called the pt and informed her of the results. 

## 2017-05-29 NOTE — Patient Instructions (Signed)
BEFORE YOU LEAVE: -follow up: In 3-4 months -Labs  Please stop the combination blood pressure medicine called Diovan(valsartan-hydrochlorothiazide).  Please start the new prescription that I sent an of hydrochlorothiazide 12.5. Take a full tablet every morning.  Please keep an eye on your blood pressure and follow up in 4 weeks unless everything is going quite well.  We have ordered labs or studies at this visit. It can take up to 1-2 weeks for results and processing. IF results require follow up or explanation, we will call you with instructions. Clinically stable results will be released to your [facility]. If you have not heard from [place] or cannot find your results in [facility] in 2 weeks please contact our office at [phone].  If you are not yet signed up for [facility], please consider signing up.  Advise regular aerobic exercise (at least 150 minutes per week of sweaty exercise) and a healthy diet. Try to eat at least 5-9 servings of vegetables and fruits per day (not corn, potatoes or bananas.) Avoid sweets, red meat, pork, butter, fried foods, fast food, processed food, excessive dairy, eggs and coconut. Replace bad fats with good fats - fish, nuts and seeds, canola oil, olive oil.   WE NOW OFFER   [place] [facility] FAST TRACK!!!  SAME DAY Appointments for ACUTE CARE  Such as: Sprains, Injuries, cuts, abrasions, rashes, muscle pain, joint pain, back pain Colds, flu, sore throats, headache, allergies, cough, fever  Ear pain, sinus and eye infections Abdominal pain, nausea, vomiting, diarrhea, upset stomach Animal/insect bites  3 Easy Ways to Schedule: Walk-In Scheduling Call in scheduling Mychart Sign-up: [email or web address]

## 2017-05-30 DIAGNOSIS — Z23 Encounter for immunization: Secondary | ICD-10-CM | POA: Diagnosis not present

## 2017-06-15 ENCOUNTER — Encounter (INDEPENDENT_AMBULATORY_CARE_PROVIDER_SITE_OTHER): Payer: Self-pay

## 2017-06-15 ENCOUNTER — Encounter: Payer: Self-pay | Admitting: Cardiology

## 2017-06-15 ENCOUNTER — Ambulatory Visit (INDEPENDENT_AMBULATORY_CARE_PROVIDER_SITE_OTHER): Payer: Medicare Other | Admitting: Cardiology

## 2017-06-15 VITALS — BP 136/72 | HR 61 | Ht 62.0 in | Wt 160.0 lb

## 2017-06-15 DIAGNOSIS — I779 Disorder of arteries and arterioles, unspecified: Secondary | ICD-10-CM

## 2017-06-15 DIAGNOSIS — I10 Essential (primary) hypertension: Secondary | ICD-10-CM | POA: Diagnosis not present

## 2017-06-15 DIAGNOSIS — Z8249 Family history of ischemic heart disease and other diseases of the circulatory system: Secondary | ICD-10-CM | POA: Diagnosis not present

## 2017-06-15 DIAGNOSIS — I739 Peripheral vascular disease, unspecified: Principal | ICD-10-CM

## 2017-06-15 DIAGNOSIS — E782 Mixed hyperlipidemia: Secondary | ICD-10-CM

## 2017-06-15 DIAGNOSIS — R0609 Other forms of dyspnea: Secondary | ICD-10-CM

## 2017-06-15 DIAGNOSIS — R0600 Dyspnea, unspecified: Secondary | ICD-10-CM

## 2017-06-15 MED ORDER — HYDROCHLOROTHIAZIDE 25 MG PO TABS: 25.0000 mg | ORAL_TABLET | Freq: Every day | ORAL | 3 refills | Status: DC

## 2017-06-15 MED ORDER — LOSARTAN POTASSIUM 50 MG PO TABS: 50.0000 mg | ORAL_TABLET | Freq: Every day | ORAL | 3 refills | Status: DC

## 2017-06-15 NOTE — Patient Instructions (Signed)
Medication Instructions:  Your physician has recommended you make the following change in your medication:  1. Discontinue Valsartan due to recall. 2. Start HCTZ (25 MG ) daily. 3. Start Losartan (50 mg ) daily   Labwork: -None  Testing/Procedures: Your physician has requested that you have a carotid duplex. This test is an ultrasound of the carotid arteries in your neck. It looks at blood flow through these arteries that supply the brain with blood. Allow one hour for this exam. There are no restrictions or special instructions. Patient will call office after checking with insurance company to call office to schedule appointment @ [phone].    Follow-Up: Your physician wants you to follow-up in: 1 year with Dr. [name].  You will receive a reminder letter in the mail two months in advance. If you don't receive a letter, please call our office to schedule the follow-up appointment.   Any Other Special Instructions Will Be Listed Below (If Applicable).     If you need a refill on your cardiac medications before your next appointment, please call your pharmacy.

## 2017-06-15 NOTE — Progress Notes (Signed)
Cardiology Office Note    Date:  06/15/2017   ID:  Felicia Kelly, DOB 1936/04/22, MRN [id number]  PCP:  [name], DO  Cardiologist:  Ena Dawley, MD   Chief complain: 1 year follow up  History of Present Illness:  Felicia Kelly is a 81 y.o. female who is a very pleasant patient who has a history of carotid disease, hypertension, hyperlipidemia treated with pravastatin who is coming with concern of worsening dyspnea on exertion in the settings of significant family history of coronary artery disease. Her brother died of sudden cardiac death in the settings of myocardial infarction and stroke just a months ago. Her sister was also my patient had myocardial infarction at the age of 76. The patient moved here from [place] 3 years ago where she underwent bilateral hip replacement and hasn't been active ever since. She had carotid ultrasound for the purposes of life insurance performed that showed mild left carotid disease.  06/15/2017 - the patient is coming after one year, she feels and looks great, she walks with her friend daily 25-30 minutes, she originally felt short of breath but over time it has completely resolved. She also denies chest pain dizziness claudications. She has noticed mild puffiness around her left ankle that she broke the child. She would like to switch her medicine as valsartan is on recall. She denies any orthopnea or proximal nocturnal dyspnea.  Past Medical History:  Diagnosis Date  . Arthritis   . Colonic polyp   . Genital herpes   . Hyperlipidemia   . Hypertension   . Osteoporosis   . Thyroid disease    seeing Dr. Buddy Duty  . Tremor     Past Surgical History:  Procedure Laterality Date  . ABDOMINAL HYSTERECTOMY  1988   no cancer - reports total with bilat oophorectomy  . APPENDECTOMY  1988  . BREAST SURGERY  1985   biopsy  . FOOT SURGERY    . TOTAL HIP ARTHROPLASTY      Current Medications: Outpatient Medications Prior to Visit  Medication Sig  Dispense Refill  . ascorbic acid (VITAMIN C) 500 MG tablet Take 500 mg by mouth daily.    Marland Kitchen aspirin 81 MG tablet Take 81 mg by mouth daily.    Marland Kitchen atenolol (TENORMIN) 25 MG tablet TAKE 1/2 TABLETS (12.5 MG TOTAL) BY MOUTH DAILY. 45 tablet 5  . benzonatate (TESSALON) 100 MG capsule Take 1 capsule (100 mg total) by mouth 2 (two) times daily as needed for cough. 20 capsule 0  . hydrochlorothiazide (HYDRODIURIL) 12.5 MG tablet Take 1 tablet (12.5 mg total) by mouth daily. 90 tablet 3  . levothyroxine (SYNTHROID, LEVOTHROID) 50 MCG tablet Take 50 mcg by mouth daily before breakfast.    . Multiple Vitamin (MULTIVITAMIN) capsule Take 1 capsule by mouth daily.    . pravastatin (PRAVACHOL) 80 MG tablet Take 1 tablet (80 mg total) by mouth daily. 90 tablet 3  . valACYclovir (VALTREX) 500 MG tablet TAKE 1 TABLET (500 MG TOTAL) BY MOUTH DAILY. 90 tablet 1  . valsartan-hydrochlorothiazide (DIOVAN-HCT) 160-12.5 MG tablet TAKE 1/2 TABLETS BY MOUTH DAILY. 45 tablet 2   No facility-administered medications prior to visit.      Allergies:   Bactrim [sulfamethoxazole-trimethoprim]; Celebrex [celecoxib]; Crestor [rosuvastatin]; Lipitor [atorvastatin]; Niaspan [niacin er]; Tetracyclines & related; Zocor [simvastatin]; and Penicillins   Social History   Social History  . Marital status: Widowed    Spouse name: N/A  . Number of children: N/A  . Years  of education: N/A   Social History Main Topics  . Smoking status: Former Research scientist (life sciences)  . Smokeless tobacco: Never Used  . Alcohol use No  . Drug use: No  . Sexual activity: Not Asked   Other Topics Concern  . None   Social History Narrative   Work or School: none      Home Situation: widower, lives alone      Spiritual Beliefs: Christian      Lifestyle: no regular exercise; diet is ok          Family History:  The patient's family history includes Arthritis in her mother; Cancer in her father; Diabetes in her mother; Heart attack (age of onset: 2) in her  sister; Heart disease in her father and mother; Hyperlipidemia in her mother; Hypertension in her mother; Liver disease in her brother; Pancreatic cancer in her father.   ROS:   Please see the history of present illness.    ROS All other systems reviewed and are negative.   PHYSICAL EXAM:   VS:  BP 136/72   Pulse 61   Ht 5\' 2"  (1.575 m)   Wt 160 lb (72.6 kg)   SpO2 98%   BMI 29.26 kg/m    GEN: Well nourished, well developed, in no acute distress  HEENT: normal  Neck: no JVD, carotid bruits, or masses Cardiac: RRR; no murmurs, rubs, or gallops,no edema  Respiratory:  clear to auscultation bilaterally, normal work of breathing GI: soft, nontender, nondistended, + BS MS: no deformity or atrophy  Skin: warm and dry, no rash Neuro:  Alert and Oriented x 3, Strength and sensation are intact Psych: euthymic mood, full affect  Wt Readings from Last 3 Encounters:  06/15/17 160 lb (72.6 kg)  05/29/17 155 lb 11.2 oz (70.6 kg)  02/08/17 155 lb 14.4 oz (70.7 kg)    Studies/Labs Reviewed:   EKG:  EKG is ordered today.  The ekg ordered today demonstrates Sinus bradycardia with PACs.  Recent Labs: 07/11/2016: TSH 1.53 05/29/2017: BUN 21; Creatinine, Ser 0.74; Hemoglobin 13.1; Platelets 254.0; Potassium 4.0; Sodium 142   Lipid Panel    Component Value Date/Time   CHOL 200 02/08/2017 1023   TRIG 152.0 (H) 02/08/2017 1023   HDL 49.00 02/08/2017 1023   CHOLHDL 4 02/08/2017 1023   VLDL 30.4 02/08/2017 1023   LDLCALC 121 (H) 02/08/2017 1023   EKG performed today 06/15/2017 shows sinus bradycardia otherwise normal and unchanged from prior. This was personally reviewed.   ASSESSMENT:    No diagnosis found.   PLAN:  In order of problems listed above:  1. Dyspnea on exertion - results since last year with increase exercise, she had a normal stress test in December 2017. The patient has a very significant family history of premature coronary artery disease with both of her siblings  having heart attacks and brother dying of sudden cardiac death. We will continue aspirin, high-dose pravastatin, atenolol. We will discontinue valsartan and start losartan 50 mg daily..  2. Hypertension - well controlled, will discontinue valsartan because of recall, start losartan 50 mg daily, increase hydrochlorothiazide from 12.5-25 mg daily she has mild lower extremity edema. 2. Hyperlipidemia - on high dose of pravastatin, HDL 83, triglycerides 152, LDL 122, I would continue the same management she is advised to add fish oil to her diet and cut down on her sweets and carbs.  3. Carotid disease, we will repeat ultrasound now.  Medication Adjustments/Labs and Tests Ordered: Current medicines are reviewed at  length with the patient today.  Concerns regarding medicines are outlined above.  Medication changes, Labs and Tests ordered today are listed in the Patient Instructions below. There are no Patient Instructions on file for this visit.   Signed, Ena Dawley, MD  06/15/2017 11:56 AM    [address], [place], [place]  [address] Phone: [phone]; Fax: [phone]

## 2017-06-22 ENCOUNTER — Ambulatory Visit: Payer: Medicare Other

## 2017-06-30 ENCOUNTER — Other Ambulatory Visit: Payer: Self-pay | Admitting: Family Medicine

## 2017-07-01 ENCOUNTER — Other Ambulatory Visit: Payer: Self-pay | Admitting: Family Medicine

## 2017-07-17 ENCOUNTER — Ambulatory Visit
Admission: RE | Admit: 2017-07-17 | Discharge: 2017-07-17 | Disposition: A | Discharge status: Home / Self Care | Payer: Medicare Other | Source: Ambulatory Visit | Attending: Family Medicine | Admitting: Family Medicine

## 2017-07-17 DIAGNOSIS — Z1231 Encounter for screening mammogram for malignant neoplasm of breast: Secondary | ICD-10-CM | POA: Diagnosis not present

## 2017-07-25 ENCOUNTER — Ambulatory Visit (HOSPITAL_COMMUNITY)
Admission: RE | Admit: 2017-07-25 | Discharge: 2017-07-25 | Disposition: A | Discharge status: Home / Self Care | Payer: Medicare Other | Source: Ambulatory Visit | Attending: Internal Medicine | Admitting: Internal Medicine

## 2017-07-25 DIAGNOSIS — E042 Nontoxic multinodular goiter: Secondary | ICD-10-CM | POA: Insufficient documentation

## 2017-07-25 DIAGNOSIS — I10 Essential (primary) hypertension: Secondary | ICD-10-CM

## 2017-07-25 DIAGNOSIS — I6523 Occlusion and stenosis of bilateral carotid arteries: Secondary | ICD-10-CM | POA: Diagnosis not present

## 2017-07-25 DIAGNOSIS — I779 Disorder of arteries and arterioles, unspecified: Secondary | ICD-10-CM | POA: Diagnosis not present

## 2017-07-25 DIAGNOSIS — I739 Peripheral vascular disease, unspecified: Secondary | ICD-10-CM

## 2017-08-07 ENCOUNTER — Telehealth: Payer: Self-pay | Admitting: Cardiology

## 2017-08-07 NOTE — Telephone Encounter (Signed)
Felicia Kelly is calling because the Dystolic number is being low . Please call

## 2017-08-07 NOTE — Telephone Encounter (Signed)
Pt is calling as instructed by Dr [name], to report how her BP is since she last saw her in the office.  Pt states she is worried, because her systolic is staying around 166-060 and diastolic is a little lower than normal at 54-58.  Pt states she is taking her meds exactly as prescribed.  Pt reports no further cardiac complaints. Pt states she still has mild LEE, but this does reside with the use of her HCTZ.  Pts last BP reading was 140/54.  Pt would like for Dr [name] to advise if this is an acceptable number, or should she change her meds around.  Pt reports if Dr [name] is ok with these readings, then she will continue her current regimen as is.  Informed the pt that Dr [name] is out of the office today, but I will route this message to her for further review and recommendation and follow-up with the pt thereafter.  Pt verbalized understanding and agrees with this plan.

## 2017-08-08 MED ORDER — LOSARTAN POTASSIUM 100 MG PO TABS: 100.0000 mg | ORAL_TABLET | Freq: Every day | ORAL | 3 refills | Status: DC

## 2017-08-08 NOTE — Telephone Encounter (Signed)
Increase losartan to 100 mg po daily

## 2017-08-08 NOTE — Telephone Encounter (Signed)
Spoke with the pt and informed her that per Dr [name], she recommends that we increase her losartan to 100 mg po daily.  Confirmed the pharmacy of choice with the pt.  Pt verbalized understanding and agrees with this plan.

## 2017-08-22 DIAGNOSIS — H6123 Impacted cerumen, bilateral: Secondary | ICD-10-CM | POA: Diagnosis not present

## 2017-08-22 DIAGNOSIS — H838X3 Other specified diseases of inner ear, bilateral: Secondary | ICD-10-CM | POA: Diagnosis not present

## 2017-08-22 DIAGNOSIS — H903 Sensorineural hearing loss, bilateral: Secondary | ICD-10-CM | POA: Diagnosis not present

## 2017-08-23 DIAGNOSIS — D225 Melanocytic nevi of trunk: Secondary | ICD-10-CM | POA: Diagnosis not present

## 2017-09-13 DIAGNOSIS — H43811 Vitreous degeneration, right eye: Secondary | ICD-10-CM | POA: Diagnosis not present

## 2017-09-13 DIAGNOSIS — H04123 Dry eye syndrome of bilateral lacrimal glands: Secondary | ICD-10-CM | POA: Diagnosis not present

## 2017-09-13 DIAGNOSIS — Z961 Presence of intraocular lens: Secondary | ICD-10-CM | POA: Diagnosis not present

## 2017-09-13 LAB — HM DIABETES EYE EXAM

## 2017-09-21 DIAGNOSIS — I781 Nevus, non-neoplastic: Secondary | ICD-10-CM | POA: Diagnosis not present

## 2017-09-21 DIAGNOSIS — L820 Inflamed seborrheic keratosis: Secondary | ICD-10-CM | POA: Diagnosis not present

## 2017-09-27 NOTE — Progress Notes (Signed)
HPI:  Felicia Kelly is a pleasant 81 y.o. here for follow up. Chronic medical problems summarized below were reviewed for changes .  She is doing well.  No complaints today.  She only takes her diuretic as needed for swelling in the ankles.  Getting as much exercise, but tries to stay active.  Denies CP, SOB, DOE, treatment intolerance or new symptoms. Due for labs.  Hypertension, hyperlipidemia, BMI 28: -Medications include: Aspirin 81 mg, atenolol 12.5 mg twice a day, losartan, hctz as needed for swelling and pravastatin  Hypothyroidism: -Sees endocrinologist for management, Dr. Buddy Duty -On levothyroxine  Osteoporosis: -Doing vitamin D and weightbearing exercise -Last DEXA: 02/2016  Genital herpes: -Suppressive therapy with Valtrex  History of pulmonary nodule: -Repeat CT scheduled for this December, scheduled  ROS: See pertinent positives and negatives per HPI.  Past Medical History:  Diagnosis Date  . Arthritis   . Colonic polyp   . Genital herpes   . Hyperlipidemia   . Hypertension   . Osteoporosis   . Thyroid disease    seeing Dr. Buddy Duty  . Tremor     Past Surgical History:  Procedure Laterality Date  . ABDOMINAL HYSTERECTOMY  1988   no cancer - reports total with bilat oophorectomy  . APPENDECTOMY  1988  . BREAST CYST EXCISION Bilateral   . BREAST SURGERY  1985   biopsy  . FOOT SURGERY    . TOTAL HIP ARTHROPLASTY      Family History  Problem Relation Age of Onset  . Cancer Father        pancreatic  . Pancreatic cancer Father   . Heart disease Father   . Heart attack Sister 40  . Arthritis Mother   . Hyperlipidemia Mother   . Hypertension Mother   . Heart disease Mother   . Diabetes Mother   . Liver disease Brother   . Breast cancer Neg Hx     Social History   Socioeconomic History  . Marital status: Widowed    Spouse name: None  . Number of children: None  . Years of education: None  . Highest education level: None  Social Needs  .  Financial resource strain: None  . Food insecurity - worry: None  . Food insecurity - inability: None  . Transportation needs - medical: None  . Transportation needs - non-medical: None  Occupational History  . None  Tobacco Use  . Smoking status: Former Research scientist (life sciences)  . Smokeless tobacco: Never Used  Substance and Sexual Activity  . Alcohol use: No    Alcohol/week: 0.0 oz  . Drug use: No  . Sexual activity: None  Other Topics Concern  . None  Social History Narrative   Work or School: none      Home Situation: widower, lives alone      Spiritual Beliefs: Christian      Lifestyle: no regular exercise; diet is ok        Current Outpatient Medications:  .  ascorbic acid (VITAMIN C) 500 MG tablet, Take 500 mg by mouth daily., Disp: , Rfl:  .  aspirin 81 MG tablet, Take 81 mg by mouth daily., Disp: , Rfl:  .  atenolol (TENORMIN) 25 MG tablet, TAKE 1/2 TABLETS (12.5 MG TOTAL) BY MOUTH DAILY., Disp: 45 tablet, Rfl: 5 .  benzonatate (TESSALON) 100 MG capsule, Take 1 capsule (100 mg total) by mouth 2 (two) times daily as needed for cough., Disp: 20 capsule, Rfl: 0 .  levothyroxine (SYNTHROID, LEVOTHROID) 50 MCG  tablet, Take 50 mcg by mouth daily before breakfast., Disp: , Rfl:  .  losartan (COZAAR) 100 MG tablet, Take 1 tablet (100 mg total) by mouth daily., Disp: 90 tablet, Rfl: 3 .  Multiple Vitamin (MULTIVITAMIN) capsule, Take 1 capsule by mouth daily., Disp: , Rfl:  .  pravastatin (PRAVACHOL) 80 MG tablet, TAKE 1 TABLET (80 MG TOTAL) BY MOUTH DAILY., Disp: 90 tablet, Rfl: 1 .  valACYclovir (VALTREX) 500 MG tablet, TAKE 1 TABLET (500 MG TOTAL) BY MOUTH DAILY., Disp: 90 tablet, Rfl: 1 .  hydrochlorothiazide (HYDRODIURIL) 25 MG tablet, 1/2 half tablet daily as needed for edema., Disp: 90 tablet, Rfl: 3  EXAM:  Vitals:   09/28/17 0830  BP: 102/64  Pulse: (!) 59  Temp: 98.1 F (36.7 C)    Body mass index is 29.63 kg/m.  GENERAL: vitals reviewed and listed above, alert, oriented,  appears well hydrated and in no acute distress  HEENT: atraumatic, conjunttiva clear, no obvious abnormalities on inspection of external nose and ears  NECK: no obvious masses on inspection  LUNGS: clear to auscultation bilaterally, no wheezes, rales or rhonchi, good air movement  CV: HRRR, no peripheral edema  MS: moves all extremities without noticeable abnormality  PSYCH: pleasant and cooperative, no obvious depression or anxiety  ASSESSMENT AND PLAN:  Discussed the following assessment and plan:  Essential hypertension - Plan: Basic metabolic panel, CBC  Pure hypercholesterolemia - Plan: Lipid panel  Hyperglycemia  BMI 29.0-29.9,adult  Carotid artery disease, unspecified laterality (HCC) - Plan: hydrochlorothiazide (HYDRODIURIL) 25 MG tablet  Hypertension, unspecified type - Plan: hydrochlorothiazide (HYDRODIURIL) 25 MG tablet  -Labs today -Lifestyle recommendations -Follow-up 3-4 months, as needed in interim  Patient Instructions  BEFORE YOU LEAVE: -labs -follow up: 3-4 months  We have ordered labs or studies at this visit. It can take up to 1-2 weeks for results and processing. IF results require follow up or explanation, we will call you with instructions. Clinically stable results will be released to your [facility]. If you have not heard from [place] or cannot find your results in [facility] in 2 weeks please contact our office at [phone].  If you are not yet signed up for [facility], please consider signing up.   We recommend the following healthy lifestyle for LIFE: 1) Small portions. But, make sure to get regular (at least 3 per day), healthy meals and small healthy snacks if needed.  2) Eat a healthy clean diet.   TRY TO EAT: -at least 5-7 servings of low sugar, colorful, and nutrient rich vegetables per day (not corn, potatoes or bananas.) -berries are the best choice if you wish to eat fruit (only eat small amounts if trying to reduce weight)  -lean meets  (fish, white meat of chicken or [place]) -vegan proteins for some meals - beans or tofu, whole grains, nuts and seeds -Replace bad fats with good fats - good fats include: fish, nuts and seeds, canola oil, olive oil -small amounts of low fat or non fat dairy -small amounts of100 % whole grains - check the lables -drink plenty of water  AVOID: -SUGAR, sweets, anything with added sugar, corn syrup or sweeteners - must read labels as even foods advertised as "healthy" often are loaded with sugar -if you must have a sweetener, small amounts of stevia may be best -sweetened beverages and artificially sweetened beverages -simple starches (rice, bread, potatoes, pasta, chips, etc - small amounts of 100% whole grains are ok) -red meat, pork, butter -fried foods, fast  food, processed food, excessive dairy, eggs and coconut.  3)Get at least 150 minutes of sweaty aerobic exercise per week.  4)Reduce stress - consider counseling, meditation and relaxation to balance other aspects of your life.          Colin Benton R., DO

## 2017-09-28 ENCOUNTER — Encounter: Payer: Self-pay | Admitting: Family Medicine

## 2017-09-28 ENCOUNTER — Ambulatory Visit (INDEPENDENT_AMBULATORY_CARE_PROVIDER_SITE_OTHER): Payer: Medicare Other | Admitting: Family Medicine

## 2017-09-28 VITALS — BP 102/64 | HR 59 | Temp 98.1°F | Ht 62.0 in | Wt 162.0 lb

## 2017-09-28 DIAGNOSIS — Z6829 Body mass index (BMI) 29.0-29.9, adult: Secondary | ICD-10-CM

## 2017-09-28 DIAGNOSIS — I10 Essential (primary) hypertension: Secondary | ICD-10-CM

## 2017-09-28 DIAGNOSIS — R739 Hyperglycemia, unspecified: Secondary | ICD-10-CM | POA: Diagnosis not present

## 2017-09-28 DIAGNOSIS — I779 Disorder of arteries and arterioles, unspecified: Secondary | ICD-10-CM

## 2017-09-28 DIAGNOSIS — E7800 Pure hypercholesterolemia, unspecified: Secondary | ICD-10-CM | POA: Diagnosis not present

## 2017-09-28 DIAGNOSIS — R944 Abnormal results of kidney function studies: Secondary | ICD-10-CM

## 2017-09-28 DIAGNOSIS — I739 Peripheral vascular disease, unspecified: Secondary | ICD-10-CM

## 2017-09-28 LAB — BASIC METABOLIC PANEL
BUN: 17 mg/dL (ref 6–23)
CO2: 31 mEq/L (ref 19–32)
Calcium: 9.4 mg/dL (ref 8.4–10.5)
Chloride: 104 mEq/L (ref 96–112)
Creatinine, Ser: 0.98 mg/dL (ref 0.40–1.20)
GFR: 58.09 mL/min — ABNORMAL LOW (ref >60.00)
Glucose, Bld: 93 mg/dL (ref 70–99)
Potassium: 4.1 mEq/L (ref 3.5–5.1)
Sodium: 142 mEq/L (ref 135–145)

## 2017-09-28 LAB — CBC
HCT: 38.7 % (ref 36.0–46.0)
Hemoglobin: 12.8 g/dL (ref 12.0–15.0)
MCHC: 33 g/dL (ref 30.0–36.0)
MCV: 91.2 fl (ref 78.0–100.0)
Platelets: 280 10*3/uL (ref 150.0–400.0)
RBC: 4.25 Mil/uL (ref 3.87–5.11)
RDW: 13.6 % (ref 11.5–15.5)
WBC: 7 10*3/uL (ref 4.0–10.5)

## 2017-09-28 LAB — LIPID PANEL
Cholesterol: 177 mg/dL (ref 0–200)
HDL: 43.3 mg/dL (ref >39.00)
LDL Cholesterol: 94 mg/dL (ref 0–99)
NonHDL: 133.62
Total CHOL/HDL Ratio: 4
Triglycerides: 197 mg/dL — ABNORMAL HIGH (ref 0.0–149.0)
VLDL: 39.4 mg/dL (ref 0.0–40.0)

## 2017-09-28 NOTE — Patient Instructions (Signed)
BEFORE YOU LEAVE: -labs -follow up: 3-4 months  We have ordered labs or studies at this visit. It can take up to 1-2 weeks for results and processing. IF results require follow up or explanation, we will call you with instructions. Clinically stable results will be released to your MYCHART. If you have not heard from us or cannot find your results in MYCHART in 2 weeks please contact our office at [phone].  If you are not yet signed up for MYCHART, please consider signing up.   We recommend the following healthy lifestyle for LIFE: 1) Small portions. But, make sure to get regular (at least 3 per day), healthy meals and small healthy snacks if needed.  2) Eat a healthy clean diet.   TRY TO EAT: -at least 5-7 servings of low sugar, colorful, and nutrient rich vegetables per day (not corn, potatoes or bananas.) -berries are the best choice if you wish to eat fruit (only eat small amounts if trying to reduce weight)  -lean meets (fish, white meat of chicken or turkey) -vegan proteins for some meals - beans or tofu, whole grains, nuts and seeds -Replace bad fats with good fats - good fats include: fish, nuts and seeds, canola oil, olive oil -small amounts of low fat or non fat dairy -small amounts of100 % whole grains - check the lables -drink plenty of water  AVOID: -SUGAR, sweets, anything with added sugar, corn syrup or sweeteners - must read labels as even foods advertised as "healthy" often are loaded with sugar -if you must have a sweetener, small amounts of stevia may be best -sweetened beverages and artificially sweetened beverages -simple starches (rice, bread, potatoes, pasta, chips, etc - small amounts of 100% whole grains are ok) -red meat, pork, butter -fried foods, fast food, processed food, excessive dairy, eggs and coconut.  3)Get at least 150 minutes of sweaty aerobic exercise per week.  4)Reduce stress - consider counseling, meditation and relaxation to balance  other aspects of your life.        

## 2017-10-01 NOTE — Addendum Note (Signed)
Addended by: Agnes Lawrence on: 10/01/2017 02:49 PM   Modules accepted: Orders

## 2017-10-03 ENCOUNTER — Other Ambulatory Visit: Payer: Medicare Other

## 2017-10-04 ENCOUNTER — Ambulatory Visit
Admission: RE | Admit: 2017-10-04 | Discharge: 2017-10-04 | Disposition: A | Discharge status: Home / Self Care | Payer: Medicare Other | Source: Ambulatory Visit | Attending: Family Medicine | Admitting: Family Medicine

## 2017-10-04 DIAGNOSIS — R911 Solitary pulmonary nodule: Secondary | ICD-10-CM

## 2017-10-14 DIAGNOSIS — I700 Atherosclerosis of aorta: Secondary | ICD-10-CM | POA: Insufficient documentation

## 2017-10-14 NOTE — Progress Notes (Signed)
HPI:  Follow up CT results pulmonary nodule: -had incidental pulm nodule on CT done with her urologist1 year ago --> radiologist advised 1 year repeat and pt asked that we order that CT -CT done recently showed stable RML nodule and per report  "newly identified mixed solid and ground-glass nodule in the lingula, follow-up CT chest in 3-6 months is recommended. If the nodule is stable on that examination, then annual follow-up CT is recommended for 5 years. This recommendation follows the consensus statement: Guidelines for Management of Small Pulmonary Nodules Detected on CT Images: From the Fleischner Society 2017; Radiology 2017; 284:228-243." -mild coronary atherosclerosis and aortic atherosclerosis are incidental findings  Not mentioned in summary - but in detailed report - thyroid nodule. She has hx of this and sees endocrinologist for management.  New complaint of dysuria: -times several days -hx OOB and UTI -she feels is a UTI -sees urologist, Dr. Edwena Blow, myerbetriq too expensive -symptoms last few days include bladder pressure with urination, dysuria, urge/freq -no fevers, hematuria, nausea, vomiting   ROS: See pertinent positives and negatives per HPI.  Past Medical History:  Diagnosis Date  . Arthritis   . Colonic polyp   . Genital herpes   . Hyperlipidemia   . Hypertension   . Osteoporosis   . Thyroid disease    seeing Dr. Buddy Duty  . Tremor     Past Surgical History:  Procedure Laterality Date  . ABDOMINAL HYSTERECTOMY  1988   no cancer - reports total with bilat oophorectomy  . APPENDECTOMY  1988  . BREAST CYST EXCISION Bilateral   . BREAST SURGERY  1985   biopsy  . FOOT SURGERY    . TOTAL HIP ARTHROPLASTY      Family History  Problem Relation Age of Onset  . Cancer Father        pancreatic  . Pancreatic cancer Father   . Heart disease Father   . Heart attack Sister 9  . Arthritis Mother   . Hyperlipidemia Mother   . Hypertension Mother   .  Heart disease Mother   . Diabetes Mother   . Liver disease Brother   . Breast cancer Neg Hx     Social History   Socioeconomic History  . Marital status: Widowed    Spouse name: None  . Number of children: None  . Years of education: None  . Highest education level: None  Social Needs  . Financial resource strain: None  . Food insecurity - worry: None  . Food insecurity - inability: None  . Transportation needs - medical: None  . Transportation needs - non-medical: None  Occupational History  . None  Tobacco Use  . Smoking status: Former Research scientist (life sciences)  . Smokeless tobacco: Never Used  Substance and Sexual Activity  . Alcohol use: No    Alcohol/week: 0.0 oz  . Drug use: No  . Sexual activity: None  Other Topics Concern  . None  Social History Narrative   Work or School: none      Home Situation: widower, lives alone      Spiritual Beliefs: Christian      Lifestyle: no regular exercise; diet is ok        Current Outpatient Medications:  .  ascorbic acid (VITAMIN C) 500 MG tablet, Take 500 mg by mouth daily., Disp: , Rfl:  .  aspirin 81 MG tablet, Take 81 mg by mouth daily., Disp: , Rfl:  .  atenolol (TENORMIN) 25 MG tablet, TAKE 1/2 TABLETS (  12.5 MG TOTAL) BY MOUTH DAILY., Disp: 45 tablet, Rfl: 5 .  benzonatate (TESSALON) 100 MG capsule, Take 1 capsule (100 mg total) by mouth 2 (two) times daily as needed for cough., Disp: 20 capsule, Rfl: 0 .  hydrochlorothiazide (HYDRODIURIL) 25 MG tablet, 1/2 half tablet daily as needed for edema., Disp: 90 tablet, Rfl: 3 .  levothyroxine (SYNTHROID, LEVOTHROID) 50 MCG tablet, Take 50 mcg by mouth daily before breakfast., Disp: , Rfl:  .  losartan (COZAAR) 100 MG tablet, Take 1 tablet (100 mg total) by mouth daily., Disp: 90 tablet, Rfl: 3 .  Multiple Vitamin (MULTIVITAMIN) capsule, Take 1 capsule by mouth daily., Disp: , Rfl:  .  pravastatin (PRAVACHOL) 80 MG tablet, TAKE 1 TABLET (80 MG TOTAL) BY MOUTH DAILY., Disp: 90 tablet, Rfl:  1 .  valACYclovir (VALTREX) 500 MG tablet, TAKE 1 TABLET (500 MG TOTAL) BY MOUTH DAILY., Disp: 90 tablet, Rfl: 1 .  nitrofurantoin, macrocrystal-monohydrate, (MACROBID) 100 MG capsule, Take 1 capsule (100 mg total) by mouth 2 (two) times daily., Disp: 14 capsule, Rfl: 0  EXAM:  Vitals:   10/15/17 1058  BP: 122/74  Pulse: 67  Temp: 97.9 F (36.6 C)    Body mass index is 29.63 kg/m.  GENERAL: vitals reviewed and listed above, alert, oriented, appears well hydrated and in no acute distress  HEENT: atraumatic, conjunttiva clear, no obvious abnormalities on inspection of external nose and ears  NECK: no obvious masses on inspection - ? Fullness R thyroid  LUNGS: clear to auscultation bilaterally, no wheezes, rales or rhonchi, good air movement  CV: HRRR, no peripheral edema  ABD: no CVA TTP  MS: moves all extremities without noticeable abnormality  PSYCH: pleasant and cooperative, no obvious depression or anxiety  ASSESSMENT AND PLAN:  Discussed the following assessment and plan:  Solitary pulmonary nodule  Aortic atherosclerosis (HCC)  Dysuria - Plan: POC Urinalysis Dipstick, Urine Microscopic Only, Culture, Urine  Thyroid nodule  -reviewed CT report with patient and implications -advised CT in 3-6 months for the lung nodule, then if stable, annually for 5 years per radiology recs - she wants to do in 3-4 months -she plans to f/u with her endocrinologist about the thyroid finding - she took copy report with her -discussed implications atherosclerosis, on statin and asa, discussed risks reduction -emperic abx for possible uti per her preference, micro and culture pending -Patient advised to return or notify a doctor immediately if symptoms worsen or persist or new concerns arise.  Patient Instructions  BEFORE YOU LEAVE: -order CT 3-4 months -urine micro and culture -follow up: 3-4 months  We will plan to recheck the lung CT in 3-4 months.  Please talk with your  endocrinologist about the thyroid findings as planned.  Take the antibiotic. Further urine studies are pending.     Colin Benton R., DO

## 2017-10-15 ENCOUNTER — Encounter: Payer: Self-pay | Admitting: Family Medicine

## 2017-10-15 ENCOUNTER — Ambulatory Visit (INDEPENDENT_AMBULATORY_CARE_PROVIDER_SITE_OTHER): Payer: Medicare Other | Admitting: Family Medicine

## 2017-10-15 VITALS — BP 122/74 | HR 67 | Temp 97.9°F | Ht 62.0 in

## 2017-10-15 DIAGNOSIS — E041 Nontoxic single thyroid nodule: Secondary | ICD-10-CM

## 2017-10-15 DIAGNOSIS — R300 Dysuria: Secondary | ICD-10-CM | POA: Diagnosis not present

## 2017-10-15 DIAGNOSIS — R911 Solitary pulmonary nodule: Secondary | ICD-10-CM

## 2017-10-15 DIAGNOSIS — I700 Atherosclerosis of aorta: Secondary | ICD-10-CM

## 2017-10-15 LAB — URINALYSIS, MICROSCOPIC ONLY

## 2017-10-15 LAB — POCT URINALYSIS DIPSTICK
Bilirubin, UA: NEGATIVE
Glucose, UA: NEGATIVE
Ketones, UA: NEGATIVE
Leukocytes, UA: NEGATIVE
Nitrite, UA: NEGATIVE
Protein, UA: NEGATIVE
Spec Grav, UA: 1.02 (ref 1.010–1.025)
Urobilinogen, UA: 0.2 E.U./dL
pH, UA: 5 (ref 5.0–8.0)

## 2017-10-15 MED ORDER — NITROFURANTOIN MONOHYD MACRO 100 MG PO CAPS: 100.0000 mg | ORAL_CAPSULE | Freq: Two times a day (BID) | ORAL | 0 refills | Status: DC

## 2017-10-15 NOTE — Patient Instructions (Signed)
BEFORE YOU LEAVE: -order CT 3-4 months -urine micro and culture -follow up: 3-4 months  We will plan to recheck the lung CT in 3-4 months.  Please talk with your endocrinologist about the thyroid findings as planned.  Take the antibiotic. Further urine studies are pending.

## 2017-10-15 NOTE — Addendum Note (Signed)
Addended by: Agnes Lawrence on: 10/15/2017 11:43 AM   Modules accepted: Orders

## 2017-10-16 LAB — URINE CULTURE
MICRO NUMBER:: 81463645
Result:: NO GROWTH
SPECIMEN QUALITY:: ADEQUATE

## 2017-10-18 NOTE — Addendum Note (Signed)
Addended by: Lahoma Crocker A on: 10/18/2017 11:02 AM   Modules accepted: Orders

## 2017-10-22 ENCOUNTER — Telehealth: Payer: Self-pay | Admitting: *Deleted

## 2017-10-22 NOTE — Telephone Encounter (Signed)
Can you change location of CT scan?

## 2017-10-22 NOTE — Telephone Encounter (Signed)
Copied from [place]. Topic: Inquiry >> Oct 18, 2017 11:55 AM Scherrie Gerlach wrote: Reason for CRM: pt states she does not want to change locations for her upcoming CT scan. Pt had CT last year at [place]. This year scheduled at [place], 01/14/2018 pt does not want to go there. Would like a call back to change this to [place]  >> Oct 18, 2017 12:15 PM Dorrene German, RN wrote: Suzi Roots, can you change location or do you need a new order?  >> Oct 22, 2017 12:19 PM Darl Householder, RMA wrote: Patient is requesting a call backfrom Joann concerning CT Scan please return pt call

## 2017-10-24 NOTE — Telephone Encounter (Signed)
Patient calling again, would like location changed to [place], would like to have done at the end of April

## 2017-10-24 NOTE — Telephone Encounter (Signed)
Gave this message to referral coordinator, she will change in computer.

## 2017-10-29 ENCOUNTER — Telehealth: Payer: Self-pay | Admitting: Family Medicine

## 2017-10-29 DIAGNOSIS — R911 Solitary pulmonary nodule: Secondary | ICD-10-CM

## 2017-10-29 NOTE — Telephone Encounter (Signed)
I also am not sure what referral she is referring to. Felicia Kelly, phone are working now? Please call her and find out what she needs. Perhaps she is talking about the repeat CT scan in 3-4 months?Thank you.

## 2017-10-29 NOTE — Telephone Encounter (Signed)
Copied from [place] [id number]. Topic: Inquiry >> Oct 29, 2017 10:32 AM Ahmed Prima L wrote: Please call patient back [phone]. Patient said if she didn't get a call today regarding her referral she was coming to the office in person. I advised her it was being changed in the computer & she said she still wants a call back from [place].

## 2017-10-29 NOTE — Telephone Encounter (Signed)
I called the pt and she stated she does not want to have the CT repeated around the date of her birthday for fear of bad news.  She also stated she would like to have this done at [place].  Orders re-entered and the pt is aware to call to schedule the CT scan.

## 2017-11-14 ENCOUNTER — Other Ambulatory Visit (INDEPENDENT_AMBULATORY_CARE_PROVIDER_SITE_OTHER): Payer: Medicare Other

## 2017-11-14 DIAGNOSIS — R944 Abnormal results of kidney function studies: Secondary | ICD-10-CM | POA: Diagnosis not present

## 2017-11-14 DIAGNOSIS — R300 Dysuria: Secondary | ICD-10-CM | POA: Diagnosis not present

## 2017-11-14 LAB — URINALYSIS, MICROSCOPIC ONLY

## 2017-11-15 LAB — BASIC METABOLIC PANEL
BUN: 20 mg/dL (ref 6–23)
CO2: 27 mEq/L (ref 19–32)
Calcium: 9.3 mg/dL (ref 8.4–10.5)
Chloride: 106 mEq/L (ref 96–112)
Creatinine, Ser: 0.85 mg/dL (ref 0.40–1.20)
GFR: 68.43 mL/min (ref >60.00)
Glucose, Bld: 95 mg/dL (ref 70–99)
Potassium: 3.9 mEq/L (ref 3.5–5.1)
Sodium: 142 mEq/L (ref 135–145)

## 2017-11-22 DIAGNOSIS — N3281 Overactive bladder: Secondary | ICD-10-CM | POA: Diagnosis not present

## 2017-11-22 DIAGNOSIS — R3121 Asymptomatic microscopic hematuria: Secondary | ICD-10-CM | POA: Diagnosis not present

## 2017-11-22 DIAGNOSIS — N200 Calculus of kidney: Secondary | ICD-10-CM | POA: Diagnosis not present

## 2017-12-12 DIAGNOSIS — E890 Postprocedural hypothyroidism: Secondary | ICD-10-CM | POA: Diagnosis not present

## 2017-12-14 ENCOUNTER — Other Ambulatory Visit: Payer: Self-pay | Admitting: Internal Medicine

## 2017-12-14 DIAGNOSIS — E042 Nontoxic multinodular goiter: Secondary | ICD-10-CM | POA: Diagnosis not present

## 2017-12-14 DIAGNOSIS — E890 Postprocedural hypothyroidism: Secondary | ICD-10-CM | POA: Diagnosis not present

## 2017-12-14 DIAGNOSIS — E0510 Thyrotoxicosis with toxic single thyroid nodule without thyrotoxic crisis or storm: Secondary | ICD-10-CM

## 2017-12-20 ENCOUNTER — Ambulatory Visit
Admission: RE | Admit: 2017-12-20 | Discharge: 2017-12-20 | Disposition: A | Discharge status: Home / Self Care | Payer: Medicare Other | Source: Ambulatory Visit | Attending: Internal Medicine | Admitting: Internal Medicine

## 2017-12-20 DIAGNOSIS — E042 Nontoxic multinodular goiter: Secondary | ICD-10-CM | POA: Diagnosis not present

## 2017-12-20 DIAGNOSIS — E0510 Thyrotoxicosis with toxic single thyroid nodule without thyrotoxic crisis or storm: Secondary | ICD-10-CM

## 2017-12-27 ENCOUNTER — Other Ambulatory Visit: Payer: Self-pay | Admitting: Family Medicine

## 2018-01-14 ENCOUNTER — Other Ambulatory Visit: Payer: Medicare Other

## 2018-01-24 ENCOUNTER — Ambulatory Visit: Payer: Medicare Other | Admitting: Family Medicine

## 2018-02-11 ENCOUNTER — Ambulatory Visit
Admission: RE | Admit: 2018-02-11 | Discharge: 2018-02-11 | Disposition: A | Discharge status: Home / Self Care | Payer: Medicare Other | Source: Ambulatory Visit | Attending: Family Medicine | Admitting: Family Medicine

## 2018-02-11 DIAGNOSIS — R918 Other nonspecific abnormal finding of lung field: Secondary | ICD-10-CM | POA: Diagnosis not present

## 2018-02-11 DIAGNOSIS — R911 Solitary pulmonary nodule: Secondary | ICD-10-CM

## 2018-02-11 NOTE — Progress Notes (Signed)
HPI:  Using dictation device. Unfortunately this device frequently misinterprets words/phrases. Due for labs, ct results review  Felicia Kelly is a pleasant 82 y.o. here for follow up. Chronic medical problems summarized below were reviewed for changes and stability and were updated as needed below. These issues and their treatment remain stable for the most part.  She has been feeling well.  She is curious about the results of her recent CT scan. Denies CP, cough, SOB, DOE, treatment intolerance or new symptoms.   Hypertension, hyperlipidemia, BMI 28, atherosclerosis aorta: -Medications include:Aspirin 81 mg, atenolol 12.5 mg twice a day, losartan, hctz as needed for swelling and pravastatin  Hypothyroidism: -Sees endocrinologist for management, Dr.Kerr -On levothyroxine  Osteoporosis: -Doing vitamin D and weightbearing exercise -Last DEXA:02/2016  Genital herpes: -Suppressive therapy with Valtrex  History of pulmonary nodule: -Repeat CT scheduled for this December, scheduled -repeat CT 01/2018 stable, with recs to consider one more CT in 12-18 months   ROS: See pertinent positives and negatives per HPI.  Past Medical History:  Diagnosis Date  . Arthritis   . Colonic polyp   . Genital herpes   . Hyperlipidemia   . Hypertension   . Osteoporosis   . Thyroid disease    seeing Dr. Buddy Duty  . Tremor     Past Surgical History:  Procedure Laterality Date  . ABDOMINAL HYSTERECTOMY  1988   no cancer - reports total with bilat oophorectomy  . APPENDECTOMY  1988  . BREAST CYST EXCISION Bilateral   . BREAST SURGERY  1985   biopsy  . FOOT SURGERY    . TOTAL HIP ARTHROPLASTY      Family History  Problem Relation Age of Onset  . Cancer Father        pancreatic  . Pancreatic cancer Father   . Heart disease Father   . Heart attack Sister 65  . Arthritis Mother   . Hyperlipidemia Mother   . Hypertension Mother   . Heart disease Mother   . Diabetes Mother    . Liver disease Brother   . Breast cancer Neg Hx     SOCIAL HX: see hpi   Current Outpatient Medications:  .  ascorbic acid (VITAMIN C) 500 MG tablet, Take 500 mg by mouth daily., Disp: , Rfl:  .  aspirin 81 MG tablet, Take 81 mg by mouth daily., Disp: , Rfl:  .  atenolol (TENORMIN) 25 MG tablet, TAKE 1/2 TABLETS (12.5 MG TOTAL) BY MOUTH DAILY., Disp: 45 tablet, Rfl: 5 .  benzonatate (TESSALON) 100 MG capsule, Take 1 capsule (100 mg total) by mouth 2 (two) times daily as needed for cough., Disp: 20 capsule, Rfl: 0 .  hydrochlorothiazide (HYDRODIURIL) 25 MG tablet, 1/2 half tablet daily as needed for edema., Disp: 90 tablet, Rfl: 3 .  levothyroxine (SYNTHROID, LEVOTHROID) 50 MCG tablet, Take 50 mcg by mouth daily before breakfast., Disp: , Rfl:  .  Multiple Vitamin (MULTIVITAMIN) capsule, Take 1 capsule by mouth daily., Disp: , Rfl:  .  nitrofurantoin, macrocrystal-monohydrate, (MACROBID) 100 MG capsule, Take 1 capsule (100 mg total) by mouth 2 (two) times daily., Disp: 14 capsule, Rfl: 0 .  pravastatin (PRAVACHOL) 80 MG tablet, TAKE 1 TABLET (80 MG TOTAL) BY MOUTH DAILY., Disp: 90 tablet, Rfl: 1 .  valACYclovir (VALTREX) 500 MG tablet, TAKE 1 TABLET (500 MG TOTAL) BY MOUTH DAILY., Disp: 90 tablet, Rfl: 1 .  losartan (COZAAR) 100 MG tablet, Take 1 tablet (100 mg total) by mouth daily., Disp: 90  tablet, Rfl: 3  EXAM:  Vitals:   02/12/18 0855  BP: 120/80  Pulse: 72  Temp: 98.3 F (36.8 C)    Body mass index is 28.7 kg/m.  GENERAL: vitals reviewed and listed above, alert, oriented, appears well hydrated and in no acute distress  HEENT: atraumatic, conjunttiva clear, no obvious abnormalities on inspection of external nose and ears  NECK: no obvious masses on inspection  LUNGS: clear to auscultation bilaterally, no wheezes, rales or rhonchi, good air movement  CV: HRRR, no peripheral edema  MS: moves all extremities without noticeable abnormality  PSYCH: pleasant and  cooperative, no obvious depression or anxiety  ASSESSMENT AND PLAN:  Discussed the following assessment and plan:  Essential hypertension  Aortic atherosclerosis (HCC)  Pure hypercholesterolemia  Hyperglycemia  BMI 28.0-28.9,adult  Pulmonary nodule  Other specified hypothyroidism  Labs per orders Reviewed CT scan results with patient, she has opted to not check another CT unless she has concerns she knows to give [place] a call if she changes her mind and we would be happy to order a repeat CT in 12 to 18 months Lifestyle recommendations Annual wellness visit due, advised to schedule with Manuela Schwartz Follow-up with me in about 4 months -Patient advised to return or notify a doctor immediately if symptoms worsen or persist or new concerns arise.  Patient Instructions  BEFORE YOU LEAVE: -lab -follow up: AWV in next 1-2 months on a Tuesday with Manuela Schwartz; follow up Dr. Maudie Mercury in 4 months  We have ordered labs or studies at this visit. It can take up to 1-2 weeks for results and processing. IF results require follow up or explanation, we will call you with instructions. Clinically stable results will be released to your [facility]. If you have not heard from [place] or cannot find your results in [facility] in 2 weeks please contact our office at [phone].  If you are not yet signed up for [facility], please consider signing up.   We recommend the following healthy lifestyle for LIFE: 1) Small portions. But, make sure to get regular (at least 3 per day), healthy meals and small healthy snacks if needed.  2) Eat a healthy clean diet.   TRY TO EAT: -at least 5-7 servings of low sugar, colorful, and nutrient rich vegetables per day (not corn, potatoes or bananas.) -berries are the best choice if you wish to eat fruit (only eat small amounts if trying to reduce weight)  -lean meets (fish, white meat of chicken or [place]) -vegan proteins for some meals - beans or tofu, whole grains, nuts and seeds -Replace  bad fats with good fats - good fats include: fish, nuts and seeds, canola oil, olive oil -small amounts of low fat or non fat dairy -small amounts of100 % whole grains - check the lables -drink plenty of water  AVOID: -SUGAR, sweets, anything with added sugar, corn syrup or sweeteners - must read labels as even foods advertised as "healthy" often are loaded with sugar -if you must have a sweetener, small amounts of stevia may be best -sweetened beverages and artificially sweetened beverages -simple starches (rice, bread, potatoes, pasta, chips, etc - small amounts of 100% whole grains are ok) -red meat, pork, butter -fried foods, fast food, processed food, excessive dairy, eggs and coconut.  3)Get at least 150 minutes of sweaty aerobic exercise per week.  4)Reduce stress - consider counseling, meditation and relaxation to balance other aspects of your life.  Jams Trickett R Dantavious Snowball, DO   

## 2018-02-12 ENCOUNTER — Ambulatory Visit (INDEPENDENT_AMBULATORY_CARE_PROVIDER_SITE_OTHER): Payer: Medicare Other | Admitting: Family Medicine

## 2018-02-12 ENCOUNTER — Encounter: Payer: Self-pay | Admitting: Family Medicine

## 2018-02-12 VITALS — BP 120/80 | HR 72 | Temp 98.3°F | Ht 62.0 in | Wt 156.9 lb

## 2018-02-12 DIAGNOSIS — I10 Essential (primary) hypertension: Secondary | ICD-10-CM

## 2018-02-12 DIAGNOSIS — Z6828 Body mass index (BMI) 28.0-28.9, adult: Secondary | ICD-10-CM

## 2018-02-12 DIAGNOSIS — E038 Other specified hypothyroidism: Secondary | ICD-10-CM

## 2018-02-12 DIAGNOSIS — I700 Atherosclerosis of aorta: Secondary | ICD-10-CM | POA: Diagnosis not present

## 2018-02-12 DIAGNOSIS — R911 Solitary pulmonary nodule: Secondary | ICD-10-CM | POA: Diagnosis not present

## 2018-02-12 DIAGNOSIS — E7800 Pure hypercholesterolemia, unspecified: Secondary | ICD-10-CM

## 2018-02-12 DIAGNOSIS — R739 Hyperglycemia, unspecified: Secondary | ICD-10-CM

## 2018-02-12 NOTE — Patient Instructions (Addendum)
BEFORE YOU LEAVE: -lab -follow up: AWV in next 1-2 months on a Tuesday with Manuela Schwartz; follow up Dr. Maudie Mercury in 4 months  We have ordered labs or studies at this visit. It can take up to 1-2 weeks for results and processing. IF results require follow up or explanation, we will call you with instructions. Clinically stable results will be released to your [facility]. If you have not heard from [place] or cannot find your results in [facility] in 2 weeks please contact our office at [phone].  If you are not yet signed up for [facility] please consider signing up.   We recommend the following healthy lifestyle for LIFE: 1) Small portions. But, make sure to get regular (at least 3 per day), healthy meals and small healthy snacks if needed.  2) Eat a healthy clean diet.   TRY TO EAT: -at least 5-7 servings of low sugar, colorful, and nutrient rich vegetables per day (not corn, potatoes or bananas.) -berries are the best choice if you wish to eat fruit (only eat small amounts if trying to reduce weight)  -lean meets (fish, white meat of chicken or [place]) -vegan proteins for some meals - beans or tofu, whole grains, nuts and seeds -Replace bad fats with good fats - good fats include: fish, nuts and seeds, canola oil, olive oil -small amounts of low fat or non fat dairy -small amounts of100 % whole grains - check the lables -drink plenty of water  AVOID: -SUGAR, sweets, anything with added sugar, corn syrup or sweeteners - must read labels as even foods advertised as "healthy" often are loaded with sugar -if you must have a sweetener, small amounts of stevia may be best -sweetened beverages and artificially sweetened beverages -simple starches (rice, bread, potatoes, pasta, chips, etc - small amounts of 100% whole grains are ok) -red meat, pork, butter -fried foods, fast food, processed food, excessive dairy, eggs and coconut.  3)Get at least 150 minutes of sweaty aerobic exercise per week.  4)Reduce  stress - consider counseling, meditation and relaxation to balance other aspects of your life.

## 2018-03-13 ENCOUNTER — Other Ambulatory Visit: Payer: Self-pay | Admitting: Family Medicine

## 2018-03-13 DIAGNOSIS — Z1231 Encounter for screening mammogram for malignant neoplasm of breast: Secondary | ICD-10-CM

## 2018-03-29 DIAGNOSIS — L570 Actinic keratosis: Secondary | ICD-10-CM | POA: Diagnosis not present

## 2018-03-29 DIAGNOSIS — D225 Melanocytic nevi of trunk: Secondary | ICD-10-CM | POA: Diagnosis not present

## 2018-03-29 DIAGNOSIS — X32XXXD Exposure to sunlight, subsequent encounter: Secondary | ICD-10-CM | POA: Diagnosis not present

## 2018-03-29 DIAGNOSIS — L821 Other seborrheic keratosis: Secondary | ICD-10-CM | POA: Diagnosis not present

## 2018-05-13 NOTE — Progress Notes (Signed)
Subjective:   Felicia Kelly is a 82 y.o. female who presents for Medicare Annual (Subsequent) preventive examination.  Reports health as very good  Widow as of 2011;  Moved to [place] with her spouse many years [place] Not children but lots of niece's and nephew's   Diet  BMI 29  Chol/hdl 4;  A1c 6.1 Have breakfast every day; bagel; muffin Lunch; [employer]; soup and sandwich or a salad Dinner; eat out or other  [employer] a lot of fruit  Eats out a lot  [employer]   Exercise  Walks neighbors dog 3 times a week    There are no preventive care reminders to display for this patient.  Dexa; discussed   Colonoscopy 05/2015 Mammogram 07/2017 - every year and due in October  Dexa 02/2016 -1.2  Will start going back to the gym       Objective:     Vitals: BP 100/62   Pulse (!) 55   Ht 5\' 2"  (1.575 m)   Wt 159 lb 8 oz (72.3 kg)   SpO2 97%   BMI 29.17 kg/m   Body mass index is 29.17 kg/m.  Advanced Directives 05/14/2018 02/08/2017 06/03/2015  Does Patient Have a Medical Advance Directive? Yes Yes No  Type of Advance Directive - [place];Living will -  Does patient want to make changes to medical advance directive? - No - Patient declined -  Copy of [place] in Chart? - No - copy requested -  Would patient like information on creating a medical advance directive? - - No - patient declined information   Advanced Directive; Reviewed advanced directive and agreed to receipt of information and discussion.  Focused face to face x  20 minutes discussing HCPOA and Living will and reviewed all the questions in the [place] forms. The patient voices understanding of HCPOA; LW reviewed and information provided on each question. Educated on how to revoke this HCPOA or LW at any time.   Also  discussed life prolonging measures (given a few examples) and where she could choose to initiate or not;   the ability to given the HCPOA power to change her living will or not if she cannot speak for herself; as well as finalizing the will by 2 unrelated witnesses and notary.  Will call for questions and given information on [facility] pastoral department for further assistance.     Tobacco Social History   Tobacco Use  Smoking Status Former Smoker  . Last attempt to quit: 10/16/1978  . Years since quitting: 39.6  Smokeless Tobacco Never Used  Tobacco Comment   1963 when her father died;  smoked for a brief period of time     Counseling given: Yes Comment: 1963 when her father died;  smoked for a brief period of time   Clinical Intake:     Past Medical History:  Diagnosis Date  . Arthritis   . Colonic polyp   . Genital herpes   . Hyperlipidemia   . Hypertension   . Osteoporosis   . Thyroid disease    seeing Dr. Buddy Duty  . Tremor    Past Surgical History:  Procedure Laterality Date  . ABDOMINAL HYSTERECTOMY  1988   no cancer - reports total with bilat oophorectomy  . APPENDECTOMY  1988  . BREAST CYST EXCISION Bilateral   . BREAST SURGERY  1985   biopsy  .  FOOT SURGERY    . TOTAL HIP ARTHROPLASTY     Family History  Problem Relation Age of Onset  . Cancer Father        pancreatic  . Pancreatic cancer Father   . Heart disease Father   . Heart attack Sister 22  . Arthritis Mother   . Hyperlipidemia Mother   . Hypertension Mother   . Heart disease Mother   . Diabetes Mother   . Liver disease Brother   . Breast cancer Neg Hx    Social History   Socioeconomic History  . Marital status: Widowed    Spouse name: Not on file  . Number of children: Not on file  . Years of education: Not on file  . Highest education level: Not on file  Occupational History  . Not on file  Social Needs  . Financial resource strain: Not on file  . Food insecurity:    Worry: Not on file    Inability: Not on file  . Transportation needs:    Medical: Not on file    Non-medical: Not  on file  Tobacco Use  . Smoking status: Former Smoker    Last attempt to quit: 10/16/1978    Years since quitting: 39.6  . Smokeless tobacco: Never Used  . Tobacco comment: 1963 when her father died;  smoked for a brief period of time  Substance and Sexual Activity  . Alcohol use: No    Alcohol/week: 0.0 oz  . Drug use: No  . Sexual activity: Not on file  Lifestyle  . Physical activity:    Days per week: Not on file    Minutes per session: Not on file  . Stress: Not on file  Relationships  . Social connections:    Talks on phone: Not on file    Gets together: Not on file    Attends religious service: Not on file    Active member of club or organization: Not on file    Attends meetings of clubs or organizations: Not on file    Relationship status: Not on file  Other Topics Concern  . Not on file  Social History Narrative   Work or School: none      Home Situation: widower, lives alone      Spiritual Beliefs: Christian      Lifestyle: no regular exercise; diet is ok       Outpatient Encounter Medications as of 05/14/2018  Medication Sig  . ascorbic acid (VITAMIN C) 500 MG tablet Take 500 mg by mouth daily.  Marland Kitchen aspirin 81 MG tablet Take 81 mg by mouth daily.  Marland Kitchen atenolol (TENORMIN) 25 MG tablet TAKE 1/2 TABLETS (12.5 MG TOTAL) BY MOUTH DAILY.  . hydrochlorothiazide (HYDRODIURIL) 25 MG tablet 1/2 half tablet daily as needed for edema.  Marland Kitchen levothyroxine (SYNTHROID, LEVOTHROID) 50 MCG tablet Take 50 mcg by mouth daily before breakfast.  . Multiple Vitamin (MULTIVITAMIN) capsule Take 1 capsule by mouth daily.  . pravastatin (PRAVACHOL) 80 MG tablet TAKE 1 TABLET (80 MG TOTAL) BY MOUTH DAILY.  . valACYclovir (VALTREX) 500 MG tablet TAKE 1 TABLET (500 MG TOTAL) BY MOUTH DAILY.  . benzonatate (TESSALON) 100 MG capsule Take 1 capsule (100 mg total) by mouth 2 (two) times daily as needed for cough. (Patient not taking: Reported on 05/14/2018)  . losartan (COZAAR) 100 MG tablet Take 1  tablet (100 mg total) by mouth daily.  . [DISCONTINUED] nitrofurantoin, macrocrystal-monohydrate, (MACROBID) 100 MG capsule Take 1 capsule (  100 mg total) by mouth 2 (two) times daily.   No facility-administered encounter medications on file as of 05/14/2018.     Activities of Daily Living In your present state of health, do you have any difficulty performing the following activities: 05/14/2018  Hearing? N  Vision? N  Difficulty concentrating or making decisions? N  Walking or climbing stairs? N  Dressing or bathing? N  Doing errands, shopping? N  Some recent data might be hidden    Patient Care Team: [name], DO as PCP - General (Family Medicine) Kathie Rhodes, MD as Consulting Physician (Urology)    Assessment:   This is a routine wellness examination for [place].  Exercise Activities and Dietary recommendations    Goals    . Exercise 150 min/wk Moderate Activity     Will look for a gym to try a few weights; low and slow  May try the other community programs  The silver sneaker program for seniors        Fall Risk Fall Risk  05/14/2018 02/08/2017 02/08/2016 11/15/2015 10/06/2014  Falls in the past year? No No No No No    Depression Screen PHQ 2/9 Scores 05/14/2018 02/08/2017 02/08/2016 11/15/2015  PHQ - 2 Score 0 0 0 0     Cognitive Function MMSE - Mini Mental State Exam 05/14/2018  Orientation to time 5  Orientation to Place 5  Registration 3  Attention/ Calculation 4  Recall 2  Language- name 2 objects 2  Language- repeat 1  Language- follow 3 step command 3  Language- read & follow direction 1  Write a sentence 1  Copy design 1  Total score 28   Ad8 score reviewed for issues:  Issues making decisions:  Less interest in hobbies / activities:  Repeats questions, stories (family complaining):  Trouble using ordinary gadgets (microwave, computer, phone):  Forgets the month or year:   Mismanaging finances:   Remembering appts:  Daily problems  with thinking and/or memory: Ad8 score is=0       6CIT Screen 05/14/2018  What Year? 0 points  What month? 0 points  What time? 0 points  Count back from 20 0 points  Months in reverse 0 points  Repeat phrase 4 points  Total Score 4    Immunization History  Administered Date(s) Administered  . Influenza, High Dose Seasonal PF 08/12/2015, 06/22/2016, 05/30/2017  . Influenza-Unspecified 07/16/2014  . Pneumococcal Conjugate-13 10/06/2014  . Pneumococcal Polysaccharide-23 11/15/2015  . Td 10/03/2013  . Zoster 10/16/2002     Screening Tests Health Maintenance  Topic Date Due  . INFLUENZA VACCINE  05/16/2018  . TETANUS/TDAP  10/04/2023  . DEXA SCAN  Completed  . PNA vac Low Risk Adult  Completed         Plan:      PCP Notes   Health Maintenance   Colonoscopy 05/2015 Mammogram 07/2017 - every year and due in October  Dexa 02/2016 -1.2  Agreed to repeat dexa;  Bone density ordered and request per note the GI call Ms. Saline and schedule dexa with her mammogram 10/3  Educated regarding her shingrix   Will start going back to the gym  Will fup regarding her vision check  Given community exercise classes   HCPOA reviewed x 49minutes  States her sister will be her HCPOA   Abnormal Screens  MMSE 28/30  Was tired today. Recall was slow. Repeated plan for dexa several times; there seem to be confusion regard her meds.  recall  2/3; thinking somewhat concrete but also just drove to [facility]. Educated on decreasing sugar; ( pre-diabetes) exercise. Explained whatever is heart healthy is brain healthy! No issues with living independently   Referrals none  Patient concerns;  -Medications include:Aspirin 81 mg, atenolol 12.5 mg twice a day,losartan, hctz as needed for swellingand pravastatin  She is taking one 25 mg atenolol every day Taking HCTZ fluid prn;  She at first denied taking 3 BP meds; stated she only took one. Later, decided she did  take Losartan. Asked her to bring her meds in for her August visit with Dr. Maudie Mercury   Nurse Concerns; As noted   Next PCP apt Aug 29th.  I have reviewed the documentation for AWV and Advanced Care Planning provided by the Health Coach and agree with the documentation. I was immediately available for any questions.        I have personally reviewed and noted the following in the patient's chart:   . Medical and social history . Use of alcohol, tobacco or illicit drugs  . Current medications and supplements . Functional ability and status . Nutritional status . Physical activity . Advanced directives . List of other physicians . Hospitalizations, surgeries, and ER visits in previous 12 months . Vitals . Screenings to include cognitive, depression, and falls . Referrals and appointments  In addition, I have reviewed and discussed with patient certain preventive protocols, quality metrics, and best practice recommendations. A written personalized care plan for preventive services as well as general preventive health recommendations were provided to patient.     Wynetta Fines, RN  05/14/2018

## 2018-05-14 ENCOUNTER — Ambulatory Visit (INDEPENDENT_AMBULATORY_CARE_PROVIDER_SITE_OTHER): Payer: Medicare Other

## 2018-05-14 VITALS — BP 100/62 | HR 55 | Ht 62.0 in | Wt 159.5 lb

## 2018-05-14 DIAGNOSIS — Z0000 Encounter for general adult medical examination without abnormal findings: Secondary | ICD-10-CM | POA: Diagnosis not present

## 2018-05-14 DIAGNOSIS — M8580 Other specified disorders of bone density and structure, unspecified site: Secondary | ICD-10-CM | POA: Diagnosis not present

## 2018-05-14 NOTE — Patient Instructions (Addendum)
[name] , Thank you for taking time to come for your Medicare Wellness Visit. I appreciate your ongoing commitment to your health goals. Please review the following plan we discussed and let me know if I can assist you in the future.   You should get a call to repeat your mammogram in October You will see Dr. Maudie Kelly August 29th.  Please call and schedule DEXA or bone density with your mammogram Oct 3rd  Call the [place]   Make an apt with Dr. Kathlen Kelly for a vision check; generally, we like to do them every year If possible unless the doctor tells you otherwise   Review list of thing you can do to help your memory! Anything that helps the heart, helps the brain.   Shingrix is a vaccine for the prevention of Shingles in Adults 50 and older.  If you are on Medicare, the shingrix is covered under your Part D plan, so you will take both of the vaccines in the series at your pharmacy. Please check with your benefits regarding applicable copays or out of pocket expenses.  The Shingrix is given in 2 vaccines approx 8 weeks apart. You must receive the 2nd dose prior to 6 months from receipt of the first. Please have the pharmacist print out you Immunization  dates for our office records    Recommendations for Dexa Scan Female over the age of 52 Man age 32 or older If you broke a bone past the age of 61 Women menopausal age with risk factors (thin frame; smoker; hx of fx ) Post menopausal women under the age of 92 with risk factors A man age 66 to 1 with risk factors Other: Spine xray that is showing break of bone loss Back pain with possible break Height loss of 1/2 inch or more within one year Total loss in height of 1.5 inches from your original height  Calcium 1250m with Vit D 800u per day; more as directed by physician Strength building exercises discussed; can include walking; housework; small weights or stretch bands; silver sneakers if access to the Y  Please visit the  osteoporosis foundation.[email or web address] for up to date recommendations  These are the goals we discussed: Goals    . Exercise 150 min/wk Moderate Activity     Will look for a gym to try a few weights; low and slow  May try the other community programs  The silver sneaker program for seniors        This is a list of the screening recommended for you and due dates:  Health Maintenance  Topic Date Due  . Flu Shot  05/16/2018  . Tetanus Vaccine  10/04/2023  . DEXA scan (bone density measurement)  Completed  . Pneumonia vaccines  Completed   CManufacturing engineerof Services Cost  A Matter of Balance Class locations vary. Call PBentleyon Aging for more information.  [email or web address])482-64378-Session program addressing the fear of falling and increasing activity levels of older adults Free to minimal cost  A.C.T. By DThe [address] [place] [id number]  [phone] Personal training, gym, classes including Silver Sneakers* and ACTion for Aging Adults Fee-based  A.H.O.Y. (Add Health to OBenton Airs on Time [employer]13, M-F at [place] [employer]  3RosenhaynV[place]letonSportsplex 2[place]  [place],  [place], [place], [address] [facility], [place], [place], [place] [address]  [place] Location: Sharrell Ku. [employer] [place]      [phone]  [phone]  [phone]  [phone]  [phone]  [phone]  [phone]  [phone]  [phone]  [phone]    [phone] A total-body conditioning class for adults 47 and older; designed to increase muscular  strength, endurance, range of movement, flexibility, balance, agility and coordination Free  Annie Jeffrey [facility] [place], [place] [address] [place]      [address]      [phone]      Pilate's class for individualsreturning to exercise after an injury, before or after surgery or for individuals with complex musculoskeletal issues; designed to improve strength, balance , flexibility      $15/class  [place] [address] [place], [place] [address] [email or web address] [place] classes for beginners to advanced [place], [place] [address] [email or web address] [place] Chair Exercises Free, ages 65 and older; Ages 50-59 fee based  Marvia Pickles, [facility] [address] [place], [place] [address] [email or web address] [phone]  A.H.O.Y. Tai Chi Fee-based Donation based or free  [place] Class locations vary.  Call or email Angela Burke or view website for more information. [email or web address] [phone] Ongoing classes at local YMCAs and gyms Fee-based  Silver Sneakers A.C.T. By [place] Isabel Luther's [employer]: [place] [phone]  [phone] Classes designed for older adults who want to improve their strength, flexibility,  balance and endurance.   Silver sneakers is covered by some insurance plans and includes a fitness center membership at participating locations. Find out more by calling [phone] or visiting [email or web address] Covered by some insurance plans  [facility] [place] [phone] A.H.O.Y., fitness room, personal training, fitness classes for injury prevention, strength, balance, flexibility, water fitness classes Ages 55+: $50 for 6 months; Ages 30-54: $88 for 6 months  Tai Chi for Everybody [facility] [address] [place], [place] [address] [email or web address] [phone] Tai Chi classes for beginners to advanced; geared for seniors Donation Based      [employer] (Helpling Others Participate in Exercise     Loyal Gambler. Rosana Hoes, PhD, En[place] [email or web address] [place]     [phone]     A comprehensive fitness program for adults.  The program paris senior-level undergraduates Kinesiology students with adults who desire to learn how to exercise safely.  Includes a structural exercise class focusing on functional fitnesss     $100/semester in fall and spring; $75 in summer (no trainers)    *Silver Sneakers is covered by some Personal assistant and includes a  Radio producer at participating locations.  Find out more by calling [phone] or visiting [email or web address]  For additional health and human services resources for senior  adults, please contact SeniorLine at [phone] in [place] and [place] at [phone] in all other areas.    Bone Densitometry Bone densitometry is an imaging test that uses a special X-ray to measure the amount of calcium and other minerals in your bones (bone density). This test is also known as a bone mineral density test or dual-energy X-ray absorptiometry (DXA). The test can measure bone density at your hip and your spine. It is similar to having a regular X-ray. You  may have this test to:  Diagnose a condition that causes weak or thin bones (osteoporosis).  Predict your risk of a broken bone (fracture).  Determine how well osteoporosis treatment is working.  Tell a health care provider about:  Any allergies you have.  All medicines you are taking, including vitamins, herbs, eye drops, creams, and over-the-counter medicines.  Any problems you or family members have had with anesthetic medicines.  Any blood disorders you have.  Any surgeries you have had.  Any medical conditions you have.  Possibility of pregnancy.  Any other medical test you had within the previous 14 days that used contrast material. What are the risks? Generally, this is a safe procedure. However, problems can occur and may include the following:  This test exposes you to a very small amount of radiation.  The risks of radiation exposure may be greater to unborn children.  What happens before the procedure?  Do not take any calcium supplements for 24 hours before having the test. You can otherwise eat and drink what you usually do.  Take off all metal jewelry, eyeglasses, dental appliances, and any other metal objects. What happens during the procedure?  You may lie on an exam table. There will be an X-ray generator below you and an imaging device above you.  Other devices, such as boxes or braces, may be used to position your body properly for the scan.  You will need to lie still while the machine slowly scans your body.  The images will show up on a computer monitor. What happens after the procedure? You may need more testing at a later time. This information is not intended to replace advice given to you by your health care provider. Make sure you discuss any questions you have with your health care provider. Document Released: 10/24/2004 Document Revised: 03/09/2016 Document Reviewed: 03/12/2014 Elsevier Interactive Patient Education  2018 [place] in the Home Falls can cause injuries. They can happen to people of all ages. There are many things you can do to make your home safe and to help prevent falls. What can I do on the outside of my home?  Regularly fix the edges of walkways and driveways and fix any cracks.  Remove anything that might make you trip as you walk through a door, such as a raised step or threshold.  Trim any bushes or trees on the path to your home.  Use bright outdoor lighting.  Clear any walking paths of anything that might make someone trip, such as rocks or tools.  Regularly check to see if handrails are loose or broken. Make sure that both sides of any steps have handrails.  Any raised decks and porches should have guardrails on the edges.  Have any leaves, snow, or ice cleared regularly.  Use sand or salt on walking paths during winter.  Clean up any spills in your garage right away. This includes oil or grease spills. What can I do in  the bathroom?  Use night lights.  Install grab bars by the toilet and in the tub and shower. Do not use towel bars as grab bars.  Use non-skid mats or decals in the tub or shower.  If you need to sit down in the shower, use a plastic, non-slip stool.  Keep the floor dry. Clean up any water that spills on the floor as soon as it happens.  Remove soap buildup in the tub or shower regularly.  Attach bath mats securely with double-sided non-slip rug tape.  Do not have throw rugs and other things on the floor that can make you trip. What can I do in the bedroom?  Use night lights.  Make sure that you have a light by your bed that is easy to reach.  Do not use any sheets or blankets that are too big for your bed. They should not hang down onto the floor.  Have a firm chair that has side arms. You can use this for support while you get dressed.  Do not have throw rugs and other things on the floor that can make you trip. What can I  do in the kitchen?  Clean up any spills right away.  Avoid walking on wet floors.  Keep items that you use a lot in easy-to-reach places.  If you need to reach something above you, use a strong step stool that has a grab bar.  Keep electrical cords out of the way.  Do not use floor polish or wax that makes floors slippery. If you must use wax, use non-skid floor wax.  Do not have throw rugs and other things on the floor that can make you trip. What can I do with my stairs?  Do not leave any items on the stairs.  Make sure that there are handrails on both sides of the stairs and use them. Fix handrails that are broken or loose. Make sure that handrails are as long as the stairways.  Check any carpeting to make sure that it is firmly attached to the stairs. Fix any carpet that is loose or worn.  Avoid having throw rugs at the top or bottom of the stairs. If you do have throw rugs, attach them to the floor with carpet tape.  Make sure that you have a light switch at the top of the stairs and the bottom of the stairs. If you do not have them, ask someone to add them for you. What else can I do to help prevent falls?  Wear shoes that: ? Do not have high heels. ? Have rubber bottoms. ? Are comfortable and fit you well. ? Are closed at the toe. Do not wear sandals.  If you use a stepladder: ? Make sure that it is fully opened. Do not climb a closed stepladder. ? Make sure that both sides of the stepladder are locked into place. ? Ask someone to hold it for you, if possible.  Clearly mark and make sure that you can see: ? Any grab bars or handrails. ? First and last steps. ? Where the edge of each step is.  Use tools that help you move around (mobility aids) if they are needed. These include: ? Canes. ? Walkers. ? Scooters. ? Crutches.  Turn on the lights when you go into a dark area. Replace any light bulbs as soon as they burn out.  Set up your furniture so you have a  clear path. Avoid moving your furniture around.  If any of your floors are uneven, fix them.  If there are any pets around you, be aware of where they are.  Review your medicines with your doctor. Some medicines can make you feel dizzy. This can increase your chance of falling. Ask your doctor what other things that you can do to help prevent falls. This information is not intended to replace advice given to you by your health care provider. Make sure you discuss any questions you have with your health care provider. Document Released: 07/29/2009 Document Revised: 03/09/2016 Document Reviewed: 11/06/2014 Elsevier Interactive Patient Education  2018 [place] Maintenance, Female Adopting a healthy lifestyle and getting preventive care can go a long way to promote health and wellness. Talk with your health care provider about what schedule of regular examinations is right for you. This is a good chance for you to check in with your provider about disease prevention and staying healthy. In between checkups, there are plenty of things you can do on your own. Experts have done a lot of research about which lifestyle changes and preventive measures are most likely to keep you healthy. Ask your health care provider for more information. Weight and diet Eat a healthy diet  Be sure to include plenty of vegetables, fruits, low-fat dairy products, and lean protein.  Do not eat a lot of foods high in solid fats, added sugars, or salt.  Get regular exercise. This is one of the most important things you can do for your health. ? Most adults should exercise for at least 150 minutes each week. The exercise should increase your heart rate and make you sweat (moderate-intensity exercise). ? Most adults should also do strengthening exercises at least twice a week. This is in addition to the moderate-intensity exercise.  Maintain a healthy weight  Body mass index (BMI) is a measurement that can  be used to identify possible weight problems. It estimates body fat based on height and weight. Your health care provider can help determine your BMI and help you achieve or maintain a healthy weight.  For females 72 years of age and older: ? A BMI below 18.5 is considered underweight. ? A BMI of 18.5 to 24.9 is normal. ? A BMI of 25 to 29.9 is considered overweight. ? A BMI of 30 and above is considered obese.  Watch levels of cholesterol and blood lipids  You should start having your blood tested for lipids and cholesterol at 81 years of age, then have this test every 5 years.  You may need to have your cholesterol levels checked more often if: ? Your lipid or cholesterol levels are high. ? You are older than 80 years of age. ? You are at high risk for heart disease.  Cancer screening Lung Cancer  Lung cancer screening is recommended for adults 23-74 years old who are at high risk for lung cancer because of a history of smoking.  A yearly low-dose CT scan of the lungs is recommended for people who: ? Currently smoke. ? Have quit within the past 15 years. ? Have at least a 30-pack-year history of smoking. A pack year is smoking an average of one pack of cigarettes a day for 1 year.  Yearly screening should continue until it has been 15 years since you quit.  Yearly screening should stop if you develop a health problem that would prevent you from having lung cancer treatment.  Breast Cancer  Practice breast self-awareness. This means understanding how your  breasts normally appear and feel.  It also means doing regular breast self-exams. Let your health care provider know about any changes, no matter how small.  If you are in your 20s or 30s, you should have a clinical breast exam (CBE) by a health care provider every 1-3 years as part of a regular health exam.  If you are 38 or older, have a CBE every year. Also consider having a breast X-ray (mammogram) every year.  If you  have a family history of breast cancer, talk to your health care provider about genetic screening.  If you are at high risk for breast cancer, talk to your health care provider about having an MRI and a mammogram every year.  Breast cancer gene (BRCA) assessment is recommended for women who have family members with BRCA-related cancers. BRCA-related cancers include: ? Breast. ? Ovarian. ? Tubal. ? Peritoneal cancers.  Results of the assessment will determine the need for genetic counseling and BRCA1 and BRCA2 testing.  Cervical Cancer Your health care provider may recommend that you be screened regularly for cancer of the pelvic organs (ovaries, uterus, and vagina). This screening involves a pelvic examination, including checking for microscopic changes to the surface of your cervix (Pap test). You may be encouraged to have this screening done every 3 years, beginning at age 26.  For women ages 31-65, health care providers may recommend pelvic exams and Pap testing every 3 years, or they may recommend the Pap and pelvic exam, combined with testing for human papilloma virus (HPV), every 5 years. Some types of HPV increase your risk of cervical cancer. Testing for HPV may also be done on women of any age with unclear Pap test results.  Other health care providers may not recommend any screening for nonpregnant women who are considered low risk for pelvic cancer and who do not have symptoms. Ask your health care provider if a screening pelvic exam is right for you.  If you have had past treatment for cervical cancer or a condition that could lead to cancer, you need Pap tests and screening for cancer for at least 20 years after your treatment. If Pap tests have been discontinued, your risk factors (such as having a new sexual partner) need to be reassessed to determine if screening should resume. Some women have medical problems that increase the chance of getting cervical cancer. In these cases,  your health care provider may recommend more frequent screening and Pap tests.  Colorectal Cancer  This type of cancer can be detected and often prevented.  Routine colorectal cancer screening usually begins at 80 years of age and continues through 79 years of age.  Your health care provider may recommend screening at an earlier age if you have risk factors for colon cancer.  Your health care provider may also recommend using home test kits to check for hidden blood in the stool.  A small camera at the end of a tube can be used to examine your colon directly (sigmoidoscopy or colonoscopy). This is done to check for the earliest forms of colorectal cancer.  Routine screening usually begins at age 64.  Direct examination of the colon should be repeated every 5-10 years through 80 years of age. However, you may need to be screened more often if early forms of precancerous polyps or small growths are found.  Skin Cancer  Check your skin from head to toe regularly.  Tell your health care provider about any new moles or changes in  moles, especially if there is a change in a mole's shape or color.  Also tell your health care provider if you have a mole that is larger than the size of a pencil eraser.  Always use sunscreen. Apply sunscreen liberally and repeatedly throughout the day.  Protect yourself by wearing long sleeves, pants, a wide-brimmed hat, and sunglasses whenever you are outside.  Heart disease, diabetes, and high blood pressure  High blood pressure causes heart disease and increases the risk of stroke. High blood pressure is more likely to develop in: ? People who have blood pressure in the high end of the normal range (130-139/85-89 mm Hg). ? People who are overweight or obese. ? People who are African American.  If you are 73-74 years of age, have your blood pressure checked every 3-5 years. If you are 29 years of age or older, have your blood pressure checked every year.  You should have your blood pressure measured twice-once when you are at a hospital or clinic, and once when you are not at a hospital or clinic. Record the average of the two measurements. To check your blood pressure when you are not at a hospital or clinic, you can use: ? An automated blood pressure machine at a pharmacy. ? A home blood pressure monitor.  If you are between 31 years and 33 years old, ask your health care provider if you should take aspirin to prevent strokes.  Have regular diabetes screenings. This involves taking a blood sample to check your fasting blood sugar level. ? If you are at a normal weight and have a low risk for diabetes, have this test once every three years after 80 years of age. ? If you are overweight and have a high risk for diabetes, consider being tested at a younger age or more often. Preventing infection Hepatitis B  If you have a higher risk for hepatitis B, you should be screened for this virus. You are considered at high risk for hepatitis B if: ? You were born in a country where hepatitis B is common. Ask your health care provider which countries are considered high risk. ? Your parents were born in a high-risk country, and you have not been immunized against hepatitis B (hepatitis B vaccine). ? You have HIV or AIDS. ? You use needles to inject street drugs. ? You live with someone who has hepatitis B. ? You have had sex with someone who has hepatitis B. ? You get hemodialysis treatment. ? You take certain medicines for conditions, including cancer, organ transplantation, and autoimmune conditions.  Hepatitis C  Blood testing is recommended for: ? Everyone born from [birth year] through 1963. ? Anyone with known risk factors for hepatitis C.  Sexually transmitted infections (STIs)  You should be screened for sexually transmitted infections (STIs) including gonorrhea and chlamydia if: ? You are sexually active and are younger than 80 years of  age. ? You are older than 80 years of age and your health care provider tells you that you are at risk for this type of infection. ? Your sexual activity has changed since you were last screened and you are at an increased risk for chlamydia or gonorrhea. Ask your health care provider if you are at risk.  If you do not have HIV, but are at risk, it may be recommended that you take a prescription medicine daily to prevent HIV infection. This is called pre-exposure prophylaxis (PrEP). You are considered at risk if: ? You are  sexually active and do not regularly use condoms or know the HIV status of your partner(s). ? You take drugs by injection. ? You are sexually active with a partner who has HIV.  Talk with your health care provider about whether you are at high risk of being infected with HIV. If you choose to begin PrEP, you should first be tested for HIV. You should then be tested every 3 months for as long as you are taking PrEP. Pregnancy  If you are premenopausal and you may become pregnant, ask your health care provider about preconception counseling.  If you may become pregnant, take 400 to 800 micrograms (mcg) of folic acid every day.  If you want to prevent pregnancy, talk to your health care provider about birth control (contraception). Osteoporosis and menopause  Osteoporosis is a disease in which the bones lose minerals and strength with aging. This can result in serious bone fractures. Your risk for osteoporosis can be identified using a bone density scan.  If you are 102 years of age or older, or if you are at risk for osteoporosis and fractures, ask your health care provider if you should be screened.  Ask your health care provider whether you should take a calcium or vitamin D supplement to lower your risk for osteoporosis.  Menopause may have certain physical symptoms and risks.  Hormone replacement therapy may reduce some of these symptoms and risks. Talk to your health  care provider about whether hormone replacement therapy is right for you. Follow these instructions at home:  Schedule regular health, dental, and eye exams.  Stay current with your immunizations.  Do not use any tobacco products including cigarettes, chewing tobacco, or electronic cigarettes.  If you are pregnant, do not drink alcohol.  If you are breastfeeding, limit how much and how often you drink alcohol.  Limit alcohol intake to no more than 1 drink per day for nonpregnant women. One drink equals 12 ounces of beer, 5 ounces of wine, or 1 ounces of hard liquor.  Do not use street drugs.  Do not share needles.  Ask your health care provider for help if you need support or information about quitting drugs.  Tell your health care provider if you often feel depressed.  Tell your health care provider if you have ever been abused or do not feel safe at home. This information is not intended to replace advice given to you by your health care provider. Make sure you discuss any questions you have with your health care provider. Document Released: 04/17/2011 Document Revised: 03/09/2016 Document Reviewed: 07/06/2015 Elsevier Interactive Patient Education  [employer].

## 2018-05-14 NOTE — Progress Notes (Signed)
Adarryl Goldammer R Greco Gastelum, DO  

## 2018-06-04 DIAGNOSIS — Z23 Encounter for immunization: Secondary | ICD-10-CM | POA: Diagnosis not present

## 2018-06-11 ENCOUNTER — Other Ambulatory Visit: Payer: Self-pay | Admitting: Family Medicine

## 2018-06-12 NOTE — Progress Notes (Signed)
HPI:  Using dictation device. Unfortunately this device frequently misinterprets words/phrases.  Felicia Kelly Felicia Kelly is a pleasant 82 y.o. here for follow up. Chronic medical problems summarized below were reviewed for changes and stability and were updated as needed below. These issues and their treatment remain stable for the most part. Reports she is doing well. Is walking 1 mile 3-4 days per week to walk neighbors dog. Trying to eat healthy.  No reported CP, SOB, DOE, treatment intolerance or new symptoms. Taking the hctz as needed. Scheduled for mammo and dexa, due for labs  Hypertension, hyperlipidemia, BMI 28, atherosclerosis aorta: -Medications include:Aspirin 81 mg, atenolol 12.5 mg twice a day,losartan, hctz as needed for swellingand pravastatin  Hypothyroidism: -Sees endocrinologist for management, Felicia Kelly -On levothyroxine  Osteoporosis: -Doing vitamin D and weightbearing exercise -Last DEXA:02/2016  Genital herpes: -Suppressive therapy with Valtrex  History of pulmonary nodule: -repeat CT 01/2018 stable, with recs to consider one more CT in 12-18 months  ROS: See pertinent positives and negatives per HPI.  Past Medical History:  Diagnosis Date  . Arthritis   . Colonic polyp   . Genital herpes   . Hyperlipidemia   . Hypertension   . Osteoporosis   . Thyroid disease    seeing Felicia Kelly  . Tremor     Past Surgical History:  Procedure Laterality Date  . ABDOMINAL HYSTERECTOMY  1988   no cancer - reports total with bilat oophorectomy  . APPENDECTOMY  1988  . BREAST CYST EXCISION Bilateral   . BREAST SURGERY  1985   biopsy  . FOOT SURGERY    . TOTAL HIP ARTHROPLASTY      Family History  Problem Relation Age of Onset  . Cancer Father        pancreatic  . Pancreatic cancer Father   . Heart disease Father   . Heart attack Sister 46  . Arthritis Mother   . Hyperlipidemia Mother   . Hypertension Mother   . Heart disease Mother   . Diabetes  Mother   . Liver disease Brother   . Breast cancer Neg Hx     SOCIAL HX: see hpi   Current Outpatient Medications:  .  ascorbic acid (VITAMIN C) 500 MG tablet, Take 500 mg by mouth daily., Disp: , Rfl:  .  aspirin 81 MG tablet, Take 81 mg by mouth daily., Disp: , Rfl:  .  atenolol (TENORMIN) 25 MG tablet, TAKE 1/2 TABLETS (12.5 MG TOTAL) BY MOUTH DAILY., Disp: 45 tablet, Rfl: 5 .  benzonatate (TESSALON) 100 MG capsule, Take 1 capsule (100 mg total) by mouth 2 (two) times daily as needed for cough., Disp: 20 capsule, Rfl: 0 .  hydrochlorothiazide (HYDRODIURIL) 25 MG tablet, 1/2 half tablet daily as needed for edema., Disp: 90 tablet, Rfl: 3 .  levothyroxine (SYNTHROID, LEVOTHROID) 50 MCG tablet, Take 50 mcg by mouth daily before breakfast., Disp: , Rfl:  .  Multiple Vitamin (MULTIVITAMIN) capsule, Take 1 capsule by mouth daily., Disp: , Rfl:  .  pravastatin (PRAVACHOL) 80 MG tablet, TAKE 1 TABLET (80 MG TOTAL) BY MOUTH DAILY., Disp: 90 tablet, Rfl: 1 .  valACYclovir (VALTREX) 500 MG tablet, TAKE 1 TABLET (500 MG TOTAL) BY MOUTH DAILY., Disp: 90 tablet, Rfl: 1 .  losartan (COZAAR) 100 MG tablet, Take 1 tablet (100 mg total) by mouth daily., Disp: 90 tablet, Rfl: 3  EXAM:  Vitals:   06/13/18 1011  BP: 110/80  Pulse: 68  Temp: 97.7 F (36.5 C)  Body mass index is 29.01 kg/m.  GENERAL: vitals reviewed and listed above, alert, oriented, appears well hydrated and in no acute distress  HEENT: atraumatic, conjunttiva clear, no obvious abnormalities on inspection of external nose and ears  NECK: no obvious masses on inspection  LUNGS: clear to auscultation bilaterally, no wheezes, rales or rhonchi, good air movement  CV: HRRR, no peripheral edema  MS: moves all extremities without noticeable abnormality  PSYCH: pleasant and cooperative, no obvious depression or anxiety  ASSESSMENT AND PLAN:  Discussed the following assessment and plan:  Essential hypertension -dbp elevated  so she opted to take 1/2 tablet of the diuretic daily -she will monitor bp at home, if low, she will go back to prn -labs per orders  Aortic atherosclerosis (HCC) -cont current tx, risk reduction  Other specified hypothyroidism -sees specialist  Osteopenia, unspecified location -dexa ordered in system  -Patient advised to return or notify a doctor immediately if symptoms worsen or persist or new concerns arise.  There are no Patient Instructions on file for this visit.  Felicia Kern, DO

## 2018-06-13 ENCOUNTER — Encounter: Payer: Self-pay | Admitting: Family Medicine

## 2018-06-13 ENCOUNTER — Ambulatory Visit (INDEPENDENT_AMBULATORY_CARE_PROVIDER_SITE_OTHER): Payer: Medicare Other | Admitting: Family Medicine

## 2018-06-13 VITALS — BP 110/80 | HR 68 | Temp 97.7°F | Ht 62.0 in | Wt 158.6 lb

## 2018-06-13 DIAGNOSIS — M8580 Other specified disorders of bone density and structure, unspecified site: Secondary | ICD-10-CM

## 2018-06-13 DIAGNOSIS — I700 Atherosclerosis of aorta: Secondary | ICD-10-CM | POA: Diagnosis not present

## 2018-06-13 DIAGNOSIS — R739 Hyperglycemia, unspecified: Secondary | ICD-10-CM

## 2018-06-13 DIAGNOSIS — E038 Other specified hypothyroidism: Secondary | ICD-10-CM | POA: Diagnosis not present

## 2018-06-13 DIAGNOSIS — I10 Essential (primary) hypertension: Secondary | ICD-10-CM

## 2018-06-13 LAB — BASIC METABOLIC PANEL
BUN: 23 mg/dL (ref 6–23)
CO2: 30 mEq/L (ref 19–32)
Calcium: 9.5 mg/dL (ref 8.4–10.5)
Chloride: 105 mEq/L (ref 96–112)
Creatinine, Ser: 0.99 mg/dL (ref 0.40–1.20)
GFR: 57.31 mL/min — ABNORMAL LOW (ref >60.00)
Glucose, Bld: 98 mg/dL (ref 70–99)
Potassium: 4.1 mEq/L (ref 3.5–5.1)
Sodium: 141 mEq/L (ref 135–145)

## 2018-06-13 LAB — CBC
HCT: 39.6 % (ref 36.0–46.0)
Hemoglobin: 13.3 g/dL (ref 12.0–15.0)
MCHC: 33.5 g/dL (ref 30.0–36.0)
MCV: 89.7 fl (ref 78.0–100.0)
Platelets: 280 10*3/uL (ref 150.0–400.0)
RBC: 4.42 Mil/uL (ref 3.87–5.11)
RDW: 13.9 % (ref 11.5–15.5)
WBC: 8.2 10*3/uL (ref 4.0–10.5)

## 2018-06-13 LAB — HEMOGLOBIN A1C: Hgb A1c MFr Bld: 6.2 % (ref 4.6–6.5)

## 2018-06-13 NOTE — Patient Instructions (Signed)
BEFORE YOU LEAVE: -labs -follow up: 3 months  Try one half tablet of the hydrochlorothiazide daily.  We have ordered labs or studies at this visit. It can take up to 1-2 weeks for results and processing. IF results require follow up or explanation, we will call you with instructions. Clinically stable results will be released to your [facility]. If you have not heard from [place] or cannot find your results in [facility] in 2 weeks please contact our office at [phone].  If you are not yet signed up for [facility], please consider signing up.

## 2018-06-14 ENCOUNTER — Other Ambulatory Visit: Payer: Self-pay | Admitting: Cardiology

## 2018-06-14 DIAGNOSIS — I739 Peripheral vascular disease, unspecified: Principal | ICD-10-CM

## 2018-06-14 DIAGNOSIS — I779 Disorder of arteries and arterioles, unspecified: Secondary | ICD-10-CM

## 2018-06-14 DIAGNOSIS — I10 Essential (primary) hypertension: Secondary | ICD-10-CM

## 2018-06-15 ENCOUNTER — Other Ambulatory Visit: Payer: Self-pay | Admitting: Family Medicine

## 2018-07-05 ENCOUNTER — Other Ambulatory Visit: Payer: Self-pay | Admitting: Cardiology

## 2018-07-18 ENCOUNTER — Ambulatory Visit
Admission: RE | Admit: 2018-07-18 | Discharge: 2018-07-18 | Disposition: A | Discharge status: Home / Self Care | Payer: Medicare Other | Source: Ambulatory Visit | Attending: Family Medicine | Admitting: Family Medicine

## 2018-07-18 ENCOUNTER — Ambulatory Visit: Payer: Medicare Other

## 2018-07-18 ENCOUNTER — Other Ambulatory Visit: Payer: Self-pay | Admitting: Family Medicine

## 2018-07-18 DIAGNOSIS — M85832 Other specified disorders of bone density and structure, left forearm: Secondary | ICD-10-CM | POA: Diagnosis not present

## 2018-07-18 DIAGNOSIS — Z780 Asymptomatic menopausal state: Secondary | ICD-10-CM | POA: Diagnosis not present

## 2018-07-18 DIAGNOSIS — Z1231 Encounter for screening mammogram for malignant neoplasm of breast: Secondary | ICD-10-CM

## 2018-07-18 DIAGNOSIS — M8580 Other specified disorders of bone density and structure, unspecified site: Secondary | ICD-10-CM

## 2018-07-27 ENCOUNTER — Other Ambulatory Visit: Payer: Self-pay | Admitting: Cardiology

## 2018-07-27 DIAGNOSIS — I779 Disorder of arteries and arterioles, unspecified: Secondary | ICD-10-CM

## 2018-07-27 DIAGNOSIS — I739 Peripheral vascular disease, unspecified: Principal | ICD-10-CM

## 2018-07-27 DIAGNOSIS — I10 Essential (primary) hypertension: Secondary | ICD-10-CM

## 2018-09-02 ENCOUNTER — Ambulatory Visit: Payer: Medicare Other | Admitting: Cardiology

## 2018-09-17 ENCOUNTER — Ambulatory Visit (INDEPENDENT_AMBULATORY_CARE_PROVIDER_SITE_OTHER): Payer: Medicare Other | Admitting: Family Medicine

## 2018-09-17 ENCOUNTER — Encounter: Payer: Self-pay | Admitting: Family Medicine

## 2018-09-17 VITALS — BP 110/78 | HR 62 | Temp 98.4°F | Ht 62.0 in | Wt 156.4 lb

## 2018-09-17 DIAGNOSIS — I10 Essential (primary) hypertension: Secondary | ICD-10-CM | POA: Diagnosis not present

## 2018-09-17 DIAGNOSIS — M8580 Other specified disorders of bone density and structure, unspecified site: Secondary | ICD-10-CM | POA: Diagnosis not present

## 2018-09-17 DIAGNOSIS — R739 Hyperglycemia, unspecified: Secondary | ICD-10-CM | POA: Diagnosis not present

## 2018-09-17 DIAGNOSIS — E038 Other specified hypothyroidism: Secondary | ICD-10-CM

## 2018-09-17 DIAGNOSIS — E7800 Pure hypercholesterolemia, unspecified: Secondary | ICD-10-CM | POA: Diagnosis not present

## 2018-09-17 DIAGNOSIS — I700 Atherosclerosis of aorta: Secondary | ICD-10-CM

## 2018-09-17 DIAGNOSIS — E663 Overweight: Secondary | ICD-10-CM | POA: Diagnosis not present

## 2018-09-17 LAB — TSH: TSH: 1.45 u[IU]/mL (ref 0.35–4.50)

## 2018-09-17 LAB — BASIC METABOLIC PANEL
BUN: 18 mg/dL (ref 6–23)
CO2: 29 mEq/L (ref 19–32)
Calcium: 9.8 mg/dL (ref 8.4–10.5)
Chloride: 102 mEq/L (ref 96–112)
Creatinine, Ser: 0.84 mg/dL (ref 0.40–1.20)
GFR: 69.23 mL/min (ref >60.00)
Glucose, Bld: 108 mg/dL — ABNORMAL HIGH (ref 70–99)
Potassium: 3.5 mEq/L (ref 3.5–5.1)
Sodium: 141 mEq/L (ref 135–145)

## 2018-09-17 LAB — CBC
HCT: 40.9 % (ref 36.0–46.0)
Hemoglobin: 13.7 g/dL (ref 12.0–15.0)
MCHC: 33.6 g/dL (ref 30.0–36.0)
MCV: 89.1 fl (ref 78.0–100.0)
Platelets: 287 10*3/uL (ref 150.0–400.0)
RBC: 4.59 Mil/uL (ref 3.87–5.11)
RDW: 13.9 % (ref 11.5–15.5)
WBC: 6.9 10*3/uL (ref 4.0–10.5)

## 2018-09-17 NOTE — Patient Instructions (Signed)
BEFORE YOU LEAVE: -lab -follow up: 3-4 months  We have ordered labs or studies at this visit. It can take up to 1-2 weeks for results and processing. IF results require follow up or explanation, we will call you with instructions. Clinically stable results will be released to your [facility]. If you have not heard from [place] or cannot find your results in [facility] in 2 weeks please contact our office at [phone].  If you are not yet signed up for [facility], please consider signing up.   We recommend the following healthy lifestyle for LIFE: 1) Small portions. But, make sure to get regular (at least 3 per day), healthy meals and small healthy snacks if needed.  2) Eat a healthy clean diet.   TRY TO EAT: -at least 5-7 servings of low sugar, colorful, and nutrient rich vegetables per day (not corn, potatoes or bananas.) -berries are the best choice if you wish to eat fruit (only eat small amounts if trying to reduce weight)  -lean meets (fish, white meat of chicken or [place]) -vegan proteins for some meals - beans or tofu, whole grains, nuts and seeds -Replace bad fats with good fats - good fats include: fish, nuts and seeds, canola oil, olive oil -small amounts of low fat or non fat dairy -small amounts of100 % whole grains - check the lables -drink plenty of water  AVOID: -SUGAR, sweets, anything with added sugar, corn syrup or sweeteners - must read labels as even foods advertised as "healthy" often are loaded with sugar -if you must have a sweetener, small amounts of stevia may be best -sweetened beverages and artificially sweetened beverages -simple starches (rice, bread, potatoes, pasta, chips, etc - small amounts of 100% whole grains are ok) -red meat, pork, butter -fried foods, fast food, processed food, excessive dairy, eggs and coconut.  3)Get at least 150 minutes of sweaty aerobic exercise per week.  4)Reduce stress - consider counseling, meditation and relaxation to balance other  aspects of your life.

## 2018-09-17 NOTE — Progress Notes (Signed)
HPI:  Using dictation device. Unfortunately this device frequently misinterprets words/phrases.  Felicia Kelly is a pleasant 82 y.o. here for follow up. Chronic medical problems summarized below were reviewed for changes and stability and were updated as needed below. These issues and their treatment remain stable for the most part.  She had her bone density test since her last visit.  This should osteopenia.  No significant change.  She also completed her mammogram. Has appointment with her endocrinologist in January for her hypothyroidism. She wonders if she can do her tsh checks here moving forward. Denies any hx thyroid cancer or active monitroing of nodules or goiter. Reports stable levels for a long time. Denies CP, SOB, DOE, treatment intolerance or new symptoms.  AWV 05/14/18  Hypertension, hyperlipidemia, BMI 28, atherosclerosis aorta: -Medications include:Aspirin 81 mg, atenolol 12.5 mg twice a day,losartan, hctz as needed for swellingand pravastatin -Lost a few pounds since her last visit, weight down to 156 BMI of 28 09/2018  Hypothyroidism: -Sees endocrinologist for management, Dr.Kerr -On levothyroxine  Osteopenia: -Doing vitamin D and weightbearing exercise -Last DEXA:09/2018  Genital herpes: -Suppressive therapy with Valtrex  History of pulmonary nodule: -repeat CT 01/2018 stable, with recs to consider one more CT in 12-18 months  ROS: See pertinent positives and negatives per HPI.  Past Medical History:  Diagnosis Date  . Arthritis   . Colonic polyp   . Genital herpes   . Hyperlipidemia   . Hypertension   . Osteoporosis   . Thyroid disease    seeing Dr. Buddy Duty  . Tremor     Past Surgical History:  Procedure Laterality Date  . ABDOMINAL HYSTERECTOMY  1988   no cancer - reports total with bilat oophorectomy  . APPENDECTOMY  1988  . BREAST CYST EXCISION Bilateral   . BREAST SURGERY  1985   biopsy  . FOOT SURGERY    . TOTAL HIP  ARTHROPLASTY      Family History  Problem Relation Age of Onset  . Cancer Father        pancreatic  . Pancreatic cancer Father   . Heart disease Father   . Heart attack Sister 81  . Arthritis Mother   . Hyperlipidemia Mother   . Hypertension Mother   . Heart disease Mother   . Diabetes Mother   . Liver disease Brother   . Breast cancer Neg Hx     SOCIAL HX: see hpi   Current Outpatient Medications:  .  ascorbic acid (VITAMIN C) 500 MG tablet, Take 500 mg by mouth daily., Disp: , Rfl:  .  aspirin 81 MG tablet, Take 81 mg by mouth daily., Disp: , Rfl:  .  atenolol (TENORMIN) 25 MG tablet, TAKE 1/2 TABLETS (12.5 MG TOTAL) BY MOUTH DAILY., Disp: 45 tablet, Rfl: 5 .  benzonatate (TESSALON) 100 MG capsule, Take 1 capsule (100 mg total) by mouth 2 (two) times daily as needed for cough., Disp: 20 capsule, Rfl: 0 .  hydrochlorothiazide (HYDRODIURIL) 25 MG tablet, Take 0.5 tablets (12.5 mg total) by mouth daily. Please keep upcoming appt with Dr. [name] in November for future refills. Thank you, Disp: 45 tablet, Rfl: 0 .  levothyroxine (SYNTHROID, LEVOTHROID) 50 MCG tablet, Take 50 mcg by mouth daily before breakfast., Disp: , Rfl:  .  losartan (COZAAR) 100 MG tablet, TAKE 1 TABLET BY MOUTH EVERY DAY, Disp: 90 tablet, Rfl: 0 .  Multiple Vitamin (MULTIVITAMIN) capsule, Take 1 capsule by mouth daily., Disp: , Rfl:  .  pravastatin (PRAVACHOL) 80 MG tablet, TAKE 1 TABLET (80 MG TOTAL) BY MOUTH DAILY., Disp: 90 tablet, Rfl: 1 .  valACYclovir (VALTREX) 500 MG tablet, TAKE 1 TABLET (500 MG TOTAL) BY MOUTH DAILY., Disp: 90 tablet, Rfl: 1  EXAM:  Vitals:   09/17/18 1043  BP: 110/78  Pulse: 62  Temp: 98.4 F (36.9 C)    Body mass index is 28.61 kg/m.  GENERAL: vitals reviewed and listed above, alert, oriented, appears well hydrated and in no acute distress  HEENT: atraumatic, conjunttiva clear, no obvious abnormalities on inspection of external nose and ears  NECK: no obvious masses on  inspection  LUNGS: clear to auscultation bilaterally, no wheezes, rales or rhonchi, good air movement  CV: HRRR, no peripheral edema  MS: moves all extremities without noticeable abnormality  PSYCH: pleasant and cooperative, no obvious depression or anxiety  ASSESSMENT AND PLAN:  Discussed the following assessment and plan:  Hyperglycemia  Essential hypertension - Plan: Basic metabolic panel, CBC  Other specified hypothyroidism - Plan: TSH  Aortic atherosclerosis (HCC)  Osteopenia, unspecified location  Pure hypercholesterolemia  Overweight (BMI 25.0-29.9)  -labs per orders, we will go ahead and check her thyroid here -She plans to follow-up 1 more time with her specialist about her thyroid, but then to subsequent follow-up here -Lifestyle recommendations, recommend a healthy diet and regular exercise -Follow-up 3 to 4 months -Patient advised to return sooner if new concerns arise.  Patient Instructions  BEFORE YOU LEAVE: -lab -follow up: 3-4 months  We have ordered labs or studies at this visit. It can take up to 1-2 weeks for results and processing. IF results require follow up or explanation, we will call you with instructions. Clinically stable results will be released to your [facility]. If you have not heard from [place] or cannot find your results in [facility] in 2 weeks please contact our office at [phone].  If you are not yet signed up for [facility], please consider signing up.   We recommend the following healthy lifestyle for LIFE: 1) Small portions. But, make sure to get regular (at least 3 per day), healthy meals and small healthy snacks if needed.  2) Eat a healthy clean diet.   TRY TO EAT: -at least 5-7 servings of low sugar, colorful, and nutrient rich vegetables per day (not corn, potatoes or bananas.) -berries are the best choice if you wish to eat fruit (only eat small amounts if trying to reduce weight)  -lean meets (fish, white meat of chicken or  [place]) -vegan proteins for some meals - beans or tofu, whole grains, nuts and seeds -Replace bad fats with good fats - good fats include: fish, nuts and seeds, canola oil, olive oil -small amounts of low fat or non fat dairy -small amounts of100 % whole grains - check the lables -drink plenty of water  AVOID: -SUGAR, sweets, anything with added sugar, corn syrup or sweeteners - must read labels as even foods advertised as "healthy" often are loaded with sugar -if you must have a sweetener, small amounts of stevia may be best -sweetened beverages and artificially sweetened beverages -simple starches (rice, bread, potatoes, pasta, chips, etc - small amounts of 100% whole grains are ok) -red meat, pork, butter -fried foods, fast food, processed food, excessive dairy, eggs and coconut.  3)Get at least 150 minutes of sweaty aerobic exercise per week.  4)Reduce stress - consider counseling, meditation and relaxation to balance other aspects of your life.  Felicia Coppens R Rodriquez Thorner, DO   

## 2018-10-03 ENCOUNTER — Other Ambulatory Visit: Payer: Self-pay | Admitting: Cardiology

## 2018-10-05 ENCOUNTER — Encounter: Payer: Self-pay | Admitting: Physician Assistant

## 2018-10-05 NOTE — Progress Notes (Deleted)
Cardiology Office Note    Date:  10/05/2018  ID:  [name], [name] 10-19-1935, MRN [id number] PCP:  [name], DO  Cardiologist:  Ena Dawley, MD   Chief Complaint: 1 year f/u of carotid disease, also will discuss coronary atherosclerosis  History of Present Illness:  Felicia Kelly is a 82 y.o. female with history of carotid disease, coronary artery disease on CT scan 01/2018, hypertension, hyperlipidemia, pulmonary nodule, thyroid disease who presents for 1 year follow-up. She previously established care with Dr. Meda Kelly for DOE in setting of family history of heart disease. Her brother died of sudden cardiac death in the settings of myocardial infarction and stroke. Her sister was also my patient had myocardial infarction at the age of 79. The patient moved here from [place]. Screening nuclear stress test 2017 was normal, EF 61%. Carotid duplex 07/2017 showed 1-39% BICA, possible multinodular goiter in R thyroid. CT chest in 01/2018 to follow up pulmonary nodules by PCP showed similar appearance of pulmonary nodules suggesting benign etiology with planned continued follow-up, R sided thyroid nodule, coronary atherosclerosis and aortic atherosclerosis. Last labs 09/2018 showed normal CBC, BMET, glucose mildly elevated, 05/2018 A1C 6.2, 09/2017 LDL 94, LFTs 2015 wnl.   no recent LFTs. LDL too high, 94  stronger statin eval with dedicated CT?   Coronary artery disease on CT scan 01/2018 Mild carotid disease Essential HTN Hyperlipidemia     Past Medical History:  Diagnosis Date  . Aortic atherosclerosis (Felicia Kelly)    a. noted on CT 01/2018.  . Arthritis   . Colonic polyp   . Coronary atherosclerosis    a. noted on CT 01/2018.  Marland Kitchen Genital herpes   . Hyperlipidemia   . Hypertension   . Mild carotid artery disease (Felicia Kelly)   . Osteoporosis   . Thyroid disease    seeing Dr. Buddy Duty  . Tremor     Past Surgical History:  Procedure Laterality Date  . ABDOMINAL HYSTERECTOMY   1988   no cancer - reports total with bilat oophorectomy  . APPENDECTOMY  1988  . BREAST CYST EXCISION Bilateral   . BREAST SURGERY  1985   biopsy  . FOOT SURGERY    . TOTAL HIP ARTHROPLASTY      Current Medications: No outpatient medications have been marked as taking for the 10/07/18 encounter (Appointment) with Charlie Pitter, PA-C.   ***   Allergies:   Bactrim [sulfamethoxazole-trimethoprim]; Celebrex [celecoxib]; Crestor [rosuvastatin]; Lipitor [atorvastatin]; Niaspan [niacin er]; Tetracyclines & related; Zocor [simvastatin]; and Penicillins   Social History   Socioeconomic History  . Marital status: Widowed    Spouse name: Not on file  . Number of children: Not on file  . Years of education: Not on file  . Highest education level: Not on file  Occupational History  . Not on file  Social Needs  . Financial resource strain: Not on file  . Food insecurity:    Worry: Not on file    Inability: Not on file  . Transportation needs:    Medical: Not on file    Non-medical: Not on file  Tobacco Use  . Smoking status: Former Smoker    Last attempt to quit: 10/16/1978    Years since quitting: 39.9  . Smokeless tobacco: Never Used  . Tobacco comment: 1963 when her father died;  smoked for a brief period of time  Substance and Sexual Activity  . Alcohol use: No    Alcohol/week: 0.0 standard drinks  .  Drug use: No  . Sexual activity: Not on file  Lifestyle  . Physical activity:    Days per week: Not on file    Minutes per session: Not on file  . Stress: Not on file  Relationships  . Social connections:    Talks on phone: Not on file    Gets together: Not on file    Attends religious service: Not on file    Active member of club or organization: Not on file    Attends meetings of clubs or organizations: Not on file    Relationship status: Not on file  Other Topics Concern  . Not on file  Social History Narrative   Work or School: none      Home Situation: widower,  lives alone      Spiritual Beliefs: Christian      Lifestyle: no regular exercise; diet is ok        Family History:  The patient's ***family history includes Arthritis in her mother; Cancer in her father; Diabetes in her mother; Heart attack (age of onset: 47) in her sister; Heart disease in her father and mother; Hyperlipidemia in her mother; Hypertension in her mother; Liver disease in her brother; Pancreatic cancer in her father. There is no history of Breast cancer.  ROS:   Please see the history of present illness. Otherwise, review of systems is positive for ***.  All other systems are reviewed and otherwise negative.    PHYSICAL EXAM:   VS:  There were no vitals taken for this visit.  BMI: There is no height or weight on file to calculate BMI. GEN: Well nourished, well developed, in no acute distress HEENT: normocephalic, atraumatic Neck: no JVD, carotid bruits, or masses Cardiac: ***RRR; no murmurs, rubs, or gallops, no edema  Respiratory:  clear to auscultation bilaterally, normal work of breathing GI: soft, nontender, nondistended, + BS MS: no deformity or atrophy Skin: warm and dry, no rash Neuro:  Alert and Oriented x 3, Strength and sensation are intact, follows commands Psych: euthymic mood, full affect  Wt Readings from Last 3 Encounters:  09/17/18 156 lb 6.4 oz (70.9 kg)  06/13/18 158 lb 9.6 oz (71.9 kg)  05/14/18 159 lb 8 oz (72.3 kg)      Studies/Labs Reviewed:   EKG:  EKG was ordered today and personally reviewed by me and demonstrates *** EKG was not ordered today.***  Recent Labs: 09/17/2018: BUN 18; Creatinine, Ser 0.84; Hemoglobin 13.7; Platelets 287.0; Potassium 3.5; Sodium 141; TSH 1.45   Lipid Panel    Component Value Date/Time   CHOL 177 09/28/2017 0908   TRIG 197.0 (H) 09/28/2017 0908   HDL 43.30 09/28/2017 0908   CHOLHDL 4 09/28/2017 0908   VLDL 39.4 09/28/2017 0908   LDLCALC 94 09/28/2017 0908    Additional studies/ records that  were reviewed today include: Summarized above.***    ASSESSMENT & PLAN:   1. ***  Disposition: F/u with ***   Medication Adjustments/Labs and Tests Ordered: Current medicines are reviewed at length with the patient today.  Concerns regarding medicines are outlined above. Medication changes, Labs and Tests ordered today are summarized above and listed in the Patient Instructions accessible in Encounters.   Signed, Charlie Pitter, PA-C  10/05/2018 9:24 AM    [facility] [address], [place], [place]  [address] Phone: [phone]; Fax: [phone]

## 2018-10-07 ENCOUNTER — Ambulatory Visit: Payer: Medicare Other | Admitting: Physician Assistant

## 2018-11-18 ENCOUNTER — Other Ambulatory Visit: Payer: Self-pay | Admitting: Family Medicine

## 2018-12-10 ENCOUNTER — Other Ambulatory Visit: Payer: Self-pay | Admitting: Family Medicine

## 2018-12-29 ENCOUNTER — Other Ambulatory Visit: Payer: Self-pay | Admitting: Cardiology

## 2019-01-13 ENCOUNTER — Telehealth: Payer: Self-pay | Admitting: *Deleted

## 2019-01-13 NOTE — Telephone Encounter (Signed)
I called the pt and gave her info for the Webex visit on 4/2.

## 2019-01-13 NOTE — Telephone Encounter (Signed)
Copied from [place] [id number]. Topic: General - Other >> Jan 10, 2019  4:03 PM Richardo Priest, NT wrote: Reason for CRM:  Patient had called back and notified that the webex visit for her upcoming appointment will work just fine

## 2019-01-16 ENCOUNTER — Ambulatory Visit (INDEPENDENT_AMBULATORY_CARE_PROVIDER_SITE_OTHER): Payer: Medicare Other | Admitting: Family Medicine

## 2019-01-16 ENCOUNTER — Other Ambulatory Visit: Payer: Self-pay

## 2019-01-16 DIAGNOSIS — E038 Other specified hypothyroidism: Secondary | ICD-10-CM

## 2019-01-16 DIAGNOSIS — I739 Peripheral vascular disease, unspecified: Secondary | ICD-10-CM

## 2019-01-16 DIAGNOSIS — I700 Atherosclerosis of aorta: Secondary | ICD-10-CM

## 2019-01-16 DIAGNOSIS — I10 Essential (primary) hypertension: Secondary | ICD-10-CM | POA: Diagnosis not present

## 2019-01-16 DIAGNOSIS — I779 Disorder of arteries and arterioles, unspecified: Secondary | ICD-10-CM

## 2019-01-16 MED ORDER — HYDROCHLOROTHIAZIDE 25 MG PO TABS: 12.5000 mg | ORAL_TABLET | Freq: Every day | ORAL | 1 refills | Status: DC

## 2019-01-16 NOTE — Progress Notes (Signed)
Virtual Visit via Video Note  I connected with Felicia Kelly on 01/16/19 at 10:15 AM EDT by a video enabled telemedicine application and verified that I am speaking with the correct person using two identifiers.  Location patient: home Location provider:work or home office Persons participating in the virtual visit: patient, provider  I discussed the limitations of evaluation and management by telemedicine and the availability of in person appointments. The patient expressed understanding and agreed to proceed.   HPI:  Felicia Kelly is a pleasant 83 y.o. here for follow up. Chronic medical problems summarized below were reviewed for changes and stability and were updated as needed below. These issues and their treatment remain stable for the most part.  She has a new concern of mild depression. Has lived alone for a long time. Increased stress with the COVID19 situation. She does not feel needs any treatment at this time. She has less stress then others she knows and she has strong faith.She reports her BP is 120/76-80. No headaches, fevers, dizziness.  Denies CP, SOB, DOE, treatment intolerance or new symptoms.  AWV 05/14/18  Hypertension, hyperlipidemia, BMI 28, atherosclerosis aorta: -Medications include:Aspirin 81 mg, atenolol 12.5 mg twice a day,losartan, hctz as needed for swellingand pravastatin -Lost a few pounds since her last visit, weight down to 156 BMI of 28 09/2018  Hypothyroidism: -Sees endocrinologist for management, Dr.Kerr -On levothyroxine  Osteopenia: -Doing vitamin D and weightbearing exercise -Last DEXA:09/2018  Genital herpes: -Suppressive therapy with Valtrex  History of pulmonary nodule: -repeat CT 01/2018 stable, with recs to consider one more CT in 12-18 months  ROS: See pertinent positives and negatives per HPI.  Past Medical History:  Diagnosis Date  . Aortic atherosclerosis ([place])    a. noted on CT 01/2018.  . Arthritis   . Colonic polyp    . Coronary atherosclerosis    a. noted on CT 01/2018.  Marland Kitchen Genital herpes   . Hyperlipidemia   . Hypertension   . Mild carotid artery disease ([place])   . Osteoporosis   . Pulmonary nodules    a. folloewd by PCP by serial Ct.  . Thyroid disease    seeing Dr. Buddy Duty  . Tremor     Past Surgical History:  Procedure Laterality Date  . ABDOMINAL HYSTERECTOMY  1988   no cancer - reports total with bilat oophorectomy  . APPENDECTOMY  1988  . BREAST CYST EXCISION Bilateral   . BREAST SURGERY  1985   biopsy  . FOOT SURGERY    . TOTAL HIP ARTHROPLASTY      Family History  Problem Relation Age of Onset  . Cancer Father        pancreatic  . Pancreatic cancer Father   . Heart disease Father   . Heart attack Sister 85  . Arthritis Mother   . Hyperlipidemia Mother   . Hypertension Mother   . Heart disease Mother   . Diabetes Mother   . Liver disease Brother   . Breast cancer Neg Hx     SOCIAL HX: see hpi   Current Outpatient Medications:  .  ascorbic acid (VITAMIN C) 500 MG tablet, Take 500 mg by mouth daily., Disp: , Rfl:  .  aspirin 81 MG tablet, Take 81 mg by mouth daily., Disp: , Rfl:  .  atenolol (TENORMIN) 25 MG tablet, TAKE 1/2 TABLETS (12.5 MG TOTAL) BY MOUTH DAILY., Disp: 45 tablet, Rfl: 5 .  benzonatate (TESSALON) 100 MG capsule, Take 1 capsule (100 mg total) by  mouth 2 (two) times daily as needed for cough., Disp: 20 capsule, Rfl: 0 .  hydrochlorothiazide (HYDRODIURIL) 25 MG tablet, Take 0.5 tablets (12.5 mg total) by mouth daily. Please keep upcoming appt with Dr. [name] in November for future refills. Thank you, Disp: 45 tablet, Rfl: 1 .  levothyroxine (SYNTHROID, LEVOTHROID) 50 MCG tablet, Take 50 mcg by mouth daily before breakfast., Disp: , Rfl:  .  losartan (COZAAR) 100 MG tablet, Take 1 tablet (100 mg total) by mouth daily. Please make annual appt for future refills. [phone]. 1st attempt., Disp: 90 tablet, Rfl: 0 .  Multiple Vitamin (MULTIVITAMIN) capsule, Take  1 capsule by mouth daily., Disp: , Rfl:  .  pravastatin (PRAVACHOL) 80 MG tablet, TAKE 1 TABLET (80 MG TOTAL) BY MOUTH DAILY., Disp: 90 tablet, Rfl: 1 .  valACYclovir (VALTREX) 500 MG tablet, TAKE 1 TABLET (500 MG TOTAL) BY MOUTH DAILY., Disp: 90 tablet, Rfl: 1  EXAM:  VITALS per patient if applicable: see hpi  GENERAL: alert, oriented, appears well and in no acute distress  HEENT: atraumatic, conjunttiva clear, no obvious abnormalities on inspection of external nose and ears  NECK: normal movements of the head and neck  LUNGS: on inspection no signs of respiratory distress, breathing rate appears normal, no obvious gross SOB, gasping or wheezing  CV: no obvious cyanosis  MS: moves all visible extremities without noticeable abnormality  PSYCH/NEURO: pleasant and cooperative, no obvious depression or anxiety, speech and thought processing grossly intact  ASSESSMENT AND PLAN:  Discussed the following assessment and plan:  Hypertension, unspecified type - Plan: hydrochlorothiazide (HYDRODIURIL) 25 MG tablet  Carotid artery disease, unspecified laterality (HCC) - Plan: hydrochlorothiazide (HYDRODIURIL) 25 MG tablet  Other specified hypothyroidism  Aortic atherosclerosis ([place])  Discussed treatment options for stress and depression. She feels is quite mild at this point and declined treatment. She agrees to call if any worsening or new concerns  Refilled blood pressure medication. Reviewed medication list.  Lifestyle recs advised  Discussed social distancing and ways to exercise under Stay at home orders during [place].    I discussed the assessment and treatment plan with the patient. The patient was provided an opportunity to ask questions and all were answered. The patient agreed with the plan and demonstrated an understanding of the instructions.   The patient was advised to call back or seek an in-person evaluation if the symptoms worsen or if the condition fails  to improve as anticipated.   Follow up instructions: Advised assistant Wendie Simmer to help patient arrange the following: -arrange TOC visit with Dr. Ethlyn Gallery in 2-3 months   [name], DO

## 2019-02-04 ENCOUNTER — Other Ambulatory Visit: Payer: Self-pay

## 2019-02-04 ENCOUNTER — Telehealth: Payer: Self-pay | Admitting: *Deleted

## 2019-02-04 ENCOUNTER — Ambulatory Visit (INDEPENDENT_AMBULATORY_CARE_PROVIDER_SITE_OTHER): Payer: Medicare Other | Admitting: Family Medicine

## 2019-02-04 DIAGNOSIS — F320 Major depressive disorder, single episode, mild: Secondary | ICD-10-CM | POA: Diagnosis not present

## 2019-02-04 DIAGNOSIS — R413 Other amnesia: Secondary | ICD-10-CM | POA: Diagnosis not present

## 2019-02-04 NOTE — Telephone Encounter (Signed)
Per office notes from 4/21, I called the pts sister Gardiner Ramus and gave her the number to contact [facility] at [phone] for an appt.

## 2019-02-04 NOTE — Telephone Encounter (Signed)
Copied from [place] [id number]. Topic: Appointment Scheduling - Scheduling Inquiry for Clinic >> Feb 03, 2019  4:10 PM Valla Leaver wrote: Reason for CRM: Needs virtual link to be sent to [phone] instead, per Ayesha Rumpf, patients sister.

## 2019-02-04 NOTE — Progress Notes (Signed)
Virtual Visit via Video Note  I connected with   on 02/04/19 at 10:30 AM EDT by a video enabled telemedicine application and verified that I am speaking with the correct person using two identifiers.  Location patient: home Location provider:work or home office Persons participating in the virtual visit: patient, provider  I discussed the limitations of evaluation and management by telemedicine and the availability of in person appointments. The patient expressed understanding and agreed to proceed.   HPI:  Depression: -husband passed away 9 years ago -sister reports lonely life -sister reports Adrianah repeats things, loses things, and finances are not in order -sister reports patient becomes angry when they suggest there is an issue with memory -they both report no issues with driving or forgetting how to get places -pt reports feeling mild depression for many years -reports she didn't know there was really a issue -has church and strong faith -pt and sister deny safety concerns, leaving stove on, thoughts of harm, poor appetite, poor sleep, overeating, hallucinations  ROS: See pertinent positives and negatives per HPI.  Past Medical History:  Diagnosis Date  . Aortic atherosclerosis ([place])    a. noted on CT 01/2018.  . Arthritis   . Colonic polyp   . Coronary atherosclerosis    a. noted on CT 01/2018.  Marland Kitchen Genital herpes   . Hyperlipidemia   . Hypertension   . Mild carotid artery disease ([place])   . Osteoporosis   . Pulmonary nodules    a. folloewd by PCP by serial Ct.  . Thyroid disease    seeing Dr. Buddy Duty  . Tremor     Past Surgical History:  Procedure Laterality Date  . ABDOMINAL HYSTERECTOMY  1988   no cancer - reports total with bilat oophorectomy  . APPENDECTOMY  1988  . BREAST CYST EXCISION Bilateral   . BREAST SURGERY  1985   biopsy  . FOOT SURGERY    . TOTAL HIP ARTHROPLASTY      Family History  Problem Relation Age of Onset  . Cancer Father         pancreatic  . Pancreatic cancer Father   . Heart disease Father   . Heart attack Sister 25  . Arthritis Mother   . Hyperlipidemia Mother   . Hypertension Mother   . Heart disease Mother   . Diabetes Mother   . Liver disease Brother   . Breast cancer Neg Hx     SOCIAL HX: see hpi   Current Outpatient Medications:  .  ascorbic acid (VITAMIN C) 500 MG tablet, Take 500 mg by mouth daily., Disp: , Rfl:  .  aspirin 81 MG tablet, Take 81 mg by mouth daily., Disp: , Rfl:  .  atenolol (TENORMIN) 25 MG tablet, TAKE 1/2 TABLETS (12.5 MG TOTAL) BY MOUTH DAILY., Disp: 45 tablet, Rfl: 5 .  benzonatate (TESSALON) 100 MG capsule, Take 1 capsule (100 mg total) by mouth 2 (two) times daily as needed for cough., Disp: 20 capsule, Rfl: 0 .  hydrochlorothiazide (HYDRODIURIL) 25 MG tablet, Take 0.5 tablets (12.5 mg total) by mouth daily. Please keep upcoming appt with Dr. [name] in November for future refills. Thank you, Disp: 45 tablet, Rfl: 1 .  levothyroxine (SYNTHROID, LEVOTHROID) 50 MCG tablet, Take 50 mcg by mouth daily before breakfast., Disp: , Rfl:  .  losartan (COZAAR) 100 MG tablet, Take 1 tablet (100 mg total) by mouth daily. Please make annual appt for future refills. [phone]. 1st attempt., Disp: 90 tablet, Rfl:  0 .  Multiple Vitamin (MULTIVITAMIN) capsule, Take 1 capsule by mouth daily., Disp: , Rfl:  .  pravastatin (PRAVACHOL) 80 MG tablet, TAKE 1 TABLET (80 MG TOTAL) BY MOUTH DAILY., Disp: 90 tablet, Rfl: 1 .  valACYclovir (VALTREX) 500 MG tablet, TAKE 1 TABLET (500 MG TOTAL) BY MOUTH DAILY., Disp: 90 tablet, Rfl: 1  EXAM:  VITALS per patient if applicable:  GENERAL: alert, oriented, appears well and in no acute distress  HEENT: atraumatic, conjunttiva clear, no obvious abnormalities on inspection of external nose and ears  NECK: normal movements of the head and neck  LUNGS: on inspection no signs of respiratory distress, breathing rate appears normal, no obvious gross SOB,  gasping or wheezing  CV: no obvious cyanosis  MS: moves all visible extremities without noticeable abnormality  PSYCH/NEURO: pleasant and cooperative,  speech and thought processing grossly intact  ASSESSMENT AND PLAN: More than 50% of over 25 minutes spent in total in caring for this patient was spent face-to-face with the patient, counseling and/or coordinating care.   Discussed the following assessment and plan:   Depression, major, single episode, mild ([place])  Memory loss - Plan: Ambulatory referral to Neurology   Discussed at length various causes of memory loss, evaluation and management.  Patient admits to mild depression symptoms for many years, denies any severe symptoms. She feels is able to cope and is not concerned. Sister feels there are significant memory concerns, perhaps related to the depression and in particular feels pt misplaces thinks and repeats herself more then normal. They want her to be evaluated for memory loss with a specialist. Referral to neurology placed. Advised they call in 1 week if they do not have details of this visit. They agreed to do so. Also advised CBT and will have assistant contact them with # to schedule. Discussed medications for depression but for now opted for CBT. Follow up as planned with Dr. Ethlyn Gallery as scheduled.   I discussed the assessment and treatment plan with the patient. The patient was provided an opportunity to ask questions and all were answered. The patient agreed with the plan and demonstrated an understanding of the instructions.   The patient was advised to call back or seek an in-person evaluation if the symptoms worsen or if the condition fails to improve as anticipated.   Follow up instructions: Advised assistant Wendie Simmer to help patient arrange the following: -# for [facility] for CBT   [name], DO

## 2019-02-04 NOTE — Telephone Encounter (Signed)
Doxy[employer]me message sent to the number below.

## 2019-02-07 ENCOUNTER — Other Ambulatory Visit: Payer: Self-pay

## 2019-02-13 DIAGNOSIS — R51 Headache: Secondary | ICD-10-CM

## 2019-02-13 DIAGNOSIS — E559 Vitamin D deficiency, unspecified: Secondary | ICD-10-CM | POA: Insufficient documentation

## 2019-02-13 DIAGNOSIS — R519 Headache, unspecified: Secondary | ICD-10-CM | POA: Insufficient documentation

## 2019-02-13 DIAGNOSIS — M25559 Pain in unspecified hip: Secondary | ICD-10-CM | POA: Insufficient documentation

## 2019-02-13 DIAGNOSIS — M8560 Other cyst of bone, unspecified site: Secondary | ICD-10-CM | POA: Insufficient documentation

## 2019-02-13 DIAGNOSIS — IMO0002 Reserved for concepts with insufficient information to code with codable children: Secondary | ICD-10-CM | POA: Insufficient documentation

## 2019-02-13 DIAGNOSIS — M169 Osteoarthritis of hip, unspecified: Secondary | ICD-10-CM | POA: Insufficient documentation

## 2019-02-14 ENCOUNTER — Other Ambulatory Visit: Payer: Self-pay

## 2019-02-14 ENCOUNTER — Telehealth (INDEPENDENT_AMBULATORY_CARE_PROVIDER_SITE_OTHER): Payer: Medicare Other | Admitting: Neurology

## 2019-02-14 ENCOUNTER — Encounter: Payer: Self-pay | Admitting: Neurology

## 2019-02-14 ENCOUNTER — Telehealth: Payer: Self-pay | Admitting: Neurology

## 2019-02-14 VITALS — Ht 62.0 in | Wt 157.0 lb

## 2019-02-14 DIAGNOSIS — G3184 Mild cognitive impairment, so stated: Secondary | ICD-10-CM

## 2019-02-14 MED ORDER — DONEPEZIL HCL 10 MG PO TABS: ORAL_TABLET | ORAL | 11 refills | Status: DC

## 2019-02-14 NOTE — Progress Notes (Signed)
Virtual Visit via Video Note The purpose of this virtual visit is to provide medical care while limiting exposure to the novel coronavirus.    Consent was obtained for video visit:  Yes.   Answered questions that patient had about telehealth interaction:  Yes.   I discussed the limitations, risks, security and privacy concerns of performing an evaluation and management service by telemedicine. I also discussed with the patient that there may be a patient responsible charge related to this service. The patient expressed understanding and agreed to proceed.  Pt location: Home Physician Location: office Name of referring provider:  [name], DO I connected with Shela Leff Kerstetter at patients initiation/request on 02/14/2019 at  1:00 PM EDT by video enabled telemedicine application and verified that I am speaking with the correct person using two identifiers. Pt MRN:  [id number] Pt DOB:  07-27-1936 Video Participants:  Shela Leff [place];  Gardiner Ramus (sister)  History of Present Illness:  This is an 83 year old right-handed woman with a history of hypertension, hyperlipidemia, hypothyroidism, presenting for evaluation of worsening memory. Memory concerns were raised by family, patient has no insight into her condition and was upset throughout most of the visit (but participated). She states "I forget things, but if you are asking if I have dementia, I don't." She thinks her memory is good. Family started noticing memory changes for several years, worsening recently where she is very forgetful, repeating things constantly. They got 10 Christmas cards from her because she did not remember sending them. Joaquim Lai reports "she has trouble admitting she has issues." She has been living alone for the past 9 years since her husband passed away. She manages finances and medications and denies any difficulties doing these, although Joaquim Lai reports that they had to help her last March because she could  not get her taxes together, she seemed to have trouble keeping up with things. She continues to drive and denies getting lost. Joaquim Lai reports she misplaces things frequently (she denies this). Joaquim Lai has noticed more irritability since they approached her about memory concerns last March, no paranoia or hallucinations. She is independent with dressing and bathing, no hygiene concerns, house is well-kept. No family history of dementia, no history of significant head injuries or alcohol use.  She denies any headaches, dizziness, diplopia, dysarthria/dysphagia, neck/back pain, focal numbness/tingling/weakness, bowel/bladder dysfunction, anosmia, or tremors. Sleep is good. She denies any falls.   Laboratory Data: Lab Results  Component Value Date   TSH 1.45 09/17/2018    PAST MEDICAL HISTORY: Past Medical History:  Diagnosis Date  . Aortic atherosclerosis ([place])    a. noted on CT 01/2018.  . Arthritis   . Colonic polyp   . Coronary atherosclerosis    a. noted on CT 01/2018.  Marland Kitchen Genital herpes   . Hyperlipidemia   . Hypertension   . Mild carotid artery disease ([place])   . Osteoporosis   . Pulmonary nodules    a. folloewd by PCP by serial Ct.  . Thyroid disease    seeing Dr. Buddy Duty  . Tremor     PAST SURGICAL HISTORY: Past Surgical History:  Procedure Laterality Date  . ABDOMINAL HYSTERECTOMY  1988   no cancer - reports total with bilat oophorectomy  . APPENDECTOMY  1988  . BREAST CYST EXCISION Bilateral   . BREAST SURGERY  1985   biopsy  . FOOT SURGERY    . TOTAL HIP ARTHROPLASTY      MEDICATIONS: Current Outpatient Medications on  File Prior to Visit  Medication Sig Dispense Refill  . alendronate (FOSAMAX) 10 MG tablet Take by mouth.    Marland Kitchen ascorbic acid (VITAMIN C) 500 MG tablet Take 500 mg by mouth daily.    Marland Kitchen aspirin 81 MG tablet Take 81 mg by mouth daily.    Marland Kitchen atenolol (TENORMIN) 25 MG tablet TAKE 1/2 TABLETS (12.5 MG TOTAL) BY MOUTH DAILY. (Patient taking differently: 12.5  mg. ) 45 tablet 5  . hydrochlorothiazide (HYDRODIURIL) 25 MG tablet Take 0.5 tablets (12.5 mg total) by mouth daily. Please keep upcoming appt with Dr. [name] in November for future refills. Thank you 45 tablet 1  . levothyroxine (SYNTHROID, LEVOTHROID) 50 MCG tablet Take 50 mcg by mouth daily before breakfast.    . losartan (COZAAR) 100 MG tablet Take 1 tablet (100 mg total) by mouth daily. Please make annual appt for future refills. [phone]. 1st attempt. 90 tablet 0  . Multiple Vitamin (MULTIVITAMIN) capsule Take 1 capsule by mouth daily.    . pravastatin (PRAVACHOL) 80 MG tablet TAKE 1 TABLET (80 MG TOTAL) BY MOUTH DAILY. 90 tablet 1  . valACYclovir (VALTREX) 500 MG tablet TAKE 1 TABLET (500 MG TOTAL) BY MOUTH DAILY. 90 tablet 1  . vitamin B-12 (CYANOCOBALAMIN) 100 MCG tablet Take by mouth.    . benzonatate (TESSALON) 100 MG capsule Take 1 capsule (100 mg total) by mouth 2 (two) times daily as needed for cough. (Patient not taking: Reported on 02/14/2019) 20 capsule 0   No current facility-administered medications on file prior to visit.     ALLERGIES: Allergies  Allergen Reactions  . Penicillins Swelling and Rash    ALL CILLINS  . Tetracycline Itching  . Atorvastatin Other (See Comments)    Leg cramps Leg cramps  . Bactrim [Sulfamethoxazole-Trimethoprim] Itching  . Celecoxib Other (See Comments)    Memory disturbance, slowed thinking Memory disturbance, slowed thinking  . Niacin Other (See Comments)    Caused elevated BP  . Niaspan [Niacin Er]     Elevated BP  . Rosuvastatin Other (See Comments)    Elevated CPK  . Simvastatin Other (See Comments)    Leg cramps Leg cramps  . Tetracyclines & Related Itching    FAMILY HISTORY: Family History  Problem Relation Age of Onset  . Cancer Father        pancreatic  . Pancreatic cancer Father   . Heart disease Father   . Heart attack Sister 58  . Arthritis Mother   . Hyperlipidemia Mother   . Hypertension Mother   . Heart  disease Mother   . Diabetes Mother   . Liver disease Brother   . Breast cancer Neg Hx     Observations/Objective:   Vitals:   02/14/19 0925  Weight: 157 lb (71.2 kg)  Height: 5\' 2"  (1.575 m)   Patient is awake, alert, oriented x 3 (missed date and day of week). No aphasia or dysarthria. Decreased fluency, intact comprehension. Remote and recent memory intact. Able to repeat. Cranial nerves: Extraocular movements intact with no nystagmus. No facial asymmetry. Motor: moves all extremities symmetrically, at least anti-gravity x 4. No incoordination on finger to nose testing. Gait: narrow-based and steady, able to tandem walk adequately. Negative Romberg test.  Montreal Cognitive Assessment  02/14/2019  Visuospatial/ Executive (0/5) 0  Naming (0/3) 0  Attention: Read list of digits (0/2) 2  Attention: Read list of letters (0/1) 0  Attention: Serial 7 subtraction starting at 100 (0/3) 2  Language: Repeat phrase (  0/2) 2  Language : Fluency (0/1) 0  Abstraction (0/2) 1  Delayed Recall (0/5) 5  Orientation (0/6) 4  Total 16  Adjusted Score (based on education) 17    Assessment and Plan:   This is an 83 year old right-handed woman with hypertension, hyperlipidemia, hypothyroidism, presenting for evaluation of worsening memory. Her limited neurological exam on video appears non-focal, MOCA (blind) score (done over phone) was 17/30. Symptoms suggestive of mild cognitive impairment, possibly early dementia. It appears there may be beginning signs of difficulties with complex tasks but overall she is managing fine alone. We discussed different causes of memory loss, check B12 and RPR. MRI brain without contrast will be ordered to assess for underlying structural abnormality. We discussed starting Donepezil, including side effects and expectations, start 5mg  daily for 2 weeks, then increase to 10mg  daily. We discussed mood changes that can occur with memory changes, as well as mood issues causing memory  changes. She is scheduled to see a therapist this month. She is upset about this visit, with minimal insight into her condition, but agrees with plan and medication. Continue family supervision and monitoring driving. Follow-up in 6 months, they know to call for any changes.    Follow Up Instructions:   -I discussed the assessment and treatment plan with the patient. The patient was provided an opportunity to ask questions and all were answered. The patient agreed with the plan and demonstrated an understanding of the instructions.   The patient was advised to call back or seek an in-person evaluation if the symptoms worsen or if the condition fails to improve as anticipated.    Cameron Sprang, MD

## 2019-02-17 ENCOUNTER — Other Ambulatory Visit: Payer: Self-pay

## 2019-02-17 DIAGNOSIS — G3184 Mild cognitive impairment, so stated: Secondary | ICD-10-CM

## 2019-02-17 DIAGNOSIS — R413 Other amnesia: Secondary | ICD-10-CM

## 2019-02-25 ENCOUNTER — Other Ambulatory Visit: Payer: Medicare Other

## 2019-03-03 ENCOUNTER — Ambulatory Visit: Payer: Medicare Other | Admitting: Licensed Clinical Social Worker

## 2019-03-04 ENCOUNTER — Telehealth: Payer: Self-pay | Admitting: *Deleted

## 2019-03-04 ENCOUNTER — Ambulatory Visit (INDEPENDENT_AMBULATORY_CARE_PROVIDER_SITE_OTHER): Payer: Medicare Other | Admitting: Licensed Clinical Social Worker

## 2019-03-04 DIAGNOSIS — F4321 Adjustment disorder with depressed mood: Secondary | ICD-10-CM

## 2019-03-04 NOTE — Telephone Encounter (Signed)
Copied from [place] [id number]. Topic: General - Other >> Mar 04, 2019  1:11 PM Celene Kras A wrote: Reason for CRM: Pts sister called in stating she is concerned because she does not have any cholesterol medication to be taking currently. Pt would like to have her labs read back to her and to be advised on wether she should be taking medication or not. Please advise

## 2019-03-05 ENCOUNTER — Telehealth: Payer: Self-pay | Admitting: Neurology

## 2019-03-05 NOTE — Telephone Encounter (Signed)
Caller left message with answering service on 03-04-19 @ 4:36   The caller states she needs to know who ordered blood work from the patient  No symptoms reported and the option to speak wit after hour nurse was declined

## 2019-03-05 NOTE — Telephone Encounter (Signed)
I called the pts sister Mrs Loma Sousa and informed her the pt should be taking Pravastatin daily.  She stated she is now helping care for the pt due to memory issues, looked through the medication bottles yesterday and requested a refill from the pharmacy and she is due to get the Rx today.  I also advised Mrs Loma Sousa of the last cholesterol results from 2018.

## 2019-03-05 NOTE — Telephone Encounter (Signed)
Spoke with pt informed her that Dr. Delice Lesch ordered lab work when she had her e-visit

## 2019-03-06 ENCOUNTER — Ambulatory Visit
Admission: RE | Admit: 2019-03-06 | Discharge: 2019-03-06 | Disposition: A | Discharge status: Home / Self Care | Payer: Medicare Other | Source: Ambulatory Visit | Attending: Neurology | Admitting: Neurology

## 2019-03-06 ENCOUNTER — Other Ambulatory Visit: Payer: Self-pay

## 2019-03-06 DIAGNOSIS — G3184 Mild cognitive impairment, so stated: Secondary | ICD-10-CM

## 2019-03-06 DIAGNOSIS — R413 Other amnesia: Secondary | ICD-10-CM

## 2019-03-07 ENCOUNTER — Telehealth: Payer: Self-pay

## 2019-03-07 NOTE — Telephone Encounter (Signed)
Called and spoke with Pt sister Manus Gunning Per DPR we can talk to her MRI brain did not show any evidence of tumor, stroke, or bleed. It showed age-related changes and hardening of the small blood vessels in the brain seen in people with cholesterol and blood pressure issues, very important to control BP and cholesterol. Continue memory medication Donepezil, verbalized understanding, stated she would inform pt of results,

## 2019-03-14 ENCOUNTER — Telehealth: Payer: Self-pay

## 2019-03-14 ENCOUNTER — Other Ambulatory Visit: Payer: Self-pay | Admitting: Family Medicine

## 2019-03-14 MED ORDER — LOSARTAN POTASSIUM-HCTZ 100-12.5 MG PO TABS: 1.0000 | ORAL_TABLET | Freq: Every day | ORAL | 1 refills | Status: DC

## 2019-03-14 NOTE — Telephone Encounter (Signed)
Copied from [place] [id number]. Topic: Quick Communication - Rx Refill/Question >> Mar 14, 2019 11:08 AM Carolyn Stare wrote: Medication: hydrochlorothiazide (HYDRODIURIL) 25 MG tablet   losartan (COZAAR) 100 MG tablet    Pharmacy call to say they have the combination of the above medication and req an RX to fill for pt, 100 losartan is on back order they this refill today   Preferred Pharmacy [facility]    Please be advised that RX refills may take up to 3 business days. We ask that you follow-up with your pharmacy.

## 2019-03-14 NOTE — Telephone Encounter (Signed)
Message sent back to Dr Ethlyn Gallery to please verify sigs as instructions for HCTZ state to take 0.5 tablet.

## 2019-03-14 NOTE — Telephone Encounter (Signed)
I have sent  100mg -12.5mg .

## 2019-03-14 NOTE — Telephone Encounter (Signed)
Ok to refill losartan-hctz combination (I believe message is saying they have combo but not losartan alone?)

## 2019-03-24 ENCOUNTER — Other Ambulatory Visit: Payer: Self-pay | Admitting: Cardiology

## 2019-03-26 NOTE — Telephone Encounter (Signed)
Order Providers   Prescribing Provider Encounter Provider  Caren Macadam, MD Caren Macadam, MD  Outpatient Medication Detail    Disp Refills Start End   losartan-hydrochlorothiazide [facility]) 100-12.5 MG tablet 90 tablet 1 03/14/2019    Sig - Route: Take 1 tablet by mouth daily. - Oral   Sent to pharmacy as: losartan-hydrochlorothiazide (HYZAAR) 100-12.5 MG tablet   E-Prescribing Status: Receipt confirmed by pharmacy (03/14/2019 5:21 PM EDT)   Pharmacy   [id number] - [place], [place]

## 2019-04-01 ENCOUNTER — Emergency Department (HOSPITAL_COMMUNITY): Payer: Medicare Other

## 2019-04-01 ENCOUNTER — Encounter (HOSPITAL_COMMUNITY): Payer: Self-pay | Admitting: Emergency Medicine

## 2019-04-01 ENCOUNTER — Ambulatory Visit: Payer: Self-pay

## 2019-04-01 ENCOUNTER — Other Ambulatory Visit: Payer: Self-pay

## 2019-04-01 ENCOUNTER — Ambulatory Visit: Payer: Medicare Other | Admitting: Licensed Clinical Social Worker

## 2019-04-01 ENCOUNTER — Observation Stay (HOSPITAL_COMMUNITY)
Admission: EM | Admit: 2019-04-01 | Discharge: 2019-04-02 | Disposition: A | Discharge status: Home / Self Care | Payer: Medicare Other | Attending: Internal Medicine | Admitting: Internal Medicine

## 2019-04-01 DIAGNOSIS — E039 Hypothyroidism, unspecified: Secondary | ICD-10-CM | POA: Diagnosis not present

## 2019-04-01 DIAGNOSIS — Z87891 Personal history of nicotine dependence: Secondary | ICD-10-CM | POA: Diagnosis not present

## 2019-04-01 DIAGNOSIS — R001 Bradycardia, unspecified: Secondary | ICD-10-CM | POA: Diagnosis not present

## 2019-04-01 DIAGNOSIS — R42 Dizziness and giddiness: Secondary | ICD-10-CM | POA: Diagnosis not present

## 2019-04-01 DIAGNOSIS — I10 Essential (primary) hypertension: Secondary | ICD-10-CM | POA: Diagnosis present

## 2019-04-01 DIAGNOSIS — G9340 Encephalopathy, unspecified: Secondary | ICD-10-CM | POA: Diagnosis present

## 2019-04-01 DIAGNOSIS — Z20828 Contact with and (suspected) exposure to other viral communicable diseases: Secondary | ICD-10-CM | POA: Diagnosis not present

## 2019-04-01 DIAGNOSIS — T462X1A Poisoning by other antidysrhythmic drugs, accidental (unintentional), initial encounter: Secondary | ICD-10-CM

## 2019-04-01 DIAGNOSIS — Z79899 Other long term (current) drug therapy: Secondary | ICD-10-CM | POA: Insufficient documentation

## 2019-04-01 DIAGNOSIS — R55 Syncope and collapse: Secondary | ICD-10-CM | POA: Diagnosis not present

## 2019-04-01 DIAGNOSIS — Z03818 Encounter for observation for suspected exposure to other biological agents ruled out: Secondary | ICD-10-CM | POA: Insufficient documentation

## 2019-04-01 DIAGNOSIS — T460X1A Poisoning by cardiac-stimulant glycosides and drugs of similar action, accidental (unintentional), initial encounter: Secondary | ICD-10-CM | POA: Diagnosis not present

## 2019-04-01 DIAGNOSIS — R531 Weakness: Secondary | ICD-10-CM | POA: Diagnosis not present

## 2019-04-01 LAB — SARS CORONAVIRUS 2: SARS Coronavirus 2: NOT DETECTED

## 2019-04-01 LAB — COMPREHENSIVE METABOLIC PANEL
ALT: 21 U/L (ref 0–44)
AST: 23 U/L (ref 15–41)
Albumin: 4 g/dL (ref 3.5–5.0)
Alkaline Phosphatase: 74 U/L (ref 38–126)
Anion gap: 10 (ref 5–15)
BUN: 20 mg/dL (ref 8–23)
CO2: 28 mmol/L (ref 22–32)
Calcium: 10 mg/dL (ref 8.9–10.3)
Chloride: 102 mmol/L (ref 98–111)
Creatinine, Ser: 0.95 mg/dL (ref 0.44–1.00)
GFR calc Af Amer: >60 mL/min (ref >60)
GFR calc non Af Amer: 56 mL/min — ABNORMAL LOW (ref >60)
Glucose, Bld: 139 mg/dL — ABNORMAL HIGH (ref 70–99)
Potassium: 3 mmol/L — ABNORMAL LOW (ref 3.5–5.1)
Sodium: 140 mmol/L (ref 135–145)
Total Bilirubin: 0.5 mg/dL (ref 0.3–1.2)
Total Protein: 7.5 g/dL (ref 6.5–8.1)

## 2019-04-01 LAB — CBC
HCT: 42.7 % (ref 36.0–46.0)
Hemoglobin: 14 g/dL (ref 12.0–15.0)
MCH: 29.5 pg (ref 26.0–34.0)
MCHC: 32.8 g/dL (ref 30.0–36.0)
MCV: 90.1 fL (ref 80.0–100.0)
Platelets: 324 10*3/uL (ref 150–400)
RBC: 4.74 MIL/uL (ref 3.87–5.11)
RDW: 13.7 % (ref 11.5–15.5)
WBC: 8.2 10*3/uL (ref 4.0–10.5)
nRBC: 0 % (ref 0.0–0.2)

## 2019-04-01 LAB — MAGNESIUM: Magnesium: 1.9 mg/dL (ref 1.7–2.4)

## 2019-04-01 LAB — CBG MONITORING, ED: Glucose-Capillary: 120 mg/dL — ABNORMAL HIGH (ref 70–99)

## 2019-04-01 LAB — TSH: TSH: 6.498 u[IU]/mL — ABNORMAL HIGH (ref 0.350–4.500)

## 2019-04-01 MED ORDER — ATROPINE SULFATE 1 MG/10ML IJ SOSY
0.5000 mg | PREFILLED_SYRINGE | Freq: Once | INTRAMUSCULAR | Status: AC
  Administered 2019-04-01: 0.5 mg via INTRAVENOUS
  Filled 2019-04-01: qty 10

## 2019-04-01 MED ORDER — SODIUM CHLORIDE 0.9% FLUSH: 3.0000 mL | Freq: Once | INTRAVENOUS | Status: DC

## 2019-04-01 MED ORDER — POTASSIUM CHLORIDE 10 MEQ/100ML IV SOLN
10.0000 meq | Freq: Once | INTRAVENOUS | Status: AC
  Administered 2019-04-01: 10 meq via INTRAVENOUS
  Filled 2019-04-01: qty 100

## 2019-04-01 NOTE — ED Triage Notes (Signed)
Patient reports she may have doubled up on her BP medication -  She states she woke up not feeling well, but has since had increased dizziness with moving around and ambulating, "brain fog," and has felt "out of it." She denies pain, N/V, fevers/chills. A&O x 4, but continues to say that she may have taken too much medication, but isn't sure.

## 2019-04-01 NOTE — ED Notes (Signed)
Gold and blue top drawn on pt

## 2019-04-01 NOTE — ED Provider Notes (Addendum)
[facility] EMERGENCY DEPARTMENT Provider Note   CSN: [id number] Arrival date & time: 04/01/19  1817    History   Chief Complaint Chief Complaint  Patient presents with   Bradycardia   Altered Mental Status    HPI Felicia Kelly is a 83 y.o. female.     HPI Patient presents with feeling bad.  States that she thinks she may have taken a double dose of her medications today.  States that she was feeling okay but then began to feel worse.  States she just feels slowed all over.  No chest pain.  No cough.  No trouble breathing.  No fevers.  No headache.  No confusion.  Upon arrival heart rate is in the 40s.  Blood pressure however maintained.  No swelling in her legs.  No dysuria.   Past Medical History:  Diagnosis Date   Aortic atherosclerosis ([place])    a. noted on CT 01/2018.   Arthritis    Colonic polyp    Coronary atherosclerosis    a. noted on CT 01/2018.   Genital herpes    Hyperlipidemia    Hypertension    Mild carotid artery disease ([facility])    Osteoporosis    Pulmonary nodules    a. folloewd by PCP by serial Ct.   Thyroid disease    seeing Dr. Buddy Duty   Tremor     Patient Active Problem List   Diagnosis Date Noted   Bradycardia 04/01/2019   Cystocele 02/13/2019   Facial pain 02/13/2019   Hip pain 02/13/2019   Osteoarthritis of hip 02/13/2019   Subchondral cysts 02/13/2019   Vitamin D deficiency 02/13/2019   Aortic atherosclerosis ([place]) 10/14/2017   Solitary pulmonary nodule 10/23/2016   Hyperglycemia 07/11/2016   Hypercholesterolemia 06/04/2013   Hypothyroidism 06/04/2013   Genital herpes 06/04/2013   Osteopenia 06/27/2012   Malignant basal cell neoplasm of skin 02/14/2012   Benign essential hypertension 11/22/2011   Internal carotid artery stenosis 09/18/2011   Thumb pain 09/13/2011   Multinodular goiter 10/16/2009   Herpes simplex type 2 infection 10/05/2008   Single renal cyst 02/14/2008     Past Surgical History:  Procedure Laterality Date   ABDOMINAL HYSTERECTOMY  1988   no cancer - reports total with bilat oophorectomy   APPENDECTOMY  1988   BREAST CYST EXCISION Bilateral    BREAST SURGERY  1985   biopsy   FOOT SURGERY     TOTAL HIP ARTHROPLASTY       OB History   No obstetric history on file.      Home Medications    Prior to Admission medications   Medication Sig Start Date End Date Taking? Authorizing Provider  atenolol (TENORMIN) 25 MG tablet TAKE 1/2 TABLETS (12.5 MG TOTAL) BY MOUTH DAILY. Patient taking differently: Take 12.5 mg by mouth daily.  06/11/18  Yes Colin Benton R, DO  donepezil (ARICEPT) 10 MG tablet Take 1/2 tablet daily for 2 weeks, then increase to 1 tablet daily Patient taking differently: Take 10 mg by mouth daily.  02/14/19  Yes Cameron Sprang, MD  levothyroxine (SYNTHROID, LEVOTHROID) 50 MCG tablet Take 50 mcg by mouth daily before breakfast.   Yes [provider]  losartan-hydrochlorothiazide (HYZAAR) 100-12.5 MG tablet Take 1 tablet by mouth daily. 03/14/19  Yes Koberlein, Junell C, MD  pravastatin (PRAVACHOL) 80 MG tablet TAKE 1 TABLET (80 MG TOTAL) BY MOUTH DAILY. 12/10/18  Yes [name], DO  valACYclovir (VALTREX) 500 MG tablet TAKE  1 TABLET (500 MG TOTAL) BY MOUTH DAILY. Patient taking differently: Take 500 mg by mouth daily.  11/19/18  Yes Colin Benton R, DO  benzonatate (TESSALON) 100 MG capsule Take 1 capsule (100 mg total) by mouth 2 (two) times daily as needed for cough. Patient not taking: Reported on 02/14/2019 09/01/16   [name], DO    Family History Family History  Problem Relation Age of Onset   Cancer Father        pancreatic   Pancreatic cancer Father    Heart disease Father    Heart attack Sister 80   Arthritis Mother    Hyperlipidemia Mother    Hypertension Mother    Heart disease Mother    Diabetes Mother    Liver disease Brother    Breast cancer Neg Hx     Social  History Social History   Tobacco Use   Smoking status: Former Smoker    Quit date: 10/16/1978    Years since quitting: 40.4   Smokeless tobacco: Never Used   Tobacco comment: 1963 when her father died;  smoked for a brief period of time  Substance Use Topics   Alcohol use: No    Alcohol/week: 0.0 standard drinks   Drug use: No     Allergies   Penicillins, Tetracycline, Atorvastatin, Bactrim [sulfamethoxazole-trimethoprim], Celecoxib, Niacin, Niaspan [niacin er], Rosuvastatin, Simvastatin, and Tetracyclines & related   Review of Systems Review of Systems  Constitutional: Positive for fatigue. Negative for appetite change and fever.  HENT: Negative for congestion.   Respiratory: Negative for shortness of breath.   Cardiovascular: Negative for chest pain.  Gastrointestinal: Negative for abdominal pain.  Genitourinary: Negative for flank pain.  Musculoskeletal: Negative for back pain.  Skin: Negative for rash.  Neurological: Positive for weakness.  Psychiatric/Behavioral: Negative for confusion.     Physical Exam Updated Vital Signs BP (!) 157/67    Pulse (!) 41    Temp 98.3 F (36.8 C) (Oral)    Resp 12    SpO2 97%   Physical Exam Vitals signs and nursing note reviewed.  Constitutional:      Appearance: Normal appearance.  HENT:     Mouth/Throat:     Mouth: Mucous membranes are moist.  Eyes:     Extraocular Movements: Extraocular movements intact.  Neck:     Musculoskeletal: Neck supple.  Cardiovascular:     Rate and Rhythm: Bradycardia present.  Pulmonary:     Breath sounds: No wheezing, rhonchi or rales.  Skin:    General: Skin is warm.     Capillary Refill: Capillary refill takes less than 2 seconds.  Neurological:     Mental Status: She is alert and oriented to person, place, and time.  Psychiatric:        Mood and Affect: Mood normal.      ED Treatments / Results  Labs (all labs ordered are listed, but only abnormal results are displayed) Labs  Reviewed  COMPREHENSIVE METABOLIC PANEL - Abnormal; Notable for the following components:      Result Value   Potassium 3.0 (*)    Glucose, Bld 139 (*)    GFR calc non Af Amer 56 (*)    All other components within normal limits  TSH - Abnormal; Notable for the following components:   TSH 6.498 (*)    All other components within normal limits  CBG MONITORING, ED - Abnormal; Notable for the following components:   Glucose-Capillary 120 (*)    All  other components within normal limits  SARS CORONAVIRUS 2  CBC  MAGNESIUM  URINALYSIS, ROUTINE W REFLEX MICROSCOPIC    EKG EKG Interpretation  Date/Time:  Tuesday April 01 2019 19:41:49 EDT Ventricular Rate:  45 PR Interval:  148 QRS Duration: 89 QT Interval:  513 QTC Calculation: 444 R Axis:   77 Text Interpretation:  Sinus bradycardia Anteroseptal infarct, age indeterminate Confirmed by Davonna Belling [id number]) on 04/01/2019 8:06:51 PM   Radiology Ct Head Wo Contrast  Result Date: 04/01/2019 CLINICAL DATA:  Altered level of consciousness (LOC), unexplained. Dizziness. EXAM: CT HEAD WITHOUT CONTRAST TECHNIQUE: Contiguous axial images were obtained from the base of the skull through the vertex without intravenous contrast. COMPARISON:  Brain MRI 03/06/2019 FINDINGS: Brain: Generalized atrophy, unchanged from prior. Moderate chronic small vessel ischemia, also unchanged. No intracranial hemorrhage, mass effect, or midline shift. No hydrocephalus. The basilar cisterns are patent. No evidence of territorial infarct or acute ischemia. No extra-axial or intracranial fluid collection. Vascular: Atherosclerosis of skullbase vasculature without hyperdense vessel or abnormal calcification. Skull: No fracture or focal lesion. Sinuses/Orbits: Trace mucosal thickening of left maxillary sinus. Trace opacification of right mastoid air cells. Bilateral cataract resection Other: None. IMPRESSION: 1. No acute intracranial abnormality. 2. Unchanged atrophy and  chronic small vessel ischemia. Electronically Signed   By: Keith Rake M.D.   On: 04/01/2019 21:49   Dg Chest Portable 1 View  Result Date: 04/01/2019 CLINICAL DATA:  Weakness. EXAM: PORTABLE CHEST 1 VIEW COMPARISON:  CT chest dated 02/10/2018. FINDINGS: The heart size is stable. There is no pneumothorax. No large pleural effusion. Degenerative changes are noted of both glenohumeral joints. There is no acute osseous abnormality. The patient's previously noted pulmonary nodules are likely too small to appreciate on this exam. There are aortic calcifications. IMPRESSION: No active disease. Electronically Signed   By: Constance Holster M.D.   On: 04/01/2019 20:23    Procedures Procedures (including critical care time)  Medications Ordered in ED Medications  sodium chloride flush (NS) 0.9 % injection 3 mL (3 mLs Intravenous Not Given 04/01/19 1939)  potassium chloride 10 mEq in 100 mL IVPB (0 mEq Intravenous Stopped 04/01/19 2209)  atropine 1 MG/10ML injection 0.5 mg (0.5 mg Intravenous Given 04/01/19 2222)     Initial Impression / Assessment and Plan / ED Course  I have reviewed the triage vital signs and the nursing notes.  Pertinent labs & imaging results that were available during my care of the patient were reviewed by me and considered in my medical decision making (see chart for details).        Patient with generalized weakness.  Bradycardic.  May have taken too much of her medications.  This would fit with the bradycardia.  Appears to be sinus.  Blood pressure maintained however.  Has a hypokalemia.  I think with a heart rate in the 40s patient benefit from likely admission to the hospital.  Patient appears to have some confusion.  Head CT reassuring.  Urinalysis still pending.  Patient seems to think she took too much medicine and this also could cause some mental status change.  Heart rate continues to be in the 40s but appears to be in the mid to low 40s now.  Was not  responsive to 1/2 mg of atropine.  Patient has maintained her blood pressure.  Will admit to hospitalist.  CRITICAL CARE Performed by: Davonna Belling Total critical care time: 30 minutes Critical care time was exclusive of separately billable procedures and treating  other patients. Critical care was necessary to treat or prevent imminent or life-threatening deterioration. Critical care was time spent personally by me on the following activities: development of treatment plan with patient and/or surrogate as well as nursing, discussions with consultants, evaluation of patient's response to treatment, examination of patient, obtaining history from patient or surrogate, ordering and performing treatments and interventions, ordering and review of laboratory studies, ordering and review of radiographic studies, pulse oximetry and re-evaluation of patient's condition.   Final Clinical Impressions(s) / ED Diagnoses   Final diagnoses:  Bradycardia  Overdose of cardiac medication, accidental or unintentional, initial encounter    ED Discharge Orders    None       Davonna Belling, MD 04/01/19 2256    Davonna Belling, MD 04/01/19 2328

## 2019-04-01 NOTE — Telephone Encounter (Signed)
Pt sister called stating that she came to visit her sister today and she was tired and stating that she felt sick and nauseous.  The patient has no temperature.  Sister is concerned because Felicia Kelly HR is very slow at 72. Her BP 122/50.  The sister went through the BP hx and states that her HR has been as low as 45.  She is unsure when her sister has taken these readings, She is only able to read the history. She states her sister has been taking a new medication for memory.She takes Atenolol for BP. Per protocol pt will go to ER with sister for evaluation of heart rate. Care advice read to sister and she verbalized understanding of all instructions.  Reason for Disposition . Heart beating very slowly (e.g., < 50 / minute)  (Exception: athlete)  Answer Assessment - Initial Assessment Questions 1. DESCRIPTION: "Please describe your heart rate or heart beat that you are having" (e.g., fast/slow, regular/irregular, skipped or extra beats, "palpitations")     slow 2. ONSET: "When did it start?" (Minutes, hours or days)     today 3. DURATION: "How long does it last" (e.g., seconds, minutes, hours)    daily 4. PATTERN "Does it come and go, or has it been constant since it started?"  "Does it get worse with exertion?"   "Are you feeling it now?"     No 5. TAP: "Using your hand, can you tap out what you are feeling on a chair or table in front of you, so that I can hear?" (Note: not all patients can do this)       No 6. HEART RATE: "Can you tell me your heart rate?" "How many beats in 15 seconds?"  (Note: not all patients can do this)       46 BP 122/50 7. RECURRENT SYMPTOM: "Have you ever had this before?" If so, ask: "When was the last time?" and "What happened that time?"      No 8. CAUSE: "What do you think is causing the palpitations?"     Medication 9. CARDIAC HISTORY: "Do you have any history of heart disease?" (e.g., heart attack, angina, bypass surgery, angioplasty, arrhythmia)     none 10.  OTHER SYMPTOMS: "Do you have any other symptoms?" (e.g., dizziness, chest pain, sweating, difficulty breathing)       Feels bad and tired 11. PREGNANCY: "Is there any chance you are pregnant?" "When was your last menstrual period?"      N/A  Protocols used: HEART RATE AND HEARTBEAT QUESTIONS-A-AH

## 2019-04-01 NOTE — ED Notes (Signed)
ED TO INPATIENT HANDOFF REPORT  ED Nurse Name and Phone #: [phone]  S Name/Age/Gender Felicia Kelly 83 y.o. female Room/Bed: 029C/029C  Code Status   Code Status: Not on file  Home/SNF/Other Home Patient oriented to: self, place Is this baseline? Yes   Triage Complete: Triage complete  Chief Complaint low HR 46  Triage Note Patient reports she may have doubled up on her BP medication -  She states she woke up not feeling well, but has since had increased dizziness with moving around and ambulating, "brain fog," and has felt "out of it." She denies pain, N/V, fevers/chills. A&O x 4, but continues to say that she may have taken too much medication, but isn't sure.    Allergies Allergies  Allergen Reactions  . Penicillins Swelling and Rash    ALL CILLINS  . Tetracycline Itching  . Atorvastatin Other (See Comments)    Leg cramps Leg cramps  . Bactrim [Sulfamethoxazole-Trimethoprim] Itching  . Celecoxib Other (See Comments)    Memory disturbance, slowed thinking Memory disturbance, slowed thinking  . Niacin Other (See Comments)    Caused elevated BP  . Niaspan [Niacin Er]     Elevated BP  . Rosuvastatin Other (See Comments)    Elevated CPK  . Simvastatin Other (See Comments)    Leg cramps Leg cramps  . Tetracyclines & Related Itching    Level of Care/Admitting Diagnosis ED Disposition    ED Disposition Condition Comment   Admit  Hospital Area: [place] [[id number]]  Level of Care: Progressive [102]  I expect the patient will be discharged within 24 hours: No (not a candidate for 5C-Observation unit)  Covid Evaluation: Screening Protocol (No Symptoms)  Diagnosis: Bradycardia [696295]  Admitting Physician: Rise Patience [phone]  Attending Physician: Rise Patience Lei.Right  PT Class (Do Not Modify): Observation [104]  PT Acc Code (Do Not Modify): Observation [10022]       B Medical/Surgery History Past Medical History:   Diagnosis Date  . Aortic atherosclerosis ([place])    a. noted on CT 01/2018.  . Arthritis   . Colonic polyp   . Coronary atherosclerosis    a. noted on CT 01/2018.  Marland Kitchen Genital herpes   . Hyperlipidemia   . Hypertension   . Mild carotid artery disease ([employer])   . Osteoporosis   . Pulmonary nodules    a. folloewd by PCP by serial Ct.  . Thyroid disease    seeing Dr. Buddy Duty  . Tremor    Past Surgical History:  Procedure Laterality Date  . ABDOMINAL HYSTERECTOMY  1988   no cancer - reports total with bilat oophorectomy  . APPENDECTOMY  1988  . BREAST CYST EXCISION Bilateral   . BREAST SURGERY  1985   biopsy  . FOOT SURGERY    . TOTAL HIP ARTHROPLASTY       A IV Location/Drains/Wounds Patient Lines/Drains/Airways Status   Active Line/Drains/Airways    Name:   Placement date:   Placement time:   Site:   Days:   Peripheral IV 04/01/19 Right Antecubital   04/01/19    2041    Antecubital   less than 1   External Urinary Catheter   04/01/19    2210    -   less than 1          Intake/Output Last 24 hours No intake or output data in the 24 hours ending 04/01/19 2308  Labs/Imaging Results for orders placed or performed during  the hospital encounter of 04/01/19 (from the past 48 hour(s))  Comprehensive metabolic panel     Status: Abnormal   Collection Time: 04/01/19  6:46 PM  Result Value Ref Range   Sodium 140 135 - 145 mmol/L   Potassium 3.0 (L) 3.5 - 5.1 mmol/L   Chloride 102 98 - 111 mmol/L   CO2 28 22 - 32 mmol/L   Glucose, Bld 139 (H) 70 - 99 mg/dL   BUN 20 8 - 23 mg/dL   Creatinine, Ser 0.95 0.44 - 1.00 mg/dL   Calcium 10.0 8.9 - 10.3 mg/dL   Total Protein 7.5 6.5 - 8.1 g/dL   Albumin 4.0 3.5 - 5.0 g/dL   AST 23 15 - 41 U/L   ALT 21 0 - 44 U/L   Alkaline Phosphatase 74 38 - 126 U/L   Total Bilirubin 0.5 0.3 - 1.2 mg/dL   GFR calc non Af Amer 56 (L) >60 mL/min   GFR calc Af Amer >60 >60 mL/min   Anion gap 10 5 - 15    Comment: Performed at [facility],  [address], [place], [place] [address]  CBC     Status: None   Collection Time: 04/01/19  6:46 PM  Result Value Ref Range   WBC 8.2 4.0 - 10.5 K/uL   RBC 4.74 3.87 - 5.11 MIL/uL   Hemoglobin 14.0 12.0 - 15.0 g/dL   HCT 42.7 36.0 - 46.0 %   MCV 90.1 80.0 - 100.0 fL   MCH 29.5 26.0 - 34.0 pg   MCHC 32.8 30.0 - 36.0 g/dL   RDW 13.7 11.5 - 15.5 %   Platelets 324 150 - 400 K/uL   nRBC 0.0 0.0 - 0.2 %    Comment: Performed at [facility], [place] [address], [place], [place] [address]  CBG monitoring, ED     Status: Abnormal   Collection Time: 04/01/19  7:42 PM  Result Value Ref Range   Glucose-Capillary 120 (H) 70 - 99 mg/dL  Magnesium     Status: None   Collection Time: 04/01/19  7:54 PM  Result Value Ref Range   Magnesium 1.9 1.7 - 2.4 mg/dL    Comment: Performed at [facility], [place] [address], [place], [place] [address]  TSH     Status: Abnormal   Collection Time: 04/01/19  7:58 PM  Result Value Ref Range   TSH 6.498 (H) 0.350 - 4.500 uIU/mL    Comment: Performed by a 3rd Generation assay with a functional sensitivity of <=0.01 uIU/mL. Performed at [facility], [place] [address], [place],  [address]    Ct Head Wo Contrast  Result Date: 04/01/2019 CLINICAL DATA:  Altered level of consciousness (LOC), unexplained. Dizziness. EXAM: CT HEAD WITHOUT CONTRAST TECHNIQUE: Contiguous axial images were obtained from the base of the skull through the vertex without intravenous contrast. COMPARISON:  Brain MRI 03/06/2019 FINDINGS: Brain: Generalized atrophy, unchanged from prior. Moderate chronic small vessel ischemia, also unchanged. No intracranial hemorrhage, mass effect, or midline shift. No hydrocephalus. The basilar cisterns are patent. No evidence of territorial infarct or acute ischemia. No extra-axial or intracranial fluid collection. Vascular: Atherosclerosis of skullbase vasculature without hyperdense vessel or abnormal calcification. Skull: No fracture or focal  lesion. Sinuses/Orbits: Trace mucosal thickening of left maxillary sinus. Trace opacification of right mastoid air cells. Bilateral cataract resection Other: None. IMPRESSION: 1. No acute intracranial abnormality. 2. Unchanged atrophy and chronic small vessel ischemia. Electronically Signed  By: Keith Rake M.D.   On: 04/01/2019 21:49   Dg Chest Portable 1 View  Result Date: 04/01/2019 CLINICAL DATA:  Weakness. EXAM: PORTABLE CHEST 1 VIEW COMPARISON:  CT chest dated 02/10/2018. FINDINGS: The heart size is stable. There is no pneumothorax. No large pleural effusion. Degenerative changes are noted of both glenohumeral joints. There is no acute osseous abnormality. The patient's previously noted pulmonary nodules are likely too small to appreciate on this exam. There are aortic calcifications. IMPRESSION: No active disease. Electronically Signed   By: Constance Holster M.D.   On: 04/01/2019 20:23    Pending Labs Unresulted Labs (From admission, onward)    Start     Ordered   04/01/19 2209  SARS Coronavirus 2  Once,   STAT     04/01/19 2209   04/01/19 1954  Urinalysis, Routine w reflex microscopic  ONCE - STAT,   STAT     04/01/19 1953          Vitals/Pain Today's Vitals   04/01/19 2245 04/01/19 2250 04/01/19 2251 04/01/19 2300  BP: (!) 157/67     Pulse: (!) 42 (!) 40 (!) 40 (!) 41  Resp: 12 13 11 12   Temp:      TempSrc:      SpO2: 97% 98% 98% 97%  PainSc:        Isolation Precautions No active isolations  Medications Medications  sodium chloride flush (NS) 0.9 % injection 3 mL (3 mLs Intravenous Not Given 04/01/19 1939)  potassium chloride 10 mEq in 100 mL IVPB (0 mEq Intravenous Stopped 04/01/19 2209)  atropine 1 MG/10ML injection 0.5 mg (0.5 mg Intravenous Given 04/01/19 2222)    Mobility walks Low fall risk   Focused Assessments Cardiac Assessment Handoff:    No results found for: CKTOTAL, CKMB, CKMBINDEX, TROPONINI No results found for: DDIMER Does the Patient  currently have chest pain? No      R Recommendations: See Admitting Provider Note  Report given to:   Additional Notes:

## 2019-04-02 ENCOUNTER — Encounter (HOSPITAL_COMMUNITY): Payer: Self-pay | Admitting: Internal Medicine

## 2019-04-02 DIAGNOSIS — G9340 Encephalopathy, unspecified: Secondary | ICD-10-CM

## 2019-04-02 DIAGNOSIS — I10 Essential (primary) hypertension: Secondary | ICD-10-CM | POA: Diagnosis not present

## 2019-04-02 DIAGNOSIS — T462X1A Poisoning by other antidysrhythmic drugs, accidental (unintentional), initial encounter: Secondary | ICD-10-CM | POA: Insufficient documentation

## 2019-04-02 DIAGNOSIS — R001 Bradycardia, unspecified: Secondary | ICD-10-CM

## 2019-04-02 LAB — URINALYSIS, ROUTINE W REFLEX MICROSCOPIC
Bacteria, UA: NONE SEEN
Bilirubin Urine: NEGATIVE
Glucose, UA: NEGATIVE mg/dL
Ketones, ur: NEGATIVE mg/dL
Nitrite: NEGATIVE
Protein, ur: NEGATIVE mg/dL
Specific Gravity, Urine: 1.004 — ABNORMAL LOW (ref 1.005–1.030)
pH: 7 (ref 5.0–8.0)

## 2019-04-02 LAB — TROPONIN I
Troponin I: <0.03 ng/mL (ref <0.03)
Troponin I: <0.03 ng/mL (ref <0.03)

## 2019-04-02 LAB — CBC WITH DIFFERENTIAL/PLATELET
Abs Immature Granulocytes: 0.02 10*3/uL (ref 0.00–0.07)
Basophils Absolute: 0 10*3/uL (ref 0.0–0.1)
Basophils Relative: 1 %
Eosinophils Absolute: 0.1 10*3/uL (ref 0.0–0.5)
Eosinophils Relative: 1 %
HCT: 39.2 % (ref 36.0–46.0)
Hemoglobin: 13 g/dL (ref 12.0–15.0)
Immature Granulocytes: 0 %
Lymphocytes Relative: 35 %
Lymphs Abs: 2.6 10*3/uL (ref 0.7–4.0)
MCH: 29.5 pg (ref 26.0–34.0)
MCHC: 33.2 g/dL (ref 30.0–36.0)
MCV: 88.9 fL (ref 80.0–100.0)
Monocytes Absolute: 0.6 10*3/uL (ref 0.1–1.0)
Monocytes Relative: 7 %
Neutro Abs: 4.1 10*3/uL (ref 1.7–7.7)
Neutrophils Relative %: 56 %
Platelets: 267 10*3/uL (ref 150–400)
RBC: 4.41 MIL/uL (ref 3.87–5.11)
RDW: 13.5 % (ref 11.5–15.5)
WBC: 7.4 10*3/uL (ref 4.0–10.5)
nRBC: 0 % (ref 0.0–0.2)

## 2019-04-02 LAB — COMPREHENSIVE METABOLIC PANEL
ALT: 18 U/L (ref 0–44)
AST: 20 U/L (ref 15–41)
Albumin: 3.4 g/dL — ABNORMAL LOW (ref 3.5–5.0)
Alkaline Phosphatase: 64 U/L (ref 38–126)
Anion gap: 10 (ref 5–15)
BUN: 17 mg/dL (ref 8–23)
CO2: 26 mmol/L (ref 22–32)
Calcium: 9.2 mg/dL (ref 8.9–10.3)
Chloride: 107 mmol/L (ref 98–111)
Creatinine, Ser: 0.99 mg/dL (ref 0.44–1.00)
GFR calc Af Amer: >60 mL/min (ref >60)
GFR calc non Af Amer: 53 mL/min — ABNORMAL LOW (ref >60)
Glucose, Bld: 109 mg/dL — ABNORMAL HIGH (ref 70–99)
Potassium: 3.1 mmol/L — ABNORMAL LOW (ref 3.5–5.1)
Sodium: 143 mmol/L (ref 135–145)
Total Bilirubin: 0.7 mg/dL (ref 0.3–1.2)
Total Protein: 6.8 g/dL (ref 6.5–8.1)

## 2019-04-02 LAB — MAGNESIUM: Magnesium: 1.7 mg/dL (ref 1.7–2.4)

## 2019-04-02 LAB — AMMONIA: Ammonia: 21 umol/L (ref 9–35)

## 2019-04-02 MED ORDER — ONDANSETRON HCL 4 MG PO TABS: 4.0000 mg | ORAL_TABLET | Freq: Four times a day (QID) | ORAL | Status: DC | PRN

## 2019-04-02 MED ORDER — POTASSIUM CHLORIDE CRYS ER 20 MEQ PO TBCR
20.0000 meq | EXTENDED_RELEASE_TABLET | Freq: Once | ORAL | Status: AC
  Administered 2019-04-02: 20 meq via ORAL
  Filled 2019-04-02: qty 1

## 2019-04-02 MED ORDER — LEVOTHYROXINE SODIUM 50 MCG PO TABS
50.0000 ug | ORAL_TABLET | Freq: Every day | ORAL | Status: DC
  Administered 2019-04-02: 50 ug via ORAL
  Filled 2019-04-02: qty 1

## 2019-04-02 MED ORDER — VALACYCLOVIR HCL 500 MG PO TABS
500.0000 mg | ORAL_TABLET | Freq: Every day | ORAL | Status: DC
  Administered 2019-04-02: 500 mg via ORAL
  Filled 2019-04-02: qty 1

## 2019-04-02 MED ORDER — ONDANSETRON HCL 4 MG/2ML IJ SOLN: 4.0000 mg | Freq: Four times a day (QID) | INTRAMUSCULAR | Status: DC | PRN

## 2019-04-02 MED ORDER — HYDRALAZINE HCL 20 MG/ML IJ SOLN: 10.0000 mg | INTRAMUSCULAR | Status: DC | PRN

## 2019-04-02 MED ORDER — ACETAMINOPHEN 650 MG RE SUPP: 650.0000 mg | Freq: Four times a day (QID) | RECTAL | Status: DC | PRN

## 2019-04-02 MED ORDER — ENOXAPARIN SODIUM 40 MG/0.4ML ~~LOC~~ SOLN
40.0000 mg | SUBCUTANEOUS | Status: DC
  Administered 2019-04-02: 40 mg via SUBCUTANEOUS
  Filled 2019-04-02: qty 0.4

## 2019-04-02 MED ORDER — ATENOLOL 25 MG PO TABS: 12.5000 mg | ORAL_TABLET | Freq: Every day | ORAL | 0 refills | Status: DC

## 2019-04-02 MED ORDER — PRAVASTATIN SODIUM 40 MG PO TABS: 80.0000 mg | ORAL_TABLET | Freq: Every day | ORAL | Status: DC

## 2019-04-02 MED ORDER — ACETAMINOPHEN 325 MG PO TABS: 650.0000 mg | ORAL_TABLET | Freq: Four times a day (QID) | ORAL | Status: DC | PRN

## 2019-04-02 NOTE — Telephone Encounter (Signed)
Will monitor for ED arrival.  

## 2019-04-02 NOTE — Discharge Summary (Signed)
Physician Discharge Summary  Felicia Kelly CXK:[id number] DOB: 1936-05-01 DOA: 04/01/2019  PCP: [name], DO  Admit date: 04/01/2019 Discharge date: 04/02/2019  Admitted From: Home Disposition: Home  Recommendations for Outpatient Follow-up:  1. Follow up with PCP in 2 to 3 days  2. Please obtain BMP/CBC in one week  Discharge Condition: Stable CODE STATUS: Full Diet recommendation: As tolerated  Brief/Interim Summary: Felicia Kelly is a 83 y.o. female with history of hypertension, hyperlipidemia, hypothyroidism and recently diagnosed mild cognitive impairment placed on Aricept was evaluated by neurologist on Feb 14, 2019 was not feeling well after she woke up last evening and states that she has been little bit weak confused and she thinks she may have taken her medication twice today.  She feels weak on walking.  And was brought to the ER.  Denies any chest pain shortness of breath headache nausea vomiting fever chills dysuria or discharges or diarrhea.In the ER patient is oriented to name and place.  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  CT head was unremarkable EKG shows sinus bradycardia with heart rate around 45 bpm.  Patient is taking beta-blockers and Aricept.  TSH is around 6.498 UA is unremarkable.  COVID-19 test was negative.  Chest x-ray did not show anything acute.  Patient was given 1 dose of atropine by the ER physician but her heart rate has not increased after that.  Presently at the time of my exam patient's heart rate is around 45 to 50 bpm blood pressure is in the normal side.  Patient admitted for bradycardia with possible overdose of medication and confusion.  Patient admitted as above for accidental overdose of home medications, concern for Aricept and atenolol most notably.  Patient now resolved back to baseline, tolerating p.o. well ambulating with heart rate in the low 50s, is asymptomatic.  Lengthy  discussion at bedside today about close follow-up with PCP for ongoing medication management as patient may benefit from reduction in home medication burden given her advancing age, risk for polypharmacy and borderline low blood pressure and heart rate here off all home medications.  We discussed with the patient holding home medications further.additional 4 hours, to contact her PCP tomorrow Thursday the 18th for further evaluation over the phone or video versus in person visit in the office before reinstituting her home medications.  Otherwise stable and agreeable for discharge home.  Discharge Diagnoses:  Principal Problem:   Bradycardia Active Problems:   Benign essential hypertension   Hypothyroidism   Acute encephalopathy  Symptomatic sinus bradycardia with unintentional overdose At risk for polypharmacy Subclinical hypothyroidism Discharge Instructions    Diet - low sodium heart healthy   Complete by: As directed    Increase activity slowly   Complete by: As directed        Allergies as of 04/02/2019      Reactions   Penicillins Swelling, Rash   ALL CILLINS   Tetracycline Itching   Atorvastatin Other (See Comments)   Leg cramps Leg cramps   Bactrim [sulfamethoxazole-trimethoprim] Itching   Celecoxib Other (See Comments)   Memory disturbance, slowed thinking Memory disturbance, slowed thinking   Niacin Other (See Comments)   Caused elevated BP   Niaspan [niacin Er]    Elevated BP   Rosuvastatin Other (See Comments)   Elevated CPK   Simvastatin Other (See Comments)   Leg cramps Leg cramps   Tetracyclines & Related Itching  Medication List    TAKE these medications   atenolol 25 MG tablet Commonly known as: TENORMIN Take 0.5 tablets (12.5 mg total) by mouth daily. Start taking on: April 04, 2019 What changed:   See the new instructions.  These instructions start on April 04, 2019. If you are unsure what to do until then, ask your doctor or other care  provider.   benzonatate 100 MG capsule Commonly known as: TESSALON Take 1 capsule (100 mg total) by mouth 2 (two) times daily as needed for cough.   donepezil 10 MG tablet Commonly known as: ARICEPT Take 1/2 tablet daily for 2 weeks, then increase to 1 tablet daily What changed:   how much to take  how to take this  when to take this  additional instructions   levothyroxine 50 MCG tablet Commonly known as: SYNTHROID Take 50 mcg by mouth daily before breakfast.   losartan-hydrochlorothiazide 100-12.5 MG tablet Commonly known as: HYZAAR Take 1 tablet by mouth daily.   pravastatin 80 MG tablet Commonly known as: PRAVACHOL TAKE 1 TABLET (80 MG TOTAL) BY MOUTH DAILY.   valACYclovir 500 MG tablet Commonly known as: VALTREX TAKE 1 TABLET (500 MG TOTAL) BY MOUTH DAILY. What changed: See the new instructions.       Allergies  Allergen Reactions  . Penicillins Swelling and Rash    ALL CILLINS  . Tetracycline Itching  . Atorvastatin Other (See Comments)    Leg cramps Leg cramps  . Bactrim [Sulfamethoxazole-Trimethoprim] Itching  . Celecoxib Other (See Comments)    Memory disturbance, slowed thinking Memory disturbance, slowed thinking  . Niacin Other (See Comments)    Caused elevated BP  . Niaspan [Niacin Er]     Elevated BP  . Rosuvastatin Other (See Comments)    Elevated CPK  . Simvastatin Other (See Comments)    Leg cramps Leg cramps  . Tetracyclines & Related Itching    Procedures/Studies: Ct Head Wo Contrast  Result Date: 04/01/2019 CLINICAL DATA:  Altered level of consciousness (LOC), unexplained. Dizziness. EXAM: CT HEAD WITHOUT CONTRAST TECHNIQUE: Contiguous axial images were obtained from the base of the skull through the vertex without intravenous contrast. COMPARISON:  Brain MRI 03/06/2019 FINDINGS: Brain: Generalized atrophy, unchanged from prior. Moderate chronic small vessel ischemia, also unchanged. No intracranial hemorrhage, mass effect, or  midline shift. No hydrocephalus. The basilar cisterns are patent. No evidence of territorial infarct or acute ischemia. No extra-axial or intracranial fluid collection. Vascular: Atherosclerosis of skullbase vasculature without hyperdense vessel or abnormal calcification. Skull: No fracture or focal lesion. Sinuses/Orbits: Trace mucosal thickening of left maxillary sinus. Trace opacification of right mastoid air cells. Bilateral cataract resection Other: None. IMPRESSION: 1. No acute intracranial abnormality. 2. Unchanged atrophy and chronic small vessel ischemia. Electronically Signed   By: Keith Rake M.D.   On: 04/01/2019 21:49   Mr Brain Wo Contrast  Result Date: 03/07/2019 CLINICAL DATA:  Memory loss. EXAM: MRI HEAD WITHOUT CONTRAST TECHNIQUE: Multiplanar, multiecho pulse sequences of the brain and surrounding structures were obtained without intravenous contrast. COMPARISON:  None. FINDINGS: Brain: There is no evidence of acute infarct, intracranial hemorrhage, mass, midline shift, or extra-axial fluid collection. Patchy T2 hyperintensities in the cerebral white matter nonspecific but compatible with moderate chronic small vessel ischemic disease. There is moderate cerebral atrophy without lobar predominance. Vascular: Major intracranial vascular flow voids are preserved. Skull and upper cervical spine: Unremarkable bone marrow signal. Sinuses/Orbits: Bilateral cataract extraction. Minimal left maxillary sinus mucosal thickening. Clear mastoid air cells.  Other: None. IMPRESSION: 1. No acute intracranial abnormality. 2. Moderate chronic small vessel ischemic disease and generalized cerebral atrophy. Electronically Signed   By: Logan Bores M.D.   On: 03/07/2019 08:34   Dg Chest Portable 1 View  Result Date: 04/01/2019 CLINICAL DATA:  Weakness. EXAM: PORTABLE CHEST 1 VIEW COMPARISON:  CT chest dated 02/10/2018. FINDINGS: The heart size is stable. There is no pneumothorax. No large pleural effusion.  Degenerative changes are noted of both glenohumeral joints. There is no acute osseous abnormality. The patient's previously noted pulmonary nodules are likely too small to appreciate on this exam. There are aortic calcifications. IMPRESSION: No active disease. Electronically Signed   By: Constance Holster M.D.   On: 04/01/2019 20:23    Subjective: Patient symptoms resolved, back to baseline, no longer symptomatic with ambulation today requesting discharge home which is certainly reasonable given the resolution of symptoms.  Discharge Exam: Vitals:   04/02/19 0630 04/02/19 0931  BP: (!) 132/50 (!) 129/53  Pulse: (!) 45   Resp: 17   Temp: 97.7 F (36.5 C)   SpO2: 99% 100%   Vitals:   04/02/19 0030 04/02/19 0134 04/02/19 0630 04/02/19 0931  BP: (!) 135/50 (!) 158/59 (!) 132/50 (!) 129/53  Pulse: (!) 45 (!) 42 (!) 45   Resp: 11 20 17    Temp:  (!) 97.5 F (36.4 C) 97.7 F (36.5 C)   TempSrc:  Oral Oral   SpO2: 96% 99% 99% 100%  Weight:  71.4 kg    Height:  5\' 2"  (1.575 m)      General:  Pleasantly resting in bed, No acute distress. HEENT:  Normocephalic atraumatic.  Sclerae nonicteric, noninjected.  Extraocular movements intact bilaterally. Neck:  Without mass or deformity.  Trachea is midline. Lungs:  Clear to auscultate bilaterally without rhonchi, wheeze, or rales. Heart:  Regular rate and rhythm.  Without murmurs, rubs, or gallops. Abdomen:  Soft, nontender, nondistended.  Without guarding or rebound. Extremities: Without cyanosis, clubbing, edema, or obvious deformity. Vascular:  Dorsalis pedis and posterior tibial pulses palpable bilaterally. Skin:  Warm and dry, no erythema, no ulcerations.   The results of significant diagnostics from this hospitalization (including imaging, microbiology, ancillary and laboratory) are listed below for reference.     Microbiology: Recent Results (from the past 240 hour(s))  SARS Coronavirus 2     Status: None   Collection Time:  04/01/19 10:09 PM  Result Value Ref Range Status   SARS Coronavirus 2 NOT DETECTED NOT DETECTED Final    Comment: (NOTE) SARS-CoV-2 target nucleic acids are NOT DETECTED. The SARS-CoV-2 RNA is generally detectable in upper and lower respiratory specimens during the acute phase of infection.  Negative  results do not preclude SARS-CoV-2 infection, do not rule out co-infections with other pathogens, and should not be used as the sole basis for treatment or other patient management decisions.  Negative results must be combined with clinical observations, patient history, and epidemiological information. The expected result is Not Detected. Fact Sheet for Patients: http://www.biofiredefense.com/wp-content/uploads/2020/03/BIOFIRE-COVID -19-patients.pdf Fact Sheet for Healthcare Providers: http://www.biofiredefense.com/wp-content/uploads/2020/03/BIOFIRE-COVID -19-hcp.pdf This test is not yet approved or cleared by the [place] and  has been authorized for detection and/or diagnosis of SARS-CoV-2 by FDA under an Emergency Use Authorization (EUA).  This EUA will remain in effec t (meaning this test can be used) for the duration of  the COVID-19 declaration under Section 564(b)(1) of the Act, 21 U.S.C. section 360bbb-3(b)(1), unless the authorization is terminated or revoked sooner. Performed at  [facility], [place] [address], [place], [place] [address]      Labs: BNP (last 3 results) No results for input(s): BNP in the last 8760 hours. Basic Metabolic Panel: Recent Labs  Lab 04/01/19 1846 04/01/19 1954 04/02/19 0245  NA 140  --  143  K 3.0*  --  3.1*  CL 102  --  107  CO2 28  --  26  GLUCOSE 139*  --  109*  BUN 20  --  17  CREATININE 0.95  --  0.99  CALCIUM 10.0  --  9.2  MG  --  1.9 1.7   Liver Function Tests: Recent Labs  Lab 04/01/19 1846 04/02/19 0245  AST 23 20  ALT 21 18  ALKPHOS 74 64  BILITOT 0.5 0.7  PROT 7.5 6.8  ALBUMIN 4.0 3.4*   No  results for input(s): LIPASE, AMYLASE in the last 168 hours. Recent Labs  Lab 04/02/19 1057  AMMONIA 21   CBC: Recent Labs  Lab 04/01/19 1846 04/02/19 0245  WBC 8.2 7.4  NEUTROABS  --  4.1  HGB 14.0 13.0  HCT 42.7 39.2  MCV 90.1 88.9  PLT 324 267   Cardiac Enzymes: Recent Labs  Lab 04/02/19 0245 04/02/19 1057  TROPONINI <0.03 <0.03   BNP: Invalid input(s): POCBNP CBG: Recent Labs  Lab 04/01/19 1942  GLUCAP 120*   D-Dimer No results for input(s): DDIMER in the last 72 hours. Hgb A1c No results for input(s): HGBA1C in the last 72 hours. Lipid Profile No results for input(s): CHOL, HDL, LDLCALC, TRIG, CHOLHDL, LDLDIRECT in the last 72 hours. Thyroid function studies Recent Labs    04/01/19 1958  TSH 6.498*   Anemia work up No results for input(s): VITAMINB12, FOLATE, FERRITIN, TIBC, IRON, RETICCTPCT in the last 72 hours. Urinalysis    Component Value Date/Time   COLORURINE STRAW (A) 04/01/2019 2342   APPEARANCEUR CLEAR 04/01/2019 2342   LABSPEC 1.004 (L) 04/01/2019 2342   PHURINE 7.0 04/01/2019 2342   GLUCOSEU NEGATIVE 04/01/2019 2342   HGBUR MODERATE (A) 04/01/2019 2342   BILIRUBINUR NEGATIVE 04/01/2019 2342   BILIRUBINUR negative 10/15/2017 1106   KETONESUR NEGATIVE 04/01/2019 2342   PROTEINUR NEGATIVE 04/01/2019 2342   UROBILINOGEN 0.2 10/15/2017 1106   NITRITE NEGATIVE 04/01/2019 2342   LEUKOCYTESUR TRACE (A) 04/01/2019 2342   Sepsis Labs Invalid input(s): PROCALCITONIN,  WBC,  LACTICIDVEN Microbiology Recent Results (from the past 240 hour(s))  SARS Coronavirus 2     Status: None   Collection Time: 04/01/19 10:09 PM  Result Value Ref Range Status   SARS Coronavirus 2 NOT DETECTED NOT DETECTED Final    Comment: (NOTE) SARS-CoV-2 target nucleic acids are NOT DETECTED. The SARS-CoV-2 RNA is generally detectable in upper and lower respiratory specimens during the acute phase of infection.  Negative  results do not preclude SARS-CoV-2  infection, do not rule out co-infections with other pathogens, and should not be used as the sole basis for treatment or other patient management decisions.  Negative results must be combined with clinical observations, patient history, and epidemiological information. The expected result is Not Detected. Fact Sheet for Patients: http://www.biofiredefense.com/wp-content/uploads/2020/03/BIOFIRE-COVID -19-patients.pdf Fact Sheet for Healthcare Providers: http://www.biofiredefense.com/wp-content/uploads/2020/03/BIOFIRE-COVID -19-hcp.pdf This test is not yet approved or cleared by the [place] and  has been authorized for detection and/or diagnosis of SARS-CoV-2 by FDA under an Emergency Use Authorization (EUA).  This EUA will remain in effec t (meaning this test can be used) for the duration of  the COVID-19 declaration under Section 564(b)(1) of the Act, 21 U.S.C. section 360bbb-3(b)(1), unless the authorization is terminated or revoked sooner. Performed at [facility], [place] [address], [place], [place] [address]      Time coordinating discharge: Over 30 minutes  SIGNED:   Little Ishikawa, DO [facility] 04/02/2019, 1:32 PM Pager   If 7PM-7AM, please contact night-coverage [email or web address] Password [email or web address]

## 2019-04-02 NOTE — Telephone Encounter (Signed)
Pt evaluated and admitted to [facility].

## 2019-04-02 NOTE — Progress Notes (Signed)
Pt ambulated in room and to bathroom. Heart Rate remained above 45 bpm with no signs of distress or complaints voiced.

## 2019-04-02 NOTE — H&P (Addendum)
History and Physical    Felicia Kelly DXI:[id number] DOB: 1936/05/03 DOA: 04/01/2019  PCP: [name], DO   Patient coming from: Home.  Chief Complaint: May have doubled medication dose.  HPI: Felicia Kelly is a 83 y.o. female with history of hypertension, hyperlipidemia, hypothyroidism and recently diagnosed mild cognitive impairment placed on Aricept was evaluated by neurologist on Feb 14, 2019 was not feeling well after she woke up last evening and states that she has been little bit weak confused and she thinks she may have taken her medication twice today.  She feels weak on walking.  And was brought to the ER.  Denies any chest pain shortness of breath headache nausea vomiting fever chills dysuria or discharges or diarrhea.  ED Course: In the ER patient is oriented to name and place.  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  CT head was unremarkable EKG shows sinus bradycardia with heart rate around 45 bpm.  Patient is taking beta-blockers and Aricept.  TSH is around 6.498 UA is unremarkable.  COVID-19 test was negative.  Chest x-ray did not show anything acute.  Patient was given 1 dose of atropine by the ER physician but her heart rate has not increased after that.  Presently at the time of my exam patient's heart rate is around 45 to 50 bpm blood pressure is in the normal side.  Patient admitted for bradycardia with possible overdose of medication and confusion.  Review of Systems: As per HPI, rest all negative.   Past Medical History:  Diagnosis Date  . Aortic atherosclerosis ([place])    a. noted on CT 01/2018.  . Arthritis   . Colonic polyp   . Coronary atherosclerosis    a. noted on CT 01/2018.  Marland Kitchen Genital herpes   . Hyperlipidemia   . Hypertension   . Mild carotid artery disease ([place])   . Osteoporosis   . Pulmonary nodules    a. folloewd by PCP by serial Ct.  . Thyroid disease    seeing Dr. Buddy Duty  . Tremor      Past Surgical History:  Procedure Laterality Date  . ABDOMINAL HYSTERECTOMY  1988   no cancer - reports total with bilat oophorectomy  . APPENDECTOMY  1988  . BREAST CYST EXCISION Bilateral   . BREAST SURGERY  1985   biopsy  . FOOT SURGERY    . TOTAL HIP ARTHROPLASTY       reports that she quit smoking about 40 years ago. She has never used smokeless tobacco. She reports that she does not drink alcohol or use drugs.  Allergies  Allergen Reactions  . Penicillins Swelling and Rash    ALL CILLINS  . Tetracycline Itching  . Atorvastatin Other (See Comments)    Leg cramps Leg cramps  . Bactrim [Sulfamethoxazole-Trimethoprim] Itching  . Celecoxib Other (See Comments)    Memory disturbance, slowed thinking Memory disturbance, slowed thinking  . Niacin Other (See Comments)    Caused elevated BP  . Niaspan [Niacin Er]     Elevated BP  . Rosuvastatin Other (See Comments)    Elevated CPK  . Simvastatin Other (See Comments)    Leg cramps Leg cramps  . Tetracyclines & Related Itching    Family History  Problem Relation Age of Onset  . Cancer Father        pancreatic  . Pancreatic cancer Father   . Heart disease Father   .  Heart attack Sister 49  . Arthritis Mother   . Hyperlipidemia Mother   . Hypertension Mother   . Heart disease Mother   . Diabetes Mother   . Liver disease Brother   . Breast cancer Neg Hx     Prior to Admission medications   Medication Sig Start Date End Date Taking? Authorizing Provider  atenolol (TENORMIN) 25 MG tablet TAKE 1/2 TABLETS (12.5 MG TOTAL) BY MOUTH DAILY. Patient taking differently: Take 12.5 mg by mouth daily.  06/11/18  Yes Colin Benton R, DO  donepezil (ARICEPT) 10 MG tablet Take 1/2 tablet daily for 2 weeks, then increase to 1 tablet daily Patient taking differently: Take 10 mg by mouth daily.  02/14/19  Yes Cameron Sprang, MD  levothyroxine (SYNTHROID, LEVOTHROID) 50 MCG tablet Take 50 mcg by mouth daily before breakfast.   Yes  [provider]  losartan-hydrochlorothiazide (HYZAAR) 100-12.5 MG tablet Take 1 tablet by mouth daily. 03/14/19  Yes Koberlein, Junell C, MD  pravastatin (PRAVACHOL) 80 MG tablet TAKE 1 TABLET (80 MG TOTAL) BY MOUTH DAILY. 12/10/18  Yes [name], DO  valACYclovir (VALTREX) 500 MG tablet TAKE 1 TABLET (500 MG TOTAL) BY MOUTH DAILY. Patient taking differently: Take 500 mg by mouth daily.  11/19/18  Yes Colin Benton R, DO  benzonatate (TESSALON) 100 MG capsule Take 1 capsule (100 mg total) by mouth 2 (two) times daily as needed for cough. Patient not taking: Reported on 02/14/2019 09/01/16   [name], DO    Physical Exam: Vitals:   04/01/19 2250 04/01/19 2251 04/01/19 2300 04/01/19 2315  BP:    (!) 152/66  Pulse: (!) 40 (!) 40 (!) 41 (!) 42  Resp: 13 11 12 10   Temp:      TempSrc:      SpO2: 98% 98% 97% 97%      Constitutional: Moderately built and nourished. Vitals:   04/01/19 2250 04/01/19 2251 04/01/19 2300 04/01/19 2315  BP:    (!) 152/66  Pulse: (!) 40 (!) 40 (!) 41 (!) 42  Resp: 13 11 12 10   Temp:      TempSrc:      SpO2: 98% 98% 97% 97%   Eyes: Anicteric no pallor. ENMT: No discharge from the ears eyes nose and mouth. Neck: No mass felt.  No neck rigidity.  No JVD appreciated. Respiratory: No rhonchi or crepitations. Cardiovascular: S1-S2 heard. Abdomen: Soft nontender bowel sounds present. Musculoskeletal: No edema.  No joint effusion. Skin: No rash. Neurologic: Alert awake oriented to her name and place moves all extremities 5 x 5. Psychiatric: Oriented to her name and place.   Labs on Admission: I have personally reviewed following labs and imaging studies  CBC: Recent Labs  Lab 04/01/19 1846  WBC 8.2  HGB 14.0  HCT 42.7  MCV 90.1  PLT 308   Basic Metabolic Panel: Recent Labs  Lab 04/01/19 1846 04/01/19 1954  NA 140  --   K 3.0*  --   CL 102  --   CO2 28  --   GLUCOSE 139*  --   BUN 20  --   CREATININE 0.95  --   CALCIUM 10.0  --    MG  --  1.9   GFR: CrCl cannot be calculated (Unknown ideal weight.). Liver Function Tests: Recent Labs  Lab 04/01/19 1846  AST 23  ALT 21  ALKPHOS 74  BILITOT 0.5  PROT 7.5  ALBUMIN 4.0   No results for input(s):  LIPASE, AMYLASE in the last 168 hours. No results for input(s): AMMONIA in the last 168 hours. Coagulation Profile: No results for input(s): INR, PROTIME in the last 168 hours. Cardiac Enzymes: No results for input(s): CKTOTAL, CKMB, CKMBINDEX, TROPONINI in the last 168 hours. BNP (last 3 results) No results for input(s): PROBNP in the last 8760 hours. HbA1C: No results for input(s): HGBA1C in the last 72 hours. CBG: Recent Labs  Lab 04/01/19 1942  GLUCAP 120*   Lipid Profile: No results for input(s): CHOL, HDL, LDLCALC, TRIG, CHOLHDL, LDLDIRECT in the last 72 hours. Thyroid Function Tests: Recent Labs    04/01/19 1958  TSH 6.498*   Anemia Panel: No results for input(s): VITAMINB12, FOLATE, FERRITIN, TIBC, IRON, RETICCTPCT in the last 72 hours. Urine analysis:    Component Value Date/Time   BILIRUBINUR negative 10/15/2017 1106   PROTEINUR negative 10/15/2017 1106   UROBILINOGEN 0.2 10/15/2017 1106   NITRITE negative 10/15/2017 1106   LEUKOCYTESUR Negative 10/15/2017 1106   Sepsis Labs: @LABRCNTIP (procalcitonin:4,lacticidven:4) ) Recent Results (from the past 240 hour(s))  SARS Coronavirus 2     Status: None   Collection Time: 04/01/19 10:09 PM  Result Value Ref Range Status   SARS Coronavirus 2 NOT DETECTED NOT DETECTED Final    Comment: (NOTE) SARS-CoV-2 target nucleic acids are NOT DETECTED. The SARS-CoV-2 RNA is generally detectable in upper and lower respiratory specimens during the acute phase of infection.  Negative  results do not preclude SARS-CoV-2 infection, do not rule out co-infections with other pathogens, and should not be used as the sole basis for treatment or other patient management decisions.  Negative results must be  combined with clinical observations, patient history, and epidemiological information. The expected result is Not Detected. Fact Sheet for Patients: http://www.biofiredefense.com/wp-content/uploads/2020/03/BIOFIRE-COVID -19-patients.pdf Fact Sheet for Healthcare Providers: http://www.biofiredefense.com/wp-content/uploads/2020/03/BIOFIRE-COVID -19-hcp.pdf This test is not yet approved or cleared by the [place] and  has been authorized for detection and/or diagnosis of SARS-CoV-2 by FDA under an Emergency Use Authorization (EUA).  This EUA will remain in effec t (meaning this test can be used) for the duration of  the COVID-19 declaration under Section 564(b)(1) of the Act, 21 U.S.C. section 360bbb-3(b)(1), unless the authorization is terminated or revoked sooner. Performed at [facility], [place] [address], [place],  [address]      Radiological Exams on Admission: Ct Head Wo Contrast  Result Date: 04/01/2019 CLINICAL DATA:  Altered level of consciousness (LOC), unexplained. Dizziness. EXAM: CT HEAD WITHOUT CONTRAST TECHNIQUE: Contiguous axial images were obtained from the base of the skull through the vertex without intravenous contrast. COMPARISON:  Brain MRI 03/06/2019 FINDINGS: Brain: Generalized atrophy, unchanged from prior. Moderate chronic small vessel ischemia, also unchanged. No intracranial hemorrhage, mass effect, or midline shift. No hydrocephalus. The basilar cisterns are patent. No evidence of territorial infarct or acute ischemia. No extra-axial or intracranial fluid collection. Vascular: Atherosclerosis of skullbase vasculature without hyperdense vessel or abnormal calcification. Skull: No fracture or focal lesion. Sinuses/Orbits: Trace mucosal thickening of left maxillary sinus. Trace opacification of right mastoid air cells. Bilateral cataract resection Other: None. IMPRESSION: 1. No acute intracranial abnormality. 2. Unchanged atrophy and chronic small  vessel ischemia. Electronically Signed   By: Keith Rake M.D.   On: 04/01/2019 21:49   Dg Chest Portable 1 View  Result Date: 04/01/2019 CLINICAL DATA:  Weakness. EXAM: PORTABLE CHEST 1 VIEW COMPARISON:  CT chest dated 02/10/2018. FINDINGS: The heart size is stable. There is no pneumothorax. No large pleural  effusion. Degenerative changes are noted of both glenohumeral joints. There is no acute osseous abnormality. The patient's previously noted pulmonary nodules are likely too small to appreciate on this exam. There are aortic calcifications. IMPRESSION: No active disease. Electronically Signed   By: Constance Holster M.D.   On: 04/01/2019 20:23    EKG: Independently reviewed.  Sinus bradycardia heart rate around 45 bpm QTC is 444 ms.  QRS is within acceptable limits.  Assessment/Plan Principal Problem:   Bradycardia Active Problems:   Benign essential hypertension   Hypothyroidism   Acute encephalopathy    1. Sinus bradycardia with unintentional drug overdose including atenolol and Aricept -we will keep patient monitor in stepdown.  Will hold off atenolol Aricept and also antihypertensive for now and closely observe. 2. Acute encephalopathy likely could be from patient's recent diagnosis of dementia/mild cognitive impairment added on patient also is mildly bradycardic.  Closely observe.  CT head is unremarkable.  Check ammonia levels. 3. Mild hypokalemia replace recheck. 4. Hypertension we will hold antihypertensives for now including ARB and hydrochlorothiazide since patient is bradycardic and did not want to drop further blood pressure.  We will keep patient on PRN IV hydralazine. 5. Hyperlipidemia on statins. 6. Hypothyroidism on Synthroid.   DVT prophylaxis: Lovenox. Code Status: Full code. Family Communication: Will need to discuss with family. Disposition Plan: Home. Consults called: None. Admission status: Observation.   Rise Patience MD [facility]  Pager [phone].  If 7PM-7AM, please contact night-coverage [email or web address] Password [email or web address]  04/02/2019, 12:15 AM

## 2019-04-03 ENCOUNTER — Encounter: Payer: Self-pay | Admitting: Family Medicine

## 2019-04-03 ENCOUNTER — Ambulatory Visit (INDEPENDENT_AMBULATORY_CARE_PROVIDER_SITE_OTHER): Payer: Medicare Other | Admitting: Family Medicine

## 2019-04-03 ENCOUNTER — Other Ambulatory Visit: Payer: Self-pay

## 2019-04-03 DIAGNOSIS — Z9189 Other specified personal risk factors, not elsewhere classified: Secondary | ICD-10-CM | POA: Diagnosis not present

## 2019-04-03 DIAGNOSIS — R410 Disorientation, unspecified: Secondary | ICD-10-CM

## 2019-04-03 DIAGNOSIS — I10 Essential (primary) hypertension: Secondary | ICD-10-CM

## 2019-04-03 NOTE — Patient Instructions (Addendum)
-  consider an electronic pill box and have someone help you with filling your pill box  -Vitamin D3 1000 IU daily  -take 1/2 tablet of the atenolol daily for 1 week, then 1/4 tablet daily for 1 week, then stop  -dispose of the hydrochlorothiazide bottle and take the combo losartan-hydrochlorthiazide one pill daily  -contact neurology for follow up   -contact your specialist about your recent thyroid level from the hospital as we discussed  -follow up in 3-4 weeks with Dr. Maudie Mercury for a recheck

## 2019-04-03 NOTE — Progress Notes (Signed)
Virtual Visit via Video Note  I connected with Felicia Kelly  on 04/03/19 at  3:40 PM EDT by a video enabled telemedicine application and verified that I am speaking with the correct person using two identifiers.  Location patient: home Location provider:work or home office Persons participating in the virtual visit: patient, provider  I discussed the limitations of evaluation and management by telemedicine and the availability of in person appointments. The patient expressed understanding and agreed to proceed.   HPI:  Felicia Kelly is a pleasant 83 y.o. with a PMH significant for aortic atherosclerosis, hypertension,  thyroid disease, mild cognitive impairment, mild carotid art dz and hyperlipidemia is seen for a hospital follow up. See transitional care phone note in Epic. Per review of discharge documents and patient: Hospitalized 6/16 to 04/02/2019 Primary admitting complaint(s): weakness and mild confusion Primary admitting diagnosis (es) and treatment: had workup in ER with CT head, labs, EKG with sinus bradycardia - was treated with atropine, per discharge notes symptoms were thought to be from polypharmacy from recent addition of aricept by neurology for the mild cog impairment and the bb. Question of accidental overdose of her medications.They held her medications and advised restarting the next day but possible reduction in medcations. Other significant diagnosis (es) and treatment: potassium mildly low on labs, TSH a little elevated at 6.4 on labs. Glucose was mildly elevated. Follow up concerns per discharge document:medications Reports today:she reports feeling great today and is oriented to person, place, time, season, month and most events. Sister is with her and reports patient fills her pill box and she noticed that patient may have been taking her medications incorrectly. She thinks she was taking a combo pill of los-hxtz, plus and additional hctz pill, two of the 1/2 tabs of  her atenolol and 2 tabs of her aricept recently. This has now been corrected. They wonder if the patient can stop the atenolol altogether to simplify her regimen - prior PCP had started it for HTN and possibly tremor - but they are not sure. He sister plans to contact her specialist regarding her thyroid lab.  BP: 134/65 HR 59  ROS: See pertinent positives and negatives per HPI.  Past Medical History:  Diagnosis Date  . Aortic atherosclerosis ([place])    a. noted on CT 01/2018.  . Arthritis   . Colonic polyp   . Coronary atherosclerosis    a. noted on CT 01/2018.  Marland Kitchen Genital herpes   . Hyperlipidemia   . Hypertension   . Mild carotid artery disease ([place])   . Osteoporosis   . Pulmonary nodules    a. folloewd by PCP by serial Ct.  . Thyroid disease    seeing Dr. Buddy Duty  . Tremor     Past Surgical History:  Procedure Laterality Date  . ABDOMINAL HYSTERECTOMY  1988   no cancer - reports total with bilat oophorectomy  . APPENDECTOMY  1988  . BREAST CYST EXCISION Bilateral   . BREAST SURGERY  1985   biopsy  . FOOT SURGERY    . TOTAL HIP ARTHROPLASTY      Family History  Problem Relation Age of Onset  . Cancer Father        pancreatic  . Pancreatic cancer Father   . Heart disease Father   . Heart attack Sister 55  . Arthritis Mother   . Hyperlipidemia Mother   . Hypertension Mother   . Heart disease Mother   . Diabetes Mother   . Liver disease  Brother   . Breast cancer Neg Hx     SOCIAL HX: see hpi   Current Outpatient Medications:  .  [START ON 04/04/2019] atenolol (TENORMIN) 25 MG tablet, Take 0.5 tablets (12.5 mg total) by mouth daily., Disp: 30 tablet, Rfl: 0 .  benzonatate (TESSALON) 100 MG capsule, Take 1 capsule (100 mg total) by mouth 2 (two) times daily as needed for cough., Disp: 20 capsule, Rfl: 0 .  donepezil (ARICEPT) 10 MG tablet, Take 1/2 tablet daily for 2 weeks, then increase to 1 tablet daily (Patient taking differently: Take 10 mg by mouth daily. ),  Disp: 30 tablet, Rfl: 11 .  doxycycline (VIBRAMYCIN) 100 MG capsule, Take 100 mg by mouth. As needed prior to dental procedures, Disp: , Rfl:  .  levothyroxine (SYNTHROID, LEVOTHROID) 50 MCG tablet, Take 50 mcg by mouth daily before breakfast., Disp: , Rfl:  .  losartan-hydrochlorothiazide (HYZAAR) 100-12.5 MG tablet, Take 1 tablet by mouth daily., Disp: 90 tablet, Rfl: 1 .  meclizine (ANTIVERT) 25 MG tablet, Take 25 mg by mouth every 6 (six) hours as needed for dizziness., Disp: , Rfl:  .  pravastatin (PRAVACHOL) 80 MG tablet, TAKE 1 TABLET (80 MG TOTAL) BY MOUTH DAILY., Disp: 90 tablet, Rfl: 1 .  valACYclovir (VALTREX) 500 MG tablet, TAKE 1 TABLET (500 MG TOTAL) BY MOUTH DAILY. (Patient taking differently: Take 500 mg by mouth daily. ), Disp: 90 tablet, Rfl: 1  EXAM:  VITALS per patient if applicable: BP: 350/09 HR 59  GENERAL: alert, oriented, appears well and in no acute distress  HEENT: atraumatic, conjunttiva clear, no obvious abnormalities on inspection of external nose and ears  NECK: normal movements of the head and neck  LUNGS: on inspection no signs of respiratory distress, breathing rate appears normal, no obvious gross SOB, gasping or wheezing  CV: no obvious cyanosis  MS: moves all visible extremities without noticeable abnormality  PSYCH/NEURO: pleasant and cooperative, no obvious depression or anxiety, speech and thought processing grossly intact  ASSESSMENT AND PLAN:  Discussed the following assessment and plan:  1. Benign essential hypertension -will simplify regimen, taper off atenolol, ensure taking only the combo of losartan hctz -this will likely correct the potassium and brady -follow up 3-4 weeks -monitor BP in interim  2. Confusion -follow up with neurology and let them know about the confusion she had with the aricept and her hospitalization -follow up with specialist regarding change in thyroid level -call if any recurrence -see below regarding  polypharm  3. At risk for polypharmacy -advised sister to assist her with filling her pill box, consider electronic pill box -went over each medication on her list to ensure they understood instructions and medications -asked sister to dispose of any bottles not on the list -advised assistant to contact pharmacy to ensure they have updated list of her medications -advised they discuss risk/vs benefit of continuing aricept with neurology    I discussed the assessment and treatment plan with the patient. The patient was provided an opportunity to ask questions and all were answered. The patient agreed with the plan and demonstrated an understanding of the instructions.   The patient was advised to call back or seek an in-person evaluation if the symptoms worsen or if the condition fails to improve as anticipated.  Patient Instructions  -consider an electronic pill box and have someone help you with filling your pill box  -Vitamin D3 1000 IU daily  -take 1/2 tablet of the atenolol daily for 1 week,  then 1/4 tablet daily for 1 week, then stop  -dispose of the hydrochlorothiazide bottle and take the combo losartan-hydrochlorthiazide one pill daily  -contact neurology for follow up   -contact your specialist about your recent thyroid level from the hospital as we discussed  -follow up in 3-4 weeks with Dr. Maudie Mercury for a recheck     Follow up instructions: Advised assistant Wendie Simmer to help patient arrange the following: -follow up with Dr. Maudie Mercury in 3-4 weeks, Hypertension -call her pharmacy - they have been refilling her combo BP med los-hctz and a separate hctz even though it was [facility]! Make sure the only BP med the will be refilling is los-hctz. DC hctz and atenolol and any other BP meds they may have continued despite [place] stopping them.  [name], DO

## 2019-04-04 ENCOUNTER — Telehealth: Payer: Self-pay | Admitting: *Deleted

## 2019-04-04 ENCOUNTER — Emergency Department (HOSPITAL_COMMUNITY)
Admission: EM | Admit: 2019-04-04 | Discharge: 2019-04-04 | Disposition: A | Discharge status: Home / Self Care | Payer: Medicare Other | Attending: Emergency Medicine | Admitting: Emergency Medicine

## 2019-04-04 ENCOUNTER — Emergency Department (HOSPITAL_COMMUNITY): Payer: Medicare Other

## 2019-04-04 ENCOUNTER — Encounter (HOSPITAL_COMMUNITY): Payer: Self-pay

## 2019-04-04 ENCOUNTER — Other Ambulatory Visit: Payer: Self-pay

## 2019-04-04 ENCOUNTER — Ambulatory Visit: Payer: Self-pay

## 2019-04-04 DIAGNOSIS — I10 Essential (primary) hypertension: Secondary | ICD-10-CM | POA: Diagnosis not present

## 2019-04-04 DIAGNOSIS — I2510 Atherosclerotic heart disease of native coronary artery without angina pectoris: Secondary | ICD-10-CM | POA: Insufficient documentation

## 2019-04-04 DIAGNOSIS — E042 Nontoxic multinodular goiter: Secondary | ICD-10-CM | POA: Diagnosis not present

## 2019-04-04 DIAGNOSIS — E039 Hypothyroidism, unspecified: Secondary | ICD-10-CM | POA: Insufficient documentation

## 2019-04-04 DIAGNOSIS — R001 Bradycardia, unspecified: Secondary | ICD-10-CM | POA: Insufficient documentation

## 2019-04-04 DIAGNOSIS — R4182 Altered mental status, unspecified: Secondary | ICD-10-CM | POA: Diagnosis not present

## 2019-04-04 DIAGNOSIS — I959 Hypotension, unspecified: Secondary | ICD-10-CM | POA: Diagnosis not present

## 2019-04-04 DIAGNOSIS — R531 Weakness: Secondary | ICD-10-CM | POA: Insufficient documentation

## 2019-04-04 DIAGNOSIS — Z87891 Personal history of nicotine dependence: Secondary | ICD-10-CM | POA: Diagnosis not present

## 2019-04-04 DIAGNOSIS — E876 Hypokalemia: Secondary | ICD-10-CM

## 2019-04-04 DIAGNOSIS — Z79899 Other long term (current) drug therapy: Secondary | ICD-10-CM | POA: Diagnosis not present

## 2019-04-04 DIAGNOSIS — R4020 Unspecified coma: Secondary | ICD-10-CM | POA: Diagnosis not present

## 2019-04-04 LAB — URINALYSIS, ROUTINE W REFLEX MICROSCOPIC
Bilirubin Urine: NEGATIVE
Glucose, UA: NEGATIVE mg/dL
Ketones, ur: NEGATIVE mg/dL
Nitrite: NEGATIVE
Protein, ur: NEGATIVE mg/dL
Specific Gravity, Urine: 1.012 (ref 1.005–1.030)
pH: 6 (ref 5.0–8.0)

## 2019-04-04 LAB — TROPONIN I: Troponin I: <0.03 ng/mL (ref <0.03)

## 2019-04-04 LAB — CBC
HCT: 37.9 % (ref 36.0–46.0)
Hemoglobin: 12.4 g/dL (ref 12.0–15.0)
MCH: 29.4 pg (ref 26.0–34.0)
MCHC: 32.7 g/dL (ref 30.0–36.0)
MCV: 89.8 fL (ref 80.0–100.0)
Platelets: 285 10*3/uL (ref 150–400)
RBC: 4.22 MIL/uL (ref 3.87–5.11)
RDW: 13.5 % (ref 11.5–15.5)
WBC: 8.4 10*3/uL (ref 4.0–10.5)
nRBC: 0 % (ref 0.0–0.2)

## 2019-04-04 LAB — BASIC METABOLIC PANEL
Anion gap: 10 (ref 5–15)
BUN: 20 mg/dL (ref 8–23)
CO2: 24 mmol/L (ref 22–32)
Calcium: 9.4 mg/dL (ref 8.9–10.3)
Chloride: 106 mmol/L (ref 98–111)
Creatinine, Ser: 0.86 mg/dL (ref 0.44–1.00)
GFR calc Af Amer: >60 mL/min (ref >60)
GFR calc non Af Amer: >60 mL/min (ref >60)
Glucose, Bld: 129 mg/dL — ABNORMAL HIGH (ref 70–99)
Potassium: 3.1 mmol/L — ABNORMAL LOW (ref 3.5–5.1)
Sodium: 140 mmol/L (ref 135–145)

## 2019-04-04 LAB — CBG MONITORING, ED: Glucose-Capillary: 92 mg/dL (ref 70–99)

## 2019-04-04 MED ORDER — SODIUM CHLORIDE 0.9% FLUSH
3.0000 mL | Freq: Once | INTRAVENOUS | Status: AC
  Administered 2019-04-04: 3 mL via INTRAVENOUS

## 2019-04-04 MED ORDER — POTASSIUM CHLORIDE CRYS ER 20 MEQ PO TBCR
40.0000 meq | EXTENDED_RELEASE_TABLET | Freq: Once | ORAL | Status: AC
  Administered 2019-04-04: 40 meq via ORAL
  Filled 2019-04-04: qty 2

## 2019-04-04 MED ORDER — SODIUM CHLORIDE 0.9 % IV BOLUS
1000.0000 mL | Freq: Once | INTRAVENOUS | Status: AC
  Administered 2019-04-04: 1000 mL via INTRAVENOUS

## 2019-04-04 NOTE — Telephone Encounter (Signed)
-----   Message from [name], DO sent at 04/03/2019  8:11 PM EDT ----- -follow up with Dr. Maudie Mercury in 3-4 weeks, Hypertension-call her pharmacy - they have been refilling her combo BP med los-hctz and a separate hctz even though it was [facility]! Make sure the only BP med the will be refilling is los-hctz. DC hctz and atenolol and any other BP meds they may have continued despite [place] stopping them.

## 2019-04-04 NOTE — Telephone Encounter (Signed)
Patient's sister called and says the patient is weaker than she was this morning. She says she just got home and her sister is nauseated and says she's weak. She checked her BP/P left arm 104/59, 54; right arm 114/56, 51. She asks should she take her back to the emergency room just so they can check and make sure her potassium hasn't dropped again. I advised is she's getting worse to take her to the ED, she verbalized understanding and says she will drive her there.   Reason for Disposition . Patient sounds very sick or weak to the triager  Protocols used: WEAKNESS (GENERALIZED) AND FATIGUE-A-AH

## 2019-04-04 NOTE — Telephone Encounter (Signed)
I called the pt and she stated she will await the visit on 7/8 with Dr Ethlyn Gallery.  I called [facility], spoke with [facility] the pharmacist and he stated he will inactivate the Rx for HCTZ. Patient complains of nausea and weakness today and I offered a phone visit with another provider as Dr Ethlyn Gallery did not have any openings today.  Patient stated she did not feel like doing this today and will call back if needed.  Message sent to Dr Ethlyn Gallery as Juluis Rainier.

## 2019-04-04 NOTE — ED Provider Notes (Signed)
[facility] EMERGENCY DEPARTMENT Provider Note   CSN: [id number] Arrival date & time: 04/04/19  1647    History   Chief Complaint Chief Complaint  Patient presents with  . Hypotension  . Weakness  . Altered Mental Status    HPI Felicia Kelly is a 83 y.o. female.     83yo female with complaint of generalized weakness since getting home from the hospital (admitted Mon-Wed this week for bradycardia), also reports fatigue (chronic). Patient has been advised to wean from her Atenolol starting today. Bp today 101/50 R, 110/50 L with heart rate of 49. BP 117/63 currently, feels ok at this time.      Past Medical History:  Diagnosis Date  . Aortic atherosclerosis ([place])    a. noted on CT 01/2018.  . Arthritis   . Colonic polyp   . Coronary atherosclerosis    a. noted on CT 01/2018.  Marland Kitchen Genital herpes   . Hyperlipidemia   . Hypertension   . Mild carotid artery disease ([place])   . Osteoporosis   . Pulmonary nodules    a. folloewd by PCP by serial Ct.  . Thyroid disease    seeing Dr. Buddy Duty  . Tremor     Patient Active Problem List   Diagnosis Date Noted  . Acute encephalopathy 04/02/2019  . Overdose of cardiac medication   . Bradycardia 04/01/2019  . Cystocele 02/13/2019  . Facial pain 02/13/2019  . Hip pain 02/13/2019  . Osteoarthritis of hip 02/13/2019  . Subchondral cysts 02/13/2019  . Vitamin D deficiency 02/13/2019  . Aortic atherosclerosis ([place]) 10/14/2017  . Solitary pulmonary nodule 10/23/2016  . Hyperglycemia 07/11/2016  . Hypercholesterolemia 06/04/2013  . Hypothyroidism 06/04/2013  . Genital herpes 06/04/2013  . Osteopenia 06/27/2012  . Malignant basal cell neoplasm of skin 02/14/2012  . Benign essential hypertension 11/22/2011  . Internal carotid artery stenosis 09/18/2011  . Thumb pain 09/13/2011  . Multinodular goiter 10/16/2009  . Herpes simplex type 2 infection 10/05/2008  . Single renal cyst 02/14/2008    Past Surgical  History:  Procedure Laterality Date  . ABDOMINAL HYSTERECTOMY  1988   no cancer - reports total with bilat oophorectomy  . APPENDECTOMY  1988  . BREAST CYST EXCISION Bilateral   . BREAST SURGERY  1985   biopsy  . FOOT SURGERY    . TOTAL HIP ARTHROPLASTY       OB History   No obstetric history on file.      Home Medications    Prior to Admission medications   Medication Sig Start Date End Date Taking? Authorizing Provider  donepezil (ARICEPT) 10 MG tablet Take 1/2 tablet daily for 2 weeks, then increase to 1 tablet daily Patient taking differently: Take 10 mg by mouth daily.  02/14/19   Cameron Sprang, MD  doxycycline (VIBRAMYCIN) 100 MG capsule Take 100 mg by mouth. As needed prior to dental procedures    [provider]  levothyroxine (SYNTHROID, LEVOTHROID) 50 MCG tablet Take 50 mcg by mouth daily before breakfast.    [provider]  losartan-hydrochlorothiazide (HYZAAR) 100-12.5 MG tablet Take 1 tablet by mouth daily. 03/14/19   Koberlein, Steele Berg, MD  pravastatin (PRAVACHOL) 80 MG tablet TAKE 1 TABLET (80 MG TOTAL) BY MOUTH DAILY. 12/10/18   [name], DO  valACYclovir (VALTREX) 500 MG tablet TAKE 1 TABLET (500 MG TOTAL) BY MOUTH DAILY. Patient taking differently: Take 500 mg by mouth daily.  11/19/18   [name],  DO    Family History Family History  Problem Relation Age of Onset  . Cancer Father        pancreatic  . Pancreatic cancer Father   . Heart disease Father   . Heart attack Sister 49  . Arthritis Mother   . Hyperlipidemia Mother   . Hypertension Mother   . Heart disease Mother   . Diabetes Mother   . Liver disease Brother   . Breast cancer Neg Hx     Social History Social History   Tobacco Use  . Smoking status: Former Smoker    Quit date: 10/16/1978    Years since quitting: 40.4  . Smokeless tobacco: Never Used  . Tobacco comment: 1963 when her father died;  smoked for a brief period of time  Substance Use Topics  . Alcohol  use: No    Alcohol/week: 0.0 standard drinks  . Drug use: No     Allergies   Penicillins, Tetracycline, Atorvastatin, Bactrim [sulfamethoxazole-trimethoprim], Celecoxib, Niacin, Niaspan [niacin er], Rosuvastatin, Simvastatin, and Tetracyclines & related   Review of Systems Review of Systems  Constitutional: Positive for appetite change and fatigue.  Respiratory: Negative for shortness of breath.   Cardiovascular: Negative for chest pain, palpitations and leg swelling.  Gastrointestinal: Negative for abdominal pain, nausea and vomiting.  Genitourinary: Negative for dysuria, frequency and urgency.  Musculoskeletal: Negative for arthralgias and myalgias.  Skin: Negative for rash and wound.  Neurological: Positive for dizziness and weakness.  All other systems reviewed and are negative.    Physical Exam Updated Vital Signs BP (!) 127/44 (BP Location: Right Arm)   Pulse (!) 49   Temp 98.2 F (36.8 C) (Oral)   Resp 14   SpO2 100%   Physical Exam Vitals signs and nursing note reviewed.  Constitutional:      General: She is not in acute distress.    Appearance: She is well-developed. She is not diaphoretic.  HENT:     Head: Normocephalic and atraumatic.     Mouth/Throat:     Mouth: Mucous membranes are moist.  Eyes:     Extraocular Movements: Extraocular movements intact.     Pupils: Pupils are equal, round, and reactive to light.  Neck:     Musculoskeletal: Neck supple.  Cardiovascular:     Rate and Rhythm: Normal rate and regular rhythm.     Pulses: Normal pulses.     Heart sounds: Normal heart sounds.  Pulmonary:     Effort: Pulmonary effort is normal.     Breath sounds: Normal breath sounds.  Abdominal:     Palpations: Abdomen is soft.     Tenderness: There is no abdominal tenderness.  Musculoskeletal:     Right lower leg: No edema.     Left lower leg: No edema.  Skin:    General: Skin is warm and dry.  Neurological:     General: No focal deficit present.      Mental Status: She is alert and oriented to person, place, and time. Mental status is at baseline.     Cranial Nerves: No cranial nerve deficit.     Sensory: No sensory deficit.     Motor: No weakness or pronator drift.     Coordination: Romberg sign negative. Rapid alternating movements normal.  Psychiatric:        Behavior: Behavior normal.      ED Treatments / Results  Labs (all labs ordered are listed, but only abnormal results are displayed) Labs Reviewed  BASIC METABOLIC PANEL - Abnormal; Notable for the following components:      Result Value   Potassium 3.1 (*)    Glucose, Bld 129 (*)    All other components within normal limits  CBC  TROPONIN I  URINALYSIS, ROUTINE W REFLEX MICROSCOPIC  CBG MONITORING, ED    EKG EKG Interpretation  Date/Time:  Friday April 04 2019 17:20:45 EDT Ventricular Rate:  53 PR Interval:  150 QRS Duration: 82 QT Interval:  462 QTC Calculation: 433 R Axis:   74 Text Interpretation:  Sinus bradycardia Otherwise normal ECG No significant change since last tracing Confirmed by Wandra Arthurs [phone]) on 04/04/2019 5:26:15 PM   Radiology Ct Head Wo Contrast  Result Date: 04/04/2019 CLINICAL DATA:  Altered level of consciousness. EXAM: CT HEAD WITHOUT CONTRAST TECHNIQUE: Contiguous axial images were obtained from the base of the skull through the vertex without intravenous contrast. COMPARISON:  04/01/2019 FINDINGS: Brain: No evidence of acute infarction, hemorrhage, hydrocephalus, extra-axial collection or mass lesion/mass effect. Moderate brain parenchymal volume loss and deep white matter microangiopathy. Vascular: Mild calcific atherosclerotic disease at the skull base. Skull: Normal. Negative for fracture or focal lesion. Sinuses/Orbits: No acute finding. Other: None. IMPRESSION: 1. No acute intracranial abnormality. 2. Moderate brain parenchymal atrophy and chronic microvascular disease. Electronically Signed   By: Fidela Salisbury M.D.   On:  04/04/2019 19:20   Dg Chest Port 1 View  Result Date: 04/04/2019 CLINICAL DATA:  Altered mental status. Weakness today. EXAM: PORTABLE CHEST 1 VIEW COMPARISON:  Radiographs 3 days ago 04/01/2019 FINDINGS: Improved lung volumes from prior exam.The cardiomediastinal contours are normal. The lungs are clear. Pulmonary vasculature is normal. No consolidation, pleural effusion, or pneumothorax. No acute osseous abnormalities are seen. IMPRESSION: No acute chest findings. Electronically Signed   By: Keith Rake M.D.   On: 04/04/2019 19:04    Procedures Procedures (including critical care time)  Medications Ordered in ED Medications  sodium chloride flush (NS) 0.9 % injection 3 mL (3 mLs Intravenous Given 04/04/19 2004)  sodium chloride 0.9 % bolus 1,000 mL (1,000 mLs Intravenous New Bag/Given 04/04/19 2001)  potassium chloride SA (K-DUR) CR tablet 40 mEq (40 mEq Oral Given 04/04/19 1950)     Initial Impression / Assessment and Plan / ED Course  I have reviewed the triage vital signs and the nursing notes.  Pertinent labs & imaging results that were available during my care of the patient were reviewed by me and considered in my medical decision making (see chart for details).  Clinical Course as of Apr 04 2051  Fri Apr 04, 2019  1935 Orthostatic VS Lying- 113/53 hr 52 Sitting- 105/48 hr 57 Standing- 95/55 hr 42   [LM]  3369 83 year old female brought in by her sister for low blood pressure.  Patient was recently admitted to the hospital for bradycardia after possibly taking extra blood pressure medication.  She has felt generally weak and dizzy/unwell since starting Aricept on May 1 and wonders if this is causing her symptoms.  Patient had a virtual visit with her PCP after she was discharged from the hospital 2 days ago, was advised to cut her atenolol in half with plan to taper off of this medication.  Patient did take her half of an atenolol today.  CT head ordered in triage is negative  for any acute findings.  CBC is within normal limits, troponin is negative, BMP with mild hypokalemia with a potassium of 3.1 which was orally repleted.  Orthostatic  vital signs do not show patient to be orthostatic.  Case discussed with Dr. Bayard Hugger, ER attending, suspect her symptoms of bradycardia and low blood pressure may be related to her atenolol and recommend that she hold this medication tomorrow morning and await consult with her PCP tomorrow to discuss discontinuing this medication.  Patient to be given IV fluids, ambulate patient and if she ambulates with baseline gait she may be discharged back to the care of her sister.   [LM]    Clinical Course User Index [LM] Tacy Learn, PA-C      Final Clinical Impressions(s) / ED Diagnoses   Final diagnoses:  Weakness  Hypokalemia  Bradycardia    ED Discharge Orders    None       Roque Lias 04/04/19 2052    Drenda Freeze, MD 04/06/19 2037

## 2019-04-04 NOTE — Telephone Encounter (Signed)
Noted  

## 2019-04-04 NOTE — ED Notes (Signed)
Discharge instructions and betablocker DC discussed with pt. And pt's sister at bedside. Pt/sister verbalized understanding.

## 2019-04-04 NOTE — ED Triage Notes (Addendum)
Pt here for evaluation of low BP, states her BP at home was "something over 53" Pt having generalized fatigue, was recently d.c from here last week. Pt alert. Pt asking repetitive questions, seems altered.

## 2019-04-04 NOTE — ED Notes (Signed)
Pt. Ambulate in hall with assistance from NT. Pt. Stable denies dizziness. BP 133/78

## 2019-04-04 NOTE — ED Notes (Signed)
CBG- 92 

## 2019-04-04 NOTE — Discharge Instructions (Signed)
Recheck with your doctor tomorrow, hold your Atenolol tomorrow until you talk to your doctor about possibly stopping this medicine. Return to Er for new or worsening symptoms.

## 2019-04-04 NOTE — ED Notes (Signed)
Pt. To finish bolus, VS, and ambulate

## 2019-04-05 ENCOUNTER — Telehealth: Payer: Self-pay | Admitting: *Deleted

## 2019-04-05 NOTE — Telephone Encounter (Signed)
Patient called after hours line today. Patient verbalized Caller states she has been in the hospital since Tuesday. She was given potassium pill in the Er and plan to get the potassium pill and has stopped the atenolol per Dr orders. Caller denies any symptoms at this time. Encouraged to call on Monday for FU appt when the office is open. Patient went to ED on 6/19.  Please schedule a f/u appointment with Dr. Maudie Mercury.

## 2019-04-05 NOTE — Telephone Encounter (Signed)
Patient did go to ED.  

## 2019-04-07 ENCOUNTER — Telehealth: Payer: Self-pay | Admitting: Neurology

## 2019-04-07 NOTE — Telephone Encounter (Signed)
I called the pt and scheduled a phone visit for 6/23 at 12:20pm.

## 2019-04-07 NOTE — Telephone Encounter (Signed)
Pt sister has some questions about her medication please call   Left message with after hours answering service on 04-07-19 @ 12:38

## 2019-04-07 NOTE — Telephone Encounter (Signed)
Spoke with pt sister stated Aricept 10mg  daily they see no improvement asking if medication needs to be increased or changed

## 2019-04-07 NOTE — Telephone Encounter (Signed)
Pt called and advised  that there is no medication that will help her remember things more. I had discussed with them that the Aricept and all the medications that are FDA approved for memory are more of stabilizing agents to help slow down the worsening of memory, but it does not stop or reverse the worsening. It is not that she takes the medication and starts remembering things, it does not work that way. This is the highest dose of the Aricept.. Pt sister verbalized understanding, information packet was mailed out as well

## 2019-04-07 NOTE — Telephone Encounter (Signed)
Pls let sister know that there is no medication that will help her remember things more. I had discussed with them that the Aricept and all the medications that are FDA approved for memory are more of stabilizing agents to help slow down the worsening of memory, but it does not stop or reverse the worsening. It is not that she takes the medication and starts remembering things, it does not work that way. This is the highest dose of the Aricept.

## 2019-04-08 ENCOUNTER — Other Ambulatory Visit: Payer: Self-pay

## 2019-04-08 ENCOUNTER — Ambulatory Visit: Payer: Medicare Other | Admitting: Family Medicine

## 2019-04-08 DIAGNOSIS — E042 Nontoxic multinodular goiter: Secondary | ICD-10-CM | POA: Diagnosis not present

## 2019-04-08 DIAGNOSIS — E0510 Thyrotoxicosis with toxic single thyroid nodule without thyrotoxic crisis or storm: Secondary | ICD-10-CM | POA: Diagnosis not present

## 2019-04-08 DIAGNOSIS — E890 Postprocedural hypothyroidism: Secondary | ICD-10-CM | POA: Diagnosis not present

## 2019-04-08 NOTE — Progress Notes (Signed)
  Unable to reach patient. She was rescheduled to another day by staff.

## 2019-04-10 ENCOUNTER — Other Ambulatory Visit: Payer: Self-pay

## 2019-04-10 ENCOUNTER — Ambulatory Visit (INDEPENDENT_AMBULATORY_CARE_PROVIDER_SITE_OTHER): Payer: Medicare Other | Admitting: Family Medicine

## 2019-04-10 VITALS — BP 134/72 | HR 62

## 2019-04-10 DIAGNOSIS — E876 Hypokalemia: Secondary | ICD-10-CM | POA: Diagnosis not present

## 2019-04-10 DIAGNOSIS — I952 Hypotension due to drugs: Secondary | ICD-10-CM

## 2019-04-10 DIAGNOSIS — R001 Bradycardia, unspecified: Secondary | ICD-10-CM | POA: Diagnosis not present

## 2019-04-10 NOTE — Progress Notes (Signed)
Virtual Visit via Video Note  I connected with Felicia Kelly  on 04/10/19 at  5:40 PM EDT by a video enabled telemedicine application and verified that I am speaking with the correct person using two identifiers.  Location patient: home Location provider:work or home office Persons participating in the virtual visit: patient, provider  I discussed the limitations of evaluation and management by telemedicine and the availability of in person appointments. The patient expressed understanding and agreed to proceed.   HPI:  Hospitalized 6/16 for weakness and confusion - thought secondary to polypharmacy. Found out from sister she may have been taking more of her atenolol then she was supposed to prior to the hospitalization. Was titrating off atenolol with plans for close follow up as BP was a little high and HR was ok when we saw her 8/18. The day she started titrating off, 6/19, she went to ER for "weakness." HR was 49-50s, BP 101/50 per ER notes and they felt she needed to discontinue the atenolol. She reports today she is feeling "much much better" to day. Reports she few so much better particularly. BP 142/77, HR 55. 134/72. P 62.Denies weakness, palpitations, SOB, DOE, CP, HA or any other symptoms today. Sister reports has been staying with family members is doing well today. They have completely stopped the atenolol since the ER visit.   ROS: See pertinent positives and negatives per HPI.  Past Medical History:  Diagnosis Date  . Aortic atherosclerosis ([place])    a. noted on CT 01/2018.  . Arthritis   . Colonic polyp   . Coronary atherosclerosis    a. noted on CT 01/2018.  Marland Kitchen Genital herpes   . Hyperlipidemia   . Hypertension   . Mild carotid artery disease ([place])   . Osteoporosis   . Pulmonary nodules    a. folloewd by PCP by serial Ct.  . Thyroid disease    seeing Dr. Buddy Duty  . Tremor     Past Surgical History:  Procedure Laterality Date  . ABDOMINAL HYSTERECTOMY  1988   no cancer -  reports total with bilat oophorectomy  . APPENDECTOMY  1988  . BREAST CYST EXCISION Bilateral   . BREAST SURGERY  1985   biopsy  . FOOT SURGERY    . TOTAL HIP ARTHROPLASTY      Family History  Problem Relation Age of Onset  . Cancer Father        pancreatic  . Pancreatic cancer Father   . Heart disease Father   . Heart attack Sister 5  . Arthritis Mother   . Hyperlipidemia Mother   . Hypertension Mother   . Heart disease Mother   . Diabetes Mother   . Liver disease Brother   . Breast cancer Neg Hx     SOCIAL HX: see hpi   Current Outpatient Medications:  .  donepezil (ARICEPT) 10 MG tablet, Take 1/2 tablet daily for 2 weeks, then increase to 1 tablet daily (Patient taking differently: Take 10 mg by mouth daily. ), Disp: 30 tablet, Rfl: 11 .  doxycycline (VIBRAMYCIN) 100 MG capsule, Take 100 mg by mouth. As needed prior to dental procedures, Disp: , Rfl:  .  levothyroxine (SYNTHROID, LEVOTHROID) 50 MCG tablet, Take 50 mcg by mouth daily before breakfast., Disp: , Rfl:  .  losartan-hydrochlorothiazide (HYZAAR) 100-12.5 MG tablet, Take 1 tablet by mouth daily., Disp: 90 tablet, Rfl: 1 .  pravastatin (PRAVACHOL) 80 MG tablet, TAKE 1 TABLET (80 MG TOTAL) BY MOUTH DAILY., Disp:  90 tablet, Rfl: 1 .  valACYclovir (VALTREX) 500 MG tablet, TAKE 1 TABLET (500 MG TOTAL) BY MOUTH DAILY. (Patient taking differently: Take 500 mg by mouth daily. ), Disp: 90 tablet, Rfl: 1  EXAM:  VITALS per patient if applicable: Today's Vitals   04/10/19 1814  BP: 134/72  Pulse: 62   There is no height or weight on file to calculate BMI. GENERAL: alert, oriented, appears well and in no acute distress  HEENT: atraumatic, conjunttiva clear, no obvious abnormalities on inspection of external nose and ears  NECK: normal movements of the head and neck  LUNGS: on inspection no signs of respiratory distress, breathing rate appears normal, no obvious gross SOB, gasping or wheezing  CV: no obvious  cyanosis  MS: moves all visible extremities without noticeable abnormality  PSYCH/NEURO: pleasant and cooperative, no obvious depression or anxiety, speech and thought processing grossly intact  ASSESSMENT AND PLAN:  Discussed the following assessment and plan:  Hypotension due to drugs   Bradycardia   Hypokalemia   -doing much better today -discussed risks of stopping atenolol suddenly, but at this point it seems benefits outway risks and this is what they would like to do -They agree to monitor the blood pressure and HR at home closely -recheck cbc in 1-2 weeks, in the meantime advised healthy diet with potassium rich food daily -follow up virtual visit in 1 month   I discussed the assessment and treatment plan with the patient. The patient was provided an opportunity to ask questions and all were answered. The patient agreed with the plan and demonstrated an understanding of the instructions.   The patient was advised to call back or seek an in-person evaluation if the symptoms worsen or if the condition fails to improve as anticipated.   Follow up instructions: Advised assistant Wendie Simmer to help patient arrange the following: -lab visit for BMP in 1-2 weeks -follow up with Dr. Lolita Cram in  4 months, keep visit with Dr. Ethlyn Gallery next month also  [name], DO

## 2019-04-11 ENCOUNTER — Telehealth: Payer: Self-pay | Admitting: *Deleted

## 2019-04-11 NOTE — Telephone Encounter (Signed)
I called Mrs Felicia Kelly and informed her the pt has an appt with Dr Ethlyn Gallery on 7/8, which is in 2 weeks and the labs can be done at that time.  Mrs Felicia Kelly stated she will schedule the follow up phone visit at a later date.

## 2019-04-11 NOTE — Telephone Encounter (Signed)
-----   Message from [name], DO sent at 04/10/2019  6:29 PM EDT ----- -lab visit for BMP in 1-2 weeks-follow up with Dr. Lolita Cram in  4 months, keep visit with Dr. Ethlyn Gallery next month also

## 2019-04-17 ENCOUNTER — Other Ambulatory Visit: Payer: Self-pay

## 2019-04-17 ENCOUNTER — Ambulatory Visit (INDEPENDENT_AMBULATORY_CARE_PROVIDER_SITE_OTHER): Payer: Medicare Other | Admitting: Family Medicine

## 2019-04-17 DIAGNOSIS — Z20828 Contact with and (suspected) exposure to other viral communicable diseases: Secondary | ICD-10-CM | POA: Diagnosis not present

## 2019-04-17 DIAGNOSIS — Z20822 Contact with and (suspected) exposure to covid-19: Secondary | ICD-10-CM

## 2019-04-17 NOTE — Patient Instructions (Signed)
I have asked my assistant to order Coronavirus (COVID19) testing for you. Please call our office if you have any concerns or questions or this testing has not been arranged in the next 24-48 hours.   Self Isolate: -see the CDC site for information:   RunningShows.co.za.html   -stay home except for to seek medical care -stay in your own room away from others in your house, wash hands frequently, wear a mask if you leave your room and interacted as little as possible with others -seek medical care if worsening - call out office for a visit or call ahead if going elsewhere -seek emergency care if very sick or severe symptoms - call 911 -isolate for at least 10 days from the onset of symptoms PLUS 3 days of no fever PLUS 3 days of improving symptoms, unless instructed otherwise by a doctor.   Follow up in 2 days. Sooner as needed.

## 2019-04-17 NOTE — Progress Notes (Signed)
Virtual Visit via Video Note  I connected with Felicia Kelly  on 04/17/19 at  3:00 PM EDT by a video enabled telemedicine application and verified that I am speaking with the correct person using two identifiers.  Location patient: home Location provider:work or home office Persons participating in the virtual visit: patient, provider  I discussed the limitations of evaluation and management by telemedicine and the availability of in person appointments. The patient expressed understanding and agreed to proceed.   HPI:  Caregiver's husband whom patient is staying with has tested positive for COVID19. He started getting sick 3 days ago. He tested positive for COVID19. Was around others at an outdoor funeral at church. Multiple church members were infected. Patient and sister want covid testing. Neither is having any symptoms. Denies fever. They are staying away from the sick individual, isolating, sanitizing, etc.   ROS: See pertinent positives and negatives per HPI.  Past Medical History:  Diagnosis Date  . Aortic atherosclerosis ([place])    a. noted on CT 01/2018.  . Arthritis   . Colonic polyp   . Coronary atherosclerosis    a. noted on CT 01/2018.  Marland Kitchen Genital herpes   . Hyperlipidemia   . Hypertension   . Mild carotid artery disease ([place])   . Osteoporosis   . Pulmonary nodules    a. folloewd by PCP by serial Ct.  . Thyroid disease    seeing Dr. Buddy Duty  . Tremor     Past Surgical History:  Procedure Laterality Date  . ABDOMINAL HYSTERECTOMY  1988   no cancer - reports total with bilat oophorectomy  . APPENDECTOMY  1988  . BREAST CYST EXCISION Bilateral   . BREAST SURGERY  1985   biopsy  . FOOT SURGERY    . TOTAL HIP ARTHROPLASTY      Family History  Problem Relation Age of Onset  . Cancer Father        pancreatic  . Pancreatic cancer Father   . Heart disease Father   . Heart attack Sister 54  . Arthritis Mother   . Hyperlipidemia Mother   . Hypertension Mother   .  Heart disease Mother   . Diabetes Mother   . Liver disease Brother   . Breast cancer Neg Hx     SOCIAL HX:see hpi   Current Outpatient Medications:  .  donepezil (ARICEPT) 10 MG tablet, Take 1/2 tablet daily for 2 weeks, then increase to 1 tablet daily (Patient taking differently: Take 10 mg by mouth daily. ), Disp: 30 tablet, Rfl: 11 .  doxycycline (VIBRAMYCIN) 100 MG capsule, Take 100 mg by mouth. As needed prior to dental procedures, Disp: , Rfl:  .  levothyroxine (SYNTHROID, LEVOTHROID) 50 MCG tablet, Take 50 mcg by mouth daily before breakfast., Disp: , Rfl:  .  losartan-hydrochlorothiazide (HYZAAR) 100-12.5 MG tablet, Take 1 tablet by mouth daily., Disp: 90 tablet, Rfl: 1 .  pravastatin (PRAVACHOL) 80 MG tablet, TAKE 1 TABLET (80 MG TOTAL) BY MOUTH DAILY., Disp: 90 tablet, Rfl: 1 .  valACYclovir (VALTREX) 500 MG tablet, TAKE 1 TABLET (500 MG TOTAL) BY MOUTH DAILY. (Patient taking differently: Take 500 mg by mouth daily. ), Disp: 90 tablet, Rfl: 1  EXAM:  VITALS per patient if applicable:denies fever  GENERAL: alert, oriented, appears well and in no acute distress  HEENT: atraumatic, conjunttiva clear, no obvious abnormalities on inspection of external nose and ears  NECK: normal movements of the head and neck  LUNGS: on inspection no signs  of respiratory distress, breathing rate appears normal, no obvious gross SOB, gasping or wheezing  CV: no obvious cyanosis  MS: moves all visible extremities without noticeable abnormality  PSYCH/NEURO: pleasant and cooperative, no obvious depression or anxiety, speech and thought processing grossly intact  ASSESSMENT AND PLAN:  Discussed the following assessment and plan:  Close Exposure to 2019 Novel Coronavirus -  -asked Felicia Kelly to place orders for referral to [facility] for testing for patient and Sister whom is our patient as well. -advised 14 day quarantine, home isolation -discussed signs and symptoms of COVID19 -precautions  discussed -advised to call ahead if they need medical care -advised to follow up if symptoms or positive test -discussed lmitations of test    I discussed the assessment and treatment plan with the patient. The patient was provided an opportunity to ask questions and all were answered. The patient agreed with the plan and demonstrated an understanding of the instructions.   The patient was advised to call back or seek an in-person evaluation if the symptoms worsen or if the condition fails to improve as anticipated.   [name], DO

## 2019-04-18 ENCOUNTER — Telehealth: Payer: Self-pay | Admitting: Family Medicine

## 2019-04-18 ENCOUNTER — Telehealth: Payer: Self-pay | Admitting: *Deleted

## 2019-04-18 DIAGNOSIS — Z20822 Contact with and (suspected) exposure to covid-19: Secondary | ICD-10-CM

## 2019-04-18 NOTE — Addendum Note (Signed)
Addended by: Dimple Nanas on: 04/18/2019 12:47 PM   Modules accepted: Orders

## 2019-04-18 NOTE — Telephone Encounter (Signed)
Sister Felicia Kelly calling back to schedule her and the pt covid test.  Felicia Kelly order is in, however the pt does not have an order.  Pt connected with Dr Maudie Mercury  7/02, and pt has also been exposed. Sister is going to wait for dr Maudie Mercury to put the covid order for the pt so they can schedule and go together. Please call when the order is in.

## 2019-04-18 NOTE — Telephone Encounter (Signed)
Patient scheduled for 04/21/2019 at 12:30pm

## 2019-04-18 NOTE — Telephone Encounter (Addendum)
Attempted to schedule COVID testing; left message on voicemail. ----- Message from [place] sent at 04/17/2019  3:38 PM EDT ----- Regarding: Please order COVID testing per Dr. Colin Benton - Thanks!

## 2019-04-19 ENCOUNTER — Telehealth: Payer: Self-pay

## 2019-04-19 NOTE — Telephone Encounter (Signed)
-----   Message from [name], DO sent at 04/19/2019 12:29 PM EDT ----- Patient needs COVID19 testing, close household exposure to confirmed case. Staff was supposed to order Thursday, but received message just now that staff did not order. Please order and contact pt asap. Thanks!

## 2019-04-19 NOTE — Telephone Encounter (Signed)
Called pt and LM on VM to call back for Covid testing.

## 2019-04-21 ENCOUNTER — Other Ambulatory Visit: Payer: Self-pay

## 2019-04-21 DIAGNOSIS — Z20822 Contact with and (suspected) exposure to covid-19: Secondary | ICD-10-CM

## 2019-04-21 NOTE — Telephone Encounter (Signed)
See phone note from nurse in which appt is scheduled for today-7/6.

## 2019-04-23 ENCOUNTER — Encounter: Payer: Medicare Other | Admitting: Family Medicine

## 2019-04-24 ENCOUNTER — Other Ambulatory Visit: Payer: Medicare Other

## 2019-04-26 LAB — NOVEL CORONAVIRUS, NAA: SARS-CoV-2, NAA: NOT DETECTED

## 2019-04-28 ENCOUNTER — Telehealth: Payer: Self-pay | Admitting: Family Medicine

## 2019-04-28 NOTE — Telephone Encounter (Signed)
Pt received negative covid test results

## 2019-05-05 ENCOUNTER — Telehealth: Payer: Self-pay | Admitting: Neurology

## 2019-05-05 NOTE — Telephone Encounter (Signed)
Sibling left msg with after hours about patient taking memory medication that was started on 5/1. She is very weak and tired always, The medication isnt helping. On call nurse told her to follow up with PCP in 3 days. Thanks!

## 2019-05-06 ENCOUNTER — Encounter (HOSPITAL_COMMUNITY): Payer: Self-pay | Admitting: *Deleted

## 2019-05-06 ENCOUNTER — Ambulatory Visit: Payer: Medicare Other | Admitting: Family Medicine

## 2019-05-06 ENCOUNTER — Emergency Department (HOSPITAL_COMMUNITY)
Admission: EM | Admit: 2019-05-06 | Discharge: 2019-05-06 | Disposition: A | Discharge status: Home / Self Care | Payer: Medicare Other | Attending: Emergency Medicine | Admitting: Emergency Medicine

## 2019-05-06 ENCOUNTER — Emergency Department (HOSPITAL_COMMUNITY): Payer: Medicare Other

## 2019-05-06 ENCOUNTER — Other Ambulatory Visit: Payer: Self-pay

## 2019-05-06 DIAGNOSIS — W01198A Fall on same level from slipping, tripping and stumbling with subsequent striking against other object, initial encounter: Secondary | ICD-10-CM | POA: Insufficient documentation

## 2019-05-06 DIAGNOSIS — Y998 Other external cause status: Secondary | ICD-10-CM | POA: Diagnosis not present

## 2019-05-06 DIAGNOSIS — Z23 Encounter for immunization: Secondary | ICD-10-CM | POA: Insufficient documentation

## 2019-05-06 DIAGNOSIS — S0101XA Laceration without foreign body of scalp, initial encounter: Secondary | ICD-10-CM | POA: Diagnosis not present

## 2019-05-06 DIAGNOSIS — Z7982 Long term (current) use of aspirin: Secondary | ICD-10-CM | POA: Insufficient documentation

## 2019-05-06 DIAGNOSIS — Y92009 Unspecified place in unspecified non-institutional (private) residence as the place of occurrence of the external cause: Secondary | ICD-10-CM | POA: Diagnosis not present

## 2019-05-06 DIAGNOSIS — S0990XA Unspecified injury of head, initial encounter: Secondary | ICD-10-CM | POA: Insufficient documentation

## 2019-05-06 DIAGNOSIS — I10 Essential (primary) hypertension: Secondary | ICD-10-CM | POA: Diagnosis not present

## 2019-05-06 DIAGNOSIS — W19XXXA Unspecified fall, initial encounter: Secondary | ICD-10-CM

## 2019-05-06 DIAGNOSIS — Z87891 Personal history of nicotine dependence: Secondary | ICD-10-CM | POA: Diagnosis not present

## 2019-05-06 DIAGNOSIS — Y9301 Activity, walking, marching and hiking: Secondary | ICD-10-CM | POA: Diagnosis not present

## 2019-05-06 DIAGNOSIS — M542 Cervicalgia: Secondary | ICD-10-CM | POA: Diagnosis not present

## 2019-05-06 MED ORDER — LIDOCAINE-EPINEPHRINE (PF) 2 %-1:200000 IJ SOLN
20.0000 mL | Freq: Once | INTRAMUSCULAR | Status: AC
  Administered 2019-05-06: 20 mL
  Filled 2019-05-06: qty 20

## 2019-05-06 MED ORDER — POVIDONE-IODINE 10 % EX SOLN
CUTANEOUS | Status: DC | PRN
  Administered 2019-05-06: 07:00:00 via TOPICAL
  Filled 2019-05-06: qty 118

## 2019-05-06 MED ORDER — ACETAMINOPHEN 325 MG PO TABS
650.0000 mg | ORAL_TABLET | Freq: Once | ORAL | Status: AC
  Administered 2019-05-06: 650 mg via ORAL
  Filled 2019-05-06: qty 2

## 2019-05-06 MED ORDER — TETANUS-DIPHTH-ACELL PERTUSSIS 5-2.5-18.5 LF-MCG/0.5 IM SUSP
0.5000 mL | Freq: Once | INTRAMUSCULAR | Status: AC
  Administered 2019-05-06: 0.5 mL via INTRAMUSCULAR
  Filled 2019-05-06: qty 0.5

## 2019-05-06 NOTE — Discharge Instructions (Addendum)
Your CT scans are normal.  Follow-up with your doctor for staple removal in 1 week.  Return to the ED with worsening headache, unilateral weakness, difficulty speaking, difficulty swallowing, any other concerns.

## 2019-05-06 NOTE — ED Notes (Signed)
Pt ambulated self efficiently in hallway with no difficulty. Pt complains of no dizziness. Pt returned safely to bedside.

## 2019-05-06 NOTE — Telephone Encounter (Signed)
Manus Gunning (patient's sister) called concerned with patient's gradually increasing fatigue and weakness.  She wanted to ask if it was an expected side effect of the med or of her condition. Also she stated the patient sometimes mentions feeling "numbness on the top of her head". Any recommendations?

## 2019-05-06 NOTE — Telephone Encounter (Signed)
Left message for patient's sister Manus Gunning to return call.

## 2019-05-06 NOTE — ED Provider Notes (Signed)
[facility] EMERGENCY DEPARTMENT Provider Note   CSN: [id number] Arrival date & time: 05/06/19  0353     History   Chief Complaint Chief Complaint  Patient presents with  . Fall    HPI Felicia Kelly is a 83 y.o. female.     Patient here with head injury.  States she got up to use the bathroom and believes she tripped over her stool while she was trying to look out a window.  She was not using her cane and she is supposed to.  She hit her head on the top of a dresser and sustained a laceration to her the top of her scalp.  There was no loss of consciousness.  There is no preceding dizziness or lightheadedness.  She takes aspirin but no other anticoagulation.  She denies any neck or back pain.  She denies any focal weakness, numbness or tingling.  Denies any chest pain or shortness of breath.  No abdominal pain, nausea or vomiting.  States she does not hurting her else beside her head.  There is been no dizziness or lightheadedness prior to this.  Unknown last tetanus.  The history is provided by the patient.  Fall Associated symptoms include headaches. Pertinent negatives include no chest pain, no abdominal pain and no shortness of breath.    Past Medical History:  Diagnosis Date  . Aortic atherosclerosis ([place])    a. noted on CT 01/2018.  . Arthritis   . Colonic polyp   . Coronary atherosclerosis    a. noted on CT 01/2018.  Marland Kitchen Genital herpes   . Hyperlipidemia   . Hypertension   . Mild carotid artery disease ([place])   . Osteoporosis   . Pulmonary nodules    a. folloewd by PCP by serial Ct.  . Thyroid disease    seeing Dr. Buddy Duty  . Tremor     Patient Active Problem List   Diagnosis Date Noted  . Acute encephalopathy 04/02/2019  . Overdose of cardiac medication   . Bradycardia 04/01/2019  . Cystocele 02/13/2019  . Facial pain 02/13/2019  . Hip pain 02/13/2019  . Osteoarthritis of hip 02/13/2019  . Subchondral cysts 02/13/2019  . Vitamin D  deficiency 02/13/2019  . Aortic atherosclerosis ([place]) 10/14/2017  . Solitary pulmonary nodule 10/23/2016  . Hyperglycemia 07/11/2016  . Hypercholesterolemia 06/04/2013  . Hypothyroidism 06/04/2013  . Genital herpes 06/04/2013  . Osteopenia 06/27/2012  . Malignant basal cell neoplasm of skin 02/14/2012  . Benign essential hypertension 11/22/2011  . Internal carotid artery stenosis 09/18/2011  . Thumb pain 09/13/2011  . Multinodular goiter 10/16/2009  . Herpes simplex type 2 infection 10/05/2008  . Single renal cyst 02/14/2008    Past Surgical History:  Procedure Laterality Date  . ABDOMINAL HYSTERECTOMY  1988   no cancer - reports total with bilat oophorectomy  . APPENDECTOMY  1988  . BREAST CYST EXCISION Bilateral   . BREAST SURGERY  1985   biopsy  . FOOT SURGERY    . TOTAL HIP ARTHROPLASTY       OB History   No obstetric history on file.      Home Medications    Prior to Admission medications   Medication Sig Start Date End Date Taking? Authorizing Provider  donepezil (ARICEPT) 10 MG tablet Take 1/2 tablet daily for 2 weeks, then increase to 1 tablet daily Patient taking differently: Take 10 mg by mouth daily.  02/14/19   Cameron Sprang, MD  doxycycline (VIBRAMYCIN) 100  MG capsule Take 100 mg by mouth. As needed prior to dental procedures    [provider]  levothyroxine (SYNTHROID, LEVOTHROID) 50 MCG tablet Take 50 mcg by mouth daily before breakfast.    [provider]  losartan-hydrochlorothiazide (HYZAAR) 100-12.5 MG tablet Take 1 tablet by mouth daily. 03/14/19   Koberlein, Steele Berg, MD  pravastatin (PRAVACHOL) 80 MG tablet TAKE 1 TABLET (80 MG TOTAL) BY MOUTH DAILY. 12/10/18   [name], DO  valACYclovir (VALTREX) 500 MG tablet TAKE 1 TABLET (500 MG TOTAL) BY MOUTH DAILY. Patient taking differently: Take 500 mg by mouth daily.  11/19/18   [name], DO    Family History Family History  Problem Relation Age of Onset  . Cancer Father         pancreatic  . Pancreatic cancer Father   . Heart disease Father   . Heart attack Sister 59  . Arthritis Mother   . Hyperlipidemia Mother   . Hypertension Mother   . Heart disease Mother   . Diabetes Mother   . Liver disease Brother   . Breast cancer Neg Hx     Social History Social History   Tobacco Use  . Smoking status: Former Smoker    Quit date: 10/16/1978    Years since quitting: 40.5  . Smokeless tobacco: Never Used  . Tobacco comment: 1963 when her father died;  smoked for a brief period of time  Substance Use Topics  . Alcohol use: No    Alcohol/week: 0.0 standard drinks  . Drug use: No     Allergies   Penicillins, Tetracycline, Atorvastatin, Bactrim [sulfamethoxazole-trimethoprim], Celecoxib, Niacin, Niaspan [niacin er], Rosuvastatin, Simvastatin, and Tetracyclines & related   Review of Systems Review of Systems  Constitutional: Negative for activity change, appetite change and fever.  HENT: Negative for congestion and rhinorrhea.   Respiratory: Negative for cough, chest tightness and shortness of breath.   Cardiovascular: Negative for chest pain.  Gastrointestinal: Negative for abdominal pain, nausea and vomiting.  Genitourinary: Negative for dysuria and hematuria.  Musculoskeletal: Negative for arthralgias, back pain and myalgias.  Skin: Positive for wound. Negative for rash.  Neurological: Positive for headaches. Negative for dizziness, weakness and light-headedness.   all other systems are negative except as noted in the HPI and PMH.     Physical Exam Updated Vital Signs BP 138/75 (BP Location: Right Arm)   Pulse 76   Temp 98 F (36.7 C) (Oral)   Resp 16   SpO2 99%   Physical Exam Vitals signs and nursing note reviewed.  Constitutional:      General: She is not in acute distress.    Appearance: Normal appearance. She is well-developed and normal weight. She is not ill-appearing.  HENT:     Head: Normocephalic and atraumatic.     Comments:  3 cm occipital scalp laceration, hemostatic    Right Ear: Tympanic membrane normal.     Left Ear: Tympanic membrane normal.     Ears:     Comments: No septal hematoma or hemotympanum    Nose: Nose normal. No rhinorrhea.     Mouth/Throat:     Mouth: Mucous membranes are moist.     Pharynx: No oropharyngeal exudate.  Eyes:     Conjunctiva/sclera: Conjunctivae normal.     Pupils: Pupils are equal, round, and reactive to light.  Neck:     Musculoskeletal: Normal range of motion and neck supple.     Comments: No meningismus. Cardiovascular:  Rate and Rhythm: Normal rate and regular rhythm.     Heart sounds: Normal heart sounds. No murmur.  Pulmonary:     Effort: Pulmonary effort is normal. No respiratory distress.     Breath sounds: Normal breath sounds.  Abdominal:     Palpations: Abdomen is soft.     Tenderness: There is no abdominal tenderness. There is no guarding or rebound.  Musculoskeletal: Normal range of motion.        General: No tenderness.     Comments: No T or L-spine pain  Skin:    General: Skin is warm.     Capillary Refill: Capillary refill takes less than 2 seconds.  Neurological:     General: No focal deficit present.     Mental Status: She is alert and oriented to person, place, and time. Mental status is at baseline.     Cranial Nerves: No cranial nerve deficit.     Motor: No abnormal muscle tone.     Coordination: Coordination normal.     Comments:  5/5 strength throughout. CN 2-12 intact.Equal grip strength.   Psychiatric:        Behavior: Behavior normal.      ED Treatments / Results  Labs (all labs ordered are listed, but only abnormal results are displayed) Labs Reviewed - No data to display  EKG EKG Interpretation  Date/Time:  Tuesday May 06 2019 06:12:48 EDT Ventricular Rate:  55 PR Interval:    QRS Duration: 86 QT Interval:  450 QTC Calculation: 431 R Axis:   67 Text Interpretation:  Sinus rhythm Low voltage, precordial leads  Probable anteroseptal infarct, old No significant change was found Confirmed by Ezequiel Essex [phone]) on 05/06/2019 6:20:39 AM   Radiology Ct Head Wo Contrast  Result Date: 05/06/2019 CLINICAL DATA:  Status post trip and fall this morning with a blow to the back of the head. Initial encounter. EXAM: CT HEAD WITHOUT CONTRAST TECHNIQUE: Contiguous axial images were obtained from the base of the skull through the vertex without intravenous contrast. COMPARISON:  Head CT 04/04/2019. FINDINGS: Brain: No evidence of acute infarction, hemorrhage, hydrocephalus, extra-axial collection or mass lesion/mass effect. Mild atrophy and chronic microvascular ischemic change noted. Vascular: No hyperdense vessel or unexpected calcification. Skull: Intact.  No focal lesion. Sinuses/Orbits: Status post cataract surgery.  Otherwise negative. Other: None. IMPRESSION: No acute abnormality. Mild atrophy and chronic microvascular ischemic change. Electronically Signed   By: Inge Rise M.D.   On: 05/06/2019 07:14   Ct Cervical Spine Wo Contrast  Result Date: 05/06/2019 CLINICAL DATA:  Status post trip and fall this morning with a blow to the back of the head. Neck pain. Initial encounter. EXAM: CT CERVICAL SPINE WITHOUT CONTRAST TECHNIQUE: Multidetector CT imaging of the cervical spine was performed without intravenous contrast. Multiplanar CT image reconstructions were also generated. COMPARISON:  None. FINDINGS: Alignment: Normal. Skull base and vertebrae: No acute fracture. No primary bone lesion or focal pathologic process. 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 Soft tissues and spinal canal: No prevertebral fluid or swelling. No visible canal hematoma. Disc levels: There is loss of disc space height and endplate spurring at O2-4, C5-6 and C6-7. Partial autologous fusion across the C6-7 disc interspace noted. Upper chest: Lung apices clear. Other: None. IMPRESSION: No acute abnormality. 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 Cervical  spondylosis. Electronically Signed   By: Inge Rise M.D.   On: 05/06/2019 07:17    Procedures .Marland KitchenLaceration Repair  Date/Time: 05/06/2019 6:58 AM Performed by: Ezequiel Essex, MD Authorized by:  Ezequiel Essex, MD   Consent:    Consent obtained:  Verbal   Consent given by:  Patient   Risks discussed:  Infection, nerve damage, poor wound healing, poor cosmetic result, tendon damage, retained foreign body, vascular damage, pain and need for additional repair Anesthesia (see MAR for exact dosages):    Anesthesia method:  Local infiltration   Local anesthetic:  Lidocaine 2% WITH epi Laceration details:    Location:  Scalp   Scalp location:  Occipital   Length (cm):  3 Repair type:    Repair type:  Simple Pre-procedure details:    Preparation:  Patient was prepped and draped in usual sterile fashion and imaging obtained to evaluate for foreign bodies Exploration:    Hemostasis achieved with:  Direct pressure and epinephrine   Wound exploration: wound explored through full range of motion and entire depth of wound probed and visualized     Wound extent: fascia violated   Treatment:    Area cleansed with:  Betadine   Amount of cleaning:  Standard   Irrigation solution:  Sterile saline   Irrigation method:  Pressure wash   Visualized foreign bodies/material removed: no   Skin repair:    Repair method:  Staples   Number of staples:  6 Approximation:    Approximation:  Close Post-procedure details:    Dressing:  Antibiotic ointment and adhesive bandage   Patient tolerance of procedure:  Tolerated well, no immediate complications   (including critical care time)  Medications Ordered in ED Medications  Tdap (BOOSTRIX) injection 0.5 mL (has no administration in time range)  lidocaine-EPINEPHrine (XYLOCAINE W/EPI) 2 %-1:200000 (PF) injection 20 mL (has no administration in time range)  povidone-iodine (BETADINE) 10 % external solution (has no administration in time range)      Initial Impression / Assessment and Plan / ED Course  I have reviewed the triage vital signs and the nursing notes.  Pertinent labs & imaging results that were available during my care of the patient were reviewed by me and considered in my medical decision making (see chart for details).       Fall with head injury and laceration.  No loss of consciousness.  Neurologically intact.  Tetanus updated.  Laceration repaired as above.  CT scan negative for intracranial injury.  Patient able to ambulate without dizziness.  States she feels at baseline.  Discussed PCP follow-up for suture removal in 1 week.  Return precautions discussed.   Final Clinical Impressions(s) / ED Diagnoses   Final diagnoses:  Fall, initial encounter  Laceration of scalp, initial encounter    ED Discharge Orders    None       Chayne Baumgart, Annie Main, MD 05/06/19 [phone]

## 2019-05-06 NOTE — ED Notes (Signed)
Pt given ginger ale per fluid challenge.

## 2019-05-06 NOTE — ED Triage Notes (Addendum)
Pt reports she got up and thinks she tripped on a stool and fell, striking her head on the dresser around 0300 this morning, lac to the top of the head (bleeding controlled). Main c/o pain at laceration site, no loc, changes in vision or dizziness. Denies blood thinner use.  Pt sister says she got up without her cane. Unknown tetanus.

## 2019-05-06 NOTE — Telephone Encounter (Signed)
Spoke to sister Manus Gunning and shared info from Dr. Delice Lesch regarding stopping Aricept for a few days to see if she feels better. Sister will do this. Patient has f/u with PCP the end of this week and will discuss fatigue then. She has been to endo MD a few weeks ago and thyroid meds adjusted at that time and has labs in the near future. Sister will call back regarding starting on a different med if needed.

## 2019-05-06 NOTE — Telephone Encounter (Signed)
Let's try stopping the medication and see how she feels off the medication. If she does not feel better in a few days, pls talk to PCP about the fatigue as there may be something else going on, last thyroid level last month was abnormal, would have this followed. Pls again let sister know, as it is difficult to understand, that the medication is not a cure, it does not help in a way that her memory loss would stop or go away. It is more of a stabilizing agent, and if she feels better off Aricept, we can start a different one later on. Thanks

## 2019-05-08 ENCOUNTER — Ambulatory Visit (INDEPENDENT_AMBULATORY_CARE_PROVIDER_SITE_OTHER): Payer: Medicare Other | Admitting: Family Medicine

## 2019-05-08 ENCOUNTER — Other Ambulatory Visit: Payer: Self-pay

## 2019-05-08 DIAGNOSIS — S0101XA Laceration without foreign body of scalp, initial encounter: Secondary | ICD-10-CM | POA: Diagnosis not present

## 2019-05-08 DIAGNOSIS — G4700 Insomnia, unspecified: Secondary | ICD-10-CM | POA: Diagnosis not present

## 2019-05-08 DIAGNOSIS — R413 Other amnesia: Secondary | ICD-10-CM | POA: Diagnosis not present

## 2019-05-08 NOTE — Progress Notes (Signed)
Virtual Visit via Video Note  I connected with Felicia Kelly  on 05/08/19 at  3:00 PM EDT by a video enabled telemedicine application and verified that I am speaking with the correct person using two identifiers.  Location patient: home Location provider:work or home office Persons participating in the virtual visit: patient, provider, patient sister  I discussed the limitations of evaluation and management by telemedicine and the availability of in person appointments. The patient expressed understanding and agreed to proceed.   HPI:  Acute visit for sleep issues: -trouble falling asleep sometimes -she reports has always had trouble sleeping -she has difficulty falling asleep 3-4 days per week -occ gets up to use the bathroom -she just doesn't feel tired when goes to bed -usually goes to bed around 9-9:30 -usually watches TV for a few hours in bed before bedtime -usually sleeps in until 9-11 -fall 7/21 - got 7 staples, doing ok today - mechanical fall when got up to go to the bathroom - needs appt for removal of staples next week. Did bleed a little yesterday at [facility] - ok today. Wonders when to remove staples. -trying trial off aricept because it makes her tired -has counselor - Felicia Kelly, but has not seen in a long time -no sig change in behavior  ROS: See pertinent positives and negatives per HPI.  Past Medical History:  Diagnosis Date  . Aortic atherosclerosis ([place])    a. noted on CT 01/2018.  . Arthritis   . Colonic polyp   . Coronary atherosclerosis    a. noted on CT 01/2018.  Marland Kitchen Genital herpes   . Hyperlipidemia   . Hypertension   . Mild carotid artery disease ([place])   . Osteoporosis   . Pulmonary nodules    a. folloewd by PCP by serial Ct.  . Thyroid disease    seeing Dr. Buddy Duty  . Tremor     Past Surgical History:  Procedure Laterality Date  . ABDOMINAL HYSTERECTOMY  1988   no cancer - reports total with bilat oophorectomy  . APPENDECTOMY  1988  . BREAST CYST  EXCISION Bilateral   . BREAST SURGERY  1985   biopsy  . FOOT SURGERY    . TOTAL HIP ARTHROPLASTY      Family History  Problem Relation Age of Onset  . Cancer Father        pancreatic  . Pancreatic cancer Father   . Heart disease Father   . Heart attack Sister 21  . Arthritis Mother   . Hyperlipidemia Mother   . Hypertension Mother   . Heart disease Mother   . Diabetes Mother   . Liver disease Brother   . Breast cancer Neg Hx     SOCIAL HX: see hpi   Current Outpatient Medications:  .  donepezil (ARICEPT) 10 MG tablet, Take 1/2 tablet daily for 2 weeks, then increase to 1 tablet daily (Patient taking differently: Take 10 mg by mouth daily. ), Disp: 30 tablet, Rfl: 11 .  doxycycline (VIBRAMYCIN) 100 MG capsule, Take 100 mg by mouth. As needed prior to dental procedures, Disp: , Rfl:  .  levothyroxine (SYNTHROID, LEVOTHROID) 50 MCG tablet, Take 50 mcg by mouth daily before breakfast., Disp: , Rfl:  .  losartan-hydrochlorothiazide (HYZAAR) 100-12.5 MG tablet, Take 1 tablet by mouth daily., Disp: 90 tablet, Rfl: 1 .  pravastatin (PRAVACHOL) 80 MG tablet, TAKE 1 TABLET (80 MG TOTAL) BY MOUTH DAILY., Disp: 90 tablet, Rfl: 1 .  valACYclovir (VALTREX) 500 MG tablet,  TAKE 1 TABLET (500 MG TOTAL) BY MOUTH DAILY. (Patient taking differently: Take 500 mg by mouth daily. ), Disp: 90 tablet, Rfl: 1  EXAM:  VITALS per patient if applicable:  GENERAL: alert, oriented, appears well and in no acute distress  HEENT: atraumatic, conjunttiva clear, no obvious abnormalities on inspection of external nose and ears  NECK: normal movements of the head and neck  LUNGS: on inspection no signs of respiratory distress, breathing rate appears normal, no obvious gross SOB, gasping or wheezing  CV: no obvious cyanosis  MS: moves all visible extremities without noticeable abnormality  PSYCH/NEURO: pleasant and cooperative, no obvious depression or anxiety, speech and thought processing grossly  intact  ASSESSMENT AND PLAN:  Discussed the following assessment and plan:  Insomnia, unspecified type -  -Discussed insomnia, treatment options, risks and precautions. After a lengthy discussion opted for the following: -sleep hygiene and CBT to start, possible low dose 1mg  melatonin every other night  -sister agrees to arrange appt for CBT with counselor -follow up with me in 3-4 months  Memory loss - -seeing neurology, feels better off aricept in terms of energy -they are going to consider and weigh risks/benefits and discuss with neurology  Laceration of scalp without foreign body, initial encounter - -advised visit next week Tues -Thurs for eval and staple removal   -follow up Dr. Maudie Kelly 3-4 months    I discussed the assessment and treatment plan with the patient. The patient was provided an opportunity to ask questions and all were answered. The patient agreed with the plan and demonstrated an understanding of the instructions.   The patient was advised to call back or seek an in-person evaluation if the symptoms worsen or if the condition fails to improve as anticipated.   Follow up instructions: Advised assistant Felicia Kelly to help patient arrange the following: -needs in office visit next week tues-thurs for staple removal -keep appt with Dr. Ethlyn Kelly -follow up with Dr. Maudie Kelly in 3-4 months, they did want to schedule this  Felicia Kern, DO

## 2019-05-08 NOTE — Patient Instructions (Signed)
Follow up next week for suture removal in office - if someone does not contact you to arrange this appointment, please call our office.  Follow up with Dr. Maudie Mercury in 3-4 months.  FOR IMPROVED SLEEP AND TO RESET YOUR SLEEP SCHEDULE: []  exercise 30 minutes daily  []  go to bed and wake up at the same time  []  keep bedroom cool, dark and quiet  []  reserve bed for sleep - do not read, watch TV, etc in bed or engage in screen time for 1-2 hours prior to bedtime  []  If you toss and turn more then 15-20 minutes get out of bed and list any thoughts on paper/do quiet activity then go back to bed; repeat as needed; do not worry about when you eventually fall asleep - still get up at the same time daily and turn on lights and take shower  [] get counseling  []  some people find that a half dose of  melatonin, tylenol pm or unisom on a few nights per week is helpful initially for a few weeks  [] seek help and treat any depression or anxiety  [] prescription strength sleep medications should only be used in severe cases of insomnia if other measures fail and should be used sparingly

## 2019-05-09 ENCOUNTER — Telehealth: Payer: Self-pay | Admitting: *Deleted

## 2019-05-09 ENCOUNTER — Telehealth: Payer: Self-pay | Admitting: Neurology

## 2019-05-09 MED ORDER — RIVASTIGMINE TARTRATE 1.5 MG PO CAPS: 1.5000 mg | ORAL_CAPSULE | Freq: Two times a day (BID) | ORAL | 6 refills | Status: DC

## 2019-05-09 NOTE — Telephone Encounter (Signed)
Exelon 1.5 mg BID e-scribed into [facility] on [address]. 6 mos appt with Dr. Delice Lesch made. Sister Manus Gunning aware of all the above and to call if any questions or problems.

## 2019-05-09 NOTE — Telephone Encounter (Signed)
I called Mrs Felicia Kelly and left a detailed message the appt was scheduled on 7/29 and if this would not work to call back to reschedule.

## 2019-05-09 NOTE — Telephone Encounter (Signed)
-----   Message from [name], DO sent at 05/08/2019  8:04 PM EDT ----- -in office visit for suture removal next week-follow up with Dr. Maudie Mercury in 3-4 months-keep visit with Dr. Ethlyn Gallery

## 2019-05-09 NOTE — Telephone Encounter (Signed)
We can start a different medication, pls let sister know it is a twice a day medication, Exelon. We will start a very low dose 1.5mg  BID, side effects can be diarrhea, nausea, dizziness, but it is such a low dose that usually people tolerate it fine. The main thing is it is a twice a day medication, compared to the Aricept which was once a day. Thanks

## 2019-05-09 NOTE — Telephone Encounter (Signed)
I left a detailed message for the pts sister to return my call to schedule appts as below.

## 2019-05-09 NOTE — Telephone Encounter (Signed)
Dr Delice Lesch see below for update from sister regarding patient stopping her meds for a few days. Thanks.

## 2019-05-09 NOTE — Telephone Encounter (Signed)
Copied from [place] [id number]. Topic: Appointment Scheduling - Scheduling Inquiry for Clinic >> May 09, 2019 11:01 AM Burchel, Abbi R wrote: Reason for CRM: Pt's sister Joaquim Lai) called to request an appt re: staple removal as well as a f/up.  Please call Joaquim Lai to sched: [phone] >> May 09, 2019 12:16 PM Cox, Melburn Hake, CMA wrote: Can this be scheduled with [facility], and if so, when? (as it needs to be in-office)

## 2019-05-09 NOTE — Telephone Encounter (Signed)
Daughter is calling in about memory meds. She said she is very weak and fatigue. She was instructed to take her off meds and she is actually better off the medication and wanted to send in an update. Thanks!

## 2019-05-14 ENCOUNTER — Other Ambulatory Visit: Payer: Self-pay

## 2019-05-14 ENCOUNTER — Encounter: Payer: Self-pay | Admitting: Family Medicine

## 2019-05-14 ENCOUNTER — Ambulatory Visit (INDEPENDENT_AMBULATORY_CARE_PROVIDER_SITE_OTHER): Payer: Medicare Other | Admitting: Family Medicine

## 2019-05-14 VITALS — BP 100/60 | HR 57 | Temp 98.2°F | Ht 62.0 in | Wt 156.9 lb

## 2019-05-14 DIAGNOSIS — E876 Hypokalemia: Secondary | ICD-10-CM

## 2019-05-14 DIAGNOSIS — I10 Essential (primary) hypertension: Secondary | ICD-10-CM

## 2019-05-14 DIAGNOSIS — Z4802 Encounter for removal of sutures: Secondary | ICD-10-CM

## 2019-05-14 LAB — BASIC METABOLIC PANEL
BUN: 18 mg/dL (ref 6–23)
CO2: 32 mEq/L (ref 19–32)
Calcium: 9.5 mg/dL (ref 8.4–10.5)
Chloride: 104 mEq/L (ref 96–112)
Creatinine, Ser: 0.83 mg/dL (ref 0.40–1.20)
GFR: 65.93 mL/min (ref >60.00)
Glucose, Bld: 113 mg/dL — ABNORMAL HIGH (ref 70–99)
Potassium: 3.4 mEq/L — ABNORMAL LOW (ref 3.5–5.1)
Sodium: 143 mEq/L (ref 135–145)

## 2019-05-14 NOTE — Progress Notes (Signed)
Felicia Kelly Felicia Kelly DOB: 12-08-1935 Encounter date: 05/14/2019    Felicia Kelly DOB: 1936/09/01 Encounter date: 05/14/2019  This is a 83 y.o. female who presents with Chief Complaint  Patient presents with  . Suture / Staple Removal    History of present illness: Pt presents for staple removal. Sutures were placed 8 days ago in the ED. She got up to use bathroom and hit head on top of dresser. No LOC, no dizziness/lightheadedness preceeding injury. 6 staples over 3cm wound.   Feeling ok. No drainage from wound, no pain, no bleeding, no fever, no headache.   Her sister is here with her today and brings up the issue of lower blood pressures.  Beta-blocker was recently stopped and this seemed to help with her having low blood pressures.  They have been checking at home regularly and most pressures are in the range of 102- 128/55-78 with a heart rate averaging 50-70.    Allergies  Allergen Reactions  . Penicillins Swelling and Rash    ALL CILLINS  . Tetracycline Itching  . Atorvastatin Other (See Comments)    Leg cramps Leg cramps  . Bactrim [Sulfamethoxazole-Trimethoprim] Itching  . Celecoxib Other (See Comments)    Memory disturbance, slowed thinking Memory disturbance, slowed thinking  . Niacin Other (See Comments)    Caused elevated BP  . Niaspan [Niacin Er]     Elevated BP  . Rosuvastatin Other (See Comments)    Elevated CPK  . Simvastatin Other (See Comments)    Leg cramps Leg cramps  . Tetracyclines & Related Itching   Current Meds  Medication Sig  . donepezil (ARICEPT) 10 MG tablet Take 1/2 tablet daily for 2 weeks, then increase to 1 tablet daily (Patient taking differently: Take 10 mg by mouth daily. )  . doxycycline (VIBRAMYCIN) 100 MG capsule Take 100 mg by mouth. As needed prior to dental procedures  . levothyroxine (SYNTHROID, LEVOTHROID) 50 MCG tablet Take 50 mcg by mouth daily before breakfast.  . losartan-hydrochlorothiazide (HYZAAR) 100-12.5 MG  tablet Take 1 tablet by mouth daily.  . pravastatin (PRAVACHOL) 80 MG tablet TAKE 1 TABLET (80 MG TOTAL) BY MOUTH DAILY.  . rivastigmine (EXELON) 1.5 MG capsule Take 1 capsule (1.5 mg total) by mouth 2 (two) times daily.  . valACYclovir (VALTREX) 500 MG tablet TAKE 1 TABLET (500 MG TOTAL) BY MOUTH DAILY. (Patient taking differently: Take 500 mg by mouth daily. )    Review of Systems  Constitutional: Negative for chills, fatigue and fever.  Respiratory: Negative for cough, chest tightness, shortness of breath and wheezing.   Cardiovascular: Negative for chest pain, palpitations and leg swelling.  Skin: Positive for wound (healing well).  Neurological: Negative for dizziness, light-headedness and headaches.    Objective:  BP 100/60 (BP Location: Left Arm, Patient Position: Sitting, Cuff Size: Normal)   Pulse (!) 57   Temp 98.2 F (36.8 C) (Temporal)   Ht 5\' 2"  (1.575 m)   Wt 156 lb 14.4 oz (71.2 kg)   SpO2 96%   BMI 28.70 kg/m   Weight: 156 lb 14.4 oz (71.2 kg)   BP Readings from Last 3 Encounters:  05/14/19 100/60  05/06/19 124/62  04/10/19 134/72   Wt Readings from Last 3 Encounters:  05/14/19 156 lb 14.4 oz (71.2 kg)  04/02/19 157 lb 6.5 oz (71.4 kg)  02/14/19 157 lb (71.2 kg)    Physical Exam Constitutional:      Appearance: Normal appearance.  HENT:     Head:  Normocephalic.  Skin:    Comments: Laceration left side of scalp appears to be healing well with wound edges well approximated.  No erythema, drainage, warmth noted.  After skin was cleansed with alcohol, 6 staples were easily removed from the scalp.  Patient tolerated this well.  Neurological:     Mental Status: She is alert.     Assessment/Plan 1. Encounter for staple removal Staples removed without difficulty.  Okay to wash hair, but be gentle around the area of healing wound. - PR REMOVAL OF SUTURES  2. Hypokalemia We will recheck blood work today.  This is already been ordered by Dr. Maudie Mercury. - Basic  metabolic panel  3. Benign essential hypertension Blood pressures are still in the lower end of normal at home.  I have encouraged him to check in the afternoon and evening after she has had her blood pressure medication.  We discussed that we may need to alter medication or consider leaving off diuretic, especially if potassium remains low.  She does have a follow-up scheduled with me next week.    Return establish care visit scheduled for next week.    Micheline Rough, MD

## 2019-05-15 DIAGNOSIS — M25511 Pain in right shoulder: Secondary | ICD-10-CM | POA: Diagnosis not present

## 2019-05-19 ENCOUNTER — Ambulatory Visit: Payer: Medicare Other

## 2019-05-21 ENCOUNTER — Other Ambulatory Visit: Payer: Self-pay

## 2019-05-21 ENCOUNTER — Ambulatory Visit (INDEPENDENT_AMBULATORY_CARE_PROVIDER_SITE_OTHER): Payer: Medicare Other | Admitting: Family Medicine

## 2019-05-21 ENCOUNTER — Encounter: Payer: Self-pay | Admitting: Family Medicine

## 2019-05-21 DIAGNOSIS — R739 Hyperglycemia, unspecified: Secondary | ICD-10-CM | POA: Diagnosis not present

## 2019-05-21 DIAGNOSIS — E038 Other specified hypothyroidism: Secondary | ICD-10-CM

## 2019-05-21 DIAGNOSIS — E559 Vitamin D deficiency, unspecified: Secondary | ICD-10-CM

## 2019-05-21 DIAGNOSIS — E042 Nontoxic multinodular goiter: Secondary | ICD-10-CM

## 2019-05-21 DIAGNOSIS — R413 Other amnesia: Secondary | ICD-10-CM | POA: Diagnosis not present

## 2019-05-21 DIAGNOSIS — E876 Hypokalemia: Secondary | ICD-10-CM | POA: Diagnosis not present

## 2019-05-21 DIAGNOSIS — E7800 Pure hypercholesterolemia, unspecified: Secondary | ICD-10-CM | POA: Diagnosis not present

## 2019-05-21 DIAGNOSIS — I10 Essential (primary) hypertension: Secondary | ICD-10-CM | POA: Diagnosis not present

## 2019-05-21 MED ORDER — PRAVASTATIN SODIUM 80 MG PO TABS: 80.0000 mg | ORAL_TABLET | Freq: Every day | ORAL | 1 refills | Status: DC

## 2019-05-21 MED ORDER — POTASSIUM CHLORIDE ER 10 MEQ PO TBCR: 10.0000 meq | EXTENDED_RELEASE_TABLET | ORAL | 2 refills | Status: DC

## 2019-05-21 NOTE — Progress Notes (Signed)
Virtual Visit via Video Note  I connected with Felicia Kelly   on 05/21/19 at 10:30 AM EDT by a video enabled telemedicine application and verified that I am speaking with the correct person using two identifiers.  Location patient: home Location provider:work office Persons participating in the virtual visit: patient, provider  I discussed the limitations of evaluation and management by telemedicine and the availability of in person appointments. The patient expressed understanding and agreed to proceed.   Felicia Kelly DOB: [birth year] Encounter date: 05/21/2019  This is a 83 y.o. female who presents to establish care. Chief Complaint  Patient presents with  . Establish Care    History of present illness: Scalp is looking good. No drainage. No tenderness.   HTN: has recently stopped beta blocker. They were checking at home to report back and see if we could stop/cut back any of current medications. This morning 128/69 pulse 54; yesterday 130/75 61 pulse, 127/70 w 51 pulse.   CAD/HL:pravastatin 80mg  daily  Hypothyroid: synthroid 110mcg daily  Osteopenia: last DEXA was 07/2018. Not taking vitamin D regularly but does have at home.   Hx of skin cancer (basal cell). Follows yearly Dr. Juel Burrow office.   Pulmonary nodules: last CT in 01/2018 was stable. Could consider repeat CT for stability this year.   Hyperglycemia: diet controlled.  Vitamin D GNF:AOZH'Y been taking the vitamin D regularly.   Memory loss: seeing neurology. Felt better energy level wise off aricept. Made her tired, weak. No side effects noted with rivastigmine.   Insomnia: recently discussed with HK- elected for sleep hygiene/CBT to start with low dose melatonin which sister was going to help arrange. Has had trouble going to sleep all her life. Not trouble going to sleep now. Started the 1mg  melatonin and does better with this nightly. Wakes at 6:30-7 every morning. Falls asleep with tv at bedtime -  likely falling asleep around 10-10:30. More rested when she wakes.   Not exercising regularly since May. Walks on driveway daily, but not in gym with COVID.    Past Medical History:  Diagnosis Date  . Aortic atherosclerosis ([place])    a. noted on CT 01/2018.  . Arthritis   . Colonic polyp   . Coronary atherosclerosis    a. noted on CT 01/2018.  Marland Kitchen Genital herpes   . Hyperlipidemia   . Hypertension   . Mild carotid artery disease ([place])   . Osteoporosis   . Pulmonary nodules    a. folloewd by PCP by serial Ct.  . Thyroid disease    seeing Dr. Buddy Duty  . Tremor    Past Surgical History:  Procedure Laterality Date  . ABDOMINAL HYSTERECTOMY  1988   no cancer - reports total with bilat oophorectomy  . APPENDECTOMY  1988  . BREAST CYST EXCISION Bilateral   . BREAST SURGERY  1985   biopsy  . FOOT SURGERY     multiple sugeries, remote  . TOTAL HIP ARTHROPLASTY Bilateral    Allergies  Allergen Reactions  . Penicillins Swelling and Rash    ALL CILLINS  . Tetracycline Itching  . Atorvastatin Other (See Comments)    Leg cramps Leg cramps  . Bactrim [Sulfamethoxazole-Trimethoprim] Itching  . Celecoxib Other (See Comments)    Memory disturbance, slowed thinking Memory disturbance, slowed thinking  . Niacin Other (See Comments)    Caused elevated BP  . Niaspan [Niacin Er]     Elevated BP  . Rosuvastatin Other (See Comments)    Elevated  CPK  . Simvastatin Other (See Comments)    Leg cramps Leg cramps  . Tetracyclines & Related Itching   No outpatient medications have been marked as taking for the 05/21/19 encounter (Office Visit) with Caren Macadam, MD.   Social History   Tobacco Use  . Smoking status: Former Smoker    Quit date: 10/16/1978    Years since quitting: 40.6  . Smokeless tobacco: Never Used  . Tobacco comment: 1963 when her father died;  smoked for a brief period of time  Substance Use Topics  . Alcohol use: No    Alcohol/week: 0.0 standard drinks    Family History  Problem Relation Age of Onset  . Pancreatic cancer Father   . Heart disease Father   . Heart attack Sister 35  . Arthritis Mother   . Hyperlipidemia Mother   . Hypertension Mother   . Heart disease Mother   . Diabetes Mother   . Liver disease Brother        cancer of bile duct  . Heart disease Maternal Grandfather   . Heart attack Brother   . Breast cancer Neg Hx      Review of Systems  Constitutional: Negative for chills, fatigue and fever.  Respiratory: Negative for cough, chest tightness, shortness of breath and wheezing.   Cardiovascular: Negative for chest pain, palpitations and leg swelling.  Neurological: Negative for headaches.  Psychiatric/Behavioral: Negative for sleep disturbance. The patient is not nervous/anxious.     Objective:  There were no vitals taken for this visit.      BP Readings from Last 3 Encounters:  05/14/19 100/60  05/06/19 124/62  04/10/19 134/72   Wt Readings from Last 3 Encounters:  05/14/19 156 lb 14.4 oz (71.2 kg)  04/02/19 157 lb 6.5 oz (71.4 kg)  02/14/19 157 lb (71.2 kg)    EXAM:  GENERAL: alert, oriented, appears well and in no acute distress  HEENT: atraumatic, conjunctiva clear, no obvious abnormalities on inspection of external nose and ears  NECK: normal movements of the head and neck  LUNGS: on inspection no signs of respiratory distress, breathing rate appears normal, no obvious gross SOB, gasping or wheezing  CV: no obvious cyanosis  MS: moves all visible extremities without noticeable abnormality  PSYCH/NEURO: pleasant and cooperative, no obvious depression or anxiety, speech and thought processing grossly intact. Has difficulty with remembering some medical details; sister answers many questions. Somewhat masked facies.  SKIN: no facial or neck abnormalities noted.  Assessment/Plan  1. Memory loss Following with neurology.  She has done better on the realistic mean in terms of tolerance.   Has upcoming appointment.  Blood work below did not look like it got completed when previously ordered so I have reordered this to complete with next set of blood work. - Vitamin B12; Future - RPR; Future  2. Other specified hypothyroidism Follows with specialist.  Will be getting recheck shortly.  3. Hyperglycemia We will recheck A1c for stability.  4. Benign essential hypertension Has been stable.  Continue current medication. - Comprehensive metabolic panel; Future  5. Hypokalemia Recurring issue.  Add potassium 10 mEq 3 times weekly.  Will recheck in 1 month for stability. - potassium chloride (K-DUR) 10 MEQ tablet; Take 1 tablet (10 mEq total) by mouth 3 (three) times a week.  Dispense: 30 tablet; Refill: 2 - Comprehensive metabolic panel; Future  6. Hypercholesterolemia Tolerating medication well.  Continue current dose.  Will recheck lipid panel with next blood work. -  pravastatin (PRAVACHOL) 80 MG tablet; Take 1 tablet (80 mg total) by mouth daily.  Dispense: 90 tablet; Refill: 1 - Lipid panel; Future  7. Multinodular goiter Followed by specialist - TSH; Future  8. Vitamin D deficiency Not taking regularly.  Will check to make sure maintaining levels.  Encouraged calcium supplementation if she is not getting 3 calcium servings daily. - VITAMIN D 25 Hydroxy (Vit-D Deficiency, Fractures); Future   Return in about 3 months (around 08/21/2019) for Chronic condition visit (bloodwork sooner in 1 mo).   I discussed the assessment and treatment plan with the patient. The patient was provided an opportunity to ask questions and all were answered. The patient agreed with the plan and demonstrated an understanding of the instructions.   The patient was advised to call back or seek an in-person evaluation if the symptoms worsen or if the condition fails to improve as anticipated.  I provided 25 minutes of non-face-to-face time during this encounter.   Micheline Rough, MD

## 2019-05-22 ENCOUNTER — Telehealth: Payer: Self-pay | Admitting: *Deleted

## 2019-05-22 NOTE — Telephone Encounter (Signed)
Please call to schedule appointment.

## 2019-05-22 NOTE — Telephone Encounter (Signed)
I left a detailed message for the pt to call back and schedule appts as below.

## 2019-05-22 NOTE — Telephone Encounter (Signed)
Pt is calling back to sch blood work etc. I called office 3x no answer

## 2019-05-22 NOTE — Telephone Encounter (Signed)
-----   Message from Caren Macadam, MD sent at 05/21/2019  5:34 PM EDT ----- Please schedule bloodwork in a month; ccv in 3 months (this would be ok with myself or with HK)

## 2019-05-22 NOTE — Telephone Encounter (Signed)
LMVM for the patient to contact the office to schedule her appointment for labs.

## 2019-05-23 DIAGNOSIS — E890 Postprocedural hypothyroidism: Secondary | ICD-10-CM | POA: Diagnosis not present

## 2019-05-24 DIAGNOSIS — M25511 Pain in right shoulder: Secondary | ICD-10-CM | POA: Diagnosis not present

## 2019-05-30 DIAGNOSIS — M25511 Pain in right shoulder: Secondary | ICD-10-CM | POA: Diagnosis not present

## 2019-06-03 ENCOUNTER — Other Ambulatory Visit: Payer: Self-pay

## 2019-06-03 DIAGNOSIS — Z20822 Contact with and (suspected) exposure to covid-19: Secondary | ICD-10-CM

## 2019-06-03 DIAGNOSIS — R6889 Other general symptoms and signs: Secondary | ICD-10-CM | POA: Diagnosis not present

## 2019-06-04 ENCOUNTER — Other Ambulatory Visit: Payer: Medicare Other

## 2019-06-04 LAB — NOVEL CORONAVIRUS, NAA: SARS-CoV-2, NAA: NOT DETECTED

## 2019-06-05 ENCOUNTER — Telehealth: Payer: Self-pay | Admitting: General Practice

## 2019-06-05 NOTE — Telephone Encounter (Signed)
Negative COVID results given. Patient results "NOT Detected." Caller expressed understanding. ° °

## 2019-06-10 ENCOUNTER — Telehealth: Payer: Self-pay | Admitting: Family Medicine

## 2019-06-10 NOTE — Telephone Encounter (Signed)
Pts sister called stating pt has been experiencing severe leg cramps due to her pravastatin. Pts sister is requesting to have an alternate medication sent in for pt. Please advise.   [id number] [address], [place] [address]  PhoneSE:[phone] FaxXO:[phone]  Not a 24 hour pharmacy; exact hours not known.

## 2019-06-11 NOTE — Telephone Encounter (Signed)
Left a message at the pts cell number to return my call.  CRM also created.

## 2019-06-11 NOTE — Telephone Encounter (Signed)
She has been on the pravastatin for quite awhile. She denied cramps when we previously spoke. Is this something new for her?   I would advise stopping the pravastatin for 2 weeks and then restarting at half tab (half of current dose) and seeing how she does with this. Of the statins, pravastatin is usually pretty good with regards to cramping. If still having cramping with half dose then let me know.

## 2019-06-12 NOTE — Telephone Encounter (Signed)
Left a message for Mrs Felicia Kelly to return my call.

## 2019-06-12 NOTE — Telephone Encounter (Signed)
Sister Ms. Reece returned call. Please call [phone]

## 2019-06-13 NOTE — Telephone Encounter (Signed)
Pt returned phone call, tried calling office 3 x. Please advise

## 2019-06-14 DIAGNOSIS — Z23 Encounter for immunization: Secondary | ICD-10-CM | POA: Diagnosis not present

## 2019-06-16 NOTE — Telephone Encounter (Signed)
Left a detailed message with the information below for Mrs Felicia Kelly.  I asked that she call back to let us know what the pt would prefer at this point.

## 2019-06-17 ENCOUNTER — Telehealth: Payer: Self-pay | Admitting: *Deleted

## 2019-06-17 NOTE — Telephone Encounter (Signed)
Pt called in requesting a copy of her COVID-19 test result be sent to her.   She does not have internet access or a computer.    I let her know we can mail it to her.   It would be 5-7 business days.   She was agreeable to this.   "I'm going into an assisted living facility and they required I give them a copy of my result".     I submitted her request for her result to be mailed to her.    I verified her address was correct which it is.

## 2019-06-19 DIAGNOSIS — D2271 Melanocytic nevi of right lower limb, including hip: Secondary | ICD-10-CM | POA: Diagnosis not present

## 2019-06-19 DIAGNOSIS — L218 Other seborrheic dermatitis: Secondary | ICD-10-CM | POA: Diagnosis not present

## 2019-06-19 DIAGNOSIS — D485 Neoplasm of uncertain behavior of skin: Secondary | ICD-10-CM | POA: Diagnosis not present

## 2019-06-19 DIAGNOSIS — D225 Melanocytic nevi of trunk: Secondary | ICD-10-CM | POA: Diagnosis not present

## 2019-06-19 DIAGNOSIS — Z1283 Encounter for screening for malignant neoplasm of skin: Secondary | ICD-10-CM | POA: Diagnosis not present

## 2019-06-20 NOTE — Telephone Encounter (Signed)
Left message for the pts sister to return my call.  CRM left with detailed instructions to relay message from PCP as below.

## 2019-06-20 NOTE — Telephone Encounter (Signed)
Pt's sister is returning [place] call.

## 2019-06-20 NOTE — Telephone Encounter (Signed)
See note

## 2019-06-23 DIAGNOSIS — Z23 Encounter for immunization: Secondary | ICD-10-CM | POA: Diagnosis not present

## 2019-06-24 ENCOUNTER — Other Ambulatory Visit (INDEPENDENT_AMBULATORY_CARE_PROVIDER_SITE_OTHER): Payer: Medicare Other

## 2019-06-24 ENCOUNTER — Other Ambulatory Visit: Payer: Self-pay

## 2019-06-24 DIAGNOSIS — E559 Vitamin D deficiency, unspecified: Secondary | ICD-10-CM | POA: Diagnosis not present

## 2019-06-24 DIAGNOSIS — R739 Hyperglycemia, unspecified: Secondary | ICD-10-CM

## 2019-06-24 DIAGNOSIS — E7800 Pure hypercholesterolemia, unspecified: Secondary | ICD-10-CM | POA: Diagnosis not present

## 2019-06-24 DIAGNOSIS — R413 Other amnesia: Secondary | ICD-10-CM | POA: Diagnosis not present

## 2019-06-24 DIAGNOSIS — E876 Hypokalemia: Secondary | ICD-10-CM

## 2019-06-24 DIAGNOSIS — I10 Essential (primary) hypertension: Secondary | ICD-10-CM

## 2019-06-24 DIAGNOSIS — E042 Nontoxic multinodular goiter: Secondary | ICD-10-CM | POA: Diagnosis not present

## 2019-06-24 LAB — LIPID PANEL
Cholesterol: 214 mg/dL — ABNORMAL HIGH (ref 0–200)
HDL: 45.2 mg/dL (ref >39.00)
LDL Cholesterol: 132 mg/dL — ABNORMAL HIGH (ref 0–99)
NonHDL: 168.94
Total CHOL/HDL Ratio: 5
Triglycerides: 187 mg/dL — ABNORMAL HIGH (ref 0.0–149.0)
VLDL: 37.4 mg/dL (ref 0.0–40.0)

## 2019-06-24 LAB — COMPREHENSIVE METABOLIC PANEL
ALT: 41 U/L — ABNORMAL HIGH (ref 0–35)
AST: 24 U/L (ref 0–37)
Albumin: 4.2 g/dL (ref 3.5–5.2)
Alkaline Phosphatase: 81 U/L (ref 39–117)
BUN: 19 mg/dL (ref 6–23)
CO2: 29 mEq/L (ref 19–32)
Calcium: 9.7 mg/dL (ref 8.4–10.5)
Chloride: 105 mEq/L (ref 96–112)
Creatinine, Ser: 0.8 mg/dL (ref 0.40–1.20)
GFR: 68.77 mL/min (ref >60.00)
Glucose, Bld: 104 mg/dL — ABNORMAL HIGH (ref 70–99)
Potassium: 4.1 mEq/L (ref 3.5–5.1)
Sodium: 142 mEq/L (ref 135–145)
Total Bilirubin: 0.4 mg/dL (ref 0.2–1.2)
Total Protein: 6.9 g/dL (ref 6.0–8.3)

## 2019-06-24 LAB — VITAMIN B12: Vitamin B-12: 259 pg/mL (ref 211–911)

## 2019-06-24 LAB — TSH: TSH: 1.41 u[IU]/mL (ref 0.35–4.50)

## 2019-06-24 LAB — HEMOGLOBIN A1C: Hgb A1c MFr Bld: 6.2 % (ref 4.6–6.5)

## 2019-06-24 LAB — VITAMIN D 25 HYDROXY (VIT D DEFICIENCY, FRACTURES): VITD: 23.86 ng/mL — ABNORMAL LOW (ref 30.00–100.00)

## 2019-06-24 NOTE — Telephone Encounter (Signed)
Spoke with the pts sister today as patient had a lab appt.  She stated the pt was off for 2 weeks and she restarted at half dose and the pt has not complained of cramps since.  Message sent to Dr Ethlyn Gallery.

## 2019-06-25 ENCOUNTER — Telehealth: Payer: Self-pay | Admitting: Family Medicine

## 2019-06-25 LAB — RPR: RPR Ser Ql: NONREACTIVE

## 2019-06-25 NOTE — Telephone Encounter (Signed)
I left a detailed message with the information below for Mrs Felicia Kelly.

## 2019-06-25 NOTE — Telephone Encounter (Signed)
Pts sister Altamese Dilling) dropped off a DNR form to be completed by the provider.  Sister stated that the pt will be going in to a Assisted Living facility on September 15th and would like to be called [phone] pick the form up before that.  Form placed in providers folder for completion.

## 2019-06-25 NOTE — Telephone Encounter (Signed)
Great! Let's stay at this half dose for a couple more weeks. If tolerating ok then can go back to previous dose. Let me know if cramping restarts.

## 2019-06-27 DIAGNOSIS — H903 Sensorineural hearing loss, bilateral: Secondary | ICD-10-CM | POA: Diagnosis not present

## 2019-06-27 DIAGNOSIS — H6123 Impacted cerumen, bilateral: Secondary | ICD-10-CM | POA: Diagnosis not present

## 2019-06-30 NOTE — Telephone Encounter (Signed)
Patient's sister Altamese Dilling is calling to see if she can pick up DNR this afternoon. The patient is going into an Assisted Living facility tomorrow. Please advise when the form is completed. Thank you [phone]

## 2019-06-30 NOTE — Telephone Encounter (Signed)
I need to have direct conversation with patient in order to sign this. We can give them form and have attending at assisted living sign or we could sign after conversation with her?

## 2019-06-30 NOTE — Telephone Encounter (Signed)
Left a detailed message at the pts sister's number below and asked that she return my call with her preference.

## 2019-06-30 NOTE — Telephone Encounter (Signed)
Patient's sister Altamese Dilling is calling back for Denice Paradise. Please advise [phone]

## 2019-07-01 NOTE — Telephone Encounter (Signed)
Left a message for Mrs Loma Sousa to return my call.

## 2019-07-02 NOTE — Telephone Encounter (Signed)
Gardiner Ramus is returning a call to Pricilla Holm regarding patient.  Tried the office, but no answer.  Please call her back at [phone]

## 2019-07-03 NOTE — Telephone Encounter (Signed)
Mrs Felicia Kelly is returning call to [place]. Attempt to call office but no answer.

## 2019-07-03 NOTE — Telephone Encounter (Signed)
Left a message for Mrs Felicia Kelly to return my call.

## 2019-07-11 ENCOUNTER — Telehealth: Payer: Self-pay | Admitting: *Deleted

## 2019-07-11 NOTE — Telephone Encounter (Signed)
Mrs Felicia Kelly called back and was informed of the message below.  She agreed to check with the administration office at the patients residence in regards to the DNR form.

## 2019-07-11 NOTE — Telephone Encounter (Signed)
Copied from [place] [id number]. Topic: General - Other >> Jul 10, 2019 11:27 AM Yvette Rack wrote: Reason for CRM: Pt sister Gardiner Ramus stated that she needs Dr. Ethlyn Gallery to sign a DNR form. Cb# [phone]

## 2019-07-11 NOTE — Telephone Encounter (Signed)
Left a message for Mrs Felicia Kelly to return my call.

## 2019-07-22 ENCOUNTER — Other Ambulatory Visit: Payer: Self-pay

## 2019-07-22 ENCOUNTER — Ambulatory Visit
Admission: RE | Admit: 2019-07-22 | Discharge: 2019-07-22 | Disposition: A | Discharge status: Home / Self Care | Payer: Medicare Other | Source: Ambulatory Visit | Attending: Family Medicine | Admitting: Family Medicine

## 2019-07-22 DIAGNOSIS — Z1231 Encounter for screening mammogram for malignant neoplasm of breast: Secondary | ICD-10-CM

## 2019-07-29 DIAGNOSIS — M6281 Muscle weakness (generalized): Secondary | ICD-10-CM | POA: Diagnosis not present

## 2019-08-04 ENCOUNTER — Other Ambulatory Visit: Payer: Self-pay

## 2019-08-04 ENCOUNTER — Encounter: Payer: Self-pay | Admitting: Family Medicine

## 2019-08-04 ENCOUNTER — Ambulatory Visit (INDEPENDENT_AMBULATORY_CARE_PROVIDER_SITE_OTHER): Payer: Medicare Other | Admitting: Family Medicine

## 2019-08-04 VITALS — BP 118/60 | HR 71 | Temp 98.2°F | Ht 62.0 in | Wt 159.1 lb

## 2019-08-04 DIAGNOSIS — G3184 Mild cognitive impairment, so stated: Secondary | ICD-10-CM

## 2019-08-04 DIAGNOSIS — E7800 Pure hypercholesterolemia, unspecified: Secondary | ICD-10-CM | POA: Diagnosis not present

## 2019-08-04 DIAGNOSIS — R413 Other amnesia: Secondary | ICD-10-CM | POA: Diagnosis not present

## 2019-08-04 DIAGNOSIS — L84 Corns and callosities: Secondary | ICD-10-CM | POA: Diagnosis not present

## 2019-08-04 DIAGNOSIS — E038 Other specified hypothyroidism: Secondary | ICD-10-CM

## 2019-08-04 MED ORDER — PRAVASTATIN SODIUM 80 MG PO TABS: 40.0000 mg | ORAL_TABLET | Freq: Every day | ORAL | 1 refills | Status: DC

## 2019-08-04 MED ORDER — QC WOMENS DAILY MULTIVITAMIN PO TABS: 1.0000 | ORAL_TABLET | Freq: Every day | ORAL | Status: DC

## 2019-08-04 NOTE — Progress Notes (Addendum)
Paulet Pinson Cutter DOB: 05-24-1936 Encounter date: 08/04/2019  This is a 83 y.o. female who presents with Chief Complaint  Patient presents with  . patient requests to discuss DNR form    History of present illness: No concerns that M[place]ah has today. Feeling well.  She is here today with her sister, Joaquim Lai (who is her healthcare power of attorney).  Joaquim Lai dropped off a DNR form for me to complete, but was advised that I would need to see them in person to be able to sign this.  She is living in [place] in independent living. Has apartment there. She likes this a lot. Better than living alone. They are running programs there now. She hasn't participated yet in these.   She is here to discuss DNR completed today. She states that she does not want CPR and would like to be a DNR.  She states that especially with memory concerns, she does not want to have CPR or life prolonging measures given, especially when it means that she might be functioning in a decreased mental capacity.  Allergies  Allergen Reactions  . Penicillins Swelling and Rash    ALL CILLINS  . Tetracycline Itching  . Atorvastatin Other (See Comments)    Leg cramps Leg cramps  . Bactrim [Sulfamethoxazole-Trimethoprim] Itching  . Celecoxib Other (See Comments)    Memory disturbance, slowed thinking Memory disturbance, slowed thinking  . Niacin Other (See Comments)    Caused elevated BP  . Niaspan [Niacin Er]     Elevated BP  . Rosuvastatin Other (See Comments)    Elevated CPK  . Simvastatin Other (See Comments)    Leg cramps Leg cramps  . Tetracyclines & Related Itching   Current Meds  Medication Sig  . doxycycline (VIBRAMYCIN) 100 MG capsule Take 100 mg by mouth. As needed prior to dental procedures  . levothyroxine (SYNTHROID, LEVOTHROID) 50 MCG tablet Take 50 mcg by mouth daily before breakfast.  . losartan-hydrochlorothiazide (HYZAAR) 100-12.5 MG tablet Take 1 tablet by mouth daily.  . potassium  chloride (K-DUR) 10 MEQ tablet Take 1 tablet (10 mEq total) by mouth 3 (three) times a week.  . pravastatin (PRAVACHOL) 80 MG tablet Take 0.5 tablets (40 mg total) by mouth daily.  . rivastigmine (EXELON) 1.5 MG capsule Take 1 capsule (1.5 mg total) by mouth 2 (two) times daily.  . valACYclovir (VALTREX) 500 MG tablet TAKE 1 TABLET (500 MG TOTAL) BY MOUTH DAILY. (Patient taking differently: Take 500 mg by mouth daily. )  . [DISCONTINUED] pravastatin (PRAVACHOL) 80 MG tablet Take 1 tablet (80 mg total) by mouth daily.    Review of Systems  Constitutional: Negative for chills, fatigue and fever.  Respiratory: Negative for cough, chest tightness, shortness of breath and wheezing.   Cardiovascular: Negative for chest pain, palpitations and leg swelling.    Objective:  BP 118/60 (BP Location: Left Arm, Patient Position: Sitting, Cuff Size: Normal)   Pulse 71   Temp 98.2 F (36.8 C) (Temporal)   Ht 5\' 2"  (1.575 m)   Wt 159 lb 1.6 oz (72.2 kg)   SpO2 97%   BMI 29.10 kg/m   Weight: 159 lb 1.6 oz (72.2 kg)   BP Readings from Last 3 Encounters:  08/04/19 118/60  05/14/19 100/60  05/06/19 124/62   Wt Readings from Last 3 Encounters:  08/04/19 159 lb 1.6 oz (72.2 kg)  05/14/19 156 lb 14.4 oz (71.2 kg)  04/02/19 157 lb 6.5 oz (71.4 kg)    Physical  Exam Constitutional:      General: She is not in acute distress.    Appearance: She is well-developed.  Cardiovascular:     Rate and Rhythm: Normal rate and regular rhythm.     Heart sounds: Normal heart sounds. No murmur. No friction rub.  Pulmonary:     Effort: Pulmonary effort is normal. No respiratory distress.     Breath sounds: Normal breath sounds. No wheezing or rales.  Musculoskeletal:     Right lower leg: No edema.     Left lower leg: No edema.  Skin:    Comments: There is thickened callus under fifth metatarsal head of the right foot.  Neurological:     Mental Status: She is alert and oriented to person, place, and time.   Psychiatric:        Behavior: Behavior normal.     Assessment/Plan  1. Mild cognitive impairment She is now living in an assisted living facility.  She is very happy here and excited about this new move.  Per her request, a DNR form was signed today and original copy was given to patient/sister.  2. Memory loss She has followed with neurology.  She is currently on rivastigmine.  3. Corn: after foot cleansed with alcohol, corn was pared down for comfort. Discussed wearing shoes with adequate cushion/support as this has formed under metatarsal head. She did have some immediate improvement in comfort.     Return in about 6 months (around 02/02/2020) for Chronic condition visit.    Micheline Rough, MD

## 2019-08-04 NOTE — Patient Instructions (Signed)
Try to find women's vitamin with b12 (500-1016mcg) and vitamin D (1000units).

## 2019-08-29 ENCOUNTER — Other Ambulatory Visit: Payer: Self-pay | Admitting: Family Medicine

## 2019-08-29 DIAGNOSIS — E7800 Pure hypercholesterolemia, unspecified: Secondary | ICD-10-CM

## 2019-08-29 MED ORDER — LEVOTHYROXINE SODIUM 50 MCG PO TABS: 50.0000 ug | ORAL_TABLET | Freq: Every day | ORAL | 1 refills | Status: DC

## 2019-08-29 MED ORDER — VALACYCLOVIR HCL 500 MG PO TABS: ORAL_TABLET | ORAL | 1 refills | Status: DC

## 2019-08-29 MED ORDER — PRAVASTATIN SODIUM 80 MG PO TABS: 40.0000 mg | ORAL_TABLET | Freq: Every day | ORAL | 1 refills | Status: DC

## 2019-08-29 MED ORDER — LOSARTAN POTASSIUM-HCTZ 100-12.5 MG PO TABS: 1.0000 | ORAL_TABLET | Freq: Every day | ORAL | 1 refills | Status: DC

## 2019-08-29 NOTE — Telephone Encounter (Signed)
See refill request.

## 2019-08-29 NOTE — Telephone Encounter (Signed)
Copied from [place] [phone] [phone]. Topic: Quick Communication - Rx Refill/Question >> Aug 29, 2019  1:32 PM Izola Price, [place] A wrote: Medication: losartan-hydrochlorothiazide (HYZAAR) 100-12.5 MG tablet  90 DAY SUPPLY,valACYclovir (VALTREX) 500 MG tablet 90 day supply,pravastatin (PRAVACHOL) 80 MG tablet,levothyroxine (SYNTHROID, LEVOTHROID) 50 MCG tablet    Has the patient contacted their pharmacy? Yes (Agent: If no, request that the patient contact the pharmacy for the refill.) (Agent: If yes, when and what did the pharmacy advise?)Contact PCP  Preferred Pharmacy (with phone number or street name): [place], [place]. [phone] (Phone) [phone] (Fax)    Agent: Please be advised that RX refills may take up to 3 business days. We ask that you follow-up with your pharmacy.

## 2019-08-29 NOTE — Telephone Encounter (Signed)
Requested medication (s) are due for refill today: yes  Requested medication (s) are on the active medication list: yes   Future visit scheduled: no  Notes to clinic:  Review for refill Last filled by different provider    Requested Prescriptions  Pending Prescriptions Disp Refills   levothyroxine (SYNTHROID) 50 MCG tablet      Sig: Take 1 tablet (50 mcg total) by mouth daily before breakfast.     Endocrinology:  Hypothyroid Agents Failed - 08/29/2019  1:42 PM      Failed - TSH needs to be rechecked within 3 months after an abnormal result. Refill until TSH is due.      Passed - TSH in normal range and within 360 days    TSH  Date Value Ref Range Status  06/24/2019 1.41 0.35 - 4.50 uIU/mL Final         Passed - Valid encounter within last 12 months    Recent Outpatient Visits          3 weeks ago Mild cognitive impairment   Therapist, music at [employer], Steele Berg, MD   3 months ago Memory loss   Therapist, music at [employer], Steele Berg, MD   3 months ago Sales executive for Chartered loss adjuster at [employer], Steele Berg, MD   3 months ago Insomnia, unspecified type   Therapist, music at [employer], [place] R, DO   4 months ago Close Exposure to 2019 Novel Administrator at [place], DO      Future Appointments            In 1 month Dorothy Spark, MD [facility] Office, [facility]            valACYclovir (VALTREX) 500 MG tablet 90 tablet 1    Sig: TAKE 1 TABLET (500 MG TOTAL) BY MOUTH DAILY.     Antimicrobials:  Antiviral Agents - Anti-Herpetic Passed - 08/29/2019  1:42 PM      Passed - Valid encounter within last 12 months    Recent Outpatient Visits          3 weeks ago Mild cognitive impairment   Therapist, music at [place], MD   3 months ago Memory loss   Therapist, music at [employer], Steele Berg, MD   3 months ago  Sales executive for Chartered loss adjuster at [employer], Steele Berg, MD   3 months ago Insomnia, unspecified type   Therapist, music at [employer], [place] R, DO   4 months ago Close Exposure to 2019 Novel Administrator at [employer], Nickola Major, DO      Future Appointments            In 1 month [name], Jamse Belfast, MD [facility] [address] Office, [facility]           Signed Prescriptions Disp Refills   losartan-hydrochlorothiazide (HYZAAR) 100-12.5 MG tablet 90 tablet 1    Sig: Take 1 tablet by mouth daily.     Cardiovascular: ARB + Diuretic Combos Passed - 08/29/2019  1:42 PM      Passed - K in normal range and within 180 days    Potassium  Date Value Ref Range Status  06/24/2019 4.1 3.5 - 5.1 mEq/L Final         Passed - Na in normal range and within 180 days    Sodium  Date Value Ref Range Status  06/24/2019 142 135 - 145 mEq/L Final         Passed - Cr in normal range and within 180 days    Creatinine, Ser  Date Value Ref Range Status  06/24/2019 0.80 0.40 - 1.20 mg/dL Final         Passed - Ca in normal range and within 180 days    Calcium  Date Value Ref Range Status  06/24/2019 9.7 8.4 - 10.5 mg/dL Final         Passed - Patient is not pregnant      Passed - Last BP in normal range    BP Readings from Last 1 Encounters:  08/04/19 118/60         Passed - Valid encounter within last 6 months    Recent Outpatient Visits          3 weeks ago Mild cognitive impairment   Therapist, music at [employer], Steele Berg, MD   3 months ago Memory loss   Therapist, music at [employer], Steele Berg, MD   3 months ago Sales executive for Chartered loss adjuster at [employer], Steele Berg, MD   3 months ago Insomnia, unspecified type   Therapist, music at [employer], [place] R, DO   4 months ago Close Exposure to 2019 Novel Administrator at [place], DO      Future Appointments            In 1 month Dorothy Spark, MD [place], [facility]            pravastatin (PRAVACHOL) 80 MG tablet 90 tablet 1    Sig: Take 0.5 tablets (40 mg total) by mouth daily.     Cardiovascular:  Antilipid - Statins Failed - 08/29/2019  1:42 PM      Failed - Total Cholesterol in normal range and within 360 days    Cholesterol  Date Value Ref Range Status  06/24/2019 214 (H) 0 - 200 mg/dL Final    Comment:    ATP III Classification       Desirable:  < 200 mg/dL               Borderline High:  200 - 239 mg/dL          High:  > = 240 mg/dL         Failed - LDL in normal range and within 360 days    LDL Cholesterol  Date Value Ref Range Status  06/24/2019 132 (H) 0 - 99 mg/dL Final         Failed - Triglycerides in normal range and within 360 days    Triglycerides  Date Value Ref Range Status  06/24/2019 187.0 (H) 0.0 - 149.0 mg/dL Final    Comment:    Normal:  <150 mg/dLBorderline High:  150 - 199 mg/dL         Passed - HDL in normal range and within 360 days    HDL  Date Value Ref Range Status  06/24/2019 45.20 >39.00 mg/dL Final         Passed - Patient is not pregnant      Passed - Valid encounter within last 12 months    Recent Outpatient Visits          3 weeks ago Mild cognitive impairment   Therapist, music at [employer], [place],  MD   3 months ago Memory loss   Therapist, music at [employer], Steele Berg, MD   3 months ago Encounter for Chartered loss adjuster at [employer], Steele Berg, MD   3 months ago Insomnia, unspecified type   Therapist, music at [employer], [place], DO   4 months ago Close Exposure to 2019 Novel Administrator at [employer], [employer], DO      Future Appointments            In 1 month [name], Jamse Belfast, MD [place], [facility]

## 2019-09-30 ENCOUNTER — Ambulatory Visit: Payer: Medicare Other | Admitting: Cardiology

## 2019-10-06 ENCOUNTER — Other Ambulatory Visit: Payer: Self-pay | Admitting: Family Medicine

## 2019-11-03 DIAGNOSIS — Z23 Encounter for immunization: Secondary | ICD-10-CM | POA: Diagnosis not present

## 2019-11-03 NOTE — Progress Notes (Signed)
Cardiology Office Note    Date:  11/04/2019   ID:  [id number] [place], [name] 05-29-1936, MRN [id number]  PCP:  Caren Macadam, MD  Cardiologist: Ena Dawley, MD EPS: None  Chief Complaint  Patient presents with  . Follow-up    History of Present Illness:  Rilynne Michener is a 84 y.o. female with history of carotid disease(mild), HTN, HLD, family history of early CAD-brother died MI and CVA, sister MI 23 yo.normal stress test 09/2016.   Last saw Dr. [name] 05/2017 repeat carotids 1-39% bilateral stenosis.  Patient comes in accompanied by her sister. She is in independent living at [facility] but has someone helping with her meds. No chest pain, shortness of breath, edema, dizziness or palpitations. No regular exercise. Says she's going to start.    Past Medical History:  Diagnosis Date  . Aortic atherosclerosis ([place])    a. noted on CT 01/2018.  . Arthritis   . Colonic polyp   . Coronary atherosclerosis    a. noted on CT 01/2018.  Marland Kitchen Genital herpes   . Hyperlipidemia   . Hypertension   . Mild carotid artery disease ([place])   . Osteoporosis   . Pulmonary nodules    a. folloewd by PCP by serial Ct.  . Thyroid disease    seeing Dr. Buddy Duty  . Tremor     Past Surgical History:  Procedure Laterality Date  . ABDOMINAL HYSTERECTOMY  1988   no cancer - reports total with bilat oophorectomy  . APPENDECTOMY  1988  . BREAST CYST EXCISION Bilateral   . BREAST SURGERY  1985   biopsy  . FOOT SURGERY     multiple sugeries, remote  . TOTAL HIP ARTHROPLASTY Bilateral     Current Medications: Current Meds  Medication Sig  . doxycycline (VIBRAMYCIN) 100 MG capsule Take 100 mg by mouth. As needed prior to dental procedures  . levothyroxine (SYNTHROID) 50 MCG tablet Take 1 tablet (50 mcg total) by mouth daily before breakfast.  . losartan-hydrochlorothiazide (HYZAAR) 100-12.5 MG tablet TAKE 1 TABLET BY MOUTH EVERY DAY  . Multiple Vitamins-Minerals (QC WOMENS DAILY  MULTIVITAMIN) TABS Take 1 tablet by mouth daily.  . potassium chloride (K-DUR) 10 MEQ tablet Take 1 tablet (10 mEq total) by mouth 3 (three) times a week.  . pravastatin (PRAVACHOL) 80 MG tablet Take 0.5 tablets (40 mg total) by mouth daily.  . rivastigmine (EXELON) 1.5 MG capsule Take 1 capsule (1.5 mg total) by mouth 2 (two) times daily.  . valACYclovir (VALTREX) 500 MG tablet TAKE 1 TABLET (500 MG TOTAL) BY MOUTH DAILY.     Allergies:   Penicillins, Tetracycline, Atorvastatin, Bactrim [sulfamethoxazole-trimethoprim], Celecoxib, Niacin, Niaspan [niacin er], Rosuvastatin, Simvastatin, and Tetracyclines & related   Social History   Socioeconomic History  . Marital status: Widowed    Spouse name: Not on file  . Number of children: Not on file  . Years of education: Not on file  . Highest education level: Not on file  Occupational History  . Not on file  Tobacco Use  . Smoking status: Former Smoker    Quit date: 10/16/1978    Years since quitting: 41.0  . Smokeless tobacco: Never Used  . Tobacco comment: 1963 when her father died;  smoked for a brief period of time  Substance and Sexual Activity  . Alcohol use: No    Alcohol/week: 0.0 standard drinks  . Drug use: No  . Sexual activity: Not on file  Other Topics Concern  .  Not on file  Social History Narrative   Work or School: none      Home Situation: widower, lives alone      Spiritual Beliefs: Christian      Lifestyle: no regular exercise; diet is ok      Social Determinants of Radio broadcast assistant Strain:   . Difficulty of Paying Living Expenses: Not on file  Food Insecurity:   . Worried About Charity fundraiser in the Last Year: Not on file  . Ran Out of Food in the Last Year: Not on file  Transportation Needs:   . Lack of Transportation (Medical): Not on file  . Lack of Transportation (Non-Medical): Not on file  Physical Activity:   . Days of Exercise per Week: Not on file  . Minutes of Exercise per  Session: Not on file  Stress:   . Feeling of Stress : Not on file  Social Connections:   . Frequency of Communication with Friends and Family: Not on file  . Frequency of Social Gatherings with Friends and Family: Not on file  . Attends Religious Services: Not on file  . Active Member of Clubs or Organizations: Not on file  . Attends Archivist Meetings: Not on file  . Marital Status: Not on file     Family History:  The patient's   family history includes Arthritis in her mother; Diabetes in her mother; Heart attack in her brother; Heart attack (age of onset: 19) in her sister; Heart disease in her father, maternal grandfather, and mother; Hyperlipidemia in her mother; Hypertension in her mother; Liver disease in her brother; Pancreatic cancer in her father.   ROS:   Please see the history of present illness.    ROS All other systems reviewed and are negative.   PHYSICAL EXAM:   VS:  BP 112/60   Pulse 78   Ht 5\' 2"  (1.575 m)   Wt 163 lb (73.9 kg)   SpO2 95%   BMI 29.81 kg/m   Physical Exam  GEN: Well nourished, well developed, in no acute distress   Neck: no JVD, carotid bruits, or masses Cardiac:RRR; no murmurs, rubs, or gallops  Respiratory:  clear to auscultation bilaterally, normal work of breathing GI: soft, nontender, nondistended, + BS Ext: without cyanosis, clubbing, or edema, Good distal pulses bilaterally Neuro:  Alert and Oriented x 3 Psych: euthymic mood, full affect  Wt Readings from Last 3 Encounters:  11/04/19 163 lb (73.9 kg)  08/04/19 159 lb 1.6 oz (72.2 kg)  05/14/19 156 lb 14.4 oz (71.2 kg)      Studies/Labs Reviewed:   EKG:  EKG is not  ordered today.   Recent Labs: 04/02/2019: Magnesium 1.7 04/04/2019: Hemoglobin 12.4; Platelets 285 06/24/2019: ALT 41; BUN 19; Creatinine, Ser 0.80; Potassium 4.1; Sodium 142; TSH 1.41   Lipid Panel    Component Value Date/Time   CHOL 214 (H) 06/24/2019 1112   TRIG 187.0 (H) 06/24/2019 1112   HDL  45.20 06/24/2019 1112   CHOLHDL 5 06/24/2019 1112   VLDL 37.4 06/24/2019 1112   LDLCALC 132 (H) 06/24/2019 1112    Additional studies/ records that were reviewed today include:   Carotid dopplers 2018 Final Interpretation: Right Carotid: There is evidence in the right ICA of a 1-39% stenosis. The                extracranial vessels were near-normal with only minimal wall  thickening or plaque. There appears to be a multinodular goiter                of the right thyroid, largest measuring 2.1 x 2.1 x 2.4 cm.   Left Carotid: There is evidence in the left ICA of a 1-39% stenosis.   Vertebrals:  Both vertebral arteries were patent with antegrade flow. Subclavians: Normal flow hemodynamics were seen in bilateral subclavian              arteries.    NST 2017Study Highlights     Nuclear stress EF: 61%.  There was no ST segment deviation noted during stress.  Blood pressure demonstrated a normal response to exercise.  The study is normal.  This is a low risk study.  The left ventricular ejection fraction is normal (55-65%).   Normal exercise nuclear stress test with no evidence of prior infarct or ischemia.  Normal exercise capacity. Normal BP response to stress.       ASSESSMENT:    1. Essential hypertension   2. Hyperlipidemia, unspecified hyperlipidemia type   3. Family history of early CAD   60. Stenosis of both internal carotid arteries      PLAN:  In order of problems listed above:  HTN BP well controlled on Hyzaar  HLD LDL 132 on pravachol 40 mg. Will increase to 80 mg daily and repeat labs in 3 months  Family history of early CAD recommend 20-30 min exercise daily  Carotid stenosis-1-39% bilateral 2018-will repeat dopplers    Medication Adjustments/Labs and Tests Ordered: Current medicines are reviewed at length with the patient today.  Concerns regarding medicines are outlined above.  Medication changes, Labs and Tests ordered today  are listed in the Patient Instructions below. There are no Patient Instructions on file for this visit.   Signed, Ermalinda Barrios, PA-C  11/04/2019 1:54 PM    [facility] [address], [place], [place]  [address] Phone: [phone]; Fax: [phone]

## 2019-11-04 ENCOUNTER — Telehealth: Payer: Self-pay | Admitting: Physician Assistant

## 2019-11-04 ENCOUNTER — Ambulatory Visit (INDEPENDENT_AMBULATORY_CARE_PROVIDER_SITE_OTHER): Payer: Medicare Other | Admitting: Physician Assistant

## 2019-11-04 ENCOUNTER — Encounter: Payer: Self-pay | Admitting: Physician Assistant

## 2019-11-04 ENCOUNTER — Other Ambulatory Visit: Payer: Self-pay

## 2019-11-04 VITALS — BP 112/60 | HR 78 | Ht 62.0 in | Wt 163.0 lb

## 2019-11-04 DIAGNOSIS — Z8249 Family history of ischemic heart disease and other diseases of the circulatory system: Secondary | ICD-10-CM | POA: Diagnosis not present

## 2019-11-04 DIAGNOSIS — E7800 Pure hypercholesterolemia, unspecified: Secondary | ICD-10-CM

## 2019-11-04 DIAGNOSIS — E785 Hyperlipidemia, unspecified: Secondary | ICD-10-CM | POA: Diagnosis not present

## 2019-11-04 DIAGNOSIS — I6523 Occlusion and stenosis of bilateral carotid arteries: Secondary | ICD-10-CM

## 2019-11-04 DIAGNOSIS — I10 Essential (primary) hypertension: Secondary | ICD-10-CM

## 2019-11-04 MED ORDER — PRAVASTATIN SODIUM 80 MG PO TABS: 80.0000 mg | ORAL_TABLET | Freq: Every day | ORAL | 3 refills | Status: DC

## 2019-11-04 NOTE — Telephone Encounter (Signed)
PT HAS MEMORY ISSUES AND SISTER DID ACCOMPANY PT TO APPT .Adonis Housekeeper

## 2019-11-04 NOTE — Patient Instructions (Signed)
Your physician has recommended you make the following change in your medication: INCREASE PRAVASTATIN TO 80 MG EVERY DAY   Your physician recommends that you return for lab work in:3 [place] physician has requested that you have a carotid duplex. This test is an ultrasound of the carotid arteries in your neck. It looks at blood flow through these arteries that supply the brain with blood. Allow one hour for this exam. There are no restrictions or special instructions. 3 MONTHS  SCHEDULE EARLY SO PT MAY HAVE FASTING LABS THE SAME DAY      Your physician wants you to follow-up in: [place] will receive a reminder letter in the mail two months in advance. If you don't receive a letter, please call our office to schedule the follow-up appointment.

## 2019-11-04 NOTE — Telephone Encounter (Signed)
New message   Patient's sister will be coming back with the patient to the visit with Ermalinda Barrios. The patient has memory problems.

## 2019-11-11 DIAGNOSIS — H6123 Impacted cerumen, bilateral: Secondary | ICD-10-CM | POA: Diagnosis not present

## 2019-11-28 ENCOUNTER — Telehealth: Payer: Self-pay | Admitting: Family Medicine

## 2019-11-28 MED ORDER — LOSARTAN POTASSIUM-HCTZ 100-12.5 MG PO TABS: 1.0000 | ORAL_TABLET | Freq: Every day | ORAL | 0 refills | Status: DC

## 2019-11-28 NOTE — Telephone Encounter (Signed)
Rx done. 

## 2019-11-28 NOTE — Telephone Encounter (Signed)
Medication Refill: Losartan  This was refilled at the wrong place  Pharmacy: [facility] Fax: [phone]

## 2019-12-01 DIAGNOSIS — Z23 Encounter for immunization: Secondary | ICD-10-CM | POA: Diagnosis not present

## 2019-12-09 ENCOUNTER — Ambulatory Visit: Payer: Medicare Other | Admitting: Neurology

## 2019-12-18 ENCOUNTER — Other Ambulatory Visit: Payer: Self-pay | Admitting: Neurology

## 2019-12-18 ENCOUNTER — Other Ambulatory Visit: Payer: Self-pay | Admitting: Family Medicine

## 2019-12-18 DIAGNOSIS — E876 Hypokalemia: Secondary | ICD-10-CM

## 2019-12-31 ENCOUNTER — Telehealth: Payer: Self-pay | Admitting: Family Medicine

## 2019-12-31 NOTE — Telephone Encounter (Signed)
I left a detailed message with the information requested below and asked for the pts sister Mrs Loma Sousa to return my call as this info cannot be given without a signed release.

## 2019-12-31 NOTE — Telephone Encounter (Signed)
Jared from [facility] is in charge of making sure the pt receives her mediations and go over her pill box. He received 2 list from pt's sister and it does not match so he wants to confirm with the [place] or if we could fax over her medication list with the dosages so he can make sure the pt is taking what she is needing.   Kevan Ny can be reached at [place]: [phone]

## 2020-01-09 ENCOUNTER — Telehealth: Payer: Self-pay | Admitting: Family Medicine

## 2020-01-09 NOTE — Telephone Encounter (Signed)
Pt's sister, Joaquim Lai, is requesting to speak to you and get some advice on pt's supplements that pt is currently taking. Per Joaquim Lai, they have switched pharmacies and are asking, her questions that she does not know how to answer. Pt did not give me the name of medications and said, she would go over them with you. Thanks

## 2020-01-12 NOTE — Telephone Encounter (Signed)
Left a message for the pt's sister to return my call.  

## 2020-01-13 NOTE — Telephone Encounter (Signed)
Mrs Felicia Kelly called back and stated she questioned if the pt should be on vitamin C and vitamin E as the pt wanted to take these and if so how much?  Also wanted to know how much magnesium the pt needed to take with the calcium also as I advised calcium 1200mg  daily from food or a supplement per bone density from 2017.  Message sent to PCP and Mrs Felicia Kelly is aware a message will be left on her voicemail next week when Dr Ethlyn Gallery returns to the office.

## 2020-01-14 DIAGNOSIS — H35363 Drusen (degenerative) of macula, bilateral: Secondary | ICD-10-CM | POA: Diagnosis not present

## 2020-01-14 DIAGNOSIS — H35033 Hypertensive retinopathy, bilateral: Secondary | ICD-10-CM | POA: Diagnosis not present

## 2020-01-14 DIAGNOSIS — Z961 Presence of intraocular lens: Secondary | ICD-10-CM | POA: Diagnosis not present

## 2020-01-15 ENCOUNTER — Other Ambulatory Visit: Payer: Self-pay | Admitting: Neurology

## 2020-01-19 NOTE — Telephone Encounter (Signed)
I tend to err on the side of simplicity with supplements and would suggest we limit unless we can prove some benefit.   *I do not feel she needs to be on vitamin C or E. We had previously documented a multivitamin for her. And usually this will provide 100% of daily needs of these vitamins so could just simplify and do a multivitamin if desired.  *the vitamin D is not always sufficient in multivitamin so I would suggest looking for MVI with 10-1998 units vitamin D or considering supplementing this.   *there would be some magnesium in the multivitamin and I wouldn't think she would need extra unless deficient in the past. If she has been taking in past (we didn't have this on med list) then she could continue but I would recommend 280mcg or less daily.   *I agree with calcium recommendation. If getting 3 servings calcium/day in diet; no need to supplement.

## 2020-01-19 NOTE — Telephone Encounter (Signed)
I left a detailed message on the voicemail for Mrs Felicia Kelly with the info as below.

## 2020-01-21 DIAGNOSIS — Z20828 Contact with and (suspected) exposure to other viral communicable diseases: Secondary | ICD-10-CM | POA: Diagnosis not present

## 2020-01-21 DIAGNOSIS — Z1159 Encounter for screening for other viral diseases: Secondary | ICD-10-CM | POA: Diagnosis not present

## 2020-01-26 ENCOUNTER — Telehealth: Payer: Self-pay | Admitting: Neurology

## 2020-01-26 ENCOUNTER — Other Ambulatory Visit: Payer: Self-pay

## 2020-01-26 MED ORDER — RIVASTIGMINE TARTRATE 1.5 MG PO CAPS: ORAL_CAPSULE | ORAL | 0 refills | Status: DC

## 2020-01-26 NOTE — Telephone Encounter (Signed)
Patient's sister called and scheduled a follow up appointment on 03/19/20 at 3:30 PM with Dr. Delice Lesch.   She is almost completely out of her rivastigmine 1.5 MG capsule. Patient is requesting refills to last until her next appointment.  [place]  Please call back with an update.

## 2020-01-26 NOTE — Telephone Encounter (Signed)
Pt sister called informed that medication refill was sent in to [facility]

## 2020-01-28 DIAGNOSIS — Z1159 Encounter for screening for other viral diseases: Secondary | ICD-10-CM | POA: Diagnosis not present

## 2020-01-28 DIAGNOSIS — Z20828 Contact with and (suspected) exposure to other viral communicable diseases: Secondary | ICD-10-CM | POA: Diagnosis not present

## 2020-02-02 ENCOUNTER — Ambulatory Visit (HOSPITAL_COMMUNITY)
Admission: RE | Admit: 2020-02-02 | Discharge: 2020-02-02 | Disposition: A | Discharge status: Home / Self Care | Payer: Medicare Other | Source: Ambulatory Visit | Attending: Cardiovascular Disease | Admitting: Cardiovascular Disease

## 2020-02-02 ENCOUNTER — Other Ambulatory Visit: Payer: Self-pay

## 2020-02-02 DIAGNOSIS — I6523 Occlusion and stenosis of bilateral carotid arteries: Secondary | ICD-10-CM | POA: Diagnosis not present

## 2020-02-04 DIAGNOSIS — Z1159 Encounter for screening for other viral diseases: Secondary | ICD-10-CM | POA: Diagnosis not present

## 2020-02-04 DIAGNOSIS — Z20828 Contact with and (suspected) exposure to other viral communicable diseases: Secondary | ICD-10-CM | POA: Diagnosis not present

## 2020-02-05 ENCOUNTER — Other Ambulatory Visit: Payer: Self-pay

## 2020-02-05 ENCOUNTER — Other Ambulatory Visit: Payer: Medicare Other | Admitting: *Deleted

## 2020-02-05 DIAGNOSIS — E785 Hyperlipidemia, unspecified: Secondary | ICD-10-CM | POA: Diagnosis not present

## 2020-02-05 DIAGNOSIS — Z8249 Family history of ischemic heart disease and other diseases of the circulatory system: Secondary | ICD-10-CM

## 2020-02-05 LAB — HEPATIC FUNCTION PANEL
ALT: 59 IU/L — ABNORMAL HIGH (ref 0–32)
AST: 51 IU/L — ABNORMAL HIGH (ref 0–40)
Albumin: 4.2 g/dL (ref 3.6–4.6)
Alkaline Phosphatase: 90 IU/L (ref 39–117)
Bilirubin Total: 0.3 mg/dL (ref 0.0–1.2)
Bilirubin, Direct: 0.1 mg/dL (ref 0.00–0.40)
Total Protein: 6.9 g/dL (ref 6.0–8.5)

## 2020-02-05 LAB — LIPID PANEL
Chol/HDL Ratio: 3.7 ratio (ref 0.0–4.4)
Cholesterol, Total: 168 mg/dL (ref 100–199)
HDL: 46 mg/dL (ref >39)
LDL Chol Calc (NIH): 95 mg/dL (ref 0–99)
Triglycerides: 155 mg/dL — ABNORMAL HIGH (ref 0–149)
VLDL Cholesterol Cal: 27 mg/dL (ref 5–40)

## 2020-02-06 ENCOUNTER — Other Ambulatory Visit: Payer: Self-pay

## 2020-02-06 DIAGNOSIS — E7800 Pure hypercholesterolemia, unspecified: Secondary | ICD-10-CM

## 2020-02-06 MED ORDER — PRAVASTATIN SODIUM 40 MG PO TABS: 40.0000 mg | ORAL_TABLET | Freq: Every day | ORAL | 3 refills | Status: DC

## 2020-02-11 DIAGNOSIS — Z20828 Contact with and (suspected) exposure to other viral communicable diseases: Secondary | ICD-10-CM | POA: Diagnosis not present

## 2020-02-11 DIAGNOSIS — Z1159 Encounter for screening for other viral diseases: Secondary | ICD-10-CM | POA: Diagnosis not present

## 2020-02-18 ENCOUNTER — Encounter (HOSPITAL_COMMUNITY): Payer: Self-pay | Admitting: Emergency Medicine

## 2020-02-18 ENCOUNTER — Emergency Department (HOSPITAL_COMMUNITY): Payer: Medicare Other

## 2020-02-18 ENCOUNTER — Other Ambulatory Visit: Payer: Self-pay

## 2020-02-18 ENCOUNTER — Emergency Department (HOSPITAL_COMMUNITY)
Admission: EM | Admit: 2020-02-18 | Discharge: 2020-02-19 | Disposition: A | Discharge status: Home / Self Care | Payer: Medicare Other | Attending: Emergency Medicine | Admitting: Emergency Medicine

## 2020-02-18 DIAGNOSIS — I629 Nontraumatic intracranial hemorrhage, unspecified: Secondary | ICD-10-CM

## 2020-02-18 DIAGNOSIS — S065X0A Traumatic subdural hemorrhage without loss of consciousness, initial encounter: Secondary | ICD-10-CM | POA: Insufficient documentation

## 2020-02-18 DIAGNOSIS — S065X9A Traumatic subdural hemorrhage with loss of consciousness of unspecified duration, initial encounter: Secondary | ICD-10-CM

## 2020-02-18 DIAGNOSIS — Z87891 Personal history of nicotine dependence: Secondary | ICD-10-CM | POA: Insufficient documentation

## 2020-02-18 DIAGNOSIS — F0390 Unspecified dementia without behavioral disturbance: Secondary | ICD-10-CM | POA: Diagnosis not present

## 2020-02-18 DIAGNOSIS — S0990XA Unspecified injury of head, initial encounter: Secondary | ICD-10-CM | POA: Diagnosis present

## 2020-02-18 DIAGNOSIS — I10 Essential (primary) hypertension: Secondary | ICD-10-CM | POA: Insufficient documentation

## 2020-02-18 DIAGNOSIS — Y9289 Other specified places as the place of occurrence of the external cause: Secondary | ICD-10-CM | POA: Insufficient documentation

## 2020-02-18 DIAGNOSIS — Y9389 Activity, other specified: Secondary | ICD-10-CM | POA: Diagnosis not present

## 2020-02-18 DIAGNOSIS — W19XXXA Unspecified fall, initial encounter: Secondary | ICD-10-CM | POA: Insufficient documentation

## 2020-02-18 DIAGNOSIS — Z23 Encounter for immunization: Secondary | ICD-10-CM | POA: Insufficient documentation

## 2020-02-18 DIAGNOSIS — E039 Hypothyroidism, unspecified: Secondary | ICD-10-CM | POA: Diagnosis not present

## 2020-02-18 DIAGNOSIS — S06350A Traumatic hemorrhage of left cerebrum without loss of consciousness, initial encounter: Secondary | ICD-10-CM | POA: Diagnosis not present

## 2020-02-18 DIAGNOSIS — Z79899 Other long term (current) drug therapy: Secondary | ICD-10-CM | POA: Diagnosis not present

## 2020-02-18 DIAGNOSIS — Z20828 Contact with and (suspected) exposure to other viral communicable diseases: Secondary | ICD-10-CM | POA: Diagnosis not present

## 2020-02-18 DIAGNOSIS — Y999 Unspecified external cause status: Secondary | ICD-10-CM | POA: Diagnosis not present

## 2020-02-18 DIAGNOSIS — Z1159 Encounter for screening for other viral diseases: Secondary | ICD-10-CM | POA: Diagnosis not present

## 2020-02-18 DIAGNOSIS — S0003XA Contusion of scalp, initial encounter: Secondary | ICD-10-CM | POA: Diagnosis not present

## 2020-02-18 DIAGNOSIS — R52 Pain, unspecified: Secondary | ICD-10-CM | POA: Diagnosis not present

## 2020-02-18 DIAGNOSIS — S199XXA Unspecified injury of neck, initial encounter: Secondary | ICD-10-CM | POA: Diagnosis not present

## 2020-02-18 DIAGNOSIS — S065XAA Traumatic subdural hemorrhage with loss of consciousness status unknown, initial encounter: Secondary | ICD-10-CM

## 2020-02-18 LAB — CBC WITH DIFFERENTIAL/PLATELET
Abs Immature Granulocytes: 0.04 10*3/uL (ref 0.00–0.07)
Basophils Absolute: 0 10*3/uL (ref 0.0–0.1)
Basophils Relative: 0 %
Eosinophils Absolute: 0.2 10*3/uL (ref 0.0–0.5)
Eosinophils Relative: 2 %
HCT: 39.7 % (ref 36.0–46.0)
Hemoglobin: 13.3 g/dL (ref 12.0–15.0)
Immature Granulocytes: 0 %
Lymphocytes Relative: 16 %
Lymphs Abs: 1.9 10*3/uL (ref 0.7–4.0)
MCH: 30.9 pg (ref 26.0–34.0)
MCHC: 33.5 g/dL (ref 30.0–36.0)
MCV: 92.3 fL (ref 80.0–100.0)
Monocytes Absolute: 0.8 10*3/uL (ref 0.1–1.0)
Monocytes Relative: 7 %
Neutro Abs: 8.7 10*3/uL — ABNORMAL HIGH (ref 1.7–7.7)
Neutrophils Relative %: 75 %
Platelets: 258 10*3/uL (ref 150–400)
RBC: 4.3 MIL/uL (ref 3.87–5.11)
RDW: 13.4 % (ref 11.5–15.5)
WBC: 11.6 10*3/uL — ABNORMAL HIGH (ref 4.0–10.5)
nRBC: 0 % (ref 0.0–0.2)

## 2020-02-18 LAB — BASIC METABOLIC PANEL
Anion gap: 10 (ref 5–15)
BUN: 20 mg/dL (ref 8–23)
CO2: 26 mmol/L (ref 22–32)
Calcium: 9.5 mg/dL (ref 8.9–10.3)
Chloride: 107 mmol/L (ref 98–111)
Creatinine, Ser: 0.89 mg/dL (ref 0.44–1.00)
GFR calc Af Amer: >60 mL/min (ref >60)
GFR calc non Af Amer: >60 mL/min (ref >60)
Glucose, Bld: 162 mg/dL — ABNORMAL HIGH (ref 70–99)
Potassium: 3.4 mmol/L — ABNORMAL LOW (ref 3.5–5.1)
Sodium: 143 mmol/L (ref 135–145)

## 2020-02-18 MED ORDER — ACETAMINOPHEN 500 MG PO TABS
1000.0000 mg | ORAL_TABLET | Freq: Once | ORAL | Status: AC
  Administered 2020-02-18: 1000 mg via ORAL
  Filled 2020-02-18: qty 2

## 2020-02-18 NOTE — ED Provider Notes (Signed)
[facility] DEPT Provider Note   CSN: [id number] Arrival date & time: 02/18/20  1820     History Chief Complaint  Patient presents with  . Fall  . Head Injury    [place] is a 84 y.o. female.  The history is provided by the patient.  Fall This is a new problem. The current episode started less than 1 hour ago. The problem has been resolved. Associated symptoms include headaches. Pertinent negatives include no chest pain, no abdominal pain and no shortness of breath. Nothing aggravates the symptoms. Nothing relieves the symptoms. She has tried nothing for the symptoms. The treatment provided no relief.       Past Medical History:  Diagnosis Date  . Aortic atherosclerosis ([place])    a. noted on CT 01/2018.  . Arthritis   . Colonic polyp   . Coronary atherosclerosis    a. noted on CT 01/2018.  Marland Kitchen Genital herpes   . Hyperlipidemia   . Hypertension   . Mild carotid artery disease ([place])   . Osteoporosis   . Pulmonary nodules    a. folloewd by PCP by serial Ct.  . Thyroid disease    seeing Dr. Buddy Duty  . Tremor     Patient Active Problem List   Diagnosis Date Noted  . Acute encephalopathy 04/02/2019  . Overdose of cardiac medication   . Bradycardia 04/01/2019  . Cystocele 02/13/2019  . Facial pain 02/13/2019  . Hip pain 02/13/2019  . Osteoarthritis of hip 02/13/2019  . Subchondral cysts 02/13/2019  . Vitamin D deficiency 02/13/2019  . Aortic atherosclerosis ([place]) 10/14/2017  . Solitary pulmonary nodule 10/23/2016  . Hyperglycemia 07/11/2016  . Hypercholesterolemia 06/04/2013  . Hypothyroidism 06/04/2013  . Genital herpes 06/04/2013  . Osteopenia 06/27/2012  . Malignant basal cell neoplasm of skin 02/14/2012  . Benign essential hypertension 11/22/2011  . Internal carotid artery stenosis 09/18/2011  . Thumb pain 09/13/2011  . Multinodular goiter 10/16/2009  . Herpes simplex type 2 infection 10/05/2008  . Single renal cyst  02/14/2008    Past Surgical History:  Procedure Laterality Date  . ABDOMINAL HYSTERECTOMY  1988   no cancer - reports total with bilat oophorectomy  . APPENDECTOMY  1988  . BREAST CYST EXCISION Bilateral   . BREAST SURGERY  1985   biopsy  . FOOT SURGERY     multiple sugeries, remote  . TOTAL HIP ARTHROPLASTY Bilateral      OB History   No obstetric history on file.     Family History  Problem Relation Age of Onset  . Pancreatic cancer Father   . Heart disease Father   . Heart attack Sister 65  . Arthritis Mother   . Hyperlipidemia Mother   . Hypertension Mother   . Heart disease Mother   . Diabetes Mother   . Liver disease Brother        cancer of bile duct  . Heart disease Maternal Grandfather   . Heart attack Brother   . Breast cancer Neg Hx     Social History   Tobacco Use  . Smoking status: Former Smoker    Quit date: 10/16/1978    Years since quitting: 41.3  . Smokeless tobacco: Never Used  . Tobacco comment: 1963 when her father died;  smoked for a brief period of time  Substance Use Topics  . Alcohol use: No    Alcohol/week: 0.0 standard drinks  . Drug use: No    Home Medications Prior  to Admission medications   Medication Sig Start Date End Date Taking? Authorizing Provider  doxycycline (VIBRAMYCIN) 100 MG capsule Take 100 mg by mouth. As needed prior to dental procedures    [provider]  levothyroxine (SYNTHROID) 50 MCG tablet Take 1 tablet (50 mcg total) by mouth daily before breakfast. 08/29/19   Koberlein, Steele Berg, MD  losartan-hydrochlorothiazide (HYZAAR) 100-12.5 MG tablet Take 1 tablet by mouth daily. 11/28/19   Caren Macadam, MD  Multiple Vitamins-Minerals (QC WOMENS DAILY MULTIVITAMIN) TABS Take 1 tablet by mouth daily. 08/04/19   Koberlein, Steele Berg, MD  potassium chloride (KLOR-CON) 10 MEQ tablet TAKE 1 TABLET THREE TIMES WEEKLY. 12/18/19   Caren Macadam, MD  pravastatin (PRAVACHOL) 40 MG tablet Take 1 tablet (40 mg  total) by mouth daily. 02/06/20   Imogene Burn, PA-C  rivastigmine (EXELON) 1.5 MG capsule TAKE (1) CAPSULE TWICE DAILY. 01/26/20   Cameron Sprang, MD  valACYclovir (VALTREX) 500 MG tablet TAKE 1 TABLET (500 MG TOTAL) BY MOUTH DAILY. 08/29/19   Caren Macadam, MD    Allergies    Penicillins, Tetracycline, Atorvastatin, Bactrim [sulfamethoxazole-trimethoprim], Celecoxib, Niacin, Niaspan [niacin er], Rosuvastatin, Simvastatin, and Tetracyclines & related  Review of Systems   Review of Systems  Constitutional: Negative for chills and fever.  HENT: Negative for ear pain and sore throat.   Eyes: Negative for pain and visual disturbance.  Respiratory: Negative for cough and shortness of breath.   Cardiovascular: Negative for chest pain and palpitations.  Gastrointestinal: Negative for abdominal pain and vomiting.  Genitourinary: Negative for dysuria and hematuria.  Musculoskeletal: Negative for arthralgias, back pain, gait problem, joint swelling, myalgias and neck pain.  Skin: Negative for color change and rash.  Neurological: Positive for headaches. Negative for dizziness, tremors, seizures, syncope, facial asymmetry, speech difficulty, weakness, light-headedness and numbness.  All other systems reviewed and are negative.   Physical Exam Updated Vital Signs BP 139/79 (BP Location: Left Arm)   Pulse 87   Temp 98.8 F (37.1 C) (Oral)   Resp 18   Ht 5\' 2"  (1.575 m)   Wt 73.9 kg   SpO2 98%   BMI 29.80 kg/m   Physical Exam Vitals and nursing note reviewed.  Constitutional:      General: She is not in acute distress.    Appearance: She is well-developed. She is not ill-appearing.  HENT:     Head:     Comments: Hematoma to occiput, abrasion to area but not laceration  Eyes:     Extraocular Movements: Extraocular movements intact.     Conjunctiva/sclera: Conjunctivae normal.     Pupils: Pupils are equal, round, and reactive to light.  Cardiovascular:     Rate and Rhythm:  Normal rate and regular rhythm.     Heart sounds: No murmur.  Pulmonary:     Effort: Pulmonary effort is normal. No respiratory distress.     Breath sounds: Normal breath sounds.  Abdominal:     Palpations: Abdomen is soft.     Tenderness: There is no abdominal tenderness.  Musculoskeletal:        General: No tenderness. Normal range of motion.     Cervical back: Normal range of motion and neck supple. No tenderness.     Comments: No midline spinal tenderness  Skin:    General: Skin is warm and dry.  Neurological:     General: No focal deficit present.     Mental Status: She is alert.  Cranial Nerves: No cranial nerve deficit.     Sensory: No sensory deficit.     Motor: No weakness.     Comments: 5+ out of 5 strength throughout, normal sensation, no drift, normal finger-to-nose finger     ED Results / Procedures / Treatments   Labs (all labs ordered are listed, but only abnormal results are displayed) Labs Reviewed  CBC WITH DIFFERENTIAL/PLATELET - Abnormal; Notable for the following components:      Result Value   WBC 11.6 (*)    Neutro Abs 8.7 (*)    All other components within normal limits  BASIC METABOLIC PANEL - Abnormal; Notable for the following components:   Potassium 3.4 (*)    Glucose, Bld 162 (*)    All other components within normal limits    EKG None  Radiology CT Head Wo Contrast  Result Date: 02/18/2020 CLINICAL DATA:  Head trauma, headache. Spine fracture, cervical, traumatic. Additional history provided: Fall, posterior head injury. EXAM: CT HEAD WITHOUT CONTRAST CT CERVICAL SPINE WITHOUT CONTRAST TECHNIQUE: Multidetector CT imaging of the head and cervical spine was performed following the standard protocol without intravenous contrast. Multiplanar CT image reconstructions of the cervical spine were also generated. COMPARISON:  Head CT 05/06/2019, CT cervical spine 05/06/2019, CT chest 02/11/2018, thyroid ultrasound 12/20/2017. FINDINGS: CT HEAD  FINDINGS Brain: There is a small amount of acute extra-axial hemorrhage along the left aspect of the anterior falx which may be subdural or subarachnoid (series 3, images 14-16). There is stable moderate ill-defined hypoattenuation within the cerebral white matter which is nonspecific, but consistent with chronic small vessel ischemic disease. Unchanged mild generalized parenchymal atrophy. No demarcated cortical infarct. No evidence of intracranial mass. No midline shift. Vascular: No hyperdense vessel.  Atherosclerotic calcifications. Skull: Normal. Negative for fracture or focal lesion. Sinuses/Orbits: Visualized orbits show no acute finding. Mild ethmoid sinus mucosal thickening. No significant mastoid effusion. Other: Large midline posterior scalp hematoma. CT CERVICAL SPINE FINDINGS Alignment: Straightening of the expected cervical lordosis. Trace C7-T1 anterolisthesis. Skull base and vertebrae: The basion-dental and atlanto-dental intervals are maintained.No evidence of acute fracture to the cervical spine. 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 There is also fusion of the C2-C3 posterior elements. Soft tissues and spinal canal: No prevertebral soft tissue swelling or visible canal hematoma. Disc levels: Cervical spondylosis with multilevel disc space narrowing, posterior disc osteophytes and uncovertebral hypertrophy. Upper chest: No consolidation within the imaged lung apices. No visible pneumothorax. Other: There has been interval increase in size of an exophytic right thyroid lesion, now 3.1 cm (series 4, image 66). This measured 2.4 cm on prior chest CT 02/11/2018. This finding was evaluated on dedicated thyroid ultrasound 12/20/2017 and strongly favored to reflect a pseudonodule in the setting of goiter. Please refer to this prior report for further description. These results were called by telephone at the time of interpretation on 02/18/2020 at 7:42 pm to  provider Natilee Gauer , who verbally acknowledged these results. IMPRESSION: CT head: 1. Small volume acute extra-axial hemorrhage along the left aspect of the anterior falx which may be subdural or subarachnoid. 2. Large posterior midline scalp hematoma. 3. Stable generalized atrophy of the brain and chronic small vessel ischemic disease. 4. Mild ethmoid sinus mucosal thickening. CT cervical spine: 1. No evidence of acute fracture to the cervical spine. 2. Redemonstrated C2-C3 and C3-C4 fusion. 3. Minimal C7-T1 grade 1 anterolisthesis, nonspecific but may be degenerative. 4. A 3 cm exophytic lesion arising from the  right thyroid lobe has increased in size since chest CT 02/11/2018 (previously 2.4 cm). However, this finding was previously assessed with dedicated thyroid ultrasound 12/20/2017 and strongly favored to reflect a pseudonodule in the setting of goiter. Please refer to prior thyroid ultrasound 12/20/2017 for further description. Electronically Signed   By: Kellie Simmering DO   On: 02/18/2020 19:42   CT Cervical Spine Wo Contrast  Result Date: 02/18/2020 CLINICAL DATA:  Head trauma, headache. Spine fracture, cervical, traumatic. Additional history provided: Fall, posterior head injury. EXAM: CT HEAD WITHOUT CONTRAST CT CERVICAL SPINE WITHOUT CONTRAST TECHNIQUE: Multidetector CT imaging of the head and cervical spine was performed following the standard protocol without intravenous contrast. Multiplanar CT image reconstructions of the cervical spine were also generated. COMPARISON:  Head CT 05/06/2019, CT cervical spine 05/06/2019, CT chest 02/11/2018, thyroid ultrasound 12/20/2017. FINDINGS: CT HEAD FINDINGS Brain: There is a small amount of acute extra-axial hemorrhage along the left aspect of the anterior falx which may be subdural or subarachnoid (series 3, images 14-16). There is stable moderate ill-defined hypoattenuation within the cerebral white matter which is nonspecific, but consistent with  chronic small vessel ischemic disease. Unchanged mild generalized parenchymal atrophy. No demarcated cortical infarct. No evidence of intracranial mass. No midline shift. Vascular: No hyperdense vessel.  Atherosclerotic calcifications. Skull: Normal. Negative for fracture or focal lesion. Sinuses/Orbits: Visualized orbits show no acute finding. Mild ethmoid sinus mucosal thickening. No significant mastoid effusion. Other: Large midline posterior scalp hematoma. CT CERVICAL SPINE FINDINGS Alignment: Straightening of the expected cervical lordosis. Trace C7-T1 anterolisthesis. Skull base and vertebrae: The basion-dental and atlanto-dental intervals are maintained.No evidence of acute fracture to the cervical spine. 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 There is also fusion of the C2-C3 posterior elements. Soft tissues and spinal canal: No prevertebral soft tissue swelling or visible canal hematoma. Disc levels: Cervical spondylosis with multilevel disc space narrowing, posterior disc osteophytes and uncovertebral hypertrophy. Upper chest: No consolidation within the imaged lung apices. No visible pneumothorax. Other: There has been interval increase in size of an exophytic right thyroid lesion, now 3.1 cm (series 4, image 66). This measured 2.4 cm on prior chest CT 02/11/2018. This finding was evaluated on dedicated thyroid ultrasound 12/20/2017 and strongly favored to reflect a pseudonodule in the setting of goiter. Please refer to this prior report for further description. These results were called by telephone at the time of interpretation on 02/18/2020 at 7:42 pm to provider Kaiyla Stahly , who verbally acknowledged these results. IMPRESSION: CT head: 1. Small volume acute extra-axial hemorrhage along the left aspect of the anterior falx which may be subdural or subarachnoid. 2. Large posterior midline scalp hematoma. 3. Stable generalized atrophy of the brain and  chronic small vessel ischemic disease. 4. Mild ethmoid sinus mucosal thickening. CT cervical spine: 1. No evidence of acute fracture to the cervical spine. 2. Redemonstrated C2-C3 and C3-C4 fusion. 3. Minimal C7-T1 grade 1 anterolisthesis, nonspecific but may be degenerative. 4. A 3 cm exophytic lesion arising from the right thyroid lobe has increased in size since chest CT 02/11/2018 (previously 2.4 cm). However, this finding was previously assessed with dedicated thyroid ultrasound 12/20/2017 and strongly favored to reflect a pseudonodule in the setting of goiter. Please refer to prior thyroid ultrasound 12/20/2017 for further description. Electronically Signed   By: Kellie Simmering DO   On: 02/18/2020 19:42    Procedures .Critical Care Performed by: Lennice Sites, DO Authorized by: Lennice Sites, DO  Critical care provider statement:    Critical care time (minutes):  40   Critical care was necessary to treat or prevent imminent or life-threatening deterioration of the following conditions:  CNS failure or compromise   Critical care was time spent personally by me on the following activities:  Blood draw for specimens, development of treatment plan with patient or surrogate, discussions with primary provider, evaluation of patient's response to treatment, examination of patient, obtaining history from patient or surrogate, ordering and performing treatments and interventions, ordering and review of radiographic studies, ordering and review of laboratory studies, pulse oximetry, re-evaluation of patient's condition and review of old charts   I assumed direction of critical care for this patient from another provider in my specialty: no     (including critical care time)  Medications Ordered in ED Medications  acetaminophen (TYLENOL) tablet 1,000 mg (1,000 mg Oral Given 02/18/20 1847)    ED Course  I have reviewed the triage vital signs and the nursing notes.  Pertinent labs & imaging results  that were available during my care of the patient were reviewed by me and considered in my medical decision making (see chart for details).    MDM Rules/Calculators/A&P                      Ariela Bendon Rad is an 84 year old female who presents to the ED after mechanical fall.  Patient with hematoma to the back of her head.  She did not lose consciousness.  She is not on blood thinners.  Has a history of dementia but overall appears neurologically intact.  Does not have any bony tenderness.  Was ambulatory afterwards.  We will get a head CT and neck CT to evaluate for any injuries.  There is no obvious laceration, small skin abrasion.  Radiology called on the phone to say that there is a small volume acute extra-axial hemorrhage along the anterior falx which is likely a subdural or subarachnoid.  Suspect subdural given trauma history.  Lab work was overall unremarkable.  Talked with neurosurgery, Meyran, who agrees with repeat head CT at around 6 hours to see if any changes given that this is a small bleed and that patient is neurologically intact and not on blood thinners.  If repeat head CT is stable will be fine for discharge.  Will repage if any type of progression or change in patient behavior.  Patient signed out to oncoming ED staff with patient pending repeat head scan.  This chart was dictated using voice recognition software.  Despite best efforts to proofread,  errors can occur which can change the documentation meaning.    Final Clinical Impression(s) / ED Diagnoses Final diagnoses:  Intracranial bleed [facility])    Rx / DC Orders ED Discharge Orders    None       Lennice Sites, DO 02/18/20 2140

## 2020-02-18 NOTE — ED Notes (Signed)
Returned from radiology. 

## 2020-02-18 NOTE — ED Triage Notes (Signed)
Arrives via EMS from [facility] ([place]). C/C witnessed fall with significant posterior hematoma, fell on concrete, denies LOC, no blood thinners, not complaining of pain to neck or back. Hx of dementia, with repetitive questioning, patient at baseline per staff and caregiver. Ambulates w/o assistance.

## 2020-02-18 NOTE — ED Notes (Signed)
Transported to radiology 

## 2020-02-19 ENCOUNTER — Emergency Department (HOSPITAL_COMMUNITY): Payer: Medicare Other

## 2020-02-19 DIAGNOSIS — S065X0A Traumatic subdural hemorrhage without loss of consciousness, initial encounter: Secondary | ICD-10-CM | POA: Diagnosis not present

## 2020-02-19 DIAGNOSIS — S0003XA Contusion of scalp, initial encounter: Secondary | ICD-10-CM | POA: Diagnosis not present

## 2020-02-19 MED ORDER — TETANUS-DIPHTH-ACELL PERTUSSIS 5-2.5-18.5 LF-MCG/0.5 IM SUSP
0.5000 mL | Freq: Once | INTRAMUSCULAR | Status: AC
  Administered 2020-02-19: 0.5 mL via INTRAMUSCULAR
  Filled 2020-02-19: qty 0.5

## 2020-02-19 NOTE — ED Provider Notes (Signed)
I have informed the paitent of both CT scan reports and Dr. Sanjuana Kava discussion with neurosurgery.  She states no one had spoken to her about this.  EDP apologized for any previous miscommunication.    Alert and oriented x 3  FROM x 4 No emesis no seizure like activity.   No change in head CT, stable for discharge.  Strict return precautions given.    Results for orders placed or performed during the hospital encounter of 02/18/20  CBC with Differential  Result Value Ref Range   WBC 11.6 (H) 4.0 - 10.5 K/uL   RBC 4.30 3.87 - 5.11 MIL/uL   Hemoglobin 13.3 12.0 - 15.0 g/dL   HCT 39.7 36.0 - 46.0 %   MCV 92.3 80.0 - 100.0 fL   MCH 30.9 26.0 - 34.0 pg   MCHC 33.5 30.0 - 36.0 g/dL   RDW 13.4 11.5 - 15.5 %   Platelets 258 150 - 400 K/uL   nRBC 0.0 0.0 - 0.2 %   Neutrophils Relative % 75 %   Neutro Abs 8.7 (H) 1.7 - 7.7 K/uL   Lymphocytes Relative 16 %   Lymphs Abs 1.9 0.7 - 4.0 K/uL   Monocytes Relative 7 %   Monocytes Absolute 0.8 0.1 - 1.0 K/uL   Eosinophils Relative 2 %   Eosinophils Absolute 0.2 0.0 - 0.5 K/uL   Basophils Relative 0 %   Basophils Absolute 0.0 0.0 - 0.1 K/uL   Immature Granulocytes 0 %   Abs Immature Granulocytes 0.04 0.00 - 0.07 K/uL  Basic metabolic panel  Result Value Ref Range   Sodium 143 135 - 145 mmol/L   Potassium 3.4 (L) 3.5 - 5.1 mmol/L   Chloride 107 98 - 111 mmol/L   CO2 26 22 - 32 mmol/L   Glucose, Bld 162 (H) 70 - 99 mg/dL   BUN 20 8 - 23 mg/dL   Creatinine, Ser 0.89 0.44 - 1.00 mg/dL   Calcium 9.5 8.9 - 10.3 mg/dL   GFR calc non Af Amer >60 >60 mL/min   GFR calc Af Amer >60 >60 mL/min   Anion gap 10 5 - 15   CT Head Wo Contrast  Result Date: 02/19/2020 CLINICAL DATA:  Head trauma, follow-up intracranial hemorrhage EXAM: CT HEAD WITHOUT CONTRAST TECHNIQUE: Contiguous axial images were obtained from the base of the skull through the vertex without intravenous contrast. COMPARISON:  CT head 02/18/2020 FINDINGS: Brain: Trace hyperdense left  parafalcine thickening is unchanged from comparison exam compatible with small volume parafalcine subdural hematoma. No significant interval expansion or new sites of of hemorrhage. No CT evidence of large vascular territory infarct. No mass effect or midline shift. Patchy and confluent areas of white matter hypoattenuation are most compatible with chronic microvascular angiopathy. Symmetric prominence of the ventricles, cisterns and sulci compatible with moderate parenchymal volume loss. Vascular: Atherosclerotic calcification of the carotid siphons. No hyperdense vessel. Skull: High posterior midline scalp hematoma, slightly decreased in thickness from comparison with several punctate echogenic foci likely reflecting debris. No subjacent calvarial fracture. No worrisome osseous lesions. Sinuses/Orbits: Paranasal sinuses and mastoid air cells are predominantly clear. Orbital structures are unremarkable aside from prior lens extractions. Other: None IMPRESSION: 1. Stable small volume parafalcine subdural hematoma. No significant interval expansion or new sites of hemorrhage. 2. High posterior midline scalp hematoma, slightly decreased in thickness from comparison with several punctate echogenic foci likely reflecting debris. No subjacent calvarial fracture. 3. No other significant interval change. 4. Moderate parenchymal volume loss and  chronic microvascular angiopathy. Electronically Signed   By: Lovena Le M.D.   On: 02/19/2020 03:19   CT Head Wo Contrast  Result Date: 02/18/2020 CLINICAL DATA:  Head trauma, headache. Spine fracture, cervical, traumatic. Additional history provided: Fall, posterior head injury. EXAM: CT HEAD WITHOUT CONTRAST CT CERVICAL SPINE WITHOUT CONTRAST TECHNIQUE: Multidetector CT imaging of the head and cervical spine was performed following the standard protocol without intravenous contrast. Multiplanar CT image reconstructions of the cervical spine were also generated. COMPARISON:   Head CT 05/06/2019, CT cervical spine 05/06/2019, CT chest 02/11/2018, thyroid ultrasound 12/20/2017. FINDINGS: CT HEAD FINDINGS Brain: There is a small amount of acute extra-axial hemorrhage along the left aspect of the anterior falx which may be subdural or subarachnoid (series 3, images 14-16). There is stable moderate ill-defined hypoattenuation within the cerebral white matter which is nonspecific, but consistent with chronic small vessel ischemic disease. Unchanged mild generalized parenchymal atrophy. No demarcated cortical infarct. No evidence of intracranial mass. No midline shift. Vascular: No hyperdense vessel.  Atherosclerotic calcifications. Skull: Normal. Negative for fracture or focal lesion. Sinuses/Orbits: Visualized orbits show no acute finding. Mild ethmoid sinus mucosal thickening. No significant mastoid effusion. Other: Large midline posterior scalp hematoma. CT CERVICAL SPINE FINDINGS Alignment: Straightening of the expected cervical lordosis. Trace C7-T1 anterolisthesis. Skull base and vertebrae: The basion-dental and atlanto-dental intervals are maintained.No evidence of acute fracture to the cervical spine. 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 There is also fusion of the C2-C3 posterior elements. Soft tissues and spinal canal: No prevertebral soft tissue swelling or visible canal hematoma. Disc levels: Cervical spondylosis with multilevel disc space narrowing, posterior disc osteophytes and uncovertebral hypertrophy. Upper chest: No consolidation within the imaged lung apices. No visible pneumothorax. Other: There has been interval increase in size of an exophytic right thyroid lesion, now 3.1 cm (series 4, image 66). This measured 2.4 cm on prior chest CT 02/11/2018. This finding was evaluated on dedicated thyroid ultrasound 12/20/2017 and strongly favored to reflect a pseudonodule in the setting of goiter. Please refer to this prior report  for further description. These results were called by telephone at the time of interpretation on 02/18/2020 at 7:42 pm to provider ADAM CURATOLO , who verbally acknowledged these results. IMPRESSION: CT head: 1. Small volume acute extra-axial hemorrhage along the left aspect of the anterior falx which may be subdural or subarachnoid. 2. Large posterior midline scalp hematoma. 3. Stable generalized atrophy of the brain and chronic small vessel ischemic disease. 4. Mild ethmoid sinus mucosal thickening. CT cervical spine: 1. No evidence of acute fracture to the cervical spine. 2. Redemonstrated C2-C3 and C3-C4 fusion. 3. Minimal C7-T1 grade 1 anterolisthesis, nonspecific but may be degenerative. 4. A 3 cm exophytic lesion arising from the right thyroid lobe has increased in size since chest CT 02/11/2018 (previously 2.4 cm). However, this finding was previously assessed with dedicated thyroid ultrasound 12/20/2017 and strongly favored to reflect a pseudonodule in the setting of goiter. Please refer to prior thyroid ultrasound 12/20/2017 for further description. Electronically Signed   By: Kellie Simmering DO   On: 02/18/2020 19:42   CT Cervical Spine Wo Contrast  Result Date: 02/18/2020 CLINICAL DATA:  Head trauma, headache. Spine fracture, cervical, traumatic. Additional history provided: Fall, posterior head injury. EXAM: CT HEAD WITHOUT CONTRAST CT CERVICAL SPINE WITHOUT CONTRAST TECHNIQUE: Multidetector CT imaging of the head and cervical spine was performed following the standard protocol without intravenous contrast. Multiplanar CT image reconstructions of the  cervical spine were also generated. COMPARISON:  Head CT 05/06/2019, CT cervical spine 05/06/2019, CT chest 02/11/2018, thyroid ultrasound 12/20/2017. FINDINGS: CT HEAD FINDINGS Brain: There is a small amount of acute extra-axial hemorrhage along the left aspect of the anterior falx which may be subdural or subarachnoid (series 3, images 14-16). There is  stable moderate ill-defined hypoattenuation within the cerebral white matter which is nonspecific, but consistent with chronic small vessel ischemic disease. Unchanged mild generalized parenchymal atrophy. No demarcated cortical infarct. No evidence of intracranial mass. No midline shift. Vascular: No hyperdense vessel.  Atherosclerotic calcifications. Skull: Normal. Negative for fracture or focal lesion. Sinuses/Orbits: Visualized orbits show no acute finding. Mild ethmoid sinus mucosal thickening. No significant mastoid effusion. Other: Large midline posterior scalp hematoma. CT CERVICAL SPINE FINDINGS Alignment: Straightening of the expected cervical lordosis. Trace C7-T1 anterolisthesis. Skull base and vertebrae: The basion-dental and atlanto-dental intervals are maintained.No evidence of acute fracture to the cervical spine. 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 There is also fusion of the C2-C3 posterior elements. Soft tissues and spinal canal: No prevertebral soft tissue swelling or visible canal hematoma. Disc levels: Cervical spondylosis with multilevel disc space narrowing, posterior disc osteophytes and uncovertebral hypertrophy. Upper chest: No consolidation within the imaged lung apices. No visible pneumothorax. Other: There has been interval increase in size of an exophytic right thyroid lesion, now 3.1 cm (series 4, image 66). This measured 2.4 cm on prior chest CT 02/11/2018. This finding was evaluated on dedicated thyroid ultrasound 12/20/2017 and strongly favored to reflect a pseudonodule in the setting of goiter. Please refer to this prior report for further description. These results were called by telephone at the time of interpretation on 02/18/2020 at 7:42 pm to provider ADAM CURATOLO , who verbally acknowledged these results. IMPRESSION: CT head: 1. Small volume acute extra-axial hemorrhage along the left aspect of the anterior falx which may be  subdural or subarachnoid. 2. Large posterior midline scalp hematoma. 3. Stable generalized atrophy of the brain and chronic small vessel ischemic disease. 4. Mild ethmoid sinus mucosal thickening. CT cervical spine: 1. No evidence of acute fracture to the cervical spine. 2. Redemonstrated C2-C3 and C3-C4 fusion. 3. Minimal C7-T1 grade 1 anterolisthesis, nonspecific but may be degenerative. 4. A 3 cm exophytic lesion arising from the right thyroid lobe has increased in size since chest CT 02/11/2018 (previously 2.4 cm). However, this finding was previously assessed with dedicated thyroid ultrasound 12/20/2017 and strongly favored to reflect a pseudonodule in the setting of goiter. Please refer to prior thyroid ultrasound 12/20/2017 for further description. Electronically Signed   By: Kellie Simmering DO   On: 02/18/2020 19:42   VAS US CAROTID  Result Date: 02/02/2020 Carotid Arterial Duplex Study Indications:       Carotid artery disease and mild LICA stenosis follow-up. Risk Factors:      Hypertension, hyperlipidemia, past history of smoking. Comparison Study:  In 10/18, a carotid duplex showed RICA velocities of 55/16                    cm/sec and LICA velocities of 123456 cm/sec. Performing Technologist: Mariane Masters RVT  Examination Guidelines: A complete evaluation includes B-mode imaging, spectral Doppler, color Doppler, and power Doppler as needed of all accessible portions of each vessel. Bilateral testing is considered an integral part of a complete examination. Limited examinations for reoccurring indications may be performed as noted.  Right Carotid Findings: +----------+--------+--------+--------+------------------+--------+  PSV cm/sEDV cm/sStenosisPlaque DescriptionComments +----------+--------+--------+--------+------------------+--------+ CCA Prox  60      11                                         +----------+--------+--------+--------+------------------+--------+ CCA  Distal52      13                                         +----------+--------+--------+--------+------------------+--------+ ICA Prox  43      12              smooth                     +----------+--------+--------+--------+------------------+--------+ ICA Mid   45      14      1-39%                              +----------+--------+--------+--------+------------------+--------+ ICA Distal76      27                                         +----------+--------+--------+--------+------------------+--------+ ECA       73      6                                          +----------+--------+--------+--------+------------------+--------+ +----------+--------+-------+----------------+-------------------+           PSV cm/sEDV cmsDescribe        Arm Pressure (mmHG) +----------+--------+-------+----------------+-------------------+ [id number]            Multiphasic, [id number]                 +----------+--------+-------+----------------+-------------------+ +---------+--------+--+--------+--+---------+ VertebralPSV cm/s53EDV cm/s13Antegrade +---------+--------+--+--------+--+---------+  Left Carotid Findings: +----------+--------+--------+--------+----------------------+--------+           PSV cm/sEDV cm/sStenosisPlaque Description    Comments +----------+--------+--------+--------+----------------------+--------+ CCA Prox  110     20                                             +----------+--------+--------+--------+----------------------+--------+ CCA Distal58      16                                             +----------+--------+--------+--------+----------------------+--------+ ICA Prox  72      26      1-39%   focal and heterogenous         +----------+--------+--------+--------+----------------------+--------+ ICA Mid   63      23                                              +----------+--------+--------+--------+----------------------+--------+ ICA Distal63      23                                             +----------+--------+--------+--------+----------------------+--------+  ECA       76      11                                             +----------+--------+--------+--------+----------------------+--------+ +----------+--------+--------+----------------+-------------------+           PSV cm/sEDV cm/sDescribe        Arm Pressure (mmHG) +----------+--------+--------+----------------+-------------------+ [id number]             Multiphasic, [id number]                 +----------+--------+--------+----------------+-------------------+ +---------+--------+--+--------+--+---------+ VertebralPSV cm/s45EDV cm/s16Antegrade +---------+--------+--+--------+--+---------+   Summary: Right Carotid: Velocities in the right ICA are consistent with a 1-39% stenosis. Left Carotid: Velocities in the left ICA are consistent with a 1-39% stenosis. Vertebrals:  Bilateral vertebral arteries demonstrate antegrade flow. Subclavians: Normal flow hemodynamics were seen in bilateral subclavian              arteries. *See table(s) above for measurements and observations.  Electronically signed by Ida Rogue MD on 02/02/2020 at 6:36:41 PM.    Final    Return for weakness, numbness, changes in vision or speech, fevers >100.4 unrelieved by medication, shortness of breath, intractable vomiting, or diarrhea, abdominal pain, Inability to tolerate liquids or food, cough, altered mental status or any concerns. No signs of systemic illness or infection. The patient is nontoxic-appearing on exam and vital signs are within normal limits.   I have reviewed the triage vital signs and the nursing notes. Pertinent labs &imaging results that were available during my care of the patient were reviewed by me and considered in my medical decision making (see chart for  details).  After history, exam, and medical workup I feel the patient has been appropriately medically screened and is safe for discharge home. Pertinent diagnoses were discussed with the patient. Patient was givenstrictreturn precautions.      Kyron Schlitt, MD 02/19/20 [phone]

## 2020-02-19 NOTE — Discharge Instructions (Signed)
No aspirin or blood thinners

## 2020-02-24 ENCOUNTER — Other Ambulatory Visit: Payer: Self-pay | Admitting: Family Medicine

## 2020-02-24 ENCOUNTER — Other Ambulatory Visit: Payer: Self-pay | Admitting: Neurology

## 2020-02-24 DIAGNOSIS — I609 Nontraumatic subarachnoid hemorrhage, unspecified: Secondary | ICD-10-CM | POA: Insufficient documentation

## 2020-02-25 ENCOUNTER — Other Ambulatory Visit: Payer: Self-pay

## 2020-02-25 ENCOUNTER — Other Ambulatory Visit: Payer: Self-pay | Admitting: Neurology

## 2020-02-25 DIAGNOSIS — Z20828 Contact with and (suspected) exposure to other viral communicable diseases: Secondary | ICD-10-CM | POA: Diagnosis not present

## 2020-02-25 DIAGNOSIS — Z1159 Encounter for screening for other viral diseases: Secondary | ICD-10-CM | POA: Diagnosis not present

## 2020-02-25 MED ORDER — RIVASTIGMINE TARTRATE 1.5 MG PO CAPS: ORAL_CAPSULE | ORAL | 0 refills | Status: DC

## 2020-02-25 NOTE — Telephone Encounter (Signed)
Left message with the after hour service on 02-25-20 @ 12:00   Caller states patient has appt on 03-19-20 but she is pit of memory  Medication. Caller states the pharmacy they could not refill medication please call

## 2020-02-25 NOTE — Telephone Encounter (Signed)
Script called in to pharmacy on file

## 2020-02-25 NOTE — Telephone Encounter (Signed)
Pt sister called informed that Batbara's script was called in to [facility]

## 2020-02-27 DIAGNOSIS — R296 Repeated falls: Secondary | ICD-10-CM | POA: Diagnosis not present

## 2020-02-27 DIAGNOSIS — M25551 Pain in right hip: Secondary | ICD-10-CM | POA: Diagnosis not present

## 2020-02-27 DIAGNOSIS — M6281 Muscle weakness (generalized): Secondary | ICD-10-CM | POA: Diagnosis not present

## 2020-02-27 DIAGNOSIS — R41841 Cognitive communication deficit: Secondary | ICD-10-CM | POA: Diagnosis not present

## 2020-02-27 DIAGNOSIS — M25552 Pain in left hip: Secondary | ICD-10-CM | POA: Diagnosis not present

## 2020-02-27 DIAGNOSIS — R488 Other symbolic dysfunctions: Secondary | ICD-10-CM | POA: Diagnosis not present

## 2020-02-27 DIAGNOSIS — R2681 Unsteadiness on feet: Secondary | ICD-10-CM | POA: Diagnosis not present

## 2020-03-01 DIAGNOSIS — M25551 Pain in right hip: Secondary | ICD-10-CM | POA: Diagnosis not present

## 2020-03-01 DIAGNOSIS — M6281 Muscle weakness (generalized): Secondary | ICD-10-CM | POA: Diagnosis not present

## 2020-03-01 DIAGNOSIS — R2681 Unsteadiness on feet: Secondary | ICD-10-CM | POA: Diagnosis not present

## 2020-03-01 DIAGNOSIS — M25552 Pain in left hip: Secondary | ICD-10-CM | POA: Diagnosis not present

## 2020-03-01 DIAGNOSIS — R41841 Cognitive communication deficit: Secondary | ICD-10-CM | POA: Diagnosis not present

## 2020-03-01 DIAGNOSIS — R488 Other symbolic dysfunctions: Secondary | ICD-10-CM | POA: Diagnosis not present

## 2020-03-02 DIAGNOSIS — R41841 Cognitive communication deficit: Secondary | ICD-10-CM | POA: Diagnosis not present

## 2020-03-02 DIAGNOSIS — M6281 Muscle weakness (generalized): Secondary | ICD-10-CM | POA: Diagnosis not present

## 2020-03-02 DIAGNOSIS — M25551 Pain in right hip: Secondary | ICD-10-CM | POA: Diagnosis not present

## 2020-03-02 DIAGNOSIS — M25552 Pain in left hip: Secondary | ICD-10-CM | POA: Diagnosis not present

## 2020-03-02 DIAGNOSIS — R488 Other symbolic dysfunctions: Secondary | ICD-10-CM | POA: Diagnosis not present

## 2020-03-02 DIAGNOSIS — R2681 Unsteadiness on feet: Secondary | ICD-10-CM | POA: Diagnosis not present

## 2020-03-03 DIAGNOSIS — M6281 Muscle weakness (generalized): Secondary | ICD-10-CM | POA: Diagnosis not present

## 2020-03-03 DIAGNOSIS — Z1159 Encounter for screening for other viral diseases: Secondary | ICD-10-CM | POA: Diagnosis not present

## 2020-03-03 DIAGNOSIS — R41841 Cognitive communication deficit: Secondary | ICD-10-CM | POA: Diagnosis not present

## 2020-03-03 DIAGNOSIS — Z20828 Contact with and (suspected) exposure to other viral communicable diseases: Secondary | ICD-10-CM | POA: Diagnosis not present

## 2020-03-03 DIAGNOSIS — M25551 Pain in right hip: Secondary | ICD-10-CM | POA: Diagnosis not present

## 2020-03-03 DIAGNOSIS — R488 Other symbolic dysfunctions: Secondary | ICD-10-CM | POA: Diagnosis not present

## 2020-03-03 DIAGNOSIS — R2681 Unsteadiness on feet: Secondary | ICD-10-CM | POA: Diagnosis not present

## 2020-03-03 DIAGNOSIS — M25552 Pain in left hip: Secondary | ICD-10-CM | POA: Diagnosis not present

## 2020-03-08 DIAGNOSIS — R2681 Unsteadiness on feet: Secondary | ICD-10-CM | POA: Diagnosis not present

## 2020-03-08 DIAGNOSIS — M25552 Pain in left hip: Secondary | ICD-10-CM | POA: Diagnosis not present

## 2020-03-08 DIAGNOSIS — M25551 Pain in right hip: Secondary | ICD-10-CM | POA: Diagnosis not present

## 2020-03-08 DIAGNOSIS — R41841 Cognitive communication deficit: Secondary | ICD-10-CM | POA: Diagnosis not present

## 2020-03-08 DIAGNOSIS — R488 Other symbolic dysfunctions: Secondary | ICD-10-CM | POA: Diagnosis not present

## 2020-03-08 DIAGNOSIS — M6281 Muscle weakness (generalized): Secondary | ICD-10-CM | POA: Diagnosis not present

## 2020-03-09 DIAGNOSIS — R41841 Cognitive communication deficit: Secondary | ICD-10-CM | POA: Diagnosis not present

## 2020-03-09 DIAGNOSIS — R488 Other symbolic dysfunctions: Secondary | ICD-10-CM | POA: Diagnosis not present

## 2020-03-09 DIAGNOSIS — R2681 Unsteadiness on feet: Secondary | ICD-10-CM | POA: Diagnosis not present

## 2020-03-09 DIAGNOSIS — M25551 Pain in right hip: Secondary | ICD-10-CM | POA: Diagnosis not present

## 2020-03-09 DIAGNOSIS — M6281 Muscle weakness (generalized): Secondary | ICD-10-CM | POA: Diagnosis not present

## 2020-03-09 DIAGNOSIS — M25552 Pain in left hip: Secondary | ICD-10-CM | POA: Diagnosis not present

## 2020-03-10 DIAGNOSIS — M25551 Pain in right hip: Secondary | ICD-10-CM | POA: Diagnosis not present

## 2020-03-10 DIAGNOSIS — R2681 Unsteadiness on feet: Secondary | ICD-10-CM | POA: Diagnosis not present

## 2020-03-10 DIAGNOSIS — Z20828 Contact with and (suspected) exposure to other viral communicable diseases: Secondary | ICD-10-CM | POA: Diagnosis not present

## 2020-03-10 DIAGNOSIS — R41841 Cognitive communication deficit: Secondary | ICD-10-CM | POA: Diagnosis not present

## 2020-03-10 DIAGNOSIS — M25552 Pain in left hip: Secondary | ICD-10-CM | POA: Diagnosis not present

## 2020-03-10 DIAGNOSIS — M6281 Muscle weakness (generalized): Secondary | ICD-10-CM | POA: Diagnosis not present

## 2020-03-10 DIAGNOSIS — Z1159 Encounter for screening for other viral diseases: Secondary | ICD-10-CM | POA: Diagnosis not present

## 2020-03-10 DIAGNOSIS — R488 Other symbolic dysfunctions: Secondary | ICD-10-CM | POA: Diagnosis not present

## 2020-03-12 DIAGNOSIS — R2681 Unsteadiness on feet: Secondary | ICD-10-CM | POA: Diagnosis not present

## 2020-03-12 DIAGNOSIS — R488 Other symbolic dysfunctions: Secondary | ICD-10-CM | POA: Diagnosis not present

## 2020-03-12 DIAGNOSIS — M25551 Pain in right hip: Secondary | ICD-10-CM | POA: Diagnosis not present

## 2020-03-12 DIAGNOSIS — M6281 Muscle weakness (generalized): Secondary | ICD-10-CM | POA: Diagnosis not present

## 2020-03-12 DIAGNOSIS — M25552 Pain in left hip: Secondary | ICD-10-CM | POA: Diagnosis not present

## 2020-03-12 DIAGNOSIS — R41841 Cognitive communication deficit: Secondary | ICD-10-CM | POA: Diagnosis not present

## 2020-03-17 DIAGNOSIS — Z1159 Encounter for screening for other viral diseases: Secondary | ICD-10-CM | POA: Diagnosis not present

## 2020-03-17 DIAGNOSIS — Z20828 Contact with and (suspected) exposure to other viral communicable diseases: Secondary | ICD-10-CM | POA: Diagnosis not present

## 2020-03-18 DIAGNOSIS — Z1283 Encounter for screening for malignant neoplasm of skin: Secondary | ICD-10-CM | POA: Diagnosis not present

## 2020-03-18 DIAGNOSIS — D225 Melanocytic nevi of trunk: Secondary | ICD-10-CM | POA: Diagnosis not present

## 2020-03-19 ENCOUNTER — Ambulatory Visit: Payer: Medicare Other | Admitting: Neurology

## 2020-03-23 ENCOUNTER — Other Ambulatory Visit: Payer: Self-pay | Admitting: Neurology

## 2020-03-25 ENCOUNTER — Telehealth: Payer: Self-pay | Admitting: Family Medicine

## 2020-03-25 NOTE — Telephone Encounter (Signed)
Felicia Kelly from [facility] stated they need an update px for the medication Synthroid. They have 1 pill M-F and 2 pills on Sunday.   Phone: [phone]  FAX: [phone]

## 2020-03-26 ENCOUNTER — Other Ambulatory Visit: Payer: Self-pay | Admitting: Family Medicine

## 2020-03-26 NOTE — Telephone Encounter (Signed)
Spoke with the pts sister, Mrs Loma Sousa, informed her of the message below and she stated the pt has been seen by Dr Delrae Rend (endo) for her thyroid and he changed the dosing and this is correct.  I advised Mrs Loma Sousa Dr Cindra Eves office should contact the pharmacy regarding this and she agreed.  I called [employer] and advised [place] to contact Dr Cindra Eves office for refills and clarification of dosing and she agreed.

## 2020-03-26 NOTE — Telephone Encounter (Signed)
We do not have that dosing in our system, but if you can confirm with her (or with her sister) that that is the dose she has been taking we can refill at that sig.   She hasn't had thyroid checked in 9 months unless it was done somewhere that I do not have results. Sister might know more about these details.

## 2020-03-30 DIAGNOSIS — E0510 Thyrotoxicosis with toxic single thyroid nodule without thyrotoxic crisis or storm: Secondary | ICD-10-CM | POA: Diagnosis not present

## 2020-03-30 DIAGNOSIS — E042 Nontoxic multinodular goiter: Secondary | ICD-10-CM | POA: Diagnosis not present

## 2020-03-30 DIAGNOSIS — E890 Postprocedural hypothyroidism: Secondary | ICD-10-CM | POA: Diagnosis not present

## 2020-04-01 ENCOUNTER — Other Ambulatory Visit: Payer: Self-pay | Admitting: *Deleted

## 2020-04-01 DIAGNOSIS — E038 Other specified hypothyroidism: Secondary | ICD-10-CM

## 2020-04-01 MED ORDER — LEVOTHYROXINE SODIUM 50 MCG PO TABS: 50.0000 ug | ORAL_TABLET | Freq: Every day | ORAL | 1 refills | Status: DC

## 2020-04-07 ENCOUNTER — Other Ambulatory Visit: Payer: Medicare Other

## 2020-04-07 ENCOUNTER — Other Ambulatory Visit: Payer: Self-pay | Admitting: Family Medicine

## 2020-04-07 DIAGNOSIS — E876 Hypokalemia: Secondary | ICD-10-CM

## 2020-04-15 DIAGNOSIS — R2681 Unsteadiness on feet: Secondary | ICD-10-CM | POA: Diagnosis not present

## 2020-04-15 DIAGNOSIS — R488 Other symbolic dysfunctions: Secondary | ICD-10-CM | POA: Diagnosis not present

## 2020-04-15 DIAGNOSIS — M25552 Pain in left hip: Secondary | ICD-10-CM | POA: Diagnosis not present

## 2020-04-15 DIAGNOSIS — M25551 Pain in right hip: Secondary | ICD-10-CM | POA: Diagnosis not present

## 2020-04-15 DIAGNOSIS — R296 Repeated falls: Secondary | ICD-10-CM | POA: Diagnosis not present

## 2020-04-15 DIAGNOSIS — M6281 Muscle weakness (generalized): Secondary | ICD-10-CM | POA: Diagnosis not present

## 2020-04-20 ENCOUNTER — Other Ambulatory Visit: Payer: Self-pay

## 2020-04-20 ENCOUNTER — Other Ambulatory Visit: Payer: Medicare Other

## 2020-04-20 DIAGNOSIS — E7800 Pure hypercholesterolemia, unspecified: Secondary | ICD-10-CM

## 2020-04-20 LAB — LIPID PANEL
Chol/HDL Ratio: 4.5 ratio — ABNORMAL HIGH (ref 0.0–4.4)
Cholesterol, Total: 183 mg/dL (ref 100–199)
HDL: 41 mg/dL (ref >39)
LDL Chol Calc (NIH): 110 mg/dL — ABNORMAL HIGH (ref 0–99)
Triglycerides: 180 mg/dL — ABNORMAL HIGH (ref 0–149)
VLDL Cholesterol Cal: 32 mg/dL (ref 5–40)

## 2020-04-20 LAB — HEPATIC FUNCTION PANEL
ALT: 29 IU/L (ref 0–32)
AST: 27 IU/L (ref 0–40)
Albumin: 4.3 g/dL (ref 3.6–4.6)
Alkaline Phosphatase: 92 IU/L (ref 48–121)
Bilirubin Total: 0.3 mg/dL (ref 0.0–1.2)
Bilirubin, Direct: 0.08 mg/dL (ref 0.00–0.40)
Total Protein: 6.8 g/dL (ref 6.0–8.5)

## 2020-04-21 DIAGNOSIS — R488 Other symbolic dysfunctions: Secondary | ICD-10-CM | POA: Diagnosis not present

## 2020-04-21 DIAGNOSIS — M6281 Muscle weakness (generalized): Secondary | ICD-10-CM | POA: Diagnosis not present

## 2020-04-21 DIAGNOSIS — Z1159 Encounter for screening for other viral diseases: Secondary | ICD-10-CM | POA: Diagnosis not present

## 2020-04-21 DIAGNOSIS — M25552 Pain in left hip: Secondary | ICD-10-CM | POA: Diagnosis not present

## 2020-04-21 DIAGNOSIS — R2681 Unsteadiness on feet: Secondary | ICD-10-CM | POA: Diagnosis not present

## 2020-04-21 DIAGNOSIS — M25551 Pain in right hip: Secondary | ICD-10-CM | POA: Diagnosis not present

## 2020-04-21 DIAGNOSIS — Z20828 Contact with and (suspected) exposure to other viral communicable diseases: Secondary | ICD-10-CM | POA: Diagnosis not present

## 2020-04-21 DIAGNOSIS — R296 Repeated falls: Secondary | ICD-10-CM | POA: Diagnosis not present

## 2020-04-22 DIAGNOSIS — R488 Other symbolic dysfunctions: Secondary | ICD-10-CM | POA: Diagnosis not present

## 2020-04-22 DIAGNOSIS — R2681 Unsteadiness on feet: Secondary | ICD-10-CM | POA: Diagnosis not present

## 2020-04-22 DIAGNOSIS — M25552 Pain in left hip: Secondary | ICD-10-CM | POA: Diagnosis not present

## 2020-04-22 DIAGNOSIS — M25551 Pain in right hip: Secondary | ICD-10-CM | POA: Diagnosis not present

## 2020-04-22 DIAGNOSIS — R296 Repeated falls: Secondary | ICD-10-CM | POA: Diagnosis not present

## 2020-04-22 DIAGNOSIS — M6281 Muscle weakness (generalized): Secondary | ICD-10-CM | POA: Diagnosis not present

## 2020-04-23 ENCOUNTER — Telehealth: Payer: Self-pay | Admitting: Physician Assistant

## 2020-04-23 DIAGNOSIS — M25551 Pain in right hip: Secondary | ICD-10-CM | POA: Diagnosis not present

## 2020-04-23 DIAGNOSIS — E785 Hyperlipidemia, unspecified: Secondary | ICD-10-CM

## 2020-04-23 DIAGNOSIS — M25552 Pain in left hip: Secondary | ICD-10-CM | POA: Diagnosis not present

## 2020-04-23 DIAGNOSIS — R488 Other symbolic dysfunctions: Secondary | ICD-10-CM | POA: Diagnosis not present

## 2020-04-23 DIAGNOSIS — R296 Repeated falls: Secondary | ICD-10-CM | POA: Diagnosis not present

## 2020-04-23 DIAGNOSIS — R2681 Unsteadiness on feet: Secondary | ICD-10-CM | POA: Diagnosis not present

## 2020-04-23 DIAGNOSIS — E7800 Pure hypercholesterolemia, unspecified: Secondary | ICD-10-CM

## 2020-04-23 DIAGNOSIS — M6281 Muscle weakness (generalized): Secondary | ICD-10-CM | POA: Diagnosis not present

## 2020-04-23 NOTE — Telephone Encounter (Signed)
Felicia Kelly is returning Felicia Kelly's call in regards to lab results. She states she has an appt she is leaving for soon and won't be available to talk until after 3:30 PM. Please advise.

## 2020-04-26 DIAGNOSIS — M25552 Pain in left hip: Secondary | ICD-10-CM | POA: Diagnosis not present

## 2020-04-26 DIAGNOSIS — R488 Other symbolic dysfunctions: Secondary | ICD-10-CM | POA: Diagnosis not present

## 2020-04-26 DIAGNOSIS — R296 Repeated falls: Secondary | ICD-10-CM | POA: Diagnosis not present

## 2020-04-26 DIAGNOSIS — R2681 Unsteadiness on feet: Secondary | ICD-10-CM | POA: Diagnosis not present

## 2020-04-26 DIAGNOSIS — M25551 Pain in right hip: Secondary | ICD-10-CM | POA: Diagnosis not present

## 2020-04-26 DIAGNOSIS — M6281 Muscle weakness (generalized): Secondary | ICD-10-CM | POA: Diagnosis not present

## 2020-04-26 MED ORDER — FISH OIL 1000 MG PO CPDR: 1000.0000 mg | DELAYED_RELEASE_CAPSULE | Freq: Two times a day (BID) | ORAL | 0 refills | Status: DC

## 2020-04-26 MED ORDER — EZETIMIBE 10 MG PO TABS
10.0000 mg | ORAL_TABLET | Freq: Every day | ORAL | 3 refills | Status: DC
Start: 2020-04-26 — End: 2021-04-04

## 2020-04-26 NOTE — Telephone Encounter (Signed)
Spoke to Pt's POA. Gave her lab results. Sent in new Rx for Zetia 10mg  & scheduled 3 mth lab appt.

## 2020-04-26 NOTE — Telephone Encounter (Signed)
Patient returning call.

## 2020-04-26 NOTE — Telephone Encounter (Signed)
LM for pt to call back for Lab results.

## 2020-04-28 DIAGNOSIS — R2681 Unsteadiness on feet: Secondary | ICD-10-CM | POA: Diagnosis not present

## 2020-04-28 DIAGNOSIS — Z1159 Encounter for screening for other viral diseases: Secondary | ICD-10-CM | POA: Diagnosis not present

## 2020-04-28 DIAGNOSIS — M25552 Pain in left hip: Secondary | ICD-10-CM | POA: Diagnosis not present

## 2020-04-28 DIAGNOSIS — Z20828 Contact with and (suspected) exposure to other viral communicable diseases: Secondary | ICD-10-CM | POA: Diagnosis not present

## 2020-04-28 DIAGNOSIS — R296 Repeated falls: Secondary | ICD-10-CM | POA: Diagnosis not present

## 2020-04-28 DIAGNOSIS — M25551 Pain in right hip: Secondary | ICD-10-CM | POA: Diagnosis not present

## 2020-04-28 DIAGNOSIS — R488 Other symbolic dysfunctions: Secondary | ICD-10-CM | POA: Diagnosis not present

## 2020-04-28 DIAGNOSIS — M6281 Muscle weakness (generalized): Secondary | ICD-10-CM | POA: Diagnosis not present

## 2020-04-30 DIAGNOSIS — R296 Repeated falls: Secondary | ICD-10-CM | POA: Diagnosis not present

## 2020-04-30 DIAGNOSIS — M25552 Pain in left hip: Secondary | ICD-10-CM | POA: Diagnosis not present

## 2020-04-30 DIAGNOSIS — R488 Other symbolic dysfunctions: Secondary | ICD-10-CM | POA: Diagnosis not present

## 2020-04-30 DIAGNOSIS — M6281 Muscle weakness (generalized): Secondary | ICD-10-CM | POA: Diagnosis not present

## 2020-04-30 DIAGNOSIS — M25551 Pain in right hip: Secondary | ICD-10-CM | POA: Diagnosis not present

## 2020-04-30 DIAGNOSIS — R2681 Unsteadiness on feet: Secondary | ICD-10-CM | POA: Diagnosis not present

## 2020-05-03 DIAGNOSIS — M25551 Pain in right hip: Secondary | ICD-10-CM | POA: Diagnosis not present

## 2020-05-03 DIAGNOSIS — R488 Other symbolic dysfunctions: Secondary | ICD-10-CM | POA: Diagnosis not present

## 2020-05-03 DIAGNOSIS — M6281 Muscle weakness (generalized): Secondary | ICD-10-CM | POA: Diagnosis not present

## 2020-05-03 DIAGNOSIS — M25552 Pain in left hip: Secondary | ICD-10-CM | POA: Diagnosis not present

## 2020-05-03 DIAGNOSIS — R2681 Unsteadiness on feet: Secondary | ICD-10-CM | POA: Diagnosis not present

## 2020-05-03 DIAGNOSIS — R296 Repeated falls: Secondary | ICD-10-CM | POA: Diagnosis not present

## 2020-05-04 DIAGNOSIS — R296 Repeated falls: Secondary | ICD-10-CM | POA: Diagnosis not present

## 2020-05-04 DIAGNOSIS — M6281 Muscle weakness (generalized): Secondary | ICD-10-CM | POA: Diagnosis not present

## 2020-05-04 DIAGNOSIS — M25551 Pain in right hip: Secondary | ICD-10-CM | POA: Diagnosis not present

## 2020-05-04 DIAGNOSIS — R2681 Unsteadiness on feet: Secondary | ICD-10-CM | POA: Diagnosis not present

## 2020-05-04 DIAGNOSIS — M25552 Pain in left hip: Secondary | ICD-10-CM | POA: Diagnosis not present

## 2020-05-04 DIAGNOSIS — R488 Other symbolic dysfunctions: Secondary | ICD-10-CM | POA: Diagnosis not present

## 2020-05-05 DIAGNOSIS — Z1159 Encounter for screening for other viral diseases: Secondary | ICD-10-CM | POA: Diagnosis not present

## 2020-05-05 DIAGNOSIS — Z20828 Contact with and (suspected) exposure to other viral communicable diseases: Secondary | ICD-10-CM | POA: Diagnosis not present

## 2020-05-07 DIAGNOSIS — M6281 Muscle weakness (generalized): Secondary | ICD-10-CM | POA: Diagnosis not present

## 2020-05-07 DIAGNOSIS — R296 Repeated falls: Secondary | ICD-10-CM | POA: Diagnosis not present

## 2020-05-07 DIAGNOSIS — R2681 Unsteadiness on feet: Secondary | ICD-10-CM | POA: Diagnosis not present

## 2020-05-07 DIAGNOSIS — R488 Other symbolic dysfunctions: Secondary | ICD-10-CM | POA: Diagnosis not present

## 2020-05-07 DIAGNOSIS — M25552 Pain in left hip: Secondary | ICD-10-CM | POA: Diagnosis not present

## 2020-05-07 DIAGNOSIS — M25551 Pain in right hip: Secondary | ICD-10-CM | POA: Diagnosis not present

## 2020-05-11 ENCOUNTER — Other Ambulatory Visit: Payer: Self-pay | Admitting: Family Medicine

## 2020-05-11 DIAGNOSIS — E876 Hypokalemia: Secondary | ICD-10-CM

## 2020-05-12 DIAGNOSIS — Z20828 Contact with and (suspected) exposure to other viral communicable diseases: Secondary | ICD-10-CM | POA: Diagnosis not present

## 2020-05-12 DIAGNOSIS — Z1159 Encounter for screening for other viral diseases: Secondary | ICD-10-CM | POA: Diagnosis not present

## 2020-05-19 DIAGNOSIS — Z20828 Contact with and (suspected) exposure to other viral communicable diseases: Secondary | ICD-10-CM | POA: Diagnosis not present

## 2020-05-19 DIAGNOSIS — Z1159 Encounter for screening for other viral diseases: Secondary | ICD-10-CM | POA: Diagnosis not present

## 2020-05-26 DIAGNOSIS — Z1159 Encounter for screening for other viral diseases: Secondary | ICD-10-CM | POA: Diagnosis not present

## 2020-05-26 DIAGNOSIS — Z20828 Contact with and (suspected) exposure to other viral communicable diseases: Secondary | ICD-10-CM | POA: Diagnosis not present

## 2020-05-31 DIAGNOSIS — E890 Postprocedural hypothyroidism: Secondary | ICD-10-CM | POA: Diagnosis not present

## 2020-06-02 DIAGNOSIS — Z20828 Contact with and (suspected) exposure to other viral communicable diseases: Secondary | ICD-10-CM | POA: Diagnosis not present

## 2020-06-02 DIAGNOSIS — Z1159 Encounter for screening for other viral diseases: Secondary | ICD-10-CM | POA: Diagnosis not present

## 2020-06-07 ENCOUNTER — Other Ambulatory Visit: Payer: Self-pay | Admitting: Family Medicine

## 2020-06-07 DIAGNOSIS — E876 Hypokalemia: Secondary | ICD-10-CM

## 2020-06-09 DIAGNOSIS — Z20828 Contact with and (suspected) exposure to other viral communicable diseases: Secondary | ICD-10-CM | POA: Diagnosis not present

## 2020-06-09 DIAGNOSIS — Z1159 Encounter for screening for other viral diseases: Secondary | ICD-10-CM | POA: Diagnosis not present

## 2020-06-11 ENCOUNTER — Other Ambulatory Visit: Payer: Self-pay | Admitting: Family Medicine

## 2020-06-11 DIAGNOSIS — H6123 Impacted cerumen, bilateral: Secondary | ICD-10-CM | POA: Diagnosis not present

## 2020-06-11 DIAGNOSIS — Z1231 Encounter for screening mammogram for malignant neoplasm of breast: Secondary | ICD-10-CM

## 2020-06-22 ENCOUNTER — Other Ambulatory Visit: Payer: Self-pay | Admitting: Neurology

## 2020-06-23 DIAGNOSIS — Z20828 Contact with and (suspected) exposure to other viral communicable diseases: Secondary | ICD-10-CM | POA: Diagnosis not present

## 2020-06-23 DIAGNOSIS — Z1159 Encounter for screening for other viral diseases: Secondary | ICD-10-CM | POA: Diagnosis not present

## 2020-06-29 ENCOUNTER — Other Ambulatory Visit: Payer: Self-pay | Admitting: Family Medicine

## 2020-06-29 DIAGNOSIS — E876 Hypokalemia: Secondary | ICD-10-CM

## 2020-06-30 ENCOUNTER — Ambulatory Visit: Payer: Medicare Other | Admitting: Neurology

## 2020-06-30 DIAGNOSIS — Z1159 Encounter for screening for other viral diseases: Secondary | ICD-10-CM | POA: Diagnosis not present

## 2020-06-30 DIAGNOSIS — Z20828 Contact with and (suspected) exposure to other viral communicable diseases: Secondary | ICD-10-CM | POA: Diagnosis not present

## 2020-07-06 DIAGNOSIS — Z1159 Encounter for screening for other viral diseases: Secondary | ICD-10-CM | POA: Diagnosis not present

## 2020-07-06 DIAGNOSIS — Z20828 Contact with and (suspected) exposure to other viral communicable diseases: Secondary | ICD-10-CM | POA: Diagnosis not present

## 2020-07-09 DIAGNOSIS — Z20828 Contact with and (suspected) exposure to other viral communicable diseases: Secondary | ICD-10-CM | POA: Diagnosis not present

## 2020-07-09 DIAGNOSIS — Z1159 Encounter for screening for other viral diseases: Secondary | ICD-10-CM | POA: Diagnosis not present

## 2020-07-16 DIAGNOSIS — Z1159 Encounter for screening for other viral diseases: Secondary | ICD-10-CM | POA: Diagnosis not present

## 2020-07-16 DIAGNOSIS — Z20828 Contact with and (suspected) exposure to other viral communicable diseases: Secondary | ICD-10-CM | POA: Diagnosis not present

## 2020-07-20 DIAGNOSIS — Z1159 Encounter for screening for other viral diseases: Secondary | ICD-10-CM | POA: Diagnosis not present

## 2020-07-20 DIAGNOSIS — Z20828 Contact with and (suspected) exposure to other viral communicable diseases: Secondary | ICD-10-CM | POA: Diagnosis not present

## 2020-07-22 ENCOUNTER — Ambulatory Visit: Payer: Medicare Other

## 2020-07-23 DIAGNOSIS — Z1159 Encounter for screening for other viral diseases: Secondary | ICD-10-CM | POA: Diagnosis not present

## 2020-07-23 DIAGNOSIS — Z20828 Contact with and (suspected) exposure to other viral communicable diseases: Secondary | ICD-10-CM | POA: Diagnosis not present

## 2020-07-27 DIAGNOSIS — Z1159 Encounter for screening for other viral diseases: Secondary | ICD-10-CM | POA: Diagnosis not present

## 2020-07-27 DIAGNOSIS — Z20828 Contact with and (suspected) exposure to other viral communicable diseases: Secondary | ICD-10-CM | POA: Diagnosis not present

## 2020-07-28 ENCOUNTER — Other Ambulatory Visit: Payer: Medicare Other

## 2020-07-30 DIAGNOSIS — Z20828 Contact with and (suspected) exposure to other viral communicable diseases: Secondary | ICD-10-CM | POA: Diagnosis not present

## 2020-07-30 DIAGNOSIS — Z1159 Encounter for screening for other viral diseases: Secondary | ICD-10-CM | POA: Diagnosis not present

## 2020-08-03 ENCOUNTER — Other Ambulatory Visit: Payer: Self-pay

## 2020-08-03 ENCOUNTER — Other Ambulatory Visit: Payer: Medicare Other | Admitting: *Deleted

## 2020-08-03 DIAGNOSIS — E785 Hyperlipidemia, unspecified: Secondary | ICD-10-CM | POA: Diagnosis not present

## 2020-08-03 DIAGNOSIS — Z1159 Encounter for screening for other viral diseases: Secondary | ICD-10-CM | POA: Diagnosis not present

## 2020-08-03 DIAGNOSIS — E7800 Pure hypercholesterolemia, unspecified: Secondary | ICD-10-CM | POA: Diagnosis not present

## 2020-08-03 DIAGNOSIS — Z20828 Contact with and (suspected) exposure to other viral communicable diseases: Secondary | ICD-10-CM | POA: Diagnosis not present

## 2020-08-03 LAB — LIPID PANEL
Chol/HDL Ratio: 3.8 ratio (ref 0.0–4.4)
Cholesterol, Total: 156 mg/dL (ref 100–199)
HDL: 41 mg/dL (ref >39)
LDL Chol Calc (NIH): 83 mg/dL (ref 0–99)
Triglycerides: 185 mg/dL — ABNORMAL HIGH (ref 0–149)
VLDL Cholesterol Cal: 32 mg/dL (ref 5–40)

## 2020-08-03 LAB — HEPATIC FUNCTION PANEL
ALT: 37 IU/L — ABNORMAL HIGH (ref 0–32)
AST: 28 IU/L (ref 0–40)
Albumin: 4.3 g/dL (ref 3.6–4.6)
Alkaline Phosphatase: 92 IU/L (ref 44–121)
Bilirubin Total: 0.3 mg/dL (ref 0.0–1.2)
Bilirubin, Direct: <0.1 mg/dL (ref 0.00–0.40)
Total Protein: 6.8 g/dL (ref 6.0–8.5)

## 2020-08-04 ENCOUNTER — Telehealth: Payer: Self-pay

## 2020-08-04 ENCOUNTER — Other Ambulatory Visit: Payer: Self-pay | Admitting: Family Medicine

## 2020-08-04 DIAGNOSIS — E876 Hypokalemia: Secondary | ICD-10-CM

## 2020-08-04 DIAGNOSIS — I10 Essential (primary) hypertension: Secondary | ICD-10-CM

## 2020-08-04 DIAGNOSIS — E7800 Pure hypercholesterolemia, unspecified: Secondary | ICD-10-CM

## 2020-08-04 DIAGNOSIS — Q6101 Congenital single renal cyst: Secondary | ICD-10-CM

## 2020-08-04 DIAGNOSIS — E785 Hyperlipidemia, unspecified: Secondary | ICD-10-CM

## 2020-08-04 DIAGNOSIS — N281 Cyst of kidney, acquired: Secondary | ICD-10-CM

## 2020-08-04 NOTE — Telephone Encounter (Signed)
Spoke to pt's POA. Went over these labs, put in orders but she will call back next week to schedule lab appt.    LDL better at 83 but triglycerides still high at 185. Please limit simple sugars and sweets in diet. Liver function minimally up. No changes at this time. Repeat FLP and LFT's in 6 months

## 2020-08-05 ENCOUNTER — Other Ambulatory Visit: Payer: Self-pay | Admitting: Family Medicine

## 2020-08-05 DIAGNOSIS — E876 Hypokalemia: Secondary | ICD-10-CM

## 2020-08-06 DIAGNOSIS — Z1159 Encounter for screening for other viral diseases: Secondary | ICD-10-CM | POA: Diagnosis not present

## 2020-08-06 DIAGNOSIS — Z20828 Contact with and (suspected) exposure to other viral communicable diseases: Secondary | ICD-10-CM | POA: Diagnosis not present

## 2020-08-10 DIAGNOSIS — M25561 Pain in right knee: Secondary | ICD-10-CM | POA: Diagnosis not present

## 2020-08-10 DIAGNOSIS — M25461 Effusion, right knee: Secondary | ICD-10-CM | POA: Diagnosis not present

## 2020-08-10 DIAGNOSIS — Z1159 Encounter for screening for other viral diseases: Secondary | ICD-10-CM | POA: Diagnosis not present

## 2020-08-10 DIAGNOSIS — Z20828 Contact with and (suspected) exposure to other viral communicable diseases: Secondary | ICD-10-CM | POA: Diagnosis not present

## 2020-08-10 DIAGNOSIS — R600 Localized edema: Secondary | ICD-10-CM | POA: Diagnosis not present

## 2020-08-13 ENCOUNTER — Other Ambulatory Visit: Payer: Self-pay

## 2020-08-13 ENCOUNTER — Ambulatory Visit
Admission: RE | Admit: 2020-08-13 | Discharge: 2020-08-13 | Disposition: A | Discharge status: Home / Self Care | Payer: Medicare Other | Source: Ambulatory Visit | Attending: Family Medicine | Admitting: Family Medicine

## 2020-08-13 DIAGNOSIS — Z20828 Contact with and (suspected) exposure to other viral communicable diseases: Secondary | ICD-10-CM | POA: Diagnosis not present

## 2020-08-13 DIAGNOSIS — Z1159 Encounter for screening for other viral diseases: Secondary | ICD-10-CM | POA: Diagnosis not present

## 2020-08-13 DIAGNOSIS — Z1231 Encounter for screening mammogram for malignant neoplasm of breast: Secondary | ICD-10-CM | POA: Diagnosis not present

## 2020-08-17 DIAGNOSIS — Z1159 Encounter for screening for other viral diseases: Secondary | ICD-10-CM | POA: Diagnosis not present

## 2020-08-17 DIAGNOSIS — Z20828 Contact with and (suspected) exposure to other viral communicable diseases: Secondary | ICD-10-CM | POA: Diagnosis not present

## 2020-08-20 DIAGNOSIS — Z1159 Encounter for screening for other viral diseases: Secondary | ICD-10-CM | POA: Diagnosis not present

## 2020-08-20 DIAGNOSIS — Z23 Encounter for immunization: Secondary | ICD-10-CM | POA: Diagnosis not present

## 2020-08-20 DIAGNOSIS — Z20828 Contact with and (suspected) exposure to other viral communicable diseases: Secondary | ICD-10-CM | POA: Diagnosis not present

## 2020-08-24 DIAGNOSIS — Z20828 Contact with and (suspected) exposure to other viral communicable diseases: Secondary | ICD-10-CM | POA: Diagnosis not present

## 2020-08-24 DIAGNOSIS — Z1159 Encounter for screening for other viral diseases: Secondary | ICD-10-CM | POA: Diagnosis not present

## 2020-08-26 DIAGNOSIS — H1131 Conjunctival hemorrhage, right eye: Secondary | ICD-10-CM | POA: Diagnosis not present

## 2020-08-27 DIAGNOSIS — Z1159 Encounter for screening for other viral diseases: Secondary | ICD-10-CM | POA: Diagnosis not present

## 2020-08-27 DIAGNOSIS — Z20828 Contact with and (suspected) exposure to other viral communicable diseases: Secondary | ICD-10-CM | POA: Diagnosis not present

## 2020-08-31 DIAGNOSIS — Z1159 Encounter for screening for other viral diseases: Secondary | ICD-10-CM | POA: Diagnosis not present

## 2020-08-31 DIAGNOSIS — Z20828 Contact with and (suspected) exposure to other viral communicable diseases: Secondary | ICD-10-CM | POA: Diagnosis not present

## 2020-09-01 ENCOUNTER — Telehealth: Payer: Self-pay | Admitting: Family Medicine

## 2020-09-01 DIAGNOSIS — E876 Hypokalemia: Secondary | ICD-10-CM

## 2020-09-01 MED ORDER — POTASSIUM CHLORIDE ER 10 MEQ PO TBCR: EXTENDED_RELEASE_TABLET | ORAL | 0 refills | Status: DC

## 2020-09-01 NOTE — Telephone Encounter (Signed)
potassium chloride (KLOR-CON) 10 MEQ tablet [place], [place]  Phone:  [phone] Fax:  [phone]  Pt requesting a refill until she comes in for her appt on Friday said she can not do with the K

## 2020-09-01 NOTE — Telephone Encounter (Signed)
Rx done. 

## 2020-09-03 ENCOUNTER — Ambulatory Visit (INDEPENDENT_AMBULATORY_CARE_PROVIDER_SITE_OTHER): Payer: Medicare Other | Admitting: Family Medicine

## 2020-09-03 ENCOUNTER — Other Ambulatory Visit: Payer: Self-pay

## 2020-09-03 ENCOUNTER — Encounter: Payer: Self-pay | Admitting: Family Medicine

## 2020-09-03 VITALS — BP 120/68 | HR 74 | Temp 98.7°F | Ht 62.0 in | Wt 172.8 lb

## 2020-09-03 DIAGNOSIS — I10 Essential (primary) hypertension: Secondary | ICD-10-CM | POA: Diagnosis not present

## 2020-09-03 DIAGNOSIS — E538 Deficiency of other specified B group vitamins: Secondary | ICD-10-CM | POA: Diagnosis not present

## 2020-09-03 DIAGNOSIS — E7800 Pure hypercholesterolemia, unspecified: Secondary | ICD-10-CM | POA: Diagnosis not present

## 2020-09-03 DIAGNOSIS — M8580 Other specified disorders of bone density and structure, unspecified site: Secondary | ICD-10-CM | POA: Diagnosis not present

## 2020-09-03 DIAGNOSIS — E559 Vitamin D deficiency, unspecified: Secondary | ICD-10-CM | POA: Diagnosis not present

## 2020-09-03 DIAGNOSIS — R739 Hyperglycemia, unspecified: Secondary | ICD-10-CM | POA: Diagnosis not present

## 2020-09-03 DIAGNOSIS — E038 Other specified hypothyroidism: Secondary | ICD-10-CM | POA: Diagnosis not present

## 2020-09-03 DIAGNOSIS — Z1159 Encounter for screening for other viral diseases: Secondary | ICD-10-CM | POA: Diagnosis not present

## 2020-09-03 DIAGNOSIS — Z20828 Contact with and (suspected) exposure to other viral communicable diseases: Secondary | ICD-10-CM | POA: Diagnosis not present

## 2020-09-03 MED ORDER — VALACYCLOVIR HCL 500 MG PO TABS
500.0000 mg | ORAL_TABLET | Freq: Every day | ORAL | 3 refills | Status: DC
Start: 2020-09-03 — End: 2021-08-22

## 2020-09-03 NOTE — Progress Notes (Signed)
Felicia Kelly DOB: 1935-12-18 Encounter date: 09/03/2020  This is a 84 y.o. female who presents with Chief Complaint  Patient presents with  . Medication Refill    History of present illness: Memory loss/cognitive impairment: She had recently moved to [facility] living section.  She was in the emergency room back in May/2021 for a fall.  She still enjoys self at [facility]. Enjoys other people there. A lot of activities daily.   Hypertension: Hyzaar 100-12.5 mg daily. No issues with dizziness or light headedness.   Hypothyroid: Synthroid 50 mcg daily  Osteopenia: Last bone density was done 07/2018; this is stable from previous.  Vitamin D deficiency: Has been over a year since this is last checked and it was low last time. She is on vitamin D 1000 daily.   Hyperglycemia: has been eating more. We will recheck bloodwork today.  Hyperlipidemia: Pravastatin 40 mg daily, Zetia 10 mg daily Lipid Panel     Component Value Date/Time   CHOL 156 08/03/2020 1048   TRIG 185 (H) 08/03/2020 1048   HDL 41 08/03/2020 1048   CHOLHDL 3.8 08/03/2020 1048   CHOLHDL 5 06/24/2019 1112   VLDL 37.4 06/24/2019 1112   LDLCALC 83 08/03/2020 1048   LABVLDL 32 08/03/2020 1048    Been taking Valtrex regularly: yes. Prefers to stay on this medication.  Had outbreak on gluteus in the past.  Has not had outbreaks since being on Valtrex.  Allergies  Allergen Reactions  . Penicillins Swelling and Rash    ALL CILLINS  . Tetracycline Itching  . Atorvastatin Other (See Comments)    Leg cramps Leg cramps  . Bactrim [Sulfamethoxazole-Trimethoprim] Itching  . Celecoxib Other (See Comments)    Memory disturbance, slowed thinking Memory disturbance, slowed thinking  . Niacin Other (See Comments)    Caused elevated BP  . Niaspan [Niacin Er]     Elevated BP  . Rosuvastatin Other (See Comments)    Elevated CPK  . Simvastatin Other (See Comments)    Leg cramps Leg cramps  .  Tetracyclines & Related Itching   Current Meds  Medication Sig  . doxycycline (VIBRAMYCIN) 100 MG capsule Take 100 mg by mouth. As needed prior to dental procedures  . levothyroxine (SYNTHROID) 50 MCG tablet Take 1 tablet (50 mcg total) by mouth daily before breakfast.  . losartan-hydrochlorothiazide (HYZAAR) 100-12.5 MG tablet Take 1 tablet by mouth daily.  . Multiple Vitamins-Minerals (QC WOMENS DAILY MULTIVITAMIN) TABS Take 1 tablet by mouth daily.  . Omega-3 Fatty Acids (FISH OIL) 1000 MG CPDR Take 1,000 mg by mouth 2 (two) times daily.  . potassium chloride (KLOR-CON) 10 MEQ tablet TAKE 1 TABLET THREE TIMES WEEKLY.  . pravastatin (PRAVACHOL) 40 MG tablet Take 1 tablet (40 mg total) by mouth daily.  . rivastigmine (EXELON) 1.5 MG capsule TAKE (1) CAPSULE TWICE DAILY.  . valACYclovir (VALTREX) 500 MG tablet TAKE 1 TABLET ONCE DAILY.    Review of Systems  Constitutional: Negative for chills, fatigue and fever.  Respiratory: Negative for cough, chest tightness, shortness of breath and wheezing.   Cardiovascular: Negative for chest pain, palpitations and leg swelling.    Objective:  BP 120/68 (BP Location: Left Arm, Patient Position: Sitting, Cuff Size: Large)   Pulse 74   Temp 98.7 F (37.1 C) (Oral)   Ht 5\' 2"  (1.575 m)   Wt 172 lb 12.8 oz (78.4 kg)   SpO2 95%   BMI 31.61 kg/m   Weight: 172 lb 12.8 oz (  78.4 kg)   BP Readings from Last 3 Encounters:  09/03/20 120/68  02/19/20 127/63  11/04/19 112/60   Wt Readings from Last 3 Encounters:  09/03/20 172 lb 12.8 oz (78.4 kg)  02/18/20 162 lb 14.7 oz (73.9 kg)  11/04/19 163 lb (73.9 kg)    Physical Exam Constitutional:      General: She is not in acute distress.    Appearance: She is well-developed.  Cardiovascular:     Rate and Rhythm: Normal rate and regular rhythm.     Heart sounds: Normal heart sounds. No murmur heard.  No friction rub.  Pulmonary:     Effort: Pulmonary effort is normal. No respiratory distress.      Breath sounds: Normal breath sounds. No wheezing or rales.  Musculoskeletal:     Right lower leg: No edema.     Left lower leg: No edema.  Neurological:     Mental Status: She is alert and oriented to person, place, and time.  Psychiatric:        Mood and Affect: Mood normal.        Behavior: Behavior normal. Behavior is cooperative.        Thought Content: Thought content normal.        Cognition and Memory: Memory is impaired.     Assessment/Plan  1. Benign essential hypertension Well-controlled.  Continue current medications: Hyzaar 100-12.5 mg daily - CBC with Differential/Platelet; Future - Basic metabolic panel; Future - Basic metabolic panel - CBC with Differential/Platelet  2. Other specified hypothyroidism Thyroid has been stable on Synthroid 50 mcg daily.  We will recheck labs today. - TSH; Future - TSH  3. Osteopenia, unspecified location She has been taking vitamin D supplementation 1000 units a few days a week.  She is staying a little more active at [facility] now.  4. Hypercholesterolemia Has been good with current medications.  Pravastatin 40 mg daily, Zetia 10 mg daily.  5. Hyperglycemia She has been eating more 10 pound weight gain since her last visit.  She does admit to overeating sometimes and has more dessert now than she did in the past.  We will recheck blood sugar and make sure this is staying stable. - Hemoglobin A1c; Future - Hemoglobin A1c  6. Vitamin D deficiency Has been stable with supplementation. - VITAMIN D 25 Hydroxy (Vit-D Deficiency, Fractures); Future - VITAMIN D 25 Hydroxy (Vit-D Deficiency, Fractures)  7. B12 deficiency Levels have been stable w supplementation. - Vitamin B12; Future - Vitamin B12  Return in about 6 months (around 03/03/2021) for Chronic condition visit.      Micheline Rough, MD

## 2020-09-04 LAB — CBC WITH DIFFERENTIAL/PLATELET
Absolute Monocytes: 499 cells/uL (ref 200–950)
Basophils Absolute: 39 cells/uL (ref 0–200)
Basophils Relative: 0.5 %
Eosinophils Absolute: 328 cells/uL (ref 15–500)
Eosinophils Relative: 4.2 %
HCT: 38.8 % (ref 35.0–45.0)
Hemoglobin: 13 g/dL (ref 11.7–15.5)
Lymphs Abs: 2543 cells/uL (ref 850–3900)
MCH: 29.6 pg (ref 27.0–33.0)
MCHC: 33.5 g/dL (ref 32.0–36.0)
MCV: 88.4 fL (ref 80.0–100.0)
MPV: 10.2 fL (ref 7.5–12.5)
Monocytes Relative: 6.4 %
Neutro Abs: 4391 cells/uL (ref 1500–7800)
Neutrophils Relative %: 56.3 %
Platelets: 296 10*3/uL (ref 140–400)
RBC: 4.39 10*6/uL (ref 3.80–5.10)
RDW: 13.2 % (ref 11.0–15.0)
Total Lymphocyte: 32.6 %
WBC: 7.8 10*3/uL (ref 3.8–10.8)

## 2020-09-04 LAB — TSH: TSH: 3.04 mIU/L (ref 0.40–4.50)

## 2020-09-04 LAB — BASIC METABOLIC PANEL
BUN: 21 mg/dL (ref 7–25)
CO2: 23 mmol/L (ref 20–32)
Calcium: 9.7 mg/dL (ref 8.6–10.4)
Chloride: 104 mmol/L (ref 98–110)
Creat: 0.8 mg/dL (ref 0.60–0.88)
Glucose, Bld: 102 mg/dL — ABNORMAL HIGH (ref 65–99)
Potassium: 3.7 mmol/L (ref 3.5–5.3)
Sodium: 142 mmol/L (ref 135–146)

## 2020-09-04 LAB — VITAMIN D 25 HYDROXY (VIT D DEFICIENCY, FRACTURES): Vit D, 25-Hydroxy: 42 ng/mL (ref 30–100)

## 2020-09-04 LAB — HEMOGLOBIN A1C
Hgb A1c MFr Bld: 7.1 % of total Hgb — ABNORMAL HIGH (ref <5.7)
Mean Plasma Glucose: 157 (calc)
eAG (mmol/L): 8.7 (calc)

## 2020-09-04 LAB — VITAMIN B12: Vitamin B-12: 765 pg/mL (ref 200–1100)

## 2020-09-06 ENCOUNTER — Other Ambulatory Visit: Payer: Self-pay | Admitting: Family Medicine

## 2020-09-06 MED ORDER — METFORMIN HCL ER 500 MG PO TB24: 500.0000 mg | ORAL_TABLET | Freq: Every day | ORAL | 1 refills | Status: DC

## 2020-09-06 NOTE — Addendum Note (Signed)
Addended by: Agnes Lawrence on: 09/06/2020 02:06 PM   Modules accepted: Orders

## 2020-09-07 DIAGNOSIS — Z20828 Contact with and (suspected) exposure to other viral communicable diseases: Secondary | ICD-10-CM | POA: Diagnosis not present

## 2020-09-07 DIAGNOSIS — Z1159 Encounter for screening for other viral diseases: Secondary | ICD-10-CM | POA: Diagnosis not present

## 2020-09-14 DIAGNOSIS — Z1159 Encounter for screening for other viral diseases: Secondary | ICD-10-CM | POA: Diagnosis not present

## 2020-09-14 DIAGNOSIS — Z20828 Contact with and (suspected) exposure to other viral communicable diseases: Secondary | ICD-10-CM | POA: Diagnosis not present

## 2020-09-21 DIAGNOSIS — Z1159 Encounter for screening for other viral diseases: Secondary | ICD-10-CM | POA: Diagnosis not present

## 2020-09-21 DIAGNOSIS — Z20828 Contact with and (suspected) exposure to other viral communicable diseases: Secondary | ICD-10-CM | POA: Diagnosis not present

## 2020-09-24 DIAGNOSIS — Z1159 Encounter for screening for other viral diseases: Secondary | ICD-10-CM | POA: Diagnosis not present

## 2020-09-24 DIAGNOSIS — Z20828 Contact with and (suspected) exposure to other viral communicable diseases: Secondary | ICD-10-CM | POA: Diagnosis not present

## 2020-09-28 DIAGNOSIS — Z1159 Encounter for screening for other viral diseases: Secondary | ICD-10-CM | POA: Diagnosis not present

## 2020-09-28 DIAGNOSIS — Z20828 Contact with and (suspected) exposure to other viral communicable diseases: Secondary | ICD-10-CM | POA: Diagnosis not present

## 2020-10-01 DIAGNOSIS — Z20828 Contact with and (suspected) exposure to other viral communicable diseases: Secondary | ICD-10-CM | POA: Diagnosis not present

## 2020-10-01 DIAGNOSIS — Z1159 Encounter for screening for other viral diseases: Secondary | ICD-10-CM | POA: Diagnosis not present

## 2020-10-04 ENCOUNTER — Other Ambulatory Visit: Payer: Self-pay | Admitting: Family Medicine

## 2020-10-04 DIAGNOSIS — E876 Hypokalemia: Secondary | ICD-10-CM

## 2020-10-05 DIAGNOSIS — Z1159 Encounter for screening for other viral diseases: Secondary | ICD-10-CM | POA: Diagnosis not present

## 2020-10-05 DIAGNOSIS — Z20828 Contact with and (suspected) exposure to other viral communicable diseases: Secondary | ICD-10-CM | POA: Diagnosis not present

## 2020-10-12 DIAGNOSIS — Z20828 Contact with and (suspected) exposure to other viral communicable diseases: Secondary | ICD-10-CM | POA: Diagnosis not present

## 2020-10-12 DIAGNOSIS — Z1159 Encounter for screening for other viral diseases: Secondary | ICD-10-CM | POA: Diagnosis not present

## 2020-10-15 DIAGNOSIS — Z20828 Contact with and (suspected) exposure to other viral communicable diseases: Secondary | ICD-10-CM | POA: Diagnosis not present

## 2020-10-15 DIAGNOSIS — Z1159 Encounter for screening for other viral diseases: Secondary | ICD-10-CM | POA: Diagnosis not present

## 2020-10-19 DIAGNOSIS — Z20828 Contact with and (suspected) exposure to other viral communicable diseases: Secondary | ICD-10-CM | POA: Diagnosis not present

## 2020-10-19 DIAGNOSIS — Z1159 Encounter for screening for other viral diseases: Secondary | ICD-10-CM | POA: Diagnosis not present

## 2020-10-21 ENCOUNTER — Encounter: Payer: Self-pay | Admitting: Physician Assistant

## 2020-10-21 NOTE — Progress Notes (Deleted)
Cardiology Office Note    Date:  10/21/2020   ID:  [place], [name] 06-19-1936, MRN [id number]  PCP:  Caren Macadam, MD  Cardiologist:  Ena Dawley, MD  Electrophysiologist:  None   Chief Complaint: 1 year f/u carotid disease and cardiac family history  History of Present Illness:   Felicia Kelly is a 85 y.o. female with history of mild carotid artery disease, HTN, HLD, aortic atherosclerosis, pulmonary nodules (followed by PCP), and family history of CAD who presents for routine follow-up, last OV 10/2019. Exercise NST 09/2016 was normal, EF 61%. Carotid duplex 01/2020 1-39% BICA.   Mild carotid artery disease and atherosclerosis of the aorta Essential HTN Hyperlipidemia Pulmonary nodules   Pulmonary nodules 2019 ->CT f/u 12-18 months considered in low risk patients, defer to [facility] care  Labwork independently reviewed: 08/2020 CBC wnl, A1c 7.1, TSH wnl, K 3.7, Cr 0.80 07/2020 LFTs ok except ALT 37 (Michele recommended repeat in 6 months), LDL 83  Past Medical History:  Diagnosis Date  . Aortic atherosclerosis ([place])    a. noted on CT 01/2018.  . Arthritis   . Colonic polyp   . Coronary atherosclerosis    a. noted on CT 01/2018.  Marland Kitchen Genital herpes   . Hyperlipidemia   . Hypertension   . Mild carotid artery disease ([place])   . Osteoporosis   . Overdose of cardiac medication   . Pulmonary nodules    a. folloewd by PCP by serial Ct.  . Thyroid disease    seeing Dr. Buddy Duty  . Tremor     Past Surgical History:  Procedure Laterality Date  . ABDOMINAL HYSTERECTOMY  1988   no cancer - reports total with bilat oophorectomy  . APPENDECTOMY  1988  . BREAST CYST EXCISION Bilateral   . BREAST SURGERY  1985   biopsy  . FOOT SURGERY     multiple sugeries, remote  . TOTAL HIP ARTHROPLASTY Bilateral     Current Medications: No outpatient medications have been marked as taking for the 10/22/20 encounter (Appointment) with Charlie Pitter, PA-C.   ***    Allergies:   Penicillins, Tetracycline, Atorvastatin, Bactrim [sulfamethoxazole-trimethoprim], Celecoxib, Niacin, Niaspan [niacin er], Rosuvastatin, Simvastatin, and Tetracyclines & related   Social History   Socioeconomic History  . Marital status: Widowed    Spouse name: Not on file  . Number of children: Not on file  . Years of education: Not on file  . Highest education level: Not on file  Occupational History  . Not on file  Tobacco Use  . Smoking status: Former Smoker    Quit date: 10/16/1978    Years since quitting: 42.0  . Smokeless tobacco: Never Used  . Tobacco comment: 1963 when her father died;  smoked for a brief period of time  Substance and Sexual Activity  . Alcohol use: No    Alcohol/week: 0.0 standard drinks  . Drug use: No  . Sexual activity: Not on file  Other Topics Concern  . Not on file  Social History Narrative   Work or School: none      Home Situation: widower, lives alone      Spiritual Beliefs: Christian      Lifestyle: no regular exercise; diet is ok      Social Determinants of Radio broadcast assistant Strain: Not on file  Food Insecurity: Not on file  Transportation Needs: Not on file  Physical Activity: Not on file  Stress: Not on file  Social Connections: Not on file     Family History:  The patient's ***family history includes Arthritis in her mother; Diabetes in her mother; Heart attack in her brother; Heart attack (age of onset: 47) in her sister; Heart disease in her father, maternal grandfather, and mother; Hyperlipidemia in her mother; Hypertension in her mother; Liver disease in her brother; Pancreatic cancer in her father. There is no history of Breast cancer.  ROS:   Please see the history of present illness. Otherwise, review of systems is positive for ***.  All other systems are reviewed and otherwise negative.    EKGs/Labs/Other Studies Reviewed:    Studies reviewed are outlined and summarized above. Reports  included below if pertinent.  Carotid study 01/2020 Summary:  Right Carotid: Velocities in the right ICA are consistent with a 1-39%  stenosis.   Left Carotid: Velocities in the left ICA are consistent with a 1-39%  stenosis.   Vertebrals: Bilateral vertebral arteries demonstrate antegrade flow.  Subclavians: Normal flow hemodynamics were seen in bilateral subclavian        arteries.   *See table(s) above for measurements and observations.      Electronically signed by Julien Nordmann MD on 02/02/2020 at 6:36:41 PM.   Exercise NST 09/2016  Nuclear stress EF: 61%.  There was no ST segment deviation noted during stress.  Blood pressure demonstrated a normal response to exercise.  The study is normal.  This is a low risk study.  The left ventricular ejection fraction is normal (55-65%).   Normal exercise nuclear stress test with no evidence of prior infarct or ischemia.  Normal exercise capacity. Normal BP response to stress.       EKG:  EKG is ordered today, personally reviewed, demonstrating ***  Recent Labs: 08/03/2020: ALT 37 09/03/2020: BUN 21; Creat 0.80; Hemoglobin 13.0; Platelets 296; Potassium 3.7; Sodium 142; TSH 3.04  Recent Lipid Panel    Component Value Date/Time   CHOL 156 08/03/2020 1048   TRIG 185 (H) 08/03/2020 1048   HDL 41 08/03/2020 1048   CHOLHDL 3.8 08/03/2020 1048   CHOLHDL 5 06/24/2019 1112   VLDL 37.4 06/24/2019 1112   LDLCALC 83 08/03/2020 1048    PHYSICAL EXAM:    VS:  There were no vitals taken for this visit.  BMI: There is no height or weight on file to calculate BMI.  GEN: Well nourished, well developed, in no acute distress HEENT: normocephalic, atraumatic Neck: no JVD, carotid bruits, or masses Cardiac: ***RRR; no murmurs, rubs, or gallops, no edema  Respiratory:  clear to auscultation bilaterally, normal work of breathing GI: soft, nontender, nondistended, + BS MS: no deformity or atrophy Skin: warm and dry, no  rash Neuro:  Alert and Oriented x 3, Strength and sensation are intact, follows commands Psych: euthymic mood, full affect  Wt Readings from Last 3 Encounters:  09/03/20 172 lb 12.8 oz (78.4 kg)  02/18/20 162 lb 14.7 oz (73.9 kg)  11/04/19 163 lb (73.9 kg)     ASSESSMENT & PLAN:   1. ***  Disposition: F/u with ***   Medication Adjustments/Labs and Tests Ordered: Current medicines are reviewed at length with the patient today.  Concerns regarding medicines are outlined above. Medication changes, Labs and Tests ordered today are summarized above and listed in the Patient Instructions accessible in Encounters.   Signed, Laurann Montana, PA-C  10/21/2020 8:46 AM    [facility] [address] [place], [place], [place]  [address] Phone: [phone])  [phone]; Fax: [phone]

## 2020-10-22 ENCOUNTER — Telehealth: Payer: Self-pay | Admitting: Family Medicine

## 2020-10-22 ENCOUNTER — Ambulatory Visit: Payer: Medicare Other | Admitting: Physician Assistant

## 2020-10-22 DIAGNOSIS — Z1159 Encounter for screening for other viral diseases: Secondary | ICD-10-CM | POA: Diagnosis not present

## 2020-10-22 DIAGNOSIS — Z20828 Contact with and (suspected) exposure to other viral communicable diseases: Secondary | ICD-10-CM | POA: Diagnosis not present

## 2020-10-22 NOTE — Telephone Encounter (Signed)
[  Pt sister call an stated she want Joann to call her back at [phone].

## 2020-10-22 NOTE — Telephone Encounter (Signed)
Spoke with Mrs Loma Sousa and informed her the pt is due for labs in February and an appt was scheduled.  She asked if the order could be placed for another bone density as she called for an appt and [facility] told [facility] she does need an order. I also advised Mrs Demetrius Charity is on the pts medication list, was prescribed by Margarite Gouge in cardiology and to contact their office with questions.  Message sent to PCP.

## 2020-10-27 ENCOUNTER — Other Ambulatory Visit: Payer: Self-pay | Admitting: Family Medicine

## 2020-10-27 DIAGNOSIS — E2839 Other primary ovarian failure: Secondary | ICD-10-CM

## 2020-10-27 NOTE — Telephone Encounter (Signed)
Noted  

## 2020-10-27 NOTE — Telephone Encounter (Signed)
Bone density ordered to [facility].

## 2020-10-28 DIAGNOSIS — Z20828 Contact with and (suspected) exposure to other viral communicable diseases: Secondary | ICD-10-CM | POA: Diagnosis not present

## 2020-11-01 ENCOUNTER — Other Ambulatory Visit: Payer: Self-pay | Admitting: Family Medicine

## 2020-11-01 DIAGNOSIS — E876 Hypokalemia: Secondary | ICD-10-CM

## 2020-11-02 DIAGNOSIS — Z20828 Contact with and (suspected) exposure to other viral communicable diseases: Secondary | ICD-10-CM | POA: Diagnosis not present

## 2020-11-04 DIAGNOSIS — Z20828 Contact with and (suspected) exposure to other viral communicable diseases: Secondary | ICD-10-CM | POA: Diagnosis not present

## 2020-11-08 DIAGNOSIS — Z20828 Contact with and (suspected) exposure to other viral communicable diseases: Secondary | ICD-10-CM | POA: Diagnosis not present

## 2020-11-11 DIAGNOSIS — Z20828 Contact with and (suspected) exposure to other viral communicable diseases: Secondary | ICD-10-CM | POA: Diagnosis not present

## 2020-11-15 DIAGNOSIS — Z20828 Contact with and (suspected) exposure to other viral communicable diseases: Secondary | ICD-10-CM | POA: Diagnosis not present

## 2020-11-15 NOTE — Progress Notes (Signed)
CARDIOLOGY OFFICE NOTE  Date:  11/29/2020    Raj Janus Date of Birth: 05-25-1936 Medical Record #[id number]  PCP:  Caren Macadam, MD  Cardiologist:  [name]   Chief Complaint  Patient presents with  . Follow-up    Seen for Dr. [name]    History of Present Illness: Zacaria Pousson is a 85 y.o. female who presents today for a follow up visit. Seen for Dr. [name].   She has a history of known mild carotid disease, HTN, HLD, + FH for early CAD - she is 85 years old now.   Last seen a little over a year ago by Estella Husk, PA - last seen in 05/2017 by Dr. [name]. Doing well. No regular exercise. Lives at [facility] in independent living.   Comes in today. Here with her sister Joaquim Lai who is her POA. No chest pain. Not short of breath. She continues to live at [facility] - enjoys that.  Doing well. Walks in the facility. BP is good. Labs from November noted. Last carotid was from 01/2020. Overall, no real concerns. Sister feels she is doing well.   Past Medical History:  Diagnosis Date  . Aortic atherosclerosis ([place])    a. noted on CT 01/2018.  . Arthritis   . Colonic polyp   . Coronary atherosclerosis    a. noted on CT 01/2018.  Marland Kitchen Genital herpes   . Hyperlipidemia   . Hypertension   . Mild carotid artery disease ([place])   . Osteoporosis   . Overdose of cardiac medication   . Pulmonary nodules    a. followed by PCP by serial Ct.  . Thyroid disease    seeing Dr. Buddy Duty  . Tremor     Past Surgical History:  Procedure Laterality Date  . ABDOMINAL HYSTERECTOMY  1988   no cancer - reports total with bilat oophorectomy  . APPENDECTOMY  1988  . BREAST CYST EXCISION Bilateral   . BREAST SURGERY  1985   biopsy  . FOOT SURGERY     multiple sugeries, remote  . TOTAL HIP ARTHROPLASTY Bilateral      Medications: Current Meds  Medication Sig  . aspirin EC 81 MG tablet Take 81 mg by mouth every evening. Swallow whole.  . Cholecalciferol  (VITAMIN D3 PO) Take 1,000 Units by mouth 3 (three) times a week.  . cyanocobalamin 100 MCG tablet Take by mouth.  . levothyroxine (SYNTHROID) 50 MCG tablet Take 50 mcg by mouth daily before breakfast. Taking 2 tablets on Sunday  . losartan-hydrochlorothiazide (HYZAAR) 100-12.5 MG tablet TAKE 1 TABLET ONCE DAILY.  . Magnesium 250 MG TABS Take by mouth.  . MELATONIN PO Take by mouth every evening.  Marland Kitchen METAMUCIL FIBER PO Take by mouth every evening.  . metFORMIN (GLUCOPHAGE XR) 500 MG 24 hr tablet Take 1 tablet (500 mg total) by mouth daily with breakfast.  . Multiple Vitamin (MULTIVITAMIN) tablet Take 1 tablet by mouth daily.  Marland Kitchen OVER THE COUNTER MEDICATION Occuvite-every morning  . potassium chloride (KLOR-CON) 10 MEQ tablet TAKE 1 TABLET THREE TIMES WEEKLY.  . pravastatin (PRAVACHOL) 40 MG tablet Take 1 tablet (40 mg total) by mouth daily.  . Probiotic Product (PROBIOTIC PO) Take by mouth every evening.  . rivastigmine (EXELON) 1.5 MG capsule TAKE (1) CAPSULE TWICE DAILY.  . valACYclovir (VALTREX) 500 MG tablet Take 1 tablet (500 mg total) by mouth daily.     Allergies: Allergies  Allergen Reactions  . Penicillins Swelling  and Rash    ALL CILLINS  . Tetracycline Itching  . Atorvastatin Other (See Comments)    Leg cramps Leg cramps  . Bactrim [Sulfamethoxazole-Trimethoprim] Itching  . Celecoxib Other (See Comments)    Memory disturbance, slowed thinking Memory disturbance, slowed thinking  . Niacin Other (See Comments)    Caused elevated BP  . Niaspan [Niacin Er]     Elevated BP  . Rosuvastatin Other (See Comments)    Elevated CPK  . Simvastatin Other (See Comments)    Leg cramps Leg cramps  . Tetracyclines & Related Itching    Social History: The patient  reports that she quit smoking about 42 years ago. She has never used smokeless tobacco. She reports that she does not drink alcohol and does not use drugs.   Family History: The patient's family history includes  Arthritis in her mother; Diabetes in her mother; Heart attack in her brother; Heart attack (age of onset: 9) in her sister; Heart disease in her father, maternal grandfather, and mother; Hyperlipidemia in her mother; Hypertension in her mother; Liver disease in her brother; Pancreatic cancer in her father.   Review of Systems: Please see the history of present illness.   All other systems are reviewed and negative.   Physical Exam: VS:  BP 120/72   Pulse 79   Ht 5\' 2"  (1.575 m)   Wt 168 lb 3.2 oz (76.3 kg)   SpO2 97%   BMI 30.76 kg/m  .  BMI Body mass index is 30.76 kg/m.  Wt Readings from Last 3 Encounters:  11/29/20 168 lb 3.2 oz (76.3 kg)  09/03/20 172 lb 12.8 oz (78.4 kg)  02/18/20 162 lb 14.7 oz (73.9 kg)    General: Alert and in no acute distress.  Cardiac: Regular rate and rhythm. No murmurs, rubs, or gallops. No edema.  Respiratory:  Lungs are clear to auscultation bilaterally with normal work of breathing.  GI: Soft and nontender.  MS: No deformity or atrophy. Gait and ROM intact.  Skin: Warm and dry. Color is normal.  Neuro:  Strength and sensation are intact and no gross focal deficits noted.  Psych: Alert, appropriate and with normal affect.   LABORATORY DATA:  EKG:  EKG is ordered today.  Personally reviewed by me. This demonstrates NSR.  Lab Results  Component Value Date   WBC 7.8 09/03/2020   HGB 13.0 09/03/2020   HCT 38.8 09/03/2020   PLT 296 09/03/2020   GLUCOSE 102 (H) 09/03/2020   CHOL 156 08/03/2020   TRIG 185 (H) 08/03/2020   HDL 41 08/03/2020   LDLCALC 83 08/03/2020   ALT 37 (H) 08/03/2020   AST 28 08/03/2020   NA 142 09/03/2020   K 3.7 09/03/2020   CL 104 09/03/2020   CREATININE 0.80 09/03/2020   BUN 21 09/03/2020   CO2 23 09/03/2020   TSH 3.04 09/03/2020   HGBA1C 7.1 (H) 09/03/2020     BNP (last 3 results) No results for input(s): BNP in the last 8760 hours.  ProBNP (last 3 results) No results for input(s): PROBNP in the last  8760 hours.   Other Studies Reviewed Today:  Carotid Doppler Summary 01/2020:  Right Carotid: Velocities in the right ICA are consistent with a 1-39%  stenosis.   Left Carotid: Velocities in the left ICA are consistent with a 1-39%  stenosis.   Vertebrals: Bilateral vertebral arteries demonstrate antegrade flow.  Subclavians: Normal flow hemodynamics were seen in bilateral subclavian  arteries.   *See  table(s) above for measurements and observations.   Electronically signed by Ida Rogue MD on 02/02/2020 at 6:36:41 PM.        NST 2017Study Highlights      Nuclear stress EF: 61%.  There was no ST segment deviation noted during stress.  Blood pressure demonstrated a normal response to exercise.  The study is normal.  This is a low risk study.  The left ventricular ejection fraction is normal (55-65%).   Normal exercise nuclear stress test with no evidence of prior infarct or ischemia.  Normal exercise capacity. Normal BP response to stress.            ASSESSMENT & PLAN:     1. HTN - BP is fine - no changes made today.   2. HLD - on statin - labs from November noted.   3. Mild carotid disease - just checked last year - would follow.   Current medicines are reviewed with the patient today.  The patient does not have concerns regarding medicines other than what has been noted above.  The following changes have been made:  See above.  Labs/ tests ordered today include:    Orders Placed This Encounter  Procedures  . EKG 12-Lead     Disposition:   FU with Dr. Johney Frame in one year. They are aware that Dr. [name] is leaving in April. No change with current regimen.    Patient is agreeable to this plan and will call if any problems develop in the interim.   SignedTruitt Merle, NP  11/29/2020 2:33 PM  [facility] [address] [place], [place]  [address] Phone: [phone] Fax: [phone]

## 2020-11-18 DIAGNOSIS — Z20828 Contact with and (suspected) exposure to other viral communicable diseases: Secondary | ICD-10-CM | POA: Diagnosis not present

## 2020-11-22 DIAGNOSIS — Z20828 Contact with and (suspected) exposure to other viral communicable diseases: Secondary | ICD-10-CM | POA: Diagnosis not present

## 2020-11-22 NOTE — Addendum Note (Signed)
Addended by: Marrion Coy on: 11/22/2020 10:24 AM   Modules accepted: Orders

## 2020-11-25 DIAGNOSIS — Z20828 Contact with and (suspected) exposure to other viral communicable diseases: Secondary | ICD-10-CM | POA: Diagnosis not present

## 2020-11-29 ENCOUNTER — Ambulatory Visit (INDEPENDENT_AMBULATORY_CARE_PROVIDER_SITE_OTHER): Payer: Medicare Other | Admitting: Nurse Practitioner

## 2020-11-29 ENCOUNTER — Other Ambulatory Visit: Payer: Self-pay

## 2020-11-29 ENCOUNTER — Encounter: Payer: Self-pay | Admitting: Nurse Practitioner

## 2020-11-29 VITALS — BP 120/72 | HR 79 | Ht 62.0 in | Wt 168.2 lb

## 2020-11-29 DIAGNOSIS — E785 Hyperlipidemia, unspecified: Secondary | ICD-10-CM | POA: Diagnosis not present

## 2020-11-29 DIAGNOSIS — E7800 Pure hypercholesterolemia, unspecified: Secondary | ICD-10-CM

## 2020-11-29 DIAGNOSIS — I10 Essential (primary) hypertension: Secondary | ICD-10-CM | POA: Diagnosis not present

## 2020-11-29 DIAGNOSIS — Z20828 Contact with and (suspected) exposure to other viral communicable diseases: Secondary | ICD-10-CM | POA: Diagnosis not present

## 2020-11-29 NOTE — Patient Instructions (Addendum)
After Visit Summary:  We will be checking the following labs today - NONE   Medication Instructions:    Continue with your current medicines.    If you need a refill on your cardiac medications before your next appointment, please call your pharmacy.     Testing/Procedures To Be Arranged:  N/A  Follow-Up:   See Dr. Pemberton in one year.  You will receive a reminder letter in the mail two months in advance. If you don't receive a letter, please call our office to schedule the follow-up appointment.     At [facility], you and your health needs are our priority.  As part of our continuing mission to provide you with exceptional heart care, we have created designated Provider Care Teams.  These Care Teams include your primary Cardiologist (physician) and Advanced Practice Providers (APPs -  Physician Assistants and Nurse Practitioners) who all work together to provide you with the care you need, when you need it.  Special Instructions:  . Stay safe, wash your hands for at least 20 seconds and wear a mask when needed.  . It was good to talk with you today.    Call the [facility] office at [phone] if you have any questions, problems or concerns.       

## 2020-12-02 DIAGNOSIS — Z20828 Contact with and (suspected) exposure to other viral communicable diseases: Secondary | ICD-10-CM | POA: Diagnosis not present

## 2020-12-03 ENCOUNTER — Other Ambulatory Visit: Payer: Self-pay

## 2020-12-06 ENCOUNTER — Other Ambulatory Visit: Payer: Self-pay

## 2020-12-06 ENCOUNTER — Other Ambulatory Visit (INDEPENDENT_AMBULATORY_CARE_PROVIDER_SITE_OTHER): Payer: Medicare Other

## 2020-12-06 DIAGNOSIS — Z20828 Contact with and (suspected) exposure to other viral communicable diseases: Secondary | ICD-10-CM | POA: Diagnosis not present

## 2020-12-06 DIAGNOSIS — R739 Hyperglycemia, unspecified: Secondary | ICD-10-CM

## 2020-12-06 LAB — HEMOGLOBIN A1C: Hgb A1c MFr Bld: 6.7 % — ABNORMAL HIGH (ref 4.6–6.5)

## 2020-12-09 DIAGNOSIS — Z20828 Contact with and (suspected) exposure to other viral communicable diseases: Secondary | ICD-10-CM | POA: Diagnosis not present

## 2020-12-13 DIAGNOSIS — Z20828 Contact with and (suspected) exposure to other viral communicable diseases: Secondary | ICD-10-CM | POA: Diagnosis not present

## 2020-12-13 DIAGNOSIS — H6123 Impacted cerumen, bilateral: Secondary | ICD-10-CM | POA: Diagnosis not present

## 2020-12-14 DIAGNOSIS — H35363 Drusen (degenerative) of macula, bilateral: Secondary | ICD-10-CM | POA: Diagnosis not present

## 2020-12-14 DIAGNOSIS — H35033 Hypertensive retinopathy, bilateral: Secondary | ICD-10-CM | POA: Diagnosis not present

## 2020-12-14 DIAGNOSIS — Z961 Presence of intraocular lens: Secondary | ICD-10-CM | POA: Diagnosis not present

## 2020-12-14 DIAGNOSIS — E119 Type 2 diabetes mellitus without complications: Secondary | ICD-10-CM | POA: Diagnosis not present

## 2020-12-14 LAB — HM DIABETES EYE EXAM

## 2020-12-16 DIAGNOSIS — Z20828 Contact with and (suspected) exposure to other viral communicable diseases: Secondary | ICD-10-CM | POA: Diagnosis not present

## 2020-12-20 ENCOUNTER — Encounter: Payer: Self-pay | Admitting: Family Medicine

## 2020-12-20 DIAGNOSIS — Z20828 Contact with and (suspected) exposure to other viral communicable diseases: Secondary | ICD-10-CM | POA: Diagnosis not present

## 2020-12-22 ENCOUNTER — Other Ambulatory Visit: Payer: Self-pay | Admitting: Family Medicine

## 2020-12-27 DIAGNOSIS — Z20828 Contact with and (suspected) exposure to other viral communicable diseases: Secondary | ICD-10-CM | POA: Diagnosis not present

## 2021-01-03 DIAGNOSIS — Z20828 Contact with and (suspected) exposure to other viral communicable diseases: Secondary | ICD-10-CM | POA: Diagnosis not present

## 2021-01-10 DIAGNOSIS — Z20828 Contact with and (suspected) exposure to other viral communicable diseases: Secondary | ICD-10-CM | POA: Diagnosis not present

## 2021-01-11 ENCOUNTER — Other Ambulatory Visit: Payer: Self-pay

## 2021-01-11 ENCOUNTER — Ambulatory Visit (INDEPENDENT_AMBULATORY_CARE_PROVIDER_SITE_OTHER): Payer: Medicare Other | Admitting: Neurology

## 2021-01-11 ENCOUNTER — Encounter: Payer: Self-pay | Admitting: Neurology

## 2021-01-11 VITALS — BP 115/70 | HR 81 | Ht 62.0 in | Wt 168.6 lb

## 2021-01-11 DIAGNOSIS — F03A0 Unspecified dementia, mild, without behavioral disturbance, psychotic disturbance, mood disturbance, and anxiety: Secondary | ICD-10-CM

## 2021-01-11 DIAGNOSIS — F0390 Unspecified dementia without behavioral disturbance: Secondary | ICD-10-CM

## 2021-01-11 MED ORDER — RIVASTIGMINE TARTRATE 3 MG PO CAPS: 3.0000 mg | ORAL_CAPSULE | Freq: Two times a day (BID) | ORAL | 3 refills | Status: DC

## 2021-01-11 NOTE — Progress Notes (Signed)
Patient was seen, evaluated, and treatment plan was discussed with the Advanced Practice Provider.  I have also reviewed the orders written for this patient which were under my direction. I agree with the findings and the plan of care as documented by the Advanced Practice Provider.  I reviewed brain MRI findings with the patient and her sister. She does not have any side effects on Rivastigmine 1.5mg  BID, discussed increasing dose to 3mg  BID. Mood much improved compared to prior visit, which sister confirms. No hallucinations or paranoia. Encouraged to continue control of vascular risk factors, physical exercise, and brain stimulation exercises.    The duration of this appointment visit was 50 minutes of face-to-face time with the patient.  Greater than 50% of this time was spent in counseling, explanation of diagnosis, planning of further management, and coordination of care.

## 2021-01-11 NOTE — Patient Instructions (Signed)
Good to see you!  1. Increase Rivastigmine to 3mg  twice a day  2. Continue with physical exercise, brain stimulation exercises, control of blood pressure, cholesterol, sugar levels  3. Follow-up in 6 months, call for any changes   FALL PRECAUTIONS: Be cautious when walking. Scan the area for obstacles that may increase the risk of trips and falls. When getting up in the mornings, sit up at the edge of the bed for a few minutes before getting out of bed. Consider elevating the bed at the head end to avoid drop of blood pressure when getting up. Walk always in a well-lit room (use night lights in the walls). Avoid area rugs or power cords from appliances in the middle of the walkways. Use a walker or a cane if necessary and consider physical therapy for balance exercise. Get your eyesight checked regularly.   HOME SAFETY: Consider the safety of the kitchen when operating appliances like stoves, microwave oven, and blender. Consider having supervision and share cooking responsibilities until no longer able to participate in those. Accidents with firearms and other hazards in the house should be identified and addressed as well.   ABILITY TO BE LEFT ALONE: If patient is unable to contact 911 operator, consider using LifeLine, or when the need is there, arrange for someone to stay with patients. Smoking is a fire hazard, consider supervision or cessation. Risk of wandering should be assessed by caregiver and if detected at any point, supervision and safe proof recommendations should be instituted.  RECOMMENDATIONS FOR ALL PATIENTS WITH MEMORY PROBLEMS: 1. Continue to exercise (Recommend 30 minutes of walking everyday, or 3 hours every week) 2. Increase social interactions - continue going to [place] and enjoy social gatherings with friends and family 3. Eat healthy, avoid fried foods and eat more fruits and vegetables 4. Maintain adequate blood pressure, blood sugar, and blood cholesterol level.  Reducing the risk of stroke and cardiovascular disease also helps promoting better memory. 5. Avoid stressful situations. Live a simple life and avoid aggravations. Organize your time and prepare for the next day in anticipation. 6. Sleep well, avoid any interruptions of sleep and avoid any distractions in the bedroom that may interfere with adequate sleep quality 7. Avoid sugar, avoid sweets as there is a strong link between excessive sugar intake, diabetes, and cognitive impairment We discussed the Mediterranean diet, which has been shown to help patients reduce the risk of progressive memory disorders and reduces cardiovascular risk. This includes eating fish, eat fruits and green leafy vegetables, nuts like almonds and hazelnuts, walnuts, and also use olive oil. Avoid fast foods and fried foods as much as possible. Avoid sweets and sugar as sugar use has been linked to worsening of memory function.  There is always a concern of gradual progression of memory problems. If this is the case, then we may need to adjust level of care according to patient needs. Support, both to the patient and caregiver, should then be put into place.

## 2021-01-11 NOTE — Progress Notes (Signed)
Assessment/Plan:   Dementia, mild 85 year old right-handed woman with a diagnosis of mild dementia.  While initially, her video [place] blind score done over the phone was 17/30, suggestive of mild cognitive impairment, possibly early dementia, today, her MMSE has a score of 27/30, thus stable from the cognitive standpoint.  The patient is currently on rivastigmine 1.5 mg twice daily, initiated on 02/14/2019.  She is tolerating the medication without difficulty.   Follow Up Instructions:  -Discussed the assessment and treatment plan with the patient and her sister Joaquim Lai. The patient and sister were provided an opportunity to ask questions and all were answered. The patient agreed with the plan and demonstrated an understanding of the instructions.  The patient was advised to call back or seek an in-person evaluation if the symptoms worsen or if the condition fails to improve as anticipated.  . Increase rivastigmine to 3 mg twice daily, as she is tolerating it well . Continue to practice safety both in and out of the home. Discussed the importance of regular daily schedule with inclusion of crossword puzzles to maintain brain function.  . Continue to monitor mood  . Increase the level of activity, exercising  at least 30 minutes at least 3 times a week.   . Naps should be scheduled and should be no longer than 60 minutes and should not occur after 2 PM.  . Mediterranean diet is recommended  . Follow up in 6 months   Subjective:   Felicia Kelly was seen today in follow up for mild dementia.  This patient is accompanied in the office by her sister Joaquim Lai who supplements the history.  My previous records as well as any outside records available were reviewed prior to today's visit. Patient is currently on rivastigmine 1.5 mg po bid since 02/2019.  Since her last visit she suffered a fall, hitting her head. CT at the ED showed SAH. NO LOC at the time, and was cleared by Neurosurgery. No  further falls since.  She reports "feeling fine", and that her memory is "good ". Joaquim Lai says that she may be more forgetful, about "10% worse but not much more ".  The patient lives at [facility], and she reports being very happy there, where she finds herself constantly busy with activities. She does admit to not exercising much, although "last night, the [facility] told me to exercise more" so she decided to start tomorrow to increase her walking at least on the treadmill.  She denies any paranoia, or irritability.  In fact, her sister Joaquim Lai reports that her mood has improved since being at [facility]- at the time of diagnosis, her husband and brother had died, which may have affected her mood as well, and she is now accepting this reality-.  She is sleeping well, about 8 or 9 hours a night, without any naps during the day.  She denies any dizziness, headaches, and she ambulates without difficulty, she does not use the walker, but she does use a cane only to go to the bathroom at night.  She denies unilateral weakness, or pain.  Her appetite is good, she admits to crave for sweets.  She denies any dysphagia.She denies any diarrhea or constipation. She denies any bowel or bladder dysfunction.  She denies any tremors.   She no longer cooks or pays bills.  When she walks, she does not get lost or wonders of.  She does not live any objects in unusual places, and reminds herself to  place her keys in a small dish next to her night table, so that she will not forget where she put them.  She is independent in bathing and dressing, but she has to be reminded on when to shower.  No history of alcohol use.    Initial note on 02/14/2019 " History of Present Illness:  This is an 85 year old right-handed woman with a history of hypertension, hyperlipidemia, hypothyroidism, presenting for evaluation of worsening memory. Memory concerns were raised by family, patient has no insight into her condition and was  upset throughout most of the visit (but participated). She states "I forget things, but if you are asking if I have dementia, I don't." She thinks her memory is good. Family started noticing memory changes for several years, worsening recently where she is very forgetful, repeating things constantly. They got 10 Christmas cards from her because she did not remember sending them. Joaquim Lai reports "she has trouble admitting she has issues." She has been living alone for the past 9 years since her husband passed away. She manages finances and medications and denies any difficulties doing these, although Joaquim Lai reports that they had to help her last March because she could not get her taxes together, she seemed to have trouble keeping up with things. She continues to drive and denies getting lost. Joaquim Lai reports she misplaces things frequently (she denies this). Joaquim Lai has noticed more irritability since they approached her about memory concerns last March, no paranoia or hallucinations. She is independent with dressing and bathing, no hygiene concerns, house is well-kept. No family history of dementia, no history of significant head injuries or alcohol use.  She denies any headaches, dizziness, diplopia, dysarthria/dysphagia, neck/back pain, focal numbness/tingling/weakness, bowel/bladder dysfunction, anosmia, or tremors. Sleep is good. She denies any falls."     PREVIOUS MEDICATIONS: Donezepil d/c 05/21/2019  CURRENT MEDICATIONS:  Outpatient Encounter Medications as of 01/11/2021  Medication Sig  . aspirin EC 81 MG tablet Take 81 mg by mouth every evening. Swallow whole.  . Cholecalciferol (VITAMIN D3 PO) Take 1,000 Units by mouth 3 (three) times a week.  . cyanocobalamin 100 MCG tablet Take by mouth.  . ezetimibe (ZETIA) 10 MG tablet Take 1 tablet (10 mg total) by mouth daily.  Marland Kitchen levothyroxine (SYNTHROID) 50 MCG tablet TAKE 1 TABLET ONCE DAILY BEFORE BREAKFAST.  Marland Kitchen losartan-hydrochlorothiazide  (HYZAAR) 100-12.5 MG tablet TAKE 1 TABLET ONCE DAILY.  . Magnesium 250 MG TABS Take by mouth.  . MELATONIN PO Take by mouth every evening.  Marland Kitchen METAMUCIL FIBER PO Take by mouth every evening.  . metFORMIN (GLUCOPHAGE XR) 500 MG 24 hr tablet Take 1 tablet (500 mg total) by mouth daily with breakfast.  . Multiple Vitamin (MULTIVITAMIN) tablet Take 1 tablet by mouth daily.  Marland Kitchen OVER THE COUNTER MEDICATION Occuvite-every morning  . potassium chloride (KLOR-CON) 10 MEQ tablet TAKE 1 TABLET THREE TIMES WEEKLY.  . pravastatin (PRAVACHOL) 40 MG tablet Take 1 tablet (40 mg total) by mouth daily.  . Probiotic Product (PROBIOTIC PO) Take by mouth every evening.  . rivastigmine (EXELON) 1.5 MG capsule TAKE (1) CAPSULE TWICE DAILY.  . valACYclovir (VALTREX) 500 MG tablet Take 1 tablet (500 mg total) by mouth daily.   No facility-administered encounter medications on file as of 01/11/2021.     Objective:     PHYSICAL EXAMINATION:    VITALS:   Vitals:   01/11/21 1344  BP: 115/70  Pulse: 81  SpO2: 97%  Weight: 168 lb 9.6 oz (76.5  kg)  Height: 5\' 2"  (1.575 m)    GEN:  The patient appears stated age and is in NAD. HEENT:  Normocephalic, atraumatic.   Neurological examination: Orientation: The patient is alert. Oriented to person, place and date  Cranial nerves: There is good facial symmetry.The speech is fluent and clear.  Hearing is intact to conversational tone.  No nystagmus is noted.  No incoordination of finger-to-nose testing.  Negative Romberg test.  Reflexes intact. Sensation: Sensation is intact to light touch throughout Motor: Strength is at least antigravity x4. Movement examination: Tone: There is normal tone in the UE/LE Abnormal movements:  no tremor.  No myoclonus.  No asterixis.   Coordination:  There is no decremation with RAM's. Gait and Station: The patient has no difficulty arising out of a deep-seated chair without the use of the hands. The patient's stride length is good.      MMSE - Mini Mental State Exam 01/11/2021 05/14/2018  Orientation to time 5 5  Orientation to Place 5 5  Registration 3 3  Attention/ Calculation 5 4  Recall 1 2  Language- name 2 objects 2 2  Language- repeat 1 1  Language- follow 3 step command 3 3  Language- read & follow direction 1 1  Write a sentence 1 1  Copy design 0 1  Total score 27 28      CBC    Component Value Date/Time   WBC 7.8 09/03/2020 1157   RBC 4.39 09/03/2020 1157   HGB 13.0 09/03/2020 1157   HCT 38.8 09/03/2020 1157   PLT 296 09/03/2020 1157   MCV 88.4 09/03/2020 1157   MCH 29.6 09/03/2020 1157   MCHC 33.5 09/03/2020 1157   RDW 13.2 09/03/2020 1157   LYMPHSABS 2,543 09/03/2020 1157   MONOABS 0.8 02/18/2020 1944   EOSABS 328 09/03/2020 1157   BASOSABS 39 09/03/2020 1157     CMP Latest Ref Rng & Units 09/03/2020 08/03/2020 04/20/2020  Glucose 65 - 99 mg/dL 102(H) - -  BUN 7 - 25 mg/dL 21 - -  Creatinine 0.60 - 0.88 mg/dL 0.80 - -  Sodium 135 - 146 mmol/L 142 - -  Potassium 3.5 - 5.3 mmol/L 3.7 - -  Chloride 98 - 110 mmol/L 104 - -  CO2 20 - 32 mmol/L 23 - -  Calcium 8.6 - 10.4 mg/dL 9.7 - -  Total Protein 6.0 - 8.5 g/dL - 6.8 6.8  Total Bilirubin 0.0 - 1.2 mg/dL - 0.3 0.3  Alkaline Phos 44 - 121 IU/L - 92 92  AST 0 - 40 IU/L - 28 27  ALT 0 - 32 IU/L - 37(H) 29       Total time spent on today's visit was 11minutes, including both face-to-face time and nonface-to-face time.  Time included that spent on review of records (prior notes available to me/labs/imaging if pertinent), discussing treatment and goals, answering patient's questions and coordinating care.  Cc:  Caren Macadam, MD Sharene Butters, PA-C

## 2021-01-17 DIAGNOSIS — Z20828 Contact with and (suspected) exposure to other viral communicable diseases: Secondary | ICD-10-CM | POA: Diagnosis not present

## 2021-01-24 ENCOUNTER — Other Ambulatory Visit: Payer: Self-pay | Admitting: Neurology

## 2021-01-24 DIAGNOSIS — Z20828 Contact with and (suspected) exposure to other viral communicable diseases: Secondary | ICD-10-CM | POA: Diagnosis not present

## 2021-01-31 ENCOUNTER — Ambulatory Visit: Payer: Medicare Other | Admitting: Neurology

## 2021-01-31 DIAGNOSIS — Z20828 Contact with and (suspected) exposure to other viral communicable diseases: Secondary | ICD-10-CM | POA: Diagnosis not present

## 2021-02-07 ENCOUNTER — Other Ambulatory Visit: Payer: Self-pay | Admitting: Family Medicine

## 2021-02-07 DIAGNOSIS — Z20828 Contact with and (suspected) exposure to other viral communicable diseases: Secondary | ICD-10-CM | POA: Diagnosis not present

## 2021-02-07 DIAGNOSIS — Z1231 Encounter for screening mammogram for malignant neoplasm of breast: Secondary | ICD-10-CM

## 2021-02-07 DIAGNOSIS — E876 Hypokalemia: Secondary | ICD-10-CM

## 2021-02-09 ENCOUNTER — Other Ambulatory Visit: Payer: Self-pay | Admitting: Physician Assistant

## 2021-02-09 ENCOUNTER — Other Ambulatory Visit: Payer: Self-pay | Admitting: Family Medicine

## 2021-02-09 DIAGNOSIS — E7800 Pure hypercholesterolemia, unspecified: Secondary | ICD-10-CM

## 2021-02-14 DIAGNOSIS — Z20828 Contact with and (suspected) exposure to other viral communicable diseases: Secondary | ICD-10-CM | POA: Diagnosis not present

## 2021-02-21 DIAGNOSIS — Z20828 Contact with and (suspected) exposure to other viral communicable diseases: Secondary | ICD-10-CM | POA: Diagnosis not present

## 2021-02-22 ENCOUNTER — Encounter (HOSPITAL_COMMUNITY): Payer: Self-pay

## 2021-02-22 ENCOUNTER — Emergency Department (HOSPITAL_COMMUNITY): Payer: Medicare Other

## 2021-02-22 ENCOUNTER — Emergency Department (HOSPITAL_COMMUNITY)
Admission: EM | Admit: 2021-02-22 | Discharge: 2021-02-22 | Disposition: A | Discharge status: Home / Self Care | Payer: Medicare Other | Attending: Emergency Medicine | Admitting: Emergency Medicine

## 2021-02-22 ENCOUNTER — Other Ambulatory Visit: Payer: Self-pay

## 2021-02-22 DIAGNOSIS — Z87891 Personal history of nicotine dependence: Secondary | ICD-10-CM | POA: Diagnosis not present

## 2021-02-22 DIAGNOSIS — Z79899 Other long term (current) drug therapy: Secondary | ICD-10-CM | POA: Insufficient documentation

## 2021-02-22 DIAGNOSIS — I10 Essential (primary) hypertension: Secondary | ICD-10-CM | POA: Insufficient documentation

## 2021-02-22 DIAGNOSIS — E039 Hypothyroidism, unspecified: Secondary | ICD-10-CM | POA: Diagnosis not present

## 2021-02-22 DIAGNOSIS — R531 Weakness: Secondary | ICD-10-CM

## 2021-02-22 DIAGNOSIS — R69 Illness, unspecified: Secondary | ICD-10-CM | POA: Diagnosis not present

## 2021-02-22 DIAGNOSIS — R5381 Other malaise: Secondary | ICD-10-CM | POA: Diagnosis not present

## 2021-02-22 DIAGNOSIS — I2510 Atherosclerotic heart disease of native coronary artery without angina pectoris: Secondary | ICD-10-CM | POA: Diagnosis not present

## 2021-02-22 DIAGNOSIS — Z96641 Presence of right artificial hip joint: Secondary | ICD-10-CM | POA: Diagnosis not present

## 2021-02-22 DIAGNOSIS — Z20822 Contact with and (suspected) exposure to covid-19: Secondary | ICD-10-CM | POA: Diagnosis not present

## 2021-02-22 DIAGNOSIS — E876 Hypokalemia: Secondary | ICD-10-CM | POA: Diagnosis not present

## 2021-02-22 DIAGNOSIS — Z7982 Long term (current) use of aspirin: Secondary | ICD-10-CM | POA: Diagnosis not present

## 2021-02-22 DIAGNOSIS — Z96642 Presence of left artificial hip joint: Secondary | ICD-10-CM | POA: Diagnosis not present

## 2021-02-22 LAB — URINALYSIS, ROUTINE W REFLEX MICROSCOPIC
Bacteria, UA: NONE SEEN
Bilirubin Urine: NEGATIVE
Glucose, UA: NEGATIVE mg/dL
Ketones, ur: NEGATIVE mg/dL
Leukocytes,Ua: NEGATIVE
Nitrite: NEGATIVE
Protein, ur: NEGATIVE mg/dL
Specific Gravity, Urine: 1.016 (ref 1.005–1.030)
pH: 6 (ref 5.0–8.0)

## 2021-02-22 LAB — COMPREHENSIVE METABOLIC PANEL
ALT: 27 U/L (ref 0–44)
AST: 27 U/L (ref 15–41)
Albumin: 3.9 g/dL (ref 3.5–5.0)
Alkaline Phosphatase: 64 U/L (ref 38–126)
Anion gap: 7 (ref 5–15)
BUN: 20 mg/dL (ref 8–23)
CO2: 28 mmol/L (ref 22–32)
Calcium: 9.3 mg/dL (ref 8.9–10.3)
Chloride: 107 mmol/L (ref 98–111)
Creatinine, Ser: 0.9 mg/dL (ref 0.44–1.00)
GFR, Estimated: >60 mL/min (ref >60)
Glucose, Bld: 96 mg/dL (ref 70–99)
Potassium: 3.4 mmol/L — ABNORMAL LOW (ref 3.5–5.1)
Sodium: 142 mmol/L (ref 135–145)
Total Bilirubin: 0.4 mg/dL (ref 0.3–1.2)
Total Protein: 7.1 g/dL (ref 6.5–8.1)

## 2021-02-22 LAB — CBC
HCT: 38.2 % (ref 36.0–46.0)
Hemoglobin: 12.2 g/dL (ref 12.0–15.0)
MCH: 29.2 pg (ref 26.0–34.0)
MCHC: 31.9 g/dL (ref 30.0–36.0)
MCV: 91.4 fL (ref 80.0–100.0)
Platelets: 279 10*3/uL (ref 150–400)
RBC: 4.18 MIL/uL (ref 3.87–5.11)
RDW: 14.2 % (ref 11.5–15.5)
WBC: 8 10*3/uL (ref 4.0–10.5)
nRBC: 0 % (ref 0.0–0.2)

## 2021-02-22 LAB — RESP PANEL BY RT-PCR (FLU A&B, COVID) ARPGX2
Influenza A by PCR: NEGATIVE
Influenza B by PCR: NEGATIVE
SARS Coronavirus 2 by RT PCR: NEGATIVE

## 2021-02-22 MED ORDER — POTASSIUM CHLORIDE CRYS ER 20 MEQ PO TBCR
20.0000 meq | EXTENDED_RELEASE_TABLET | Freq: Once | ORAL | Status: AC
  Administered 2021-02-22: 20 meq via ORAL
  Filled 2021-02-22: qty 1

## 2021-02-22 NOTE — ED Notes (Signed)
Ambulatory to restroom with supervision

## 2021-02-22 NOTE — ED Triage Notes (Signed)
BIB ems from home with complaints of generalized weakness/fatigue. cbg-150 with ems. Hx of dementia

## 2021-02-22 NOTE — ED Provider Notes (Signed)
[facility] DEPT Provider Note   CSN: [id number] Arrival date & time: 02/22/21  1507     History Chief Complaint  Patient presents with  . Fatigue    Felicia Kelly is a 85 y.o. female.  HPI Level 5 caveat secondary to dementia 85 year old female who is in assisted living care facility with memory care presents today with her sister.  Her sister is the historian.  Her sister states that she came to visit her today.  Today she complained of feeling generally weak and just not quite like herself.  Her sister states that this is very atypical.  Secondary to this she had her blood pressure checked.  She reported that she was normotensive.  She took her down to her physical therapy.  There they checked her pulse ox and said it was 78%.  Her sister states that they checked twice no 70%.  Her sister states, however EMS arrived and it was normal.  She states that after they saw her for a while they decided to transport her to the hospital for evaluation.  She denies any headache, head injury, fall, chest pain, cough, dyspnea, nasal congestion, sore throat, nausea, vomiting, or diarrhea.  Her sister states she has had COVID-vaccine and booster x2 with the most recent 1 within the past month.  There is no known COVID exposure.  She has not noted any increased frequency nation or history of UTIs.  No known recent medication changes.    Past Medical History:  Diagnosis Date  . Aortic atherosclerosis ([employer])    a. noted on CT 01/2018.  . Arthritis   . Colonic polyp   . Coronary atherosclerosis    a. noted on CT 01/2018.  Marland Kitchen Genital herpes   . Hyperlipidemia   . Hypertension   . Mild carotid artery disease ([place])   . Osteoporosis   . Overdose of cardiac medication   . Pulmonary nodules    a. followed by PCP by serial Ct.  . Thyroid disease    seeing Dr. Buddy Duty  . Tremor     Patient Active Problem List   Diagnosis Date Noted  . SAH (subarachnoid hemorrhage)  ([place]) 02/24/2020  . Acute encephalopathy 04/02/2019  . Bradycardia 04/01/2019  . Cystocele 02/13/2019  . Hip pain 02/13/2019  . Osteoarthritis of hip 02/13/2019  . Subchondral cysts 02/13/2019  . Vitamin D deficiency 02/13/2019  . Aortic atherosclerosis ([place]) 10/14/2017  . Solitary pulmonary nodule 10/23/2016  . Hyperglycemia 07/11/2016  . Hypercholesterolemia 06/04/2013  . Hypothyroidism 06/04/2013  . Osteopenia 06/27/2012  . Malignant basal cell neoplasm of skin 02/14/2012  . Benign essential hypertension 11/22/2011  . Internal carotid artery stenosis 09/18/2011  . Thumb pain 09/13/2011  . Multinodular goiter 10/16/2009  . Herpes simplex type 2 infection 10/05/2008  . Single renal cyst 02/14/2008    Past Surgical History:  Procedure Laterality Date  . ABDOMINAL HYSTERECTOMY  1988   no cancer - reports total with bilat oophorectomy  . APPENDECTOMY  1988  . BREAST CYST EXCISION Bilateral   . BREAST SURGERY  1985   biopsy  . FOOT SURGERY     multiple sugeries, remote  . TOTAL HIP ARTHROPLASTY Bilateral      OB History   No obstetric history on file.     Family History  Problem Relation Age of Onset  . Pancreatic cancer Father   . Heart disease Father   . Heart attack Sister 49  . Arthritis Mother   .  Hyperlipidemia Mother   . Hypertension Mother   . Heart disease Mother   . Diabetes Mother   . Liver disease Brother        cancer of bile duct  . Heart disease Maternal Grandfather   . Heart attack Brother   . Breast cancer Neg Hx     Social History   Tobacco Use  . Smoking status: Former Smoker    Quit date: 10/16/1978    Years since quitting: 42.3  . Smokeless tobacco: Never Used  . Tobacco comment: 1963 when her father died;  smoked for a brief period of time  Vaping Use  . Vaping Use: Never used  Substance Use Topics  . Alcohol use: No    Alcohol/week: 0.0 standard drinks  . Drug use: No    Home Medications Prior to Admission medications    Medication Sig Start Date End Date Taking? Authorizing Provider  aspirin EC 81 MG tablet Take 81 mg by mouth every evening. Swallow whole.    [provider]  Cholecalciferol (VITAMIN D3 PO) Take 1,000 Units by mouth 3 (three) times a week.    [provider]  cyanocobalamin 100 MCG tablet Take by mouth.    [provider]  ezetimibe (ZETIA) 10 MG tablet Take 1 tablet (10 mg total) by mouth daily. 04/26/20 07/25/20  Imogene Burn, PA-C  levothyroxine (SYNTHROID) 50 MCG tablet TAKE 1 TABLET ONCE DAILY BEFORE BREAKFAST. 12/22/20   Koberlein, Steele Berg, MD  losartan-hydrochlorothiazide (HYZAAR) 100-12.5 MG tablet TAKE 1 TABLET ONCE DAILY. 02/09/21   Caren Macadam, MD  Magnesium 250 MG TABS Take by mouth.    [provider]  MELATONIN PO Take by mouth every evening.    [provider]  METAMUCIL FIBER PO Take by mouth every evening.    [provider]  metFORMIN (GLUCOPHAGE-XR) 500 MG 24 hr tablet TAKE 1 TABLET DAILY WITH BREAKFAST. 02/09/21   Caren Macadam, MD  Multiple Vitamin (MULTIVITAMIN) tablet Take 1 tablet by mouth daily.    [provider]  OVER THE COUNTER MEDICATION Occuvite-every morning    [provider]  potassium chloride (KLOR-CON) 10 MEQ tablet TAKE 1 TABLET THREE TIMES WEEKLY. 02/07/21   Koberlein, Steele Berg, MD  pravastatin (PRAVACHOL) 40 MG tablet TAKE 1 TABLET ONCE DAILY. 02/09/21   Imogene Burn, PA-C  Probiotic Product (PROBIOTIC PO) Take by mouth every evening.    [provider]  rivastigmine (EXELON) 3 MG capsule Take 1 capsule (3 mg total) by mouth 2 (two) times daily. 01/11/21   Cameron Sprang, MD  valACYclovir (VALTREX) 500 MG tablet Take 1 tablet (500 mg total) by mouth daily. 09/03/20   Caren Macadam, MD    Allergies    Penicillins, Tetracycline, Atorvastatin, Bactrim [sulfamethoxazole-trimethoprim], Celecoxib, Niacin, Niaspan [niacin er], Rosuvastatin, Simvastatin, and  Tetracyclines & related  Review of Systems   Review of Systems  All other systems reviewed and are negative.   Physical Exam Updated Vital Signs BP 138/69   Pulse 60   Temp 98.6 F (37 C) (Rectal)   Resp 20   Ht 1.575 m (5\' 2" )   Wt 76 kg   SpO2 99%   BMI 30.65 kg/m   Physical Exam Vitals and nursing note reviewed.  Constitutional:      General: She is not in acute distress.    Appearance: Normal appearance. She is not ill-appearing.  HENT:     Head: Normocephalic.  Right Ear: External ear normal.     Left Ear: External ear normal.     Nose: Nose normal.     Mouth/Throat:     Mouth: Mucous membranes are moist.     Pharynx: Oropharynx is clear.  Eyes:     Extraocular Movements: Extraocular movements intact.     Pupils: Pupils are equal, round, and reactive to light.  Cardiovascular:     Rate and Rhythm: Normal rate and regular rhythm.     Pulses: Normal pulses.     Heart sounds: Normal heart sounds.  Pulmonary:     Effort: Pulmonary effort is normal.     Breath sounds: Normal breath sounds.  Abdominal:     General: Abdomen is flat. Bowel sounds are normal.     Palpations: Abdomen is soft.  Musculoskeletal:        General: No swelling or tenderness. Normal range of motion.     Cervical back: Normal range of motion.  Skin:    General: Skin is warm and dry.     Capillary Refill: Capillary refill takes less than 2 seconds.  Neurological:     General: No focal deficit present.     Mental Status: She is alert. Mental status is at baseline.     Cranial Nerves: No cranial nerve deficit.     Motor: No weakness.     Coordination: Coordination normal.     Gait: Gait normal.     Deep Tendon Reflexes: Reflexes normal.     Comments: Oriented to person place but not to date  Psychiatric:        Mood and Affect: Mood normal.     ED Results / Procedures / Treatments   Labs (all labs ordered are listed, but only abnormal results are displayed) Labs Reviewed   URINALYSIS, ROUTINE W REFLEX MICROSCOPIC - Abnormal; Notable for the following components:      Result Value   Hgb urine dipstick SMALL (*)    All other components within normal limits  RESP PANEL BY RT-PCR (FLU A&B, COVID) [id number]  CBC  COMPREHENSIVE METABOLIC PANEL    EKG EKG Interpretation  Date/Time:  Tuesday Feb 22 2021 16:34:42 EDT Ventricular Rate:  64 PR Interval:  158 QRS Duration: 95 QT Interval:  437 QTC Calculation: 451 R Axis:   63 Text Interpretation: Sinus arrhythmia Anteroseptal infarct, age indeterminate No significant change since last tracing Confirmed by Pattricia Boss [phone]) on 02/22/2021 5:05:23 PM   Radiology DG Chest Port 1 View  Result Date: 02/22/2021 CLINICAL DATA:  Generalized weakness EXAM: PORTABLE CHEST 1 VIEW COMPARISON:  04/03/2020 FINDINGS: Cardiac shadow is within normal limits. The lungs are clear bilaterally. No bony abnormality is noted. IMPRESSION: No active disease. Electronically Signed   By: Inez Catalina M.D.   On: 02/22/2021 17:36    Procedures Procedures   Medications Ordered in ED Medications - No data to display  ED Course  I have reviewed the triage vital signs and the nursing notes.  Pertinent labs & imaging results that were available during my care of the patient were reviewed by me and considered in my medical decision making (see chart for details).    MDM Rules/Calculators/A&P                          85 year old female with some mild dementia presents today with generally not feeling well.  Her sister was concerned because at the facility it was noted that  her oxygen saturations were low.  Here her oxygen saturations have been 96 to 100%.  I suspect that this was a fallacious result.  There is no significant intervention and patient's oxygen saturations have been normal here.  Her CBC is normal.  She had mild hypokalemia and had some replenishment given.  She had a COVID test done that is negative.  EKG and chest x-Kenai Fluegel  are without acute abnormalities.  Discussed return precautions and need for follow-up with the patient and her sister and they voiced understanding. Final Clinical Impression(s) / ED Diagnoses Final diagnoses:  Weakness  Hypokalemia    Rx / DC Orders ED Discharge Orders    None       Pattricia Boss, MD 02/22/21 2027

## 2021-02-22 NOTE — ED Notes (Signed)
Introduced myself to patient and sister.

## 2021-02-22 NOTE — Discharge Instructions (Addendum)
Your potassium was mildly low here.  You received a dose of oral potassium.  Please follow-up with your doctor for a recheck this week.  Return to the emergency department if you are worse at any time. Your blood count, remainder of your chemistry work, and COVID were all in normal range.

## 2021-02-22 NOTE — ED Notes (Signed)
Urine in lab if needed

## 2021-02-24 LAB — URINE CULTURE

## 2021-02-28 DIAGNOSIS — Z20828 Contact with and (suspected) exposure to other viral communicable diseases: Secondary | ICD-10-CM | POA: Diagnosis not present

## 2021-03-03 ENCOUNTER — Other Ambulatory Visit: Payer: Self-pay

## 2021-03-04 ENCOUNTER — Ambulatory Visit: Payer: Medicare Other | Admitting: Family Medicine

## 2021-03-04 ENCOUNTER — Encounter: Payer: Self-pay | Admitting: Family Medicine

## 2021-03-04 ENCOUNTER — Ambulatory Visit (INDEPENDENT_AMBULATORY_CARE_PROVIDER_SITE_OTHER): Payer: Medicare Other | Admitting: Family Medicine

## 2021-03-04 VITALS — BP 108/62 | HR 74 | Temp 98.0°F | Ht 62.0 in | Wt 167.5 lb

## 2021-03-04 DIAGNOSIS — M8580 Other specified disorders of bone density and structure, unspecified site: Secondary | ICD-10-CM | POA: Diagnosis not present

## 2021-03-04 DIAGNOSIS — R413 Other amnesia: Secondary | ICD-10-CM | POA: Diagnosis not present

## 2021-03-04 DIAGNOSIS — I10 Essential (primary) hypertension: Secondary | ICD-10-CM | POA: Diagnosis not present

## 2021-03-04 DIAGNOSIS — E876 Hypokalemia: Secondary | ICD-10-CM

## 2021-03-04 DIAGNOSIS — E038 Other specified hypothyroidism: Secondary | ICD-10-CM | POA: Diagnosis not present

## 2021-03-04 DIAGNOSIS — E7800 Pure hypercholesterolemia, unspecified: Secondary | ICD-10-CM

## 2021-03-04 DIAGNOSIS — R739 Hyperglycemia, unspecified: Secondary | ICD-10-CM | POA: Diagnosis not present

## 2021-03-04 LAB — BASIC METABOLIC PANEL
BUN: 16 mg/dL (ref 6–23)
CO2: 28 mEq/L (ref 19–32)
Calcium: 9.7 mg/dL (ref 8.4–10.5)
Chloride: 104 mEq/L (ref 96–112)
Creatinine, Ser: 0.82 mg/dL (ref 0.40–1.20)
GFR: 66.3 mL/min (ref >60.00)
Glucose, Bld: 127 mg/dL — ABNORMAL HIGH (ref 70–99)
Potassium: 3.8 mEq/L (ref 3.5–5.1)
Sodium: 141 mEq/L (ref 135–145)

## 2021-03-04 LAB — LIPID PANEL
Cholesterol: 133 mg/dL (ref 0–200)
HDL: 44.7 mg/dL (ref >39.00)
LDL Cholesterol: 55 mg/dL (ref 0–99)
NonHDL: 88.13
Total CHOL/HDL Ratio: 3
Triglycerides: 166 mg/dL — ABNORMAL HIGH (ref 0.0–149.0)
VLDL: 33.2 mg/dL (ref 0.0–40.0)

## 2021-03-04 LAB — TSH: TSH: 2.41 u[IU]/mL (ref 0.35–4.50)

## 2021-03-04 LAB — HEMOGLOBIN A1C: Hgb A1c MFr Bld: 6.9 % — ABNORMAL HIGH (ref 4.6–6.5)

## 2021-03-04 NOTE — Patient Instructions (Signed)
Keep a check on blood pressure - if regularly running below 295 systolic or less than 65 diastolic.

## 2021-03-04 NOTE — Progress Notes (Signed)
Felicia Kelly DOB: Jul 28, 1936 Encounter date: 03/04/2021  This is a 85 y.o. female who presents with Chief Complaint  Patient presents with  . Follow-up    History of present illness: Was in ER on 5/10 - sister had gone to visit and she just wasn't feeling well. O2 sat was 78-80 down at therapy. Ended up calling EMS for her. By the time she was in ambulance it jumped up to 90's; then 100. She had been doing some therapy for balance, but has completed this. Uses cane at night when up; just for safety.   Was very tired, just not feeling well. Really not sure what caused this. Just unusual for her to not feel well.   DMII: tolerating the metformin well.   HTN: doesn't check regularly.   Hypothyroid: synthroid 43mcg daily.   HL: pravastatin 40mg ; zetia 10mg  daily. Tolerating these well.   Mood doing ok; sleeping ok.   Has appointment with dermatology next month  Allergies  Allergen Reactions  . Penicillins Swelling and Rash    ALL CILLINS  . Tetracycline Itching  . Atorvastatin Other (See Comments)    Leg cramps Leg cramps  . Bactrim [Sulfamethoxazole-Trimethoprim] Itching  . Celecoxib Other (See Comments)    Memory disturbance, slowed thinking Memory disturbance, slowed thinking  . Niacin Other (See Comments)    Caused elevated BP  . Niaspan [Niacin Er]     Elevated BP  . Rosuvastatin Other (See Comments)    Elevated CPK  . Simvastatin Other (See Comments)    Leg cramps Leg cramps  . Tetracyclines & Related Itching   Current Meds  Medication Sig  . aspirin EC 81 MG tablet Take 81 mg by mouth every evening. Swallow whole.  . Cholecalciferol (VITAMIN D3 PO) Take 1,000 Units by mouth 3 (three) times a week.  . cyanocobalamin 100 MCG tablet Take by mouth.  . levothyroxine (SYNTHROID) 50 MCG tablet TAKE 1 TABLET ONCE DAILY BEFORE BREAKFAST.  Marland Kitchen losartan-hydrochlorothiazide (HYZAAR) 100-12.5 MG tablet TAKE 1 TABLET ONCE DAILY.  . Magnesium 250 MG TABS Take by  mouth.  . MELATONIN PO Take by mouth every evening.  Marland Kitchen METAMUCIL FIBER PO Take by mouth every evening.  . metFORMIN (GLUCOPHAGE-XR) 500 MG 24 hr tablet TAKE 1 TABLET DAILY WITH BREAKFAST.  . Multiple Vitamin (MULTIVITAMIN) tablet Take 1 tablet by mouth daily.  Marland Kitchen OVER THE COUNTER MEDICATION Occuvite-every morning  . potassium chloride (KLOR-CON) 10 MEQ tablet TAKE 1 TABLET THREE TIMES WEEKLY.  . pravastatin (PRAVACHOL) 40 MG tablet TAKE 1 TABLET ONCE DAILY.  . Probiotic Product (PROBIOTIC PO) Take by mouth every evening.  . rivastigmine (EXELON) 3 MG capsule Take 1 capsule (3 mg total) by mouth 2 (two) times daily.  . valACYclovir (VALTREX) 500 MG tablet Take 1 tablet (500 mg total) by mouth daily.    Review of Systems  Constitutional: Negative for chills, fatigue and fever.  Respiratory: Negative for cough, chest tightness, shortness of breath and wheezing.   Cardiovascular: Negative for chest pain, palpitations and leg swelling.  Gastrointestinal: Negative for abdominal pain.  Musculoskeletal: Negative for arthralgias.  Neurological: Negative for dizziness, light-headedness and headaches.  Psychiatric/Behavioral: Negative for agitation and sleep disturbance.    Objective:  BP 108/62 (BP Location: Left Arm, Patient Position: Sitting, Cuff Size: Large)   Pulse 74   Temp 98 F (36.7 C) (Oral)   Ht 5\' 2"  (1.575 m)   Wt 167 lb 8 oz (76 kg)   SpO2 96%  BMI 30.64 kg/m   Weight: 167 lb 8 oz (76 kg)   BP Readings from Last 3 Encounters:  03/04/21 108/62  02/22/21 126/62  01/11/21 115/70   Wt Readings from Last 3 Encounters:  03/04/21 167 lb 8 oz (76 kg)  02/22/21 167 lb 8.8 oz (76 kg)  01/11/21 168 lb 9.6 oz (76.5 kg)    Physical Exam Constitutional:      General: She is not in acute distress.    Appearance: She is well-developed.  Cardiovascular:     Rate and Rhythm: Normal rate and regular rhythm.     Heart sounds: Normal heart sounds. No murmur heard. No friction rub.   Pulmonary:     Effort: Pulmonary effort is normal. No respiratory distress.     Breath sounds: Normal breath sounds. No wheezing or rales.  Musculoskeletal:     Right lower leg: No edema.     Left lower leg: No edema.       Feet:  Feet:     Comments: Callus bilateral balls of feet, medial and lateral aspects.  Patient has done well before with shaving of these and requests to have this completed.  She does have small bunions and bunionettes bilaterally. Neurological:     Mental Status: She is alert and oriented to person, place, and time.     Cranial Nerves: Cranial nerves are intact.     Motor: Weakness present.     Comments: 4+/5 abduction shoulders, 4/5 tricep/bicep bilat; 4+/5 hip flexors, quad, hamstrings bilat.     Psychiatric:        Behavior: Behavior normal.   Callous treatment After verbal consent was obtained, bilateral feet were cleansed with alcohol and using a dermal curette callus was shaved from lateral and medial aspect of the ball of her feet.  Patient tolerated well.  1 area of callus did go slightly deeper into the tissue on the lateral aspect of her left foot, and small amount of bleeding started.  Patient did not have any discomfort at this and bandage was applied.  Assessment/Plan  1. Benign essential hypertension Well controlled; on low end of normal. Asked them to check on occasion and if running low we can always drop the hctz component of her medication.  - Basic metabolic panel; Future - Basic metabolic panel  2. Other specified hypothyroidism Continue with synthroid 50mcg daily. TSH has been stable.  - TSH; Future - TSH  3. Osteopenia, unspecified location Continue to encourage weight bearing exercise. Due to some general weakness; I am going to suggest regular PT. She will not exercise unless it is directed by someone/working one on one. Continue with vitamin D 1000 units three times weekly.   4. Hypercholesterolemia Continue with pravastatin  40 mg daily. - Lipid panel; Future - Lipid panel  5. Hyperglycemia She does work on not over indulging, but she has healthy appetite.  A1c has been stable.  Continue with metformin 500 mg once daily. - Hemoglobin A1c; Future - Hemoglobin A1c  6. Hypokalemia Recheck to make sure her levels are back in normal range.  7. Memory loss Patient is in assisted living facility.  She enjoys living there and is well cared for.  Her sister checks in with her very regularly. She follows with Dr. Delice Lesch, neurology. On rivastigmine 3mg  BID. Mood is good.   Return in about 6 months (around 09/04/2021) for Chronic condition visit.  40 minutes spent in chart review; discussion of preventative care measures including exercise, follow  up for chronic conditions including home bp monitoring, exam, charting, and foot care.    Micheline Rough, MD

## 2021-03-07 DIAGNOSIS — R278 Other lack of coordination: Secondary | ICD-10-CM | POA: Diagnosis not present

## 2021-03-07 DIAGNOSIS — Z20828 Contact with and (suspected) exposure to other viral communicable diseases: Secondary | ICD-10-CM | POA: Diagnosis not present

## 2021-03-07 DIAGNOSIS — R2681 Unsteadiness on feet: Secondary | ICD-10-CM | POA: Diagnosis not present

## 2021-03-07 DIAGNOSIS — R41841 Cognitive communication deficit: Secondary | ICD-10-CM | POA: Diagnosis not present

## 2021-03-07 DIAGNOSIS — M6281 Muscle weakness (generalized): Secondary | ICD-10-CM | POA: Diagnosis not present

## 2021-03-07 DIAGNOSIS — R488 Other symbolic dysfunctions: Secondary | ICD-10-CM | POA: Diagnosis not present

## 2021-03-09 DIAGNOSIS — R2681 Unsteadiness on feet: Secondary | ICD-10-CM | POA: Diagnosis not present

## 2021-03-09 DIAGNOSIS — M6281 Muscle weakness (generalized): Secondary | ICD-10-CM | POA: Diagnosis not present

## 2021-03-09 DIAGNOSIS — R41841 Cognitive communication deficit: Secondary | ICD-10-CM | POA: Diagnosis not present

## 2021-03-09 DIAGNOSIS — R278 Other lack of coordination: Secondary | ICD-10-CM | POA: Diagnosis not present

## 2021-03-09 DIAGNOSIS — R488 Other symbolic dysfunctions: Secondary | ICD-10-CM | POA: Diagnosis not present

## 2021-03-10 DIAGNOSIS — M6281 Muscle weakness (generalized): Secondary | ICD-10-CM | POA: Diagnosis not present

## 2021-03-10 DIAGNOSIS — R488 Other symbolic dysfunctions: Secondary | ICD-10-CM | POA: Diagnosis not present

## 2021-03-10 DIAGNOSIS — R278 Other lack of coordination: Secondary | ICD-10-CM | POA: Diagnosis not present

## 2021-03-10 DIAGNOSIS — R41841 Cognitive communication deficit: Secondary | ICD-10-CM | POA: Diagnosis not present

## 2021-03-10 DIAGNOSIS — R2681 Unsteadiness on feet: Secondary | ICD-10-CM | POA: Diagnosis not present

## 2021-03-11 DIAGNOSIS — M6281 Muscle weakness (generalized): Secondary | ICD-10-CM | POA: Diagnosis not present

## 2021-03-11 DIAGNOSIS — R41841 Cognitive communication deficit: Secondary | ICD-10-CM | POA: Diagnosis not present

## 2021-03-11 DIAGNOSIS — R2681 Unsteadiness on feet: Secondary | ICD-10-CM | POA: Diagnosis not present

## 2021-03-11 DIAGNOSIS — R278 Other lack of coordination: Secondary | ICD-10-CM | POA: Diagnosis not present

## 2021-03-11 DIAGNOSIS — R488 Other symbolic dysfunctions: Secondary | ICD-10-CM | POA: Diagnosis not present

## 2021-03-15 DIAGNOSIS — R2681 Unsteadiness on feet: Secondary | ICD-10-CM | POA: Diagnosis not present

## 2021-03-15 DIAGNOSIS — R278 Other lack of coordination: Secondary | ICD-10-CM | POA: Diagnosis not present

## 2021-03-15 DIAGNOSIS — R488 Other symbolic dysfunctions: Secondary | ICD-10-CM | POA: Diagnosis not present

## 2021-03-15 DIAGNOSIS — M6281 Muscle weakness (generalized): Secondary | ICD-10-CM | POA: Diagnosis not present

## 2021-03-15 DIAGNOSIS — R41841 Cognitive communication deficit: Secondary | ICD-10-CM | POA: Diagnosis not present

## 2021-03-17 DIAGNOSIS — Z1283 Encounter for screening for malignant neoplasm of skin: Secondary | ICD-10-CM | POA: Diagnosis not present

## 2021-03-17 DIAGNOSIS — L821 Other seborrheic keratosis: Secondary | ICD-10-CM | POA: Diagnosis not present

## 2021-03-18 DIAGNOSIS — M6281 Muscle weakness (generalized): Secondary | ICD-10-CM | POA: Diagnosis not present

## 2021-03-18 DIAGNOSIS — R488 Other symbolic dysfunctions: Secondary | ICD-10-CM | POA: Diagnosis not present

## 2021-03-18 DIAGNOSIS — R41841 Cognitive communication deficit: Secondary | ICD-10-CM | POA: Diagnosis not present

## 2021-03-18 DIAGNOSIS — R278 Other lack of coordination: Secondary | ICD-10-CM | POA: Diagnosis not present

## 2021-03-18 DIAGNOSIS — R2681 Unsteadiness on feet: Secondary | ICD-10-CM | POA: Diagnosis not present

## 2021-03-21 DIAGNOSIS — R488 Other symbolic dysfunctions: Secondary | ICD-10-CM | POA: Diagnosis not present

## 2021-03-21 DIAGNOSIS — Z20828 Contact with and (suspected) exposure to other viral communicable diseases: Secondary | ICD-10-CM | POA: Diagnosis not present

## 2021-03-21 DIAGNOSIS — R278 Other lack of coordination: Secondary | ICD-10-CM | POA: Diagnosis not present

## 2021-03-21 DIAGNOSIS — R41841 Cognitive communication deficit: Secondary | ICD-10-CM | POA: Diagnosis not present

## 2021-03-21 DIAGNOSIS — R2681 Unsteadiness on feet: Secondary | ICD-10-CM | POA: Diagnosis not present

## 2021-03-21 DIAGNOSIS — M6281 Muscle weakness (generalized): Secondary | ICD-10-CM | POA: Diagnosis not present

## 2021-03-22 DIAGNOSIS — M6281 Muscle weakness (generalized): Secondary | ICD-10-CM | POA: Diagnosis not present

## 2021-03-22 DIAGNOSIS — R2681 Unsteadiness on feet: Secondary | ICD-10-CM | POA: Diagnosis not present

## 2021-03-22 DIAGNOSIS — R278 Other lack of coordination: Secondary | ICD-10-CM | POA: Diagnosis not present

## 2021-03-22 DIAGNOSIS — R488 Other symbolic dysfunctions: Secondary | ICD-10-CM | POA: Diagnosis not present

## 2021-03-22 DIAGNOSIS — R41841 Cognitive communication deficit: Secondary | ICD-10-CM | POA: Diagnosis not present

## 2021-03-23 DIAGNOSIS — R41841 Cognitive communication deficit: Secondary | ICD-10-CM | POA: Diagnosis not present

## 2021-03-23 DIAGNOSIS — R278 Other lack of coordination: Secondary | ICD-10-CM | POA: Diagnosis not present

## 2021-03-23 DIAGNOSIS — M6281 Muscle weakness (generalized): Secondary | ICD-10-CM | POA: Diagnosis not present

## 2021-03-23 DIAGNOSIS — R488 Other symbolic dysfunctions: Secondary | ICD-10-CM | POA: Diagnosis not present

## 2021-03-23 DIAGNOSIS — R2681 Unsteadiness on feet: Secondary | ICD-10-CM | POA: Diagnosis not present

## 2021-03-24 DIAGNOSIS — R488 Other symbolic dysfunctions: Secondary | ICD-10-CM | POA: Diagnosis not present

## 2021-03-24 DIAGNOSIS — R278 Other lack of coordination: Secondary | ICD-10-CM | POA: Diagnosis not present

## 2021-03-24 DIAGNOSIS — R2681 Unsteadiness on feet: Secondary | ICD-10-CM | POA: Diagnosis not present

## 2021-03-24 DIAGNOSIS — M6281 Muscle weakness (generalized): Secondary | ICD-10-CM | POA: Diagnosis not present

## 2021-03-24 DIAGNOSIS — Z20828 Contact with and (suspected) exposure to other viral communicable diseases: Secondary | ICD-10-CM | POA: Diagnosis not present

## 2021-03-24 DIAGNOSIS — R41841 Cognitive communication deficit: Secondary | ICD-10-CM | POA: Diagnosis not present

## 2021-03-25 DIAGNOSIS — R41841 Cognitive communication deficit: Secondary | ICD-10-CM | POA: Diagnosis not present

## 2021-03-25 DIAGNOSIS — R2681 Unsteadiness on feet: Secondary | ICD-10-CM | POA: Diagnosis not present

## 2021-03-25 DIAGNOSIS — R488 Other symbolic dysfunctions: Secondary | ICD-10-CM | POA: Diagnosis not present

## 2021-03-25 DIAGNOSIS — R278 Other lack of coordination: Secondary | ICD-10-CM | POA: Diagnosis not present

## 2021-03-25 DIAGNOSIS — M6281 Muscle weakness (generalized): Secondary | ICD-10-CM | POA: Diagnosis not present

## 2021-03-28 DIAGNOSIS — Z20828 Contact with and (suspected) exposure to other viral communicable diseases: Secondary | ICD-10-CM | POA: Diagnosis not present

## 2021-03-28 DIAGNOSIS — R2681 Unsteadiness on feet: Secondary | ICD-10-CM | POA: Diagnosis not present

## 2021-03-28 DIAGNOSIS — R278 Other lack of coordination: Secondary | ICD-10-CM | POA: Diagnosis not present

## 2021-03-28 DIAGNOSIS — M6281 Muscle weakness (generalized): Secondary | ICD-10-CM | POA: Diagnosis not present

## 2021-03-28 DIAGNOSIS — R41841 Cognitive communication deficit: Secondary | ICD-10-CM | POA: Diagnosis not present

## 2021-03-28 DIAGNOSIS — R488 Other symbolic dysfunctions: Secondary | ICD-10-CM | POA: Diagnosis not present

## 2021-03-29 DIAGNOSIS — R488 Other symbolic dysfunctions: Secondary | ICD-10-CM | POA: Diagnosis not present

## 2021-03-29 DIAGNOSIS — M6281 Muscle weakness (generalized): Secondary | ICD-10-CM | POA: Diagnosis not present

## 2021-03-29 DIAGNOSIS — R2681 Unsteadiness on feet: Secondary | ICD-10-CM | POA: Diagnosis not present

## 2021-03-29 DIAGNOSIS — R278 Other lack of coordination: Secondary | ICD-10-CM | POA: Diagnosis not present

## 2021-03-29 DIAGNOSIS — R41841 Cognitive communication deficit: Secondary | ICD-10-CM | POA: Diagnosis not present

## 2021-03-30 ENCOUNTER — Telehealth: Payer: Self-pay | Admitting: Family Medicine

## 2021-03-30 DIAGNOSIS — R41841 Cognitive communication deficit: Secondary | ICD-10-CM | POA: Diagnosis not present

## 2021-03-30 DIAGNOSIS — R488 Other symbolic dysfunctions: Secondary | ICD-10-CM | POA: Diagnosis not present

## 2021-03-30 DIAGNOSIS — R278 Other lack of coordination: Secondary | ICD-10-CM | POA: Diagnosis not present

## 2021-03-30 DIAGNOSIS — M6281 Muscle weakness (generalized): Secondary | ICD-10-CM | POA: Diagnosis not present

## 2021-03-30 DIAGNOSIS — R2681 Unsteadiness on feet: Secondary | ICD-10-CM | POA: Diagnosis not present

## 2021-03-30 NOTE — Telephone Encounter (Signed)
Patient is calling and stated that pt is scheduled for a bone density for 12/2 and wanted to know if she should be seen somewhere else to get her in sooner, please advice. CB is [phone]

## 2021-03-31 DIAGNOSIS — R2681 Unsteadiness on feet: Secondary | ICD-10-CM | POA: Diagnosis not present

## 2021-03-31 DIAGNOSIS — R278 Other lack of coordination: Secondary | ICD-10-CM | POA: Diagnosis not present

## 2021-03-31 DIAGNOSIS — Z20828 Contact with and (suspected) exposure to other viral communicable diseases: Secondary | ICD-10-CM | POA: Diagnosis not present

## 2021-03-31 DIAGNOSIS — M6281 Muscle weakness (generalized): Secondary | ICD-10-CM | POA: Diagnosis not present

## 2021-03-31 DIAGNOSIS — R488 Other symbolic dysfunctions: Secondary | ICD-10-CM | POA: Diagnosis not present

## 2021-03-31 DIAGNOSIS — R41841 Cognitive communication deficit: Secondary | ICD-10-CM | POA: Diagnosis not present

## 2021-03-31 NOTE — Telephone Encounter (Signed)
Left a detailed message on Mrs Reece's voicemail stating the pt should be fine to await the bone density in December as this is not an urgent test and [facility] check for cancellations.

## 2021-04-01 ENCOUNTER — Other Ambulatory Visit: Payer: Medicare Other

## 2021-04-01 DIAGNOSIS — R41841 Cognitive communication deficit: Secondary | ICD-10-CM | POA: Diagnosis not present

## 2021-04-01 DIAGNOSIS — R2681 Unsteadiness on feet: Secondary | ICD-10-CM | POA: Diagnosis not present

## 2021-04-01 DIAGNOSIS — R278 Other lack of coordination: Secondary | ICD-10-CM | POA: Diagnosis not present

## 2021-04-01 DIAGNOSIS — M6281 Muscle weakness (generalized): Secondary | ICD-10-CM | POA: Diagnosis not present

## 2021-04-01 DIAGNOSIS — R488 Other symbolic dysfunctions: Secondary | ICD-10-CM | POA: Diagnosis not present

## 2021-04-02 ENCOUNTER — Other Ambulatory Visit: Payer: Self-pay | Admitting: Physician Assistant

## 2021-04-04 DIAGNOSIS — R2681 Unsteadiness on feet: Secondary | ICD-10-CM | POA: Diagnosis not present

## 2021-04-04 DIAGNOSIS — M6281 Muscle weakness (generalized): Secondary | ICD-10-CM | POA: Diagnosis not present

## 2021-04-04 DIAGNOSIS — Z20828 Contact with and (suspected) exposure to other viral communicable diseases: Secondary | ICD-10-CM | POA: Diagnosis not present

## 2021-04-04 DIAGNOSIS — R278 Other lack of coordination: Secondary | ICD-10-CM | POA: Diagnosis not present

## 2021-04-04 DIAGNOSIS — R41841 Cognitive communication deficit: Secondary | ICD-10-CM | POA: Diagnosis not present

## 2021-04-04 DIAGNOSIS — R488 Other symbolic dysfunctions: Secondary | ICD-10-CM | POA: Diagnosis not present

## 2021-04-05 DIAGNOSIS — M6281 Muscle weakness (generalized): Secondary | ICD-10-CM | POA: Diagnosis not present

## 2021-04-05 DIAGNOSIS — R41841 Cognitive communication deficit: Secondary | ICD-10-CM | POA: Diagnosis not present

## 2021-04-05 DIAGNOSIS — R278 Other lack of coordination: Secondary | ICD-10-CM | POA: Diagnosis not present

## 2021-04-05 DIAGNOSIS — R2681 Unsteadiness on feet: Secondary | ICD-10-CM | POA: Diagnosis not present

## 2021-04-05 DIAGNOSIS — R488 Other symbolic dysfunctions: Secondary | ICD-10-CM | POA: Diagnosis not present

## 2021-04-06 DIAGNOSIS — R2681 Unsteadiness on feet: Secondary | ICD-10-CM | POA: Diagnosis not present

## 2021-04-06 DIAGNOSIS — E0510 Thyrotoxicosis with toxic single thyroid nodule without thyrotoxic crisis or storm: Secondary | ICD-10-CM | POA: Diagnosis not present

## 2021-04-06 DIAGNOSIS — E890 Postprocedural hypothyroidism: Secondary | ICD-10-CM | POA: Diagnosis not present

## 2021-04-06 DIAGNOSIS — M6281 Muscle weakness (generalized): Secondary | ICD-10-CM | POA: Diagnosis not present

## 2021-04-06 DIAGNOSIS — E042 Nontoxic multinodular goiter: Secondary | ICD-10-CM | POA: Diagnosis not present

## 2021-04-06 DIAGNOSIS — R41841 Cognitive communication deficit: Secondary | ICD-10-CM | POA: Diagnosis not present

## 2021-04-06 DIAGNOSIS — R488 Other symbolic dysfunctions: Secondary | ICD-10-CM | POA: Diagnosis not present

## 2021-04-06 DIAGNOSIS — R278 Other lack of coordination: Secondary | ICD-10-CM | POA: Diagnosis not present

## 2021-04-07 DIAGNOSIS — M6281 Muscle weakness (generalized): Secondary | ICD-10-CM | POA: Diagnosis not present

## 2021-04-07 DIAGNOSIS — R2681 Unsteadiness on feet: Secondary | ICD-10-CM | POA: Diagnosis not present

## 2021-04-07 DIAGNOSIS — Z20828 Contact with and (suspected) exposure to other viral communicable diseases: Secondary | ICD-10-CM | POA: Diagnosis not present

## 2021-04-07 DIAGNOSIS — R278 Other lack of coordination: Secondary | ICD-10-CM | POA: Diagnosis not present

## 2021-04-07 DIAGNOSIS — R41841 Cognitive communication deficit: Secondary | ICD-10-CM | POA: Diagnosis not present

## 2021-04-07 DIAGNOSIS — R488 Other symbolic dysfunctions: Secondary | ICD-10-CM | POA: Diagnosis not present

## 2021-04-08 DIAGNOSIS — R488 Other symbolic dysfunctions: Secondary | ICD-10-CM | POA: Diagnosis not present

## 2021-04-08 DIAGNOSIS — R2681 Unsteadiness on feet: Secondary | ICD-10-CM | POA: Diagnosis not present

## 2021-04-08 DIAGNOSIS — R278 Other lack of coordination: Secondary | ICD-10-CM | POA: Diagnosis not present

## 2021-04-08 DIAGNOSIS — R41841 Cognitive communication deficit: Secondary | ICD-10-CM | POA: Diagnosis not present

## 2021-04-08 DIAGNOSIS — M6281 Muscle weakness (generalized): Secondary | ICD-10-CM | POA: Diagnosis not present

## 2021-04-11 DIAGNOSIS — R278 Other lack of coordination: Secondary | ICD-10-CM | POA: Diagnosis not present

## 2021-04-11 DIAGNOSIS — Z20828 Contact with and (suspected) exposure to other viral communicable diseases: Secondary | ICD-10-CM | POA: Diagnosis not present

## 2021-04-11 DIAGNOSIS — M6281 Muscle weakness (generalized): Secondary | ICD-10-CM | POA: Diagnosis not present

## 2021-04-11 DIAGNOSIS — R488 Other symbolic dysfunctions: Secondary | ICD-10-CM | POA: Diagnosis not present

## 2021-04-11 DIAGNOSIS — R41841 Cognitive communication deficit: Secondary | ICD-10-CM | POA: Diagnosis not present

## 2021-04-11 DIAGNOSIS — R2681 Unsteadiness on feet: Secondary | ICD-10-CM | POA: Diagnosis not present

## 2021-04-12 DIAGNOSIS — R278 Other lack of coordination: Secondary | ICD-10-CM | POA: Diagnosis not present

## 2021-04-12 DIAGNOSIS — M6281 Muscle weakness (generalized): Secondary | ICD-10-CM | POA: Diagnosis not present

## 2021-04-12 DIAGNOSIS — R488 Other symbolic dysfunctions: Secondary | ICD-10-CM | POA: Diagnosis not present

## 2021-04-12 DIAGNOSIS — R2681 Unsteadiness on feet: Secondary | ICD-10-CM | POA: Diagnosis not present

## 2021-04-12 DIAGNOSIS — R41841 Cognitive communication deficit: Secondary | ICD-10-CM | POA: Diagnosis not present

## 2021-04-13 DIAGNOSIS — R2681 Unsteadiness on feet: Secondary | ICD-10-CM | POA: Diagnosis not present

## 2021-04-13 DIAGNOSIS — R488 Other symbolic dysfunctions: Secondary | ICD-10-CM | POA: Diagnosis not present

## 2021-04-13 DIAGNOSIS — R41841 Cognitive communication deficit: Secondary | ICD-10-CM | POA: Diagnosis not present

## 2021-04-13 DIAGNOSIS — M6281 Muscle weakness (generalized): Secondary | ICD-10-CM | POA: Diagnosis not present

## 2021-04-13 DIAGNOSIS — R278 Other lack of coordination: Secondary | ICD-10-CM | POA: Diagnosis not present

## 2021-04-14 DIAGNOSIS — R2681 Unsteadiness on feet: Secondary | ICD-10-CM | POA: Diagnosis not present

## 2021-04-14 DIAGNOSIS — M6281 Muscle weakness (generalized): Secondary | ICD-10-CM | POA: Diagnosis not present

## 2021-04-14 DIAGNOSIS — R488 Other symbolic dysfunctions: Secondary | ICD-10-CM | POA: Diagnosis not present

## 2021-04-14 DIAGNOSIS — R278 Other lack of coordination: Secondary | ICD-10-CM | POA: Diagnosis not present

## 2021-04-14 DIAGNOSIS — Z20828 Contact with and (suspected) exposure to other viral communicable diseases: Secondary | ICD-10-CM | POA: Diagnosis not present

## 2021-04-14 DIAGNOSIS — R41841 Cognitive communication deficit: Secondary | ICD-10-CM | POA: Diagnosis not present

## 2021-04-15 DIAGNOSIS — R2681 Unsteadiness on feet: Secondary | ICD-10-CM | POA: Diagnosis not present

## 2021-04-15 DIAGNOSIS — R488 Other symbolic dysfunctions: Secondary | ICD-10-CM | POA: Diagnosis not present

## 2021-04-15 DIAGNOSIS — R41841 Cognitive communication deficit: Secondary | ICD-10-CM | POA: Diagnosis not present

## 2021-04-15 DIAGNOSIS — R278 Other lack of coordination: Secondary | ICD-10-CM | POA: Diagnosis not present

## 2021-04-15 DIAGNOSIS — M6281 Muscle weakness (generalized): Secondary | ICD-10-CM | POA: Diagnosis not present

## 2021-04-17 DIAGNOSIS — Z20822 Contact with and (suspected) exposure to covid-19: Secondary | ICD-10-CM | POA: Diagnosis not present

## 2021-04-19 DIAGNOSIS — Z20828 Contact with and (suspected) exposure to other viral communicable diseases: Secondary | ICD-10-CM | POA: Diagnosis not present

## 2021-04-20 DIAGNOSIS — R488 Other symbolic dysfunctions: Secondary | ICD-10-CM | POA: Diagnosis not present

## 2021-04-20 DIAGNOSIS — R2681 Unsteadiness on feet: Secondary | ICD-10-CM | POA: Diagnosis not present

## 2021-04-20 DIAGNOSIS — R41841 Cognitive communication deficit: Secondary | ICD-10-CM | POA: Diagnosis not present

## 2021-04-20 DIAGNOSIS — R278 Other lack of coordination: Secondary | ICD-10-CM | POA: Diagnosis not present

## 2021-04-20 DIAGNOSIS — M6281 Muscle weakness (generalized): Secondary | ICD-10-CM | POA: Diagnosis not present

## 2021-04-21 DIAGNOSIS — Z20828 Contact with and (suspected) exposure to other viral communicable diseases: Secondary | ICD-10-CM | POA: Diagnosis not present

## 2021-04-21 DIAGNOSIS — R278 Other lack of coordination: Secondary | ICD-10-CM | POA: Diagnosis not present

## 2021-04-21 DIAGNOSIS — M6281 Muscle weakness (generalized): Secondary | ICD-10-CM | POA: Diagnosis not present

## 2021-04-21 DIAGNOSIS — R488 Other symbolic dysfunctions: Secondary | ICD-10-CM | POA: Diagnosis not present

## 2021-04-21 DIAGNOSIS — R2681 Unsteadiness on feet: Secondary | ICD-10-CM | POA: Diagnosis not present

## 2021-04-21 DIAGNOSIS — R41841 Cognitive communication deficit: Secondary | ICD-10-CM | POA: Diagnosis not present

## 2021-04-22 DIAGNOSIS — R2681 Unsteadiness on feet: Secondary | ICD-10-CM | POA: Diagnosis not present

## 2021-04-22 DIAGNOSIS — M6281 Muscle weakness (generalized): Secondary | ICD-10-CM | POA: Diagnosis not present

## 2021-04-22 DIAGNOSIS — R278 Other lack of coordination: Secondary | ICD-10-CM | POA: Diagnosis not present

## 2021-04-22 DIAGNOSIS — R41841 Cognitive communication deficit: Secondary | ICD-10-CM | POA: Diagnosis not present

## 2021-04-22 DIAGNOSIS — R488 Other symbolic dysfunctions: Secondary | ICD-10-CM | POA: Diagnosis not present

## 2021-04-25 DIAGNOSIS — R488 Other symbolic dysfunctions: Secondary | ICD-10-CM | POA: Diagnosis not present

## 2021-04-25 DIAGNOSIS — R278 Other lack of coordination: Secondary | ICD-10-CM | POA: Diagnosis not present

## 2021-04-25 DIAGNOSIS — M6281 Muscle weakness (generalized): Secondary | ICD-10-CM | POA: Diagnosis not present

## 2021-04-25 DIAGNOSIS — Z20828 Contact with and (suspected) exposure to other viral communicable diseases: Secondary | ICD-10-CM | POA: Diagnosis not present

## 2021-04-25 DIAGNOSIS — R41841 Cognitive communication deficit: Secondary | ICD-10-CM | POA: Diagnosis not present

## 2021-04-25 DIAGNOSIS — R2681 Unsteadiness on feet: Secondary | ICD-10-CM | POA: Diagnosis not present

## 2021-04-26 DIAGNOSIS — R488 Other symbolic dysfunctions: Secondary | ICD-10-CM | POA: Diagnosis not present

## 2021-04-26 DIAGNOSIS — M6281 Muscle weakness (generalized): Secondary | ICD-10-CM | POA: Diagnosis not present

## 2021-04-26 DIAGNOSIS — R2681 Unsteadiness on feet: Secondary | ICD-10-CM | POA: Diagnosis not present

## 2021-04-26 DIAGNOSIS — R41841 Cognitive communication deficit: Secondary | ICD-10-CM | POA: Diagnosis not present

## 2021-04-26 DIAGNOSIS — R278 Other lack of coordination: Secondary | ICD-10-CM | POA: Diagnosis not present

## 2021-04-27 DIAGNOSIS — R41841 Cognitive communication deficit: Secondary | ICD-10-CM | POA: Diagnosis not present

## 2021-04-27 DIAGNOSIS — R278 Other lack of coordination: Secondary | ICD-10-CM | POA: Diagnosis not present

## 2021-04-27 DIAGNOSIS — M6281 Muscle weakness (generalized): Secondary | ICD-10-CM | POA: Diagnosis not present

## 2021-04-27 DIAGNOSIS — R488 Other symbolic dysfunctions: Secondary | ICD-10-CM | POA: Diagnosis not present

## 2021-04-27 DIAGNOSIS — Z20822 Contact with and (suspected) exposure to covid-19: Secondary | ICD-10-CM | POA: Diagnosis not present

## 2021-04-27 DIAGNOSIS — R2681 Unsteadiness on feet: Secondary | ICD-10-CM | POA: Diagnosis not present

## 2021-04-28 DIAGNOSIS — M6281 Muscle weakness (generalized): Secondary | ICD-10-CM | POA: Diagnosis not present

## 2021-04-28 DIAGNOSIS — R2681 Unsteadiness on feet: Secondary | ICD-10-CM | POA: Diagnosis not present

## 2021-04-28 DIAGNOSIS — R278 Other lack of coordination: Secondary | ICD-10-CM | POA: Diagnosis not present

## 2021-04-28 DIAGNOSIS — R41841 Cognitive communication deficit: Secondary | ICD-10-CM | POA: Diagnosis not present

## 2021-04-28 DIAGNOSIS — R488 Other symbolic dysfunctions: Secondary | ICD-10-CM | POA: Diagnosis not present

## 2021-04-29 DIAGNOSIS — R41841 Cognitive communication deficit: Secondary | ICD-10-CM | POA: Diagnosis not present

## 2021-04-29 DIAGNOSIS — R278 Other lack of coordination: Secondary | ICD-10-CM | POA: Diagnosis not present

## 2021-04-29 DIAGNOSIS — R488 Other symbolic dysfunctions: Secondary | ICD-10-CM | POA: Diagnosis not present

## 2021-04-29 DIAGNOSIS — R2681 Unsteadiness on feet: Secondary | ICD-10-CM | POA: Diagnosis not present

## 2021-04-29 DIAGNOSIS — M6281 Muscle weakness (generalized): Secondary | ICD-10-CM | POA: Diagnosis not present

## 2021-04-30 DIAGNOSIS — M6281 Muscle weakness (generalized): Secondary | ICD-10-CM | POA: Diagnosis not present

## 2021-04-30 DIAGNOSIS — R2681 Unsteadiness on feet: Secondary | ICD-10-CM | POA: Diagnosis not present

## 2021-04-30 DIAGNOSIS — R278 Other lack of coordination: Secondary | ICD-10-CM | POA: Diagnosis not present

## 2021-04-30 DIAGNOSIS — R41841 Cognitive communication deficit: Secondary | ICD-10-CM | POA: Diagnosis not present

## 2021-04-30 DIAGNOSIS — R488 Other symbolic dysfunctions: Secondary | ICD-10-CM | POA: Diagnosis not present

## 2021-05-02 ENCOUNTER — Other Ambulatory Visit: Payer: Self-pay | Admitting: Family Medicine

## 2021-05-02 DIAGNOSIS — M6281 Muscle weakness (generalized): Secondary | ICD-10-CM | POA: Diagnosis not present

## 2021-05-02 DIAGNOSIS — R488 Other symbolic dysfunctions: Secondary | ICD-10-CM | POA: Diagnosis not present

## 2021-05-02 DIAGNOSIS — R2681 Unsteadiness on feet: Secondary | ICD-10-CM | POA: Diagnosis not present

## 2021-05-02 DIAGNOSIS — R41841 Cognitive communication deficit: Secondary | ICD-10-CM | POA: Diagnosis not present

## 2021-05-02 DIAGNOSIS — E876 Hypokalemia: Secondary | ICD-10-CM

## 2021-05-02 DIAGNOSIS — R278 Other lack of coordination: Secondary | ICD-10-CM | POA: Diagnosis not present

## 2021-05-03 DIAGNOSIS — M6281 Muscle weakness (generalized): Secondary | ICD-10-CM | POA: Diagnosis not present

## 2021-05-03 DIAGNOSIS — R278 Other lack of coordination: Secondary | ICD-10-CM | POA: Diagnosis not present

## 2021-05-03 DIAGNOSIS — R41841 Cognitive communication deficit: Secondary | ICD-10-CM | POA: Diagnosis not present

## 2021-05-03 DIAGNOSIS — R488 Other symbolic dysfunctions: Secondary | ICD-10-CM | POA: Diagnosis not present

## 2021-05-03 DIAGNOSIS — R2681 Unsteadiness on feet: Secondary | ICD-10-CM | POA: Diagnosis not present

## 2021-05-04 DIAGNOSIS — R488 Other symbolic dysfunctions: Secondary | ICD-10-CM | POA: Diagnosis not present

## 2021-05-04 DIAGNOSIS — R278 Other lack of coordination: Secondary | ICD-10-CM | POA: Diagnosis not present

## 2021-05-04 DIAGNOSIS — R41841 Cognitive communication deficit: Secondary | ICD-10-CM | POA: Diagnosis not present

## 2021-05-04 DIAGNOSIS — R2681 Unsteadiness on feet: Secondary | ICD-10-CM | POA: Diagnosis not present

## 2021-05-04 DIAGNOSIS — M6281 Muscle weakness (generalized): Secondary | ICD-10-CM | POA: Diagnosis not present

## 2021-05-05 DIAGNOSIS — R41841 Cognitive communication deficit: Secondary | ICD-10-CM | POA: Diagnosis not present

## 2021-05-05 DIAGNOSIS — R488 Other symbolic dysfunctions: Secondary | ICD-10-CM | POA: Diagnosis not present

## 2021-05-05 DIAGNOSIS — M6281 Muscle weakness (generalized): Secondary | ICD-10-CM | POA: Diagnosis not present

## 2021-05-05 DIAGNOSIS — R2681 Unsteadiness on feet: Secondary | ICD-10-CM | POA: Diagnosis not present

## 2021-05-05 DIAGNOSIS — R278 Other lack of coordination: Secondary | ICD-10-CM | POA: Diagnosis not present

## 2021-05-06 DIAGNOSIS — R41841 Cognitive communication deficit: Secondary | ICD-10-CM | POA: Diagnosis not present

## 2021-05-06 DIAGNOSIS — R488 Other symbolic dysfunctions: Secondary | ICD-10-CM | POA: Diagnosis not present

## 2021-05-06 DIAGNOSIS — R2681 Unsteadiness on feet: Secondary | ICD-10-CM | POA: Diagnosis not present

## 2021-05-06 DIAGNOSIS — M6281 Muscle weakness (generalized): Secondary | ICD-10-CM | POA: Diagnosis not present

## 2021-05-06 DIAGNOSIS — R278 Other lack of coordination: Secondary | ICD-10-CM | POA: Diagnosis not present

## 2021-05-07 DIAGNOSIS — R278 Other lack of coordination: Secondary | ICD-10-CM | POA: Diagnosis not present

## 2021-05-07 DIAGNOSIS — R41841 Cognitive communication deficit: Secondary | ICD-10-CM | POA: Diagnosis not present

## 2021-05-07 DIAGNOSIS — M6281 Muscle weakness (generalized): Secondary | ICD-10-CM | POA: Diagnosis not present

## 2021-05-07 DIAGNOSIS — R488 Other symbolic dysfunctions: Secondary | ICD-10-CM | POA: Diagnosis not present

## 2021-05-07 DIAGNOSIS — R2681 Unsteadiness on feet: Secondary | ICD-10-CM | POA: Diagnosis not present

## 2021-05-09 DIAGNOSIS — R278 Other lack of coordination: Secondary | ICD-10-CM | POA: Diagnosis not present

## 2021-05-09 DIAGNOSIS — R2681 Unsteadiness on feet: Secondary | ICD-10-CM | POA: Diagnosis not present

## 2021-05-09 DIAGNOSIS — R41841 Cognitive communication deficit: Secondary | ICD-10-CM | POA: Diagnosis not present

## 2021-05-09 DIAGNOSIS — R488 Other symbolic dysfunctions: Secondary | ICD-10-CM | POA: Diagnosis not present

## 2021-05-09 DIAGNOSIS — M6281 Muscle weakness (generalized): Secondary | ICD-10-CM | POA: Diagnosis not present

## 2021-05-10 DIAGNOSIS — R41841 Cognitive communication deficit: Secondary | ICD-10-CM | POA: Diagnosis not present

## 2021-05-10 DIAGNOSIS — R278 Other lack of coordination: Secondary | ICD-10-CM | POA: Diagnosis not present

## 2021-05-10 DIAGNOSIS — R2681 Unsteadiness on feet: Secondary | ICD-10-CM | POA: Diagnosis not present

## 2021-05-10 DIAGNOSIS — R488 Other symbolic dysfunctions: Secondary | ICD-10-CM | POA: Diagnosis not present

## 2021-05-10 DIAGNOSIS — M6281 Muscle weakness (generalized): Secondary | ICD-10-CM | POA: Diagnosis not present

## 2021-05-11 DIAGNOSIS — R278 Other lack of coordination: Secondary | ICD-10-CM | POA: Diagnosis not present

## 2021-05-11 DIAGNOSIS — R2681 Unsteadiness on feet: Secondary | ICD-10-CM | POA: Diagnosis not present

## 2021-05-11 DIAGNOSIS — R41841 Cognitive communication deficit: Secondary | ICD-10-CM | POA: Diagnosis not present

## 2021-05-11 DIAGNOSIS — R488 Other symbolic dysfunctions: Secondary | ICD-10-CM | POA: Diagnosis not present

## 2021-05-11 DIAGNOSIS — M6281 Muscle weakness (generalized): Secondary | ICD-10-CM | POA: Diagnosis not present

## 2021-05-12 DIAGNOSIS — R278 Other lack of coordination: Secondary | ICD-10-CM | POA: Diagnosis not present

## 2021-05-12 DIAGNOSIS — Z20828 Contact with and (suspected) exposure to other viral communicable diseases: Secondary | ICD-10-CM | POA: Diagnosis not present

## 2021-05-12 DIAGNOSIS — R488 Other symbolic dysfunctions: Secondary | ICD-10-CM | POA: Diagnosis not present

## 2021-05-12 DIAGNOSIS — M6281 Muscle weakness (generalized): Secondary | ICD-10-CM | POA: Diagnosis not present

## 2021-05-12 DIAGNOSIS — R41841 Cognitive communication deficit: Secondary | ICD-10-CM | POA: Diagnosis not present

## 2021-05-12 DIAGNOSIS — R2681 Unsteadiness on feet: Secondary | ICD-10-CM | POA: Diagnosis not present

## 2021-05-16 DIAGNOSIS — R488 Other symbolic dysfunctions: Secondary | ICD-10-CM | POA: Diagnosis not present

## 2021-05-16 DIAGNOSIS — R41841 Cognitive communication deficit: Secondary | ICD-10-CM | POA: Diagnosis not present

## 2021-05-16 DIAGNOSIS — R278 Other lack of coordination: Secondary | ICD-10-CM | POA: Diagnosis not present

## 2021-05-16 DIAGNOSIS — M6281 Muscle weakness (generalized): Secondary | ICD-10-CM | POA: Diagnosis not present

## 2021-05-16 DIAGNOSIS — Z20822 Contact with and (suspected) exposure to covid-19: Secondary | ICD-10-CM | POA: Diagnosis not present

## 2021-05-16 DIAGNOSIS — R2681 Unsteadiness on feet: Secondary | ICD-10-CM | POA: Diagnosis not present

## 2021-05-16 DIAGNOSIS — M25511 Pain in right shoulder: Secondary | ICD-10-CM | POA: Diagnosis not present

## 2021-05-17 DIAGNOSIS — R2681 Unsteadiness on feet: Secondary | ICD-10-CM | POA: Diagnosis not present

## 2021-05-17 DIAGNOSIS — R488 Other symbolic dysfunctions: Secondary | ICD-10-CM | POA: Diagnosis not present

## 2021-05-17 DIAGNOSIS — R278 Other lack of coordination: Secondary | ICD-10-CM | POA: Diagnosis not present

## 2021-05-17 DIAGNOSIS — M6281 Muscle weakness (generalized): Secondary | ICD-10-CM | POA: Diagnosis not present

## 2021-05-17 DIAGNOSIS — R41841 Cognitive communication deficit: Secondary | ICD-10-CM | POA: Diagnosis not present

## 2021-05-18 DIAGNOSIS — Z20822 Contact with and (suspected) exposure to covid-19: Secondary | ICD-10-CM | POA: Diagnosis not present

## 2021-05-18 DIAGNOSIS — R488 Other symbolic dysfunctions: Secondary | ICD-10-CM | POA: Diagnosis not present

## 2021-05-18 DIAGNOSIS — R278 Other lack of coordination: Secondary | ICD-10-CM | POA: Diagnosis not present

## 2021-05-18 DIAGNOSIS — R2681 Unsteadiness on feet: Secondary | ICD-10-CM | POA: Diagnosis not present

## 2021-05-18 DIAGNOSIS — R41841 Cognitive communication deficit: Secondary | ICD-10-CM | POA: Diagnosis not present

## 2021-05-18 DIAGNOSIS — M6281 Muscle weakness (generalized): Secondary | ICD-10-CM | POA: Diagnosis not present

## 2021-05-19 DIAGNOSIS — R41841 Cognitive communication deficit: Secondary | ICD-10-CM | POA: Diagnosis not present

## 2021-05-19 DIAGNOSIS — R2681 Unsteadiness on feet: Secondary | ICD-10-CM | POA: Diagnosis not present

## 2021-05-19 DIAGNOSIS — R278 Other lack of coordination: Secondary | ICD-10-CM | POA: Diagnosis not present

## 2021-05-19 DIAGNOSIS — Z20828 Contact with and (suspected) exposure to other viral communicable diseases: Secondary | ICD-10-CM | POA: Diagnosis not present

## 2021-05-19 DIAGNOSIS — M6281 Muscle weakness (generalized): Secondary | ICD-10-CM | POA: Diagnosis not present

## 2021-05-19 DIAGNOSIS — R488 Other symbolic dysfunctions: Secondary | ICD-10-CM | POA: Diagnosis not present

## 2021-05-20 DIAGNOSIS — R278 Other lack of coordination: Secondary | ICD-10-CM | POA: Diagnosis not present

## 2021-05-20 DIAGNOSIS — M6281 Muscle weakness (generalized): Secondary | ICD-10-CM | POA: Diagnosis not present

## 2021-05-20 DIAGNOSIS — R41841 Cognitive communication deficit: Secondary | ICD-10-CM | POA: Diagnosis not present

## 2021-05-20 DIAGNOSIS — R488 Other symbolic dysfunctions: Secondary | ICD-10-CM | POA: Diagnosis not present

## 2021-05-20 DIAGNOSIS — R2681 Unsteadiness on feet: Secondary | ICD-10-CM | POA: Diagnosis not present

## 2021-05-23 DIAGNOSIS — Z20828 Contact with and (suspected) exposure to other viral communicable diseases: Secondary | ICD-10-CM | POA: Diagnosis not present

## 2021-05-23 DIAGNOSIS — M6281 Muscle weakness (generalized): Secondary | ICD-10-CM | POA: Diagnosis not present

## 2021-05-23 DIAGNOSIS — R41841 Cognitive communication deficit: Secondary | ICD-10-CM | POA: Diagnosis not present

## 2021-05-23 DIAGNOSIS — R278 Other lack of coordination: Secondary | ICD-10-CM | POA: Diagnosis not present

## 2021-05-23 DIAGNOSIS — R488 Other symbolic dysfunctions: Secondary | ICD-10-CM | POA: Diagnosis not present

## 2021-05-23 DIAGNOSIS — R2681 Unsteadiness on feet: Secondary | ICD-10-CM | POA: Diagnosis not present

## 2021-05-24 DIAGNOSIS — R41841 Cognitive communication deficit: Secondary | ICD-10-CM | POA: Diagnosis not present

## 2021-05-24 DIAGNOSIS — R278 Other lack of coordination: Secondary | ICD-10-CM | POA: Diagnosis not present

## 2021-05-24 DIAGNOSIS — M6281 Muscle weakness (generalized): Secondary | ICD-10-CM | POA: Diagnosis not present

## 2021-05-24 DIAGNOSIS — R2681 Unsteadiness on feet: Secondary | ICD-10-CM | POA: Diagnosis not present

## 2021-05-24 DIAGNOSIS — R488 Other symbolic dysfunctions: Secondary | ICD-10-CM | POA: Diagnosis not present

## 2021-05-25 DIAGNOSIS — R278 Other lack of coordination: Secondary | ICD-10-CM | POA: Diagnosis not present

## 2021-05-25 DIAGNOSIS — R2681 Unsteadiness on feet: Secondary | ICD-10-CM | POA: Diagnosis not present

## 2021-05-25 DIAGNOSIS — R488 Other symbolic dysfunctions: Secondary | ICD-10-CM | POA: Diagnosis not present

## 2021-05-25 DIAGNOSIS — R41841 Cognitive communication deficit: Secondary | ICD-10-CM | POA: Diagnosis not present

## 2021-05-25 DIAGNOSIS — M6281 Muscle weakness (generalized): Secondary | ICD-10-CM | POA: Diagnosis not present

## 2021-05-26 DIAGNOSIS — Z20828 Contact with and (suspected) exposure to other viral communicable diseases: Secondary | ICD-10-CM | POA: Diagnosis not present

## 2021-05-26 DIAGNOSIS — R488 Other symbolic dysfunctions: Secondary | ICD-10-CM | POA: Diagnosis not present

## 2021-05-26 DIAGNOSIS — R2681 Unsteadiness on feet: Secondary | ICD-10-CM | POA: Diagnosis not present

## 2021-05-26 DIAGNOSIS — M6281 Muscle weakness (generalized): Secondary | ICD-10-CM | POA: Diagnosis not present

## 2021-05-26 DIAGNOSIS — R278 Other lack of coordination: Secondary | ICD-10-CM | POA: Diagnosis not present

## 2021-05-26 DIAGNOSIS — R41841 Cognitive communication deficit: Secondary | ICD-10-CM | POA: Diagnosis not present

## 2021-05-27 DIAGNOSIS — R278 Other lack of coordination: Secondary | ICD-10-CM | POA: Diagnosis not present

## 2021-05-27 DIAGNOSIS — R41841 Cognitive communication deficit: Secondary | ICD-10-CM | POA: Diagnosis not present

## 2021-05-27 DIAGNOSIS — R2681 Unsteadiness on feet: Secondary | ICD-10-CM | POA: Diagnosis not present

## 2021-05-27 DIAGNOSIS — R488 Other symbolic dysfunctions: Secondary | ICD-10-CM | POA: Diagnosis not present

## 2021-05-27 DIAGNOSIS — M6281 Muscle weakness (generalized): Secondary | ICD-10-CM | POA: Diagnosis not present

## 2021-05-30 DIAGNOSIS — R278 Other lack of coordination: Secondary | ICD-10-CM | POA: Diagnosis not present

## 2021-05-30 DIAGNOSIS — M6281 Muscle weakness (generalized): Secondary | ICD-10-CM | POA: Diagnosis not present

## 2021-05-30 DIAGNOSIS — R2681 Unsteadiness on feet: Secondary | ICD-10-CM | POA: Diagnosis not present

## 2021-05-30 DIAGNOSIS — R41841 Cognitive communication deficit: Secondary | ICD-10-CM | POA: Diagnosis not present

## 2021-05-30 DIAGNOSIS — R488 Other symbolic dysfunctions: Secondary | ICD-10-CM | POA: Diagnosis not present

## 2021-05-31 DIAGNOSIS — R278 Other lack of coordination: Secondary | ICD-10-CM | POA: Diagnosis not present

## 2021-05-31 DIAGNOSIS — M6281 Muscle weakness (generalized): Secondary | ICD-10-CM | POA: Diagnosis not present

## 2021-05-31 DIAGNOSIS — R41841 Cognitive communication deficit: Secondary | ICD-10-CM | POA: Diagnosis not present

## 2021-05-31 DIAGNOSIS — R2681 Unsteadiness on feet: Secondary | ICD-10-CM | POA: Diagnosis not present

## 2021-05-31 DIAGNOSIS — R488 Other symbolic dysfunctions: Secondary | ICD-10-CM | POA: Diagnosis not present

## 2021-06-01 ENCOUNTER — Other Ambulatory Visit: Payer: Self-pay | Admitting: Family Medicine

## 2021-06-01 DIAGNOSIS — R488 Other symbolic dysfunctions: Secondary | ICD-10-CM | POA: Diagnosis not present

## 2021-06-01 DIAGNOSIS — R41841 Cognitive communication deficit: Secondary | ICD-10-CM | POA: Diagnosis not present

## 2021-06-01 DIAGNOSIS — R2681 Unsteadiness on feet: Secondary | ICD-10-CM | POA: Diagnosis not present

## 2021-06-01 DIAGNOSIS — R278 Other lack of coordination: Secondary | ICD-10-CM | POA: Diagnosis not present

## 2021-06-01 DIAGNOSIS — M6281 Muscle weakness (generalized): Secondary | ICD-10-CM | POA: Diagnosis not present

## 2021-06-02 DIAGNOSIS — R278 Other lack of coordination: Secondary | ICD-10-CM | POA: Diagnosis not present

## 2021-06-02 DIAGNOSIS — R488 Other symbolic dysfunctions: Secondary | ICD-10-CM | POA: Diagnosis not present

## 2021-06-02 DIAGNOSIS — M6281 Muscle weakness (generalized): Secondary | ICD-10-CM | POA: Diagnosis not present

## 2021-06-02 DIAGNOSIS — Z8616 Personal history of COVID-19: Secondary | ICD-10-CM | POA: Diagnosis not present

## 2021-06-02 DIAGNOSIS — R2681 Unsteadiness on feet: Secondary | ICD-10-CM | POA: Diagnosis not present

## 2021-06-02 DIAGNOSIS — R41841 Cognitive communication deficit: Secondary | ICD-10-CM | POA: Diagnosis not present

## 2021-06-03 DIAGNOSIS — M6281 Muscle weakness (generalized): Secondary | ICD-10-CM | POA: Diagnosis not present

## 2021-06-03 DIAGNOSIS — R2681 Unsteadiness on feet: Secondary | ICD-10-CM | POA: Diagnosis not present

## 2021-06-03 DIAGNOSIS — R488 Other symbolic dysfunctions: Secondary | ICD-10-CM | POA: Diagnosis not present

## 2021-06-03 DIAGNOSIS — R41841 Cognitive communication deficit: Secondary | ICD-10-CM | POA: Diagnosis not present

## 2021-06-03 DIAGNOSIS — R278 Other lack of coordination: Secondary | ICD-10-CM | POA: Diagnosis not present

## 2021-06-06 DIAGNOSIS — Z20828 Contact with and (suspected) exposure to other viral communicable diseases: Secondary | ICD-10-CM | POA: Diagnosis not present

## 2021-06-06 DIAGNOSIS — R2681 Unsteadiness on feet: Secondary | ICD-10-CM | POA: Diagnosis not present

## 2021-06-06 DIAGNOSIS — R488 Other symbolic dysfunctions: Secondary | ICD-10-CM | POA: Diagnosis not present

## 2021-06-06 DIAGNOSIS — R41841 Cognitive communication deficit: Secondary | ICD-10-CM | POA: Diagnosis not present

## 2021-06-06 DIAGNOSIS — M6281 Muscle weakness (generalized): Secondary | ICD-10-CM | POA: Diagnosis not present

## 2021-06-06 DIAGNOSIS — R278 Other lack of coordination: Secondary | ICD-10-CM | POA: Diagnosis not present

## 2021-06-07 DIAGNOSIS — R2681 Unsteadiness on feet: Secondary | ICD-10-CM | POA: Diagnosis not present

## 2021-06-07 DIAGNOSIS — R488 Other symbolic dysfunctions: Secondary | ICD-10-CM | POA: Diagnosis not present

## 2021-06-07 DIAGNOSIS — M6281 Muscle weakness (generalized): Secondary | ICD-10-CM | POA: Diagnosis not present

## 2021-06-07 DIAGNOSIS — R278 Other lack of coordination: Secondary | ICD-10-CM | POA: Diagnosis not present

## 2021-06-07 DIAGNOSIS — R41841 Cognitive communication deficit: Secondary | ICD-10-CM | POA: Diagnosis not present

## 2021-06-09 DIAGNOSIS — Z20828 Contact with and (suspected) exposure to other viral communicable diseases: Secondary | ICD-10-CM | POA: Diagnosis not present

## 2021-06-09 DIAGNOSIS — R41841 Cognitive communication deficit: Secondary | ICD-10-CM | POA: Diagnosis not present

## 2021-06-09 DIAGNOSIS — R488 Other symbolic dysfunctions: Secondary | ICD-10-CM | POA: Diagnosis not present

## 2021-06-10 DIAGNOSIS — R41841 Cognitive communication deficit: Secondary | ICD-10-CM | POA: Diagnosis not present

## 2021-06-10 DIAGNOSIS — R488 Other symbolic dysfunctions: Secondary | ICD-10-CM | POA: Diagnosis not present

## 2021-06-13 DIAGNOSIS — H6123 Impacted cerumen, bilateral: Secondary | ICD-10-CM | POA: Diagnosis not present

## 2021-06-13 DIAGNOSIS — Z20828 Contact with and (suspected) exposure to other viral communicable diseases: Secondary | ICD-10-CM | POA: Diagnosis not present

## 2021-06-14 DIAGNOSIS — R41841 Cognitive communication deficit: Secondary | ICD-10-CM | POA: Diagnosis not present

## 2021-06-14 DIAGNOSIS — R488 Other symbolic dysfunctions: Secondary | ICD-10-CM | POA: Diagnosis not present

## 2021-06-15 DIAGNOSIS — R488 Other symbolic dysfunctions: Secondary | ICD-10-CM | POA: Diagnosis not present

## 2021-06-15 DIAGNOSIS — R41841 Cognitive communication deficit: Secondary | ICD-10-CM | POA: Diagnosis not present

## 2021-06-16 DIAGNOSIS — Z8616 Personal history of COVID-19: Secondary | ICD-10-CM | POA: Diagnosis not present

## 2021-06-17 DIAGNOSIS — R488 Other symbolic dysfunctions: Secondary | ICD-10-CM | POA: Diagnosis not present

## 2021-06-17 DIAGNOSIS — R41841 Cognitive communication deficit: Secondary | ICD-10-CM | POA: Diagnosis not present

## 2021-06-21 DIAGNOSIS — R488 Other symbolic dysfunctions: Secondary | ICD-10-CM | POA: Diagnosis not present

## 2021-06-21 DIAGNOSIS — R41841 Cognitive communication deficit: Secondary | ICD-10-CM | POA: Diagnosis not present

## 2021-06-23 DIAGNOSIS — R41841 Cognitive communication deficit: Secondary | ICD-10-CM | POA: Diagnosis not present

## 2021-06-23 DIAGNOSIS — Z20828 Contact with and (suspected) exposure to other viral communicable diseases: Secondary | ICD-10-CM | POA: Diagnosis not present

## 2021-06-23 DIAGNOSIS — R488 Other symbolic dysfunctions: Secondary | ICD-10-CM | POA: Diagnosis not present

## 2021-06-24 DIAGNOSIS — R41841 Cognitive communication deficit: Secondary | ICD-10-CM | POA: Diagnosis not present

## 2021-06-24 DIAGNOSIS — R488 Other symbolic dysfunctions: Secondary | ICD-10-CM | POA: Diagnosis not present

## 2021-06-27 DIAGNOSIS — Z20828 Contact with and (suspected) exposure to other viral communicable diseases: Secondary | ICD-10-CM | POA: Diagnosis not present

## 2021-06-28 DIAGNOSIS — R41841 Cognitive communication deficit: Secondary | ICD-10-CM | POA: Diagnosis not present

## 2021-06-28 DIAGNOSIS — R488 Other symbolic dysfunctions: Secondary | ICD-10-CM | POA: Diagnosis not present

## 2021-06-29 DIAGNOSIS — R488 Other symbolic dysfunctions: Secondary | ICD-10-CM | POA: Diagnosis not present

## 2021-06-29 DIAGNOSIS — R41841 Cognitive communication deficit: Secondary | ICD-10-CM | POA: Diagnosis not present

## 2021-06-30 DIAGNOSIS — Z23 Encounter for immunization: Secondary | ICD-10-CM | POA: Diagnosis not present

## 2021-06-30 DIAGNOSIS — Z20828 Contact with and (suspected) exposure to other viral communicable diseases: Secondary | ICD-10-CM | POA: Diagnosis not present

## 2021-07-01 DIAGNOSIS — R41841 Cognitive communication deficit: Secondary | ICD-10-CM | POA: Diagnosis not present

## 2021-07-01 DIAGNOSIS — R488 Other symbolic dysfunctions: Secondary | ICD-10-CM | POA: Diagnosis not present

## 2021-07-04 DIAGNOSIS — Z20828 Contact with and (suspected) exposure to other viral communicable diseases: Secondary | ICD-10-CM | POA: Diagnosis not present

## 2021-07-05 DIAGNOSIS — Z20822 Contact with and (suspected) exposure to covid-19: Secondary | ICD-10-CM | POA: Diagnosis not present

## 2021-07-07 DIAGNOSIS — R41841 Cognitive communication deficit: Secondary | ICD-10-CM | POA: Diagnosis not present

## 2021-07-07 DIAGNOSIS — Z20828 Contact with and (suspected) exposure to other viral communicable diseases: Secondary | ICD-10-CM | POA: Diagnosis not present

## 2021-07-07 DIAGNOSIS — R488 Other symbolic dysfunctions: Secondary | ICD-10-CM | POA: Diagnosis not present

## 2021-07-09 DIAGNOSIS — R41841 Cognitive communication deficit: Secondary | ICD-10-CM | POA: Diagnosis not present

## 2021-07-09 DIAGNOSIS — R488 Other symbolic dysfunctions: Secondary | ICD-10-CM | POA: Diagnosis not present

## 2021-07-12 DIAGNOSIS — R488 Other symbolic dysfunctions: Secondary | ICD-10-CM | POA: Diagnosis not present

## 2021-07-12 DIAGNOSIS — R41841 Cognitive communication deficit: Secondary | ICD-10-CM | POA: Diagnosis not present

## 2021-07-13 ENCOUNTER — Ambulatory Visit: Payer: Medicare Other | Admitting: Physician Assistant

## 2021-07-13 DIAGNOSIS — Z20822 Contact with and (suspected) exposure to covid-19: Secondary | ICD-10-CM | POA: Diagnosis not present

## 2021-07-14 DIAGNOSIS — Z8616 Personal history of COVID-19: Secondary | ICD-10-CM | POA: Diagnosis not present

## 2021-07-15 DIAGNOSIS — R41841 Cognitive communication deficit: Secondary | ICD-10-CM | POA: Diagnosis not present

## 2021-07-15 DIAGNOSIS — R488 Other symbolic dysfunctions: Secondary | ICD-10-CM | POA: Diagnosis not present

## 2021-07-18 DIAGNOSIS — R41841 Cognitive communication deficit: Secondary | ICD-10-CM | POA: Diagnosis not present

## 2021-07-18 DIAGNOSIS — R488 Other symbolic dysfunctions: Secondary | ICD-10-CM | POA: Diagnosis not present

## 2021-07-21 DIAGNOSIS — R488 Other symbolic dysfunctions: Secondary | ICD-10-CM | POA: Diagnosis not present

## 2021-07-21 DIAGNOSIS — Z20828 Contact with and (suspected) exposure to other viral communicable diseases: Secondary | ICD-10-CM | POA: Diagnosis not present

## 2021-07-21 DIAGNOSIS — R41841 Cognitive communication deficit: Secondary | ICD-10-CM | POA: Diagnosis not present

## 2021-07-25 DIAGNOSIS — R41841 Cognitive communication deficit: Secondary | ICD-10-CM | POA: Diagnosis not present

## 2021-07-25 DIAGNOSIS — Z20828 Contact with and (suspected) exposure to other viral communicable diseases: Secondary | ICD-10-CM | POA: Diagnosis not present

## 2021-07-25 DIAGNOSIS — R488 Other symbolic dysfunctions: Secondary | ICD-10-CM | POA: Diagnosis not present

## 2021-08-08 DIAGNOSIS — Z20822 Contact with and (suspected) exposure to covid-19: Secondary | ICD-10-CM | POA: Diagnosis not present

## 2021-08-09 DIAGNOSIS — Z20822 Contact with and (suspected) exposure to covid-19: Secondary | ICD-10-CM | POA: Diagnosis not present

## 2021-08-20 ENCOUNTER — Other Ambulatory Visit: Payer: Self-pay | Admitting: Family Medicine

## 2021-08-23 ENCOUNTER — Ambulatory Visit: Payer: Medicare Other

## 2021-08-29 ENCOUNTER — Ambulatory Visit (INDEPENDENT_AMBULATORY_CARE_PROVIDER_SITE_OTHER): Payer: Medicare Other | Admitting: Physician Assistant

## 2021-08-29 ENCOUNTER — Other Ambulatory Visit: Payer: Self-pay

## 2021-08-29 VITALS — BP 127/80 | HR 73 | Resp 18 | Ht 62.0 in | Wt 158.0 lb

## 2021-08-29 DIAGNOSIS — F0390 Unspecified dementia without behavioral disturbance: Secondary | ICD-10-CM | POA: Diagnosis not present

## 2021-08-29 DIAGNOSIS — Z23 Encounter for immunization: Secondary | ICD-10-CM | POA: Diagnosis not present

## 2021-08-29 NOTE — Progress Notes (Signed)
Assessment/Plan:   Dementia, mild     Follow Up Instructions:  Continue rivastigmine to 3 mg twice daily, as she is tolerating it well Continue to practice safety both in and out of the home. Discussed the importance of regular daily schedule with inclusion of crossword puzzles to maintain brain function.  Continue to monitor mood  Increase the level of activity, exercising  at least 30 minutes at least 3 times a week.   Naps should be scheduled and should be no longer than 60 minutes and should not occur after 2 PM.  Mediterranean diet is recommended  Follow up in 6 months   Subjective:   Felicia Kelly was seen today in follow up for mild dementia last MMSE score was 27/30, she is on rivastigmine 3 mg twice daily, tolerating well.  She is accompanied in the office by her Felicia Kelly, who supplements the history.  Prior records and outside records available were reviewed prior to this visit.  Felicia Kelly states that her memory may have "slipped a little, does not remember as quickly, constant repeat ", but "nothing too drastic ".  She reports "feeling fine, and the memory is good ".  The patient lives at [facility] [place], she is very happy there, she is constantly doing activities.  She does not like doing physical activities, but she does enjoy the other opportunities that the facility has.  Physical therapy ended in October, and she decided to not have any further PT as she does not enjoy.  She sleeps well, about 8 to 9 hours a night, without naps during the day, no sleepwalking, or REM behavior.  No hallucinations or paranoia.  Her mood is good since moving to [employer] [facility].  She denies any dizziness, headaches, she ambulates without difficulty, denies unilateral weakness or pain, appetite is good, she admits craving for sweets.  She denies any dysphagia.  She no longer cooks.  She denies any diarrhea or constipation, or bladder dysfunction.  No tremors are reported.  She does not  wander off, and denies leaving objects in unusual places, reminding herself to place the keys in a small dish next to her night table without any issues.  She is independent of bathing and dressing although she has to be reminded to shower, she cannot remember.      " History of Present Illness 02/14/2019:  This is an 85 year old right-handed woman with a history of hypertension, hyperlipidemia, hypothyroidism, presenting for evaluation of worsening memory. Memory concerns were raised by family, patient has no insight into her condition and was upset throughout most of the visit (but participated). She states "I forget things, but if you are asking if I have dementia, I don't." She thinks her memory is good. Family started noticing memory changes for several years, worsening recently where she is very forgetful, repeating things constantly. They got 10 Christmas cards from her because she did not remember sending them. Felicia Kelly reports "she has trouble admitting she has issues." She has been living alone for the past 9 years since her husband passed away. She manages finances and medications and denies any difficulties doing these, although Felicia Kelly reports that they had to help her last March because she could not get her taxes together, she seemed to have trouble keeping up with things. She continues to drive and denies getting lost. Felicia Kelly reports she misplaces things frequently (she denies this). Felicia Kelly has noticed more irritability since they approached her about memory concerns last March, no  paranoia or hallucinations. She is independent with dressing and bathing, no hygiene concerns, house is well-kept. No family history of dementia, no history of significant head injuries or alcohol use.   She denies any headaches, dizziness, diplopia, dysarthria/dysphagia, neck/back pain, focal numbness/tingling/weakness, bowel/bladder dysfunction, anosmia, or tremors. Sleep is good. She denies any falls."       PREVIOUS MEDICATIONS: Donezepil d/c 05/21/2019  CURRENT MEDICATIONS:  Outpatient Encounter Medications as of 08/29/2021  Medication Sig   aspirin EC 81 MG tablet Take 81 mg by mouth every evening. Swallow whole.   Cholecalciferol (VITAMIN D3 PO) Take 1,000 Units by mouth 3 (three) times a week.   cyanocobalamin 100 MCG tablet Take by mouth.   ezetimibe (ZETIA) 10 MG tablet TAKE 1 TABLET BY MOUTH DAILY.   levothyroxine (SYNTHROID) 50 MCG tablet TAKE 1 TABLET ONCE DAILY BEFORE BREAKFAST.   losartan-hydrochlorothiazide (HYZAAR) 100-12.5 MG tablet TAKE ONE TABLET BY MOUTH ONCE DAILY **NEED OFFICE VISIT**   Magnesium 250 MG TABS Take by mouth.   MELATONIN PO Take by mouth every evening.   METAMUCIL FIBER PO Take by mouth every evening.   metFORMIN (GLUCOPHAGE-XR) 500 MG 24 hr tablet TAKE ONE TABLET BY MOUTH DAILY WITH BREAKFAST **NEED OFFICE VISIT**   Multiple Vitamin (MULTIVITAMIN) tablet Take 1 tablet by mouth daily.   OVER THE COUNTER MEDICATION Occuvite-every morning   potassium chloride (KLOR-CON) 10 MEQ tablet TAKE 1 TABLET THREE TIMES WEEKLY.   pravastatin (PRAVACHOL) 40 MG tablet TAKE 1 TABLET ONCE DAILY.   Probiotic Product (PROBIOTIC PO) Take by mouth every evening.   rivastigmine (EXELON) 3 MG capsule Take 1 capsule (3 mg total) by mouth 2 (two) times daily.   valACYclovir (VALTREX) 500 MG tablet TAKE 1 TABLET ONCE DAILY.   No facility-administered encounter medications on file as of 08/29/2021.     Objective:     PHYSICAL EXAMINATION:    VITALS:   Vitals:   08/29/21 1311  BP: 127/80  Pulse: 73  Resp: 18  SpO2: 97%  Weight: 158 lb (71.7 kg)  Height: 5\' 2"  (1.575 m)     GEN:  The patient appears stated age and is in NAD. HEENT:  Normocephalic, atraumatic.   Neurological examination: Orientation: The patient is alert. Oriented to person, place and date  Cranial nerves: There is good facial symmetry.The speech is fluent and clear.  Hearing is intact to  conversational tone.  No nystagmus is noted.  No incoordination of finger-to-nose testing.  Negative Romberg test.  Reflexes intact. Sensation: Sensation is intact to light touch throughout Motor: Strength is at least antigravity x4. Movement examination: Tone: There is normal tone in the UE/LE Abnormal movements:  no tremor.  No myoclonus.  No asterixis.   Coordination:  There is no decremation with RAM's. Gait and Station: The patient has no difficulty arising out of a deep-seated chair without the use of the hands. The patient's stride length is good.     MMSE - Mini Mental State Exam 01/11/2021 05/14/2018  Orientation to time 5 5  Orientation to Place 5 5  Registration 3 3  Attention/ Calculation 5 4  Recall 1 2  Language- name 2 objects 2 2  Language- repeat 1 1  Language- follow 3 step command 3 3  Language- read & follow direction 1 1  Write a sentence 1 1  Copy design 0 1  Total score 27 28      CBC    Component Value Date/Time   WBC 8.0 02/22/2021  1626   RBC 4.18 02/22/2021 1626   HGB 12.2 02/22/2021 1626   HCT 38.2 02/22/2021 1626   PLT 279 02/22/2021 1626   MCV 91.4 02/22/2021 1626   MCH 29.2 02/22/2021 1626   MCHC 31.9 02/22/2021 1626   RDW 14.2 02/22/2021 1626   LYMPHSABS 2,543 09/03/2020 1157   MONOABS 0.8 02/18/2020 1944   EOSABS 328 09/03/2020 1157   BASOSABS 39 09/03/2020 1157     CMP Latest Ref Rng & Units 03/04/2021 02/22/2021 09/03/2020  Glucose 70 - 99 mg/dL 127(H) 96 102(H)  BUN 6 - 23 mg/dL 16 20 21   Creatinine 0.40 - 1.20 mg/dL 0.82 0.90 0.80  Sodium 135 - 145 mEq/L 141 142 142  Potassium 3.5 - 5.1 mEq/L 3.8 3.4(L) 3.7  Chloride 96 - 112 mEq/L 104 107 104  CO2 19 - 32 mEq/L 28 28 23   Calcium 8.4 - 10.5 mg/dL 9.7 9.3 9.7  Total Protein 6.5 - 8.1 g/dL - 7.1 -  Total Bilirubin 0.3 - 1.2 mg/dL - 0.4 -  Alkaline Phos 38 - 126 U/L - 64 -  AST 15 - 41 U/L - 27 -  ALT 0 - 44 U/L - 27 -       Total time spent on today's visit was 40 minutes,  including both face-to-face time and nonface-to-face time.  Time included that spent on review of records (prior notes available to me/labs/imaging if pertinent), discussing treatment and goals, answering patient's questions and coordinating care.  Cc:  Caren Macadam, MD Sharene Butters, PA-C

## 2021-08-29 NOTE — Patient Instructions (Signed)
Good to see you!  1.Continue Rivastigmine to 3mg  twice a day  2. Continue with physical exercise, brain stimulation exercises, control of blood pressure, cholesterol, sugar levels  3. Follow-up in 6 months, call for any changes   FALL PRECAUTIONS: Be cautious when walking. Scan the area for obstacles that may increase the risk of trips and falls. When getting up in the mornings, sit up at the edge of the bed for a few minutes before getting out of bed. Consider elevating the bed at the head end to avoid drop of blood pressure when getting up. Walk always in a well-lit room (use night lights in the walls). Avoid area rugs or power cords from appliances in the middle of the walkways. Use a walker or a cane if necessary and consider physical therapy for balance exercise. Get your eyesight checked regularly.   HOME SAFETY: Consider the safety of the kitchen when operating appliances like stoves, microwave oven, and blender. Consider having supervision and share cooking responsibilities until no longer able to participate in those. Accidents with firearms and other hazards in the house should be identified and addressed as well.   ABILITY TO BE LEFT ALONE: If patient is unable to contact 911 operator, consider using LifeLine, or when the need is there, arrange for someone to stay with patients. Smoking is a fire hazard, consider supervision or cessation. Risk of wandering should be assessed by caregiver and if detected at any point, supervision and safe proof recommendations should be instituted.  RECOMMENDATIONS FOR ALL PATIENTS WITH MEMORY PROBLEMS: 1. Continue to exercise (Recommend 30 minutes of walking everyday, or 3 hours every week) 2. Increase social interactions - continue going to [place] and enjoy social gatherings with friends and family 3. Eat healthy, avoid fried foods and eat more fruits and vegetables 4. Maintain adequate blood pressure, blood sugar, and blood cholesterol level.  Reducing the risk of stroke and cardiovascular disease also helps promoting better memory. 5. Avoid stressful situations. Live a simple life and avoid aggravations. Organize your time and prepare for the next day in anticipation. 6. Sleep well, avoid any interruptions of sleep and avoid any distractions in the bedroom that may interfere with adequate sleep quality 7. Avoid sugar, avoid sweets as there is a strong link between excessive sugar intake, diabetes, and cognitive impairment We discussed the Mediterranean diet, which has been shown to help patients reduce the risk of progressive memory disorders and reduces cardiovascular risk. This includes eating fish, eat fruits and green leafy vegetables, nuts like almonds and hazelnuts, walnuts, and also use olive oil. Avoid fast foods and fried foods as much as possible. Avoid sweets and sugar as sugar use has been linked to worsening of memory function.  There is always a concern of gradual progression of memory problems. If this is the case, then we may need to adjust level of care according to patient needs. Support, both to the patient and caregiver, should then be put into place.

## 2021-09-06 ENCOUNTER — Other Ambulatory Visit: Payer: Self-pay | Admitting: Family Medicine

## 2021-09-06 DIAGNOSIS — E2839 Other primary ovarian failure: Secondary | ICD-10-CM

## 2021-09-13 DIAGNOSIS — Z20822 Contact with and (suspected) exposure to covid-19: Secondary | ICD-10-CM | POA: Diagnosis not present

## 2021-09-16 ENCOUNTER — Other Ambulatory Visit: Payer: Medicare Other

## 2021-09-21 ENCOUNTER — Ambulatory Visit: Payer: Medicare Other

## 2021-10-11 DIAGNOSIS — Z20822 Contact with and (suspected) exposure to covid-19: Secondary | ICD-10-CM | POA: Diagnosis not present

## 2021-10-18 ENCOUNTER — Ambulatory Visit
Admission: RE | Admit: 2021-10-18 | Discharge: 2021-10-18 | Disposition: A | Discharge status: Home / Self Care | Payer: Medicare Other | Source: Ambulatory Visit | Attending: Family Medicine | Admitting: Family Medicine

## 2021-10-18 ENCOUNTER — Other Ambulatory Visit: Payer: Self-pay

## 2021-10-18 DIAGNOSIS — Z1231 Encounter for screening mammogram for malignant neoplasm of breast: Secondary | ICD-10-CM

## 2021-10-20 DIAGNOSIS — Z20822 Contact with and (suspected) exposure to covid-19: Secondary | ICD-10-CM | POA: Diagnosis not present

## 2021-10-27 ENCOUNTER — Telehealth: Payer: Self-pay | Admitting: *Deleted

## 2021-10-27 DIAGNOSIS — Z20822 Contact with and (suspected) exposure to covid-19: Secondary | ICD-10-CM | POA: Diagnosis not present

## 2021-10-27 NOTE — Telephone Encounter (Signed)
Spoke with the patient sister Mrs Loma Sousa and she stated the patient was last seen by Dr Buddy Duty endocrinologist at [place] in June 2022 and he stated the patient was doing so well, he would have her follow up with PCP for refills on thyroid medication and testing.  Message sent to PCP as she questioned if she was willing to take over treatment?

## 2021-10-28 NOTE — Telephone Encounter (Signed)
Spoke with Mrs Reece and informed her of the message below.  

## 2021-11-18 ENCOUNTER — Other Ambulatory Visit: Payer: Self-pay | Admitting: Neurology

## 2021-11-18 ENCOUNTER — Other Ambulatory Visit: Payer: Self-pay | Admitting: Family Medicine

## 2021-11-18 ENCOUNTER — Other Ambulatory Visit: Payer: Self-pay

## 2021-11-18 DIAGNOSIS — E876 Hypokalemia: Secondary | ICD-10-CM

## 2021-11-18 MED ORDER — EZETIMIBE 10 MG PO TABS: 10.0000 mg | ORAL_TABLET | Freq: Every day | ORAL | 0 refills | Status: DC

## 2021-11-21 ENCOUNTER — Other Ambulatory Visit: Payer: Self-pay

## 2021-11-21 ENCOUNTER — Ambulatory Visit (INDEPENDENT_AMBULATORY_CARE_PROVIDER_SITE_OTHER): Payer: Medicare Other | Admitting: Podiatry

## 2021-11-21 ENCOUNTER — Ambulatory Visit: Payer: Medicare Other | Admitting: Podiatry

## 2021-11-21 DIAGNOSIS — L989 Disorder of the skin and subcutaneous tissue, unspecified: Secondary | ICD-10-CM

## 2021-11-22 NOTE — Progress Notes (Signed)
Subjective: 86 y.o. female presenting to the office today as a new patient with her sister for evaluation of symptomatic calluses to the bilateral feet.  Patient states that they are very painful to walk on.  She does have a history of dementia and is somewhat of a poor historian however her sister states that she walks with an antalgic gait and there is significant pain during walking despite wearing good shoes.  They deny history of injury.  They have not done anything for treatment.  She presents for further treatment and evaluation  Past Medical History:  Diagnosis Date   Aortic atherosclerosis ([place])    a. noted on CT 01/2018.   Arthritis    Colonic polyp    Coronary atherosclerosis    a. noted on CT 01/2018.   Genital herpes    Hyperlipidemia    Hypertension    Mild carotid artery disease ([facility])    Osteoporosis    Overdose of cardiac medication    Pulmonary nodules    a. followed by PCP by serial Ct.   Thyroid disease    seeing Dr. Buddy Duty   Tremor    Past Surgical History:  Procedure Laterality Date   ABDOMINAL HYSTERECTOMY  1988   no cancer - reports total with bilat oophorectomy   APPENDECTOMY  1988   BREAST CYST EXCISION Bilateral    BREAST SURGERY  1985   biopsy   FOOT SURGERY     multiple sugeries, remote   TOTAL HIP ARTHROPLASTY Bilateral    Allergies  Allergen Reactions   Penicillins Swelling and Rash    ALL CILLINS   Tetracycline Itching   Atorvastatin Other (See Comments)    Leg cramps Leg cramps   Bactrim [Sulfamethoxazole-Trimethoprim] Itching   Celecoxib Other (See Comments)    Memory disturbance, slowed thinking Memory disturbance, slowed thinking   Niacin Other (See Comments)    Caused elevated BP   Niaspan [Niacin Er]     Elevated BP   Rosuvastatin Other (See Comments)    Elevated CPK   Simvastatin Other (See Comments)    Leg cramps Leg cramps   Tetracyclines & Related Itching     Objective:  Physical Exam General: Alert and oriented x3  in no acute distress  Dermatology: Hyperkeratotic lesion(s) present on the weightbearing surfaces of the bilateral feet. Pain on palpation with a central nucleated core noted. Skin is warm, dry and supple bilateral lower extremities. Negative for open lesions or macerations.  Vascular: Palpable pedal pulses bilaterally. No edema or erythema noted. Capillary refill within normal limits.  Neurological: Epicritic and protective threshold grossly intact bilaterally.   Musculoskeletal Exam: Pain on palpation at the keratotic lesion(s) noted.  Palpation of the forefoot also elicits pain.  Atrophy of the plantar fat pad also noted bilateral feet otherwise no pedal deformity noted  Assessment: 1.  Symptomatic calluses bilateral feet 2.  Fat pad atrophy   Plan of Care:  1. Patient evaluated 2. Excisional debridement of keratoic lesion(s) using a chisel blade was performed without incident.  3.  Recommend good supportive shoes and advised against going barefoot  4. Patient is to return to the clinic PRN.   Manus Gunning, sister, is Sharyn Lull Reese's mother-in-law  Edrick Kins, DPM [facility]  Dr. Edrick Kins, DPM    [address]  [place], [place] [address]                Office [phone]  Fax [phone]

## 2021-12-07 ENCOUNTER — Ambulatory Visit: Payer: Medicare Other | Admitting: Family Medicine

## 2021-12-12 DIAGNOSIS — Z20822 Contact with and (suspected) exposure to covid-19: Secondary | ICD-10-CM | POA: Diagnosis not present

## 2021-12-12 DIAGNOSIS — H6123 Impacted cerumen, bilateral: Secondary | ICD-10-CM | POA: Diagnosis not present

## 2021-12-12 DIAGNOSIS — Z23 Encounter for immunization: Secondary | ICD-10-CM | POA: Diagnosis not present

## 2021-12-14 DIAGNOSIS — D170 Benign lipomatous neoplasm of skin and subcutaneous tissue of head, face and neck: Secondary | ICD-10-CM | POA: Diagnosis not present

## 2021-12-14 DIAGNOSIS — Z961 Presence of intraocular lens: Secondary | ICD-10-CM | POA: Diagnosis not present

## 2021-12-14 DIAGNOSIS — H35363 Drusen (degenerative) of macula, bilateral: Secondary | ICD-10-CM | POA: Diagnosis not present

## 2021-12-14 DIAGNOSIS — E119 Type 2 diabetes mellitus without complications: Secondary | ICD-10-CM | POA: Diagnosis not present

## 2021-12-14 LAB — HM DIABETES EYE EXAM

## 2021-12-21 ENCOUNTER — Ambulatory Visit (INDEPENDENT_AMBULATORY_CARE_PROVIDER_SITE_OTHER): Payer: Medicare Other | Admitting: Family Medicine

## 2021-12-21 ENCOUNTER — Encounter: Payer: Self-pay | Admitting: Family Medicine

## 2021-12-21 VITALS — BP 84/58 | HR 72 | Temp 97.6°F | Ht 62.0 in | Wt 145.8 lb

## 2021-12-21 DIAGNOSIS — E559 Vitamin D deficiency, unspecified: Secondary | ICD-10-CM

## 2021-12-21 DIAGNOSIS — E038 Other specified hypothyroidism: Secondary | ICD-10-CM | POA: Diagnosis not present

## 2021-12-21 DIAGNOSIS — I10 Essential (primary) hypertension: Secondary | ICD-10-CM | POA: Diagnosis not present

## 2021-12-21 DIAGNOSIS — Z23 Encounter for immunization: Secondary | ICD-10-CM | POA: Diagnosis not present

## 2021-12-21 DIAGNOSIS — R059 Cough, unspecified: Secondary | ICD-10-CM | POA: Diagnosis not present

## 2021-12-21 DIAGNOSIS — E7800 Pure hypercholesterolemia, unspecified: Secondary | ICD-10-CM | POA: Diagnosis not present

## 2021-12-21 DIAGNOSIS — Z20828 Contact with and (suspected) exposure to other viral communicable diseases: Secondary | ICD-10-CM | POA: Diagnosis not present

## 2021-12-21 DIAGNOSIS — E538 Deficiency of other specified B group vitamins: Secondary | ICD-10-CM | POA: Diagnosis not present

## 2021-12-21 DIAGNOSIS — R051 Acute cough: Secondary | ICD-10-CM | POA: Diagnosis not present

## 2021-12-21 DIAGNOSIS — Z20822 Contact with and (suspected) exposure to covid-19: Secondary | ICD-10-CM | POA: Diagnosis not present

## 2021-12-21 DIAGNOSIS — E1165 Type 2 diabetes mellitus with hyperglycemia: Secondary | ICD-10-CM | POA: Diagnosis not present

## 2021-12-21 DIAGNOSIS — R413 Other amnesia: Secondary | ICD-10-CM

## 2021-12-21 NOTE — Progress Notes (Signed)
?[place] ?DOB: 18-Nov-1935 ?Encounter date: 12/21/2021 ? ?This is a 86 y.o. female who presents with ?Chief Complaint  ?Patient presents with  ? Follow-up  ? ? ?History of present illness: ?Last visit with me was 03/04/2021. ? ?Patient states that she is good. Sister states memory not as good.  ? ?Feet still bothering her. Had some callouses shaved last month. Still uncomfortable.  Left lateral foot is where it bothers her the most. ? ?Following with [facility] for shoulder pain.  ? ?Following regularly with dermatology, dentist. ? ?Saw eye doc recently. Had mammogram (normal 10/18/21). Has bone density scheduled next month.  ? ?Not exercising regularly. Just doesn't want to participate or doesn't remember. She does like the food at her living facility.  ? ?DMII: Metformin 500 mg daily.  A1c was 6.9 on 03/04/2021. ?HTN: Losartan-hydrochlorothiazide 100-12.5 mg daily. Not checking regularly.  ?Hypothyroid: Synthroid 50 mcg daily. ?HL: Zetia 10 mg daily, pravastatin 40 mg daily ?Mild dementia: following with neurology. Rivastigmine '3mg'$  BID. ? ?Allergies  ?Allergen Reactions  ? Penicillins Swelling and Rash  ?  ALL CILLINS  ? Tetracycline Itching  ? Atorvastatin Other (See Comments)  ?  Leg cramps ?Leg cramps  ? Bactrim [Sulfamethoxazole-Trimethoprim] Itching  ? Celecoxib Other (See Comments)  ?  Memory disturbance, slowed thinking ?Memory disturbance, slowed thinking  ? Niacin Other (See Comments)  ?  Caused elevated BP  ? Niaspan [Niacin Er]   ?  Elevated BP  ? Rosuvastatin Other (See Comments)  ?  Elevated CPK  ? Simvastatin Other (See Comments)  ?  Leg cramps ?Leg cramps  ? Tetracyclines & Related Itching  ? ?No outpatient medications have been marked as taking for the 12/21/21 encounter (Office Visit) with Caren Macadam, MD.  ? ? ?Review of Systems  ?Constitutional:  Negative for activity change, appetite change, chills, fatigue, fever and unexpected weight change.  ?HENT:  Negative for congestion, ear  pain, hearing loss, sinus pressure, sinus pain, sore throat and trouble swallowing.   ?Eyes:  Negative for pain and visual disturbance.  ?Respiratory:  Negative for cough, chest tightness, shortness of breath and wheezing.   ?Cardiovascular:  Negative for chest pain, palpitations and leg swelling.  ?Gastrointestinal:  Negative for abdominal pain, blood in stool, constipation, diarrhea, nausea and vomiting.  ?Genitourinary:  Negative for difficulty urinating and menstrual problem.  ?Musculoskeletal:  Negative for arthralgias and back pain.  ?Skin:  Negative for rash.  ?Neurological:  Negative for dizziness, weakness, numbness and headaches.  ?Hematological:  Negative for adenopathy. Does not bruise/bleed easily.  ?Psychiatric/Behavioral:  Negative for sleep disturbance and suicidal ideas. The patient is not nervous/anxious.   ? ?Objective: ? ?BP (!) 84/58 Comment: repeated by Mykal--jaf  Pulse 72   Temp 97.6 ?F (36.4 ?C) (Oral)   Ht '5\' 2"'$  (1.575 m)   Wt 145 lb 12.8 oz (66.1 kg)   SpO2 99%   BMI 26.67 kg/m?   Weight: 145 lb 12.8 oz (66.1 kg)  ? ?BP Readings from Last 3 Encounters:  ?12/21/21 (!) 84/58  ?08/29/21 127/80  ?03/04/21 108/62  ? ?Wt Readings from Last 3 Encounters:  ?12/21/21 145 lb 12.8 oz (66.1 kg)  ?08/29/21 158 lb (71.7 kg)  ?03/04/21 167 lb 8 oz (76 kg)  ? ? ?Physical Exam ?Constitutional:   ?   General: She is not in acute distress. ?   Appearance: She is well-developed.  ?HENT:  ?   Head: Normocephalic and atraumatic.  ?   Right  Ear: External ear normal.  ?   Left Ear: External ear normal.  ?   Mouth/Throat:  ?   Pharynx: No oropharyngeal exudate.  ?Eyes:  ?   Conjunctiva/sclera: Conjunctivae normal.  ?   Pupils: Pupils are equal, round, and reactive to light.  ?Neck:  ?   Thyroid: No thyromegaly.  ?Cardiovascular:  ?   Rate and Rhythm: Normal rate and regular rhythm.  ?   Heart sounds: Normal heart sounds. No murmur heard. ?  No friction rub. No gallop.  ?Pulmonary:  ?   Effort: Pulmonary  effort is normal.  ?   Breath sounds: Normal breath sounds.  ?Abdominal:  ?   General: Bowel sounds are normal. There is no distension.  ?   Palpations: Abdomen is soft. There is no mass.  ?   Tenderness: There is no abdominal tenderness. There is no guarding.  ?   Hernia: No hernia is present.  ?Musculoskeletal:     ?   General: No tenderness or deformity. Normal range of motion.  ?   Cervical back: Normal range of motion and neck supple.  ?Feet:  ?   Comments: Normal diabetic foot exam. ?Lymphadenopathy:  ?   Cervical: No cervical adenopathy.  ?Skin: ?   General: Skin is warm and dry.  ?   Findings: No rash.  ?Neurological:  ?   Mental Status: She is alert and oriented to person, place, and time.  ?   Motor: No weakness.  ?   Gait: Gait is intact.  ?   Deep Tendon Reflexes: Reflexes normal.  ?   Reflex Scores: ?     Tricep reflexes are 2+ on the right side and 2+ on the left side. ?     Bicep reflexes are 2+ on the right side and 2+ on the left side. ?     Brachioradialis reflexes are 2+ on the right side and 2+ on the left side. ?     Patellar reflexes are 2+ on the right side and 2+ on the left side. ?Psychiatric:     ?   Speech: Speech normal.     ?   Behavior: Behavior is slowed.     ?   Thought Content: Thought content normal.     ?   Cognition and Memory: Memory is impaired. She exhibits impaired recent memory.  ?   Comments: Repeats same questions during her visit, but very relaxed, pleasant, happy.  ? ? ?Assessment/Plan ? ?1. Benign essential hypertension ?Blood pressure is on lower end of normal today.  I have written a letter for her living facility to check her blood pressure a few times a week so we can get some better idea of numbers.  We may be able to back off of blood pressure medication.  I would consider dropping the hydrochlorothiazide component of her Hyzaar if she is running low. ?- CBC with Differential/Platelet; Future ?- Comprehensive metabolic panel; Future ?- Comprehensive metabolic  panel ?- CBC with Differential/Platelet ? ?2. Other specified hypothyroidism ?Continue with Synthroid 50 mcg daily.  Recheck blood work today. ?- TSH; Future ?- TSH ? ?3. Hypercholesterolemia ?Continue pravastatin 40 mg daily.  Recheck blood work today. ?- Lipid panel; Future ?- Lipid panel ? ?4. Vitamin D deficiency ?History of deficiency.  She supplements 1000 units 3 times a week. ? ?5. Memory loss ?This is progressing.  She does follow with neurology.  Really on rivastigmine. ? ?6. B12 deficiency ?Recheck baseline levels.  History  of deficiency.  Currently taking multivitamin. ? ?7. Controlled type 2 diabetes mellitus with hyperglycemia, without long-term current use of insulin ([place]) ?She is on metformin 500 mg daily extended release.  Recheck blood work. ?- Hemoglobin A1c; Future ?- Microalbumin / creatinine urine ratio; Future ?- HM DIABETES FOOT EXAM ?- Microalbumin / creatinine urine ratio ?- Hemoglobin A1c ? ?8. Need for pneumococcal vaccination ?- Pneumococcal conjugate vaccine 20-valent (Prevnar 20) ? ?Return for pending lab or imaging results. ?Sister helps to manage her medical needs.  She is up-to-date with all preventative care visits. ? ?42 minutes spent in chart review, time with patient, exam, charting, follow-up plan. ? ? ?Micheline Rough, MD ?

## 2021-12-22 ENCOUNTER — Telehealth: Payer: Self-pay | Admitting: Family Medicine

## 2021-12-22 ENCOUNTER — Telehealth: Payer: Self-pay | Admitting: *Deleted

## 2021-12-22 ENCOUNTER — Encounter: Payer: Self-pay | Admitting: Family Medicine

## 2021-12-22 LAB — COMPREHENSIVE METABOLIC PANEL
ALT: 16 U/L (ref 0–35)
AST: 21 U/L (ref 0–37)
Albumin: 4.3 g/dL (ref 3.5–5.2)
Alkaline Phosphatase: 59 U/L (ref 39–117)
BUN: 19 mg/dL (ref 6–23)
CO2: 23 mEq/L (ref 19–32)
Calcium: 10.1 mg/dL (ref 8.4–10.5)
Chloride: 103 mEq/L (ref 96–112)
Creatinine, Ser: 0.88 mg/dL (ref 0.40–1.20)
GFR: 60.57 mL/min (ref >60.00)
Glucose, Bld: 113 mg/dL — ABNORMAL HIGH (ref 70–99)
Potassium: 4 mEq/L (ref 3.5–5.1)
Sodium: 139 mEq/L (ref 135–145)
Total Bilirubin: 0.4 mg/dL (ref 0.2–1.2)
Total Protein: 7.3 g/dL (ref 6.0–8.3)

## 2021-12-22 LAB — LIPID PANEL
Cholesterol: 137 mg/dL (ref 0–200)
HDL: 48.9 mg/dL (ref >39.00)
LDL Cholesterol: 59 mg/dL (ref 0–99)
NonHDL: 87.61
Total CHOL/HDL Ratio: 3
Triglycerides: 142 mg/dL (ref 0.0–149.0)
VLDL: 28.4 mg/dL (ref 0.0–40.0)

## 2021-12-22 LAB — CBC WITH DIFFERENTIAL/PLATELET
Basophils Absolute: 0.1 10*3/uL (ref 0.0–0.1)
Basophils Relative: 1.3 % (ref 0.0–3.0)
Eosinophils Absolute: 0.1 10*3/uL (ref 0.0–0.7)
Eosinophils Relative: 1.8 % (ref 0.0–5.0)
HCT: 41.1 % (ref 36.0–46.0)
Hemoglobin: 13.7 g/dL (ref 12.0–15.0)
Lymphocytes Relative: 31.8 % (ref 12.0–46.0)
Lymphs Abs: 2.5 10*3/uL (ref 0.7–4.0)
MCHC: 33.4 g/dL (ref 30.0–36.0)
MCV: 87.9 fl (ref 78.0–100.0)
Monocytes Absolute: 0.5 10*3/uL (ref 0.1–1.0)
Monocytes Relative: 6.2 % (ref 3.0–12.0)
Neutro Abs: 4.7 10*3/uL (ref 1.4–7.7)
Neutrophils Relative %: 58.9 % (ref 43.0–77.0)
Platelets: 340 10*3/uL (ref 150.0–400.0)
RBC: 4.68 Mil/uL (ref 3.87–5.11)
RDW: 14.6 % (ref 11.5–15.5)
WBC: 7.9 10*3/uL (ref 4.0–10.5)

## 2021-12-22 LAB — MICROALBUMIN / CREATININE URINE RATIO
Creatinine,U: 166.7 mg/dL
Microalb Creat Ratio: 2.1 mg/g (ref 0.0–30.0)
Microalb, Ur: 3.4 mg/dL — ABNORMAL HIGH (ref 0.0–1.9)

## 2021-12-22 LAB — HEMOGLOBIN A1C: Hgb A1c MFr Bld: 6.4 % (ref 4.6–6.5)

## 2021-12-22 LAB — TSH: TSH: 0.89 u[IU]/mL (ref 0.35–5.50)

## 2021-12-22 NOTE — Telephone Encounter (Signed)
Mrs Felicia Kelly called back and is aware the letter will be left at the front desk for pick up. ?

## 2021-12-22 NOTE — Telephone Encounter (Signed)
Noted  

## 2021-12-22 NOTE — Telephone Encounter (Signed)
I left a detailed message on Mrs Reece's voicemail (patient's sister) stating the letter was printed by Dr Ethlyn Gallery, this was not given during the visit and questioned if she wanted this left at the front desk or mailed to her? ?

## 2021-12-22 NOTE — Telephone Encounter (Signed)
Felicia Kelly with [facility] is calling to let md know they will be checking pt BP 3x a week  ?

## 2021-12-25 DIAGNOSIS — Z20828 Contact with and (suspected) exposure to other viral communicable diseases: Secondary | ICD-10-CM | POA: Diagnosis not present

## 2021-12-27 DIAGNOSIS — R2689 Other abnormalities of gait and mobility: Secondary | ICD-10-CM | POA: Diagnosis not present

## 2021-12-27 DIAGNOSIS — M6281 Muscle weakness (generalized): Secondary | ICD-10-CM | POA: Diagnosis not present

## 2021-12-28 DIAGNOSIS — R2681 Unsteadiness on feet: Secondary | ICD-10-CM | POA: Diagnosis not present

## 2021-12-28 DIAGNOSIS — R41841 Cognitive communication deficit: Secondary | ICD-10-CM | POA: Diagnosis not present

## 2021-12-28 DIAGNOSIS — M6281 Muscle weakness (generalized): Secondary | ICD-10-CM | POA: Diagnosis not present

## 2021-12-28 DIAGNOSIS — R2689 Other abnormalities of gait and mobility: Secondary | ICD-10-CM | POA: Diagnosis not present

## 2021-12-28 DIAGNOSIS — R488 Other symbolic dysfunctions: Secondary | ICD-10-CM | POA: Diagnosis not present

## 2021-12-29 DIAGNOSIS — R488 Other symbolic dysfunctions: Secondary | ICD-10-CM | POA: Diagnosis not present

## 2021-12-29 DIAGNOSIS — M25511 Pain in right shoulder: Secondary | ICD-10-CM | POA: Diagnosis not present

## 2021-12-29 DIAGNOSIS — R41841 Cognitive communication deficit: Secondary | ICD-10-CM | POA: Diagnosis not present

## 2021-12-29 DIAGNOSIS — R2689 Other abnormalities of gait and mobility: Secondary | ICD-10-CM | POA: Diagnosis not present

## 2021-12-29 DIAGNOSIS — M6281 Muscle weakness (generalized): Secondary | ICD-10-CM | POA: Diagnosis not present

## 2021-12-29 DIAGNOSIS — R2681 Unsteadiness on feet: Secondary | ICD-10-CM | POA: Diagnosis not present

## 2021-12-30 DIAGNOSIS — R41841 Cognitive communication deficit: Secondary | ICD-10-CM | POA: Diagnosis not present

## 2021-12-30 DIAGNOSIS — M6281 Muscle weakness (generalized): Secondary | ICD-10-CM | POA: Diagnosis not present

## 2021-12-30 DIAGNOSIS — R2689 Other abnormalities of gait and mobility: Secondary | ICD-10-CM | POA: Diagnosis not present

## 2021-12-30 DIAGNOSIS — R488 Other symbolic dysfunctions: Secondary | ICD-10-CM | POA: Diagnosis not present

## 2021-12-30 DIAGNOSIS — R2681 Unsteadiness on feet: Secondary | ICD-10-CM | POA: Diagnosis not present

## 2022-01-02 DIAGNOSIS — R488 Other symbolic dysfunctions: Secondary | ICD-10-CM | POA: Diagnosis not present

## 2022-01-02 DIAGNOSIS — M6281 Muscle weakness (generalized): Secondary | ICD-10-CM | POA: Diagnosis not present

## 2022-01-02 DIAGNOSIS — R41841 Cognitive communication deficit: Secondary | ICD-10-CM | POA: Diagnosis not present

## 2022-01-02 DIAGNOSIS — R2681 Unsteadiness on feet: Secondary | ICD-10-CM | POA: Diagnosis not present

## 2022-01-02 DIAGNOSIS — R2689 Other abnormalities of gait and mobility: Secondary | ICD-10-CM | POA: Diagnosis not present

## 2022-01-03 DIAGNOSIS — R2681 Unsteadiness on feet: Secondary | ICD-10-CM | POA: Diagnosis not present

## 2022-01-03 DIAGNOSIS — M6281 Muscle weakness (generalized): Secondary | ICD-10-CM | POA: Diagnosis not present

## 2022-01-03 DIAGNOSIS — R41841 Cognitive communication deficit: Secondary | ICD-10-CM | POA: Diagnosis not present

## 2022-01-03 DIAGNOSIS — R2689 Other abnormalities of gait and mobility: Secondary | ICD-10-CM | POA: Diagnosis not present

## 2022-01-03 DIAGNOSIS — R488 Other symbolic dysfunctions: Secondary | ICD-10-CM | POA: Diagnosis not present

## 2022-01-04 DIAGNOSIS — R488 Other symbolic dysfunctions: Secondary | ICD-10-CM | POA: Diagnosis not present

## 2022-01-04 DIAGNOSIS — M6281 Muscle weakness (generalized): Secondary | ICD-10-CM | POA: Diagnosis not present

## 2022-01-04 DIAGNOSIS — R2681 Unsteadiness on feet: Secondary | ICD-10-CM | POA: Diagnosis not present

## 2022-01-04 DIAGNOSIS — R41841 Cognitive communication deficit: Secondary | ICD-10-CM | POA: Diagnosis not present

## 2022-01-04 DIAGNOSIS — R2689 Other abnormalities of gait and mobility: Secondary | ICD-10-CM | POA: Diagnosis not present

## 2022-01-05 DIAGNOSIS — M6281 Muscle weakness (generalized): Secondary | ICD-10-CM | POA: Diagnosis not present

## 2022-01-05 DIAGNOSIS — R488 Other symbolic dysfunctions: Secondary | ICD-10-CM | POA: Diagnosis not present

## 2022-01-05 DIAGNOSIS — R2689 Other abnormalities of gait and mobility: Secondary | ICD-10-CM | POA: Diagnosis not present

## 2022-01-05 DIAGNOSIS — R2681 Unsteadiness on feet: Secondary | ICD-10-CM | POA: Diagnosis not present

## 2022-01-05 DIAGNOSIS — R41841 Cognitive communication deficit: Secondary | ICD-10-CM | POA: Diagnosis not present

## 2022-01-08 DIAGNOSIS — Z23 Encounter for immunization: Secondary | ICD-10-CM | POA: Diagnosis not present

## 2022-01-09 DIAGNOSIS — M6281 Muscle weakness (generalized): Secondary | ICD-10-CM | POA: Diagnosis not present

## 2022-01-09 DIAGNOSIS — R2681 Unsteadiness on feet: Secondary | ICD-10-CM | POA: Diagnosis not present

## 2022-01-09 DIAGNOSIS — R41841 Cognitive communication deficit: Secondary | ICD-10-CM | POA: Diagnosis not present

## 2022-01-09 DIAGNOSIS — R488 Other symbolic dysfunctions: Secondary | ICD-10-CM | POA: Diagnosis not present

## 2022-01-09 DIAGNOSIS — R2689 Other abnormalities of gait and mobility: Secondary | ICD-10-CM | POA: Diagnosis not present

## 2022-01-10 DIAGNOSIS — M6281 Muscle weakness (generalized): Secondary | ICD-10-CM | POA: Diagnosis not present

## 2022-01-10 DIAGNOSIS — R2689 Other abnormalities of gait and mobility: Secondary | ICD-10-CM | POA: Diagnosis not present

## 2022-01-10 DIAGNOSIS — R2681 Unsteadiness on feet: Secondary | ICD-10-CM | POA: Diagnosis not present

## 2022-01-10 DIAGNOSIS — R41841 Cognitive communication deficit: Secondary | ICD-10-CM | POA: Diagnosis not present

## 2022-01-10 DIAGNOSIS — R488 Other symbolic dysfunctions: Secondary | ICD-10-CM | POA: Diagnosis not present

## 2022-01-11 DIAGNOSIS — Z20822 Contact with and (suspected) exposure to covid-19: Secondary | ICD-10-CM | POA: Diagnosis not present

## 2022-01-11 DIAGNOSIS — R2681 Unsteadiness on feet: Secondary | ICD-10-CM | POA: Diagnosis not present

## 2022-01-11 DIAGNOSIS — R488 Other symbolic dysfunctions: Secondary | ICD-10-CM | POA: Diagnosis not present

## 2022-01-11 DIAGNOSIS — R2689 Other abnormalities of gait and mobility: Secondary | ICD-10-CM | POA: Diagnosis not present

## 2022-01-11 DIAGNOSIS — M6281 Muscle weakness (generalized): Secondary | ICD-10-CM | POA: Diagnosis not present

## 2022-01-11 DIAGNOSIS — R41841 Cognitive communication deficit: Secondary | ICD-10-CM | POA: Diagnosis not present

## 2022-01-13 DIAGNOSIS — R41841 Cognitive communication deficit: Secondary | ICD-10-CM | POA: Diagnosis not present

## 2022-01-13 DIAGNOSIS — R488 Other symbolic dysfunctions: Secondary | ICD-10-CM | POA: Diagnosis not present

## 2022-01-13 DIAGNOSIS — R2689 Other abnormalities of gait and mobility: Secondary | ICD-10-CM | POA: Diagnosis not present

## 2022-01-13 DIAGNOSIS — R2681 Unsteadiness on feet: Secondary | ICD-10-CM | POA: Diagnosis not present

## 2022-01-13 DIAGNOSIS — M6281 Muscle weakness (generalized): Secondary | ICD-10-CM | POA: Diagnosis not present

## 2022-01-14 DIAGNOSIS — Z20822 Contact with and (suspected) exposure to covid-19: Secondary | ICD-10-CM | POA: Diagnosis not present

## 2022-01-16 DIAGNOSIS — R2681 Unsteadiness on feet: Secondary | ICD-10-CM | POA: Diagnosis not present

## 2022-01-16 DIAGNOSIS — M6281 Muscle weakness (generalized): Secondary | ICD-10-CM | POA: Diagnosis not present

## 2022-01-16 DIAGNOSIS — R2689 Other abnormalities of gait and mobility: Secondary | ICD-10-CM | POA: Diagnosis not present

## 2022-01-16 DIAGNOSIS — R488 Other symbolic dysfunctions: Secondary | ICD-10-CM | POA: Diagnosis not present

## 2022-01-16 DIAGNOSIS — R41841 Cognitive communication deficit: Secondary | ICD-10-CM | POA: Diagnosis not present

## 2022-01-17 DIAGNOSIS — R41841 Cognitive communication deficit: Secondary | ICD-10-CM | POA: Diagnosis not present

## 2022-01-17 DIAGNOSIS — R2689 Other abnormalities of gait and mobility: Secondary | ICD-10-CM | POA: Diagnosis not present

## 2022-01-17 DIAGNOSIS — Z20822 Contact with and (suspected) exposure to covid-19: Secondary | ICD-10-CM | POA: Diagnosis not present

## 2022-01-17 DIAGNOSIS — R488 Other symbolic dysfunctions: Secondary | ICD-10-CM | POA: Diagnosis not present

## 2022-01-17 DIAGNOSIS — M6281 Muscle weakness (generalized): Secondary | ICD-10-CM | POA: Diagnosis not present

## 2022-01-17 DIAGNOSIS — R2681 Unsteadiness on feet: Secondary | ICD-10-CM | POA: Diagnosis not present

## 2022-01-18 DIAGNOSIS — R488 Other symbolic dysfunctions: Secondary | ICD-10-CM | POA: Diagnosis not present

## 2022-01-18 DIAGNOSIS — R2689 Other abnormalities of gait and mobility: Secondary | ICD-10-CM | POA: Diagnosis not present

## 2022-01-18 DIAGNOSIS — R41841 Cognitive communication deficit: Secondary | ICD-10-CM | POA: Diagnosis not present

## 2022-01-18 DIAGNOSIS — M6281 Muscle weakness (generalized): Secondary | ICD-10-CM | POA: Diagnosis not present

## 2022-01-18 DIAGNOSIS — R2681 Unsteadiness on feet: Secondary | ICD-10-CM | POA: Diagnosis not present

## 2022-01-19 DIAGNOSIS — R488 Other symbolic dysfunctions: Secondary | ICD-10-CM | POA: Diagnosis not present

## 2022-01-19 DIAGNOSIS — R2681 Unsteadiness on feet: Secondary | ICD-10-CM | POA: Diagnosis not present

## 2022-01-19 DIAGNOSIS — R41841 Cognitive communication deficit: Secondary | ICD-10-CM | POA: Diagnosis not present

## 2022-01-19 DIAGNOSIS — R2689 Other abnormalities of gait and mobility: Secondary | ICD-10-CM | POA: Diagnosis not present

## 2022-01-19 DIAGNOSIS — M6281 Muscle weakness (generalized): Secondary | ICD-10-CM | POA: Diagnosis not present

## 2022-01-19 DIAGNOSIS — Z20822 Contact with and (suspected) exposure to covid-19: Secondary | ICD-10-CM | POA: Diagnosis not present

## 2022-01-20 DIAGNOSIS — M6281 Muscle weakness (generalized): Secondary | ICD-10-CM | POA: Diagnosis not present

## 2022-01-20 DIAGNOSIS — R2681 Unsteadiness on feet: Secondary | ICD-10-CM | POA: Diagnosis not present

## 2022-01-20 DIAGNOSIS — R488 Other symbolic dysfunctions: Secondary | ICD-10-CM | POA: Diagnosis not present

## 2022-01-20 DIAGNOSIS — R2689 Other abnormalities of gait and mobility: Secondary | ICD-10-CM | POA: Diagnosis not present

## 2022-01-20 DIAGNOSIS — R41841 Cognitive communication deficit: Secondary | ICD-10-CM | POA: Diagnosis not present

## 2022-01-23 ENCOUNTER — Telehealth: Payer: Self-pay | Admitting: Cardiology

## 2022-01-23 DIAGNOSIS — R2689 Other abnormalities of gait and mobility: Secondary | ICD-10-CM | POA: Diagnosis not present

## 2022-01-23 DIAGNOSIS — M6281 Muscle weakness (generalized): Secondary | ICD-10-CM | POA: Diagnosis not present

## 2022-01-23 DIAGNOSIS — R488 Other symbolic dysfunctions: Secondary | ICD-10-CM | POA: Diagnosis not present

## 2022-01-23 DIAGNOSIS — R2681 Unsteadiness on feet: Secondary | ICD-10-CM | POA: Diagnosis not present

## 2022-01-23 DIAGNOSIS — R41841 Cognitive communication deficit: Secondary | ICD-10-CM | POA: Diagnosis not present

## 2022-01-23 MED ORDER — EZETIMIBE 10 MG PO TABS: 10.0000 mg | ORAL_TABLET | Freq: Every day | ORAL | 0 refills | Status: DC

## 2022-01-23 NOTE — Telephone Encounter (Signed)
Pt's medication was sent to pt's pharmacy as requested. Confirmation received.  °

## 2022-01-23 NOTE — Telephone Encounter (Signed)
?*  STAT* If patient is at the pharmacy, call can be transferred to refill team. ? ? ?1. Which medications need to be refilled? (please list name of each medication and dose if known)  ?ezetimibe (ZETIA) 10 MG tablet ? ?2. Which pharmacy/location (including street and city if local pharmacy) is medication to be sent to? ?[facility] [place]. Do they need a 30 day or 90 day supply?  ? ?Patient is completely out of medication. Patient's sister is requesting a 2 week supply to last the patient until 5/01 appointment with Dr. Johney Frame. ? ?

## 2022-01-24 DIAGNOSIS — M6281 Muscle weakness (generalized): Secondary | ICD-10-CM | POA: Diagnosis not present

## 2022-01-24 DIAGNOSIS — R41841 Cognitive communication deficit: Secondary | ICD-10-CM | POA: Diagnosis not present

## 2022-01-24 DIAGNOSIS — R2681 Unsteadiness on feet: Secondary | ICD-10-CM | POA: Diagnosis not present

## 2022-01-24 DIAGNOSIS — R2689 Other abnormalities of gait and mobility: Secondary | ICD-10-CM | POA: Diagnosis not present

## 2022-01-24 DIAGNOSIS — R488 Other symbolic dysfunctions: Secondary | ICD-10-CM | POA: Diagnosis not present

## 2022-01-25 ENCOUNTER — Encounter: Payer: Self-pay | Admitting: Family

## 2022-01-25 ENCOUNTER — Ambulatory Visit (INDEPENDENT_AMBULATORY_CARE_PROVIDER_SITE_OTHER): Payer: Medicare Other | Admitting: Family

## 2022-01-25 VITALS — BP 110/60 | HR 65 | Ht 62.0 in | Wt 145.0 lb

## 2022-01-25 DIAGNOSIS — R350 Frequency of micturition: Secondary | ICD-10-CM | POA: Diagnosis not present

## 2022-01-25 DIAGNOSIS — N3090 Cystitis, unspecified without hematuria: Secondary | ICD-10-CM

## 2022-01-25 DIAGNOSIS — R41841 Cognitive communication deficit: Secondary | ICD-10-CM | POA: Diagnosis not present

## 2022-01-25 DIAGNOSIS — M6281 Muscle weakness (generalized): Secondary | ICD-10-CM | POA: Diagnosis not present

## 2022-01-25 DIAGNOSIS — R488 Other symbolic dysfunctions: Secondary | ICD-10-CM | POA: Diagnosis not present

## 2022-01-25 DIAGNOSIS — R2681 Unsteadiness on feet: Secondary | ICD-10-CM | POA: Diagnosis not present

## 2022-01-25 DIAGNOSIS — R2689 Other abnormalities of gait and mobility: Secondary | ICD-10-CM | POA: Diagnosis not present

## 2022-01-25 LAB — POCT URINALYSIS DIPSTICK
Bilirubin, UA: NEGATIVE
Glucose, UA: NEGATIVE
Ketones, UA: NEGATIVE
Nitrite, UA: NEGATIVE
Protein, UA: NEGATIVE
Spec Grav, UA: 1.015 (ref 1.010–1.025)
Urobilinogen, UA: 0.2 E.U./dL
pH, UA: 6 (ref 5.0–8.0)

## 2022-01-25 MED ORDER — NITROFURANTOIN MONOHYD MACRO 100 MG PO CAPS: 100.0000 mg | ORAL_CAPSULE | Freq: Two times a day (BID) | ORAL | 0 refills | Status: DC

## 2022-01-25 NOTE — Patient Instructions (Signed)
It was very nice to see you today! ? ?I have sent over an antibiotic for your urinary infection, Macrobid. Take this twice a day for 5 days. ?I also am sending the urine out for culture and we let you know the results. ?Drink at least 2 liters of water so your urine is a light yellow. ?Let us know if you are not feeling better. ? ? ? ? ? ? ?PLEASE NOTE: ? ?If you had any lab tests please let us know if you have not heard back within a few days. You may see your results on MyChart before we have a chance to review them but we will give you a call once they are reviewed by [place]. If we ordered any referrals today, please let us know if you have not heard from their office within the next week.  ? ?

## 2022-01-25 NOTE — Progress Notes (Signed)
? ?Subjective:  ? ? ? Patient ID: Felicia Kelly, female    DOB: Feb 11, 1936, 86 y.o.   MRN: [id number] ? ?Chief Complaint  ?Patient presents with  ? Dysuria  ?  Pt c/o dysuria since earlier today.   ? ?HPI: ?Urinary symptoms: Patient c/o  dysuria, and confusion [facility]. Other sx: vaginal d/c no, vaginal itching no. Duration of sx: 1 day; Home tx: none; Denies  nausea, fever. Reports last UTI more than a year ago.  ? ?There are no preventive care reminders to display for this patient. ? ?Past Medical History:  ?Diagnosis Date  ? Aortic atherosclerosis ([place])   ? a. noted on CT 01/2018.  ? Arthritis   ? Colonic polyp   ? Coronary atherosclerosis   ? a. noted on CT 01/2018.  ? Genital herpes   ? Hyperlipidemia   ? Hypertension   ? Mild carotid artery disease ([place])   ? Osteoporosis   ? Overdose of cardiac medication   ? Pulmonary nodules   ? a. followed by PCP by serial Ct.  ? Thyroid disease   ? seeing Dr. Buddy Duty  ? Tremor   ? ? ?Past Surgical History:  ?Procedure Laterality Date  ? ABDOMINAL HYSTERECTOMY  1988  ? no cancer - reports total with bilat oophorectomy  ? APPENDECTOMY  1988  ? BREAST CYST EXCISION Bilateral   ? BREAST SURGERY  1985  ? biopsy  ? FOOT SURGERY    ? multiple sugeries, remote  ? TOTAL HIP ARTHROPLASTY Bilateral   ? ? ?Outpatient Medications Prior to Visit  ?Medication Sig Dispense Refill  ? aspirin EC 81 MG tablet Take 81 mg by mouth every evening. Swallow whole.    ? Cholecalciferol (VITAMIN D3 PO) Take 1,000 Units by mouth 3 (three) times a week.    ? cyanocobalamin 100 MCG tablet Take by mouth.    ? ezetimibe (ZETIA) 10 MG tablet Take 1 tablet (10 mg total) by mouth daily. 30 tablet 0  ? levothyroxine (SYNTHROID) 50 MCG tablet TAKE 1 TABLET ONCE DAILY BEFORE BREAKFAST. 90 tablet 1  ? losartan-hydrochlorothiazide (HYZAAR) 100-12.5 MG tablet TAKE ONE TABLET BY MOUTH ONCE DAILY **NEED OFFICE VISIT** 90 tablet 1  ? Magnesium 250 MG TABS Take by mouth.    ? MELATONIN PO Take by  mouth every evening.    ? METAMUCIL FIBER PO Take by mouth every evening.    ? metFORMIN (GLUCOPHAGE-XR) 500 MG 24 hr tablet TAKE ONE TABLET BY MOUTH DAILY WITH BREAKFAST **NEED OFFICE VISIT** 90 tablet 1  ? Multiple Vitamin (MULTIVITAMIN) tablet Take 1 tablet by mouth daily.    ? OVER THE COUNTER MEDICATION Occuvite-every morning    ? potassium chloride (KLOR-CON) 10 MEQ tablet TAKE 1 TABLET THREE TIMES WEEKLY. 36 tablet 2  ? pravastatin (PRAVACHOL) 40 MG tablet TAKE 1 TABLET ONCE DAILY. 90 tablet 3  ? Probiotic Product (PROBIOTIC PO) Take by mouth every evening.    ? rivastigmine (EXELON) 3 MG capsule TAKE ONE CAPSULE BY MOUTH TWICE DAILY. 180 capsule 0  ? valACYclovir (VALTREX) 500 MG tablet TAKE 1 TABLET ONCE DAILY. 90 tablet 1  ? levothyroxine (SYNTHROID) 50 MCG tablet 1 tablet six days a week, 2 tablets on sundays    ? ?No facility-administered medications prior to visit.  ? ? ?Allergies  ?Allergen Reactions  ? Penicillins Swelling and Rash  ?  ALL CILLINS  ? Tetracycline Itching  ? Atorvastatin Other (See Comments)  ?  Leg cramps ?Leg cramps  ? Bactrim [Sulfamethoxazole-Trimethoprim] Itching  ? Celecoxib Other (See Comments)  ?  Memory disturbance, slowed thinking ?Memory disturbance, slowed thinking  ? Niacin Other (See Comments)  ?  Caused elevated BP  ? Niaspan [Niacin Er]   ?  Elevated BP  ? Rosuvastatin Other (See Comments)  ?  Elevated CPK  ? Simvastatin Other (See Comments)  ?  Leg cramps ?Leg cramps  ? Tetracyclines & Related Itching  ? ?   ?Objective:  ?  ?Physical Exam ?Vitals and nursing note reviewed.  ?Constitutional:   ?   Appearance: Normal appearance.  ?Cardiovascular:  ?   Rate and Rhythm: Normal rate and regular rhythm.  ?Pulmonary:  ?   Effort: Pulmonary effort is normal.  ?   Breath sounds: Normal breath sounds.  ?Musculoskeletal:     ?   General: Normal range of motion.  ?Skin: ?   General: Skin is warm and dry.  ?Neurological:  ?   Mental Status: She is alert.  ?Psychiatric:     ?   Mood  and Affect: Mood normal.     ?   Behavior: Behavior normal.  ? ? ?BP 110/60 (BP Location: Left Arm, Patient Position: Sitting, Cuff Size: Normal)   Pulse 65   Ht '5\' 2"'$  (1.575 m)   Wt 145 lb (65.8 kg)   SpO2 91%   BMI 26.52 kg/m?  ?Wt Readings from Last 3 Encounters:  ?01/25/22 145 lb (65.8 kg)  ?12/21/21 145 lb 12.8 oz (66.1 kg)  ?08/29/21 158 lb (71.7 kg)  ? ?   ?Assessment & Plan:  ? ?Problem List Items Addressed This Visit   ?None ?Visit Diagnoses   ? ? Urinary frequency    -  Primary  ? Relevant Orders  ? POCT Urinalysis Dipstick (Completed)  ? Cystitis      ? mild; sending urine for culture, [facility] in [facility], here with her sister. Sending Macrobid, advised on use & SE, increase water intake to 2 liters qd. ? ?Relevant Medications  ? nitrofurantoin, macrocrystal-monohydrate, (MACROBID) 100 MG capsule  ? Other Relevant Orders  ? Urine Culture  ? ?  ? ? ?Meds ordered this encounter  ?Medications  ? nitrofurantoin, macrocrystal-monohydrate, (MACROBID) 100 MG capsule  ?  Sig: Take 1 capsule (100 mg total) by mouth 2 (two) times daily.  ?  Dispense:  14 capsule  ?  Refill:  0  ?  Order Specific Question:   Supervising Provider  ?  Answer:   ANDY, CAMILLE L [[id number]]  ? ? ?Jeanie Sewer, NP ? ?

## 2022-01-26 DIAGNOSIS — R488 Other symbolic dysfunctions: Secondary | ICD-10-CM | POA: Diagnosis not present

## 2022-01-26 DIAGNOSIS — R41841 Cognitive communication deficit: Secondary | ICD-10-CM | POA: Diagnosis not present

## 2022-01-26 DIAGNOSIS — R2681 Unsteadiness on feet: Secondary | ICD-10-CM | POA: Diagnosis not present

## 2022-01-26 DIAGNOSIS — M6281 Muscle weakness (generalized): Secondary | ICD-10-CM | POA: Diagnosis not present

## 2022-01-26 DIAGNOSIS — R2689 Other abnormalities of gait and mobility: Secondary | ICD-10-CM | POA: Diagnosis not present

## 2022-01-26 LAB — URINE CULTURE
MICRO NUMBER:: 13254177
Result:: NO GROWTH
SPECIMEN QUALITY:: ADEQUATE

## 2022-01-27 DIAGNOSIS — R2681 Unsteadiness on feet: Secondary | ICD-10-CM | POA: Diagnosis not present

## 2022-01-27 DIAGNOSIS — R41841 Cognitive communication deficit: Secondary | ICD-10-CM | POA: Diagnosis not present

## 2022-01-27 DIAGNOSIS — R488 Other symbolic dysfunctions: Secondary | ICD-10-CM | POA: Diagnosis not present

## 2022-01-27 DIAGNOSIS — R2689 Other abnormalities of gait and mobility: Secondary | ICD-10-CM | POA: Diagnosis not present

## 2022-01-27 DIAGNOSIS — M6281 Muscle weakness (generalized): Secondary | ICD-10-CM | POA: Diagnosis not present

## 2022-01-28 DIAGNOSIS — Z20822 Contact with and (suspected) exposure to covid-19: Secondary | ICD-10-CM | POA: Diagnosis not present

## 2022-01-29 NOTE — Progress Notes (Signed)
Happy Birthday! (yesterday!) = ? ?no growth in urine culture. Hopefully she is feeling better after finishing the Macrobid. If she has not finished it, she can stop taking. ? ?Thx

## 2022-01-30 DIAGNOSIS — R2681 Unsteadiness on feet: Secondary | ICD-10-CM | POA: Diagnosis not present

## 2022-01-30 DIAGNOSIS — R2689 Other abnormalities of gait and mobility: Secondary | ICD-10-CM | POA: Diagnosis not present

## 2022-01-30 DIAGNOSIS — Z20822 Contact with and (suspected) exposure to covid-19: Secondary | ICD-10-CM | POA: Diagnosis not present

## 2022-01-30 DIAGNOSIS — R488 Other symbolic dysfunctions: Secondary | ICD-10-CM | POA: Diagnosis not present

## 2022-01-30 DIAGNOSIS — M6281 Muscle weakness (generalized): Secondary | ICD-10-CM | POA: Diagnosis not present

## 2022-01-30 DIAGNOSIS — R41841 Cognitive communication deficit: Secondary | ICD-10-CM | POA: Diagnosis not present

## 2022-01-31 DIAGNOSIS — R41841 Cognitive communication deficit: Secondary | ICD-10-CM | POA: Diagnosis not present

## 2022-01-31 DIAGNOSIS — R2681 Unsteadiness on feet: Secondary | ICD-10-CM | POA: Diagnosis not present

## 2022-01-31 DIAGNOSIS — R2689 Other abnormalities of gait and mobility: Secondary | ICD-10-CM | POA: Diagnosis not present

## 2022-01-31 DIAGNOSIS — M6281 Muscle weakness (generalized): Secondary | ICD-10-CM | POA: Diagnosis not present

## 2022-01-31 DIAGNOSIS — R488 Other symbolic dysfunctions: Secondary | ICD-10-CM | POA: Diagnosis not present

## 2022-02-01 ENCOUNTER — Telehealth: Payer: Self-pay | Admitting: Family Medicine

## 2022-02-01 DIAGNOSIS — R2681 Unsteadiness on feet: Secondary | ICD-10-CM | POA: Diagnosis not present

## 2022-02-01 DIAGNOSIS — R41841 Cognitive communication deficit: Secondary | ICD-10-CM | POA: Diagnosis not present

## 2022-02-01 DIAGNOSIS — R488 Other symbolic dysfunctions: Secondary | ICD-10-CM | POA: Diagnosis not present

## 2022-02-01 DIAGNOSIS — M6281 Muscle weakness (generalized): Secondary | ICD-10-CM | POA: Diagnosis not present

## 2022-02-01 DIAGNOSIS — R2689 Other abnormalities of gait and mobility: Secondary | ICD-10-CM | POA: Diagnosis not present

## 2022-02-01 NOTE — Telephone Encounter (Signed)
Pt gave a verbalized understanding. ?

## 2022-02-01 NOTE — Telephone Encounter (Signed)
Pt was returning a call back to our office regarding labs and is asking for a call back ?

## 2022-02-03 ENCOUNTER — Other Ambulatory Visit: Payer: Medicare Other

## 2022-02-03 ENCOUNTER — Ambulatory Visit
Admission: EM | Admit: 2022-02-03 | Discharge: 2022-02-03 | Disposition: A | Discharge status: Home / Self Care | Payer: Medicare Other | Attending: Emergency Medicine | Admitting: Emergency Medicine

## 2022-02-03 ENCOUNTER — Ambulatory Visit (INDEPENDENT_AMBULATORY_CARE_PROVIDER_SITE_OTHER): Payer: Medicare Other

## 2022-02-03 DIAGNOSIS — R42 Dizziness and giddiness: Secondary | ICD-10-CM | POA: Diagnosis not present

## 2022-02-03 DIAGNOSIS — R2681 Unsteadiness on feet: Secondary | ICD-10-CM | POA: Diagnosis not present

## 2022-02-03 DIAGNOSIS — E039 Hypothyroidism, unspecified: Secondary | ICD-10-CM

## 2022-02-03 DIAGNOSIS — R3129 Other microscopic hematuria: Secondary | ICD-10-CM | POA: Diagnosis not present

## 2022-02-03 DIAGNOSIS — R8281 Pyuria: Secondary | ICD-10-CM

## 2022-02-03 DIAGNOSIS — R9431 Abnormal electrocardiogram [ECG] [EKG]: Secondary | ICD-10-CM | POA: Diagnosis not present

## 2022-02-03 DIAGNOSIS — R488 Other symbolic dysfunctions: Secondary | ICD-10-CM | POA: Diagnosis not present

## 2022-02-03 DIAGNOSIS — R2689 Other abnormalities of gait and mobility: Secondary | ICD-10-CM | POA: Diagnosis not present

## 2022-02-03 DIAGNOSIS — M6281 Muscle weakness (generalized): Secondary | ICD-10-CM | POA: Diagnosis not present

## 2022-02-03 DIAGNOSIS — R41841 Cognitive communication deficit: Secondary | ICD-10-CM | POA: Diagnosis not present

## 2022-02-03 LAB — POCT URINALYSIS DIP (MANUAL ENTRY)
Bilirubin, UA: NEGATIVE
Glucose, UA: NEGATIVE mg/dL
Nitrite, UA: NEGATIVE
Protein Ur, POC: NEGATIVE mg/dL
Spec Grav, UA: 1.015 (ref 1.010–1.025)
Urobilinogen, UA: 0.2 E.U./dL
pH, UA: 6 (ref 5.0–8.0)

## 2022-02-03 MED ORDER — CEFDINIR 300 MG PO CAPS: 300.0000 mg | ORAL_CAPSULE | Freq: Two times a day (BID) | ORAL | 0 refills | Status: AC

## 2022-02-03 NOTE — ED Provider Notes (Signed)
?[facility] ? ? ? ?CSN: [id number] ?Arrival date & time: 02/03/22  1443 ?  ? ?HISTORY  ? ?Chief Complaint  ?Patient presents with  ? Dizziness  ? ?HPI ?Felicia Kelly is a 86 y.o. female. Patient presents to urgent care with her sister today.  Sister states that the administrator of the facility where her sister lives called her today stating the patient was not herself.  It was reported that the patient has been dizzy, nauseated and has been stumbling.  Sister states that patient was recently treated for urinary tract infection and has completed therapy.  Patient states she has not been urinating more frequently and is not currently having a burning with urination.  EMR reviewed, patient was seen at [facility] on January 25, 2022 and was treated with Macrobid for presumed urinary tract infection based on trace leukocytes in her urinalysis however the urine culture was negative.  Patient herself is a poor historian secondary to known diagnosis of Alzheimer's, patient states today she is not dizzy. ? ?The history is provided by the patient and a relative.  ?Past Medical History:  ?Diagnosis Date  ? Aortic atherosclerosis ([place])   ? a. noted on CT 01/2018.  ? Arthritis   ? Colonic polyp   ? Coronary atherosclerosis   ? a. noted on CT 01/2018.  ? Genital herpes   ? Hyperlipidemia   ? Hypertension   ? Mild carotid artery disease ([place])   ? Osteoporosis   ? Overdose of cardiac medication   ? Pulmonary nodules   ? a. followed by PCP by serial Ct.  ? Thyroid disease   ? seeing Dr. Buddy Duty  ? Tremor   ? ?Patient Active Problem List  ? Diagnosis Date Noted  ? SAH (subarachnoid hemorrhage) ([place]) 02/24/2020  ? Bradycardia 04/01/2019  ? Cystocele 02/13/2019  ? Hip pain 02/13/2019  ? Osteoarthritis of hip 02/13/2019  ? Subchondral cysts 02/13/2019  ? Vitamin D deficiency 02/13/2019  ? Aortic atherosclerosis ([place]) 10/14/2017  ? Solitary pulmonary nodule 10/23/2016  ? Hyperglycemia 07/11/2016  ? Hypercholesterolemia  06/04/2013  ? Hypothyroidism 06/04/2013  ? Osteopenia 06/27/2012  ? Malignant basal cell neoplasm of skin 02/14/2012  ? Benign essential hypertension 11/22/2011  ? Internal carotid artery stenosis 09/18/2011  ? Multinodular goiter 10/16/2009  ? Herpes simplex type 2 infection 10/05/2008  ? Single renal cyst 02/14/2008  ? ?Past Surgical History:  ?Procedure Laterality Date  ? ABDOMINAL HYSTERECTOMY  1988  ? no cancer - reports total with bilat oophorectomy  ? APPENDECTOMY  1988  ? BREAST CYST EXCISION Bilateral   ? BREAST SURGERY  1985  ? biopsy  ? FOOT SURGERY    ? multiple sugeries, remote  ? TOTAL HIP ARTHROPLASTY Bilateral   ? ?OB History   ?No obstetric history on file. ?  ? ?Home Medications   ? ?Prior to Admission medications   ?Medication Sig Start Date End Date Taking? Authorizing Provider  ?aspirin EC 81 MG tablet Take 81 mg by mouth every evening. Swallow whole.    [provider]  ?Cholecalciferol (VITAMIN D3 PO) Take 1,000 Units by mouth 3 (three) times a week.    [provider]  ?cyanocobalamin 100 MCG tablet Take by mouth.    [provider]  ?ezetimibe (ZETIA) 10 MG tablet Take 1 tablet (10 mg total) by mouth daily. 01/23/22   [name], MD  ?levothyroxine (SYNTHROID) 50 MCG tablet TAKE 1 TABLET ONCE DAILY BEFORE BREAKFAST. 12/22/20   [place],  Steele Berg, MD  ?levothyroxine (SYNTHROID) 50 MCG tablet 1 tablet six days a week, 2 tablets on sundays    [provider]  ?losartan-hydrochlorothiazide (HYZAAR) 100-12.5 MG tablet TAKE ONE TABLET BY MOUTH ONCE DAILY **NEED OFFICE VISIT** 08/22/21   Caren Macadam, MD  ?Magnesium 250 MG TABS Take by mouth.    [provider]  ?MELATONIN PO Take by mouth every evening.    [provider]  ?METAMUCIL FIBER PO Take by mouth every evening.    [provider]  ?metFORMIN (GLUCOPHAGE-XR) 500 MG 24 hr tablet TAKE ONE TABLET BY MOUTH DAILY WITH BREAKFAST **NEED OFFICE VISIT** 08/22/21    Caren Macadam, MD  ?Multiple Vitamin (MULTIVITAMIN) tablet Take 1 tablet by mouth daily.    [provider]  ?nitrofurantoin, macrocrystal-monohydrate, (MACROBID) 100 MG capsule Take 1 capsule (100 mg total) by mouth 2 (two) times daily. 01/25/22   Jeanie Sewer, NP  ?OVER THE COUNTER MEDICATION Occuvite-every morning    [provider]  ?potassium chloride (KLOR-CON) 10 MEQ tablet TAKE 1 TABLET THREE TIMES WEEKLY. 11/18/21   Caren Macadam, MD  ?pravastatin (PRAVACHOL) 40 MG tablet TAKE 1 TABLET ONCE DAILY. 02/09/21   Imogene Burn, PA-C  ?Probiotic Product (PROBIOTIC PO) Take by mouth every evening.    [provider]  ?rivastigmine (EXELON) 3 MG capsule TAKE ONE CAPSULE BY MOUTH TWICE DAILY. 11/18/21   Cameron Sprang, MD  ?valACYclovir (VALTREX) 500 MG tablet TAKE 1 TABLET ONCE DAILY. 08/22/21   Caren Macadam, MD  ? ?Family History ?Family History  ?Problem Relation Age of Onset  ? Pancreatic cancer Father   ? Heart disease Father   ? Heart attack Sister 37  ? Arthritis Mother   ? Hyperlipidemia Mother   ? Hypertension Mother   ? Heart disease Mother   ? Diabetes Mother   ? Liver disease Brother   ?     cancer of bile duct  ? Heart disease Maternal Grandfather   ? Heart attack Brother   ? Breast cancer Neg Hx   ? ?Social History ?Social History  ? ?Tobacco Use  ? Smoking status: Former  ?  Types: Cigarettes  ?  Quit date: 10/16/1978  ?  Years since quitting: 43.3  ? Smokeless tobacco: Never  ? Tobacco comments:  ?  41 when her father died;  smoked for a brief period of time  ?Vaping Use  ? Vaping Use: Never used  ?Substance Use Topics  ? Alcohol use: No  ?  Alcohol/week: 0.0 standard drinks  ? Drug use: No  ? ?Allergies   ?Tetracycline, Atorvastatin, Bactrim [sulfamethoxazole-trimethoprim], Celecoxib, Niacin, Niaspan [niacin er], Rosuvastatin, Simvastatin, Tetracyclines & related, and Penicillins ? ?Review of Systems ?Review of Systems ?Pertinent findings noted in  history of present illness.  ? ?Physical Exam ?Triage Vital Signs ?ED Triage Vitals  ?Enc Vitals Group  ?   BP 08/12/21 0827 (!) 147/82  ?   Pulse Rate 08/12/21 0827 72  ?   Resp 08/12/21 0827 18  ?   Temp 08/12/21 0827 98.3 ?F (36.8 ?C)  ?   Temp Source 08/12/21 0827 Oral  ?   SpO2 08/12/21 0827 98 %  ?   Weight --   ?   Height --   ?   Head Circumference --   ?   Peak Flow --   ?   Pain Score 08/12/21 0826 5  ?   Pain Loc --   ?  Pain Edu? --   ?   Excl. in [place]? --   ?No data found. ? ?Updated Vital Signs ?BP 109/71 (BP Location: Right Arm)   Pulse 74   Temp (!) 97.5 ?F (36.4 ?C) (Oral)   Resp 18   SpO2 97%  ? ?Physical Exam ?Vitals and nursing note reviewed.  ?Constitutional:   ?   General: She is awake. She is not in acute distress. ?   Appearance: Normal appearance. She is well-developed and normal weight. She is not ill-appearing, toxic-appearing or diaphoretic.  ?HENT:  ?   Head: Normocephalic and atraumatic.  ?Eyes:  ?   General: Lids are normal.     ?   Right eye: No discharge.     ?   Left eye: No discharge.  ?   Extraocular Movements: Extraocular movements intact.  ?   Conjunctiva/sclera: Conjunctivae normal.  ?   Right eye: Right conjunctiva is not injected.  ?   Left eye: Left conjunctiva is not injected.  ?Neck:  ?   Trachea: Trachea and phonation normal.  ?Cardiovascular:  ?   Rate and Rhythm: Normal rate and regular rhythm.  ?   Pulses: Normal pulses.  ?   Heart sounds: Normal heart sounds. No murmur heard. ?  No friction rub. No gallop.  ?Pulmonary:  ?   Effort: Pulmonary effort is normal. No accessory muscle usage, prolonged expiration or respiratory distress.  ?   Breath sounds: Normal breath sounds. No stridor, decreased air movement or transmitted upper airway sounds. No decreased breath sounds, wheezing, rhonchi or rales.  ?Chest:  ?   Chest wall: No tenderness.  ?Abdominal:  ?   General: Abdomen is flat. Bowel sounds are normal.  ?   Palpations: Abdomen is soft.  ?   Tenderness: There is  no abdominal tenderness.  ?Musculoskeletal:     ?   General: Normal range of motion.  ?   Cervical back: Normal range of motion and neck supple. Normal range of motion.  ?Lymphadenopathy:  ?   Cervical: No cervical adenopathy

## 2022-02-03 NOTE — ED Triage Notes (Addendum)
The patients sister states the Assistant living of the patient called today reporting the pt was not herself, she has been dizzy, nauseous and stumbling. She reports the patient was recently treated for a UTI (has completed abx therapy). The patient states she has not had any changes to her urinary pattern (no increased frequency, etc.) ?

## 2022-02-03 NOTE — Discharge Instructions (Addendum)
The urinalysis that we performed in the clinic today was abnormal.  Urine culture will be performed per our protocol.   ? ?You were advised to begin antibiotics today because of these abnormal findings.  It is very important that you take all doses exactly as prescribed.  Incomplete antibiotic therapy can cause worsening urinary tract infection that can become aggressive, reach the level of your kidneys causing kidney infection and possible hospitalization. ? ?I have sent a prescription for cefdinir to your pharmacy.  It has a very low cross-reactivity with medications that contain penicillin.  Because your main reaction to penicillin is a rash, this medication is considered safe for you to take.  Please take 1 capsule twice daily for the next 5 days. ? ?The result of the urine culture will be available in the next 3 to 5 days and will be posted to your MyChart account.  If there is an abnormal finding, you will be contacted by phone and advised of further treatment recommendations, if any. ? ?We performed blood tests to evaluate your thyroid function, your thyroid hormone levels, your electrolytes (sodium, potassium, calcium, sugar) your liver function and your kidney function.  The results of your blood tests will be posted to your MyChart account and will be made available to your primary care provider as well.  If there are abnormal findings, you will be contacted by phone and provided with treatment recommendations as needed. ? ?Your EKG today was concerning for low voltage.  Chest x-ray was performed to rule out some of the more serious causes of a low voltage EKG including effusion around the heart and an abnormally enlarged heart.  The chest x-ray showed that your heart is of normal size and there were no acute cardiopulmonary findings concerning for pericardial effusion or lung abnormalities. ? ?Thank you for visiting urgent care today.  We appreciate the opportunity to participate in your care. ?

## 2022-02-03 NOTE — ED Notes (Signed)
Provider is in the room with the patient.  ?

## 2022-02-04 LAB — COMPREHENSIVE METABOLIC PANEL
ALT: 15 IU/L (ref 0–32)
AST: 17 IU/L (ref 0–40)
Albumin/Globulin Ratio: 1.7 (ref 1.2–2.2)
Albumin: 4.1 g/dL (ref 3.6–4.6)
Alkaline Phosphatase: 70 IU/L (ref 44–121)
BUN/Creatinine Ratio: 26 (ref 12–28)
BUN: 22 mg/dL (ref 8–27)
Bilirubin Total: 0.2 mg/dL (ref 0.0–1.2)
CO2: 21 mmol/L (ref 20–29)
Calcium: 9.6 mg/dL (ref 8.7–10.3)
Chloride: 100 mmol/L (ref 96–106)
Creatinine, Ser: 0.84 mg/dL (ref 0.57–1.00)
Globulin, Total: 2.4 g/dL (ref 1.5–4.5)
Glucose: 96 mg/dL (ref 70–99)
Potassium: 3.2 mmol/L — ABNORMAL LOW (ref 3.5–5.2)
Sodium: 138 mmol/L (ref 134–144)
Total Protein: 6.5 g/dL (ref 6.0–8.5)
eGFR: 68 mL/min/{1.73_m2} (ref >59)

## 2022-02-04 LAB — T4, FREE: Free T4: 1.51 ng/dL (ref 0.82–1.77)

## 2022-02-04 LAB — TSH: TSH: 1.03 u[IU]/mL (ref 0.450–4.500)

## 2022-02-04 LAB — T3, FREE: T3, Free: 1.9 pg/mL — ABNORMAL LOW (ref 2.0–4.4)

## 2022-02-06 DIAGNOSIS — M6281 Muscle weakness (generalized): Secondary | ICD-10-CM | POA: Diagnosis not present

## 2022-02-06 DIAGNOSIS — R41841 Cognitive communication deficit: Secondary | ICD-10-CM | POA: Diagnosis not present

## 2022-02-06 DIAGNOSIS — R2689 Other abnormalities of gait and mobility: Secondary | ICD-10-CM | POA: Diagnosis not present

## 2022-02-06 DIAGNOSIS — R488 Other symbolic dysfunctions: Secondary | ICD-10-CM | POA: Diagnosis not present

## 2022-02-06 DIAGNOSIS — R2681 Unsteadiness on feet: Secondary | ICD-10-CM | POA: Diagnosis not present

## 2022-02-07 DIAGNOSIS — R41841 Cognitive communication deficit: Secondary | ICD-10-CM | POA: Diagnosis not present

## 2022-02-07 DIAGNOSIS — R2689 Other abnormalities of gait and mobility: Secondary | ICD-10-CM | POA: Diagnosis not present

## 2022-02-07 DIAGNOSIS — M6281 Muscle weakness (generalized): Secondary | ICD-10-CM | POA: Diagnosis not present

## 2022-02-07 DIAGNOSIS — R488 Other symbolic dysfunctions: Secondary | ICD-10-CM | POA: Diagnosis not present

## 2022-02-07 DIAGNOSIS — R2681 Unsteadiness on feet: Secondary | ICD-10-CM | POA: Diagnosis not present

## 2022-02-07 LAB — URINE CULTURE: Culture: NO GROWTH

## 2022-02-07 NOTE — Progress Notes (Deleted)
?Cardiology Office Note:   ? ?Date:  02/07/2022  ? ?ID:  [place], [name] [birth year], MRN [id number] ? ?PCP:  Caren Macadam, MD ?  ?[facility] Providers ?Cardiologist:  Ena Dawley, MD { ? ? ?Referring MD: Caren Macadam, MD  ? ? ?History of Present Illness:   ? ?Felicia Kelly is a 86 y.o. female with a hx of HTN, HLD, family history of CAD, and mild carotid artery disease who was previously followed by Dr. [name] who now presents to clinic for follow-up. ? ?Was last seen by [name], NP where she was doing well. Was living at [facility] and remained active. ? ?Past Medical History:  ?Diagnosis Date  ? Aortic atherosclerosis ([place])   ? a. noted on CT 01/2018.  ? Arthritis   ? Colonic polyp   ? Coronary atherosclerosis   ? a. noted on CT 01/2018.  ? Genital herpes   ? Hyperlipidemia   ? Hypertension   ? Mild carotid artery disease ([place])   ? Osteoporosis   ? Overdose of cardiac medication   ? Pulmonary nodules   ? a. followed by PCP by serial Ct.  ? Thyroid disease   ? seeing Dr. Buddy Duty  ? Tremor   ? ? ?Past Surgical History:  ?Procedure Laterality Date  ? ABDOMINAL HYSTERECTOMY  1988  ? no cancer - reports total with bilat oophorectomy  ? APPENDECTOMY  1988  ? BREAST CYST EXCISION Bilateral   ? BREAST SURGERY  1985  ? biopsy  ? FOOT SURGERY    ? multiple sugeries, remote  ? TOTAL HIP ARTHROPLASTY Bilateral   ? ? ?Current Medications: ?No outpatient medications have been marked as taking for the 02/13/22 encounter (Appointment) with [name], MD.  ?  ? ?Allergies:   Tetracycline, Atorvastatin, Bactrim [sulfamethoxazole-trimethoprim], Celecoxib, Niacin, Niaspan [niacin er], Rosuvastatin, Simvastatin, Tetracyclines & related, and Penicillins  ? ?Social History  ? ?Socioeconomic History  ? Marital status: Widowed  ?  Spouse name: Not on file  ? Number of children: Not on file  ? Years of education: Not on file  ? Highest education level: Not on file  ?Occupational History  ?  Not on file  ?Tobacco Use  ? Smoking status: Former  ?  Types: Cigarettes  ?  Quit date: 10/16/1978  ?  Years since quitting: 43.3  ? Smokeless tobacco: Never  ? Tobacco comments:  ?  74 when her father died;  smoked for a brief period of time  ?Vaping Use  ? Vaping Use: Never used  ?Substance and Sexual Activity  ? Alcohol use: No  ?  Alcohol/week: 0.0 standard drinks  ? Drug use: No  ? Sexual activity: Not on file  ?Other Topics Concern  ? Not on file  ?Social History Narrative  ? Work or School: none  ?   ? Home Situation: widower, lives alone  ?   ? Spiritual Beliefs: Christian  ?   ? Lifestyle: no regular exercise; diet is ok  ?   ? Right handed  ?   ?   ? ?Social Determinants of Health  ? ?Financial Resource Strain: Not on file  ?Food Insecurity: Not on file  ?Transportation Needs: Not on file  ?Physical Activity: Not on file  ?Stress: Not on file  ?Social Connections: Not on file  ?  ? ?Family History: ?The patient's ***family history includes Arthritis in her mother; Diabetes in her mother; Heart attack in her brother; Heart attack (  age of onset: 35) in her sister; Heart disease in her father, maternal grandfather, and mother; Hyperlipidemia in her mother; Hypertension in her mother; Liver disease in her brother; Pancreatic cancer in her father. There is no history of Breast cancer. ? ?ROS:   ?Please see the history of present illness.    ?*** All other systems reviewed and are negative. ? ?EKGs/Labs/Other Studies Reviewed:   ? ?The following studies were reviewed today: ?Carotid Doppler Summary 01/2020:  ?Right Carotid: Velocities in the right ICA are consistent with a 1-39%  ?stenosis.  ? ?Left Carotid: Velocities in the left ICA are consistent with a 1-39%  ?stenosis.  ? ?Vertebrals:  Bilateral vertebral arteries demonstrate antegrade flow.  ?Subclavians: Normal flow hemodynamics were seen in bilateral subclavian  ?arteries.  ? ?*See table(s) above for measurements and observations.  ? ?Electronically  signed by Ida Rogue MD on 02/02/2020 at 6:36:41 PM.  ?   ?  ?  ?NST 2017Study Highlights ?  ?  ?Nuclear stress EF: 61%. ?There was no ST segment deviation noted during stress. ?Blood pressure demonstrated a normal response to exercise. ?The study is normal. ?This is a low risk study. ?The left ventricular ejection fraction is normal (55-65%). ?  ?Normal exercise nuclear stress test with no evidence of prior infarct or ischemia.  ?Normal exercise capacity. ?Normal BP response to stress.  ?  ? ?EKG:  EKG is *** ordered today.  The ekg ordered today demonstrates *** ? ?Recent Labs: ?12/21/2021: Hemoglobin 13.7; Platelets 340.0 ?02/03/2022: ALT 15; BUN 22; Creatinine, Ser 0.84; Potassium 3.2; Sodium 138; TSH 1.030  ?Recent Lipid Panel ?   ?Component Value Date/Time  ? CHOL 137 12/21/2021 1609  ? CHOL 156 08/03/2020 1048  ? TRIG 142.0 12/21/2021 1609  ? HDL 48.90 12/21/2021 1609  ? HDL 41 08/03/2020 1048  ? CHOLHDL 3 12/21/2021 1609  ? VLDL 28.4 12/21/2021 1609  ? LDLCALC 59 12/21/2021 1609  ? [place] 83 08/03/2020 1048  ? ? ? ?Risk Assessment/Calculations:   ?{Does this patient have ATRIAL FIBRILLATION?:[phone]} ? ?    ? ?Physical Exam:   ? ?VS:  There were no vitals taken for this visit.   ? ?Wt Readings from Last 3 Encounters:  ?01/25/22 145 lb (65.8 kg)  ?12/21/21 145 lb 12.8 oz (66.1 kg)  ?08/29/21 158 lb (71.7 kg)  ?  ? ?GEN: *** Well nourished, well developed in no acute distress ?HEENT: Normal ?NECK: No JVD; No carotid bruits ?LYMPHATICS: No lymphadenopathy ?CARDIAC: ***RRR, no murmurs, rubs, gallops ?RESPIRATORY:  Clear to auscultation without rales, wheezing or rhonchi  ?ABDOMEN: Soft, non-tender, non-distended ?MUSCULOSKELETAL:  No edema; No deformity  ?SKIN: Warm and dry ?NEUROLOGIC:  Alert and oriented x 3 ?PSYCHIATRIC:  Normal affect  ? ?ASSESSMENT:   ? ?No diagnosis found. ?PLAN:   ? ?In order of problems listed above: ? ?#Hypertension: ?-Continue losartan-HCTZ 100-12.'5mg'$  daily ? ?#HLD: ?-Continue  zetia '10mg'$   daily ?-Continue pravastatin '40mg'$  daily ? ?#Mild Carotid Disease: ?-Continue ASA '81mg'$  daily, continue zetia '10mg'$  daily ?-Continue pravastatin '40mg'$  daily ?   ? ?{Are you ordering a CV Procedure (e.g. stress test, cath, DCCV, TEE, etc)?   Press F2        :[id number]}  ? ? ?Medication Adjustments/Labs and Tests Ordered: ?Current medicines are reviewed at length with the patient today.  Concerns regarding medicines are outlined above.  ?No orders of the defined types were placed in this encounter. ? ?No orders of the defined types were placed in this  encounter. ? ? ?There are no Patient Instructions on file for this visit.  ? ?Signed, ?[name], MD  ?02/07/2022 1:06 PM    ?[place] ?

## 2022-02-08 DIAGNOSIS — M6281 Muscle weakness (generalized): Secondary | ICD-10-CM | POA: Diagnosis not present

## 2022-02-08 DIAGNOSIS — R488 Other symbolic dysfunctions: Secondary | ICD-10-CM | POA: Diagnosis not present

## 2022-02-08 DIAGNOSIS — R2689 Other abnormalities of gait and mobility: Secondary | ICD-10-CM | POA: Diagnosis not present

## 2022-02-08 DIAGNOSIS — Z23 Encounter for immunization: Secondary | ICD-10-CM | POA: Diagnosis not present

## 2022-02-08 DIAGNOSIS — R2681 Unsteadiness on feet: Secondary | ICD-10-CM | POA: Diagnosis not present

## 2022-02-08 DIAGNOSIS — Z20822 Contact with and (suspected) exposure to covid-19: Secondary | ICD-10-CM | POA: Diagnosis not present

## 2022-02-08 DIAGNOSIS — R41841 Cognitive communication deficit: Secondary | ICD-10-CM | POA: Diagnosis not present

## 2022-02-09 DIAGNOSIS — R488 Other symbolic dysfunctions: Secondary | ICD-10-CM | POA: Diagnosis not present

## 2022-02-09 DIAGNOSIS — R2681 Unsteadiness on feet: Secondary | ICD-10-CM | POA: Diagnosis not present

## 2022-02-09 DIAGNOSIS — R2689 Other abnormalities of gait and mobility: Secondary | ICD-10-CM | POA: Diagnosis not present

## 2022-02-09 DIAGNOSIS — M6281 Muscle weakness (generalized): Secondary | ICD-10-CM | POA: Diagnosis not present

## 2022-02-09 DIAGNOSIS — Z20822 Contact with and (suspected) exposure to covid-19: Secondary | ICD-10-CM | POA: Diagnosis not present

## 2022-02-09 DIAGNOSIS — R41841 Cognitive communication deficit: Secondary | ICD-10-CM | POA: Diagnosis not present

## 2022-02-10 DIAGNOSIS — R2681 Unsteadiness on feet: Secondary | ICD-10-CM | POA: Diagnosis not present

## 2022-02-10 DIAGNOSIS — M6281 Muscle weakness (generalized): Secondary | ICD-10-CM | POA: Diagnosis not present

## 2022-02-10 DIAGNOSIS — R2689 Other abnormalities of gait and mobility: Secondary | ICD-10-CM | POA: Diagnosis not present

## 2022-02-10 DIAGNOSIS — R488 Other symbolic dysfunctions: Secondary | ICD-10-CM | POA: Diagnosis not present

## 2022-02-10 DIAGNOSIS — R41841 Cognitive communication deficit: Secondary | ICD-10-CM | POA: Diagnosis not present

## 2022-02-13 ENCOUNTER — Encounter: Payer: Self-pay | Admitting: Cardiology

## 2022-02-13 ENCOUNTER — Ambulatory Visit (INDEPENDENT_AMBULATORY_CARE_PROVIDER_SITE_OTHER): Payer: Medicare Other | Admitting: Cardiology

## 2022-02-13 VITALS — BP 120/60 | HR 78 | Ht 62.0 in | Wt 142.0 lb

## 2022-02-13 DIAGNOSIS — R2681 Unsteadiness on feet: Secondary | ICD-10-CM | POA: Diagnosis not present

## 2022-02-13 DIAGNOSIS — E785 Hyperlipidemia, unspecified: Secondary | ICD-10-CM

## 2022-02-13 DIAGNOSIS — E876 Hypokalemia: Secondary | ICD-10-CM | POA: Diagnosis not present

## 2022-02-13 DIAGNOSIS — I6523 Occlusion and stenosis of bilateral carotid arteries: Secondary | ICD-10-CM

## 2022-02-13 DIAGNOSIS — I10 Essential (primary) hypertension: Secondary | ICD-10-CM

## 2022-02-13 DIAGNOSIS — Z79899 Other long term (current) drug therapy: Secondary | ICD-10-CM | POA: Diagnosis not present

## 2022-02-13 DIAGNOSIS — R41841 Cognitive communication deficit: Secondary | ICD-10-CM | POA: Diagnosis not present

## 2022-02-13 DIAGNOSIS — Z20822 Contact with and (suspected) exposure to covid-19: Secondary | ICD-10-CM | POA: Diagnosis not present

## 2022-02-13 DIAGNOSIS — M6281 Muscle weakness (generalized): Secondary | ICD-10-CM | POA: Diagnosis not present

## 2022-02-13 DIAGNOSIS — R2689 Other abnormalities of gait and mobility: Secondary | ICD-10-CM | POA: Diagnosis not present

## 2022-02-13 DIAGNOSIS — R488 Other symbolic dysfunctions: Secondary | ICD-10-CM | POA: Diagnosis not present

## 2022-02-13 NOTE — Patient Instructions (Signed)
Medication Instructions:  ? ?Your physician recommends that you continue on your current medications as directed. Please refer to the Current Medication list given to you today. ? ?*If you need a refill on your cardiac medications before your next appointment, please call your pharmacy* ? ? ?Lab Work: ? ?TODAY--BMET ? ?If you have labs (blood work) drawn today and your tests are completely normal, you will receive your results only by: ?MyChart Message (if you have MyChart) OR ?A paper copy in the mail ?If you have any lab test that is abnormal or we need to change your treatment, we will call you to review the results. ? ? ?Testing/Procedures: ? ?Your physician has requested that you have a carotid duplex. This test is an ultrasound of the carotid arteries in your neck. It looks at blood flow through these arteries that supply the brain with blood. Allow one hour for this exam. There are no restrictions or special instructions.  SCHEDULE TO BE DONE IN April 2024 PER DR. Johney Frame ? ? ? ?Follow-Up: ?At [facility], you and your health needs are our priority.  As part of our continuing mission to provide you with exceptional heart care, we have created designated Provider Care Teams.  These Care Teams include your primary Cardiologist (physician) and Advanced Practice Providers (APPs -  Physician Assistants and Nurse Practitioners) who all work together to provide you with the care you need, when you need it. ? ?We recommend signing up for the patient portal called "MyChart".  Sign up information is provided on this After Visit Summary.  MyChart is used to connect with patients for Virtual Visits (Telemedicine).  Patients are able to view lab/test results, encounter notes, upcoming appointments, etc.  Non-urgent messages can be sent to your provider as well.   ?To learn more about what you can do with MyChart, go to [email or web address].   ? ?Your next appointment:   ?1 year(s) ? ?The format for your next  appointment:   ?In Person ? ?Provider:   ?DR. PEMBERTON  ? ?Important Information About Sugar ? ? ? ? ? ? ?

## 2022-02-13 NOTE — Progress Notes (Signed)
?Cardiology Office Note:   ? ?Date:  02/13/2022  ? ?ID:  [place], [name] 1936/06/06, MRN [id number] ? ?PCP:  Caren Macadam, MD ?  ?[facility] Providers ?Cardiologist:  Ena Dawley, MD { ? ? ?Referring MD: Caren Macadam, MD  ? ? ?History of Present Illness:   ? ?Felicia Kelly is a 86 y.o. female with a hx of HTN, HLD, family history of CAD, and mild carotid artery disease who was previously followed by Dr. [name] who now presents to clinic for follow-up. ? ?Was last seen by [name], NP where she was doing well. Was living at [facility] and remained active. ? ?She presented to urgent care 02/03/2022. She has a known diagnosis of Alzheimer's. It was noted that the administrator of [name] had called her sister and reported the patient was not herself, feeling dizzy, nauseated, and was stumbling. Her sister also noted she had been recently treated for a UTI. A urine dip 4/21 was positive for glucosuria and pyuria, and she was advised to begin cefdinir. ? ?She is accompanied by her sister, who also provides the history. Today, the patient states that her breathing is good. Her blood pressure is monitored by [facility]. She is participating in PT, OT, and speech therapy. ? ?Recent lab work shows her LDL 59, and total cholesterol 137. Her potassium was found to be low at 3.2. Currently she is taking potassium 3 times a week. Her sister notes that she has been feeling fatigued lately. ? ?There was some hematuria noted in recent urine tests. Her sister reports this has been intermittent for years but not at a concerning level. At this time they do not believe she has any hematuria. Currently no other bleeding issues on aspirin. ? ?She denies any palpitations, chest pain, shortness of breath, or peripheral edema. No lightheadedness, headaches, syncope, orthopnea, or PND. ? ? ?Past Medical History:  ?Diagnosis Date  ? Aortic atherosclerosis ([place])   ? a. noted on CT 01/2018.   ? Arthritis   ? Colonic polyp   ? Coronary atherosclerosis   ? a. noted on CT 01/2018.  ? Genital herpes   ? Hyperlipidemia   ? Hypertension   ? Mild carotid artery disease ([place])   ? Osteoporosis   ? Overdose of cardiac medication   ? Pulmonary nodules   ? a. followed by PCP by serial Ct.  ? Thyroid disease   ? seeing Dr. Buddy Duty  ? Tremor   ? ? ?Past Surgical History:  ?Procedure Laterality Date  ? ABDOMINAL HYSTERECTOMY  1988  ? no cancer - reports total with bilat oophorectomy  ? APPENDECTOMY  1988  ? BREAST CYST EXCISION Bilateral   ? BREAST SURGERY  1985  ? biopsy  ? FOOT SURGERY    ? multiple sugeries, remote  ? TOTAL HIP ARTHROPLASTY Bilateral   ? ? ?Current Medications: ?Current Meds  ?Medication Sig  ? aspirin EC 81 MG tablet Take 81 mg by mouth every evening. Swallow whole.  ? Cholecalciferol (VITAMIN D3 PO) Take 1,000 Units by mouth 3 (three) times a week.  ? cyanocobalamin 100 MCG tablet Take by mouth.  ? ezetimibe (ZETIA) 10 MG tablet Take 1 tablet (10 mg total) by mouth daily.  ? levothyroxine (SYNTHROID) 50 MCG tablet 1 tablet six days a week, 2 tablets on sundays  ? losartan-hydrochlorothiazide (HYZAAR) 100-12.5 MG tablet TAKE ONE TABLET BY MOUTH ONCE DAILY **NEED OFFICE VISIT**  ? Magnesium 250 MG TABS Take  by mouth.  ? MELATONIN PO Take by mouth every evening.  ? METAMUCIL FIBER PO Take by mouth every evening.  ? metFORMIN (GLUCOPHAGE-XR) 500 MG 24 hr tablet TAKE ONE TABLET BY MOUTH DAILY WITH BREAKFAST **NEED OFFICE VISIT**  ? Multiple Vitamin (MULTIVITAMIN) tablet Take 1 tablet by mouth daily.  ? OVER THE COUNTER MEDICATION Occuvite-every morning  ? potassium chloride (KLOR-CON) 10 MEQ tablet TAKE 1 TABLET THREE TIMES WEEKLY.  ? pravastatin (PRAVACHOL) 40 MG tablet TAKE 1 TABLET ONCE DAILY.  ? Probiotic Product (PROBIOTIC PO) Take by mouth every evening.  ? rivastigmine (EXELON) 3 MG capsule TAKE ONE CAPSULE BY MOUTH TWICE DAILY.  ? valACYclovir (VALTREX) 500 MG tablet TAKE 1 TABLET ONCE DAILY.  ?   ? ?Allergies:   Tetracycline, Atorvastatin, Bactrim [sulfamethoxazole-trimethoprim], Celecoxib, Niacin, Niaspan [niacin er], Rosuvastatin, Simvastatin, Tetracyclines & related, and Penicillins  ? ?Social History  ? ?Socioeconomic History  ? Marital status: Widowed  ?  Spouse name: Not on file  ? Number of children: Not on file  ? Years of education: Not on file  ? Highest education level: Not on file  ?Occupational History  ? Not on file  ?Tobacco Use  ? Smoking status: Former  ?  Types: Cigarettes  ?  Quit date: 10/16/1978  ?  Years since quitting: 43.3  ? Smokeless tobacco: Never  ? Tobacco comments:  ?  26 when her father died;  smoked for a brief period of time  ?Vaping Use  ? Vaping Use: Never used  ?Substance and Sexual Activity  ? Alcohol use: No  ?  Alcohol/week: 0.0 standard drinks  ? Drug use: No  ? Sexual activity: Not on file  ?Other Topics Concern  ? Not on file  ?Social History Narrative  ? Work or School: none  ?   ? Home Situation: widower, lives alone  ?   ? Spiritual Beliefs: Christian  ?   ? Lifestyle: no regular exercise; diet is ok  ?   ? Right handed  ?   ?   ? ?Social Determinants of Health  ? ?Financial Resource Strain: Not on file  ?Food Insecurity: Not on file  ?Transportation Needs: Not on file  ?Physical Activity: Not on file  ?Stress: Not on file  ?Social Connections: Not on file  ?  ? ?Family History: ?The patient's family history includes Arthritis in her mother; Diabetes in her mother; Heart attack in her brother; Heart attack (age of onset: 50) in her sister; Heart disease in her father, maternal grandfather, and mother; Hyperlipidemia in her mother; Hypertension in her mother; Liver disease in her brother; Pancreatic cancer in her father. There is no history of Breast cancer. ? ?ROS:   ?Review of Systems  ?Constitutional:  Positive for malaise/fatigue. Negative for chills and fever.  ?HENT:  Negative for congestion and nosebleeds.   ?Eyes:  Negative for photophobia.  ?Respiratory:   Negative for cough and hemoptysis.   ?Cardiovascular:  Negative for chest pain, palpitations, orthopnea, claudication, leg swelling and PND.  ?Gastrointestinal:  Negative for diarrhea and vomiting.  ?Genitourinary:  Negative for urgency.  ?Musculoskeletal:  Negative for falls.  ?Neurological:  Negative for dizziness and loss of consciousness.  ?Endo/Heme/Allergies:  Negative for environmental allergies.  ?Psychiatric/Behavioral:  Negative for depression. The patient is not nervous/anxious.   ? ? ?EKGs/Labs/Other Studies Reviewed:   ? ?The following studies were reviewed today: ? ?Right LE Venous Doppler 08/10/2020 ([place]): ?IMPRESSION:  ?Sonographic survey of the right lower  extremity negative for DVT   ? ?Carotid Doppler Summary 01/2020:  ?Right Carotid: Velocities in the right ICA are consistent with a 1-39%  ?stenosis.  ? ?Left Carotid: Velocities in the left ICA are consistent with a 1-39%  ?stenosis.  ? ?Vertebrals:  Bilateral vertebral arteries demonstrate antegrade flow.  ?Subclavians: Normal flow hemodynamics were seen in bilateral subclavian  ?arteries.  ? ?*See table(s) above for measurements and observations.  ? ?Electronically signed by Ida Rogue MD on 02/02/2020 at 6:36:41 PM.  ?   ?NST 2017 Study Highlights ?  ?  ?Nuclear stress EF: 61%. ?There was no ST segment deviation noted during stress. ?Blood pressure demonstrated a normal response to exercise. ?The study is normal. ?This is a low risk study. ?The left ventricular ejection fraction is normal (55-65%). ?  ?Normal exercise nuclear stress test with no evidence of prior infarct or ischemia.  ?Normal exercise capacity. ?Normal BP response to stress.  ?  ? ?EKG:  EKG is personally reviewed. ?02/13/2022: EKG was not ordered. ?02/03/2022 (Urgent Care): Concerning for low voltage. ? ?Recent Labs: ?12/21/2021: Hemoglobin 13.7; Platelets 340.0 ?02/03/2022: ALT 15; BUN 22; Creatinine, Ser 0.84; Potassium 3.2; Sodium 138; TSH 1.030  ? ?Recent Lipid  Panel ?   ?Component Value Date/Time  ? CHOL 137 12/21/2021 1609  ? CHOL 156 08/03/2020 1048  ? TRIG 142.0 12/21/2021 1609  ? HDL 48.90 12/21/2021 1609  ? HDL 41 08/03/2020 1048  ? CHOLHDL 3 12/22/18

## 2022-02-14 ENCOUNTER — Telehealth: Payer: Self-pay | Admitting: *Deleted

## 2022-02-14 DIAGNOSIS — R2689 Other abnormalities of gait and mobility: Secondary | ICD-10-CM | POA: Diagnosis not present

## 2022-02-14 DIAGNOSIS — R2681 Unsteadiness on feet: Secondary | ICD-10-CM | POA: Diagnosis not present

## 2022-02-14 DIAGNOSIS — R41841 Cognitive communication deficit: Secondary | ICD-10-CM | POA: Diagnosis not present

## 2022-02-14 DIAGNOSIS — R488 Other symbolic dysfunctions: Secondary | ICD-10-CM | POA: Diagnosis not present

## 2022-02-14 DIAGNOSIS — M6281 Muscle weakness (generalized): Secondary | ICD-10-CM | POA: Diagnosis not present

## 2022-02-14 LAB — BASIC METABOLIC PANEL
BUN/Creatinine Ratio: 21 (ref 12–28)
BUN: 22 mg/dL (ref 8–27)
CO2: 21 mmol/L (ref 20–29)
Calcium: 9.7 mg/dL (ref 8.7–10.3)
Chloride: 103 mmol/L (ref 96–106)
Creatinine, Ser: 1.03 mg/dL — ABNORMAL HIGH (ref 0.57–1.00)
Glucose: 160 mg/dL — ABNORMAL HIGH (ref 70–99)
Potassium: 3.4 mmol/L — ABNORMAL LOW (ref 3.5–5.2)
Sodium: 142 mmol/L (ref 134–144)
eGFR: 54 mL/min/{1.73_m2} — ABNORMAL LOW (ref >59)

## 2022-02-14 MED ORDER — EZETIMIBE 10 MG PO TABS: 10.0000 mg | ORAL_TABLET | Freq: Every day | ORAL | 2 refills | Status: DC

## 2022-02-14 MED ORDER — POTASSIUM CHLORIDE CRYS ER 10 MEQ PO TBCR
EXTENDED_RELEASE_TABLET | ORAL | 0 refills | Status: DC
Start: 2022-02-14 — End: 2022-02-27

## 2022-02-14 NOTE — Telephone Encounter (Signed)
-----   Message from [name], MD sent at 02/14/2022 11:40 AM EDT ----- ?Her potassium is still on the low side. Can we have her take 53mq once and then increase her to 128m daily.  ?

## 2022-02-14 NOTE — Telephone Encounter (Signed)
Pts Sister Felicia Kelly (on [place] and receives pts results due to dementia) has been notified of pts lab results and recommendations.  ?Felicia Kelly is aware that pt will need to take KDUR 40 mEq po for one dose, then decrease to taking 10 mEq po daily thereafter.  ?Confirmed the pharmacy of choice with the sister.  ?Per Felicia Kelly, the pt lives at [facility] and they have a private service go in to manage and give pt her meds 2 times a week.  Per Felicia Kelly, we just need to send the med changes in and she will endorse this to [facility].  HG uses [facility] for the pt.  ?Prescriptions sent in. ?Sister verbalized understanding and agrees with this plan. ?

## 2022-02-16 ENCOUNTER — Other Ambulatory Visit: Payer: Self-pay | Admitting: Family Medicine

## 2022-02-16 ENCOUNTER — Other Ambulatory Visit: Payer: Self-pay | Admitting: Physician Assistant

## 2022-02-16 DIAGNOSIS — M6281 Muscle weakness (generalized): Secondary | ICD-10-CM | POA: Diagnosis not present

## 2022-02-16 DIAGNOSIS — E7800 Pure hypercholesterolemia, unspecified: Secondary | ICD-10-CM

## 2022-02-16 DIAGNOSIS — R488 Other symbolic dysfunctions: Secondary | ICD-10-CM | POA: Diagnosis not present

## 2022-02-16 DIAGNOSIS — R2689 Other abnormalities of gait and mobility: Secondary | ICD-10-CM | POA: Diagnosis not present

## 2022-02-16 DIAGNOSIS — R41841 Cognitive communication deficit: Secondary | ICD-10-CM | POA: Diagnosis not present

## 2022-02-16 DIAGNOSIS — R2681 Unsteadiness on feet: Secondary | ICD-10-CM | POA: Diagnosis not present

## 2022-02-17 DIAGNOSIS — R41841 Cognitive communication deficit: Secondary | ICD-10-CM | POA: Diagnosis not present

## 2022-02-17 DIAGNOSIS — M6281 Muscle weakness (generalized): Secondary | ICD-10-CM | POA: Diagnosis not present

## 2022-02-17 DIAGNOSIS — R2689 Other abnormalities of gait and mobility: Secondary | ICD-10-CM | POA: Diagnosis not present

## 2022-02-17 DIAGNOSIS — R2681 Unsteadiness on feet: Secondary | ICD-10-CM | POA: Diagnosis not present

## 2022-02-17 DIAGNOSIS — R488 Other symbolic dysfunctions: Secondary | ICD-10-CM | POA: Diagnosis not present

## 2022-02-20 DIAGNOSIS — R2681 Unsteadiness on feet: Secondary | ICD-10-CM | POA: Diagnosis not present

## 2022-02-20 DIAGNOSIS — R488 Other symbolic dysfunctions: Secondary | ICD-10-CM | POA: Diagnosis not present

## 2022-02-20 DIAGNOSIS — R41841 Cognitive communication deficit: Secondary | ICD-10-CM | POA: Diagnosis not present

## 2022-02-20 DIAGNOSIS — M6281 Muscle weakness (generalized): Secondary | ICD-10-CM | POA: Diagnosis not present

## 2022-02-20 DIAGNOSIS — Z20822 Contact with and (suspected) exposure to covid-19: Secondary | ICD-10-CM | POA: Diagnosis not present

## 2022-02-20 DIAGNOSIS — R2689 Other abnormalities of gait and mobility: Secondary | ICD-10-CM | POA: Diagnosis not present

## 2022-02-21 DIAGNOSIS — M6281 Muscle weakness (generalized): Secondary | ICD-10-CM | POA: Diagnosis not present

## 2022-02-21 DIAGNOSIS — R2689 Other abnormalities of gait and mobility: Secondary | ICD-10-CM | POA: Diagnosis not present

## 2022-02-21 DIAGNOSIS — R488 Other symbolic dysfunctions: Secondary | ICD-10-CM | POA: Diagnosis not present

## 2022-02-21 DIAGNOSIS — R41841 Cognitive communication deficit: Secondary | ICD-10-CM | POA: Diagnosis not present

## 2022-02-21 DIAGNOSIS — R2681 Unsteadiness on feet: Secondary | ICD-10-CM | POA: Diagnosis not present

## 2022-02-22 ENCOUNTER — Other Ambulatory Visit: Payer: Self-pay | Admitting: Neurology

## 2022-02-22 DIAGNOSIS — M6281 Muscle weakness (generalized): Secondary | ICD-10-CM | POA: Diagnosis not present

## 2022-02-22 DIAGNOSIS — R2689 Other abnormalities of gait and mobility: Secondary | ICD-10-CM | POA: Diagnosis not present

## 2022-02-22 DIAGNOSIS — R488 Other symbolic dysfunctions: Secondary | ICD-10-CM | POA: Diagnosis not present

## 2022-02-22 DIAGNOSIS — R2681 Unsteadiness on feet: Secondary | ICD-10-CM | POA: Diagnosis not present

## 2022-02-22 DIAGNOSIS — R41841 Cognitive communication deficit: Secondary | ICD-10-CM | POA: Diagnosis not present

## 2022-02-23 DIAGNOSIS — R488 Other symbolic dysfunctions: Secondary | ICD-10-CM | POA: Diagnosis not present

## 2022-02-23 DIAGNOSIS — R2681 Unsteadiness on feet: Secondary | ICD-10-CM | POA: Diagnosis not present

## 2022-02-23 DIAGNOSIS — R2689 Other abnormalities of gait and mobility: Secondary | ICD-10-CM | POA: Diagnosis not present

## 2022-02-23 DIAGNOSIS — M6281 Muscle weakness (generalized): Secondary | ICD-10-CM | POA: Diagnosis not present

## 2022-02-23 DIAGNOSIS — R41841 Cognitive communication deficit: Secondary | ICD-10-CM | POA: Diagnosis not present

## 2022-02-24 DIAGNOSIS — R2681 Unsteadiness on feet: Secondary | ICD-10-CM | POA: Diagnosis not present

## 2022-02-24 DIAGNOSIS — R41841 Cognitive communication deficit: Secondary | ICD-10-CM | POA: Diagnosis not present

## 2022-02-24 DIAGNOSIS — R2689 Other abnormalities of gait and mobility: Secondary | ICD-10-CM | POA: Diagnosis not present

## 2022-02-24 DIAGNOSIS — M6281 Muscle weakness (generalized): Secondary | ICD-10-CM | POA: Diagnosis not present

## 2022-02-24 DIAGNOSIS — R488 Other symbolic dysfunctions: Secondary | ICD-10-CM | POA: Diagnosis not present

## 2022-02-25 ENCOUNTER — Other Ambulatory Visit: Payer: Self-pay | Admitting: Neurology

## 2022-02-27 ENCOUNTER — Encounter: Payer: Self-pay | Admitting: Physician Assistant

## 2022-02-27 ENCOUNTER — Other Ambulatory Visit: Payer: Self-pay | Admitting: *Deleted

## 2022-02-27 ENCOUNTER — Ambulatory Visit (INDEPENDENT_AMBULATORY_CARE_PROVIDER_SITE_OTHER): Payer: Medicare Other | Admitting: Physician Assistant

## 2022-02-27 VITALS — BP 97/65 | HR 77 | Ht 62.0 in | Wt 133.0 lb

## 2022-02-27 DIAGNOSIS — F0390 Unspecified dementia without behavioral disturbance: Secondary | ICD-10-CM

## 2022-02-27 DIAGNOSIS — I6523 Occlusion and stenosis of bilateral carotid arteries: Secondary | ICD-10-CM

## 2022-02-27 MED ORDER — POTASSIUM CHLORIDE CRYS ER 10 MEQ PO TBCR: 10.0000 meq | EXTENDED_RELEASE_TABLET | Freq: Every day | ORAL | 3 refills | Status: AC

## 2022-02-27 MED ORDER — RIVASTIGMINE TARTRATE 4.5 MG PO CAPS: 4.5000 mg | ORAL_CAPSULE | Freq: Two times a day (BID) | ORAL | 11 refills | Status: DC

## 2022-02-27 NOTE — Patient Instructions (Addendum)
Good to see you! ? ?1.Continue Rivastigmine to 4.5 mg twice a day ? ?2. Continue with physical exercise, brain stimulation exercises, control of blood pressure, cholesterol, sugar levels ? ?3. Follow-up in 6 months, call for any changes ? ? ?FALL PRECAUTIONS: Be cautious when walking. Scan the area for obstacles that may increase the risk of trips and falls. When getting up in the mornings, sit up at the edge of the bed for a few minutes before getting out of bed. Consider elevating the bed at the head end to avoid drop of blood pressure when getting up. Walk always in a well-lit room (use night lights in the walls). Avoid area rugs or power cords from appliances in the middle of the walkways. Use a walker or a cane if necessary and consider physical therapy for balance exercise. Get your eyesight checked regularly. ? ? ?HOME SAFETY: Consider the safety of the kitchen when operating appliances like stoves, microwave oven, and blender. Consider having supervision and share cooking responsibilities until no longer able to participate in those. Accidents with firearms and other hazards in the house should be identified and addressed as well. ? ? ?ABILITY TO BE LEFT ALONE: If patient is unable to contact 911 operator, consider using LifeLine, or when the need is there, arrange for someone to stay with patients. Smoking is a fire hazard, consider supervision or cessation. Risk of wandering should be assessed by caregiver and if detected at any point, supervision and safe proof recommendations should be instituted. ? ?RECOMMENDATIONS FOR ALL PATIENTS WITH MEMORY PROBLEMS: ?1. Continue to exercise (Recommend 30 minutes of walking everyday, or 3 hours every week) ?2. Increase social interactions - continue going to [place] and enjoy social gatherings with friends and family ?3. Eat healthy, avoid fried foods and eat more fruits and vegetables ?4. Maintain adequate blood pressure, blood sugar, and blood cholesterol level.  Reducing the risk of stroke and cardiovascular disease also helps promoting better memory. ?5. Avoid stressful situations. Live a simple life and avoid aggravations. Organize your time and prepare for the next day in anticipation. ?6. Sleep well, avoid any interruptions of sleep and avoid any distractions in the bedroom that may interfere with adequate sleep quality ?7. Avoid sugar, avoid sweets as there is a strong link between excessive sugar intake, diabetes, and cognitive impairment ?We discussed the Mediterranean diet, which has been shown to help patients reduce the risk of progressive memory disorders and reduces cardiovascular risk. This includes eating fish, eat fruits and green leafy vegetables, nuts like almonds and hazelnuts, walnuts, and also use olive oil. Avoid fast foods and fried foods as much as possible. Avoid sweets and sugar as sugar use has been linked to worsening of memory function. ? ?There is always a concern of gradual progression of memory problems. If this is the case, then we may need to adjust level of care according to patient needs. Support, both to the patient and caregiver, should then be put into place. ? ?

## 2022-02-27 NOTE — Progress Notes (Signed)
? ?Assessment/Plan:  ? ?Dementia likely due to Alzheimer's Disease  ?This is a very pleasant 86 year old woman seen today in follow-up for memory loss.  The patient is on rivastigmine 3 mg twice daily, tolerating well.  MMSE score today is 19, decreased from her prior value 1 year ago (27/30).  Progression of the disease is noted during this visit. ? ? Recommendations:  ?Increase rivastigmine to 4.5 mg twice daily, side effects ?Follow-up in 6 months ? ? ?Case discussed with Dr. Delice Lesch who agrees coordination.  The plan ? ? ? ? ?Subjective:  ? ? ?Felicia Kelly is a very pleasant 86 y.o. RH female  seen today in follow up for memory loss. This patient is accompanied in the office by her Sister Dub Mikes who supplements the history.  Previous records as well as any outside records available were reviewed prior to todays visit.  Patient was last seen at our office on 08/29/21.  Prior MMSE on 01/11/2021 was 27/30 rivastigmine 3 mg twice daily. ?Since her last visit, Dub Mikes reports that her memory "slipped a little, she does not remember as quickly as before, and repeats constantly her questions ".  However, the patient reports "feeling fine, the memory is good".  She lives at [facility], and she is very happy there, constantly doing activities.  She is doing physical therapy for the last 2 months which helps her to increase her mobility.  It also helps her with coordination.  She sleeps well, about 8 or 9 hours at night, with naps during the day, no sleepwalking or REM behavior.  No hallucinations, she does hide stuff from the facility staff, because she is afraid that "still it from her.  This paranoid behavior has been present prior.  Her mood is good, she enjoys [place] greens.  She denies any dizziness, headaches, she ambulates without difficulty, denies pain, appetite is not as good, she is more finicky.  "Sometimes she forgets to eat ".  She denies any dysphagia.  She no longer cooks.  She denies any  diarrhea or constipation.  She wears pads for mild stress incontinence.  No tremors are reported.  She does not wander off, and denies living objects in unusual places.  She does remind herself to leave the key in a small lesion next to the night table without any issues.  She is independent of bathing and dressing, she does not enjoy taking showers, she cannot remember to take one. ?    ?  ? ?" History of Present Illness 02/14/2019:  ?This is an 86 year old right-handed woman with a history of hypertension, hyperlipidemia, hypothyroidism, presenting for evaluation of worsening memory. Memory concerns were raised by family, patient has no insight into her condition and was upset throughout most of the visit (but participated). She states "I forget things, but if you are asking if I have dementia, I don't." She thinks her memory is good. Family started noticing memory changes for several years, worsening recently where she is very forgetful, repeating things constantly. They got 10 Christmas cards from her because she did not remember sending them. Joaquim Lai reports "she has trouble admitting she has issues." She has been living alone for the past 9 years since her husband passed away. She manages finances and medications and denies any difficulties doing these, although Joaquim Lai reports that they had to help her last March because she could not get her taxes together, she seemed to have trouble keeping up with things. She continues to drive  and denies getting lost. Joaquim Lai reports she misplaces things frequently (she denies this). Joaquim Lai has noticed more irritability since they approached her about memory concerns last March, no paranoia or hallucinations. She is independent with dressing and bathing, no hygiene concerns, house is well-kept. No family history of dementia, no history of significant head injuries or alcohol use. ?  ?She denies any headaches, dizziness, diplopia, dysarthria/dysphagia, neck/back pain, focal  numbness/tingling/weakness, bowel/bladder dysfunction, anosmia, or tremors. Sleep is good. She denies any falls."  ?  ?PREVIOUS MEDICATIONS: Donezepil d/c 05/21/2019 ? ?CURRENT MEDICATIONS:  ?Outpatient Encounter Medications as of 02/27/2022  ?Medication Sig  ? aspirin EC 81 MG tablet Take 81 mg by mouth every evening. Swallow whole.  ? Cholecalciferol (VITAMIN D3 PO) Take 1,000 Units by mouth 3 (three) times a week.  ? cyanocobalamin 100 MCG tablet Take by mouth.  ? ezetimibe (ZETIA) 10 MG tablet Take 1 tablet (10 mg total) by mouth daily.  ? levothyroxine (SYNTHROID) 50 MCG tablet 1 tablet six days a week, 2 tablets on sundays  ? losartan-hydrochlorothiazide (HYZAAR) 100-12.5 MG tablet Take 1 tablet by mouth daily.  ? Magnesium 250 MG TABS Take by mouth.  ? MELATONIN PO Take by mouth every evening.  ? metFORMIN (GLUCOPHAGE-XR) 500 MG 24 hr tablet Take 1 tablet (500 mg total) by mouth daily with breakfast.  ? Multiple Vitamin (MULTIVITAMIN) tablet Take 1 tablet by mouth daily.  ? OVER THE COUNTER MEDICATION Occuvite-every morning  ? potassium chloride (KLOR-CON M) 10 MEQ tablet Take 1 tablet (10 mEq total) by mouth daily.  ? pravastatin (PRAVACHOL) 40 MG tablet TAKE 1 TABLET ONCE DAILY.  ? Probiotic Product (PROBIOTIC PO) Take by mouth every evening.  ? valACYclovir (VALTREX) 500 MG tablet TAKE ONE TABLET BY MOUTH ONCE DAILY  ? [DISCONTINUED] rivastigmine (EXELON) 3 MG capsule TAKE ONE CAPSULE BY MOUTH TWICE DAILY.  ? levothyroxine (SYNTHROID) 50 MCG tablet TAKE 1 TABLET ONCE DAILY BEFORE BREAKFAST. (Patient not taking: Reported on 02/27/2022)  ? METAMUCIL FIBER PO Take by mouth every evening.  ? nitrofurantoin, macrocrystal-monohydrate, (MACROBID) 100 MG capsule Take 1 capsule (100 mg total) by mouth 2 (two) times daily. (Patient not taking: Reported on 02/13/2022)  ? rivastigmine (EXELON) 4.5 MG capsule Take 1 capsule (4.5 mg total) by mouth 2 (two) times daily.  ? ?No facility-administered encounter medications on  file as of 02/27/2022.  ? ? ? ?  02/27/2022  ?  1:00 PM 01/11/2021  ?  2:00 PM 05/14/2018  ? 11:41 AM  ?MMSE - Mini Mental State Exam  ?Orientation to time 0 5 5  ?Orientation to Place '5 5 5  '$ ?Registration '3 3 3  '$ ?Attention/ Calculation '4 5 4  '$ ?Recall 0 1 2  ?Language- name 2 objects '2 2 2  '$ ?Language- repeat '1 1 1  '$ ?Language- follow 3 step command '2 3 3  '$ ?Language- read & follow direction '1 1 1  '$ ?Write a sentence '1 1 1  '$ ?Copy design 0 0 1  ?Total score '19 27 28  '$ ? ? ?  02/14/2019  ?  3:00 PM  ?Montreal Cognitive Assessment   ?Visuospatial/ Executive (0/5) 0  ?Naming (0/3) 0  ?Attention: Read list of digits (0/2) 2  ?Attention: Read list of letters (0/1) 0  ?Attention: Serial 7 subtraction starting at 100 (0/3) 2  ?Language: Repeat phrase (0/2) 2  ?Language : Fluency (0/1) 0  ?Abstraction (0/2) 1  ?Delayed Recall (0/5) 5  ?Orientation (0/6) 4  ?Total 16  ?Adjusted Score (  based on education) 17  ? ? ?Objective:  ?  ? ?PHYSICAL EXAMINATION:   ? ?VITALS:   ?Vitals:  ? 02/27/22 1313  ?BP: 97/65  ?Pulse: 77  ?SpO2: 97%  ?Weight: 133 lb (60.3 kg)  ?Height: '5\' 2"'$  (1.575 m)  ? ? ?GEN:  The patient appears stated age and is in NAD. ?HEENT:  Normocephalic, atraumatic.  ? ?Neurological examination: ? ?General: NAD, well-groomed, appears stated age. ?Orientation: The patient is alert. Oriented to person, place and not to date ?Cranial nerves: There is good facial symmetry.The speech is fluent and clear. No aphasia or dysarthria. Fund of knowledge is reduced. Recent and remote memory are impaired. Attention and concentration are reduced.  Able to name objects and repeat phrases.  Hearing is intact to conversational tone.    ?Sensation: Sensation is intact to light touch throughout ?Motor: Strength is at least antigravity x4. ?Tremors: none  ?DTR's 2/4 in UE/LE  ? ?  ?Movement examination: ?Tone: There is normal tone in the UE/LE ?Abnormal movements:  no tremor.  No myoclonus.  No asterixis.   ?Coordination:  There is no decremation  with RAM's. Normal finger to nose  ?Gait and Station: The patient has no difficulty arising out of a deep-seated chair without the use of the hands. The patient's stride length is good.  Gait is cautious a

## 2022-02-28 DIAGNOSIS — M6281 Muscle weakness (generalized): Secondary | ICD-10-CM | POA: Diagnosis not present

## 2022-02-28 DIAGNOSIS — R41841 Cognitive communication deficit: Secondary | ICD-10-CM | POA: Diagnosis not present

## 2022-02-28 DIAGNOSIS — R2681 Unsteadiness on feet: Secondary | ICD-10-CM | POA: Diagnosis not present

## 2022-02-28 DIAGNOSIS — R488 Other symbolic dysfunctions: Secondary | ICD-10-CM | POA: Diagnosis not present

## 2022-02-28 DIAGNOSIS — R2689 Other abnormalities of gait and mobility: Secondary | ICD-10-CM | POA: Diagnosis not present

## 2022-03-01 DIAGNOSIS — R2681 Unsteadiness on feet: Secondary | ICD-10-CM | POA: Diagnosis not present

## 2022-03-01 DIAGNOSIS — R2689 Other abnormalities of gait and mobility: Secondary | ICD-10-CM | POA: Diagnosis not present

## 2022-03-01 DIAGNOSIS — M6281 Muscle weakness (generalized): Secondary | ICD-10-CM | POA: Diagnosis not present

## 2022-03-01 DIAGNOSIS — R41841 Cognitive communication deficit: Secondary | ICD-10-CM | POA: Diagnosis not present

## 2022-03-01 DIAGNOSIS — R488 Other symbolic dysfunctions: Secondary | ICD-10-CM | POA: Diagnosis not present

## 2022-03-02 ENCOUNTER — Telehealth: Payer: Self-pay | Admitting: Family Medicine

## 2022-03-02 NOTE — Telephone Encounter (Signed)
Sister calling requests someone call regarding blood pressure readings 97/65, 94/46. States she is at an independent living facility. Sister concerned as to whether she needs to adjust her medication.  [phone]

## 2022-03-03 ENCOUNTER — Ambulatory Visit
Admission: EM | Admit: 2022-03-03 | Discharge: 2022-03-03 | Disposition: A | Discharge status: Home / Self Care | Payer: Medicare Other | Attending: Emergency Medicine | Admitting: Emergency Medicine

## 2022-03-03 DIAGNOSIS — I959 Hypotension, unspecified: Secondary | ICD-10-CM | POA: Insufficient documentation

## 2022-03-03 DIAGNOSIS — R8281 Pyuria: Secondary | ICD-10-CM | POA: Diagnosis not present

## 2022-03-03 LAB — POCT URINALYSIS DIP (MANUAL ENTRY)
Bilirubin, UA: NEGATIVE
Glucose, UA: NEGATIVE mg/dL
Ketones, POC UA: NEGATIVE mg/dL
Nitrite, UA: NEGATIVE
Protein Ur, POC: NEGATIVE mg/dL
Spec Grav, UA: 1.01 (ref 1.010–1.025)
Urobilinogen, UA: 0.2 E.U./dL
pH, UA: 5.5 (ref 5.0–8.0)

## 2022-03-03 MED ORDER — CEFDINIR 300 MG PO CAPS: 300.0000 mg | ORAL_CAPSULE | Freq: Two times a day (BID) | ORAL | 0 refills | Status: AC

## 2022-03-03 NOTE — Discharge Instructions (Addendum)
Your urine sample today had white blood cells present.  There were no other abnormal findings.  Presence of white blood cells can be concerning for urinary tract infection.  We will perform a urine culture which takes 3 to 5 days to complete.  The result of your urine culture will be posted to your MyChart account and, if it is abnormal, you will be contacted by phone with further recommendations, if any.  Because there are currently white blood cells in your urine and you have been experiencing low blood pressure, I recommend 2 things:  Please discontinue losartan-hydrochlorothiazide until you are seen the provider that manages your blood pressure.  Please begin taking cefdinir for presumed urinary tract infection until the urine culture proves otherwise.  I sent a prescription for a 5-day course of cefdinir to your pharmacy, please take 1 capsule twice daily until complete or until you have been advised to do otherwise.  Thank you for visiting urgent care today.

## 2022-03-03 NOTE — ED Provider Notes (Signed)
[facility]    CSN: [id number] Arrival date & time: 03/03/22  1537    HISTORY   Chief Complaint  Patient presents with   Fatigue   Hypotension   HPI Felicia Kelly is a 86 y.o. female. Patient presents to urgent care with her sister who states that for the past several days, patient has had low blood pressure, reports 81/47 this morning and 114/65 this afternoon.  On arrival to the clinic this early evening, patient had a blood pressure of 84/62.  Patient has baseline dementia, I am unable to evaluate patient for altered mental status however I have seen this patient in the past and per my observation she seems to be at her previous baseline.  EMR reviewed, sister reached out to primary care today afternoon about her low blood pressure and received a call back earlier today advising her that if patient's blood pressure is below 110/60 that she should discontinue her blood pressure medicine, losartan-hydrochlorothiazide.  Sister states that she did give patient her morning dose of losartan-hydrochlorothiazide as usual this morning because she had not heard back from the primary care provider as of yet.  The history is provided by the patient.  Past Medical History:  Diagnosis Date   Aortic atherosclerosis ([place])    a. noted on CT 01/2018.   Arthritis    Colonic polyp    Coronary atherosclerosis    a. noted on CT 01/2018.   Genital herpes    Hyperlipidemia    Hypertension    Mild carotid artery disease ([facility])    Osteoporosis    Overdose of cardiac medication    Pulmonary nodules    a. followed by PCP by serial Ct.   Thyroid disease    seeing Dr. Buddy Duty   Tremor    Patient Active Problem List   Diagnosis Date Noted   SAH (subarachnoid hemorrhage) ([place]) 02/24/2020   Bradycardia 04/01/2019   Cystocele 02/13/2019   Hip pain 02/13/2019   Osteoarthritis of hip 02/13/2019   Subchondral cysts 02/13/2019   Vitamin D deficiency 02/13/2019   Aortic atherosclerosis ([place])  10/14/2017   Solitary pulmonary nodule 10/23/2016   Hyperglycemia 07/11/2016   Hypercholesterolemia 06/04/2013   Hypothyroidism 06/04/2013   Osteopenia 06/27/2012   Malignant basal cell neoplasm of skin 02/14/2012   Benign essential hypertension 11/22/2011   Internal carotid artery stenosis 09/18/2011   Multinodular goiter 10/16/2009   Herpes simplex type 2 infection 10/05/2008   Single renal cyst 02/14/2008   Past Surgical History:  Procedure Laterality Date   ABDOMINAL HYSTERECTOMY  1988   no cancer - reports total with bilat oophorectomy   APPENDECTOMY  1988   BREAST CYST EXCISION Bilateral    BREAST SURGERY  1985   biopsy   FOOT SURGERY     multiple sugeries, remote   TOTAL HIP ARTHROPLASTY Bilateral    OB History   No obstetric history on file.    Home Medications    Prior to Admission medications   Medication Sig Start Date End Date Taking? Authorizing Provider  cefdinir (OMNICEF) 300 MG capsule Take 1 capsule (300 mg total) by mouth 2 (two) times daily for 5 days. 03/03/22 03/08/22 Yes Lynden Oxford Scales, PA-C  aspirin EC 81 MG tablet Take 81 mg by mouth every evening. Swallow whole.    [provider]  Cholecalciferol (VITAMIN D3 PO) Take 1,000 Units by mouth 3 (three) times a week.    [provider]  cyanocobalamin 100 MCG tablet Take  by mouth.    [provider]  ezetimibe (ZETIA) 10 MG tablet Take 1 tablet (10 mg total) by mouth daily. 02/14/22   [name], MD  levothyroxine (SYNTHROID) 50 MCG tablet 1 tablet six days a week, 2 tablets on sundays    [provider]  losartan-hydrochlorothiazide (HYZAAR) 100-12.5 MG tablet Take 1 tablet by mouth daily. 02/16/22   Caren Macadam, MD  Magnesium 250 MG TABS Take by mouth.    [provider]  MELATONIN PO Take by mouth every evening.    [provider]  METAMUCIL FIBER PO Take by mouth every evening.    [provider]  metFORMIN  (GLUCOPHAGE-XR) 500 MG 24 hr tablet Take 1 tablet (500 mg total) by mouth daily with breakfast. 02/16/22   Caren Macadam, MD  Multiple Vitamin (MULTIVITAMIN) tablet Take 1 tablet by mouth daily.    [provider]  OVER THE COUNTER MEDICATION Occuvite-every morning    [provider]  potassium chloride (KLOR-CON M) 10 MEQ tablet Take 1 tablet (10 mEq total) by mouth daily. 02/27/22   [name], MD  pravastatin (PRAVACHOL) 40 MG tablet TAKE 1 TABLET ONCE DAILY. 02/16/22   [name], MD  Probiotic Product (PROBIOTIC PO) Take by mouth every evening.    [provider]  rivastigmine (EXELON) 4.5 MG capsule Take 1 capsule (4.5 mg total) by mouth 2 (two) times daily. 02/27/22   Rondel Jumbo, PA-C  valACYclovir (VALTREX) 500 MG tablet TAKE ONE TABLET BY MOUTH ONCE DAILY 02/16/22   Caren Macadam, MD    Family History Family History  Problem Relation Age of Onset   Pancreatic cancer Father    Heart disease Father    Heart attack Sister 19   Arthritis Mother    Hyperlipidemia Mother    Hypertension Mother    Heart disease Mother    Diabetes Mother    Liver disease Brother        cancer of bile duct   Heart disease Maternal Grandfather    Heart attack Brother    Breast cancer Neg Hx    Social History Social History   Tobacco Use   Smoking status: Former    Types: Cigarettes    Quit date: 10/16/1978    Years since quitting: 43.4   Smokeless tobacco: Never   Tobacco comments:    1962/01/24 when her father died;  smoked for a brief period of time  Vaping Use   Vaping Use: Never used  Substance Use Topics   Alcohol use: No    Alcohol/week: 0.0 standard drinks   Drug use: No   Allergies   Tetracycline, Atorvastatin, Bactrim [sulfamethoxazole-trimethoprim], Celecoxib, Niacin, Niaspan [niacin er], Rosuvastatin, Simvastatin, Tetracyclines & related, and Penicillins  Review of Systems Review of Systems Pertinent findings noted in history  of present illness.   Physical Exam Triage Vital Signs ED Triage Vitals  Enc Vitals Group     BP 08/12/21 0827 (!) 147/82     Pulse Rate 08/12/21 0827 72     Resp 08/12/21 0827 18     Temp 08/12/21 0827 98.3 F (36.8 C)     Temp Source 08/12/21 0827 Oral     SpO2 08/12/21 0827 98 %     Weight --      Height --      Head Circumference --      Peak Flow --      Pain Score 08/12/21 0826 5  Pain Loc --      Pain Edu? --      Excl. in [place]? --   No data found.  Updated Vital Signs BP (!) 84/62 (BP Location: Right Arm)   Pulse 72   Temp 97.6 F (36.4 C) (Oral)   Resp 18   SpO2 96%   Physical Exam Vitals and nursing note reviewed.  Constitutional:      General: She is awake. She is not in acute distress.    Appearance: Normal appearance. She is well-developed and normal weight. She is not ill-appearing, toxic-appearing or diaphoretic.  HENT:     Head: Normocephalic and atraumatic.  Eyes:     General: Lids are normal.        Right eye: No discharge.        Left eye: No discharge.     Extraocular Movements: Extraocular movements intact.     Conjunctiva/sclera: Conjunctivae normal.     Right eye: Right conjunctiva is not injected.     Left eye: Left conjunctiva is not injected.  Neck:     Trachea: Trachea and phonation normal.  Cardiovascular:     Rate and Rhythm: Normal rate and regular rhythm.     Pulses: Normal pulses.     Heart sounds: Normal heart sounds. No murmur heard.   No friction rub. No gallop.  Pulmonary:     Effort: Pulmonary effort is normal. No accessory muscle usage, prolonged expiration or respiratory distress.     Breath sounds: Normal breath sounds. No stridor, decreased air movement or transmitted upper airway sounds. No decreased breath sounds, wheezing, rhonchi or rales.  Chest:     Chest wall: No tenderness.  Abdominal:     General: Abdomen is flat. Bowel sounds are normal.     Palpations: Abdomen is soft.     Tenderness: There is no  abdominal tenderness.  Musculoskeletal:        General: Normal range of motion.     Cervical back: Normal range of motion and neck supple. Normal range of motion.  Lymphadenopathy:     Cervical: No cervical adenopathy.  Skin:    General: Skin is warm and dry.     Findings: No erythema or rash.  Neurological:     General: No focal deficit present.     Mental Status: She is alert. Mental status is at baseline. She is disoriented and confused.     Cranial Nerves: Cranial nerves 2-12 are intact. No cranial nerve deficit.     Sensory: Sensation is intact. No sensory deficit.     Motor: Motor function is intact. No weakness.     Coordination: Coordination normal.     Gait: Gait normal.     Deep Tendon Reflexes: Reflexes are normal and symmetric. Reflexes normal.     Comments: Patient having difficulty with balance which is not new while ambulating from the exam room to the bathroom and back.  Patient required assistance for balance from her sister, patient walked carefully and slowly, patient was not seen to stumble.  Psychiatric:        Attention and Perception: Attention and perception normal.        Mood and Affect: Mood and affect normal.        Speech: Speech normal.        Behavior: Behavior normal. Behavior is cooperative.        Thought Content: Thought content normal.        Cognition and Memory: Cognition is  impaired. Memory is impaired. She exhibits impaired recent memory and impaired remote memory.        Judgment: Judgment normal.    Visual Acuity Right Eye Distance:   Left Eye Distance:   Bilateral Distance:    Right Eye Near:   Left Eye Near:    Bilateral Near:     UC Couse / Diagnostics / Procedures:    EKG  Radiology No results found.  Procedures Procedures (including critical care time)  UC Diagnoses / Final Clinical Impressions(s)   I have reviewed the triage vital signs and the nursing notes.  Pertinent labs & imaging results that were available during  my care of the patient were reviewed by me and considered in my medical decision making (see chart for details).    Final diagnoses:  Pyuria  Hypotension, unspecified hypotension type   Pyuria present on urinalysis today.  We will treat patient empirically for possible urinary tract infection while we await the results of urine culture.  Given multiple allergies to antibiotics, sent cefdinir to the pharmacy which she has taken in the past for UTI and tolerated well.  Sister advised to discontinue losartan-hydrochlorothiazide until patient has been seen by her PCP and further instructions have been provided.  ED Prescriptions     Medication Sig Dispense Auth. Provider   cefdinir (OMNICEF) 300 MG capsule Take 1 capsule (300 mg total) by mouth 2 (two) times daily for 5 days. 10 capsule Lynden Oxford Scales, PA-C      PDMP not reviewed this encounter.  Pending results:  Labs Reviewed  POCT URINALYSIS DIP (MANUAL ENTRY) - Abnormal; Notable for the following components:      Result Value   Blood, UA trace-intact (*)    Leukocytes, UA Small (1+) (*)    All other components within normal limits    Medications Ordered in UC: Medications - No data to display  Disposition Upon Discharge:  Condition: stable for discharge home Home: take medications as prescribed; routine discharge instructions as discussed; follow up as advised.  Patient presented with an acute illness with associated systemic symptoms and significant discomfort requiring urgent management. In my opinion, this is a condition that a prudent lay person (someone who possesses an average knowledge of health and medicine) may potentially expect to result in complications if not addressed urgently such as respiratory distress, impairment of bodily function or dysfunction of bodily organs.   Routine symptom specific, illness specific and/or disease specific instructions were discussed with the patient and/or caregiver at length.    As such, the patient has been evaluated and assessed, work-up was performed and treatment was provided in alignment with urgent care protocols and evidence based medicine.  Patient/parent/caregiver has been advised that the patient may require follow up for further testing and treatment if the symptoms continue in spite of treatment, as clinically indicated and appropriate.  Patient/parent/caregiver has been advised to return to the [facility] or PCP if no better; to PCP or the Emergency Department if new signs and symptoms develop, or if the current signs or symptoms continue to change or worsen for further workup, evaluation and treatment as clinically indicated and appropriate  The patient will follow up with their current PCP if and as advised. If the patient does not currently have a PCP we will assist them in obtaining one.   The patient may need specialty follow up if the symptoms continue, in spite of conservative treatment and management, for further workup, evaluation, consultation and treatment as  clinically indicated and appropriate.   Patient/parent/caregiver verbalized understanding and agreement of plan as discussed.  All questions were addressed during visit.  Please see discharge instructions below for further details of plan.  Discharge Instructions:   Discharge Instructions      Your urine sample today had white blood cells present.  There were no other abnormal findings.  Presence of white blood cells can be concerning for urinary tract infection.  We will perform a urine culture which takes 3 to 5 days to complete.  The result of your urine culture will be posted to your MyChart account and, if it is abnormal, you will be contacted by phone with further recommendations, if any.  Because there are currently white blood cells in your urine and you have been experiencing low blood pressure, I recommend 2 things:  Please discontinue losartan-hydrochlorothiazide until you are seen  the provider that manages your blood pressure.  Please begin taking cefdinir for presumed urinary tract infection until the urine culture proves otherwise.  I sent a prescription for a 5-day course of cefdinir to your pharmacy, please take 1 capsule twice daily until complete or until you have been advised to do otherwise.  Thank you for visiting urgent care today.    This office note has been dictated using Museum/gallery curator.  Unfortunately, and despite my best efforts, this method of dictation can sometimes lead to occasional typographical or grammatical errors.  I apologize in advance if this occurs.     Lynden Oxford Scales, PA-C 03/03/22 1650

## 2022-03-03 NOTE — ED Triage Notes (Addendum)
The patients family member states for several days the patient has been having  hypotension (81/47 this morning, 114/65 this afternoon). The family member states the patient has been more fatigued and she did take her BP medications today.

## 2022-03-03 NOTE — Telephone Encounter (Signed)
Spoke with Mrs Loma Sousa, the patient's sister and informed her of the message below.

## 2022-03-03 NOTE — Telephone Encounter (Signed)
I would suggest cutting the losartan-hctz in half if her pressures are that low. If still running less than 110/65 regularly with half dosing, then just hold medication. Make sure no other sick symptoms as sometimes bp can drop from illness (ie UTI) and if concern for illness then needs evaluation. Advise living facility to check blood pressures at least daily (twice daily if able) and report back numbers next week. Letme know if patient symptomatic - light headed, dizzy, etc.

## 2022-03-05 LAB — URINE CULTURE: Culture: NO GROWTH

## 2022-03-06 DIAGNOSIS — R41841 Cognitive communication deficit: Secondary | ICD-10-CM | POA: Diagnosis not present

## 2022-03-06 DIAGNOSIS — M6281 Muscle weakness (generalized): Secondary | ICD-10-CM | POA: Diagnosis not present

## 2022-03-06 DIAGNOSIS — R2689 Other abnormalities of gait and mobility: Secondary | ICD-10-CM | POA: Diagnosis not present

## 2022-03-06 DIAGNOSIS — R2681 Unsteadiness on feet: Secondary | ICD-10-CM | POA: Diagnosis not present

## 2022-03-06 DIAGNOSIS — R488 Other symbolic dysfunctions: Secondary | ICD-10-CM | POA: Diagnosis not present

## 2022-03-07 ENCOUNTER — Encounter: Payer: Self-pay | Admitting: Internal Medicine

## 2022-03-07 ENCOUNTER — Ambulatory Visit (INDEPENDENT_AMBULATORY_CARE_PROVIDER_SITE_OTHER): Payer: Medicare Other | Admitting: Internal Medicine

## 2022-03-07 VITALS — BP 108/70 | HR 62 | Temp 97.8°F | Wt 141.3 lb

## 2022-03-07 DIAGNOSIS — G301 Alzheimer's disease with late onset: Secondary | ICD-10-CM | POA: Diagnosis not present

## 2022-03-07 DIAGNOSIS — F02B0 Dementia in other diseases classified elsewhere, moderate, without behavioral disturbance, psychotic disturbance, mood disturbance, and anxiety: Secondary | ICD-10-CM | POA: Diagnosis not present

## 2022-03-07 DIAGNOSIS — I959 Hypotension, unspecified: Secondary | ICD-10-CM | POA: Diagnosis not present

## 2022-03-07 DIAGNOSIS — I6523 Occlusion and stenosis of bilateral carotid arteries: Secondary | ICD-10-CM | POA: Diagnosis not present

## 2022-03-07 DIAGNOSIS — N3000 Acute cystitis without hematuria: Secondary | ICD-10-CM

## 2022-03-07 NOTE — Progress Notes (Signed)
Established Patient Office Visit     CC/Reason for Visit: Follow-up blood pressure  HPI: Felicia Kelly is a 86 y.o. female who is coming in today for the above mentioned reasons.  She was seen in urgent care on May 19 with complaints of low blood pressure of 81/47 at home.  Hypotension was confirmed at urgent care.  She was found to have a UTI and was started on cefdinir.  She still has a few days to complete.  She was asked to discontinue her Hyzaar which she has.  Most recent blood pressures at home have been 134/74, 110/63, 110/66, 116/50, 121/60.  She has dementia and so history is aided by her sister Dub Mikes.  She is feeling well and has no concerns.  Past Medical/Surgical History: Past Medical History:  Diagnosis Date   Aortic atherosclerosis ([place])    a. noted on CT 01/2018.   Arthritis    Colonic polyp    Coronary atherosclerosis    a. noted on CT 01/2018.   Genital herpes    Hyperlipidemia    Hypertension    Mild carotid artery disease (HCC)    Osteoporosis    Overdose of cardiac medication    Pulmonary nodules    a. followed by PCP by serial Ct.   Thyroid disease    seeing Dr. Buddy Duty   Tremor     Past Surgical History:  Procedure Laterality Date   ABDOMINAL HYSTERECTOMY  1988   no cancer - reports total with bilat oophorectomy   APPENDECTOMY  1988   BREAST CYST EXCISION Bilateral    BREAST SURGERY  1985   biopsy   FOOT SURGERY     multiple sugeries, remote   TOTAL HIP ARTHROPLASTY Bilateral     Social History:  reports that she quit smoking about 43 years ago. Her smoking use included cigarettes. She has never used smokeless tobacco. She reports that she does not drink alcohol and does not use drugs.  Allergies: Allergies  Allergen Reactions   Tetracycline Itching   Atorvastatin Other (See Comments)    Leg cramps Leg cramps   Bactrim [Sulfamethoxazole-Trimethoprim] Itching   Celecoxib Other (See Comments)    Memory disturbance, slowed  thinking Memory disturbance, slowed thinking   Niacin Other (See Comments)    Caused elevated BP   Niaspan [Niacin Er]     Elevated BP   Rosuvastatin Other (See Comments)    Elevated CPK   Simvastatin Other (See Comments)    Leg cramps Leg cramps   Tetracyclines & Related Itching   Penicillins Swelling and Rash    ALL CILLINS, pt's sister states "swelling" reported is related to the rash she gets     Family History:  Family History  Problem Relation Age of Onset   Pancreatic cancer Father    Heart disease Father    Heart attack Sister 71   Arthritis Mother    Hyperlipidemia Mother    Hypertension Mother    Heart disease Mother    Diabetes Mother    Liver disease Brother        cancer of bile duct   Heart disease Maternal Grandfather    Heart attack Brother    Breast cancer Neg Hx      Current Outpatient Medications:    aspirin EC 81 MG tablet, Take 81 mg by mouth every evening. Swallow whole., Disp: , Rfl:    cefdinir (OMNICEF) 300 MG capsule, Take 1 capsule (300 mg total) by  mouth 2 (two) times daily for 5 days., Disp: 10 capsule, Rfl: 0   Cholecalciferol (VITAMIN D3 PO), Take 1,000 Units by mouth 3 (three) times a week., Disp: , Rfl:    cyanocobalamin 100 MCG tablet, Take by mouth., Disp: , Rfl:    ezetimibe (ZETIA) 10 MG tablet, Take 1 tablet (10 mg total) by mouth daily., Disp: 90 tablet, Rfl: 2   levothyroxine (SYNTHROID) 50 MCG tablet, 1 tablet six days a week, 2 tablets on sundays, Disp: , Rfl:    losartan-hydrochlorothiazide (HYZAAR) 100-12.5 MG tablet, Take 1 tablet by mouth daily., Disp: 90 tablet, Rfl: 1   Magnesium 250 MG TABS, Take by mouth., Disp: , Rfl:    MELATONIN PO, Take by mouth every evening., Disp: , Rfl:    METAMUCIL FIBER PO, Take by mouth every evening., Disp: , Rfl:    metFORMIN (GLUCOPHAGE-XR) 500 MG 24 hr tablet, Take 1 tablet (500 mg total) by mouth daily with breakfast., Disp: 90 tablet, Rfl: 1   Multiple Vitamin (MULTIVITAMIN) tablet, Take  1 tablet by mouth daily., Disp: , Rfl:    OVER THE COUNTER MEDICATION, Occuvite-every morning, Disp: , Rfl:    potassium chloride (KLOR-CON M) 10 MEQ tablet, Take 1 tablet (10 mEq total) by mouth daily., Disp: 30 tablet, Rfl: 3   pravastatin (PRAVACHOL) 40 MG tablet, TAKE 1 TABLET ONCE DAILY., Disp: 90 tablet, Rfl: 3   Probiotic Product (PROBIOTIC PO), Take by mouth every evening., Disp: , Rfl:    rivastigmine (EXELON) 4.5 MG capsule, Take 1 capsule (4.5 mg total) by mouth 2 (two) times daily., Disp: 60 capsule, Rfl: 11   valACYclovir (VALTREX) 500 MG tablet, TAKE ONE TABLET BY MOUTH ONCE DAILY, Disp: 90 tablet, Rfl: 1  Review of Systems:  Constitutional: Denies fever, chills, diaphoresis, appetite change and fatigue.  HEENT: Denies photophobia, eye pain, redness, hearing loss, ear pain, congestion, sore throat, rhinorrhea, sneezing, mouth sores, trouble swallowing, neck pain, neck stiffness and tinnitus.   Respiratory: Denies SOB, DOE, cough, chest tightness,  and wheezing.   Cardiovascular: Denies chest pain, palpitations and leg swelling.  Gastrointestinal: Denies nausea, vomiting, abdominal pain, diarrhea, constipation, blood in stool and abdominal distention.  Genitourinary: Denies dysuria, urgency, frequency, hematuria, flank pain and difficulty urinating.  Endocrine: Denies: hot or cold intolerance, sweats, changes in hair or nails, polyuria, polydipsia. Musculoskeletal: Denies myalgias, back pain, joint swelling, arthralgias and gait problem.  Skin: Denies pallor, rash and wound.  Neurological: Denies dizziness, seizures, syncope, weakness, light-headedness, numbness and headaches.  Hematological: Denies adenopathy. Easy bruising, personal or family bleeding history  Psychiatric/Behavioral: Denies suicidal ideation, mood changes, confusion, nervousness, sleep disturbance and agitation    Physical Exam: Vitals:   03/07/22 1318  BP: 108/70  Pulse: 62  Temp: 97.8 F (36.6 C)   TempSrc: Oral  SpO2: 97%  Weight: 141 lb 4.8 oz (64.1 kg)    Body mass index is 25.84 kg/m.   Constitutional: NAD, calm, comfortable Eyes: PERRL, lids and conjunctivae normal ENMT: Mucous membranes are moist.  Respiratory: clear to auscultation bilaterally, no wheezing, no crackles. Normal respiratory effort. No accessory muscle use.  Cardiovascular: Regular rate and rhythm, no murmurs / rubs / gallops. No extremity edema.   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 -She will complete  her course of cefdinir as prescribed. -She will follow-up as scheduled in office.  Time spent:23 minutes reviewing chart, interviewing and examining patient and formulating plan of care.     Lelon Frohlich, MD [facility]

## 2022-03-08 DIAGNOSIS — R488 Other symbolic dysfunctions: Secondary | ICD-10-CM | POA: Diagnosis not present

## 2022-03-08 DIAGNOSIS — R41841 Cognitive communication deficit: Secondary | ICD-10-CM | POA: Diagnosis not present

## 2022-03-08 DIAGNOSIS — R2681 Unsteadiness on feet: Secondary | ICD-10-CM | POA: Diagnosis not present

## 2022-03-08 DIAGNOSIS — M6281 Muscle weakness (generalized): Secondary | ICD-10-CM | POA: Diagnosis not present

## 2022-03-08 DIAGNOSIS — R2689 Other abnormalities of gait and mobility: Secondary | ICD-10-CM | POA: Diagnosis not present

## 2022-03-09 DIAGNOSIS — R41841 Cognitive communication deficit: Secondary | ICD-10-CM | POA: Diagnosis not present

## 2022-03-09 DIAGNOSIS — R2681 Unsteadiness on feet: Secondary | ICD-10-CM | POA: Diagnosis not present

## 2022-03-09 DIAGNOSIS — M6281 Muscle weakness (generalized): Secondary | ICD-10-CM | POA: Diagnosis not present

## 2022-03-09 DIAGNOSIS — R2689 Other abnormalities of gait and mobility: Secondary | ICD-10-CM | POA: Diagnosis not present

## 2022-03-09 DIAGNOSIS — R488 Other symbolic dysfunctions: Secondary | ICD-10-CM | POA: Diagnosis not present

## 2022-03-10 DIAGNOSIS — R41841 Cognitive communication deficit: Secondary | ICD-10-CM | POA: Diagnosis not present

## 2022-03-10 DIAGNOSIS — R488 Other symbolic dysfunctions: Secondary | ICD-10-CM | POA: Diagnosis not present

## 2022-03-10 DIAGNOSIS — R2681 Unsteadiness on feet: Secondary | ICD-10-CM | POA: Diagnosis not present

## 2022-03-10 DIAGNOSIS — M6281 Muscle weakness (generalized): Secondary | ICD-10-CM | POA: Diagnosis not present

## 2022-03-10 DIAGNOSIS — R2689 Other abnormalities of gait and mobility: Secondary | ICD-10-CM | POA: Diagnosis not present

## 2022-03-13 DIAGNOSIS — R41841 Cognitive communication deficit: Secondary | ICD-10-CM | POA: Diagnosis not present

## 2022-03-13 DIAGNOSIS — R488 Other symbolic dysfunctions: Secondary | ICD-10-CM | POA: Diagnosis not present

## 2022-03-13 DIAGNOSIS — M6281 Muscle weakness (generalized): Secondary | ICD-10-CM | POA: Diagnosis not present

## 2022-03-13 DIAGNOSIS — R2689 Other abnormalities of gait and mobility: Secondary | ICD-10-CM | POA: Diagnosis not present

## 2022-03-13 DIAGNOSIS — R2681 Unsteadiness on feet: Secondary | ICD-10-CM | POA: Diagnosis not present

## 2022-03-14 DIAGNOSIS — M6281 Muscle weakness (generalized): Secondary | ICD-10-CM | POA: Diagnosis not present

## 2022-03-14 DIAGNOSIS — R488 Other symbolic dysfunctions: Secondary | ICD-10-CM | POA: Diagnosis not present

## 2022-03-14 DIAGNOSIS — R2681 Unsteadiness on feet: Secondary | ICD-10-CM | POA: Diagnosis not present

## 2022-03-14 DIAGNOSIS — R41841 Cognitive communication deficit: Secondary | ICD-10-CM | POA: Diagnosis not present

## 2022-03-14 DIAGNOSIS — R2689 Other abnormalities of gait and mobility: Secondary | ICD-10-CM | POA: Diagnosis not present

## 2022-03-15 DIAGNOSIS — R488 Other symbolic dysfunctions: Secondary | ICD-10-CM | POA: Diagnosis not present

## 2022-03-15 DIAGNOSIS — M6281 Muscle weakness (generalized): Secondary | ICD-10-CM | POA: Diagnosis not present

## 2022-03-15 DIAGNOSIS — R41841 Cognitive communication deficit: Secondary | ICD-10-CM | POA: Diagnosis not present

## 2022-03-15 DIAGNOSIS — R2681 Unsteadiness on feet: Secondary | ICD-10-CM | POA: Diagnosis not present

## 2022-03-15 DIAGNOSIS — R2689 Other abnormalities of gait and mobility: Secondary | ICD-10-CM | POA: Diagnosis not present

## 2022-03-16 DIAGNOSIS — R2681 Unsteadiness on feet: Secondary | ICD-10-CM | POA: Diagnosis not present

## 2022-03-16 DIAGNOSIS — R2689 Other abnormalities of gait and mobility: Secondary | ICD-10-CM | POA: Diagnosis not present

## 2022-03-16 DIAGNOSIS — M6281 Muscle weakness (generalized): Secondary | ICD-10-CM | POA: Diagnosis not present

## 2022-03-16 DIAGNOSIS — R41841 Cognitive communication deficit: Secondary | ICD-10-CM | POA: Diagnosis not present

## 2022-03-16 DIAGNOSIS — R488 Other symbolic dysfunctions: Secondary | ICD-10-CM | POA: Diagnosis not present

## 2022-03-17 DIAGNOSIS — R2689 Other abnormalities of gait and mobility: Secondary | ICD-10-CM | POA: Diagnosis not present

## 2022-03-17 DIAGNOSIS — R2681 Unsteadiness on feet: Secondary | ICD-10-CM | POA: Diagnosis not present

## 2022-03-17 DIAGNOSIS — R41841 Cognitive communication deficit: Secondary | ICD-10-CM | POA: Diagnosis not present

## 2022-03-17 DIAGNOSIS — M6281 Muscle weakness (generalized): Secondary | ICD-10-CM | POA: Diagnosis not present

## 2022-03-17 DIAGNOSIS — R488 Other symbolic dysfunctions: Secondary | ICD-10-CM | POA: Diagnosis not present

## 2022-03-20 DIAGNOSIS — R41841 Cognitive communication deficit: Secondary | ICD-10-CM | POA: Diagnosis not present

## 2022-03-20 DIAGNOSIS — M6281 Muscle weakness (generalized): Secondary | ICD-10-CM | POA: Diagnosis not present

## 2022-03-20 DIAGNOSIS — R2681 Unsteadiness on feet: Secondary | ICD-10-CM | POA: Diagnosis not present

## 2022-03-20 DIAGNOSIS — R488 Other symbolic dysfunctions: Secondary | ICD-10-CM | POA: Diagnosis not present

## 2022-03-20 DIAGNOSIS — R2689 Other abnormalities of gait and mobility: Secondary | ICD-10-CM | POA: Diagnosis not present

## 2022-03-21 DIAGNOSIS — M6281 Muscle weakness (generalized): Secondary | ICD-10-CM | POA: Diagnosis not present

## 2022-03-21 DIAGNOSIS — R2689 Other abnormalities of gait and mobility: Secondary | ICD-10-CM | POA: Diagnosis not present

## 2022-03-21 DIAGNOSIS — R488 Other symbolic dysfunctions: Secondary | ICD-10-CM | POA: Diagnosis not present

## 2022-03-21 DIAGNOSIS — R41841 Cognitive communication deficit: Secondary | ICD-10-CM | POA: Diagnosis not present

## 2022-03-21 DIAGNOSIS — R2681 Unsteadiness on feet: Secondary | ICD-10-CM | POA: Diagnosis not present

## 2022-03-22 DIAGNOSIS — R2681 Unsteadiness on feet: Secondary | ICD-10-CM | POA: Diagnosis not present

## 2022-03-22 DIAGNOSIS — R488 Other symbolic dysfunctions: Secondary | ICD-10-CM | POA: Diagnosis not present

## 2022-03-22 DIAGNOSIS — R2689 Other abnormalities of gait and mobility: Secondary | ICD-10-CM | POA: Diagnosis not present

## 2022-03-22 DIAGNOSIS — M6281 Muscle weakness (generalized): Secondary | ICD-10-CM | POA: Diagnosis not present

## 2022-03-22 DIAGNOSIS — R41841 Cognitive communication deficit: Secondary | ICD-10-CM | POA: Diagnosis not present

## 2022-03-23 DIAGNOSIS — R2689 Other abnormalities of gait and mobility: Secondary | ICD-10-CM | POA: Diagnosis not present

## 2022-03-23 DIAGNOSIS — R41841 Cognitive communication deficit: Secondary | ICD-10-CM | POA: Diagnosis not present

## 2022-03-23 DIAGNOSIS — M6281 Muscle weakness (generalized): Secondary | ICD-10-CM | POA: Diagnosis not present

## 2022-03-23 DIAGNOSIS — R2681 Unsteadiness on feet: Secondary | ICD-10-CM | POA: Diagnosis not present

## 2022-03-23 DIAGNOSIS — R488 Other symbolic dysfunctions: Secondary | ICD-10-CM | POA: Diagnosis not present

## 2022-03-24 DIAGNOSIS — R488 Other symbolic dysfunctions: Secondary | ICD-10-CM | POA: Diagnosis not present

## 2022-03-24 DIAGNOSIS — R2689 Other abnormalities of gait and mobility: Secondary | ICD-10-CM | POA: Diagnosis not present

## 2022-03-24 DIAGNOSIS — M6281 Muscle weakness (generalized): Secondary | ICD-10-CM | POA: Diagnosis not present

## 2022-03-24 DIAGNOSIS — R41841 Cognitive communication deficit: Secondary | ICD-10-CM | POA: Diagnosis not present

## 2022-03-24 DIAGNOSIS — R2681 Unsteadiness on feet: Secondary | ICD-10-CM | POA: Diagnosis not present

## 2022-03-28 DIAGNOSIS — R41841 Cognitive communication deficit: Secondary | ICD-10-CM | POA: Diagnosis not present

## 2022-03-28 DIAGNOSIS — R488 Other symbolic dysfunctions: Secondary | ICD-10-CM | POA: Diagnosis not present

## 2022-03-28 DIAGNOSIS — R2689 Other abnormalities of gait and mobility: Secondary | ICD-10-CM | POA: Diagnosis not present

## 2022-03-28 DIAGNOSIS — R2681 Unsteadiness on feet: Secondary | ICD-10-CM | POA: Diagnosis not present

## 2022-03-28 DIAGNOSIS — M6281 Muscle weakness (generalized): Secondary | ICD-10-CM | POA: Diagnosis not present

## 2022-03-29 ENCOUNTER — Telehealth: Payer: Self-pay | Admitting: Family Medicine

## 2022-03-29 DIAGNOSIS — R2689 Other abnormalities of gait and mobility: Secondary | ICD-10-CM | POA: Diagnosis not present

## 2022-03-29 DIAGNOSIS — M6281 Muscle weakness (generalized): Secondary | ICD-10-CM | POA: Diagnosis not present

## 2022-03-29 DIAGNOSIS — R41841 Cognitive communication deficit: Secondary | ICD-10-CM | POA: Diagnosis not present

## 2022-03-29 DIAGNOSIS — R2681 Unsteadiness on feet: Secondary | ICD-10-CM | POA: Diagnosis not present

## 2022-03-29 DIAGNOSIS — R488 Other symbolic dysfunctions: Secondary | ICD-10-CM | POA: Diagnosis not present

## 2022-03-29 NOTE — Telephone Encounter (Signed)
Spoke with Mrs Loma Sousa, the patient's sister and informed her of the message below.

## 2022-03-29 NOTE — Telephone Encounter (Signed)
Patient's sister Gardiner Ramus dropped off BP readings.  She is requesting a call back-- [phone]

## 2022-03-29 NOTE — Telephone Encounter (Signed)
---  Caller states her sister was taken off her blood pressure medicine, her blood pressure is 140/63 yesterday. Caller states sister is in facility for memory problems - sister has been journaling her BP.  03/29/2022 11:32:49 AM Clinical Call Yes Doyle Askew, RN, Beth  Comments User: Melene Muller, RN Date/Time Eilene Ghazi Time): 03/29/2022 11:32:46 AM Caller is going to take BP journal to PCP to let PCP look over  Referrals REFERRED TO PCP OFFICE

## 2022-03-30 DIAGNOSIS — M6281 Muscle weakness (generalized): Secondary | ICD-10-CM | POA: Diagnosis not present

## 2022-03-30 DIAGNOSIS — R2689 Other abnormalities of gait and mobility: Secondary | ICD-10-CM | POA: Diagnosis not present

## 2022-03-30 DIAGNOSIS — R2681 Unsteadiness on feet: Secondary | ICD-10-CM | POA: Diagnosis not present

## 2022-03-30 DIAGNOSIS — R41841 Cognitive communication deficit: Secondary | ICD-10-CM | POA: Diagnosis not present

## 2022-03-30 DIAGNOSIS — R488 Other symbolic dysfunctions: Secondary | ICD-10-CM | POA: Diagnosis not present

## 2022-03-31 DIAGNOSIS — M6281 Muscle weakness (generalized): Secondary | ICD-10-CM | POA: Diagnosis not present

## 2022-03-31 DIAGNOSIS — R2681 Unsteadiness on feet: Secondary | ICD-10-CM | POA: Diagnosis not present

## 2022-03-31 DIAGNOSIS — R488 Other symbolic dysfunctions: Secondary | ICD-10-CM | POA: Diagnosis not present

## 2022-03-31 DIAGNOSIS — R41841 Cognitive communication deficit: Secondary | ICD-10-CM | POA: Diagnosis not present

## 2022-03-31 DIAGNOSIS — R2689 Other abnormalities of gait and mobility: Secondary | ICD-10-CM | POA: Diagnosis not present

## 2022-04-04 DIAGNOSIS — R2689 Other abnormalities of gait and mobility: Secondary | ICD-10-CM | POA: Diagnosis not present

## 2022-04-04 DIAGNOSIS — M6281 Muscle weakness (generalized): Secondary | ICD-10-CM | POA: Diagnosis not present

## 2022-04-04 DIAGNOSIS — R2681 Unsteadiness on feet: Secondary | ICD-10-CM | POA: Diagnosis not present

## 2022-04-04 DIAGNOSIS — R488 Other symbolic dysfunctions: Secondary | ICD-10-CM | POA: Diagnosis not present

## 2022-04-04 DIAGNOSIS — R41841 Cognitive communication deficit: Secondary | ICD-10-CM | POA: Diagnosis not present

## 2022-04-05 DIAGNOSIS — R2681 Unsteadiness on feet: Secondary | ICD-10-CM | POA: Diagnosis not present

## 2022-04-05 DIAGNOSIS — M6281 Muscle weakness (generalized): Secondary | ICD-10-CM | POA: Diagnosis not present

## 2022-04-05 DIAGNOSIS — R41841 Cognitive communication deficit: Secondary | ICD-10-CM | POA: Diagnosis not present

## 2022-04-05 DIAGNOSIS — R2689 Other abnormalities of gait and mobility: Secondary | ICD-10-CM | POA: Diagnosis not present

## 2022-04-05 DIAGNOSIS — R488 Other symbolic dysfunctions: Secondary | ICD-10-CM | POA: Diagnosis not present

## 2022-04-06 DIAGNOSIS — R41841 Cognitive communication deficit: Secondary | ICD-10-CM | POA: Diagnosis not present

## 2022-04-06 DIAGNOSIS — R2681 Unsteadiness on feet: Secondary | ICD-10-CM | POA: Diagnosis not present

## 2022-04-06 DIAGNOSIS — M6281 Muscle weakness (generalized): Secondary | ICD-10-CM | POA: Diagnosis not present

## 2022-04-06 DIAGNOSIS — R488 Other symbolic dysfunctions: Secondary | ICD-10-CM | POA: Diagnosis not present

## 2022-04-06 DIAGNOSIS — R2689 Other abnormalities of gait and mobility: Secondary | ICD-10-CM | POA: Diagnosis not present

## 2022-04-07 ENCOUNTER — Telehealth: Payer: Self-pay | Admitting: Family Medicine

## 2022-04-07 DIAGNOSIS — M6281 Muscle weakness (generalized): Secondary | ICD-10-CM | POA: Diagnosis not present

## 2022-04-07 DIAGNOSIS — R2681 Unsteadiness on feet: Secondary | ICD-10-CM | POA: Diagnosis not present

## 2022-04-07 DIAGNOSIS — R2689 Other abnormalities of gait and mobility: Secondary | ICD-10-CM | POA: Diagnosis not present

## 2022-04-07 DIAGNOSIS — R41841 Cognitive communication deficit: Secondary | ICD-10-CM | POA: Diagnosis not present

## 2022-04-07 DIAGNOSIS — R488 Other symbolic dysfunctions: Secondary | ICD-10-CM | POA: Diagnosis not present

## 2022-04-08 ENCOUNTER — Other Ambulatory Visit: Payer: Self-pay | Admitting: Family

## 2022-04-08 MED ORDER — LOSARTAN POTASSIUM-HCTZ 100-25 MG PO TABS
1.0000 | ORAL_TABLET | Freq: Every day | ORAL | 3 refills | Status: DC
Start: 2022-04-08 — End: 2022-05-16

## 2022-04-10 DIAGNOSIS — R2681 Unsteadiness on feet: Secondary | ICD-10-CM | POA: Diagnosis not present

## 2022-04-10 DIAGNOSIS — R41841 Cognitive communication deficit: Secondary | ICD-10-CM | POA: Diagnosis not present

## 2022-04-10 DIAGNOSIS — M6281 Muscle weakness (generalized): Secondary | ICD-10-CM | POA: Diagnosis not present

## 2022-04-10 DIAGNOSIS — R488 Other symbolic dysfunctions: Secondary | ICD-10-CM | POA: Diagnosis not present

## 2022-04-10 DIAGNOSIS — R2689 Other abnormalities of gait and mobility: Secondary | ICD-10-CM | POA: Diagnosis not present

## 2022-04-11 DIAGNOSIS — R41841 Cognitive communication deficit: Secondary | ICD-10-CM | POA: Diagnosis not present

## 2022-04-11 DIAGNOSIS — R2681 Unsteadiness on feet: Secondary | ICD-10-CM | POA: Diagnosis not present

## 2022-04-11 DIAGNOSIS — M6281 Muscle weakness (generalized): Secondary | ICD-10-CM | POA: Diagnosis not present

## 2022-04-11 DIAGNOSIS — R488 Other symbolic dysfunctions: Secondary | ICD-10-CM | POA: Diagnosis not present

## 2022-04-11 DIAGNOSIS — R2689 Other abnormalities of gait and mobility: Secondary | ICD-10-CM | POA: Diagnosis not present

## 2022-04-12 DIAGNOSIS — R2689 Other abnormalities of gait and mobility: Secondary | ICD-10-CM | POA: Diagnosis not present

## 2022-04-12 DIAGNOSIS — M6281 Muscle weakness (generalized): Secondary | ICD-10-CM | POA: Diagnosis not present

## 2022-04-12 DIAGNOSIS — R2681 Unsteadiness on feet: Secondary | ICD-10-CM | POA: Diagnosis not present

## 2022-04-12 DIAGNOSIS — R41841 Cognitive communication deficit: Secondary | ICD-10-CM | POA: Diagnosis not present

## 2022-04-12 DIAGNOSIS — R488 Other symbolic dysfunctions: Secondary | ICD-10-CM | POA: Diagnosis not present

## 2022-04-13 DIAGNOSIS — R2689 Other abnormalities of gait and mobility: Secondary | ICD-10-CM | POA: Diagnosis not present

## 2022-04-13 DIAGNOSIS — R2681 Unsteadiness on feet: Secondary | ICD-10-CM | POA: Diagnosis not present

## 2022-04-13 DIAGNOSIS — R41841 Cognitive communication deficit: Secondary | ICD-10-CM | POA: Diagnosis not present

## 2022-04-13 DIAGNOSIS — M6281 Muscle weakness (generalized): Secondary | ICD-10-CM | POA: Diagnosis not present

## 2022-04-13 DIAGNOSIS — R488 Other symbolic dysfunctions: Secondary | ICD-10-CM | POA: Diagnosis not present

## 2022-04-14 DIAGNOSIS — R488 Other symbolic dysfunctions: Secondary | ICD-10-CM | POA: Diagnosis not present

## 2022-04-14 DIAGNOSIS — R2689 Other abnormalities of gait and mobility: Secondary | ICD-10-CM | POA: Diagnosis not present

## 2022-04-14 DIAGNOSIS — R41841 Cognitive communication deficit: Secondary | ICD-10-CM | POA: Diagnosis not present

## 2022-04-14 DIAGNOSIS — M6281 Muscle weakness (generalized): Secondary | ICD-10-CM | POA: Diagnosis not present

## 2022-04-14 DIAGNOSIS — R2681 Unsteadiness on feet: Secondary | ICD-10-CM | POA: Diagnosis not present

## 2022-04-16 ENCOUNTER — Emergency Department (HOSPITAL_COMMUNITY)
Admission: EM | Admit: 2022-04-16 | Discharge: 2022-04-16 | Disposition: A | Discharge status: Home / Self Care | Payer: Medicare Other | Attending: Emergency Medicine | Admitting: Emergency Medicine

## 2022-04-16 ENCOUNTER — Other Ambulatory Visit: Payer: Self-pay

## 2022-04-16 ENCOUNTER — Emergency Department (HOSPITAL_COMMUNITY): Payer: Medicare Other

## 2022-04-16 ENCOUNTER — Encounter (HOSPITAL_COMMUNITY): Payer: Self-pay | Admitting: Emergency Medicine

## 2022-04-16 DIAGNOSIS — R531 Weakness: Secondary | ICD-10-CM | POA: Diagnosis not present

## 2022-04-16 DIAGNOSIS — F0390 Unspecified dementia without behavioral disturbance: Secondary | ICD-10-CM | POA: Insufficient documentation

## 2022-04-16 DIAGNOSIS — R55 Syncope and collapse: Secondary | ICD-10-CM | POA: Diagnosis not present

## 2022-04-16 DIAGNOSIS — S0093XA Contusion of unspecified part of head, initial encounter: Secondary | ICD-10-CM

## 2022-04-16 DIAGNOSIS — S0990XA Unspecified injury of head, initial encounter: Secondary | ICD-10-CM | POA: Diagnosis not present

## 2022-04-16 DIAGNOSIS — W01198A Fall on same level from slipping, tripping and stumbling with subsequent striking against other object, initial encounter: Secondary | ICD-10-CM | POA: Diagnosis not present

## 2022-04-16 DIAGNOSIS — N390 Urinary tract infection, site not specified: Secondary | ICD-10-CM | POA: Diagnosis not present

## 2022-04-16 DIAGNOSIS — S0083XA Contusion of other part of head, initial encounter: Secondary | ICD-10-CM | POA: Insufficient documentation

## 2022-04-16 DIAGNOSIS — W19XXXA Unspecified fall, initial encounter: Secondary | ICD-10-CM | POA: Diagnosis not present

## 2022-04-16 DIAGNOSIS — I959 Hypotension, unspecified: Secondary | ICD-10-CM

## 2022-04-16 DIAGNOSIS — Z7982 Long term (current) use of aspirin: Secondary | ICD-10-CM | POA: Diagnosis not present

## 2022-04-16 LAB — URINALYSIS, ROUTINE W REFLEX MICROSCOPIC
Bilirubin Urine: NEGATIVE
Glucose, UA: NEGATIVE mg/dL
Ketones, ur: NEGATIVE mg/dL
Nitrite: NEGATIVE
Protein, ur: NEGATIVE mg/dL
Specific Gravity, Urine: 1.006 (ref 1.005–1.030)
pH: 6 (ref 5.0–8.0)

## 2022-04-16 LAB — CBC
HCT: 40.5 % (ref 36.0–46.0)
Hemoglobin: 12.9 g/dL (ref 12.0–15.0)
MCH: 29.8 pg (ref 26.0–34.0)
MCHC: 31.9 g/dL (ref 30.0–36.0)
MCV: 93.5 fL (ref 80.0–100.0)
Platelets: 284 10*3/uL (ref 150–400)
RBC: 4.33 MIL/uL (ref 3.87–5.11)
RDW: 13.2 % (ref 11.5–15.5)
WBC: 11 10*3/uL — ABNORMAL HIGH (ref 4.0–10.5)
nRBC: 0 % (ref 0.0–0.2)

## 2022-04-16 LAB — BASIC METABOLIC PANEL
Anion gap: 16 — ABNORMAL HIGH (ref 5–15)
BUN: 21 mg/dL (ref 8–23)
CO2: 23 mmol/L (ref 22–32)
Calcium: 9.8 mg/dL (ref 8.9–10.3)
Chloride: 100 mmol/L (ref 98–111)
Creatinine, Ser: 1.17 mg/dL — ABNORMAL HIGH (ref 0.44–1.00)
GFR, Estimated: 46 mL/min — ABNORMAL LOW (ref >60)
Glucose, Bld: 123 mg/dL — ABNORMAL HIGH (ref 70–99)
Potassium: 3.5 mmol/L (ref 3.5–5.1)
Sodium: 139 mmol/L (ref 135–145)

## 2022-04-16 MED ORDER — CEPHALEXIN 500 MG PO CAPS: 500.0000 mg | ORAL_CAPSULE | Freq: Three times a day (TID) | ORAL | 0 refills | Status: DC

## 2022-04-16 MED ORDER — SODIUM CHLORIDE 0.9 % IV SOLN
1.0000 g | Freq: Once | INTRAVENOUS | Status: AC
  Administered 2022-04-16: 1 g via INTRAVENOUS
  Filled 2022-04-16: qty 10

## 2022-04-16 MED ORDER — ACETAMINOPHEN 500 MG PO TABS
1000.0000 mg | ORAL_TABLET | Freq: Once | ORAL | Status: AC
  Administered 2022-04-16: 1000 mg via ORAL
  Filled 2022-04-16: qty 2

## 2022-04-16 MED ORDER — SODIUM CHLORIDE 0.9 % IV BOLUS
1000.0000 mL | Freq: Once | INTRAVENOUS | Status: AC
  Administered 2022-04-16: 1000 mL via INTRAVENOUS

## 2022-04-16 NOTE — ED Provider Notes (Signed)
[facility] EMERGENCY DEPARTMENT Provider Note   CSN: [id number] Arrival date & time: 04/16/22  1754     History  Chief Complaint  Patient presents with   Loss of Consciousness    Felicia Kelly is a 86 y.o. female.  Pt with hx mild dementia, from ECF via EMS where patient had a fall. ?syncope vs mechanical - pt does not recall what caused fall. Contusion to forehead, pain to area - otherwise pt denies pain or injury, states felt fine today. Denies severe headache. No neck pain or stiffness. No chest pain or discomfort. No sob. No palpitations. No abd pain or nv. Denies blood loss, rectal bleeding or melena. No dysuria or gu c/o. No fever or chills. Pts mental status described as c/w baseline post fall. Pt normally walks w cane.   The history is provided by the patient, medical records and the EMS personnel. The history is limited by the condition of the patient.  Loss of Consciousness Associated symptoms: no chest pain, no fever, no headaches, no nausea, no shortness of breath, no vomiting and no weakness        Home Medications Prior to Admission medications   Medication Sig Start Date End Date Taking? Authorizing Provider  aspirin EC 81 MG tablet Take 81 mg by mouth every evening. Swallow whole.    [provider]  Cholecalciferol (VITAMIN D3 PO) Take 1,000 Units by mouth 3 (three) times a week.    [provider]  cyanocobalamin 100 MCG tablet Take by mouth.    [provider]  ezetimibe (ZETIA) 10 MG tablet Take 1 tablet (10 mg total) by mouth daily. 02/14/22   [name], MD  levothyroxine (SYNTHROID) 50 MCG tablet 1 tablet six days a week, 2 tablets on sundays    [provider]  losartan-hydrochlorothiazide (HYZAAR) 100-25 MG tablet Take 1 tablet by mouth daily. 04/08/22   Dutch Quint B, FNP  Magnesium 250 MG TABS Take by mouth.    [provider]  MELATONIN PO Take by mouth every evening.     [provider]  METAMUCIL FIBER PO Take by mouth every evening.    [provider]  metFORMIN (GLUCOPHAGE-XR) 500 MG 24 hr tablet Take 1 tablet (500 mg total) by mouth daily with breakfast. 02/16/22   Caren Macadam, MD  Multiple Vitamin (MULTIVITAMIN) tablet Take 1 tablet by mouth daily.    [provider]  OVER THE COUNTER MEDICATION Occuvite-every morning    [provider]  potassium chloride (KLOR-CON M) 10 MEQ tablet Take 1 tablet (10 mEq total) by mouth daily. 02/27/22   [name], MD  pravastatin (PRAVACHOL) 40 MG tablet TAKE 1 TABLET ONCE DAILY. 02/16/22   [name], MD  Probiotic Product (PROBIOTIC PO) Take by mouth every evening.    [provider]  rivastigmine (EXELON) 4.5 MG capsule Take 1 capsule (4.5 mg total) by mouth 2 (two) times daily. 02/27/22   Rondel Jumbo, PA-C  valACYclovir (VALTREX) 500 MG tablet TAKE ONE TABLET BY MOUTH ONCE DAILY 02/16/22   Caren Macadam, MD      Allergies    Tetracycline, Atorvastatin, Bactrim [sulfamethoxazole-trimethoprim], Celecoxib, Niacin, Niaspan [niacin er], Rosuvastatin, Simvastatin, Tetracyclines & related, and Penicillins    Review of Systems   Review of Systems  Constitutional:  Negative for chills and fever.  HENT:  Negative for nosebleeds.   Eyes:  Negative for pain, redness and visual disturbance.  Respiratory:  Negative for shortness of breath.   Cardiovascular:  Positive for syncope. Negative for chest pain.  Gastrointestinal:  Negative for abdominal pain, blood in stool, nausea and vomiting.  Genitourinary:  Negative for dysuria and flank pain.  Musculoskeletal:  Negative for back pain and neck pain.  Skin:  Negative for wound.  Neurological:  Negative for speech difficulty, weakness, numbness and headaches.  Hematological:  Does not bruise/bleed easily.    Physical Exam Updated Vital Signs Ht 1.575 m ('5\' 2"'$ )   Wt 64.1 kg   BMI 25.85 kg/m   Physical Exam Vitals and nursing note reviewed.  Constitutional:      Appearance: Normal appearance. She is well-developed.  HENT:     Head:     Comments: Contusion right forehead. Pts orbits/facial bones appear grossly intact.     Nose: Nose normal.     Mouth/Throat:     Mouth: Mucous membranes are moist.  Eyes:     General: No scleral icterus.    Conjunctiva/sclera: Conjunctivae normal.     Pupils: Pupils are equal, round, and reactive to light.  Neck:     Vascular: No carotid bruit.     Trachea: No tracheal deviation.  Cardiovascular:     Rate and Rhythm: Normal rate and regular rhythm.     Pulses: Normal pulses.     Heart sounds: Normal heart sounds. No murmur heard.    No friction rub. No gallop.  Pulmonary:     Effort: Pulmonary effort is normal. No respiratory distress.     Breath sounds: Normal breath sounds.  Abdominal:     General: Bowel sounds are normal. There is no distension.     Palpations: Abdomen is soft.     Tenderness: There is no abdominal tenderness.     Comments: No abd pain, bruising or contusion noted.   Genitourinary:    Comments: No cva tenderness.  Musculoskeletal:        General: No swelling.     Cervical back: Normal range of motion and neck supple. No rigidity or tenderness. No muscular tenderness.     Comments: CTLS spine, non tender, aligned, no step off. Good rom bil extremities without pain or focal bony tenderness.   Skin:    General: Skin is warm and dry.     Findings: No rash.  Neurological:     Mental Status: She is alert.     Comments: Alert, speech normal. GCS 15. Oriented. Motor/sens grossly intact bil.   Psychiatric:        Mood and Affect: Mood normal.     ED Results / Procedures / Treatments   Labs (all labs ordered are listed, but only abnormal results are displayed) Results for orders placed or performed during the hospital encounter of 44/31/54  Basic metabolic panel  Result Value Ref Range   Sodium 139 135 - 145  mmol/L   Potassium 3.5 3.5 - 5.1 mmol/L   Chloride 100 98 - 111 mmol/L   CO2 23 22 - 32 mmol/L   Glucose, Bld 123 (H) 70 - 99 mg/dL   BUN 21 8 - 23 mg/dL   Creatinine, Ser 1.17 (H) 0.44 - 1.00 mg/dL   Calcium 9.8 8.9 - 10.3 mg/dL   GFR, Estimated 46 (L) >60 mL/min   Anion gap 16 (H) 5 - 15  CBC  Result Value Ref Range   WBC 11.0 (H) 4.0 - 10.5 K/uL   RBC 4.33 3.87 - 5.11 MIL/uL   Hemoglobin 12.9 12.0 -  15.0 g/dL   HCT 40.5 36.0 - 46.0 %   MCV 93.5 80.0 - 100.0 fL   MCH 29.8 26.0 - 34.0 pg   MCHC 31.9 30.0 - 36.0 g/dL   RDW 13.2 11.5 - 15.5 %   Platelets 284 150 - 400 K/uL   nRBC 0.0 0.0 - 0.2 %  Urinalysis, Routine w reflex microscopic Urine, Clean Catch  Result Value Ref Range   Color, Urine YELLOW YELLOW   APPearance CLEAR CLEAR   Specific Gravity, Urine 1.006 1.005 - 1.030   pH 6.0 5.0 - 8.0   Glucose, UA NEGATIVE NEGATIVE mg/dL   Hgb urine dipstick SMALL (A) NEGATIVE   Bilirubin Urine NEGATIVE NEGATIVE   Ketones, ur NEGATIVE NEGATIVE mg/dL   Protein, ur NEGATIVE NEGATIVE mg/dL   Nitrite NEGATIVE NEGATIVE   Leukocytes,Ua LARGE (A) NEGATIVE   RBC / HPF 0-5 0 - 5 RBC/hpf   WBC, UA 21-50 0 - 5 WBC/hpf   Bacteria, UA FEW (A) NONE SEEN   Squamous Epithelial / LPF 0-5 0 - 5   Mucus PRESENT    CT Head Wo Contrast  Result Date: 04/16/2022 CLINICAL DATA:  Head trauma, minor (Age >= 65y). Syncopal episode, pt felt faint and fell from standing position. Bystanders state pt hit head. Hx of dementia. EXAM: CT HEAD WITHOUT CONTRAST TECHNIQUE: Contiguous axial images were obtained from the base of the skull through the vertex without intravenous contrast. RADIATION DOSE REDUCTION: This exam was performed according to the departmental dose-optimization program which includes automated exposure control, adjustment of the mA and/or kV according to patient size and/or use of iterative reconstruction technique. COMPARISON:  CT head 02/19/2020 BRAIN: BRAIN Patchy and confluent areas of  decreased attenuation are noted throughout the deep and periventricular white matter of the cerebral hemispheres bilaterally, compatible with chronic microvascular ischemic disease. No evidence of large-territorial acute infarction. No parenchymal hemorrhage. No mass lesion. No extra-axial collection. No mass effect or midline shift. No hydrocephalus. Basilar cisterns are patent. Vascular: No hyperdense vessel. Atherosclerotic calcifications are present within the cavernous internal carotid arteries. Skull: No acute fracture or focal lesion. Sinuses/Orbits: Paranasal sinuses and mastoid air cells are clear. Bilateral lens replacement. Otherwise the orbits are unremarkable. Other: None. IMPRESSION: No acute intracranial abnormality. Electronically Signed   By: Iven Finn M.D.   On: 04/16/2022 18:52      EKG EKG Interpretation  Date/Time:  Sunday April 16 2022 18:16:02 EDT Ventricular Rate:  84 PR Interval:  153 QRS Duration: 81 QT Interval:  403 QTC Calculation: 477 R Axis:   65 Text Interpretation: Sinus rhythm Nonspecific T wave abnormality Confirmed by Lajean Saver [phone]) on 04/16/2022 6:19:16 PM  Radiology CT Head Wo Contrast  Result Date: 04/16/2022 CLINICAL DATA:  Head trauma, minor (Age >= 65y). Syncopal episode, pt felt faint and fell from standing position. Bystanders state pt hit head. Hx of dementia. EXAM: CT HEAD WITHOUT CONTRAST TECHNIQUE: Contiguous axial images were obtained from the base of the skull through the vertex without intravenous contrast. RADIATION DOSE REDUCTION: This exam was performed according to the departmental dose-optimization program which includes automated exposure control, adjustment of the mA and/or kV according to patient size and/or use of iterative reconstruction technique. COMPARISON:  CT head 02/19/2020 BRAIN: BRAIN Patchy and confluent areas of decreased attenuation are noted throughout the deep and periventricular white matter of the cerebral  hemispheres bilaterally, compatible with chronic microvascular ischemic disease. No evidence of large-territorial acute infarction. No parenchymal hemorrhage. No mass lesion.  No extra-axial collection. No mass effect or midline shift. No hydrocephalus. Basilar cisterns are patent. Vascular: No hyperdense vessel. Atherosclerotic calcifications are present within the cavernous internal carotid arteries. Skull: No acute fracture or focal lesion. Sinuses/Orbits: Paranasal sinuses and mastoid air cells are clear. Bilateral lens replacement. Otherwise the orbits are unremarkable. Other: None. IMPRESSION: No acute intracranial abnormality. Electronically Signed   By: Iven Finn M.D.   On: 04/16/2022 18:52    Procedures Procedures    Medications Ordered in ED Medications - No data to display  ED Course/ Medical Decision Making/ A&P                           Medical Decision Making Problems Addressed: Acute UTI: acute illness or injury with systemic symptoms Contusion of head, initial encounter: acute illness or injury with systemic symptoms that poses a threat to life or bodily functions Hypotension, unspecified hypotension type: acute illness or injury with systemic symptoms that poses a threat to life or bodily functions Syncope and collapse: acute illness or injury with systemic symptoms that poses a threat to life or bodily functions  Amount and/or Complexity of Data Reviewed Independent Historian: EMS    Details: hx External Data Reviewed: notes. Labs: ordered. Decision-making details documented in ED Course. Radiology: ordered and independent interpretation performed. Decision-making details documented in ED Course. ECG/medicine tests: ordered and independent interpretation performed. Decision-making details documented in ED Course.  Risk OTC drugs. Prescription drug management. Decision regarding hospitalization.   Iv ns. Continuous pulse ox and cardiac monitoring. Labs  ordered/sent. Imaging ordered.   Initially bp soft/low. Ns bolus. Pt denies fever/chills/sweats. No chest pain or abd pain.   Diff dx includes syncope, near syncope, mech fall, uti, sym anemia, etc.  - dispo decision including potential need for admission considered - will get labs and imaging and reassess.   Reviewed nursing notes and prior charts for additional history. External reports reviewed. On review prior clinic notes, it appears pt's baseline bp may be in upper 90s-100s range. Additional history from: EMS.   Cardiac monitor: sinus rhythm, rate 72.   Labs reviewed/interpreted by me - hgb normal.   CT reviewed/interpreted by me - no hem.   Acetaminophen po. Po fluids.   Bp improved. No faintness. No dysrhythmia on monitor.   Po fluids/food. Ambulate.   UA w 21-50 wbc. Will rx. Rocephin iv.   Pt currently appears stable for d/c.   Rec pcp f/u.  Return precautions provided.   CRITICAL CARE RE: hypotension, ?syncope w fall. Head contusion/injury.  Performed by: Mirna Mires Total critical care time: 40 minutes Critical care time was exclusive of separately billable procedures and treating other patients. Critical care was necessary to treat or prevent imminent or life-threatening deterioration. Critical care was time spent personally by me on the following activities: development of treatment plan with patient and/or surrogate as well as nursing, discussions with consultants, evaluation of patient's response to treatment, examination of patient, obtaining history from patient or surrogate, ordering and performing treatments and interventions, ordering and review of laboratory studies, ordering and review of radiographic studies, pulse oximetry and re-evaluation of patient's condition.           Final Clinical Impression(s) / ED Diagnoses Final diagnoses:  None    Rx / DC Orders ED Discharge Orders     None         Lajean Saver, MD 04/16/22 2127

## 2022-04-16 NOTE — ED Triage Notes (Signed)
Pt BIB EMS from [facility] for syncopal episode, pt felt faint and fell from standing position. Bystanders state pt hit head. Orthostatic +, hx of changes in BP with medication, unsure what medication that is. Hx dementia, A&Ox4, repetitive questioning at baseline.

## 2022-04-16 NOTE — Discharge Instructions (Addendum)
It was our pleasure to provide your ER care today - we hope that you feel better.  Fall precautions - use great care to help minimize risk of falling.   Drink plenty of fluids/stay well hydrated.  The lab tests show a urine infection - take antibiotic as prescribed.   Follow up with primary care doctor in the next few days - given borderline low blood pressure, discuss possible medication adjustment with your doctor then.   Return to ER if worse, new symptoms, fevers, new/severe pain, weak/fainting, chest pain, trouble breathing, or other concern.

## 2022-04-16 NOTE — ED Notes (Signed)
Patient transported to CT 

## 2022-04-16 NOTE — ED Notes (Signed)
MD Ashok Cordia made aware of pt BP.

## 2022-04-19 ENCOUNTER — Telehealth: Payer: Self-pay

## 2022-04-19 DIAGNOSIS — R41841 Cognitive communication deficit: Secondary | ICD-10-CM | POA: Diagnosis not present

## 2022-04-19 DIAGNOSIS — R2681 Unsteadiness on feet: Secondary | ICD-10-CM | POA: Diagnosis not present

## 2022-04-19 DIAGNOSIS — M6281 Muscle weakness (generalized): Secondary | ICD-10-CM | POA: Diagnosis not present

## 2022-04-19 DIAGNOSIS — R488 Other symbolic dysfunctions: Secondary | ICD-10-CM | POA: Diagnosis not present

## 2022-04-19 DIAGNOSIS — R2689 Other abnormalities of gait and mobility: Secondary | ICD-10-CM | POA: Diagnosis not present

## 2022-04-19 NOTE — Telephone Encounter (Signed)
---  Caller states that her sister's blood pressure is going from high to low. Now it is 98/61. She was in the ED Sunday with a UTI. Reports that she did fall on Sunday evening and hit her head and was taken to ED. She is dehydrated.  04/18/2022 6:22:01 PM Go to ED Now Rose Phi, RN, [place]  Comments User: Maximino Greenland, RN Date/Time Eilene Ghazi Time): 04/18/2022 6:14:38 PM Caller states she cares for sister due to memory issues User: Maximino Greenland, RN Date/Time Eilene Ghazi Time):   04/18/2022 6:20:31 PM 4th UTI in last few months  Referrals [facility] - ED  ---Caller states her sister's blood pressure and heartrate is fluctuating. Pt heartrate is 70 and bp is 100/63. Last 30 mins ago, it was 99/45. Denies any dizziness, chest pain or SOB. She was in the ER on sunday for a fall and diagnosed with a UTI.  04/18/2022 9:49:23 PM SEE PCP WITHIN 3 DAYS Colon, RN, [place]  04/19/22 1008 - LVM instructions to call back to schedule appt with any available provider. If call is returned, please find out who new PCP will be since this was a [facility] pt.

## 2022-04-19 NOTE — Telephone Encounter (Signed)
Pt has an appt with dr banks on 04-21-2022

## 2022-04-20 DIAGNOSIS — R2689 Other abnormalities of gait and mobility: Secondary | ICD-10-CM | POA: Diagnosis not present

## 2022-04-20 DIAGNOSIS — R2681 Unsteadiness on feet: Secondary | ICD-10-CM | POA: Diagnosis not present

## 2022-04-20 DIAGNOSIS — R488 Other symbolic dysfunctions: Secondary | ICD-10-CM | POA: Diagnosis not present

## 2022-04-20 DIAGNOSIS — M6281 Muscle weakness (generalized): Secondary | ICD-10-CM | POA: Diagnosis not present

## 2022-04-20 DIAGNOSIS — R41841 Cognitive communication deficit: Secondary | ICD-10-CM | POA: Diagnosis not present

## 2022-04-21 ENCOUNTER — Ambulatory Visit (INDEPENDENT_AMBULATORY_CARE_PROVIDER_SITE_OTHER): Payer: Medicare Other | Admitting: Family Medicine

## 2022-04-21 VITALS — BP 98/64 | HR 64 | Temp 98.2°F | Wt 130.8 lb

## 2022-04-21 DIAGNOSIS — I9589 Other hypotension: Secondary | ICD-10-CM

## 2022-04-21 DIAGNOSIS — I6523 Occlusion and stenosis of bilateral carotid arteries: Secondary | ICD-10-CM

## 2022-04-21 NOTE — Patient Instructions (Addendum)
It is important that you try to increase your intake of water and fluids.  For now hold (stop taking) losartan-hydrochlorothiazide 100-25 mg daily.  Continue checking blood pressures daily.  If BP becomes elevated consistently greater than 140/90 we can restart losartan 50 mg daily.  We will have you follow-up in the next 2-3 weeks to see how things are going.

## 2022-04-21 NOTE — Progress Notes (Signed)
Subjective:    Patient ID: Felicia Kelly, female    DOB: 06-27-1936, 86 y.o.   MRN: [id number]  Chief Complaint  Patient presents with   Follow-up    From ED visit. Patient is accompanied with sister. Sister reports she fell and hit her head and went to ED. Also for low BP.   Pt accompanied by her sister.  HPI Patient was seen today for ED f/u.  Pt seen 04/16/22 for hypotension causing fall.  Patient does not recall events surrounding incident.  CT head negative.  Patient given Keflex as urine with concerns for UTI and IV fluids.  BP readings over the last few wks 90s-100s/57-60s.  Pt taking losartan-hydrochlorothiazide 100-25 mg daily.  Patient drinking minimal water daily.   Past Medical History:  Diagnosis Date   Aortic atherosclerosis ([place])    a. noted on CT 01/2018.   Arthritis    Colonic polyp    Coronary atherosclerosis    a. noted on CT 01/2018.   Genital herpes    Hyperlipidemia    Hypertension    Mild carotid artery disease (HCC)    Osteoporosis    Overdose of cardiac medication    Pulmonary nodules    a. followed by PCP by serial Ct.   Thyroid disease    seeing Dr. Buddy Duty   Tremor     Allergies  Allergen Reactions   Tetracycline Itching   Atorvastatin Other (See Comments)    Leg cramps Leg cramps   Bactrim [Sulfamethoxazole-Trimethoprim] Itching   Celecoxib Other (See Comments)    Memory disturbance, slowed thinking Memory disturbance, slowed thinking   Niacin Other (See Comments)    Caused elevated BP   Niaspan [Niacin Er]     Elevated BP   Rosuvastatin Other (See Comments)    Elevated CPK   Simvastatin Other (See Comments)    Leg cramps Leg cramps   Tetracyclines & Related Itching   Penicillins Swelling and Rash    ALL CILLINS, pt's sister states "swelling" reported is related to the rash she gets     ROS General: Denies fever, chills, night sweats, changes in weight, changes in appetite  + decreased memory HEENT: Denies headaches, ear pain,  changes in vision, rhinorrhea, sore throat CV: Denies CP, palpitations, SOB, orthopnea Pulm: Denies SOB, cough, wheezing GI: Denies abdominal pain, nausea, vomiting, diarrhea, constipation GU: Denies dysuria, hematuria, frequency, vaginal discharge Msk: Denies muscle cramps, joint pains Neuro: Denies weakness, numbness, tingling Skin: Denies rashes, bruising Psych: Denies depression, anxiety, hallucinations     Objective:    Blood pressure 98/64, pulse 64, temperature 98.2 F (36.8 C), temperature source Oral, weight 130 lb 12.8 oz (59.3 kg), SpO2 96 %.  Gen. Pleasant, well-nourished, in no distress, normal affect   HEENT: [place]/AT, face symmetric, conjunctiva clear, no scleral icterus, PERRLA, EOMI, nares patent without drainage, pharynx without erythema or exudate.  TMs normal bilaterally. Neck: No JVD, no thyromegaly, no carotid bruits Lungs: no accessory muscle use, CTAB, no wheezes or rales Cardiovascular: RRR, no m/r/g, no peripheral edema Musculoskeletal: No deformities, no cyanosis or clubbing, normal tone Neuro:  A&Ox3, CN II-XII intact Skin:  Warm, no lesions/ rash   Wt Readings from Last 3 Encounters:  04/21/22 130 lb 12.8 oz (59.3 kg)  04/16/22 141 lb 5 oz (64.1 kg)  03/07/22 141 lb 4.8 oz (64.1 kg)    Lab Results  Component Value Date   WBC 11.0 (H) 04/16/2022   HGB 12.9 04/16/2022   HCT 40.5  04/16/2022   PLT 284 04/16/2022   GLUCOSE 123 (H) 04/16/2022   CHOL 137 12/21/2021   TRIG 142.0 12/21/2021   HDL 48.90 12/21/2021   LDLCALC 59 12/21/2021   ALT 15 02/03/2022   AST 17 02/03/2022   NA 139 04/16/2022   K 3.5 04/16/2022   CL 100 04/16/2022   CREATININE 1.17 (H) 04/16/2022   BUN 21 04/16/2022   CO2 23 04/16/2022   TSH 1.030 02/03/2022   HGBA1C 6.4 12/21/2021   MICROALBUR 3.4 (H) 12/21/2021    Assessment/Plan:  Other specified hypotension -Labs, imaging, notes from ED visit reviewed -Encouraged to complete course of antibiotic for UTI -Given  labile BP will have patient hold losartan-hydrochlorothiazide 100-25 mg daily. -Encourage patient to increase p.o. intake of water and fluids -Continue monitoring blood pressure and keeping a log to bring with you to clinic. -For elevations consistently greater than 140/90 discussed restarting a single medication at a lower dose. -Given precautions  F/u in 2-3 weeks with PCP.  Grier Mitts, MD

## 2022-04-22 DIAGNOSIS — M6281 Muscle weakness (generalized): Secondary | ICD-10-CM | POA: Diagnosis not present

## 2022-04-22 DIAGNOSIS — R488 Other symbolic dysfunctions: Secondary | ICD-10-CM | POA: Diagnosis not present

## 2022-04-22 DIAGNOSIS — R2681 Unsteadiness on feet: Secondary | ICD-10-CM | POA: Diagnosis not present

## 2022-04-22 DIAGNOSIS — R2689 Other abnormalities of gait and mobility: Secondary | ICD-10-CM | POA: Diagnosis not present

## 2022-04-22 DIAGNOSIS — R41841 Cognitive communication deficit: Secondary | ICD-10-CM | POA: Diagnosis not present

## 2022-04-24 DIAGNOSIS — M6281 Muscle weakness (generalized): Secondary | ICD-10-CM | POA: Diagnosis not present

## 2022-04-24 DIAGNOSIS — R2681 Unsteadiness on feet: Secondary | ICD-10-CM | POA: Diagnosis not present

## 2022-04-24 DIAGNOSIS — R488 Other symbolic dysfunctions: Secondary | ICD-10-CM | POA: Diagnosis not present

## 2022-04-24 DIAGNOSIS — R41841 Cognitive communication deficit: Secondary | ICD-10-CM | POA: Diagnosis not present

## 2022-04-24 DIAGNOSIS — R2689 Other abnormalities of gait and mobility: Secondary | ICD-10-CM | POA: Diagnosis not present

## 2022-04-26 DIAGNOSIS — R2689 Other abnormalities of gait and mobility: Secondary | ICD-10-CM | POA: Diagnosis not present

## 2022-04-26 DIAGNOSIS — R488 Other symbolic dysfunctions: Secondary | ICD-10-CM | POA: Diagnosis not present

## 2022-04-26 DIAGNOSIS — R2681 Unsteadiness on feet: Secondary | ICD-10-CM | POA: Diagnosis not present

## 2022-04-26 DIAGNOSIS — R41841 Cognitive communication deficit: Secondary | ICD-10-CM | POA: Diagnosis not present

## 2022-04-26 DIAGNOSIS — M6281 Muscle weakness (generalized): Secondary | ICD-10-CM | POA: Diagnosis not present

## 2022-04-27 DIAGNOSIS — R2689 Other abnormalities of gait and mobility: Secondary | ICD-10-CM | POA: Diagnosis not present

## 2022-04-27 DIAGNOSIS — R488 Other symbolic dysfunctions: Secondary | ICD-10-CM | POA: Diagnosis not present

## 2022-04-27 DIAGNOSIS — R41841 Cognitive communication deficit: Secondary | ICD-10-CM | POA: Diagnosis not present

## 2022-04-27 DIAGNOSIS — M6281 Muscle weakness (generalized): Secondary | ICD-10-CM | POA: Diagnosis not present

## 2022-04-27 DIAGNOSIS — R2681 Unsteadiness on feet: Secondary | ICD-10-CM | POA: Diagnosis not present

## 2022-04-28 ENCOUNTER — Encounter: Payer: Self-pay | Admitting: Family Medicine

## 2022-04-28 DIAGNOSIS — R41841 Cognitive communication deficit: Secondary | ICD-10-CM | POA: Diagnosis not present

## 2022-04-28 DIAGNOSIS — M6281 Muscle weakness (generalized): Secondary | ICD-10-CM | POA: Diagnosis not present

## 2022-04-28 DIAGNOSIS — R2689 Other abnormalities of gait and mobility: Secondary | ICD-10-CM | POA: Diagnosis not present

## 2022-04-28 DIAGNOSIS — R488 Other symbolic dysfunctions: Secondary | ICD-10-CM | POA: Diagnosis not present

## 2022-04-28 DIAGNOSIS — R2681 Unsteadiness on feet: Secondary | ICD-10-CM | POA: Diagnosis not present

## 2022-05-01 DIAGNOSIS — R2689 Other abnormalities of gait and mobility: Secondary | ICD-10-CM | POA: Diagnosis not present

## 2022-05-01 DIAGNOSIS — R41841 Cognitive communication deficit: Secondary | ICD-10-CM | POA: Diagnosis not present

## 2022-05-01 DIAGNOSIS — R2681 Unsteadiness on feet: Secondary | ICD-10-CM | POA: Diagnosis not present

## 2022-05-01 DIAGNOSIS — R488 Other symbolic dysfunctions: Secondary | ICD-10-CM | POA: Diagnosis not present

## 2022-05-01 DIAGNOSIS — M6281 Muscle weakness (generalized): Secondary | ICD-10-CM | POA: Diagnosis not present

## 2022-05-03 DIAGNOSIS — R41841 Cognitive communication deficit: Secondary | ICD-10-CM | POA: Diagnosis not present

## 2022-05-03 DIAGNOSIS — M6281 Muscle weakness (generalized): Secondary | ICD-10-CM | POA: Diagnosis not present

## 2022-05-03 DIAGNOSIS — R2689 Other abnormalities of gait and mobility: Secondary | ICD-10-CM | POA: Diagnosis not present

## 2022-05-03 DIAGNOSIS — R488 Other symbolic dysfunctions: Secondary | ICD-10-CM | POA: Diagnosis not present

## 2022-05-03 DIAGNOSIS — R2681 Unsteadiness on feet: Secondary | ICD-10-CM | POA: Diagnosis not present

## 2022-05-09 DIAGNOSIS — M6281 Muscle weakness (generalized): Secondary | ICD-10-CM | POA: Diagnosis not present

## 2022-05-09 DIAGNOSIS — R2681 Unsteadiness on feet: Secondary | ICD-10-CM | POA: Diagnosis not present

## 2022-05-09 DIAGNOSIS — R41841 Cognitive communication deficit: Secondary | ICD-10-CM | POA: Diagnosis not present

## 2022-05-09 DIAGNOSIS — R2689 Other abnormalities of gait and mobility: Secondary | ICD-10-CM | POA: Diagnosis not present

## 2022-05-09 DIAGNOSIS — R488 Other symbolic dysfunctions: Secondary | ICD-10-CM | POA: Diagnosis not present

## 2022-05-10 DIAGNOSIS — M6281 Muscle weakness (generalized): Secondary | ICD-10-CM | POA: Diagnosis not present

## 2022-05-10 DIAGNOSIS — R2689 Other abnormalities of gait and mobility: Secondary | ICD-10-CM | POA: Diagnosis not present

## 2022-05-10 DIAGNOSIS — R2681 Unsteadiness on feet: Secondary | ICD-10-CM | POA: Diagnosis not present

## 2022-05-10 DIAGNOSIS — R488 Other symbolic dysfunctions: Secondary | ICD-10-CM | POA: Diagnosis not present

## 2022-05-10 DIAGNOSIS — R41841 Cognitive communication deficit: Secondary | ICD-10-CM | POA: Diagnosis not present

## 2022-05-12 DIAGNOSIS — R41841 Cognitive communication deficit: Secondary | ICD-10-CM | POA: Diagnosis not present

## 2022-05-12 DIAGNOSIS — R2689 Other abnormalities of gait and mobility: Secondary | ICD-10-CM | POA: Diagnosis not present

## 2022-05-12 DIAGNOSIS — R2681 Unsteadiness on feet: Secondary | ICD-10-CM | POA: Diagnosis not present

## 2022-05-12 DIAGNOSIS — M6281 Muscle weakness (generalized): Secondary | ICD-10-CM | POA: Diagnosis not present

## 2022-05-12 DIAGNOSIS — R488 Other symbolic dysfunctions: Secondary | ICD-10-CM | POA: Diagnosis not present

## 2022-05-16 ENCOUNTER — Encounter: Payer: Self-pay | Admitting: Family Medicine

## 2022-05-16 ENCOUNTER — Ambulatory Visit (INDEPENDENT_AMBULATORY_CARE_PROVIDER_SITE_OTHER): Payer: Medicare Other | Admitting: Family Medicine

## 2022-05-16 VITALS — BP 108/62 | HR 71 | Temp 98.1°F | Wt 133.2 lb

## 2022-05-16 DIAGNOSIS — I6523 Occlusion and stenosis of bilateral carotid arteries: Secondary | ICD-10-CM | POA: Diagnosis not present

## 2022-05-16 DIAGNOSIS — R41841 Cognitive communication deficit: Secondary | ICD-10-CM | POA: Diagnosis not present

## 2022-05-16 DIAGNOSIS — R2689 Other abnormalities of gait and mobility: Secondary | ICD-10-CM | POA: Diagnosis not present

## 2022-05-16 DIAGNOSIS — I10 Essential (primary) hypertension: Secondary | ICD-10-CM

## 2022-05-16 DIAGNOSIS — M6281 Muscle weakness (generalized): Secondary | ICD-10-CM | POA: Diagnosis not present

## 2022-05-16 DIAGNOSIS — R2681 Unsteadiness on feet: Secondary | ICD-10-CM | POA: Diagnosis not present

## 2022-05-16 DIAGNOSIS — R488 Other symbolic dysfunctions: Secondary | ICD-10-CM | POA: Diagnosis not present

## 2022-05-16 NOTE — Progress Notes (Signed)
g  Subjective:    Patient ID: Felicia Kelly, female    DOB: 1935/11/22, 86 y.o.   MRN: [id number]  HPI Here with her sister to follow up on her BP. She was in the ED on 04-16-22 after a fall, and it was felt her low BP was part of the cause. Then she saw Dr. Volanda Napoleon on 04-21-22 and the BP was still low. She was advised to stop the Losartan HCT, and her BP has come back up since then. Most of her systolic readings have been in the 130s or 140s, with the diastolic always being normal. In the last 2 weeks she had 2 systolic readings over 295 (one 154 and one 160). She feels better. She is drinking more water than before.    Review of Systems  Constitutional: Negative.   Respiratory: Negative.    Cardiovascular: Negative.   Neurological: Negative.        Objective:   Physical Exam Constitutional:      Appearance: Normal appearance.  Cardiovascular:     Rate and Rhythm: Normal rate and regular rhythm.     Heart sounds: Normal heart sounds.  Pulmonary:     Effort: Pulmonary effort is normal.     Breath sounds: Normal breath sounds.  Musculoskeletal:     Right lower leg: No edema.     Left lower leg: No edema.  Neurological:     Mental Status: She is alert and oriented to person, place, and time.     Gait: Gait normal.           Assessment & Plan:  Her BP is now stable off Losartan HCT. We agreed to stay off this, and the staff at [facility] will continue to monitor this. She will see Dr. Legrand Como on 06-08-22.  Alysia Penna, MD

## 2022-05-17 DIAGNOSIS — R2689 Other abnormalities of gait and mobility: Secondary | ICD-10-CM | POA: Diagnosis not present

## 2022-05-17 DIAGNOSIS — R488 Other symbolic dysfunctions: Secondary | ICD-10-CM | POA: Diagnosis not present

## 2022-05-17 DIAGNOSIS — R41841 Cognitive communication deficit: Secondary | ICD-10-CM | POA: Diagnosis not present

## 2022-05-17 DIAGNOSIS — R2681 Unsteadiness on feet: Secondary | ICD-10-CM | POA: Diagnosis not present

## 2022-05-17 DIAGNOSIS — M6281 Muscle weakness (generalized): Secondary | ICD-10-CM | POA: Diagnosis not present

## 2022-05-18 DIAGNOSIS — R41841 Cognitive communication deficit: Secondary | ICD-10-CM | POA: Diagnosis not present

## 2022-05-18 DIAGNOSIS — R2681 Unsteadiness on feet: Secondary | ICD-10-CM | POA: Diagnosis not present

## 2022-05-18 DIAGNOSIS — M6281 Muscle weakness (generalized): Secondary | ICD-10-CM | POA: Diagnosis not present

## 2022-05-18 DIAGNOSIS — R2689 Other abnormalities of gait and mobility: Secondary | ICD-10-CM | POA: Diagnosis not present

## 2022-05-18 DIAGNOSIS — R488 Other symbolic dysfunctions: Secondary | ICD-10-CM | POA: Diagnosis not present

## 2022-05-19 DIAGNOSIS — R2681 Unsteadiness on feet: Secondary | ICD-10-CM | POA: Diagnosis not present

## 2022-05-19 DIAGNOSIS — R488 Other symbolic dysfunctions: Secondary | ICD-10-CM | POA: Diagnosis not present

## 2022-05-19 DIAGNOSIS — R2689 Other abnormalities of gait and mobility: Secondary | ICD-10-CM | POA: Diagnosis not present

## 2022-05-19 DIAGNOSIS — M6281 Muscle weakness (generalized): Secondary | ICD-10-CM | POA: Diagnosis not present

## 2022-05-19 DIAGNOSIS — R41841 Cognitive communication deficit: Secondary | ICD-10-CM | POA: Diagnosis not present

## 2022-05-23 DIAGNOSIS — R2689 Other abnormalities of gait and mobility: Secondary | ICD-10-CM | POA: Diagnosis not present

## 2022-05-23 DIAGNOSIS — R2681 Unsteadiness on feet: Secondary | ICD-10-CM | POA: Diagnosis not present

## 2022-05-23 DIAGNOSIS — R41841 Cognitive communication deficit: Secondary | ICD-10-CM | POA: Diagnosis not present

## 2022-05-23 DIAGNOSIS — M6281 Muscle weakness (generalized): Secondary | ICD-10-CM | POA: Diagnosis not present

## 2022-05-23 DIAGNOSIS — R488 Other symbolic dysfunctions: Secondary | ICD-10-CM | POA: Diagnosis not present

## 2022-05-24 DIAGNOSIS — R2681 Unsteadiness on feet: Secondary | ICD-10-CM | POA: Diagnosis not present

## 2022-05-24 DIAGNOSIS — R41841 Cognitive communication deficit: Secondary | ICD-10-CM | POA: Diagnosis not present

## 2022-05-24 DIAGNOSIS — R2689 Other abnormalities of gait and mobility: Secondary | ICD-10-CM | POA: Diagnosis not present

## 2022-05-24 DIAGNOSIS — R488 Other symbolic dysfunctions: Secondary | ICD-10-CM | POA: Diagnosis not present

## 2022-05-24 DIAGNOSIS — M6281 Muscle weakness (generalized): Secondary | ICD-10-CM | POA: Diagnosis not present

## 2022-05-25 DIAGNOSIS — R41841 Cognitive communication deficit: Secondary | ICD-10-CM | POA: Diagnosis not present

## 2022-05-25 DIAGNOSIS — M6281 Muscle weakness (generalized): Secondary | ICD-10-CM | POA: Diagnosis not present

## 2022-05-25 DIAGNOSIS — R488 Other symbolic dysfunctions: Secondary | ICD-10-CM | POA: Diagnosis not present

## 2022-05-25 DIAGNOSIS — R2681 Unsteadiness on feet: Secondary | ICD-10-CM | POA: Diagnosis not present

## 2022-05-25 DIAGNOSIS — R2689 Other abnormalities of gait and mobility: Secondary | ICD-10-CM | POA: Diagnosis not present

## 2022-05-26 DIAGNOSIS — R2689 Other abnormalities of gait and mobility: Secondary | ICD-10-CM | POA: Diagnosis not present

## 2022-05-26 DIAGNOSIS — R488 Other symbolic dysfunctions: Secondary | ICD-10-CM | POA: Diagnosis not present

## 2022-05-26 DIAGNOSIS — R2681 Unsteadiness on feet: Secondary | ICD-10-CM | POA: Diagnosis not present

## 2022-05-26 DIAGNOSIS — R41841 Cognitive communication deficit: Secondary | ICD-10-CM | POA: Diagnosis not present

## 2022-05-26 DIAGNOSIS — M6281 Muscle weakness (generalized): Secondary | ICD-10-CM | POA: Diagnosis not present

## 2022-05-27 ENCOUNTER — Emergency Department (HOSPITAL_COMMUNITY): Payer: Medicare Other

## 2022-05-27 ENCOUNTER — Other Ambulatory Visit: Payer: Self-pay

## 2022-05-27 ENCOUNTER — Encounter (HOSPITAL_COMMUNITY): Payer: Self-pay

## 2022-05-27 ENCOUNTER — Observation Stay (HOSPITAL_COMMUNITY)
Admission: EM | Admit: 2022-05-27 | Discharge: 2022-05-30 | Disposition: A | Discharge status: Home Health | Payer: Medicare Other | Attending: Internal Medicine | Admitting: Internal Medicine

## 2022-05-27 DIAGNOSIS — E039 Hypothyroidism, unspecified: Secondary | ICD-10-CM | POA: Diagnosis not present

## 2022-05-27 DIAGNOSIS — S0990XA Unspecified injury of head, initial encounter: Secondary | ICD-10-CM | POA: Diagnosis not present

## 2022-05-27 DIAGNOSIS — E876 Hypokalemia: Principal | ICD-10-CM

## 2022-05-27 DIAGNOSIS — W19XXXA Unspecified fall, initial encounter: Secondary | ICD-10-CM | POA: Insufficient documentation

## 2022-05-27 DIAGNOSIS — R531 Weakness: Secondary | ICD-10-CM | POA: Diagnosis not present

## 2022-05-27 DIAGNOSIS — E7800 Pure hypercholesterolemia, unspecified: Secondary | ICD-10-CM | POA: Diagnosis not present

## 2022-05-27 DIAGNOSIS — I700 Atherosclerosis of aorta: Secondary | ICD-10-CM | POA: Diagnosis present

## 2022-05-27 DIAGNOSIS — Y939 Activity, unspecified: Secondary | ICD-10-CM | POA: Insufficient documentation

## 2022-05-27 DIAGNOSIS — M4322 Fusion of spine, cervical region: Secondary | ICD-10-CM | POA: Diagnosis not present

## 2022-05-27 DIAGNOSIS — E162 Hypoglycemia, unspecified: Secondary | ICD-10-CM | POA: Diagnosis not present

## 2022-05-27 DIAGNOSIS — R079 Chest pain, unspecified: Secondary | ICD-10-CM | POA: Diagnosis not present

## 2022-05-27 DIAGNOSIS — Z79899 Other long term (current) drug therapy: Secondary | ICD-10-CM | POA: Insufficient documentation

## 2022-05-27 DIAGNOSIS — F0390 Unspecified dementia without behavioral disturbance: Secondary | ICD-10-CM | POA: Diagnosis not present

## 2022-05-27 DIAGNOSIS — I10 Essential (primary) hypertension: Secondary | ICD-10-CM | POA: Diagnosis present

## 2022-05-27 DIAGNOSIS — R000 Tachycardia, unspecified: Secondary | ICD-10-CM | POA: Diagnosis not present

## 2022-05-27 DIAGNOSIS — M852 Hyperostosis of skull: Secondary | ICD-10-CM | POA: Diagnosis not present

## 2022-05-27 DIAGNOSIS — R102 Pelvic and perineal pain: Secondary | ICD-10-CM | POA: Diagnosis not present

## 2022-05-27 DIAGNOSIS — S199XXA Unspecified injury of neck, initial encounter: Secondary | ICD-10-CM | POA: Diagnosis not present

## 2022-05-27 DIAGNOSIS — Y929 Unspecified place or not applicable: Secondary | ICD-10-CM | POA: Diagnosis not present

## 2022-05-27 DIAGNOSIS — Y999 Unspecified external cause status: Secondary | ICD-10-CM | POA: Insufficient documentation

## 2022-05-27 DIAGNOSIS — I2510 Atherosclerotic heart disease of native coronary artery without angina pectoris: Secondary | ICD-10-CM | POA: Insufficient documentation

## 2022-05-27 DIAGNOSIS — Z7982 Long term (current) use of aspirin: Secondary | ICD-10-CM | POA: Insufficient documentation

## 2022-05-27 DIAGNOSIS — R2681 Unsteadiness on feet: Secondary | ICD-10-CM | POA: Insufficient documentation

## 2022-05-27 DIAGNOSIS — Z981 Arthrodesis status: Secondary | ICD-10-CM | POA: Diagnosis not present

## 2022-05-27 DIAGNOSIS — J329 Chronic sinusitis, unspecified: Secondary | ICD-10-CM | POA: Diagnosis not present

## 2022-05-27 DIAGNOSIS — U071 COVID-19: Secondary | ICD-10-CM | POA: Diagnosis not present

## 2022-05-27 DIAGNOSIS — I739 Peripheral vascular disease, unspecified: Secondary | ICD-10-CM | POA: Diagnosis not present

## 2022-05-27 LAB — URINALYSIS, ROUTINE W REFLEX MICROSCOPIC
Bilirubin Urine: NEGATIVE
Glucose, UA: NEGATIVE mg/dL
Ketones, ur: 5 mg/dL — AB
Leukocytes,Ua: NEGATIVE
Nitrite: NEGATIVE
Protein, ur: 100 mg/dL — AB
Specific Gravity, Urine: 1.018 (ref 1.005–1.030)
pH: 5 (ref 5.0–8.0)

## 2022-05-27 LAB — BASIC METABOLIC PANEL
Anion gap: 8 (ref 5–15)
BUN: 20 mg/dL (ref 8–23)
CO2: 24 mmol/L (ref 22–32)
Calcium: 9.4 mg/dL (ref 8.9–10.3)
Chloride: 109 mmol/L (ref 98–111)
Creatinine, Ser: 0.85 mg/dL (ref 0.44–1.00)
GFR, Estimated: >60 mL/min (ref >60)
Glucose, Bld: 105 mg/dL — ABNORMAL HIGH (ref 70–99)
Potassium: 2.8 mmol/L — ABNORMAL LOW (ref 3.5–5.1)
Sodium: 141 mmol/L (ref 135–145)

## 2022-05-27 LAB — CBC WITH DIFFERENTIAL/PLATELET
Abs Immature Granulocytes: 0.01 10*3/uL (ref 0.00–0.07)
Basophils Absolute: 0 10*3/uL (ref 0.0–0.1)
Basophils Relative: 0 %
Eosinophils Absolute: 0.1 10*3/uL (ref 0.0–0.5)
Eosinophils Relative: 1 %
HCT: 37.9 % (ref 36.0–46.0)
Hemoglobin: 12.3 g/dL (ref 12.0–15.0)
Immature Granulocytes: 0 %
Lymphocytes Relative: 22 %
Lymphs Abs: 1.4 10*3/uL (ref 0.7–4.0)
MCH: 29.8 pg (ref 26.0–34.0)
MCHC: 32.5 g/dL (ref 30.0–36.0)
MCV: 91.8 fL (ref 80.0–100.0)
Monocytes Absolute: 0.6 10*3/uL (ref 0.1–1.0)
Monocytes Relative: 9 %
Neutro Abs: 4.3 10*3/uL (ref 1.7–7.7)
Neutrophils Relative %: 68 %
Platelets: 218 10*3/uL (ref 150–400)
RBC: 4.13 MIL/uL (ref 3.87–5.11)
RDW: 13.5 % (ref 11.5–15.5)
WBC: 6.3 10*3/uL (ref 4.0–10.5)
nRBC: 0 % (ref 0.0–0.2)

## 2022-05-27 MED ORDER — ADULT MULTIVITAMIN W/MINERALS CH
1.0000 | ORAL_TABLET | Freq: Every day | ORAL | Status: DC
  Administered 2022-05-28 – 2022-05-30 (×3): 1 via ORAL
  Filled 2022-05-27 (×3): qty 1

## 2022-05-27 MED ORDER — POTASSIUM CHLORIDE CRYS ER 20 MEQ PO TBCR
80.0000 meq | EXTENDED_RELEASE_TABLET | Freq: Once | ORAL | Status: AC
  Administered 2022-05-27: 80 meq via ORAL
  Filled 2022-05-27: qty 4

## 2022-05-27 MED ORDER — EZETIMIBE 10 MG PO TABS
10.0000 mg | ORAL_TABLET | Freq: Every day | ORAL | Status: DC
  Administered 2022-05-28 – 2022-05-30 (×3): 10 mg via ORAL
  Filled 2022-05-27 (×3): qty 1

## 2022-05-27 MED ORDER — POTASSIUM CHLORIDE CRYS ER 10 MEQ PO TBCR
10.0000 meq | EXTENDED_RELEASE_TABLET | Freq: Every day | ORAL | Status: DC
  Administered 2022-05-28 – 2022-05-30 (×3): 10 meq via ORAL
  Filled 2022-05-27 (×3): qty 1

## 2022-05-27 MED ORDER — LEVOTHYROXINE SODIUM 50 MCG PO TABS
50.0000 ug | ORAL_TABLET | ORAL | Status: DC
  Administered 2022-05-29 – 2022-05-30 (×2): 50 ug via ORAL
  Filled 2022-05-27 (×2): qty 1

## 2022-05-27 MED ORDER — RIVASTIGMINE TARTRATE 3 MG PO CAPS
4.5000 mg | ORAL_CAPSULE | Freq: Two times a day (BID) | ORAL | Status: DC
  Administered 2022-05-27 – 2022-05-30 (×6): 4.5 mg via ORAL
  Filled 2022-05-27 (×6): qty 1

## 2022-05-27 MED ORDER — LEVOTHYROXINE SODIUM 100 MCG PO TABS
100.0000 ug | ORAL_TABLET | ORAL | Status: DC
  Administered 2022-05-28: 100 ug via ORAL
  Filled 2022-05-27: qty 1

## 2022-05-27 MED ORDER — LACTATED RINGERS IV SOLN: INTRAVENOUS | Status: AC

## 2022-05-27 MED ORDER — MELATONIN 3 MG PO TABS
3.0000 mg | ORAL_TABLET | Freq: Every day | ORAL | Status: DC
  Administered 2022-05-27 – 2022-05-29 (×3): 3 mg via ORAL
  Filled 2022-05-27 (×3): qty 1

## 2022-05-27 MED ORDER — METFORMIN HCL ER 500 MG PO TB24
500.0000 mg | ORAL_TABLET | Freq: Every day | ORAL | Status: DC
  Administered 2022-05-28: 500 mg via ORAL
  Filled 2022-05-27: qty 1

## 2022-05-27 MED ORDER — PRAVASTATIN SODIUM 40 MG PO TABS
40.0000 mg | ORAL_TABLET | Freq: Every evening | ORAL | Status: DC
  Administered 2022-05-27 – 2022-05-29 (×3): 40 mg via ORAL
  Filled 2022-05-27 (×2): qty 1
  Filled 2022-05-27: qty 2

## 2022-05-27 MED ORDER — LEVOTHYROXINE SODIUM 50 MCG PO TABS: 50.0000 ug | ORAL_TABLET | ORAL | Status: DC

## 2022-05-27 MED ORDER — VITAMIN B-12 100 MCG PO TABS
100.0000 ug | ORAL_TABLET | Freq: Every day | ORAL | Status: DC
  Administered 2022-05-28 – 2022-05-30 (×3): 100 ug via ORAL
  Filled 2022-05-27 (×3): qty 1

## 2022-05-27 MED ORDER — PROSIGHT PO TABS
1.0000 | ORAL_TABLET | Freq: Every morning | ORAL | Status: DC
  Administered 2022-05-28 – 2022-05-30 (×3): 1 via ORAL
  Filled 2022-05-27 (×3): qty 1

## 2022-05-27 MED ORDER — MAGNESIUM OXIDE -MG SUPPLEMENT 400 (240 MG) MG PO TABS
200.0000 mg | ORAL_TABLET | Freq: Every day | ORAL | Status: DC
  Administered 2022-05-28: 200 mg via ORAL
  Filled 2022-05-27: qty 1

## 2022-05-27 NOTE — ED Triage Notes (Signed)
Pt arrives with family for unwitnessed fall. Per family, pt has dementia cannot remember if she hit her head during the fall. Pt has hx of UTIs.

## 2022-05-27 NOTE — ED Provider Notes (Signed)
[facility] DEPT Provider Note   CSN: [id number] Arrival date & time: 05/27/22  1546     History  Chief Complaint  Patient presents with   [facility] is a 86 y.o. female.  Patient presents to the hospital with family due to an unwitnessed fall.  Patient has dementia and cannot remember if she hit her head during the fall.  Patient does have reported history of urinary tract infections and hypokalemia.  Patient has no complaints at the time of my examination.  Past medical history significant for arthritis, hyperlipidemia, osteoporosis, hypertension, dementia as reported by family, denies blood thinner usage  HPI     Home Medications Prior to Admission medications   Medication Sig Start Date End Date Taking? Authorizing Provider  aspirin EC 81 MG tablet Take 81 mg by mouth every evening. Swallow whole.    [provider]  Cholecalciferol (VITAMIN D3 PO) Take 1,000 Units by mouth 3 (three) times a week.    [provider]  cyanocobalamin 100 MCG tablet Take 100 mcg by mouth daily.    [provider]  ezetimibe (ZETIA) 10 MG tablet Take 1 tablet (10 mg total) by mouth daily. Patient taking differently: Take 10 mg by mouth every evening. 02/14/22   [name], MD  levothyroxine (SYNTHROID) 50 MCG tablet Take 50 mcg by mouth daily before breakfast.    [provider]  Magnesium 250 MG TABS Take 1 tablet by mouth daily.    [provider]  Melatonin 10 MG TABS Take 1 tablet by mouth at bedtime.    [provider]  metFORMIN (GLUCOPHAGE-XR) 500 MG 24 hr tablet Take 1 tablet (500 mg total) by mouth daily with breakfast. 02/16/22   Caren Macadam, MD  Multiple Vitamin (MULTIVITAMIN) tablet Take 1 tablet by mouth daily.    [provider]  potassium chloride (KLOR-CON M) 10 MEQ tablet Take 1 tablet (10 mEq total) by mouth daily. 02/27/22   [name], MD   pravastatin (PRAVACHOL) 40 MG tablet TAKE 1 TABLET ONCE DAILY. Patient taking differently: Take 40 mg by mouth daily. 02/16/22   [name], MD  Probiotic Product (PROBIOTIC PO) Take 1 tablet by mouth every evening.    [provider]  rivastigmine (EXELON) 4.5 MG capsule Take 1 capsule (4.5 mg total) by mouth 2 (two) times daily. 02/27/22   Rondel Jumbo, PA-C  valACYclovir (VALTREX) 500 MG tablet TAKE ONE TABLET BY MOUTH ONCE DAILY Patient taking differently: Take 500 mg by mouth daily. 02/16/22   Caren Macadam, MD      Allergies    Tetracycline, Atorvastatin, Bactrim [sulfamethoxazole-trimethoprim], Celecoxib, Niacin, Niaspan [niacin er], Rosuvastatin, Simvastatin, Tetracyclines & related, and Penicillins    Review of Systems   Review of Systems  Genitourinary:  Negative for dysuria.  Musculoskeletal:  Negative for arthralgias and neck pain.  Neurological:  Negative for headaches.    Physical Exam Updated Vital Signs BP 134/71   Pulse 80   Temp 98 F (36.7 C) (Oral)   Resp 19   Ht '5\' 2"'$  (1.575 m)   Wt 59 kg   SpO2 97%   BMI 23.78 kg/m  Physical Exam Vitals and nursing note reviewed.  Constitutional:      General: She is not in acute distress. HENT:     Head: Normocephalic and atraumatic.     Mouth/Throat:     Mouth: Mucous membranes are moist.  Eyes:     Extraocular Movements: Extraocular movements intact.     Conjunctiva/sclera: Conjunctivae normal.     Pupils: Pupils are equal, round, and reactive to light.  Cardiovascular:     Rate and Rhythm: Normal rate and regular rhythm.     Pulses: Normal pulses.     Heart sounds: Normal heart sounds.  Pulmonary:     Effort: Pulmonary effort is normal.     Breath sounds: Normal breath sounds.  Abdominal:     Palpations: Abdomen is soft.     Tenderness: There is no abdominal tenderness.  Musculoskeletal:        General: Normal range of motion.     Cervical back: Normal range of motion and neck  supple.  Skin:    General: Skin is warm and dry.     Capillary Refill: Capillary refill takes less than 2 seconds.  Neurological:     Mental Status: She is alert. Mental status is at baseline.     ED Results / Procedures / Treatments   Labs (all labs ordered are listed, but only abnormal results are displayed) Labs Reviewed  BASIC METABOLIC PANEL - Abnormal; Notable for the following components:      Result Value   Potassium 2.8 (*)    Glucose, Bld 105 (*)    All other components within normal limits  CBC WITH DIFFERENTIAL/PLATELET  URINALYSIS, ROUTINE W REFLEX MICROSCOPIC    EKG None  Radiology DG Pelvis 1-2 Views  Result Date: 05/27/2022 CLINICAL DATA:  Pelvic pain.  Recent fall. EXAM: PELVIS - 1-2 VIEW COMPARISON:  04/08/2014 FINDINGS: There is no evidence of acute fracture or dislocation. Bilateral total hip arthroplasties again noted. No acute bony abnormalities are present. IMPRESSION: No acute abnormality. Electronically Signed   By: Margarette Canada M.D.   On: 05/27/2022 17:29   DG Chest 2 View  Result Date: 05/27/2022 CLINICAL DATA:  Chest pain EXAM: CHEST - 2 VIEW COMPARISON:  02/03/2022 and prior studies FINDINGS: The cardiomediastinal silhouette is unremarkable. Mild peribronchial thickening again noted. There is no evidence of focal airspace disease, pulmonary edema, suspicious pulmonary nodule/mass, pleural effusion, or pneumothorax. No acute bony abnormalities are identified. IMPRESSION: No evidence of acute cardiopulmonary disease. Electronically Signed   By: Margarette Canada M.D.   On: 05/27/2022 17:28   CT Cervical Spine Wo Contrast  Result Date: 05/27/2022 CLINICAL DATA:  Trauma EXAM: CT CERVICAL SPINE WITHOUT CONTRAST TECHNIQUE: Multidetector CT imaging of the cervical spine was performed without intravenous contrast. Multiplanar CT image reconstructions were also generated. RADIATION DOSE REDUCTION: This exam was performed according to the departmental dose-optimization  program which includes automated exposure control, adjustment of the mA and/or kV according to patient size and/or use of iterative reconstruction technique. COMPARISON:  02/18/2020 FINDINGS: Alignment: Alignment of posterior margins of vertebral bodies is unremarkable. Skull base and vertebrae: No recent fracture is seen. There is fusion of C3 and C4 vertebrae. Soft tissues and spinal canal: There is no central spinal stenosis. Disc levels: There is encroachment of neural foramina from C4-C7 levels. Upper chest: Unremarkable. Other: None. IMPRESSION: No recent fracture is seen Cervical spondylosis with encroachment of neural foramina from C4-C7 levels. Electronically Signed   By: Elmer Picker M.D.   On: 05/27/2022 17:18   CT Head Wo Contrast  Result Date: 05/27/2022 CLINICAL DATA:  Trauma EXAM: CT HEAD WITHOUT CONTRAST TECHNIQUE: Contiguous axial images were obtained from the base of the skull through the vertex without intravenous contrast. RADIATION DOSE REDUCTION: This  exam was performed according to the departmental dose-optimization program which includes automated exposure control, adjustment of the mA and/or kV according to patient size and/or use of iterative reconstruction technique. COMPARISON:  04/16/2022 FINDINGS: Brain: No acute intracranial findings are seen. There is possible tiny lipoma in the falx. There are no signs of bleeding within the cranium. Cortical sulci are prominent. There is decreased density in periventricular white matter. Vascular: Unremarkable. Skull: No fracture is seen in calvarium. Hyperostosis frontalis interna is seen. Sinuses/Orbits: There is mild mucosal thickening in ethmoid and left maxillary sinuses. Other: None. IMPRESSION: No acute intracranial findings are seen in noncontrast CT brain. Atrophy. Small vessel disease. Mild chronic sinusitis. Electronically Signed   By: Elmer Picker M.D.   On: 05/27/2022 17:14    Procedures Procedures    Medications  Ordered in ED Medications  potassium chloride SA (KLOR-CON M) CR tablet 80 mEq (80 mEq Oral Given 05/27/22 1950)    ED Course/ Medical Decision Making/ A&P                           Medical Decision Making Amount and/or Complexity of Data Reviewed Labs: ordered. Radiology: ordered.  Risk Prescription drug management.   Patient presents with a chief complaint of unwitnessed fall.  No current complaints of injury.  I ordered and interpreted imaging including plain films of the chest, pelvis and CT head and CT cervical spine.  Results were negative for acute abnormalities.  I agree with the radiologist interpretations  I ordered and reviewed lab results.  Pertinent results include potassium of 2.8.  Urinalysis pending at this time.  I ordered the patient potassium for hypokalemia.  The patient does not appear to have any acute injuries at this time.  Patient care being signed out to [facility] [place].  Plan for probable discharge pending urinalysis        Final Clinical Impression(s) / ED Diagnoses Final diagnoses:  Hypokalemia    Rx / DC Orders ED Discharge Orders     None         Ronny Bacon 05/27/22 Ericson, Ankit, MD 05/27/22 2235

## 2022-05-27 NOTE — ED Provider Triage Note (Signed)
Emergency Medicine Provider Triage Evaluation Note  Felicia Kelly [place] , a 86 y.o. female  was evaluated in triage.  Pt complains of unsupervised fall.  Patient has dementia, lives in here to strange independent living.  She was found this morning on the ground, was unable to rise.  At baseline she is usually able to ambulate with a cane.  Does not remember the incident, denies any pain anywhere.  History provided primarily by patient's sister.  Patient not on blood thinners..  Recent UTIs per sister  Review of Systems  Per HPI Physical Exam  BP 118/67 (BP Location: Left Arm)   Pulse 88   Temp 98 F (36.7 C) (Oral)   Resp 14   Ht '5\' 2"'$  (1.575 m)   Wt 59 kg   SpO2 96%   BMI 23.78 kg/m  Gen:   Awake, no distress   Resp:  Normal effort  MSK:   Pelvis tolerates passive ROM of upper and lower extremities.  Not ambulating, lungs clear to auscultation bilaterally no lacerations, septal hematoma, hemotympanums. Other:  follows commands  Medical Decision Making  Medically screening exam initiated at 4:35 PM.  Appropriate orders placed.  [facility] was informed that the remainder of the evaluation will be completed by another provider, this initial triage assessment does not replace that evaluation, and the importance of remaining in the ED until their evaluation is complete.  Unclear etiology of falls, will check basic labs, urine and EKG.    Sherrill Raring, PA-C 05/27/22 1637

## 2022-05-27 NOTE — ED Provider Notes (Cosign Needed Addendum)
Patient was signed out to me by previous provider, please see their note for complete history.    Patient has dementia, lives in a memory care in independent living.  Patient's sister who is providing main history.  Patient had an unwitnessed fall yesterday, son at 60 AM this morning on the floor.  At baseline she ambulates with a walker, today she she is ambulatory at baseline gait.  No traumatic findings on exam, patient is at her baseline mentally.  There was concern about possible UTI given recent infections.    Patient care was signed out pending UA with disposition most likely to send the patient back to the facility.  I evaluated the patient, there are no focal deficits, lungs clear to oscillation bilaterally and abdomen is soft nontender.  Vital signs are also reassuring although mildly hypertensive at 139/59.   I reviewed laboratory work-up, patient is hypokalemic at 2.8.  Previous provider ordered oral K-Dur, patient is tolerating the pill without any difficulty.  No gross electrolyte derangement otherwise, no AKI.  CBC without leukocytosis, leukopenia or underlying anemia.    Imaging of head, C-spine, plain film of chest and pelvis are all negative.  Patient is also ambulatory and able to urinate on her own.  Initially plan was to send patient home after UA.  Patient's family is concerned about sending patient home to facility and is requesting we keep her overnight to watch her in the hospital.    I do not feel it needs admission for an isolated hypokalemia.  Discussed with my attending who also evaluated the patient.  We have ordered [facility] consult for home health needs, PT and OT eval and reordered home medicine. Patient will board in the ED until evaluation in the morning.   Discussed HPI, physical exam and plan of care for this patient with attending Ankit Nanavati. The attending physician evaluated this patient as part of a shared visit and agrees with plan of care.   Sherrill Raring,  PA-C 05/27/22 2342    Sherrill Raring, PA-C 05/27/22 Darci Needle    Varney Biles, MD 05/28/22 1428    Varney Biles, MD 05/28/22 1443

## 2022-05-28 ENCOUNTER — Encounter (HOSPITAL_COMMUNITY): Payer: Self-pay | Admitting: Internal Medicine

## 2022-05-28 ENCOUNTER — Other Ambulatory Visit: Payer: Self-pay

## 2022-05-28 DIAGNOSIS — U071 COVID-19: Secondary | ICD-10-CM | POA: Diagnosis present

## 2022-05-28 DIAGNOSIS — E890 Postprocedural hypothyroidism: Secondary | ICD-10-CM | POA: Insufficient documentation

## 2022-05-28 DIAGNOSIS — E162 Hypoglycemia, unspecified: Secondary | ICD-10-CM | POA: Diagnosis present

## 2022-05-28 LAB — BASIC METABOLIC PANEL
Anion gap: 7 (ref 5–15)
BUN: 16 mg/dL (ref 8–23)
CO2: 19 mmol/L — ABNORMAL LOW (ref 22–32)
Calcium: 8.6 mg/dL — ABNORMAL LOW (ref 8.9–10.3)
Chloride: 115 mmol/L — ABNORMAL HIGH (ref 98–111)
Creatinine, Ser: 0.55 mg/dL (ref 0.44–1.00)
GFR, Estimated: >60 mL/min (ref >60)
Glucose, Bld: 101 mg/dL — ABNORMAL HIGH (ref 70–99)
Potassium: 3.5 mmol/L (ref 3.5–5.1)
Sodium: 141 mmol/L (ref 135–145)

## 2022-05-28 LAB — C-REACTIVE PROTEIN: CRP: 1.5 mg/dL — ABNORMAL HIGH (ref <1.0)

## 2022-05-28 LAB — GLUCOSE, CAPILLARY
Glucose-Capillary: 69 mg/dL — ABNORMAL LOW (ref 70–99)
Glucose-Capillary: 79 mg/dL (ref 70–99)
Glucose-Capillary: 93 mg/dL (ref 70–99)

## 2022-05-28 LAB — RESP PANEL BY RT-PCR (FLU A&B, COVID) ARPGX2
Influenza A by PCR: NEGATIVE
Influenza B by PCR: NEGATIVE
SARS Coronavirus 2 by RT PCR: POSITIVE — AB

## 2022-05-28 LAB — MAGNESIUM: Magnesium: 1.7 mg/dL (ref 1.7–2.4)

## 2022-05-28 LAB — FERRITIN: Ferritin: 67 ng/mL (ref 11–307)

## 2022-05-28 MED ORDER — LOPERAMIDE HCL 2 MG PO CAPS
2.0000 mg | ORAL_CAPSULE | Freq: Once | ORAL | Status: AC
  Administered 2022-05-28: 2 mg via ORAL
  Filled 2022-05-28: qty 1

## 2022-05-28 MED ORDER — ACETAMINOPHEN 325 MG PO TABS: 650.0000 mg | ORAL_TABLET | Freq: Four times a day (QID) | ORAL | Status: DC | PRN

## 2022-05-28 MED ORDER — ASCORBIC ACID 500 MG PO TABS
500.0000 mg | ORAL_TABLET | Freq: Every day | ORAL | Status: DC
  Administered 2022-05-28 – 2022-05-30 (×3): 500 mg via ORAL
  Filled 2022-05-28 (×3): qty 1

## 2022-05-28 MED ORDER — ENOXAPARIN SODIUM 40 MG/0.4ML IJ SOSY: 40.0000 mg | PREFILLED_SYRINGE | Freq: Every day | INTRAMUSCULAR | Status: DC

## 2022-05-28 MED ORDER — ACETAMINOPHEN 650 MG RE SUPP: 650.0000 mg | Freq: Four times a day (QID) | RECTAL | Status: DC | PRN

## 2022-05-28 MED ORDER — DIPHENHYDRAMINE-ZINC ACETATE 2-0.1 % EX CREA
TOPICAL_CREAM | Freq: Three times a day (TID) | CUTANEOUS | Status: DC
  Filled 2022-05-28: qty 28

## 2022-05-28 MED ORDER — ONDANSETRON HCL 4 MG PO TABS: 4.0000 mg | ORAL_TABLET | Freq: Four times a day (QID) | ORAL | Status: DC | PRN

## 2022-05-28 MED ORDER — ZINC SULFATE 220 (50 ZN) MG PO CAPS
220.0000 mg | ORAL_CAPSULE | Freq: Every day | ORAL | Status: DC
  Administered 2022-05-28 – 2022-05-30 (×3): 220 mg via ORAL
  Filled 2022-05-28 (×3): qty 1

## 2022-05-28 MED ORDER — DEXTROSE 50 % IV SOLN: 12.5000 g | INTRAVENOUS | Status: DC | PRN

## 2022-05-28 MED ORDER — POTASSIUM CHLORIDE IN NACL 40-0.9 MEQ/L-% IV SOLN
INTRAVENOUS | Status: AC
  Filled 2022-05-28: qty 1000

## 2022-05-28 MED ORDER — ONDANSETRON HCL 4 MG/2ML IJ SOLN
4.0000 mg | Freq: Four times a day (QID) | INTRAMUSCULAR | Status: DC | PRN
  Administered 2022-05-29: 4 mg via INTRAVENOUS
  Filled 2022-05-28: qty 2

## 2022-05-28 MED ORDER — NIRMATRELVIR/RITONAVIR (PAXLOVID)TABLET
3.0000 | ORAL_TABLET | Freq: Two times a day (BID) | ORAL | Status: DC
  Administered 2022-05-28 – 2022-05-30 (×4): 3 via ORAL
  Filled 2022-05-28: qty 30

## 2022-05-28 MED ORDER — POTASSIUM CHLORIDE IN NACL 40-0.9 MEQ/L-% IV SOLN
INTRAVENOUS | Status: DC
  Filled 2022-05-28: qty 1000

## 2022-05-28 NOTE — ED Provider Notes (Signed)
Emergency Medicine Observation Re-evaluation Note  Felicia Kelly is a 86 y.o. female, seen on rounds today.  Pt initially presented to the ED for complaints of Fall Currently, the patient is sleeping.  Physical Exam  BP 126/71   Pulse 73   Temp 99.3 F (37.4 C) (Oral)   Resp (!) 9   Ht '5\' 2"'$  (1.575 m)   Wt 59 kg   SpO2 99%   BMI 23.78 kg/m  Physical Exam General: No acute distress Cardiac: Well-perfused Lungs: Nonlabored Psych: Deferred  ED Course / MDM  EKG:EKG Interpretation  Date/Time:  Saturday May 27 2022 16:37:43 EDT Ventricular Rate:  96 PR Interval:  138 QRS Duration: 76 QT Interval:  348 QTC Calculation: 440 R Axis:   77 Text Interpretation: Sinus tachycardia Multiple premature complexes, vent & supraven Low voltage, precordial leads Anteroseptal infarct, old No acute changes Confirmed by Varney Biles [phone]) on 05/27/2022 10:34:09 PM  I have reviewed the labs performed to date as well as medications administered while in observation.  Recent changes in the last 24 hours include medical evaluation.  Plan  Current plan is for [facility] consult.  [facility] is not under involuntary commitment.     Hayden Rasmussen, MD 05/28/22 1728

## 2022-05-28 NOTE — Progress Notes (Signed)
Hypoglycemic Event  CBG: 69  Treatment: 1 cup of orange juice  Symptoms: No symptoms at this time  Follow-up CBG: Time: 1817 CBG Result: 79  Possible Reasons for Event: Inadequate intake  Comments/MD notified: Notified Dr. Tennis Must, MD at 1750.  New orders: Dextrose 50% IV solution 12.5 g as needed for low blood sugar    Anda Kraft, RN

## 2022-05-28 NOTE — ED Notes (Signed)
Pt attempted to make it to bedside commode but had episode of diarrhea on the floor. Denies previous episodes or other complaints. Pt cleaned, linens changed.

## 2022-05-28 NOTE — Care Management (Signed)
ED CM attempted to contact someone at [facility]. There was no answer or option to leave a message.   EDCM called the patient's sister/POA, Gardiner Ramus. Mrs. Loma Sousa reports the patient currently has PT, OT, and ST provided by [facility]. She reports satisfaction with the home health services the patient has been receiving since May 2023. She uses a cane at home. Mrs. Loma Sousa states she plans to get a rollator walker. We discussed asking the patient's physical therapist to determine whether a rollator is appropriate for the patient prior to purchasing one. She agrees. She reports the patient has hired caregivers who visit her twice daily to administer her medications, take her to meals and hair appointments. The patient is on the waiting list to move from [place] to [place] or Assisted Living. Mrs. Reece plans to speak with the administration again this week. She states she may need to select a nursing home if she cannot get the patient moved into ALF or MC at [facility]. We discussed requesting assistance from a CSW with [facility] if she determines she needs to move the patient to a different facility. The patient's sister and her husband will pick up the patient today at discharge.

## 2022-05-28 NOTE — H&P (Signed)
History and Physical    Patient: Felicia Kelly HCW:[id number] DOB: 06-02-1936 DOA: 05/27/2022 DOS: the patient was seen and examined on 05/28/2022 PCP: Pcp, No  Patient coming from: Home  Chief Complaint:  Chief Complaint  Patient presents with   Fall   HPI: Felicia Kelly is a 86 y.o. female with medical history significant of aortic atherosclerosis, hyperlipidemia, coronary atherosclerosis, carotid artery disease, arthritis, genital herpes, hyper attention, osteoporosis, pulmonary nodules, mold nodular goiter, postablative hypothyroidism, tremors bradycardia, basal cell carcinoma, subarachnoid hemorrhage, renal cysts, subchondral cysts, vitamin D deficiency, dementia was brought to the emergency department due to an unwitnessed fall.  She does not remember the details of what happened due to her dementia.  However, she is able to answer simple questions.  She denied headache, abdominal, back or chest pain.  Denied nausea and stated she is hungry.  In the ER, arrangements were being made for SNF placement, but the patient tested positive for COVID-19, became weaker and developed diarrhea earlier today.  ED course: Initial vital signs were temperature 98 F, pulse 88, respiration 14, BP 118/67 mmHg O2 sat 96% on room air.  The patient received 80 mEq of KCl and her regular medications were ordered.  I added 2 mg of loperamide p.o.  Lab work: Her urinalysis showed moderate hemoglobinuria, ketonuria 5 and proteinuria 100 mg/dL.  There was rare bacteria on microscopic examination.  Her CBC was normal.  BMP showed a potassium of 2.8 mmol/L and a glucose of 105 mg/dL.  Imaging: Pelvis, chest x-ray, CT head and CT cervical spine did not have any acute findings.   Review of Systems: As mentioned in the history of present illness. All other systems reviewed and are negative.  Past Medical History:  Diagnosis Date   Aortic atherosclerosis (Cut Off)    a. noted on CT 01/2018.   Arthritis     Colonic polyp    Coronary atherosclerosis    a. noted on CT 01/2018.   Genital herpes    Hyperlipidemia    Hypertension    Mild carotid artery disease (HCC)    Osteoporosis    Overdose of cardiac medication    Pulmonary nodules    a. followed by PCP by serial Ct.   Thyroid disease    seeing Dr. Buddy Duty   Tremor    Past Surgical History:  Procedure Laterality Date   ABDOMINAL HYSTERECTOMY  1988   no cancer - reports total with bilat oophorectomy   APPENDECTOMY  1988   BREAST CYST EXCISION Bilateral    BREAST SURGERY  1985   biopsy   FOOT SURGERY     multiple sugeries, remote   TOTAL HIP ARTHROPLASTY Bilateral    Social History:  reports that she quit smoking about 43 years ago. Her smoking use included cigarettes. She has never used smokeless tobacco. She reports that she does not drink alcohol and does not use drugs.  Allergies  Allergen Reactions   Tetracycline Itching   Atorvastatin Other (See Comments)    Leg cramps   Bactrim [Sulfamethoxazole-Trimethoprim] Itching   Celecoxib Other (See Comments)    Memory disturbance, slowed thinking    Niacin Other (See Comments) and Hypertension    Caused elevated BP   Rosuvastatin Other (See Comments)    Elevated CPK   Simvastatin Other (See Comments)    Leg cramps    Tetracyclines & Related Itching   Penicillins Swelling, Rash and Other (See Comments)    ALL -CILLINS, pt's sister states "swelling"  reported is related to the rash she gets     Family History  Problem Relation Age of Onset   Pancreatic cancer Father    Heart disease Father    Heart attack Sister 20   Arthritis Mother    Hyperlipidemia Mother    Hypertension Mother    Heart disease Mother    Diabetes Mother    Liver disease Brother        cancer of bile duct   Heart disease Maternal Grandfather    Heart attack Brother    Breast cancer Neg Hx     Prior to Admission medications   Medication Sig Start Date End Date Taking? Authorizing Provider   aspirin EC 81 MG tablet Take 81 mg by mouth every evening. Swallow whole.   Yes [provider]  Cholecalciferol (VITAMIN D-3) 25 MCG (1000 UT) CAPS Take 1,000 Units by mouth every Monday, Wednesday, and Friday.   Yes [provider]  cyanocobalamin 100 MCG tablet Take 100 mcg by mouth daily.   Yes [provider]  ezetimibe (ZETIA) 10 MG tablet Take 1 tablet (10 mg total) by mouth daily. 02/14/22  Yes [name], MD  levothyroxine (SYNTHROID) 50 MCG tablet Take 50-100 mcg by mouth See admin instructions. Take 100 mcg by mouth in the morning before breakfast on Sundays and 50 mcg on Mon/Tues/Wed/Thurs/Fri/Sat   Yes [provider]  Magnesium 250 MG TABS Take 250 mg by mouth daily.   Yes [provider]  melatonin 1 MG TABS tablet Take 1 mg by mouth at bedtime.   Yes [provider]  METAMUCIL FIBER PO Take 1 capsule by mouth every evening.   Yes [provider]  metFORMIN (GLUCOPHAGE-XR) 500 MG 24 hr tablet Take 1 tablet (500 mg total) by mouth daily with breakfast. 02/16/22  Yes Koberlein, Junell C, MD  Multiple Vitamin (MULTIVITAMIN) tablet Take 1 tablet by mouth daily with breakfast.   Yes [provider]  Multiple Vitamins-Minerals ([place]) CAPS Take 1 capsule by mouth in the morning.   Yes [provider]  potassium chloride (KLOR-CON M) 10 MEQ tablet Take 1 tablet (10 mEq total) by mouth daily. 02/27/22  Yes [name], MD  pravastatin (PRAVACHOL) 40 MG tablet TAKE 1 TABLET ONCE DAILY. Patient taking differently: Take 40 mg by mouth every evening. 02/16/22  Yes [name], MD  Probiotic Product (PROBIOTIC PO) Take 1 capsule by mouth every evening.   Yes [provider]  rivastigmine (EXELON) 4.5 MG capsule Take 1 capsule (4.5 mg total) by mouth 2 (two) times daily. 02/27/22  Yes Wertman, Coralee Pesa, PA-C  valACYclovir (VALTREX) 500 MG tablet TAKE ONE TABLET BY MOUTH  ONCE DAILY Patient taking differently: Take 500 mg by mouth daily. 02/16/22  Yes Caren Macadam, MD    Physical Exam: Vitals:   05/27/22 1945 05/27/22 2026 05/27/22 2050 05/27/22 2100  BP: (!) 139/59  126/71 124/67  Pulse: 77  73 79  Resp: (!) 21  (!) 9 20  Temp:  99.3 F (37.4 C)    TempSrc:  Oral    SpO2: 99%  99% 97%  Weight:      Height:       Physical Exam Vitals and nursing note reviewed.  Constitutional:      General: She is awake.     Appearance: Normal appearance.  HENT:     Head: Normocephalic.     Nose: No rhinorrhea.  Mouth/Throat:     Mouth: Mucous membranes are moist.  Eyes:     General: No scleral icterus.    Pupils: Pupils are equal, round, and reactive to light.  Neck:     Vascular: No JVD.  Cardiovascular:     Rate and Rhythm: Normal rate and regular rhythm.  Pulmonary:     Effort: Pulmonary effort is normal.     Breath sounds: Normal breath sounds.  Abdominal:     General: Bowel sounds are normal. There is no distension.     Palpations: Abdomen is soft.     Tenderness: There is no abdominal tenderness. There is no right CVA tenderness, left CVA tenderness or guarding.  Musculoskeletal:     Cervical back: Neck supple.     Right lower leg: No edema.     Left lower leg: No edema.     Comments: Moderate generalized weakness.  Skin:    General: Skin is warm and dry.     Findings: Erythema present.     Comments: There is mild erythema on anterior chest wall.  Neurological:     General: No focal deficit present.     Mental Status: She is alert. She is disoriented.  Psychiatric:        Mood and Affect: Mood normal.        Behavior: Behavior normal. Behavior is cooperative.    Data Reviewed:  There are no new results to review at this time.  Assessment and Plan: Principal Problem:   2019 novel coronavirus disease (COVID-19) No respiratory disease at the moment. Admit to telemetry/inpatient. Supplemental oxygen as  needed. Bronchodilators as needed. Vitamin C/zinc supplementation Paxlovid p.o. twice daily. Follow-up CBC, CMP and inflammatory markers.  Active Problems:   Benign essential hypertension Monitor blood pressure. As needed antihypertensive.    Hypercholesterolemia   Aortic atherosclerosis (HCC) Continue ezetimibe 10 mg p.o. daily. Continue pravastatin 40 mg p.o. daily.    Hypothyroidism Continue levothyroxine 50 mcg p.o. daily.    Hypocalcemia Recheck calcium level in AM.    Hypoglycemia Had not eaten much today. She was given orange juice. Her dinner has been ordered. Discontinue metformin. Dextrose 50% IVP as needed. Monitor CBG.     Advance Care Planning:   Code Status: Full Code   Consults:   Family Communication:   Severity of Illness: The appropriate patient status for this patient is OBSERVATION. Observation status is judged to be reasonable and necessary in order to provide the required intensity of service to ensure the patient's safety. The patient's presenting symptoms, physical exam findings, and initial radiographic and laboratory data in the context of their medical condition is felt to place them at decreased risk for further clinical deterioration. Furthermore, it is anticipated that the patient will be medically stable for discharge from the hospital within 2 midnights of admission.   Author: Reubin Milan, MD 05/28/2022 2:55 PM  For on call review [email or web address].   This document was prepared using Dragon voice recognition software and may contain some unintended transcription errors.

## 2022-05-28 NOTE — Plan of Care (Signed)
  Problem: Education: Goal: Knowledge of General Education information will improve Description Including pain rating scale, medication(s)/side effects and non-pharmacologic comfort measures Outcome: Progressing   

## 2022-05-29 DIAGNOSIS — U071 COVID-19: Secondary | ICD-10-CM | POA: Diagnosis not present

## 2022-05-29 LAB — GLUCOSE, CAPILLARY
Glucose-Capillary: 110 mg/dL — ABNORMAL HIGH (ref 70–99)
Glucose-Capillary: 82 mg/dL (ref 70–99)
Glucose-Capillary: 83 mg/dL (ref 70–99)
Glucose-Capillary: 84 mg/dL (ref 70–99)

## 2022-05-29 LAB — COMPREHENSIVE METABOLIC PANEL
ALT: 84 U/L — ABNORMAL HIGH (ref 0–44)
AST: 63 U/L — ABNORMAL HIGH (ref 15–41)
Albumin: 3.5 g/dL (ref 3.5–5.0)
Alkaline Phosphatase: 52 U/L (ref 38–126)
Anion gap: 7 (ref 5–15)
BUN: 11 mg/dL (ref 8–23)
CO2: 24 mmol/L (ref 22–32)
Calcium: 8.7 mg/dL — ABNORMAL LOW (ref 8.9–10.3)
Chloride: 111 mmol/L (ref 98–111)
Creatinine, Ser: 0.52 mg/dL (ref 0.44–1.00)
GFR, Estimated: >60 mL/min (ref >60)
Glucose, Bld: 100 mg/dL — ABNORMAL HIGH (ref 70–99)
Potassium: 3.6 mmol/L (ref 3.5–5.1)
Sodium: 142 mmol/L (ref 135–145)
Total Bilirubin: 0.5 mg/dL (ref 0.3–1.2)
Total Protein: 6.5 g/dL (ref 6.5–8.1)

## 2022-05-29 LAB — PHOSPHORUS: Phosphorus: 3.2 mg/dL (ref 2.5–4.6)

## 2022-05-29 LAB — CBC WITH DIFFERENTIAL/PLATELET
Abs Immature Granulocytes: 0.01 10*3/uL (ref 0.00–0.07)
Basophils Absolute: 0 10*3/uL (ref 0.0–0.1)
Basophils Relative: 0 %
Eosinophils Absolute: 0.1 10*3/uL (ref 0.0–0.5)
Eosinophils Relative: 1 %
HCT: 37 % (ref 36.0–46.0)
Hemoglobin: 12.1 g/dL (ref 12.0–15.0)
Immature Granulocytes: 0 %
Lymphocytes Relative: 21 %
Lymphs Abs: 1.1 10*3/uL (ref 0.7–4.0)
MCH: 30.4 pg (ref 26.0–34.0)
MCHC: 32.7 g/dL (ref 30.0–36.0)
MCV: 93 fL (ref 80.0–100.0)
Monocytes Absolute: 0.4 10*3/uL (ref 0.1–1.0)
Monocytes Relative: 8 %
Neutro Abs: 3.6 10*3/uL (ref 1.7–7.7)
Neutrophils Relative %: 70 %
Platelets: 186 10*3/uL (ref 150–400)
RBC: 3.98 MIL/uL (ref 3.87–5.11)
RDW: 13.3 % (ref 11.5–15.5)
WBC: 5.2 10*3/uL (ref 4.0–10.5)
nRBC: 0 % (ref 0.0–0.2)

## 2022-05-29 LAB — C-REACTIVE PROTEIN: CRP: 1.3 mg/dL — ABNORMAL HIGH (ref <1.0)

## 2022-05-29 LAB — D-DIMER, QUANTITATIVE: D-Dimer, Quant: 0.87 ug/mL-FEU — ABNORMAL HIGH (ref 0.00–0.50)

## 2022-05-29 LAB — FERRITIN: Ferritin: 66 ng/mL (ref 11–307)

## 2022-05-29 LAB — MAGNESIUM: Magnesium: 1.9 mg/dL (ref 1.7–2.4)

## 2022-05-29 NOTE — Hospital Course (Addendum)
86 y.o.f w/ medical history significant of aortic atherosclerosis, hyperlipidemia, coronary atherosclerosis, carotid artery disease, arthritis, genital herpes, hyper attention, osteoporosis, pulmonary nodules, mold nodular goiter, postablative hypothyroidism, tremors bradycardia, basal cell carcinoma, subarachnoid hemorrhage, renal cysts, subchondral cysts, vitamin D deficiency, dementia was brought to the ED due to an unwitnessed fall.  She does not remember the details of what happened due to her dementia.  However, she is able to answer simple questions.In the ER, arrangements were being made for SNF placement, but the patient tested positive for COVID-19, became weaker and developed diarrhea>UA-moderate hemoglobinuria, ketonuria 5 and proteinuria 100 mg/dL. There was rare bacteria on microscopic examination.  Her CBC was normal.  BMP-potassium 2.8. Pelvis, CXR,CT head and CT cervical spine did not have any acute findings.  Patient was placed on Paxlovid

## 2022-05-29 NOTE — Care Management Obs Status (Signed)
[facility] NOTIFICATION   Patient Details  Name: Felicia Kelly MRN: [id number] Date of Birth: Nov 30, 1935   Medicare Observation Status Notification Given:  Yes    Vassie Moselle, LCSW 05/29/2022, 10:59 AM

## 2022-05-29 NOTE — TOC Initial Note (Signed)
Transition of Care [facility]) - Initial/Assessment Note    Patient Details  Name: Felicia Kelly MRN: [id number] Date of Birth: October 31, 1935  [facility] CM/SW Contact:    Vassie Moselle, LCSW Phone Number: 05/29/2022, 12:01 PM  Clinical Narrative:                 Pt has been living at [place] and has been receiving HHPT/OT/ST from [facility]. Current plan is for pt to return and resume current [facility] services. Pt's orientation has been fluctuating and her sister, Gardiner Ramus is pt's main point of contact and POA. TOC will continue to follow for discharge needs  Expected Discharge Plan: [place] Barriers to Discharge: No Barriers Identified   Patient Goals and CMS Choice Patient states their goals for this hospitalization and ongoing recovery are:: To return home   Choice offered to / list presented to : Patient  Expected Discharge Plan and Services Expected Discharge Plan: [place] In-house Referral: NA Discharge Planning Services: NA Post Acute Care Choice: [place] arrangements for the past 2 months: [place]                 DME Arranged: N/A DME Agency: NA       HH Arranged: PT, OT, Speech Therapy [place] Agency: Other - See comment Secondary school teacher)        Prior Living Arrangements/Services Living arrangements for the past 2 months: [place] Lives with:: Self Patient language and need for interpreter reviewed:: Yes Do you feel safe going back to the place where you live?: Yes      Need for Family Participation in Patient Care: Yes (Comment) Care giver support system in place?: Yes (comment) Current home services: DME, Home OT, Home PT Criminal Activity/Legal Involvement Pertinent to Current Situation/Hospitalization: No - Comment as needed  Activities of Daily Living Home Assistive Devices/Equipment: Cane (specify quad or straight) ADL Screening (condition  at time of admission) Patient's cognitive ability adequate to safely complete daily activities?: Yes Is the patient deaf or have difficulty hearing?: No Does the patient have difficulty seeing, even when wearing glasses/contacts?: No Does the patient have difficulty concentrating, remembering, or making decisions?: No Patient able to express need for assistance with ADLs?: Yes Does the patient have difficulty dressing or bathing?: Yes Independently performs ADLs?: No Communication: Needs assistance Is this a change from baseline?: Change from baseline, expected to last >3 days Dressing (OT): Needs assistance Is this a change from baseline?: Change from baseline, expected to last >3 days Grooming: Independent, Needs assistance Is this a change from baseline?: Change from baseline, expected to last >3 days Feeding: Needs assistance Is this a change from baseline?: Change from baseline, expected to last >3 days Bathing: Needs assistance Is this a change from baseline?: Change from baseline, expected to last >3 days Toileting: Needs assistance Is this a change from baseline?: Change from baseline, expected to last >3days In/Out Bed: Needs assistance Is this a change from baseline?: Change from baseline, expected to last >3 days Walks in Home: Needs assistance Is this a change from baseline?: Change from baseline, expected to last >3 days Does the patient have difficulty walking or climbing stairs?: No Weakness of Legs: Both Weakness of Arms/Hands: None  Permission Sought/Granted Permission sought to share information with : Family Supports, Chartered certified accountant granted to share information with : Yes, Verbal Permission Granted  Share Information with  NAME: Gardiner Ramus     Permission granted to share info w Relationship: Sister  Permission granted to share info w Contact Information: [phone]  Emotional Assessment Appearance:: Appears stated  age Attitude/Demeanor/Rapport: Charismatic Affect (typically observed): Pleasant Orientation: : Oriented to Self, Oriented to Place Alcohol / Substance Use: Not Applicable Psych Involvement: No (comment)  Admission diagnosis:  Hypokalemia [E87.6] 2019 novel coronavirus disease (COVID-19) [U07.1] Patient Active Problem List   Diagnosis Date Noted   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   2019 novel coronavirus disease (COVID-19) 05/28/2022   Hypocalcemia 05/28/2022   Hypoglycemia 05/28/2022   SAH (subarachnoid hemorrhage) ([place]) 02/24/2020   Bradycardia 04/01/2019   Cystocele 02/13/2019   Hip pain 02/13/2019   Osteoarthritis of hip 02/13/2019   Subchondral cysts 02/13/2019   Vitamin D deficiency 02/13/2019   Aortic atherosclerosis ([place]) 10/14/2017   Solitary pulmonary nodule 10/23/2016   Hyperglycemia 07/11/2016   Hypercholesterolemia 06/04/2013   Hypothyroidism 06/04/2013   Osteopenia 06/27/2012   Malignant basal cell neoplasm of skin 02/14/2012   Benign essential hypertension 11/22/2011   Internal carotid artery stenosis 09/18/2011   Multinodular goiter 10/16/2009   Herpes simplex type 2 infection 10/05/2008   Single renal cyst 02/14/2008   PCP:  Merryl Hacker, No Pharmacy:   [place], [place] [address] Phone: [phone] Fax: [phone]     Social Determinants of Health (SDOH) Interventions    Readmission Risk Interventions    05/29/2022   11:58 AM  Readmission Risk Prevention Plan  Transportation Screening Complete  PCP or Specialist Appt within 5-7 Days Complete  Home Care Screening Complete  Medication Review (RN CM) Complete

## 2022-05-29 NOTE — Evaluation (Signed)
Occupational Therapy Evaluation Patient Details Name: Felicia Kelly MRN: [id number] DOB: 04/15/1936 Today's Date: 05/29/2022   History of Present Illness Patient is a 86 year old female who presented to the hosptial after a fall. patient was found to have COVID 19. PHM: dementia, CAD, basal cell cancer, OA and tremors   Clinical Impression   Patient is a 86 year old female who was admitted for above. Patient was noted to have limited eval with patient producing emesis and then wanting to get back into bed. Patients nurse called into room. Patient was min A to take few steps to [facility]. Patient would benefit from increased caregiver assistance in next level of care. Patient was noted to have decreased functional activity tolerance, decreased endurance, decreased standing balance, decreased safety awareness, and decreased knowledge of AD/AE impacting participation in ADLs. Patient would continue to benefit from skilled OT services at this time while admitted and after d/c to address noted deficits in order to improve overall safety and independence in ADLs.        Recommendations for follow up therapy are one component of a multi-disciplinary discharge planning process, led by the attending physician.  Recommendations may be updated based on patient status, additional functional criteria and insurance authorization.   Follow Up Recommendations  Home health OT    Assistance Recommended at Discharge Frequent or constant Supervision/Assistance  Patient can return home with the following A little help with walking and/or transfers;A little help with bathing/dressing/bathroom;Direct supervision/assist for financial management;Assist for transportation;Help with stairs or ramp for entrance;Direct supervision/assist for medications management;Assistance with cooking/housework    Functional Status Assessment  Patient has had a recent decline in their functional status and demonstrates the ability to  make significant improvements in function in a reasonable and predictable amount of time.  Equipment Recommendations  None recommended by OT    Recommendations for Other Services       Precautions / Restrictions Precautions Precautions: Fall Restrictions Weight Bearing Restrictions: No      Mobility Bed Mobility Overal bed mobility: Needs Assistance Bed Mobility: Supine to Sit     Supine to sit: Min assist, HOB elevated     General bed mobility comments: Assist for trunk to upright. Increased time. Cues required.    Transfers                          Balance Overall balance assessment: Needs assistance, History of Falls         Standing balance support: Reliant on assistive device for balance, During functional activity Standing balance-Leahy Scale: Poor                             ADL either performed or assessed with clinical judgement   ADL Overall ADL's : Needs assistance/impaired Eating/Feeding: Set up;Sitting Eating/Feeding Details (indicate cue type and reason): patient was noted to have eaten little that was promptly voided via emesis upon sitting  EOB Grooming: Set up;Sitting;Oral care Grooming Details (indicate cue type and reason): after producing emesis Upper Body Bathing: Sitting;Set up   Lower Body Bathing: Minimal assistance;Sit to/from stand;Sitting/lateral leans   Upper Body Dressing : Set up;Sitting   Lower Body Dressing: Minimal assistance;Sit to/from stand;Sitting/lateral leans   Toilet Transfer: Minimal assistance Toilet Transfer Details (indicate cue type and reason): patient was min A to take few stept to head of bed with HHA to get in better position. Toileting-  Clothing Manipulation and Hygiene: Maximal assistance;Sit to/from stand               Vision Patient Visual Report: No change from baseline       Perception     Praxis      Pertinent Vitals/Pain Pain Assessment Pain Assessment: No/denies  pain     Hand Dominance     Extremity/Trunk Assessment Upper Extremity Assessment Upper Extremity Assessment: Overall WFL for tasks assessed   Lower Extremity Assessment Lower Extremity Assessment: Defer to PT evaluation   Cervical / Trunk Assessment Cervical / Trunk Assessment: Normal   Communication Communication Communication: No difficulties   Cognition Arousal/Alertness: Awake/alert Behavior During Therapy: WFL for tasks assessed/performed Overall Cognitive Status: History of cognitive impairments - at baseline                                 General Comments: follows commands well. patient was noted to repeat herself multiple times during session.     General Comments       Exercises     Shoulder Instructions      Home Living Family/patient expects to be discharged to:: Private residence Living Arrangements: Alone Available Help at Discharge: Family;Available PRN/intermittently Type of Home: Apartment Home Access: Level entry     Home Layout: One level               Home Equipment: Cane - single point          Prior Functioning/Environment Prior Level of Function : Independent/Modified Independent             Mobility Comments: uses cane for ambulation          OT Problem List: Decreased activity tolerance;Impaired balance (sitting and/or standing);Decreased safety awareness;Cardiopulmonary status limiting activity;Decreased knowledge of precautions;Decreased knowledge of use of DME or AE      OT Treatment/Interventions: Self-care/ADL training;Therapeutic exercise;Neuromuscular education;Energy conservation;DME and/or AE instruction;Therapeutic activities;Balance training;Patient/family education    OT Goals(Current goals can be found in the care plan section) Acute Rehab OT Goals Patient Stated Goal: to get back in bed OT Goal Formulation: With patient Time For Goal Achievement: 06/12/22 Potential to Achieve Goals: Fair   OT Frequency: Min 2X/week    Co-evaluation              AM-PAC OT "6 Clicks" Daily Activity     Outcome Measure Help from another person eating meals?: None Help from another person taking care of personal grooming?: A Little Help from another person toileting, which includes using toliet, bedpan, or urinal?: A Lot Help from another person bathing (including washing, rinsing, drying)?: A Lot Help from another person to put on and taking off regular upper body clothing?: A Little Help from another person to put on and taking off regular lower body clothing?: A Lot 6 Click Score: 16   End of Session Equipment Utilized During Treatment: Gait belt Nurse Communication: Other (comment) (nurse in room)  Activity Tolerance: Other (comment) (patient started producing emesis) Patient left: in bed;with nursing/sitter in room;with bed alarm set;with call bell/phone within reach  OT Visit Diagnosis: Unsteadiness on feet (R26.81);Other abnormalities of gait and mobility (R26.89);Muscle weakness (generalized) (M62.81)                Time: 3299-2426 OT Time Calculation (min): 17 min Charges:  OT General Charges $OT Visit: 1 Visit OT Evaluation $OT Eval Moderate Complexity: 1 Mod  Oddie Kuhlmann  Marlin Canary, MS Acute Rehabilitation Department Office# [phone] Pager# [phone]   Marcellina Millin 05/29/2022, 3:22 PM

## 2022-05-29 NOTE — Care Management CC44 (Signed)
Condition Code 44 Documentation Completed  Patient Details  Name: Felicia Kelly MRN: [id number] Date of Birth: Jan 03, 1936   Condition Code 44 given:  Yes Patient signature on Condition Code 44 notice:  Yes (Verbal signature) Documentation of 2 MD's agreement:  Yes Code 44 added to claim:  Yes    Vassie Moselle, LCSW 05/29/2022, 10:59 AM

## 2022-05-29 NOTE — Evaluation (Signed)
Physical Therapy Evaluation Patient Details Name: Felicia Kelly MRN: [id number] DOB: October 12, 1936 Today's Date: 05/29/2022  History of Present Illness  86 yo female admitted with fall, COVID. Hx of dementia, CAD, basal cell ca, OA, THA, tremors  Clinical Impression   On eval, pt was Min guard-Min A for mobility. She walked ~230 feet with a RW. Pt tolerated activity well. She has dementia at baseline-she tends to repeat herself and ask the same questions over and over. She participated well. Will continue to follow and progress activity. No family present during this session. PT recommendation is for HHPT at this time.      Recommendations for follow up therapy are one component of a multi-disciplinary discharge planning process, led by the attending physician.  Recommendations may be updated based on patient status, additional functional criteria and insurance authorization.  Follow Up Recommendations Home health PT      Assistance Recommended at Discharge Intermittent Supervision/Assistance  Patient can return home with the following  A little help with walking and/or transfers;Assist for transportation;Assistance with cooking/housework;Help with stairs or ramp for entrance    Equipment Recommendations  (continuing to assess)  Recommendations for Other Services  OT consult    Functional Status Assessment Patient has had a recent decline in their functional status and demonstrates the ability to make significant improvements in function in a reasonable and predictable amount of time.     Precautions / Restrictions Precautions Precautions: Fall Restrictions Weight Bearing Restrictions: No      Mobility  Bed Mobility Overal bed mobility: Needs Assistance Bed Mobility: Supine to Sit     Supine to sit: Min assist, HOB elevated     General bed mobility comments: Assist for trunk to upright. Increased time. Cues required.    Transfers Overall transfer level: Needs  assistance Equipment used: Rolling walker (2 wheels) Transfers: Sit to/from Stand Sit to Stand: Min guard           General transfer comment: Min guard for safety. Cues for safety, hand placement.    Ambulation/Gait Ambulation/Gait assistance: Min guard Gait Distance (Feet): 230 Feet Assistive device: Rolling walker (2 wheels) Gait Pattern/deviations: Step-through pattern, Decreased stride length       General Gait Details: Min guard for safety. Pt denied dizziness, dyspnea. She tolerated distance well. Will need to assess gait with cane on next session.  Stairs            Wheelchair Mobility    Modified Rankin (Stroke Patients Only)       Balance Overall balance assessment: Needs assistance, History of Falls         Standing balance support: Reliant on assistive device for balance, During functional activity Standing balance-Leahy Scale: Fair                               Pertinent Vitals/Pain Pain Assessment Pain Assessment: No/denies pain    Home Living Family/patient expects to be discharged to:: Private residence Living Arrangements: Alone Available Help at Discharge: Family;Available PRN/intermittently Type of Home: Apartment Home Access: Level entry       Home Layout: One level Home Equipment: Cane - single point      Prior Function Prior Level of Function : Independent/Modified Independent             Mobility Comments: uses cane for ambulation       Hand Dominance        Extremity/Trunk Assessment  Upper Extremity Assessment Upper Extremity Assessment: Defer to OT evaluation    Lower Extremity Assessment Lower Extremity Assessment: Generalized weakness    Cervical / Trunk Assessment Cervical / Trunk Assessment: Normal  Communication   Communication: No difficulties  Cognition Arousal/Alertness: Awake/alert Behavior During Therapy: WFL for tasks assessed/performed Overall Cognitive Status: History of  cognitive impairments - at baseline                                 General Comments: follows commands well. pleasant        General Comments      Exercises     Assessment/Plan    PT Assessment Patient needs continued PT services  PT Problem List Decreased strength;Decreased mobility;Decreased activity tolerance;Decreased balance;Decreased knowledge of use of DME       PT Treatment Interventions DME instruction;Gait training;Therapeutic activities;Therapeutic exercise;Patient/family education;Balance training;Functional mobility training    PT Goals (Current goals can be found in the Care Plan section)  Acute Rehab PT Goals Patient Stated Goal: home soon PT Goal Formulation: With patient Time For Goal Achievement: 06/12/22 Potential to Achieve Goals: Good    Frequency Min 3X/week     Co-evaluation               AM-PAC PT "6 Clicks" Mobility  Outcome Measure Help needed turning from your back to your side while in a flat bed without using bedrails?: None Help needed moving from lying on your back to sitting on the side of a flat bed without using bedrails?: A Little Help needed moving to and from a bed to a chair (including a wheelchair)?: A Little Help needed standing up from a chair using your arms (e.g., wheelchair or bedside chair)?: A Little Help needed to walk in hospital room?: A Little Help needed climbing 3-5 steps with a railing? : A Little 6 Click Score: 19    End of Session Equipment Utilized During Treatment: Gait belt Activity Tolerance: Patient tolerated treatment well Patient left: in bed;with call bell/phone within reach;with bed alarm set   PT Visit Diagnosis: Muscle weakness (generalized) (M62.81)    Time: 1103-1594 PT Time Calculation (min) (ACUTE ONLY): 13 min   Charges:   PT Evaluation $PT Eval Low Complexity: [place], PT Acute Rehabilitation  Office: [phone] Pager: [phone]

## 2022-05-29 NOTE — Progress Notes (Signed)
PROGRESS NOTE Felicia Kelly  NFA:[id number] DOB: [birth year] DOA: 05/27/2022 PCP: Pcp, No   Brief Narrative/Hospital Course: 86 y.o.f w/ medical history significant of aortic atherosclerosis, hyperlipidemia, coronary atherosclerosis, carotid artery disease, arthritis, genital herpes, hyper attention, osteoporosis, pulmonary nodules, mold nodular goiter, postablative hypothyroidism, tremors bradycardia, basal cell carcinoma, subarachnoid hemorrhage, renal cysts, subchondral cysts, vitamin D deficiency, dementia was brought to the ED due to an unwitnessed fall.  She does not remember the details of what happened due to her dementia.  However, she is able to answer simple questions.In the ER, arrangements were being made for SNF placement, but the patient tested positive for COVID-19, became weaker and developed diarrhea>UA-moderate hemoglobinuria, ketonuria 5 and proteinuria 100 mg/dL. There was rare bacteria on microscopic examination.  Her CBC was normal.  BMP-potassium 2.8. Pelvis, CXR,CT head and CT cervical spine did not have any acute findings.  Patient was placed on Paxlovid      Subjective: Seen and examined, with baseline dementia no respiratory problems, on room air Trying to eat her breakfast.  Assessment and Plan: Principal Problem:   2019 novel coronavirus disease (COVID-19) Active Problems:   Benign essential hypertension   Hypercholesterolemia   Hypothyroidism   Aortic atherosclerosis ([place])   Hypocalcemia   Hypoglycemia   2019 novel coronavirus disease: No respiratory symptoms, chest x-ray clear, CRP stable at 1.5 with D-dimer 0.8.  Continue vitamin C Zinc,Paxlovid monitor inflammatory markers, continue supportive care PT OT  Mild transaminitis:Monitor  Benign essential hypertension:BP stable, not on medication  Hypothyroidism: Continue Synthroid  HLD Aortic atherosclerosis: Continue Zetia pravastatin  Hypocalcemia-ca stable. Hypoglycemia: Metformin stopped.  Encourage p.o. if persistent add ivf w/ D5 Hypokalemia: Potassium improved  Deconditioning/debility: PT OT eval, TOC consult for placement  DVT prophylaxis: SCDs Start: 05/28/22 1806 Code Status:   Code Status: Full Code Family Communication: plan of care discussed with patient at bedside. Patient status is: Observation because of placement Level of care: Telemetry   Dispo: The patient is from: home            Anticipated disposition: SNF.  Mobility Assessment (last 72 hours)     Mobility Assessment     Row Name 05/29/22 0915 05/28/22 2010 05/28/22 1736       Does patient have an order for bedrest or is patient medically unstable No - Continue assessment No - Continue assessment No - Continue assessment     What is the highest level of mobility based on the progressive mobility assessment? Level 5 (Walks with assist in room/hall) - Balance while stepping forward/back and can walk in room with assist - Complete Level 5 (Walks with assist in room/hall) - Balance while stepping forward/back and can walk in room with assist - Complete Level 5 (Walks with assist in room/hall) - Balance while stepping forward/back and can walk in room with assist - Complete               Objective: Vitals last 24 hrs: Vitals:   05/28/22 1703 05/28/22 2104 05/29/22 0124 05/29/22 0518  BP: (!) 117/54 (!) 148/68 (!) 168/76 (!) 167/73  Pulse: 71 74 85 76  Resp: '14 20 20 20  '$ Temp: 98.2 F (36.8 C) 99.1 F (37.3 C) 99.1 F (37.3 C) 98.1 F (36.7 C)  TempSrc: Oral     SpO2: 98% 100% 98% 98%  Weight: 60.4 kg     Height: '5\' 2"'$  (1.575 m)      Weight change: 1.433 kg  Physical Examination: General exam: alert  awake,older than stated age, weak appearing. HEENT:Oral mucosa moist, Ear/Nose WNL grossly, dentition normal. Respiratory system: bilaterally diminished BS, no use of accessory muscle Cardiovascular system: S1 & S2 +, No JVD. Gastrointestinal system: Abdomen soft,NT,ND, BS+ Nervous  System:Alert, awake, moving extremities and grossly nonfocal Extremities: LE edema neg,distal peripheral pulses palpable.  Skin: No rashes,no icterus. MSK: Normal muscle bulk,tone, power  Medications reviewed:  Scheduled Meds:  vitamin C  500 mg Oral Daily   diphenhydrAMINE-zinc acetate   Topical TID   ezetimibe  10 mg Oral Daily   levothyroxine  100 mcg Oral Once per day on Sun   levothyroxine  50 mcg Oral Once per day on Mon Tue Wed Thu Fri Sat   melatonin  3 mg Oral QHS   multivitamin  1 tablet Oral q AM   multivitamin with minerals  1 tablet Oral Q breakfast   nirmatrelvir/ritonavir EUA  3 tablet Oral BID   potassium chloride  10 mEq Oral Daily   pravastatin  40 mg Oral QPM   rivastigmine  4.5 mg Oral BID   cyanocobalamin  100 mcg Oral Daily   zinc sulfate  220 mg Oral Daily   Continuous Infusions:    Diet Order             Diet heart healthy/carb modified Room service appropriate? Yes; Fluid consistency: Thin  Diet effective now                     Intake/Output Summary (Last 24 hours) at 05/29/2022 0953 Last data filed at 05/29/2022 0701 Gross per 24 hour  Intake 1095.6 ml  Output --  Net 1095.6 ml   Net IO Since Admission: 1,095.6 mL [05/29/22 0953]  Wt Readings from Last 3 Encounters:  05/28/22 60.4 kg  05/16/22 60.4 kg  04/21/22 59.3 kg     Unresulted Labs (From admission, onward)     Start     Ordered   05/29/22 0500  Magnesium  Daily at 5am,   R      05/28/22 1450   05/29/22 0500  D-dimer, quantitative  Daily at 5am,   R      05/28/22 1450   05/29/22 0500  Ferritin  Daily at 5am,   R      05/28/22 1450   05/29/22 0500  C-reactive protein  Daily at 5am,   R      05/28/22 1450   05/29/22 0500  Comprehensive metabolic panel  Daily at 5am,   R      05/28/22 1450   05/29/22 0500  CBC with Differential/Platelet  Daily at 5am,   R      05/28/22 1450   05/29/22 0500  Phosphorus  Daily at 5am,   R      05/28/22 1450          Data Reviewed: I  have personally reviewed following labs and imaging studies CBC: Recent Labs  Lab 05/27/22 1720 05/29/22 0511  WBC 6.3 5.2  NEUTROABS 4.3 3.6  HGB 12.3 12.1  HCT 37.9 37.0  MCV 91.8 93.0  PLT 218 176   Basic Metabolic Panel: Recent Labs  Lab 05/27/22 1720 05/28/22 1053 05/29/22 0511  NA 141 141 142  K 2.8* 3.5 3.6  CL 109 115* 111  CO2 24 19* 24  GLUCOSE 105* 101* 100*  BUN '20 16 11  '$ CREATININE 0.85 0.55 0.52  CALCIUM 9.4 8.6* 8.7*  MG  --  1.7 1.9  PHOS  --   --  3.2   GFR: Estimated Creatinine Clearance: 44.8 mL/min (by C-G formula based on SCr of 0.52 mg/dL). Liver Function Tests: Recent Labs  Lab 05/29/22 0511  AST 63*  ALT 84*  ALKPHOS 52  BILITOT 0.5  PROT 6.5  ALBUMIN 3.5   No results for input(s): "LIPASE", "AMYLASE" in the last 168 hours. No results for input(s): "AMMONIA" in the last 168 hours. Coagulation Profile: No results for input(s): "INR", "PROTIME" in the last 168 hours. BNP (last 3 results) No results for input(s): "PROBNP" in the last 8760 hours. HbA1C: No results for input(s): "HGBA1C" in the last 72 hours. CBG: Recent Labs  Lab 05/28/22 1740 05/28/22 1817 05/28/22 2101 05/29/22 0743  GLUCAP 69* 79 93 83   Lipid Profile: No results for input(s): "CHOL", "HDL", "LDLCALC", "TRIG", "CHOLHDL", "LDLDIRECT" in the last 72 hours. Thyroid Function Tests: No results for input(s): "TSH", "T4TOTAL", "FREET4", "T3FREE", "THYROIDAB" in the last 72 hours. Sepsis Labs: No results for input(s): "PROCALCITON", "LATICACIDVEN" in the last 168 hours.  Recent Results (from the past 240 hour(s))  Resp Panel by RT-PCR (Flu A&B, Covid) Anterior Nasal Swab     Status: Abnormal   Collection Time: 05/27/22 11:14 PM   Specimen: Anterior Nasal Swab  Result Value Ref Range Status   SARS Coronavirus 2 by RT PCR POSITIVE (A) NEGATIVE Final    Comment: (NOTE) SARS-CoV-2 target nucleic acids are DETECTED.  The SARS-CoV-2 RNA is generally detectable in  upper respiratory specimens during the acute phase of infection. Positive results are indicative of the presence of the identified virus, but do not rule out bacterial infection or co-infection with other pathogens not detected by the test. Clinical correlation with patient history and other diagnostic information is necessary to determine patient infection status. The expected result is Negative.  Fact Sheet for Patients: EntrepreneurPulse.com.au  Fact Sheet for Healthcare Providers: IncredibleEmployment.be  This test is not yet approved or cleared by the [place] FDA and  has been authorized for detection and/or diagnosis of SARS-CoV-2 by FDA under an Emergency Use Authorization (EUA).  This EUA will remain in effect (meaning this test can be used) for the duration of  the COVID-19 declaration under Section 564(b)(1) of the A ct, 21 U.S.C. section 360bbb-3(b)(1), unless the authorization is terminated or revoked sooner.     Influenza A by PCR NEGATIVE NEGATIVE Final   Influenza B by PCR NEGATIVE NEGATIVE Final    Comment: (NOTE) The Xpert Xpress SARS-CoV-2/FLU/RSV plus assay is intended as an aid in the diagnosis of influenza from Nasopharyngeal swab specimens and should not be used as a sole basis for treatment. Nasal washings and aspirates are unacceptable for Xpert Xpress SARS-CoV-2/FLU/RSV testing.  Fact Sheet for Patients: EntrepreneurPulse.com.au  Fact Sheet for Healthcare Providers: IncredibleEmployment.be  This test is not yet approved or cleared by the [place] FDA and has been authorized for detection and/or diagnosis of SARS-CoV-2 by FDA under an Emergency Use Authorization (EUA). This EUA will remain in effect (meaning this test can be used) for the duration of the COVID-19 declaration under Section 564(b)(1) of the Act, 21 U.S.C. section 360bbb-3(b)(1), unless the authorization is  terminated or revoked.  Performed at [facility], [place] [address], [place], [place] [address]     Antimicrobials: Anti-infectives (From admission, onward)    Start     Dose/Rate Route Frequency Ordered Stop   05/28/22 2200  nirmatrelvir/ritonavir EUA (PAXLOVID) 3 tablet        3 tablet Oral 2  times daily 05/28/22 1450 06/02/22 2159      Culture/Microbiology    Component Value Date/Time   SDES URINE, CLEAN CATCH 03/03/2022 1653   SPECREQUEST NONE 03/03/2022 1653   CULT  03/03/2022 1653    NO GROWTH Performed at [facility], [place] [address] [place], [place] [address]    REPTSTATUS 03/05/2022 FINAL 03/03/2022 1653    Other culture-see note  Radiology Studies: DG Pelvis 1-2 Views  Result Date: 05/27/2022 CLINICAL DATA:  Pelvic pain.  Recent fall. EXAM: PELVIS - 1-2 VIEW COMPARISON:  04/08/2014 FINDINGS: There is no evidence of acute fracture or dislocation. Bilateral total hip arthroplasties again noted. No acute bony abnormalities are present. IMPRESSION: No acute abnormality. Electronically Signed   By: Margarette Canada M.D.   On: 05/27/2022 17:29   DG Chest 2 View  Result Date: 05/27/2022 CLINICAL DATA:  Chest pain EXAM: CHEST - 2 VIEW COMPARISON:  02/03/2022 and prior studies FINDINGS: The cardiomediastinal silhouette is unremarkable. Mild peribronchial thickening again noted. There is no evidence of focal airspace disease, pulmonary edema, suspicious pulmonary nodule/mass, pleural effusion, or pneumothorax. No acute bony abnormalities are identified. IMPRESSION: No evidence of acute cardiopulmonary disease. Electronically Signed   By: Margarette Canada M.D.   On: 05/27/2022 17:28   CT Cervical Spine Wo Contrast  Result Date: 05/27/2022 CLINICAL DATA:  Trauma EXAM: CT CERVICAL SPINE WITHOUT CONTRAST TECHNIQUE: Multidetector CT imaging of the cervical spine was performed without intravenous contrast. Multiplanar CT image reconstructions were also generated.  RADIATION DOSE REDUCTION: This exam was performed according to the departmental dose-optimization program which includes automated exposure control, adjustment of the mA and/or kV according to patient size and/or use of iterative reconstruction technique. COMPARISON:  02/18/2020 FINDINGS: Alignment: Alignment of posterior margins of vertebral bodies is unremarkable. Skull base and vertebrae: No recent fracture is seen. There is fusion of C3 and C4 vertebrae. Soft tissues and spinal canal: There is no central spinal stenosis. Disc levels: There is encroachment of neural foramina from C4-C7 levels. Upper chest: Unremarkable. Other: None. IMPRESSION: No recent fracture is seen Cervical spondylosis with encroachment of neural foramina from C4-C7 levels. Electronically Signed   By: Elmer Picker M.D.   On: 05/27/2022 17:18   CT Head Wo Contrast  Result Date: 05/27/2022 CLINICAL DATA:  Trauma EXAM: CT HEAD WITHOUT CONTRAST TECHNIQUE: Contiguous axial images were obtained from the base of the skull through the vertex without intravenous contrast. RADIATION DOSE REDUCTION: This exam was performed according to the departmental dose-optimization program which includes automated exposure control, adjustment of the mA and/or kV according to patient size and/or use of iterative reconstruction technique. COMPARISON:  04/16/2022 FINDINGS: Brain: No acute intracranial findings are seen. There is possible tiny lipoma in the falx. There are no signs of bleeding within the cranium. Cortical sulci are prominent. There is decreased density in periventricular white matter. Vascular: Unremarkable. Skull: No fracture is seen in calvarium. Hyperostosis frontalis interna is seen. Sinuses/Orbits: There is mild mucosal thickening in ethmoid and left maxillary sinuses. Other: None. IMPRESSION: No acute intracranial findings are seen in noncontrast CT brain. Atrophy. Small vessel disease. Mild chronic sinusitis. Electronically Signed    By: Elmer Picker M.D.   On: 05/27/2022 17:14     LOS: 1 day   Antonieta Pert, MD [facility]  05/29/2022, 9:53 AM

## 2022-05-30 ENCOUNTER — Other Ambulatory Visit (HOSPITAL_COMMUNITY): Payer: Self-pay

## 2022-05-30 DIAGNOSIS — U071 COVID-19: Secondary | ICD-10-CM | POA: Diagnosis not present

## 2022-05-30 LAB — COMPREHENSIVE METABOLIC PANEL
ALT: 59 U/L — ABNORMAL HIGH (ref 0–44)
AST: 39 U/L (ref 15–41)
Albumin: 3.3 g/dL — ABNORMAL LOW (ref 3.5–5.0)
Alkaline Phosphatase: 50 U/L (ref 38–126)
Anion gap: 8 (ref 5–15)
BUN: 11 mg/dL (ref 8–23)
CO2: 24 mmol/L (ref 22–32)
Calcium: 9.1 mg/dL (ref 8.9–10.3)
Chloride: 109 mmol/L (ref 98–111)
Creatinine, Ser: 0.56 mg/dL (ref 0.44–1.00)
GFR, Estimated: >60 mL/min (ref >60)
Glucose, Bld: 91 mg/dL (ref 70–99)
Potassium: 3.6 mmol/L (ref 3.5–5.1)
Sodium: 141 mmol/L (ref 135–145)
Total Bilirubin: 0.6 mg/dL (ref 0.3–1.2)
Total Protein: 6.3 g/dL — ABNORMAL LOW (ref 6.5–8.1)

## 2022-05-30 LAB — CBC WITH DIFFERENTIAL/PLATELET
Abs Immature Granulocytes: 0.01 10*3/uL (ref 0.00–0.07)
Basophils Absolute: 0 10*3/uL (ref 0.0–0.1)
Basophils Relative: 0 %
Eosinophils Absolute: 0 10*3/uL (ref 0.0–0.5)
Eosinophils Relative: 0 %
HCT: 38.2 % (ref 36.0–46.0)
Hemoglobin: 12.3 g/dL (ref 12.0–15.0)
Immature Granulocytes: 0 %
Lymphocytes Relative: 28 %
Lymphs Abs: 1.2 10*3/uL (ref 0.7–4.0)
MCH: 29.7 pg (ref 26.0–34.0)
MCHC: 32.2 g/dL (ref 30.0–36.0)
MCV: 92.3 fL (ref 80.0–100.0)
Monocytes Absolute: 0.3 10*3/uL (ref 0.1–1.0)
Monocytes Relative: 6 %
Neutro Abs: 2.9 10*3/uL (ref 1.7–7.7)
Neutrophils Relative %: 66 %
Platelets: 206 10*3/uL (ref 150–400)
RBC: 4.14 MIL/uL (ref 3.87–5.11)
RDW: 13 % (ref 11.5–15.5)
WBC: 4.4 10*3/uL (ref 4.0–10.5)
nRBC: 0 % (ref 0.0–0.2)

## 2022-05-30 MED ORDER — NIRMATRELVIR/RITONAVIR (PAXLOVID)TABLET: 3.0000 | ORAL_TABLET | Freq: Two times a day (BID) | ORAL | 0 refills | Status: AC

## 2022-05-30 MED ORDER — ASCORBIC ACID 500 MG PO TABS
500.0000 mg | ORAL_TABLET | Freq: Every day | ORAL | 0 refills | Status: AC
Start: 2022-05-31 — End: 2022-06-14
  Filled 2022-05-30: qty 14, 14d supply, fill #0

## 2022-05-30 MED ORDER — ZINC SULFATE 220 (50 ZN) MG PO CAPS
220.0000 mg | ORAL_CAPSULE | Freq: Every day | ORAL | 0 refills | Status: AC
Start: 2022-05-31 — End: 2022-06-14
  Filled 2022-05-30: qty 14, 14d supply, fill #0

## 2022-05-30 NOTE — Progress Notes (Signed)
Physical Therapy Treatment Patient Details Name: Felicia Kelly MRN: [id number] DOB: 28-Aug-1936 Today's Date: 05/30/2022   History of Present Illness Patient is a 86 year old female who presented to the hosptial after a fall. patient was found to have COVID 19. PHM: dementia, CAD, basal cell cancer, OA and tremors    PT Comments    Returned to assess pt's gait with straight cane use since this is what she uses at baseline. Pt performed poorly. High fall risk with use of cane. Pt was much safer with use of a rolling walker on yesterday. She was unsure if she had a walker available for use at home so asked [facility] team to get her one. Recommend RW use for ambulation safety. Recommend 24/7 supervision/assist at this time. Will also need to maximize home health services ([place], [place], home health aide). Will continue to follow and progress activity as tolerated.    Recommendations for follow up therapy are one component of a multi-disciplinary discharge planning process, led by the attending physician.  Recommendations may be updated based on patient status, additional functional criteria and insurance authorization.  Follow Up Recommendations  Home health PT     Assistance Recommended at Discharge Frequent or constant Supervision/Assistance  Patient can return home with the following A little help with walking and/or transfers;Assist for transportation;Assistance with cooking/housework;Help with stairs or ramp for entrance   Equipment Recommendations  Rolling walker (2 wheels)    Recommendations for Other Services OT consult     Precautions / Restrictions Precautions Precautions: Fall Restrictions Weight Bearing Restrictions: No     Mobility  Bed Mobility Overal bed mobility: Needs Assistance Bed Mobility: Supine to Sit, Sit to Supine     Supine to sit: Supervision, HOB elevated Sit to supine: Supervision, HOB elevated   General bed mobility comments: Increased time. Cues  required.    Transfers Overall transfer level: Needs assistance Equipment used: Straight cane Transfers: Sit to/from Stand Sit to Stand: Min assist           General transfer comment: unsteady. assist to stabilize. cuse for safety, hand placement    Ambulation/Gait Ambulation/Gait assistance: Min assist Gait Distance (Feet): 200 Feet Assistive device: Straight cane Gait Pattern/deviations: Step-through pattern, Decreased stride length       General Gait Details: High fall risk with use of cane. LOB multiple times. Required assist to stabilize throughout the ambulation distance. O2 95% on RA. Cues provided for safety   Stairs             Wheelchair Mobility    Modified Rankin (Stroke Patients Only)       Balance Overall balance assessment: Needs assistance, History of Falls         Standing balance support: Reliant on assistive device for balance, During functional activity                                Cognition Arousal/Alertness: Awake/alert Behavior During Therapy: WFL for tasks assessed/performed Overall Cognitive Status: History of cognitive impairments - at baseline                                 General Comments: follows commands well. patient was noted to repeat herself multiple times during session.        Exercises      General Comments  Pertinent Vitals/Pain Pain Assessment Pain Assessment: No/denies pain    Home Living                          Prior Function            PT Goals (current goals can now be found in the care plan section) Progress towards PT goals: Progressing toward goals    Frequency    Min 3X/week      PT Plan Current plan remains appropriate    Co-evaluation              AM-PAC PT "6 Clicks" Mobility   Outcome Measure  Help needed turning from your back to your side while in a flat bed without using bedrails?: A Little Help needed moving from  lying on your back to sitting on the side of a flat bed without using bedrails?: A Little Help needed moving to and from a bed to a chair (including a wheelchair)?: A Little Help needed standing up from a chair using your arms (e.g., wheelchair or bedside chair)?: A Little Help needed to walk in hospital room?: A Little Help needed climbing 3-5 steps with a railing? : A Little 6 Click Score: 18    End of Session Equipment Utilized During Treatment: Gait belt Activity Tolerance: Patient tolerated treatment well Patient left: in bed;with call bell/phone within reach;with bed alarm set   PT Visit Diagnosis: Muscle weakness (generalized) (M62.81);Difficulty in walking, not elsewhere classified (R26.2)     Time: 1050-1101 PT Time Calculation (min) (ACUTE ONLY): 11 min  Charges:  $Gait Training: 8-22 mins              Doreatha Massed, PT Acute Rehabilitation  Office: [phone] Pager: [phone]

## 2022-05-30 NOTE — Progress Notes (Signed)
Discharge instructions/AVS placed in discharge packet.  [facility] delivered to pts room.  RN spoke with Joaquim Lai, pts sister, and she has a personal medication aide that assist with pts medication administration.  RN let Joaquim Lai know that AVS has medications listed with details and pt will be sent home with Benadryl cream and Paxlovid.  Joaquim Lai verbalized understanding.  Joaquim Lai also has pts pants at her home.  Pt will be discharged with disposable pants and personal belongings.  Joaquim Lai will call staff when she arrives at hospital entrance.

## 2022-05-30 NOTE — Plan of Care (Signed)
  Problem: Education: Goal: Knowledge of General Education information will improve Description: Including pain rating scale, medication(s)/side effects and non-pharmacologic comfort measures Outcome: Adequate for Discharge   Problem: Health Behavior/Discharge Planning: Goal: Ability to manage health-related needs will improve Outcome: Adequate for Discharge   Problem: Clinical Measurements: Goal: Ability to maintain clinical measurements within normal limits will improve Outcome: Adequate for Discharge Goal: Will remain free from infection Outcome: Adequate for Discharge Goal: Diagnostic test results will improve Outcome: Adequate for Discharge Goal: Respiratory complications will improve Outcome: Adequate for Discharge Goal: Cardiovascular complication will be avoided Outcome: Adequate for Discharge   Problem: Activity: Goal: Risk for activity intolerance will decrease Outcome: Adequate for Discharge   Problem: Nutrition: Goal: Adequate nutrition will be maintained Outcome: Adequate for Discharge   Problem: Coping: Goal: Level of anxiety will decrease Outcome: Adequate for Discharge   Problem: Elimination: Goal: Will not experience complications related to bowel motility Outcome: Adequate for Discharge Goal: Will not experience complications related to urinary retention Outcome: Adequate for Discharge   Problem: Pain Managment: Goal: General experience of comfort will improve Outcome: Adequate for Discharge   Problem: Safety: Goal: Ability to remain free from injury will improve Outcome: Adequate for Discharge   Problem: Skin Integrity: Goal: Risk for impaired skin integrity will decrease Outcome: Adequate for Discharge   Problem: Education: Goal: Knowledge of risk factors and measures for prevention of condition will improve Outcome: Adequate for Discharge   Problem: Coping: Goal: Psychosocial and spiritual needs will be supported Outcome: Adequate for  Discharge   Problem: Respiratory: Goal: Will maintain a patent airway Outcome: Adequate for Discharge Goal: Complications related to the disease process, condition or treatment will be avoided or minimized Outcome: Adequate for Discharge   Problem: Education: Goal: Ability to describe self-care measures that may prevent or decrease complications (Diabetes Survival Skills Education) will improve Outcome: Adequate for Discharge Goal: Individualized Educational Video(s) Outcome: Adequate for Discharge   Problem: Coping: Goal: Ability to adjust to condition or change in health will improve Outcome: Adequate for Discharge   Problem: Fluid Volume: Goal: Ability to maintain a balanced intake and output will improve Outcome: Adequate for Discharge   Problem: Health Behavior/Discharge Planning: Goal: Ability to identify and utilize available resources and services will improve Outcome: Adequate for Discharge Goal: Ability to manage health-related needs will improve Outcome: Adequate for Discharge   Problem: Metabolic: Goal: Ability to maintain appropriate glucose levels will improve Outcome: Adequate for Discharge   Problem: Nutritional: Goal: Maintenance of adequate nutrition will improve Outcome: Adequate for Discharge Goal: Progress toward achieving an optimal weight will improve Outcome: Adequate for Discharge   Problem: Skin Integrity: Goal: Risk for impaired skin integrity will decrease Outcome: Adequate for Discharge   Problem: Tissue Perfusion: Goal: Adequacy of tissue perfusion will improve Outcome: Adequate for Discharge   

## 2022-05-30 NOTE — Discharge Summary (Signed)
Physician Discharge Summary  [facility] TDH:[id number] DOB: 11/27/1935 DOA: 05/27/2022  PCP: Pcp, No  Admit date: 05/27/2022 Discharge date: 05/30/2022 Recommendations for Outpatient Follow-up:  Follow up with PCP in 1 weeks-call for appointment Please obtain BMP/CBC in one week  Discharge Dispo: HOME /W HHPTOT/RN and SLP Discharge Condition: Stable Code Status:   Code Status: Full Code Diet recommendation:  Diet Order             Diet heart healthy/carb modified Room service appropriate? Yes; Fluid consistency: Thin  Diet effective now                    Brief/Interim Summary: 86 y.o.f w/ medical history significant of aortic atherosclerosis, hyperlipidemia, coronary atherosclerosis, carotid artery disease, arthritis, genital herpes, hyper attention, osteoporosis, pulmonary nodules, mold nodular goiter, postablative hypothyroidism, tremors bradycardia, basal cell carcinoma, subarachnoid hemorrhage, renal cysts, subchondral cysts, vitamin D deficiency, dementia was brought to the ED due to an unwitnessed fall.  She does not remember the details of what happened due to her dementia.  However, she is able to answer simple questions.In the ER, arrangements were being made for SNF placement, but the patient tested positive for COVID-19, became weaker and developed diarrhea>UA-moderate hemoglobinuria, ketonuria 5 and proteinuria 100 mg/dL. There was rare bacteria on microscopic examination.  Her CBC was normal.  BMP-potassium 2.8. Pelvis, CXR,CT head and CT cervical spine did not have any acute findings.  Patient was placed on Paxlovid  This time she remains medically stable on room air, seen by PT OT and advised home health PT OT upon discharge. She is stable for discharge. Plan of care discussed with patient's sister/TOC and arranging as much help as possible for discharge back to [place].  Discharge Diagnoses:  Principal Problem:   2019 novel coronavirus disease  (COVID-19) Active Problems:   Benign essential hypertension   Hypercholesterolemia   Hypothyroidism   Aortic atherosclerosis ([place])   Hypocalcemia   Hypoglycemia  2019 novel coronavirus disease:No respiratory symptoms, chest x-ray clear, CRP stable at 1.5 with D-dimer 0.8.  Continue vitamin C Zinc,Paxlovid monitor inflammatory markers, continue supportive care PT OT.  At this time is medically stable   Mild transaminitis:Monitor downtrending, outpatient follow-up advised   Benign essential hypertension:BP stable, not on medication   Hypothyroidism: Continue Synthroid   HLD/Aortic atherosclerosis:Continue Zetia pravastatin   Hypocalcemia-calcium stable. Hypoglycemia:Metformin stopped.Encourage p.o.if persistent add ivf w/ D5 Hypokalemia:Potassium improved.   Deconditioning/debility: PT OT eval, TOC consult for placement  Consults: PT OT Subjective: Aao, resting well. Feels fine going back home.  Discharge Exam: Vitals:   05/29/22 1955 05/30/22 0256  BP: (!) 164/78 (!) 150/72  Pulse: 90 88  Resp: 20 20  Temp: 98.4 F (36.9 C) 98.5 F (36.9 C)  SpO2: 97% 96%   General: Pt is alert, awake, not in acute distress Cardiovascular: RRR, S1/S2 +, no rubs, no gallops Respiratory: CTA bilaterally, no wheezing, no rhonchi Abdominal: Soft, NT, ND, bowel sounds + Extremities: no edema, no cyanosis  Discharge Instructions  Discharge Instructions     Discharge instructions   Complete by: As directed    Follow-up with PCP in 1 week.  Please call call MD or return to ER for similar or worsening recurring problem that brought you to hospital or if any fever,nausea/vomiting,abdominal pain, uncontrolled pain, chest pain,  shortness of breath or any other alarming symptoms.  Please follow-up your doctor as instructed in a week time and call the office for appointment.  Please  avoid alcohol, smoking, or any other illicit substance and maintain healthy habits including taking your  regular medications as prescribed.  You were cared for by a hospitalist during your hospital stay. If you have any questions about your discharge medications or the care you received while you were in the hospital after you are discharged, you can call the unit and ask to speak with the hospitalist on call if the hospitalist that took care of you is not available.  Once you are discharged, your primary care physician will handle any further medical issues. Please note that NO REFILLS for any discharge medications will be authorized once you are discharged, as it is imperative that you return to your primary care physician (or establish a relationship with a primary care physician if you do not have one) for your aftercare needs so that they can reassess your need for medications and monitor your lab values    Person Under Monitoring Name: Felicia Kelly [place] 10 days from positive test date 05/27/2022.  Location: [place] [address]   Infection Prevention Recommendations for Individuals Confirmed to have, or Being Evaluated for, 2019 Novel Coronavirus (COVID-19) Infection Who Receive Care at Home  Individuals who are confirmed to have, or are being evaluated for, COVID-19 should follow the prevention steps below until a healthcare provider or local or state health department says they can return to normal activities.  Stay home except to get medical care You should restrict activities outside your home, except for getting medical care. Do not go to work, school, or public areas, and do not use public transportation or taxis.  Call ahead before visiting your doctor Before your medical appointment, call the healthcare provider and tell them that you have, or are being evaluated for, COVID-19 infection. This will help the healthcare provider's office take steps to keep other people from getting infected. Ask your healthcare provider to call the  local or state health department.  Monitor your symptoms Seek prompt medical attention if your illness is worsening (e.g., difficulty breathing). Before going to your medical appointment, call the healthcare provider and tell them that you have, or are being evaluated for, COVID-19 infection. Ask your healthcare provider to call the local or state health department.  Wear a facemask You should wear a facemask that covers your nose and mouth when you are in the same room with other people and when you visit a healthcare provider. People who live with or visit you should also wear a facemask while they are in the same room with you.  Separate yourself from other people in your home As much as possible, you should stay in a different room from other people in your home. Also, you should use a separate bathroom, if available.  Avoid sharing household items You should not share dishes, drinking glasses, cups, eating utensils, towels, bedding, or other items with other people in your home. After using these items, you should wash them thoroughly with soap and water.  Cover your coughs and sneezes Cover your mouth and nose with a tissue when you cough or sneeze, or you can cough or sneeze into your sleeve. Throw used tissues in a lined trash can, and immediately wash your hands with soap and water for at least 20 seconds or use an alcohol-based hand rub.  Wash your [facility] your hands often and thoroughly with soap and water for at least 20 seconds. You can use an alcohol-based hand  sanitizer if soap and water are not available and if your hands are not visibly dirty. Avoid touching your eyes, nose, and mouth with unwashed hands.   Prevention Steps for Caregivers and Household Members of Individuals Confirmed to have, or Being Evaluated for, COVID-19 Infection Being Cared for in the Home  If you live with, or provide care at home for, a person confirmed to have, or being evaluated for,  COVID-19 infection please follow these guidelines to prevent infection:  Follow healthcare provider's instructions Make sure that you understand and can help the patient follow any healthcare provider instructions for all care.  Provide for the patient's basic needs You should help the patient with basic needs in the home and provide support for getting groceries, prescriptions, and other personal needs.  Monitor the patient's symptoms If they are getting sicker, call his or her medical provider and tell them that the patient has, or is being evaluated for, COVID-19 infection. This will help the healthcare provider's office take steps to keep other people from getting infected. Ask the healthcare provider to call the local or state health department.  Limit the number of people who have contact with the patient If possible, have only one caregiver for the patient. Other household members should stay in another home or place of residence. If this is not possible, they should stay in another room, or be separated from the patient as much as possible. Use a separate bathroom, if available. Restrict visitors who do not have an essential need to be in the home.  Keep older adults, very young children, and other sick people away from the patient Keep older adults, very young children, and those who have compromised immune systems or chronic health conditions away from the patient. This includes people with chronic heart, lung, or kidney conditions, diabetes, and cancer.  Ensure good ventilation Make sure that shared spaces in the home have good air flow, such as from an air conditioner or an opened window, weather permitting.  Wash your hands often Wash your hands often and thoroughly with soap and water for at least 20 seconds. You can use an alcohol based hand sanitizer if soap and water are not available and if your hands are not visibly dirty. Avoid touching your eyes, nose, and mouth  with unwashed hands. Use disposable paper towels to dry your hands. If not available, use dedicated cloth towels and replace them when they become wet.  Wear a facemask and gloves Wear a disposable facemask at all times in the room and gloves when you touch or have contact with the patient's blood, body fluids, and/or secretions or excretions, such as sweat, saliva, sputum, nasal mucus, vomit, urine, or feces.  Ensure the mask fits over your nose and mouth tightly, and do not touch it during use. Throw out disposable facemasks and gloves after using them. Do not reuse. Wash your hands immediately after removing your facemask and gloves. If your personal clothing becomes contaminated, carefully remove clothing and launder. Wash your hands after handling contaminated clothing. Place all used disposable facemasks, gloves, and other waste in a lined container before disposing them with other household waste. Remove gloves and wash your hands immediately after handling these items.  Do not share dishes, glasses, or other household items with the patient Avoid sharing household items. You should not share dishes, drinking glasses, cups, eating utensils, towels, bedding, or other items with a patient who is confirmed to have, or being evaluated for, COVID-19 infection. After  the person uses these items, you should wash them thoroughly with soap and water.  Wash laundry thoroughly Immediately remove and wash clothes or bedding that have blood, body fluids, and/or secretions or excretions, such as sweat, saliva, sputum, nasal mucus, vomit, urine, or feces, on them. Wear gloves when handling laundry from the patient. Read and follow directions on labels of laundry or clothing items and detergent. In general, wash and dry with the warmest temperatures recommended on the label.  Clean all areas the individual has used often Clean all touchable surfaces, such as counters, tabletops, doorknobs, bathroom  fixtures, toilets, phones, keyboards, tablets, and bedside tables, every day. Also, clean any surfaces that may have blood, body fluids, and/or secretions or excretions on them. Wear gloves when cleaning surfaces the patient has come in contact with. Use a diluted bleach solution (e.g., dilute bleach with 1 part bleach and 10 parts water) or a household disinfectant with a label that says EPA-registered for coronaviruses. To make a bleach solution at home, add 1 tablespoon of bleach to 1 quart (4 cups) of water. For a larger supply, add  cup of bleach to 1 gallon (16 cups) of water. Read labels of cleaning products and follow recommendations provided on product labels. Labels contain instructions for safe and effective use of the cleaning product including precautions you should take when applying the product, such as wearing gloves or eye protection and making sure you have good ventilation during use of the product. Remove gloves and wash hands immediately after cleaning.  Monitor yourself for signs and symptoms of illness Caregivers and household members are considered close contacts, should monitor their health, and will be asked to limit movement outside of the home to the extent possible. Follow the monitoring steps for close contacts listed on the symptom monitoring form.   ? If you have additional questions, contact your local health department or call the epidemiologist on call at [phone] (available 24/7). ? This guidance is subject to change. For the most up-to-date guidance from [facility], please refer to their website: [email or web address]   Increase activity slowly   Complete by: As directed       Allergies as of 05/30/2022       Reactions   Tetracycline Itching   Atorvastatin Other (See Comments)   Leg cramps   Bactrim [sulfamethoxazole-trimethoprim] Itching   Celecoxib Other (See Comments)   Memory disturbance, slowed  thinking   Niacin Other (See Comments), Hypertension   Caused elevated BP   Rosuvastatin Other (See Comments)   Elevated CPK   Simvastatin Other (See Comments)   Leg cramps   Tetracyclines & Related Itching   Penicillins Swelling, Rash, Other (See Comments)   ALL -CILLINS, pt's sister states "swelling" reported is related to the rash she gets         Medication List     TAKE these medications    ascorbic acid 500 MG tablet Commonly known as: VITAMIN C Take 1 tablet (500 mg total) by mouth daily for 14 days. Start taking on: May 31, 2022   aspirin EC 81 MG tablet Take 81 mg by mouth every evening. Swallow whole.   cyanocobalamin 100 MCG tablet Take 100 mcg by mouth daily.   ezetimibe 10 MG tablet Commonly known as: ZETIA Take 1 tablet (10 mg total) by mouth daily.   levothyroxine 50 MCG tablet Commonly known as: SYNTHROID Take 50-100 mcg by mouth See admin instructions. Take 100 mcg by mouth in the morning before breakfast on  Sundays and 50 mcg on Mon/Tues/Wed/Thurs/Fri/Sat   Magnesium 250 MG Tabs Take 250 mg by mouth daily.   melatonin 1 MG Tabs tablet Take 1 mg by mouth at bedtime.   METAMUCIL FIBER PO Take 1 capsule by mouth every evening.   metFORMIN 500 MG 24 hr tablet Commonly known as: GLUCOPHAGE-XR Take 1 tablet (500 mg total) by mouth daily with breakfast.   multivitamin tablet Take 1 tablet by mouth daily with breakfast.   nirmatrelvir/ritonavir EUA 20 x 150 MG & 10 x 100MG Tabs Commonly known as: PAXLOVID Take 3 tablets by mouth 2 (two) times daily for 3 days. Patient GFR is 44. Take nirmatrelvir (150 mg) two tablets twice daily for 5 days and ritonavir (100 mg) one tablet twice daily for 5 days. Finish the remaining tablets in the kit   Ocuvite Eye Health Formula Caps Take 1 capsule by mouth in the morning.   potassium chloride 10 MEQ tablet Commonly known as: KLOR-CON M Take 1 tablet (10 mEq total) by mouth daily.   pravastatin 40 MG  tablet Commonly known as: PRAVACHOL TAKE 1 TABLET ONCE DAILY. What changed: when to take this   PROBIOTIC PO Take 1 capsule by mouth every evening.   rivastigmine 4.5 MG capsule Commonly known as: EXELON Take 1 capsule (4.5 mg total) by mouth 2 (two) times daily.   valACYclovir 500 MG tablet Commonly known as: VALTREX TAKE ONE TABLET BY MOUTH ONCE DAILY   Vitamin D-3 25 MCG (1000 UT) Caps Take 1,000 Units by mouth every Monday, Wednesday, and Friday.   zinc sulfate 220 (50 Zn) MG capsule Take 1 capsule (220 mg total) by mouth daily for 14 days. Start taking on: May 31, 2022               Durable Medical Equipment  (From admission, onward)           Start     Ordered   05/30/22 1115  For home use only DME Walker rolling  Once       Question Answer Comment  Walker: With 5 Inch Wheels   Patient needs a walker to treat with the following condition Physical deconditioning      08 /15/23 1114            Follow-up Information     [name], MD Follow up in 1 week(s).   Specialties: Cardiology, Radiology Contact information: [address] [place] [address] [phone]                Allergies  Allergen Reactions   Tetracycline Itching   Atorvastatin Other (See Comments)    Leg cramps   Bactrim [Sulfamethoxazole-Trimethoprim] Itching   Celecoxib Other (See Comments)    Memory disturbance, slowed thinking    Niacin Other (See Comments) and Hypertension    Caused elevated BP   Rosuvastatin Other (See Comments)    Elevated CPK   Simvastatin Other (See Comments)    Leg cramps    Tetracyclines & Related Itching   Penicillins Swelling, Rash and Other (See Comments)    ALL -CILLINS, pt's sister states "swelling" reported is related to the rash she gets     The results of significant diagnostics from this hospitalization (including imaging, microbiology, ancillary and laboratory) are listed below for  reference.    Microbiology: Recent Results (from the past 240 hour(s))  Resp Panel by RT-PCR (Flu A&B, Covid) Anterior Nasal Swab     Status: Abnormal  Collection Time: 05/27/22 11:14 PM   Specimen: Anterior Nasal Swab  Result Value Ref Range Status   SARS Coronavirus 2 by RT PCR POSITIVE (A) NEGATIVE Final    Comment: (NOTE) SARS-CoV-2 target nucleic acids are DETECTED.  The SARS-CoV-2 RNA is generally detectable in upper respiratory specimens during the acute phase of infection. Positive results are indicative of the presence of the identified virus, but do not rule out bacterial infection or co-infection with other pathogens not detected by the test. Clinical correlation with patient history and other diagnostic information is necessary to determine patient infection status. The expected result is Negative.  Fact Sheet for Patients: EntrepreneurPulse.com.au  Fact Sheet for Healthcare Providers: IncredibleEmployment.be  This test is not yet approved or cleared by the [place] FDA and  has been authorized for detection and/or diagnosis of SARS-CoV-2 by FDA under an Emergency Use Authorization (EUA).  This EUA will remain in effect (meaning this test can be used) for the duration of  the COVID-19 declaration under Section 564(b)(1) of the A ct, 21 U.S.C. section 360bbb-3(b)(1), unless the authorization is terminated or revoked sooner.     Influenza A by PCR NEGATIVE NEGATIVE Final   Influenza B by PCR NEGATIVE NEGATIVE Final    Comment: (NOTE) The Xpert Xpress SARS-CoV-2/FLU/RSV plus assay is intended as an aid in the diagnosis of influenza from Nasopharyngeal swab specimens and should not be used as a sole basis for treatment. Nasal washings and aspirates are unacceptable for Xpert Xpress SARS-CoV-2/FLU/RSV testing.  Fact Sheet for Patients: EntrepreneurPulse.com.au  Fact Sheet for Healthcare  Providers: IncredibleEmployment.be  This test is not yet approved or cleared by the [place] FDA and has been authorized for detection and/or diagnosis of SARS-CoV-2 by FDA under an Emergency Use Authorization (EUA). This EUA will remain in effect (meaning this test can be used) for the duration of the COVID-19 declaration under Section 564(b)(1) of the Act, 21 U.S.C. section 360bbb-3(b)(1), unless the authorization is terminated or revoked.  Performed at [facility], [place] [address] [place], [place] [address]     Procedures/Studies: DG Pelvis 1-2 Views  Result Date: 05/27/2022 CLINICAL DATA:  Pelvic pain.  Recent fall. EXAM: PELVIS - 1-2 VIEW COMPARISON:  04/08/2014 FINDINGS: There is no evidence of acute fracture or dislocation. Bilateral total hip arthroplasties again noted. No acute bony abnormalities are present. IMPRESSION: No acute abnormality. Electronically Signed   By: Margarette Canada M.D.   On: 05/27/2022 17:29   DG Chest 2 View  Result Date: 05/27/2022 CLINICAL DATA:  Chest pain EXAM: CHEST - 2 VIEW COMPARISON:  02/03/2022 and prior studies FINDINGS: The cardiomediastinal silhouette is unremarkable. Mild peribronchial thickening again noted. There is no evidence of focal airspace disease, pulmonary edema, suspicious pulmonary nodule/mass, pleural effusion, or pneumothorax. No acute bony abnormalities are identified. IMPRESSION: No evidence of acute cardiopulmonary disease. Electronically Signed   By: Margarette Canada M.D.   On: 05/27/2022 17:28   CT Cervical Spine Wo Contrast  Result Date: 05/27/2022 CLINICAL DATA:  Trauma EXAM: CT CERVICAL SPINE WITHOUT CONTRAST TECHNIQUE: Multidetector CT imaging of the cervical spine was performed without intravenous contrast. Multiplanar CT image reconstructions were also generated. RADIATION DOSE REDUCTION: This exam was performed according to the departmental dose-optimization program which includes  automated exposure control, adjustment of the mA and/or kV according to patient size and/or use of iterative reconstruction technique. COMPARISON:  02/18/2020 FINDINGS: Alignment: Alignment of posterior margins of vertebral bodies is unremarkable. Skull base and vertebrae: No  recent fracture is seen. There is fusion of C3 and C4 vertebrae. Soft tissues and spinal canal: There is no central spinal stenosis. Disc levels: There is encroachment of neural foramina from C4-C7 levels. Upper chest: Unremarkable. Other: None. IMPRESSION: No recent fracture is seen Cervical spondylosis with encroachment of neural foramina from C4-C7 levels. Electronically Signed   By: Elmer Picker M.D.   On: 05/27/2022 17:18   CT Head Wo Contrast  Result Date: 05/27/2022 CLINICAL DATA:  Trauma EXAM: CT HEAD WITHOUT CONTRAST TECHNIQUE: Contiguous axial images were obtained from the base of the skull through the vertex without intravenous contrast. RADIATION DOSE REDUCTION: This exam was performed according to the departmental dose-optimization program which includes automated exposure control, adjustment of the mA and/or kV according to patient size and/or use of iterative reconstruction technique. COMPARISON:  04/16/2022 FINDINGS: Brain: No acute intracranial findings are seen. There is possible tiny lipoma in the falx. There are no signs of bleeding within the cranium. Cortical sulci are prominent. There is decreased density in periventricular white matter. Vascular: Unremarkable. Skull: No fracture is seen in calvarium. Hyperostosis frontalis interna is seen. Sinuses/Orbits: There is mild mucosal thickening in ethmoid and left maxillary sinuses. Other: None. IMPRESSION: No acute intracranial findings are seen in noncontrast CT brain. Atrophy. Small vessel disease. Mild chronic sinusitis. Electronically Signed   By: Elmer Picker M.D.   On: 05/27/2022 17:14    Labs: BNP (last 3 results) No results for input(s): "BNP" in  the last 8760 hours. Basic Metabolic Panel: Recent Labs  Lab 05/27/22 1720 05/28/22 1053 05/29/22 0511 05/30/22 0653  NA 141 141 142 141  K 2.8* 3.5 3.6 3.6  CL 109 115* 111 109  CO2 24 19* 24 24  GLUCOSE 105* 101* 100* 91  BUN 20 16 11 11   CREATININE 0.85 0.55 0.52 0.56  CALCIUM 9.4 8.6* 8.7* 9.1  MG  --  1.7 1.9  --   PHOS  --   --  3.2  --    Liver Function Tests: Recent Labs  Lab 05/29/22 0511 05/30/22 0653  AST 63* 39  ALT 84* 59*  ALKPHOS 52 50  BILITOT 0.5 0.6  PROT 6.5 6.3*  ALBUMIN 3.5 3.3*   No results for input(s): "LIPASE", "AMYLASE" in the last 168 hours. No results for input(s): "AMMONIA" in the last 168 hours. CBC: Recent Labs  Lab 05/27/22 1720 05/29/22 0511 05/30/22 0653  WBC 6.3 5.2 4.4  NEUTROABS 4.3 3.6 2.9  HGB 12.3 12.1 12.3  HCT 37.9 37.0 38.2  MCV 91.8 93.0 92.3  PLT 218 186 206   Cardiac Enzymes: No results for input(s): "CKTOTAL", "CKMB", "CKMBINDEX", "TROPONINI" in the last 168 hours. BNP: Invalid input(s): "POCBNP" CBG: Recent Labs  Lab 05/28/22 2101 05/29/22 0743 05/29/22 1137 05/29/22 1656 05/29/22 2333  GLUCAP 93 83 110* 84 82   D-Dimer Recent Labs    05/29/22 0511  DDIMER 0.87*   Hgb A1c No results for input(s): "HGBA1C" in the last 72 hours. Lipid Profile No results for input(s): "CHOL", "HDL", "LDLCALC", "TRIG", "CHOLHDL", "LDLDIRECT" in the last 72 hours. Thyroid function studies No results for input(s): "TSH", "T4TOTAL", "T3FREE", "THYROIDAB" in the last 72 hours.  Invalid input(s): "FREET3" Anemia work up Recent Labs    05/28/22 1711 05/29/22 0511  FERRITIN 67 66   Urinalysis    Component Value Date/Time   COLORURINE YELLOW 05/27/2022 2005   APPEARANCEUR CLEAR 05/27/2022 2005   LABSPEC 1.018 05/27/2022 2005   PHURINE 5.0 05/27/2022  2005   GLUCOSEU NEGATIVE 05/27/2022 2005   HGBUR MODERATE (A) 05/27/2022 2005   BILIRUBINUR NEGATIVE 05/27/2022 2005   BILIRUBINUR negative 03/03/2022 1637    BILIRUBINUR Negative 01/25/2022 1526   KETONESUR 5 (A) 05/27/2022 2005   PROTEINUR 100 (A) 05/27/2022 2005   UROBILINOGEN 0.2 03/03/2022 1637   NITRITE NEGATIVE 05/27/2022 2005   LEUKOCYTESUR NEGATIVE 05/27/2022 2005   Sepsis Labs Recent Labs  Lab 05/27/22 1720 05/29/22 0511 05/30/22 0653  WBC 6.3 5.2 4.4   Microbiology Recent Results (from the past 240 hour(s))  Resp Panel by RT-PCR (Flu A&B, Covid) Anterior Nasal Swab     Status: Abnormal   Collection Time: 05/27/22 11:14 PM   Specimen: Anterior Nasal Swab  Result Value Ref Range Status   SARS Coronavirus 2 by RT PCR POSITIVE (A) NEGATIVE Final    Comment: (NOTE) SARS-CoV-2 target nucleic acids are DETECTED.  The SARS-CoV-2 RNA is generally detectable in upper respiratory specimens during the acute phase of infection. Positive results are indicative of the presence of the identified virus, but do not rule out bacterial infection or co-infection with other pathogens not detected by the test. Clinical correlation with patient history and other diagnostic information is necessary to determine patient infection status. The expected result is Negative.  Fact Sheet for Patients: EntrepreneurPulse.com.au  Fact Sheet for Healthcare Providers: IncredibleEmployment.be  This test is not yet approved or cleared by the [place] FDA and  has been authorized for detection and/or diagnosis of SARS-CoV-2 by FDA under an Emergency Use Authorization (EUA).  This EUA will remain in effect (meaning this test can be used) for the duration of  the COVID-19 declaration under Section 564(b)(1) of the A ct, 21 U.S.C. section 360bbb-3(b)(1), unless the authorization is terminated or revoked sooner.     Influenza A by PCR NEGATIVE NEGATIVE Final   Influenza B by PCR NEGATIVE NEGATIVE Final    Comment: (NOTE) The Xpert Xpress SARS-CoV-2/FLU/RSV plus assay is intended as an aid in the diagnosis of  influenza from Nasopharyngeal swab specimens and should not be used as a sole basis for treatment. Nasal washings and aspirates are unacceptable for Xpert Xpress SARS-CoV-2/FLU/RSV testing.  Fact Sheet for Patients: EntrepreneurPulse.com.au  Fact Sheet for Healthcare Providers: IncredibleEmployment.be  This test is not yet approved or cleared by the [place] FDA and has been authorized for detection and/or diagnosis of SARS-CoV-2 by FDA under an Emergency Use Authorization (EUA). This EUA will remain in effect (meaning this test can be used) for the duration of the COVID-19 declaration under Section 564(b)(1) of the Act, 21 U.S.C. section 360bbb-3(b)(1), unless the authorization is terminated or revoked.  Performed at [facility], [place] [address], [place], [place] [address]    Time coordinating discharge: 25 minutes  SIGNED: Antonieta Pert, MD  [facility] 05/30/2022, 11:26 AM  If 7PM-7AM, please contact night-coverage [email or web address]

## 2022-05-30 NOTE — Progress Notes (Signed)
Pt to be discharged to [facility].  PT worked with pt and requesting walker to be ordered.  Waiting on delivery of walker and then sister, Joaquim Lai, stated she will take pt to [facility].  Will continue to monitor.

## 2022-05-30 NOTE — Discharge Instructions (Signed)

## 2022-05-30 NOTE — TOC Transition Note (Signed)
Transition of Care [facility]) - CM/SW Discharge Note   Patient Details  Name: Felicia Kelly MRN: [id number] Date of Birth: [birth year]  [facility] CM/SW Contact:  Ross Ludwig, LCSW Phone Number: 05/30/2022, 1:57 PM   Clinical Narrative:     CSW spoke to patient's sister Manus Gunning who confirmed patient is from [employer] [facility].  CSW spoke to [place] at [place] and she confirmed patient is currently being seen by them for home health PT, OT, and ST.  Physician ordered home health RN aide and social worker as well for patient, CSW spoke to [place] at [place] they can accept patient for services.  Per patient's sister, they have a caregiver who sees patient and provides medication twice a day for patient.  Patient's PCP is now Dr. Marcell Anger at [place] at [place].  CSW updated [place] at [place], and [place] at [place].  Patient needed a rolling walker, CSW ordered walker from [facility] which will be delivered to patient's room before she is ready for discharge.  CSW signing off, patient's sister will be transporting her home.   Final next level of care: Other (comment) ([facility]) Barriers to Discharge: Barriers Resolved   Patient Goals and CMS Choice Patient states their goals for this hospitalization and ongoing recovery are:: To return back home with home health services. CMS Medicare.gov Compare Post Acute Care list provided to:: Patient Represenative (must comment) Choice offered to / list presented to : Sibling  Discharge Placement                  Name of family member notified: Sister Manus Gunning Patient and family notified of of transfer: 05/30/22  Discharge Plan and Services In-house Referral: NA Discharge Planning Services: NA Post Acute Care Choice: Home Health          DME Arranged: Gilford Rile rolling DME Agency: [employer] Date DME Agency Contacted: 05/30/22 Time DME Agency Contacted: 4 Representative spoke with at DME  Agency: [facility] HH Arranged: PT, OT, RN, Nurse's Aide, Speech Therapy, Social Work [employer] Agency: [facility], Other - See comment Date [place]: 05/30/22 Time [place]: 1234 Representative spoke with at [place],  Social Determinants of Health ([place]) Interventions     Readmission Risk Interventions    05/29/2022   11:58 AM  Readmission Risk Prevention Plan  Transportation Screening Complete  PCP or Specialist Appt within 5-7 Days Complete  Home Care Screening Complete  Medication Review (RN CM) Complete

## 2022-05-31 ENCOUNTER — Telehealth: Payer: Self-pay

## 2022-05-31 DIAGNOSIS — M6281 Muscle weakness (generalized): Secondary | ICD-10-CM | POA: Diagnosis not present

## 2022-05-31 DIAGNOSIS — R2689 Other abnormalities of gait and mobility: Secondary | ICD-10-CM | POA: Diagnosis not present

## 2022-05-31 DIAGNOSIS — R488 Other symbolic dysfunctions: Secondary | ICD-10-CM | POA: Diagnosis not present

## 2022-05-31 DIAGNOSIS — R2681 Unsteadiness on feet: Secondary | ICD-10-CM | POA: Diagnosis not present

## 2022-05-31 DIAGNOSIS — R41841 Cognitive communication deficit: Secondary | ICD-10-CM | POA: Diagnosis not present

## 2022-05-31 NOTE — Telephone Encounter (Signed)
Transition Care Management Follow-up Telephone Call Date of discharge and from where: [place] 05-30-22 Dx: COVID/hypokalemia How have you been since you were released from the hospital? Dementia is getting worse  Any questions or concerns? No  Items Reviewed: Did the pt receive and understand the discharge instructions provided? Yes  Medications obtained and verified? Yes  Other? No  Any new allergies since your discharge? No  Dietary orders reviewed? Yes Do you have support at home? Yes   Home Care and Equipment/Supplies: Were home health services ordered? Yes- PT/OT/RN/SLP If so, what is the name of the agency? [place] set up a time to come to the patient's home? yes Were any new equipment or medical supplies ordered?  Yes: RW What is the name of the medical supply agency? hospital Were you able to get the supplies/equipment? yes Do you have any questions related to the use of the equipment or supplies? No  Functional Questionnaire: (I = Independent and D = Dependent) ADLs: D  Bathing/Dressing- D  Meal Prep- D  Eating- I  Maintaining continence- I  Transferring/Ambulation- I-WALKER  Managing Meds- D  Follow up appointments reviewed:  PCP Hospital f/u appt confirmed? Yes  Scheduled to see Dr Legrand Como on 06-08-22 @ 230pm. [place] [facility] f/u appt confirmed? No  . Are transportation arrangements needed? No  If their condition worsens, is the pt aware to call PCP or go to the Emergency Dept.? Yes Was the patient provided with contact information for the PCP's office or ED? Yes Was to pt encouraged to call back with questions or concerns? Yes

## 2022-06-02 ENCOUNTER — Emergency Department (HOSPITAL_COMMUNITY)
Admission: EM | Admit: 2022-06-02 | Discharge: 2022-06-02 | Disposition: A | Discharge status: Home / Self Care | Payer: Medicare Other | Attending: Emergency Medicine | Admitting: Emergency Medicine

## 2022-06-02 ENCOUNTER — Telehealth: Payer: Self-pay

## 2022-06-02 ENCOUNTER — Emergency Department (HOSPITAL_COMMUNITY): Payer: Medicare Other

## 2022-06-02 ENCOUNTER — Encounter (HOSPITAL_COMMUNITY): Payer: Self-pay

## 2022-06-02 DIAGNOSIS — R110 Nausea: Secondary | ICD-10-CM | POA: Diagnosis not present

## 2022-06-02 DIAGNOSIS — R748 Abnormal levels of other serum enzymes: Secondary | ICD-10-CM | POA: Insufficient documentation

## 2022-06-02 DIAGNOSIS — R410 Disorientation, unspecified: Secondary | ICD-10-CM | POA: Diagnosis not present

## 2022-06-02 DIAGNOSIS — I10 Essential (primary) hypertension: Secondary | ICD-10-CM | POA: Insufficient documentation

## 2022-06-02 DIAGNOSIS — Z7984 Long term (current) use of oral hypoglycemic drugs: Secondary | ICD-10-CM | POA: Insufficient documentation

## 2022-06-02 DIAGNOSIS — R630 Anorexia: Secondary | ICD-10-CM | POA: Diagnosis not present

## 2022-06-02 DIAGNOSIS — F0390 Unspecified dementia without behavioral disturbance: Secondary | ICD-10-CM | POA: Insufficient documentation

## 2022-06-02 DIAGNOSIS — Z7982 Long term (current) use of aspirin: Secondary | ICD-10-CM | POA: Diagnosis not present

## 2022-06-02 DIAGNOSIS — Z8616 Personal history of COVID-19: Secondary | ICD-10-CM | POA: Insufficient documentation

## 2022-06-02 DIAGNOSIS — R531 Weakness: Secondary | ICD-10-CM | POA: Insufficient documentation

## 2022-06-02 LAB — URINALYSIS, ROUTINE W REFLEX MICROSCOPIC
Bilirubin Urine: NEGATIVE
Glucose, UA: NEGATIVE mg/dL
Hgb urine dipstick: NEGATIVE
Ketones, ur: 20 mg/dL — AB
Leukocytes,Ua: NEGATIVE
Nitrite: NEGATIVE
Protein, ur: 30 mg/dL — AB
Specific Gravity, Urine: 1.019 (ref 1.005–1.030)
pH: 6 (ref 5.0–8.0)

## 2022-06-02 LAB — COMPREHENSIVE METABOLIC PANEL
ALT: 44 U/L (ref 0–44)
AST: 35 U/L (ref 15–41)
Albumin: 4 g/dL (ref 3.5–5.0)
Alkaline Phosphatase: 54 U/L (ref 38–126)
Anion gap: 7 (ref 5–15)
BUN: 20 mg/dL (ref 8–23)
CO2: 26 mmol/L (ref 22–32)
Calcium: 9.7 mg/dL (ref 8.9–10.3)
Chloride: 107 mmol/L (ref 98–111)
Creatinine, Ser: 0.67 mg/dL (ref 0.44–1.00)
GFR, Estimated: >60 mL/min (ref >60)
Glucose, Bld: 106 mg/dL — ABNORMAL HIGH (ref 70–99)
Potassium: 3.8 mmol/L (ref 3.5–5.1)
Sodium: 140 mmol/L (ref 135–145)
Total Bilirubin: 0.5 mg/dL (ref 0.3–1.2)
Total Protein: 7.5 g/dL (ref 6.5–8.1)

## 2022-06-02 LAB — CBC WITH DIFFERENTIAL/PLATELET
Abs Immature Granulocytes: 0.01 10*3/uL (ref 0.00–0.07)
Basophils Absolute: 0 10*3/uL (ref 0.0–0.1)
Basophils Relative: 0 %
Eosinophils Absolute: 0 10*3/uL (ref 0.0–0.5)
Eosinophils Relative: 0 %
HCT: 39.4 % (ref 36.0–46.0)
Hemoglobin: 13.1 g/dL (ref 12.0–15.0)
Immature Granulocytes: 0 %
Lymphocytes Relative: 24 %
Lymphs Abs: 1.8 10*3/uL (ref 0.7–4.0)
MCH: 30 pg (ref 26.0–34.0)
MCHC: 33.2 g/dL (ref 30.0–36.0)
MCV: 90.2 fL (ref 80.0–100.0)
Monocytes Absolute: 0.4 10*3/uL (ref 0.1–1.0)
Monocytes Relative: 6 %
Neutro Abs: 5.3 10*3/uL (ref 1.7–7.7)
Neutrophils Relative %: 70 %
Platelets: 275 10*3/uL (ref 150–400)
RBC: 4.37 MIL/uL (ref 3.87–5.11)
RDW: 13.1 % (ref 11.5–15.5)
WBC: 7.5 10*3/uL (ref 4.0–10.5)
nRBC: 0 % (ref 0.0–0.2)

## 2022-06-02 LAB — LIPASE, BLOOD: Lipase: 109 U/L — ABNORMAL HIGH (ref 11–51)

## 2022-06-02 LAB — TROPONIN I (HIGH SENSITIVITY): Troponin I (High Sensitivity): 4 ng/L (ref <18)

## 2022-06-02 LAB — MAGNESIUM: Magnesium: 2.1 mg/dL (ref 1.7–2.4)

## 2022-06-02 MED ORDER — ONDANSETRON 4 MG PO TBDP: 4.0000 mg | ORAL_TABLET | Freq: Three times a day (TID) | ORAL | 0 refills | Status: DC | PRN

## 2022-06-02 NOTE — ED Triage Notes (Signed)
Pt arrived via EMS, generalized wkn, not taking POs well. Hx of dementia. Dx with COVID 05/27/22

## 2022-06-02 NOTE — Telephone Encounter (Signed)
Pt sister francis  is calling to let dr Legrand Como know paramedic  is taking her sister to [facility] due to [place].

## 2022-06-02 NOTE — Telephone Encounter (Signed)
Spoke with Mrs Loma Sousa and informed her of the message below.

## 2022-06-02 NOTE — ED Notes (Signed)
IV will flush but not pull back for blood

## 2022-06-02 NOTE — ED Provider Notes (Signed)
[facility] DEPT Provider Note   CSN: [id number] Arrival date & time: 06/02/22  1219     History  No chief complaint on file.   Felicia Kelly is a 86 y.o. female history includes atherosclerosis, dementia, hypertension, hyperlipidemia.  Patient presented with her sister/POA Erling Cruz.  79 of history obtained from patient's sister due to patient's history of dementia.  Patient brought in today by her sister for concern of decreased p.o. intake and generalized weakness over the past few days.  They report patient was admitted to the hospital after a fall last week, while at the hospital she was diagnosed with COVID.  Patient had been discharged to home but since that time she has not been able to eat as much is normal she reports occasional nausea and generalized weakness, patient's sister had difficulty getting her off the toilet today which prompted their ER visit.  They deny any new fall or injury.  They deny fever, chills, chest pain/shortness of breath, vomiting, diarrhea, dysuria, numbness, tingling, facial droop or any additional concerns.  HPI     Home Medications Prior to Admission medications   Medication Sig Start Date End Date Taking? Authorizing Provider  ascorbic acid (VITAMIN C) 500 MG tablet Take 1 tablet (500 mg total) by mouth daily for 14 days. 05/31/22 06/14/22  Antonieta Pert, MD  aspirin EC 81 MG tablet Take 81 mg by mouth every evening. Swallow whole.    [provider]  Cholecalciferol (VITAMIN D-3) 25 MCG (1000 UT) CAPS Take 1,000 Units by mouth every Monday, Wednesday, and Friday.    [provider]  cyanocobalamin 100 MCG tablet Take 100 mcg by mouth daily.    [provider]  ezetimibe (ZETIA) 10 MG tablet Take 1 tablet (10 mg total) by mouth daily. 02/14/22   [name], MD  levothyroxine (SYNTHROID) 50 MCG tablet Take 50-100 mcg by mouth See admin instructions. Take 100 mcg by mouth in  the morning before breakfast on Sundays and 50 mcg on Mon/Tues/Wed/Thurs/Fri/Sat    [provider]  Magnesium 250 MG TABS Take 250 mg by mouth daily.    [provider]  melatonin 1 MG TABS tablet Take 1 mg by mouth at bedtime.    [provider]  METAMUCIL FIBER PO Take 1 capsule by mouth every evening.    [provider]  metFORMIN (GLUCOPHAGE-XR) 500 MG 24 hr tablet Take 1 tablet (500 mg total) by mouth daily with breakfast. 02/16/22   Caren Macadam, MD  Multiple Vitamin (MULTIVITAMIN) tablet Take 1 tablet by mouth daily with breakfast.    [provider]  Multiple Vitamins-Minerals ([place]) CAPS Take 1 capsule by mouth in the morning.    [provider]  nirmatrelvir/ritonavir EUA (PAXLOVID) 20 x 150 MG & 10 x 100MG TABS Take 3 tablets by mouth 2 (two) times daily for 3 days. Patient GFR is 44. Take nirmatrelvir (150 mg) two tablets twice daily for 5 days and ritonavir (100 mg) one tablet twice daily for 5 days. Finish the remaining tablets in the kit 05/30/22 06/02/22  Antonieta Pert, MD  potassium chloride (KLOR-CON M) 10 MEQ tablet Take 1 tablet (10 mEq total) by mouth daily. 02/27/22   [name], MD  pravastatin (PRAVACHOL) 40 MG tablet TAKE 1 TABLET ONCE DAILY. Patient taking differently: Take 40 mg by mouth every evening. 02/16/22   [name], MD  Probiotic Product (PROBIOTIC PO) Take 1 capsule  by mouth every evening.    [provider]  rivastigmine (EXELON) 4.5 MG capsule Take 1 capsule (4.5 mg total) by mouth 2 (two) times daily. 02/27/22   Rondel Jumbo, PA-C  valACYclovir (VALTREX) 500 MG tablet TAKE ONE TABLET BY MOUTH ONCE DAILY Patient taking differently: Take 500 mg by mouth daily. 02/16/22   Caren Macadam, MD  zinc sulfate 220 (50 Zn) MG capsule Take 1 capsule (220 mg total) by mouth daily for 14 days. 05/31/22 06/14/22  Antonieta Pert, MD      Allergies    Tetracycline,  Atorvastatin, Bactrim [sulfamethoxazole-trimethoprim], Celecoxib, Niacin, Rosuvastatin, Simvastatin, Tetracyclines & related, and Penicillins    Review of Systems   Review of Systems Ten systems are reviewed and are negative for acute change except as noted in the HPI  Physical Exam Updated Vital Signs BP (!) 167/73 (BP Location: Left Arm)   Pulse 62   Temp (!) 97.3 F (36.3 C) (Oral)   SpO2 97%  Physical Exam Constitutional:      General: She is not in acute distress.    Appearance: Normal appearance. She is well-developed. She is not ill-appearing or diaphoretic.  HENT:     Head: Normocephalic and atraumatic.  Eyes:     General: Vision grossly intact. Gaze aligned appropriately.     Pupils: Pupils are equal, round, and reactive to light.  Neck:     Trachea: Trachea and phonation normal.  Cardiovascular:     Rate and Rhythm: Normal rate and regular rhythm.  Pulmonary:     Effort: Pulmonary effort is normal. No respiratory distress.     Breath sounds: Normal breath sounds.  Abdominal:     General: There is no distension.     Palpations: Abdomen is soft.     Tenderness: There is no abdominal tenderness. There is no guarding or rebound.  Musculoskeletal:        General: Normal range of motion.     Cervical back: Normal range of motion.     Right lower leg: No edema.     Left lower leg: No edema.  Skin:    General: Skin is warm and dry.  Neurological:     Mental Status: She is alert.     GCS: GCS eye subscore is 4. GCS verbal subscore is 5. GCS motor subscore is 6.     Comments: Speech is clear and goal oriented, follows commands Major Cranial nerves without deficit, no facial droop Moves extremities without ataxia, coordination intact Equal grip strength bilaterally  Psychiatric:        Behavior: Behavior normal.     ED Results / Procedures / Treatments   Labs (all labs ordered are listed, but only abnormal results are displayed) Labs Reviewed  CBC WITH  DIFFERENTIAL/PLATELET  COMPREHENSIVE METABOLIC PANEL  MAGNESIUM  URINALYSIS, ROUTINE W REFLEX MICROSCOPIC  LIPASE, BLOOD  TROPONIN I (HIGH SENSITIVITY)    EKG None  Radiology DG Chest Port 1 View  Result Date: 06/02/2022 CLINICAL DATA:  Weakness. EXAM: PORTABLE CHEST 1 VIEW COMPARISON:  May 27, 2022. FINDINGS: The heart size and mediastinal contours are within normal limits. Both lungs are clear. The visualized skeletal structures are unremarkable. IMPRESSION: No active disease. Electronically Signed   By: Marijo Conception M.D.   On: 06/02/2022 13:34    Procedures Procedures    Medications Ordered in ED Medications - No data to display  ED Course/ Medical Decision Making/ A&P Clinical Course as of 06/02/22 1511  Fri Jun 02, 2022  1509 Magnesium Magnesium within normal limits. [BM]  1509 Lipase, blood(!) Lipase elevated at 109. [BM]  1509 Troponin I (High Sensitivity) High sensitive troponin is negative.  Patient has no chest pain or shortness of breath, there is no indication for delta troponin. [BM]  1510 CBC with Differential CBC with hemoglobin of 13.1, no evidence for symptomatic anemia.  No leukocytosis or thrombocytopenia. [BM]  1510 Comprehensive metabolic panel(!) CMP shows no emergent electrolyte derangement, AKI, LFT elevations or gap. [BM]  1510 DG Chest Port 1 View I personally reviewed patient's 1 view chest x-ray I do not appreciate any obvious PTX or consolidations. [BM]    Clinical Course User Index [BM] Gari Crown                           Medical Decision Making 86 year old female with history as above presented for generalized weakness/fatigue and decreased p.o. intake over the past few days.  She finished Paxlovid for COVID-19 infection yesterday.  Overall she is in no acute distress, vital signs are stable on room air.  She denies any chest pain, shortness of breath, vomiting diarrhea or abdominal pain.  She is equal strength  bilaterally.  Cardiopulmonary exam is unremarkable.  Abdomen soft nontender.  Plan of care is to check basic labs, chest x-ray, EKG and monitor patient.  No indication for CT imaging at this time.  Suspect patient decreased p.o. intake and fatigue secondary to recent viral infection.  Amount and/or Complexity of Data Reviewed Labs: ordered. Decision-making details documented in ED Course. Radiology:  Decision-making details documented in ED Course. ECG/medicine tests: ordered.    Care handoff given to [place] PA-C at shift change.  Plan of care is to follow-up on UA, EKG and monitor patient.  P.o. challenge and ambulation.  Pending no emergent findings anticipate discharge in the care of patient's sister.  Final disposition per oncoming team. Patient seen and evaluated by Dr. Ashok Cordia during this visit who agrees with plan.  Note: Portions of this report may have been transcribed using voice recognition software. Every effort was made to ensure accuracy; however, inadvertent computerized transcription errors may still be present.         Final Clinical Impression(s) / ED Diagnoses Final diagnoses:  None    Rx / DC Orders ED Discharge Orders     None         Gari Crown 06/02/22 1512    Lajean Saver, MD 06/02/22 [phone]

## 2022-06-02 NOTE — Telephone Encounter (Signed)
Ok, hopefully she will still be able to see me next week!

## 2022-06-02 NOTE — Discharge Instructions (Addendum)
It is very important that you follow-up with your primary care provider and discuss your lack of appetite/fatigue.  Make sure that you are drinking plenty of water, make sure you eating food I recommend starting at least with broth/soup or at the very least taking a boost or Ensure 3 times a day.  Please return to the emergency room for any new or concerning symptoms otherwise please talk to primary care provider and the staff at your assisted living facility.  I have also provided you with a prescription for Zofran which is a nausea medicine to take as needed for any nausea if this is getting in the way of you eating.  Should you begin to experience any difficulty breathing vomiting, nausea, confusion please return.

## 2022-06-05 DIAGNOSIS — R2681 Unsteadiness on feet: Secondary | ICD-10-CM | POA: Diagnosis not present

## 2022-06-05 DIAGNOSIS — R41841 Cognitive communication deficit: Secondary | ICD-10-CM | POA: Diagnosis not present

## 2022-06-05 DIAGNOSIS — M6281 Muscle weakness (generalized): Secondary | ICD-10-CM | POA: Diagnosis not present

## 2022-06-05 DIAGNOSIS — R2689 Other abnormalities of gait and mobility: Secondary | ICD-10-CM | POA: Diagnosis not present

## 2022-06-05 DIAGNOSIS — R488 Other symbolic dysfunctions: Secondary | ICD-10-CM | POA: Diagnosis not present

## 2022-06-06 DIAGNOSIS — R2681 Unsteadiness on feet: Secondary | ICD-10-CM | POA: Diagnosis not present

## 2022-06-06 DIAGNOSIS — R41841 Cognitive communication deficit: Secondary | ICD-10-CM | POA: Diagnosis not present

## 2022-06-06 DIAGNOSIS — M6281 Muscle weakness (generalized): Secondary | ICD-10-CM | POA: Diagnosis not present

## 2022-06-06 DIAGNOSIS — R488 Other symbolic dysfunctions: Secondary | ICD-10-CM | POA: Diagnosis not present

## 2022-06-06 DIAGNOSIS — R2689 Other abnormalities of gait and mobility: Secondary | ICD-10-CM | POA: Diagnosis not present

## 2022-06-07 DIAGNOSIS — R488 Other symbolic dysfunctions: Secondary | ICD-10-CM | POA: Diagnosis not present

## 2022-06-07 DIAGNOSIS — R2689 Other abnormalities of gait and mobility: Secondary | ICD-10-CM | POA: Diagnosis not present

## 2022-06-07 DIAGNOSIS — R41841 Cognitive communication deficit: Secondary | ICD-10-CM | POA: Diagnosis not present

## 2022-06-07 DIAGNOSIS — M6281 Muscle weakness (generalized): Secondary | ICD-10-CM | POA: Diagnosis not present

## 2022-06-07 DIAGNOSIS — R2681 Unsteadiness on feet: Secondary | ICD-10-CM | POA: Diagnosis not present

## 2022-06-08 ENCOUNTER — Encounter: Payer: Self-pay | Admitting: Family Medicine

## 2022-06-08 ENCOUNTER — Telehealth: Payer: Self-pay | Admitting: *Deleted

## 2022-06-08 ENCOUNTER — Ambulatory Visit (INDEPENDENT_AMBULATORY_CARE_PROVIDER_SITE_OTHER): Payer: Medicare Other | Admitting: Family Medicine

## 2022-06-08 VITALS — BP 140/90 | HR 75 | Temp 97.4°F | Ht 62.0 in | Wt 128.2 lb

## 2022-06-08 DIAGNOSIS — R634 Abnormal weight loss: Secondary | ICD-10-CM

## 2022-06-08 DIAGNOSIS — I10 Essential (primary) hypertension: Secondary | ICD-10-CM | POA: Diagnosis not present

## 2022-06-08 MED ORDER — MIRTAZAPINE 15 MG PO TBDP: 15.0000 mg | ORAL_TABLET | Freq: Every day | ORAL | 1 refills | Status: DC

## 2022-06-08 NOTE — Telephone Encounter (Signed)
        Patient  visited [facility] on 06/01/2022  for weaknes   Telephone encounter attempt :  1st  A HIPAA compliant voice message was left requesting a return call.  Instructed patient to call back at [phone]. [place] [phone] [address] , [place] [address] Email : [email or web address]

## 2022-06-08 NOTE — Assessment & Plan Note (Signed)
Patient has experienced a gradual weight loss over the last 1-2 years, appetite made worse by her COVID infection more recently. I discussed medication options with her sister, it was decided to try mirtazepine at night to help her with her appetite. We discussed the side effects of the medication and that it would take a few weeks to notice a difference. I will bring her back in 3 months to check her weight and re-evaluate her symptoms.

## 2022-06-08 NOTE — Progress Notes (Signed)
Established Patient Office Visit  Subjective   Patient ID: Felicia Kelly, female    DOB: [birth year]  Age: 86 y.o. MRN: [id number]  Chief Complaint  Patient presents with   Establish Care   Follow-up    Patient presents for ER follow up post Covid-19 infection   Anorexia    Patient's sister requests a medication be given to help with the lack of appetite for the past 3 weeks    Patient is here for follow up today, Sister reports that she was admitted for [place] about 2 weeks ago. States that she did finish the paxlovid and she is feeling some better since then, However her sister is concerned about there appetite, states that she has been steadily losing weight over the past couple of years, states that she was dehydrated at one her ED visits. I reviewed the last few years of weight measurements, She has lost approximately 40 pounds in the last year.     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   2019 novel coronavirus disease (COVID-19) 05/28/2022   Hypocalcemia 05/28/2022   Hypoglycemia 05/28/2022   SAH (subarachnoid hemorrhage) ([place]) 02/24/2020   Bradycardia 04/01/2019   Cystocele 02/13/2019   Hip pain 02/13/2019   Osteoarthritis of hip 02/13/2019   Subchondral cysts 02/13/2019   Vitamin D deficiency 02/13/2019   Aortic atherosclerosis ([place]) 10/14/2017   Solitary pulmonary nodule 10/23/2016   Hyperglycemia 07/11/2016   Hypercholesterolemia 06/04/2013   Hypothyroidism 06/04/2013   Osteopenia 06/27/2012   Malignant basal cell neoplasm of skin 02/14/2012   Benign essential hypertension 11/22/2011   Internal carotid artery stenosis 09/18/2011   Multinodular goiter 10/16/2009   Herpes simplex type 2 infection 10/05/2008   Single renal cyst 02/14/2008      Review of Systems  Constitutional:  Positive for weight loss. Negative for chills, fever and malaise/fatigue.  All other  systems reviewed and are negative.     Objective:     BP (!) 140/90 (BP Location: Left Arm, Patient Position: Sitting, Cuff Size: Normal)   Pulse 75   Temp (!) 97.4 F (36.3 C) (Oral)   Ht '5\' 2"'$  (1.575 m)   Wt 128 lb 3.2 oz (58.2 kg)   SpO2 98%   BMI 23.45 kg/m  BP Readings from Last 3 Encounters:  06/08/22 (!) 140/90  06/02/22 133/69  05/30/22 (!) 150/72   Wt Readings from Last 3 Encounters:  06/08/22 128 lb 3.2 oz (58.2 kg)  05/28/22 133 lb 2.5 oz (60.4 kg)  05/16/22 133 lb 4 oz (60.4 kg)      Physical Exam Vitals reviewed.  Constitutional:      Appearance: Normal appearance. She is well-groomed and normal weight.  HENT:     Head: Normocephalic and atraumatic.  Eyes:     Extraocular Movements: Extraocular movements intact.     Conjunctiva/sclera: Conjunctivae normal.  Cardiovascular:     Heart sounds: S1 normal and S2 normal.  Musculoskeletal:     Right lower leg: No edema.     Left lower leg: No edema.  Skin:    General: Skin is warm and dry.  Neurological:     Mental Status: She is alert. Mental status is at baseline. She is disoriented.     Gait: Gait is intact.     Comments: Patient is oriented to person only.   Psychiatric:        Mood and Affect: Mood  and affect normal.        Speech: Speech normal.      No results found for any visits on 06/08/22.  Last CBC Lab Results  Component Value Date   WBC 7.5 06/02/2022   HGB 13.1 06/02/2022   HCT 39.4 06/02/2022   MCV 90.2 06/02/2022   MCH 30.0 06/02/2022   RDW 13.1 06/02/2022   PLT 275 75/64/3329   Last metabolic panel Lab Results  Component Value Date   GLUCOSE 106 (H) 06/02/2022   NA 140 06/02/2022   K 3.8 06/02/2022   CL 107 06/02/2022   CO2 26 06/02/2022   BUN 20 06/02/2022   CREATININE 0.67 06/02/2022   GFRNONAA >60 06/02/2022   CALCIUM 9.7 06/02/2022   PHOS 3.2 05/29/2022   PROT 7.5 06/02/2022   ALBUMIN 4.0 06/02/2022   LABGLOB 2.4 02/03/2022   AGRATIO 1.7 02/03/2022    BILITOT 0.5 06/02/2022   ALKPHOS 54 06/02/2022   AST 35 06/02/2022   ALT 44 06/02/2022   ANIONGAP 7 06/02/2022      The ASCVD Risk score (Arnett DK, et al., 2019) failed to calculate for the following reasons:   The 2019 ASCVD risk score is only valid for ages 60 to 12    Assessment & Plan:   Problem List Items Addressed This Visit       Cardiovascular and Mediastinum   Benign essential hypertension - Primary    BP today is borderline, she is not currently on medication due to episodes of hypotension in the past and her increased risk of falls.        Other   Weight loss, unintentional (Chronic)    Patient has experienced a gradual weight loss over the last 1-2 years, appetite made worse by her COVID infection more recently. I discussed medication options with her sister, it was decided to try mirtazepine at night to help her with her appetite. We discussed the side effects of the medication and that it would take a few weeks to notice a difference. I will bring her back in 3 months to check her weight and re-evaluate her symptoms.       Relevant Medications   mirtazapine (REMERON SOL-TAB) 15 MG disintegrating tablet    Return in about 3 months (around 09/08/2022) for follow up visit.Farrel Conners, MD

## 2022-06-08 NOTE — Patient Instructions (Addendum)
Start with 1/2 tablet at bedtime for 7 days, then if no adverse reactions are seen then she may increase to 1 tablet at bedtime.

## 2022-06-08 NOTE — Assessment & Plan Note (Signed)
BP today is borderline, she is not currently on medication due to episodes of hypotension in the past and her increased risk of falls.

## 2022-06-09 DIAGNOSIS — R2681 Unsteadiness on feet: Secondary | ICD-10-CM | POA: Diagnosis not present

## 2022-06-09 DIAGNOSIS — M6281 Muscle weakness (generalized): Secondary | ICD-10-CM | POA: Diagnosis not present

## 2022-06-09 DIAGNOSIS — R2689 Other abnormalities of gait and mobility: Secondary | ICD-10-CM | POA: Diagnosis not present

## 2022-06-09 DIAGNOSIS — R488 Other symbolic dysfunctions: Secondary | ICD-10-CM | POA: Diagnosis not present

## 2022-06-09 DIAGNOSIS — R41841 Cognitive communication deficit: Secondary | ICD-10-CM | POA: Diagnosis not present

## 2022-06-12 DIAGNOSIS — R2689 Other abnormalities of gait and mobility: Secondary | ICD-10-CM | POA: Diagnosis not present

## 2022-06-12 DIAGNOSIS — R488 Other symbolic dysfunctions: Secondary | ICD-10-CM | POA: Diagnosis not present

## 2022-06-12 DIAGNOSIS — R41841 Cognitive communication deficit: Secondary | ICD-10-CM | POA: Diagnosis not present

## 2022-06-12 DIAGNOSIS — M6281 Muscle weakness (generalized): Secondary | ICD-10-CM | POA: Diagnosis not present

## 2022-06-12 DIAGNOSIS — R2681 Unsteadiness on feet: Secondary | ICD-10-CM | POA: Diagnosis not present

## 2022-06-13 DIAGNOSIS — R2681 Unsteadiness on feet: Secondary | ICD-10-CM | POA: Diagnosis not present

## 2022-06-13 DIAGNOSIS — R41841 Cognitive communication deficit: Secondary | ICD-10-CM | POA: Diagnosis not present

## 2022-06-13 DIAGNOSIS — M6281 Muscle weakness (generalized): Secondary | ICD-10-CM | POA: Diagnosis not present

## 2022-06-13 DIAGNOSIS — R488 Other symbolic dysfunctions: Secondary | ICD-10-CM | POA: Diagnosis not present

## 2022-06-13 DIAGNOSIS — R2689 Other abnormalities of gait and mobility: Secondary | ICD-10-CM | POA: Diagnosis not present

## 2022-06-14 DIAGNOSIS — R2681 Unsteadiness on feet: Secondary | ICD-10-CM | POA: Diagnosis not present

## 2022-06-14 DIAGNOSIS — R41841 Cognitive communication deficit: Secondary | ICD-10-CM | POA: Diagnosis not present

## 2022-06-14 DIAGNOSIS — R2689 Other abnormalities of gait and mobility: Secondary | ICD-10-CM | POA: Diagnosis not present

## 2022-06-14 DIAGNOSIS — R488 Other symbolic dysfunctions: Secondary | ICD-10-CM | POA: Diagnosis not present

## 2022-06-14 DIAGNOSIS — M6281 Muscle weakness (generalized): Secondary | ICD-10-CM | POA: Diagnosis not present

## 2022-06-14 DIAGNOSIS — H6123 Impacted cerumen, bilateral: Secondary | ICD-10-CM | POA: Diagnosis not present

## 2022-06-15 DIAGNOSIS — R41841 Cognitive communication deficit: Secondary | ICD-10-CM | POA: Diagnosis not present

## 2022-06-15 DIAGNOSIS — M6281 Muscle weakness (generalized): Secondary | ICD-10-CM | POA: Diagnosis not present

## 2022-06-15 DIAGNOSIS — R2689 Other abnormalities of gait and mobility: Secondary | ICD-10-CM | POA: Diagnosis not present

## 2022-06-15 DIAGNOSIS — R488 Other symbolic dysfunctions: Secondary | ICD-10-CM | POA: Diagnosis not present

## 2022-06-15 DIAGNOSIS — R2681 Unsteadiness on feet: Secondary | ICD-10-CM | POA: Diagnosis not present

## 2022-06-16 DIAGNOSIS — R41841 Cognitive communication deficit: Secondary | ICD-10-CM | POA: Diagnosis not present

## 2022-06-16 DIAGNOSIS — R488 Other symbolic dysfunctions: Secondary | ICD-10-CM | POA: Diagnosis not present

## 2022-06-16 DIAGNOSIS — R2681 Unsteadiness on feet: Secondary | ICD-10-CM | POA: Diagnosis not present

## 2022-06-16 DIAGNOSIS — R2689 Other abnormalities of gait and mobility: Secondary | ICD-10-CM | POA: Diagnosis not present

## 2022-06-16 DIAGNOSIS — M6281 Muscle weakness (generalized): Secondary | ICD-10-CM | POA: Diagnosis not present

## 2022-06-19 DIAGNOSIS — R488 Other symbolic dysfunctions: Secondary | ICD-10-CM | POA: Diagnosis not present

## 2022-06-19 DIAGNOSIS — R41841 Cognitive communication deficit: Secondary | ICD-10-CM | POA: Diagnosis not present

## 2022-06-19 DIAGNOSIS — M6281 Muscle weakness (generalized): Secondary | ICD-10-CM | POA: Diagnosis not present

## 2022-06-19 DIAGNOSIS — R2689 Other abnormalities of gait and mobility: Secondary | ICD-10-CM | POA: Diagnosis not present

## 2022-06-19 DIAGNOSIS — R2681 Unsteadiness on feet: Secondary | ICD-10-CM | POA: Diagnosis not present

## 2022-06-20 DIAGNOSIS — M6281 Muscle weakness (generalized): Secondary | ICD-10-CM | POA: Diagnosis not present

## 2022-06-20 DIAGNOSIS — R2689 Other abnormalities of gait and mobility: Secondary | ICD-10-CM | POA: Diagnosis not present

## 2022-06-20 DIAGNOSIS — R488 Other symbolic dysfunctions: Secondary | ICD-10-CM | POA: Diagnosis not present

## 2022-06-20 DIAGNOSIS — R2681 Unsteadiness on feet: Secondary | ICD-10-CM | POA: Diagnosis not present

## 2022-06-20 DIAGNOSIS — R41841 Cognitive communication deficit: Secondary | ICD-10-CM | POA: Diagnosis not present

## 2022-06-21 DIAGNOSIS — M6281 Muscle weakness (generalized): Secondary | ICD-10-CM | POA: Diagnosis not present

## 2022-06-21 DIAGNOSIS — R2681 Unsteadiness on feet: Secondary | ICD-10-CM | POA: Diagnosis not present

## 2022-06-21 DIAGNOSIS — R41841 Cognitive communication deficit: Secondary | ICD-10-CM | POA: Diagnosis not present

## 2022-06-21 DIAGNOSIS — R488 Other symbolic dysfunctions: Secondary | ICD-10-CM | POA: Diagnosis not present

## 2022-06-21 DIAGNOSIS — R2689 Other abnormalities of gait and mobility: Secondary | ICD-10-CM | POA: Diagnosis not present

## 2022-06-22 DIAGNOSIS — R488 Other symbolic dysfunctions: Secondary | ICD-10-CM | POA: Diagnosis not present

## 2022-06-22 DIAGNOSIS — M6281 Muscle weakness (generalized): Secondary | ICD-10-CM | POA: Diagnosis not present

## 2022-06-22 DIAGNOSIS — R41841 Cognitive communication deficit: Secondary | ICD-10-CM | POA: Diagnosis not present

## 2022-06-22 DIAGNOSIS — R2689 Other abnormalities of gait and mobility: Secondary | ICD-10-CM | POA: Diagnosis not present

## 2022-06-22 DIAGNOSIS — R2681 Unsteadiness on feet: Secondary | ICD-10-CM | POA: Diagnosis not present

## 2022-06-23 DIAGNOSIS — R2681 Unsteadiness on feet: Secondary | ICD-10-CM | POA: Diagnosis not present

## 2022-06-23 DIAGNOSIS — R41841 Cognitive communication deficit: Secondary | ICD-10-CM | POA: Diagnosis not present

## 2022-06-23 DIAGNOSIS — R488 Other symbolic dysfunctions: Secondary | ICD-10-CM | POA: Diagnosis not present

## 2022-06-23 DIAGNOSIS — R2689 Other abnormalities of gait and mobility: Secondary | ICD-10-CM | POA: Diagnosis not present

## 2022-06-23 DIAGNOSIS — M6281 Muscle weakness (generalized): Secondary | ICD-10-CM | POA: Diagnosis not present

## 2022-06-26 DIAGNOSIS — R2689 Other abnormalities of gait and mobility: Secondary | ICD-10-CM | POA: Diagnosis not present

## 2022-06-26 DIAGNOSIS — M6281 Muscle weakness (generalized): Secondary | ICD-10-CM | POA: Diagnosis not present

## 2022-06-26 DIAGNOSIS — R41841 Cognitive communication deficit: Secondary | ICD-10-CM | POA: Diagnosis not present

## 2022-06-26 DIAGNOSIS — R488 Other symbolic dysfunctions: Secondary | ICD-10-CM | POA: Diagnosis not present

## 2022-06-26 DIAGNOSIS — R2681 Unsteadiness on feet: Secondary | ICD-10-CM | POA: Diagnosis not present

## 2022-06-27 DIAGNOSIS — R41841 Cognitive communication deficit: Secondary | ICD-10-CM | POA: Diagnosis not present

## 2022-06-27 DIAGNOSIS — R2689 Other abnormalities of gait and mobility: Secondary | ICD-10-CM | POA: Diagnosis not present

## 2022-06-27 DIAGNOSIS — R488 Other symbolic dysfunctions: Secondary | ICD-10-CM | POA: Diagnosis not present

## 2022-06-27 DIAGNOSIS — M6281 Muscle weakness (generalized): Secondary | ICD-10-CM | POA: Diagnosis not present

## 2022-06-27 DIAGNOSIS — R2681 Unsteadiness on feet: Secondary | ICD-10-CM | POA: Diagnosis not present

## 2022-06-28 DIAGNOSIS — R2689 Other abnormalities of gait and mobility: Secondary | ICD-10-CM | POA: Diagnosis not present

## 2022-06-28 DIAGNOSIS — R41841 Cognitive communication deficit: Secondary | ICD-10-CM | POA: Diagnosis not present

## 2022-06-28 DIAGNOSIS — R488 Other symbolic dysfunctions: Secondary | ICD-10-CM | POA: Diagnosis not present

## 2022-06-28 DIAGNOSIS — M6281 Muscle weakness (generalized): Secondary | ICD-10-CM | POA: Diagnosis not present

## 2022-06-28 DIAGNOSIS — R2681 Unsteadiness on feet: Secondary | ICD-10-CM | POA: Diagnosis not present

## 2022-06-29 DIAGNOSIS — R488 Other symbolic dysfunctions: Secondary | ICD-10-CM | POA: Diagnosis not present

## 2022-06-29 DIAGNOSIS — R2681 Unsteadiness on feet: Secondary | ICD-10-CM | POA: Diagnosis not present

## 2022-06-29 DIAGNOSIS — R41841 Cognitive communication deficit: Secondary | ICD-10-CM | POA: Diagnosis not present

## 2022-06-29 DIAGNOSIS — M6281 Muscle weakness (generalized): Secondary | ICD-10-CM | POA: Diagnosis not present

## 2022-06-29 DIAGNOSIS — R2689 Other abnormalities of gait and mobility: Secondary | ICD-10-CM | POA: Diagnosis not present

## 2022-06-30 DIAGNOSIS — R2689 Other abnormalities of gait and mobility: Secondary | ICD-10-CM | POA: Diagnosis not present

## 2022-06-30 DIAGNOSIS — M6281 Muscle weakness (generalized): Secondary | ICD-10-CM | POA: Diagnosis not present

## 2022-06-30 DIAGNOSIS — R41841 Cognitive communication deficit: Secondary | ICD-10-CM | POA: Diagnosis not present

## 2022-06-30 DIAGNOSIS — R2681 Unsteadiness on feet: Secondary | ICD-10-CM | POA: Diagnosis not present

## 2022-06-30 DIAGNOSIS — R488 Other symbolic dysfunctions: Secondary | ICD-10-CM | POA: Diagnosis not present

## 2022-07-02 IMAGING — MG MM DIGITAL SCREENING BILAT W/ TOMO AND CAD
8 series · 9 of 24 positions shown · non-contrast
Comparison: Previous exam(s).

CLINICAL DATA: Screening.

EXAM:
DIGITAL SCREENING BILATERAL MAMMOGRAM WITH TOMOSYNTHESIS AND CAD
TECHNIQUE: Bilateral screening digital craniocaudal and mediolateral oblique
mammograms were obtained. Bilateral screening digital breast
tomosynthesis was performed. The images were evaluated with
computer-aided detection.

[R MLO synth-2D]
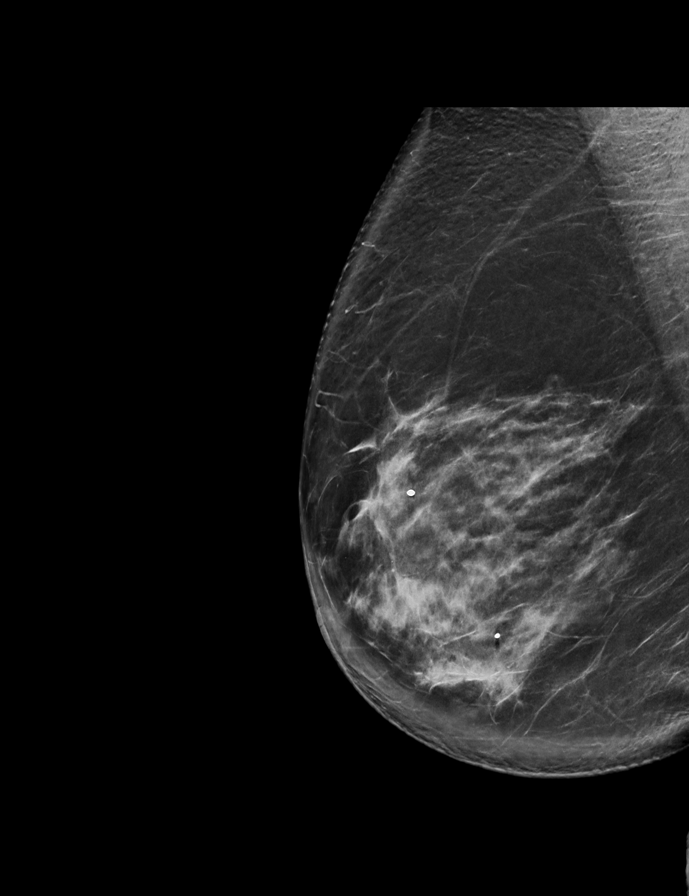

[L MLO synth-2D]
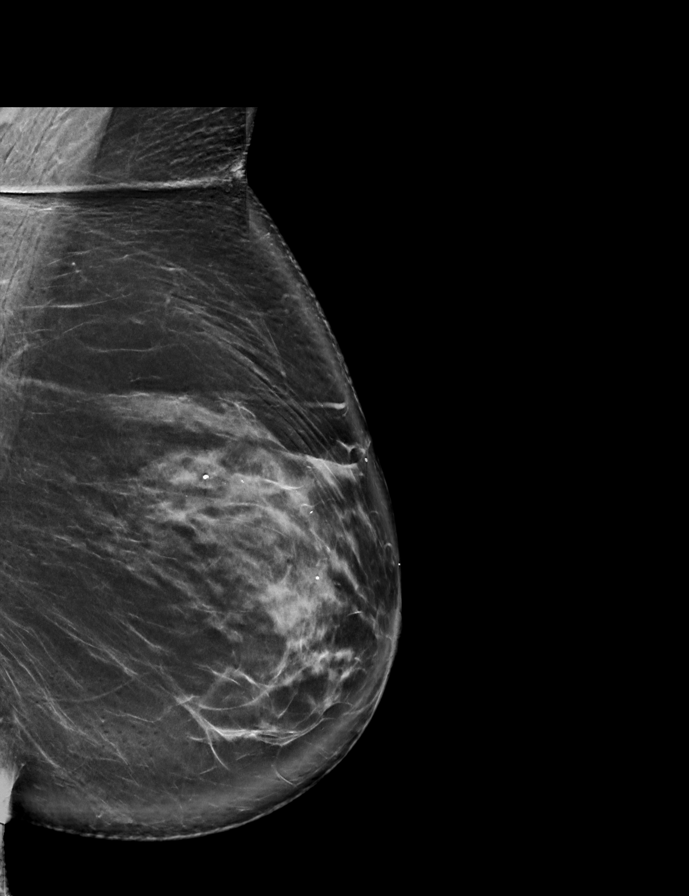

[R CC synth-2D]
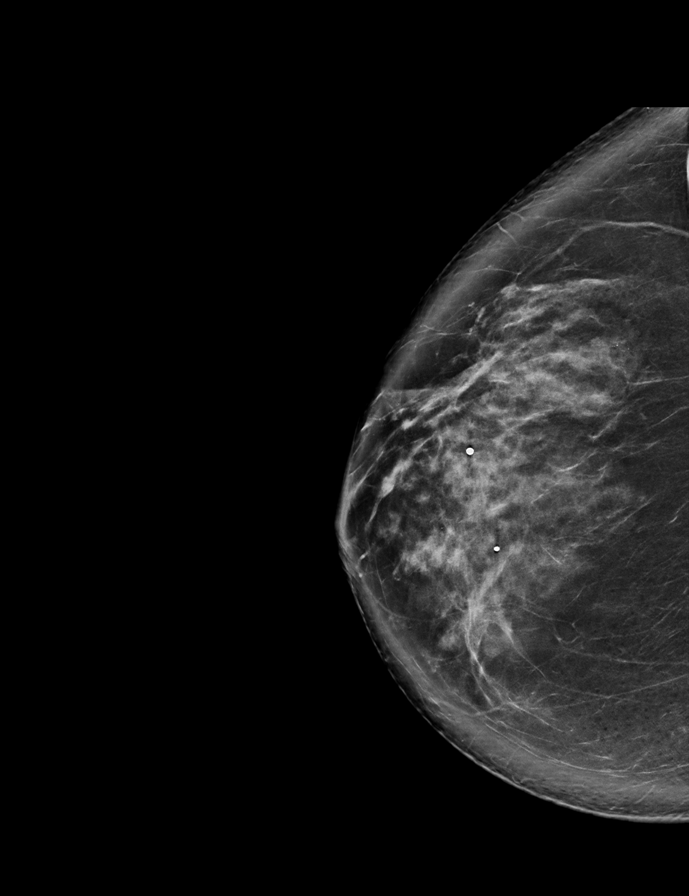

[L CC synth-2D]
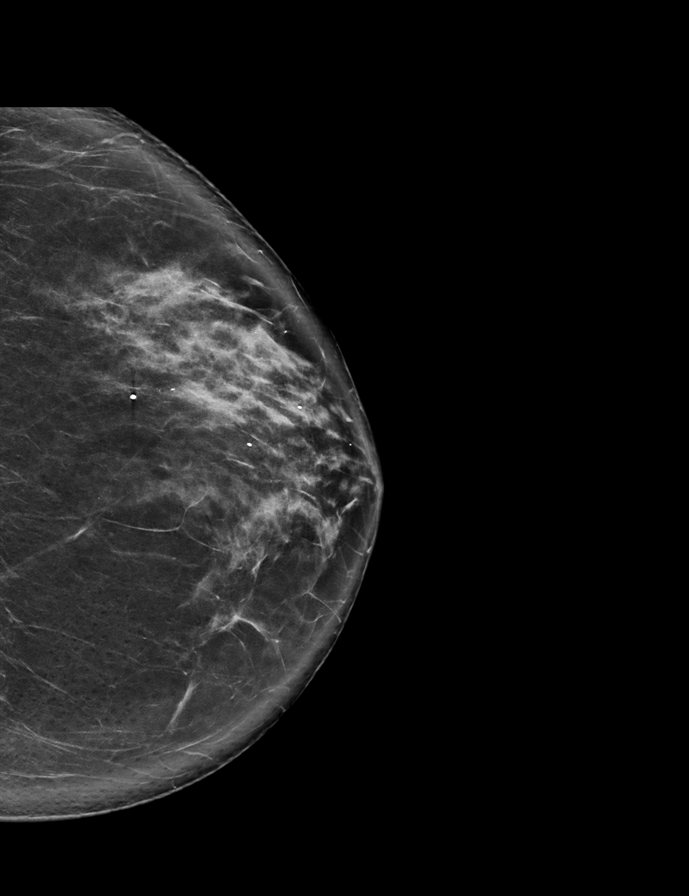

[L CC tomo · 2 of 78 frames shown]
[frame 26/78]
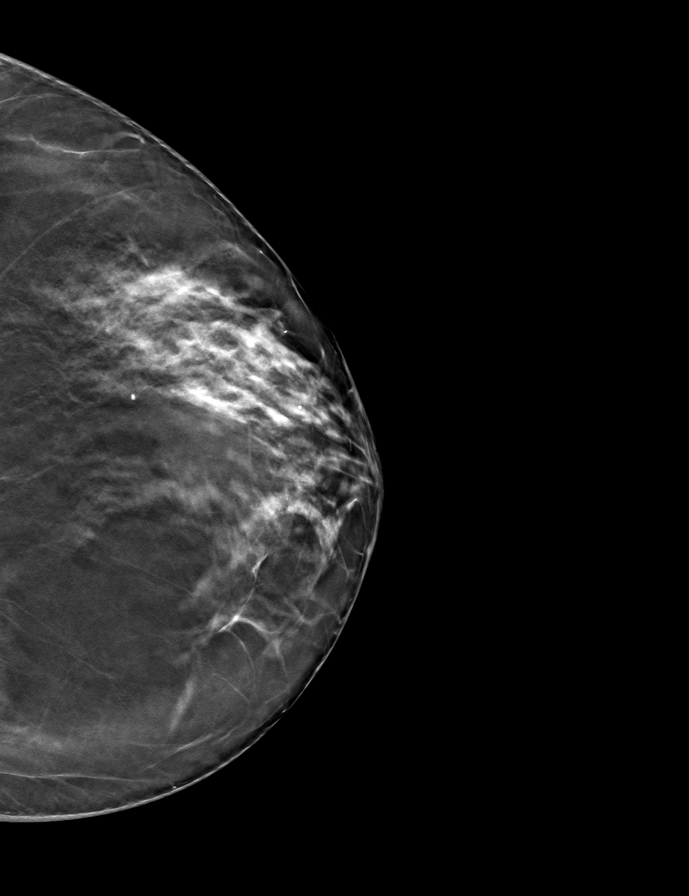
[frame 39/78]
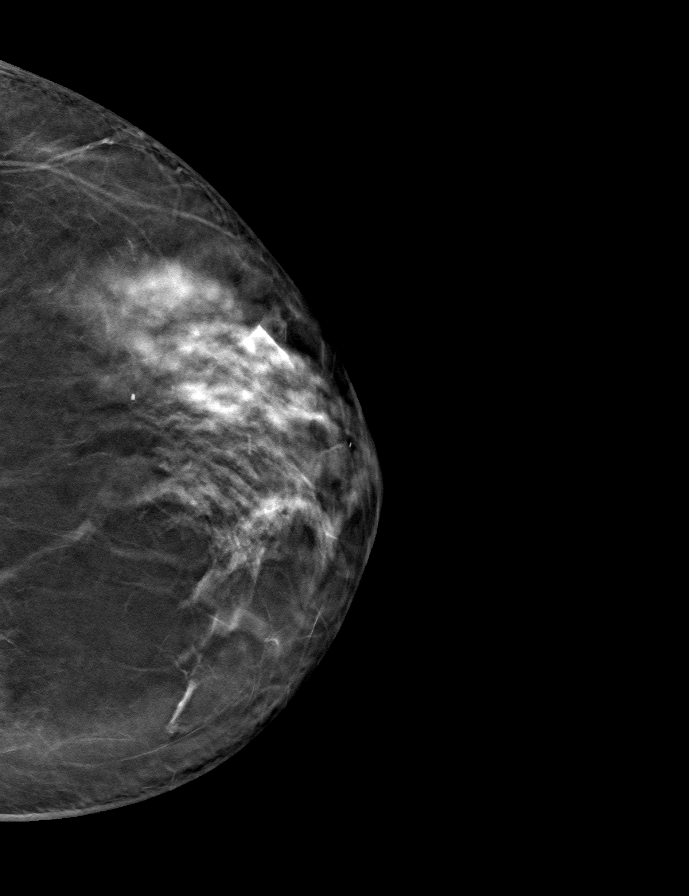

[R CC tomo · tomo slice 45/88.0]
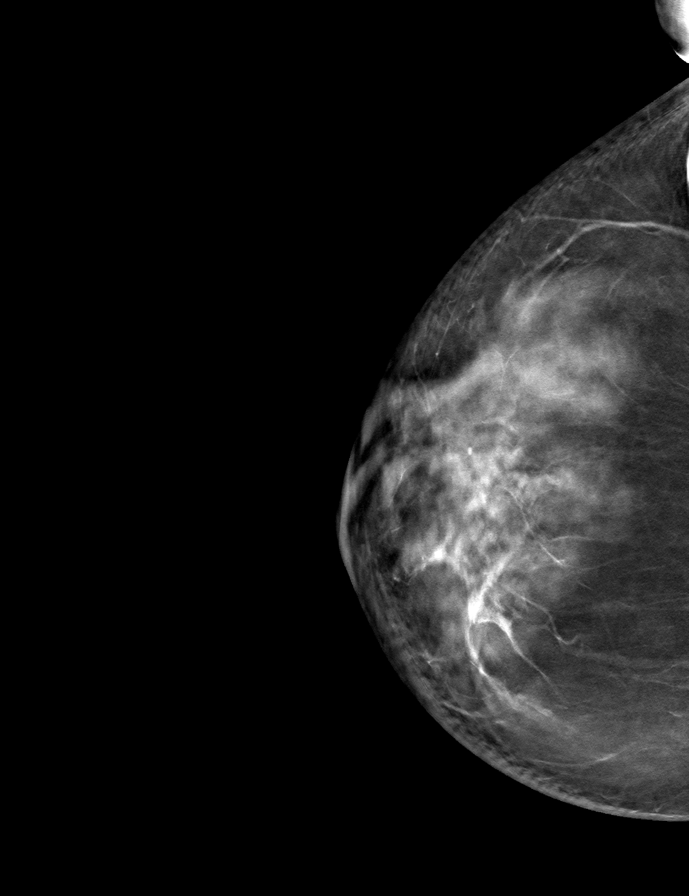

[R MLO tomo · tomo slice 45/90.0]
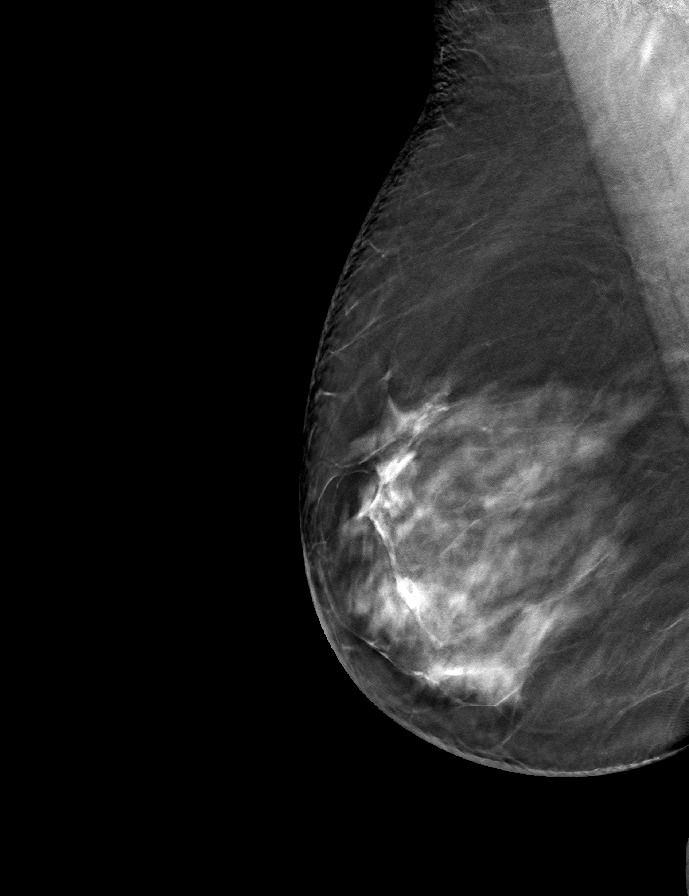

[L MLO tomo · tomo slice 47/92.0]
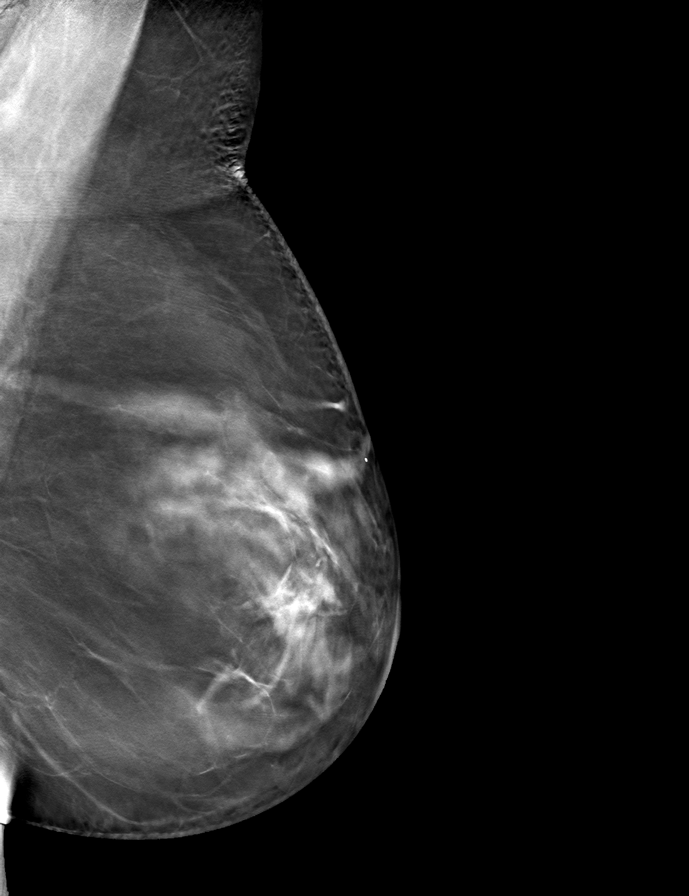

[9 of 24 positions shown; findings below may reference images not displayed]

ACR Breast Density Category c: The breast tissue is heterogeneously
dense, which may obscure small masses.
FINDINGS: There are no findings suspicious for malignancy.
IMPRESSION: No mammographic evidence of malignancy. A result letter of this
screening mammogram will be mailed directly to the patient.

RECOMMENDATION:
Screening mammogram in one year. (Code:[id number])

BI-RADS CATEGORY  1: Negative.

## 2022-07-03 DIAGNOSIS — R2689 Other abnormalities of gait and mobility: Secondary | ICD-10-CM | POA: Diagnosis not present

## 2022-07-03 DIAGNOSIS — R2681 Unsteadiness on feet: Secondary | ICD-10-CM | POA: Diagnosis not present

## 2022-07-03 DIAGNOSIS — M6281 Muscle weakness (generalized): Secondary | ICD-10-CM | POA: Diagnosis not present

## 2022-07-03 DIAGNOSIS — R488 Other symbolic dysfunctions: Secondary | ICD-10-CM | POA: Diagnosis not present

## 2022-07-03 DIAGNOSIS — R41841 Cognitive communication deficit: Secondary | ICD-10-CM | POA: Diagnosis not present

## 2022-07-04 DIAGNOSIS — R2689 Other abnormalities of gait and mobility: Secondary | ICD-10-CM | POA: Diagnosis not present

## 2022-07-04 DIAGNOSIS — M6281 Muscle weakness (generalized): Secondary | ICD-10-CM | POA: Diagnosis not present

## 2022-07-04 DIAGNOSIS — R41841 Cognitive communication deficit: Secondary | ICD-10-CM | POA: Diagnosis not present

## 2022-07-04 DIAGNOSIS — R2681 Unsteadiness on feet: Secondary | ICD-10-CM | POA: Diagnosis not present

## 2022-07-04 DIAGNOSIS — R488 Other symbolic dysfunctions: Secondary | ICD-10-CM | POA: Diagnosis not present

## 2022-07-05 DIAGNOSIS — R488 Other symbolic dysfunctions: Secondary | ICD-10-CM | POA: Diagnosis not present

## 2022-07-05 DIAGNOSIS — M6281 Muscle weakness (generalized): Secondary | ICD-10-CM | POA: Diagnosis not present

## 2022-07-05 DIAGNOSIS — R2689 Other abnormalities of gait and mobility: Secondary | ICD-10-CM | POA: Diagnosis not present

## 2022-07-05 DIAGNOSIS — R2681 Unsteadiness on feet: Secondary | ICD-10-CM | POA: Diagnosis not present

## 2022-07-05 DIAGNOSIS — R41841 Cognitive communication deficit: Secondary | ICD-10-CM | POA: Diagnosis not present

## 2022-07-06 DIAGNOSIS — R488 Other symbolic dysfunctions: Secondary | ICD-10-CM | POA: Diagnosis not present

## 2022-07-06 DIAGNOSIS — R2689 Other abnormalities of gait and mobility: Secondary | ICD-10-CM | POA: Diagnosis not present

## 2022-07-06 DIAGNOSIS — R41841 Cognitive communication deficit: Secondary | ICD-10-CM | POA: Diagnosis not present

## 2022-07-06 DIAGNOSIS — M6281 Muscle weakness (generalized): Secondary | ICD-10-CM | POA: Diagnosis not present

## 2022-07-06 DIAGNOSIS — R2681 Unsteadiness on feet: Secondary | ICD-10-CM | POA: Diagnosis not present

## 2022-07-07 DIAGNOSIS — R2689 Other abnormalities of gait and mobility: Secondary | ICD-10-CM | POA: Diagnosis not present

## 2022-07-07 DIAGNOSIS — M6281 Muscle weakness (generalized): Secondary | ICD-10-CM | POA: Diagnosis not present

## 2022-07-07 DIAGNOSIS — R41841 Cognitive communication deficit: Secondary | ICD-10-CM | POA: Diagnosis not present

## 2022-07-07 DIAGNOSIS — R488 Other symbolic dysfunctions: Secondary | ICD-10-CM | POA: Diagnosis not present

## 2022-07-07 DIAGNOSIS — R2681 Unsteadiness on feet: Secondary | ICD-10-CM | POA: Diagnosis not present

## 2022-07-09 ENCOUNTER — Ambulatory Visit
Admission: EM | Admit: 2022-07-09 | Discharge: 2022-07-09 | Disposition: A | Discharge status: Home / Self Care | Payer: Medicare Other | Attending: Emergency Medicine | Admitting: Emergency Medicine

## 2022-07-09 DIAGNOSIS — N3090 Cystitis, unspecified without hematuria: Secondary | ICD-10-CM | POA: Diagnosis not present

## 2022-07-09 DIAGNOSIS — R4182 Altered mental status, unspecified: Secondary | ICD-10-CM | POA: Diagnosis not present

## 2022-07-09 LAB — POCT URINALYSIS DIP (MANUAL ENTRY)
Bilirubin, UA: NEGATIVE
Glucose, UA: NEGATIVE mg/dL
Nitrite, UA: NEGATIVE
Protein Ur, POC: 100 mg/dL — AB
Spec Grav, UA: >=1.03 — AB (ref 1.010–1.025)
Urobilinogen, UA: 0.2 E.U./dL
pH, UA: 6 (ref 5.0–8.0)

## 2022-07-09 MED ORDER — CEFDINIR 300 MG PO CAPS: 300.0000 mg | ORAL_CAPSULE | Freq: Two times a day (BID) | ORAL | 0 refills | Status: AC

## 2022-07-09 NOTE — ED Provider Notes (Signed)
[facility]    CSN: [id number] Arrival date & time: 07/09/22  1117    HISTORY   Chief Complaint  Patient presents with   Altered Mental Status   Weakness   HPI Felicia Kelly is a pleasant, 86 y.o. female who presents to urgent care today. Patient returns to urgent care with her sister.  Sister states that patient lives in an assisted living facility and is concerned because she appears to be more confused and weak than usual.  Sister states patient has a history of frequent urinary tract infections and would like her evaluated for UTI at this time.  On arrival, patient has decreased O2 sats and a low temperature of 97.5, both of which are inconsistent with vital signs obtained during previous visits here at urgent care.  Patient states that her sister was diagnosed with COVID-19 last month and seems to be slowly recovering.  The history is provided by the patient.   Past Medical History:  Diagnosis Date   Aortic atherosclerosis ([place])    a. noted on CT 01/2018.   Arthritis    Colonic polyp    Coronary atherosclerosis    a. noted on CT 01/2018.   Genital herpes    Hyperlipidemia    Hypertension    Mild carotid artery disease ([facility])    Osteoporosis    Overdose of cardiac medication    Pulmonary nodules    a. followed by PCP by serial Ct.   Thyroid disease    seeing Dr. 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   2019 novel coronavirus disease (COVID-19) 05/28/2022   Hypocalcemia 05/28/2022   Hypoglycemia 05/28/2022   SAH (subarachnoid hemorrhage) ([place]) 02/24/2020   Bradycardia 04/01/2019   Cystocele 02/13/2019   Hip pain 02/13/2019   Osteoarthritis of hip 02/13/2019   Subchondral cysts 02/13/2019   Vitamin D deficiency 02/13/2019   Aortic atherosclerosis ([place]) 10/14/2017   Solitary pulmonary nodule 10/23/2016   Hyperglycemia 07/11/2016    Hypercholesterolemia 06/04/2013   Hypothyroidism 06/04/2013   Osteopenia 06/27/2012   Malignant basal cell neoplasm of skin 02/14/2012   Benign essential hypertension 11/22/2011   Internal carotid artery stenosis 09/18/2011   Multinodular goiter 10/16/2009   Herpes simplex type 2 infection 10/05/2008   Single renal cyst 02/14/2008   Past Surgical History:  Procedure Laterality Date   ABDOMINAL HYSTERECTOMY  1988   no cancer - reports total with bilat oophorectomy   APPENDECTOMY  1988   BREAST CYST EXCISION Bilateral    BREAST SURGERY  1985   biopsy   FOOT SURGERY     multiple sugeries, remote   TOTAL HIP ARTHROPLASTY Bilateral    OB History   No obstetric history on file.    Home Medications    Prior to Admission medications   Medication Sig Start Date End Date Taking? Authorizing Provider  aspirin EC 81 MG tablet Take 81 mg by mouth every evening. Swallow whole.    [provider]  Cholecalciferol (VITAMIN D-3) 25 MCG (1000 UT) CAPS Take 1,000 Units by mouth every Monday, Wednesday, and Friday.    [provider]  cyanocobalamin 100 MCG tablet Take 100 mcg by mouth daily.    [provider]  ezetimibe (ZETIA) 10 MG tablet Take 1 tablet (10 mg total) by mouth daily. 02/14/22   [name], MD  levothyroxine (SYNTHROID) 50 MCG tablet  Take 50-100 mcg by mouth See admin instructions. Take 100 mcg by mouth in the morning before breakfast on Sundays and 50 mcg on Mon/Tues/Wed/Thurs/Fri/Sat    [provider]  Magnesium 250 MG TABS Take 250 mg by mouth daily.    [provider]  melatonin 1 MG TABS tablet Take 1 mg by mouth at bedtime.    [provider]  METAMUCIL FIBER PO Take 1 capsule by mouth every evening.    [provider]  metFORMIN (GLUCOPHAGE-XR) 500 MG 24 hr tablet Take 1 tablet (500 mg total) by mouth daily with breakfast. 02/16/22   Koberlein, Steele Berg, MD  mirtazapine (REMERON SOL-TAB) 15 MG  disintegrating tablet Take 1 tablet (15 mg total) by mouth at bedtime. Start with 1/2 tablet at bedtime for 7 days. If no adverse reactions then may increase to 1 tablet at bedtime. 06/08/22   Farrel Conners, MD  Multiple Vitamin (MULTIVITAMIN) tablet Take 1 tablet by mouth daily with breakfast.    [provider]  Multiple Vitamins-Minerals ([place]) CAPS Take 1 capsule by mouth in the morning.    [provider]  ondansetron (ZOFRAN-ODT) 4 MG disintegrating tablet Take 1 tablet (4 mg total) by mouth every 8 (eight) hours as needed for nausea or vomiting. 06/02/22   Tedd Sias, PA  potassium chloride (KLOR-CON M) 10 MEQ tablet Take 1 tablet (10 mEq total) by mouth daily. 02/27/22   [name], MD  pravastatin (PRAVACHOL) 40 MG tablet TAKE 1 TABLET ONCE DAILY. Patient taking differently: Take 40 mg by mouth every evening. 02/16/22   [name], MD  Probiotic Product (PROBIOTIC PO) Take 1 capsule by mouth every evening.    [provider]  rivastigmine (EXELON) 4.5 MG capsule Take 1 capsule (4.5 mg total) by mouth 2 (two) times daily. 02/27/22   Rondel Jumbo, PA-C  valACYclovir (VALTREX) 500 MG tablet TAKE ONE TABLET BY MOUTH ONCE DAILY Patient taking differently: Take 500 mg by mouth daily. 02/16/22   Caren Macadam, MD    Family History Family History  Problem Relation Age of Onset   Pancreatic cancer Father    Heart disease Father    Heart attack Sister 49   Arthritis Mother    Hyperlipidemia Mother    Hypertension Mother    Heart disease Mother    Diabetes Mother    Liver disease Brother        cancer of bile duct   Heart disease Maternal Grandfather    Heart attack Brother    Breast cancer Neg Hx    Social History Social History   Tobacco Use   Smoking status: Former    Types: Cigarettes    Quit date: 10/16/1978    Years since quitting: 43.7   Smokeless tobacco: Never   Tobacco comments:    01-14-62 when  her father died;  smoked for a brief period of time  Vaping Use   Vaping Use: Never used  Substance Use Topics   Alcohol use: No    Alcohol/week: 0.0 standard drinks of alcohol   Drug use: No   Allergies   Tetracycline, Atorvastatin, Bactrim [sulfamethoxazole-trimethoprim], Celecoxib, Niacin, Rosuvastatin, Simvastatin, Tetracyclines & related, and Penicillins  Review of Systems Review of Systems Pertinent findings revealed after performing a 14 point review of systems has been noted in the history of present illness.  Physical Exam Triage Vital Signs ED Triage Vitals  Enc Vitals Group  BP 08/12/21 0827 (!) 147/82     Pulse Rate 08/12/21 0827 72     Resp 08/12/21 0827 18     Temp 08/12/21 0827 98.3 F (36.8 C)     Temp Source 08/12/21 0827 Oral     SpO2 08/12/21 0827 98 %     Weight --      Height --      Head Circumference --      Peak Flow --      Pain Score 08/12/21 0826 5     Pain Loc --      Pain Edu? --      Excl. in [place]? --   No data found.  Updated Vital Signs BP (!) 144/83 (BP Location: Left Arm)   Pulse 88   Temp (!) 97.5 F (36.4 C) (Oral)   Resp 16   SpO2 93%   Physical Exam Vitals and nursing note reviewed.  Constitutional:      General: She is awake. She is not in acute distress.    Appearance: Normal appearance. She is well-developed and well-groomed. She is not ill-appearing.     Comments: Patient ambulating with cane  HENT:     Head: Normocephalic and atraumatic.     Salivary Glands: Right salivary gland is not diffusely enlarged or tender. Left salivary gland is not diffusely enlarged or tender.     Right Ear: Tympanic membrane, ear canal and external ear normal. No drainage. No middle ear effusion. There is no impacted cerumen. Tympanic membrane is not erythematous or bulging.     Left Ear: Tympanic membrane, ear canal and external ear normal. No drainage.  No middle ear effusion. There is no impacted cerumen. Tympanic membrane is not  erythematous or bulging.     Nose: Nose normal. No nasal deformity, septal deviation, mucosal edema, congestion or rhinorrhea.     Right Turbinates: Not enlarged, swollen or pale.     Left Turbinates: Not enlarged, swollen or pale.     Right Sinus: No maxillary sinus tenderness or frontal sinus tenderness.     Left Sinus: No maxillary sinus tenderness or frontal sinus tenderness.     Mouth/Throat:     Lips: Pink. No lesions.     Mouth: Mucous membranes are moist. No oral lesions.     Pharynx: Oropharynx is clear. Uvula midline. No posterior oropharyngeal erythema or uvula swelling.     Tonsils: No tonsillar exudate. 0 on the right. 0 on the left.  Eyes:     General: Lids are normal.        Right eye: No discharge.        Left eye: No discharge.     Extraocular Movements: Extraocular movements intact.     Conjunctiva/sclera: Conjunctivae normal.     Right eye: Right conjunctiva is not injected.     Left eye: Left conjunctiva is not injected.     Pupils: Pupils are equal, round, and reactive to light.  Neck:     Trachea: Trachea and phonation normal.  Cardiovascular:     Rate and Rhythm: Normal rate and regular rhythm.     Pulses: Normal pulses.     Heart sounds: Normal heart sounds. No murmur heard.    No friction rub. No gallop.  Pulmonary:     Effort: Pulmonary effort is normal. No accessory muscle usage, prolonged expiration or respiratory distress.     Breath sounds: Normal breath sounds. No stridor, decreased air movement or transmitted upper airway sounds.  No decreased breath sounds, wheezing, rhonchi or rales.  Chest:     Chest wall: No tenderness.  Abdominal:     General: Abdomen is flat. Bowel sounds are normal. There is no distension.     Palpations: Abdomen is soft. There is no mass.     Tenderness: There is no abdominal tenderness. There is no right CVA tenderness, left CVA tenderness, guarding or rebound.     Hernia: No hernia is present.  Musculoskeletal:         General: Normal range of motion.     Cervical back: Normal range of motion and neck supple. Normal range of motion.  Lymphadenopathy:     Cervical: No cervical adenopathy.  Skin:    General: Skin is warm and dry.     Findings: No erythema or rash.  Neurological:     General: No focal deficit present.     Mental Status: She is alert. Mental status is at baseline.  Psychiatric:        Attention and Perception: She is inattentive.        Mood and Affect: Mood normal.        Speech: Speech normal.        Behavior: Behavior normal. Behavior is cooperative.        Thought Content: Thought content normal.        Cognition and Memory: Memory is impaired.        Judgment: Judgment normal.     Visual Acuity Right Eye Distance:   Left Eye Distance:   Bilateral Distance:    Right Eye Near:   Left Eye Near:    Bilateral Near:     [facility] / Diagnostics / Procedures:     Radiology No results found.  Procedures Procedures (including critical care time) EKG  Pending results:  Labs Reviewed  POCT URINALYSIS DIP (MANUAL ENTRY) - Abnormal; Notable for the following components:      Result Value   Clarity, UA cloudy (*)    Ketones, POC UA small (15) (*)    Spec Grav, UA >=1.030 (*)    Blood, UA trace-intact (*)    Protein Ur, POC =100 (*)    Leukocytes, UA Small (1+) (*)    All other components within normal limits  URINE CULTURE    Medications Ordered in UC: Medications - No data to display  UC Diagnoses / Final Clinical Impressions(s)   I have reviewed the triage vital signs and the nursing notes.  Pertinent labs & imaging results that were available during my care of the patient were reviewed by me and considered in my medical decision making (see chart for details).    Final diagnoses:  Cystitis  Altered mental status, unspecified altered mental status type   Urinalysis concerning for dehydration as well as possible UTI.  Urine will be sent for culture given  patient's age and frequency of UTI symptoms in the past.  Cefdinir started now due to patient being allergic to sulfa and penicillin, patient has tolerated Keflex in the past.  Will defer viral testing given recent infection with COVID-19.  Sister advised to monitor her for shortness of breath or increasing confusion despite treatment.  ED Prescriptions     Medication Sig Dispense Auth. Provider   cefdinir (OMNICEF) 300 MG capsule Take 1 capsule (300 mg total) by mouth 2 (two) times daily for 5 days. 10 capsule Lynden Oxford Scales, PA-C      PDMP not reviewed this encounter.  Pending results:  Labs Reviewed  POCT URINALYSIS DIP (MANUAL ENTRY) - Abnormal; Notable for the following components:      Result Value   Clarity, UA cloudy (*)    Ketones, POC UA small (15) (*)    Spec Grav, UA >=1.030 (*)    Blood, UA trace-intact (*)    Protein Ur, POC =100 (*)    Leukocytes, UA Small (1+) (*)    All other components within normal limits  URINE CULTURE    Discharge Instructions:   Discharge Instructions      The urinalysis that we performed in the clinic today was abnormal.  Urine culture will be performed per our protocol.  The result of the urine culture will be available in the next 3 to 5 days and will be posted to your MyChart account.  If there is an abnormal finding, you will be contacted by phone and advised of further treatment recommendations, if any.   You were advised to begin antibiotics today because your urinalysis is concerning for an acute lower urinary tract infection also known as cystitis.  It is very important that you take all doses exactly as prescribed.  Incomplete antibiotic therapy can cause worsening urinary tract infection that can become aggressive, escape from urinary tract into your bloodstream causing sepsis which will require hospitalization.   Please pick up and begin taking your prescription for Omnicef (cefdinir) as soon as possible.  Please take all  doses exactly as prescribed.  You can take this medication with or without food.  This medication is safe to take with your other medications.   You received a COVID-19 PCR test today.  The result of your COVID-19 test will be posted to your MyChart once it is complete, typically this takes 24 to 36 hours.     If your COVID-19 PCR test is negative, please consider retesting in the next 2 to 3 days, particularly if you are not feeling any better.  You are welcome to return here to urgent care to have it done or you can take a home COVID-19 test.    If your COVID-19 tests remain negative after 3 tests, each test taken 2 days apart, whether they are PCR or antigen tests are both, then your illness is likely due to one of the many less serious illnesses circulating in our community right now.  Conservative care is recommended with rest, drinking plenty of clear fluids, eating only when hungry, taking supportive medications for your symptoms and avoiding being around other people.  Please remain at home until you are fever free for 24 hours without the use of antifever medications such as Tylenol and ibuprofen.     If you have not had complete resolution of your symptoms after completing treatment as prescribed, please return to urgent care for repeat evaluation or follow-up with your primary care provider.   Thank you for visiting urgent care today.  I appreciate the opportunity to participate in your care.       Disposition Upon Discharge:  Condition: stable for discharge home  Patient presented with an acute illness with associated systemic symptoms and significant discomfort requiring urgent management. In my opinion, this is a condition that a prudent lay person (someone who possesses an average knowledge of health and medicine) may potentially expect to result in complications if not addressed urgently such as respiratory distress, impairment of bodily function or dysfunction of bodily organs.    Routine symptom specific, illness specific and/or disease specific  instructions were discussed with the patient and/or caregiver at length.   As such, the patient has been evaluated and assessed, work-up was performed and treatment was provided in alignment with urgent care protocols and evidence based medicine.  Patient/parent/caregiver has been advised that the patient may require follow up for further testing and treatment if the symptoms continue in spite of treatment, as clinically indicated and appropriate.  Patient/parent/caregiver has been advised to return to the [facility] or PCP if no better; to PCP or the Emergency Department if new signs and symptoms develop, or if the current signs or symptoms continue to change or worsen for further workup, evaluation and treatment as clinically indicated and appropriate  The patient will follow up with their current PCP if and as advised. If the patient does not currently have a PCP we will assist them in obtaining one.   The patient may need specialty follow up if the symptoms continue, in spite of conservative treatment and management, for further workup, evaluation, consultation and treatment as clinically indicated and appropriate.   Patient/parent/caregiver verbalized understanding and agreement of plan as discussed.  All questions were addressed during visit.  Please see discharge instructions below for further details of plan.  This office note has been dictated using Museum/gallery curator.  Unfortunately, this method of dictation can sometimes lead to typographical or grammatical errors.  I apologize for your inconvenience in advance if this occurs.  Please do not hesitate to reach out to me if clarification is needed.      Lynden Oxford Scales, PA-C 07/09/22 1323

## 2022-07-09 NOTE — Discharge Instructions (Addendum)
The urinalysis that we performed in the clinic today was abnormal.  Urine culture will be performed per our protocol.  The result of the urine culture will be available in the next 3 to 5 days and will be posted to your MyChart account.  If there is an abnormal finding, you will be contacted by phone and advised of further treatment recommendations, if any.   You were advised to begin antibiotics today because your urinalysis is concerning for an acute lower urinary tract infection also known as cystitis.  It is very important that you take all doses exactly as prescribed.  Incomplete antibiotic therapy can cause worsening urinary tract infection that can become aggressive, escape from urinary tract into your bloodstream causing sepsis which will require hospitalization.   Please pick up and begin taking your prescription for Omnicef (cefdinir) as soon as possible.  Please take all doses exactly as prescribed.  You can take this medication with or without food.  This medication is safe to take with your other medications.   You received a COVID-19 PCR test today.  The result of your COVID-19 test will be posted to your MyChart once it is complete, typically this takes 24 to 36 hours.     If your COVID-19 PCR test is negative, please consider retesting in the next 2 to 3 days, particularly if you are not feeling any better.  You are welcome to return here to urgent care to have it done or you can take a home COVID-19 test.    If your COVID-19 tests remain negative after 3 tests, each test taken 2 days apart, whether they are PCR or antigen tests are both, then your illness is likely due to one of the many less serious illnesses circulating in our community right now.  Conservative care is recommended with rest, drinking plenty of clear fluids, eating only when hungry, taking supportive medications for your symptoms and avoiding being around other people.  Please remain at home until you are fever free for  24 hours without the use of antifever medications such as Tylenol and ibuprofen.     If you have not had complete resolution of your symptoms after completing treatment as prescribed, please return to urgent care for repeat evaluation or follow-up with your primary care provider.   Thank you for visiting urgent care today.  I appreciate the opportunity to participate in your care.

## 2022-07-09 NOTE — ED Triage Notes (Signed)
Pts sister states the assistant living facility called her and states the pt has been more confused and weak.  Started: this morning

## 2022-07-10 DIAGNOSIS — R2689 Other abnormalities of gait and mobility: Secondary | ICD-10-CM | POA: Diagnosis not present

## 2022-07-10 DIAGNOSIS — R41841 Cognitive communication deficit: Secondary | ICD-10-CM | POA: Diagnosis not present

## 2022-07-10 DIAGNOSIS — R2681 Unsteadiness on feet: Secondary | ICD-10-CM | POA: Diagnosis not present

## 2022-07-10 DIAGNOSIS — R488 Other symbolic dysfunctions: Secondary | ICD-10-CM | POA: Diagnosis not present

## 2022-07-10 DIAGNOSIS — M6281 Muscle weakness (generalized): Secondary | ICD-10-CM | POA: Diagnosis not present

## 2022-07-10 LAB — URINE CULTURE: Culture: NO GROWTH

## 2022-07-11 DIAGNOSIS — R2681 Unsteadiness on feet: Secondary | ICD-10-CM | POA: Diagnosis not present

## 2022-07-11 DIAGNOSIS — M6281 Muscle weakness (generalized): Secondary | ICD-10-CM | POA: Diagnosis not present

## 2022-07-11 DIAGNOSIS — R488 Other symbolic dysfunctions: Secondary | ICD-10-CM | POA: Diagnosis not present

## 2022-07-11 DIAGNOSIS — R2689 Other abnormalities of gait and mobility: Secondary | ICD-10-CM | POA: Diagnosis not present

## 2022-07-11 DIAGNOSIS — R41841 Cognitive communication deficit: Secondary | ICD-10-CM | POA: Diagnosis not present

## 2022-07-12 DIAGNOSIS — R488 Other symbolic dysfunctions: Secondary | ICD-10-CM | POA: Diagnosis not present

## 2022-07-12 DIAGNOSIS — R41841 Cognitive communication deficit: Secondary | ICD-10-CM | POA: Diagnosis not present

## 2022-07-12 DIAGNOSIS — R2689 Other abnormalities of gait and mobility: Secondary | ICD-10-CM | POA: Diagnosis not present

## 2022-07-12 DIAGNOSIS — R2681 Unsteadiness on feet: Secondary | ICD-10-CM | POA: Diagnosis not present

## 2022-07-12 DIAGNOSIS — M6281 Muscle weakness (generalized): Secondary | ICD-10-CM | POA: Diagnosis not present

## 2022-07-12 DIAGNOSIS — Z23 Encounter for immunization: Secondary | ICD-10-CM | POA: Diagnosis not present

## 2022-07-13 DIAGNOSIS — R41841 Cognitive communication deficit: Secondary | ICD-10-CM | POA: Diagnosis not present

## 2022-07-13 DIAGNOSIS — R488 Other symbolic dysfunctions: Secondary | ICD-10-CM | POA: Diagnosis not present

## 2022-07-13 DIAGNOSIS — M6281 Muscle weakness (generalized): Secondary | ICD-10-CM | POA: Diagnosis not present

## 2022-07-13 DIAGNOSIS — R2681 Unsteadiness on feet: Secondary | ICD-10-CM | POA: Diagnosis not present

## 2022-07-13 DIAGNOSIS — R2689 Other abnormalities of gait and mobility: Secondary | ICD-10-CM | POA: Diagnosis not present

## 2022-07-16 ENCOUNTER — Ambulatory Visit
Admission: EM | Admit: 2022-07-16 | Discharge: 2022-07-16 | Disposition: A | Discharge status: Home / Self Care | Payer: Medicare Other | Attending: Urgent Care | Admitting: Urgent Care

## 2022-07-16 ENCOUNTER — Encounter: Payer: Self-pay | Admitting: Emergency Medicine

## 2022-07-16 DIAGNOSIS — R410 Disorientation, unspecified: Secondary | ICD-10-CM | POA: Diagnosis not present

## 2022-07-16 DIAGNOSIS — R3915 Urgency of urination: Secondary | ICD-10-CM | POA: Insufficient documentation

## 2022-07-16 LAB — POCT URINALYSIS DIP (MANUAL ENTRY)
Bilirubin, UA: NEGATIVE
Glucose, UA: NEGATIVE mg/dL
Ketones, POC UA: NEGATIVE mg/dL
Leukocytes, UA: NEGATIVE
Nitrite, UA: NEGATIVE
Protein Ur, POC: 100 mg/dL — AB
Spec Grav, UA: 1.025 (ref 1.010–1.025)
Urobilinogen, UA: 0.2 E.U./dL
pH, UA: 6 (ref 5.0–8.0)

## 2022-07-16 NOTE — ED Provider Notes (Signed)
[facility]  Note:  This document was prepared using Systems analyst and may include unintentional dictation errors.  MRN: [id number] DOB: 1936-05-19  Subjective:   Felicia Kelly is a 86 y.o. female presenting for recheck on suspected UTI. Was last seen 07/09/2022, was started on cefdinir. Urine culture was negative. Sister has concerns that she missed one dose of cefdinir and still has it left over when she should be finished with it. Also reports that the patient still appears to have confusion. No fever, dysuria, hematuria, urinary frequency and urgency. Patient does not hydrate well. She does live in assisted living but no incidents have been reported.   No current facility-administered medications for this encounter.  Current Outpatient Medications:    aspirin EC 81 MG tablet, Take 81 mg by mouth every evening. Swallow whole., Disp: , Rfl:    Cholecalciferol (VITAMIN D-3) 25 MCG (1000 UT) CAPS, Take 1,000 Units by mouth every Monday, Wednesday, and Friday., Disp: , Rfl:    cyanocobalamin 100 MCG tablet, Take 100 mcg by mouth daily., Disp: , Rfl:    ezetimibe (ZETIA) 10 MG tablet, Take 1 tablet (10 mg total) by mouth daily., Disp: 90 tablet, Rfl: 2   levothyroxine (SYNTHROID) 50 MCG tablet, Take 50-100 mcg by mouth See admin instructions. Take 100 mcg by mouth in the morning before breakfast on Sundays and 50 mcg on Mon/Tues/Wed/Thurs/Fri/Sat, Disp: , Rfl:    Magnesium 250 MG TABS, Take 250 mg by mouth daily., Disp: , Rfl:    melatonin 1 MG TABS tablet, Take 1 mg by mouth at bedtime., Disp: , Rfl:    METAMUCIL FIBER PO, Take 1 capsule by mouth every evening., Disp: , Rfl:    metFORMIN (GLUCOPHAGE-XR) 500 MG 24 hr tablet, Take 1 tablet (500 mg total) by mouth daily with breakfast., Disp: 90 tablet, Rfl: 1   mirtazapine (REMERON SOL-TAB) 15 MG disintegrating tablet, Take 1 tablet (15 mg total) by mouth at bedtime. Start with 1/2 tablet at  bedtime for 7 days. If no adverse reactions then may increase to 1 tablet at bedtime., Disp: 90 tablet, Rfl: 1   Multiple Vitamin (MULTIVITAMIN) tablet, Take 1 tablet by mouth daily with breakfast., Disp: , Rfl:    Multiple Vitamins-Minerals (OCUVITE EYE HEALTH FORMULA) CAPS, Take 1 capsule by mouth in the morning., Disp: , Rfl:    ondansetron (ZOFRAN-ODT) 4 MG disintegrating tablet, Take 1 tablet (4 mg total) by mouth every 8 (eight) hours as needed for nausea or vomiting., Disp: 20 tablet, Rfl: 0   potassium chloride (KLOR-CON M) 10 MEQ tablet, Take 1 tablet (10 mEq total) by mouth daily., Disp: 30 tablet, Rfl: 3   pravastatin (PRAVACHOL) 40 MG tablet, TAKE 1 TABLET ONCE DAILY. (Patient taking differently: Take 40 mg by mouth every evening.), Disp: 90 tablet, Rfl: 3   Probiotic Product (PROBIOTIC PO), Take 1 capsule by mouth every evening., Disp: , Rfl:    rivastigmine (EXELON) 4.5 MG capsule, Take 1 capsule (4.5 mg total) by mouth 2 (two) times daily., Disp: 60 capsule, Rfl: 11   valACYclovir (VALTREX) 500 MG tablet, TAKE ONE TABLET BY MOUTH ONCE DAILY (Patient taking differently: Take 500 mg by mouth daily.), Disp: 90 tablet, Rfl: 1   Allergies  Allergen Reactions   Tetracycline Itching   Atorvastatin Other (See Comments)    Leg cramps   Bactrim [Sulfamethoxazole-Trimethoprim] Itching   Celecoxib Other (See Comments)    Memory disturbance, slowed thinking  Niacin Other (See Comments) and Hypertension    Caused elevated BP   Rosuvastatin Other (See Comments)    Elevated CPK   Simvastatin Other (See Comments)    Leg cramps    Tetracyclines & Related Itching   Penicillins Swelling, Rash and Other (See Comments)    ALL -CILLINS, pt's sister states "swelling" reported is related to the rash she gets.  Patient tolerates cephalosporins.    Past Medical History:  Diagnosis Date   Aortic atherosclerosis ([place])    a. noted on CT 01/2018.   Arthritis    Colonic polyp    Coronary  atherosclerosis    a. noted on CT 01/2018.   Genital herpes    Hyperlipidemia    Hypertension    Mild carotid artery disease ([facility])    Osteoporosis    Overdose of cardiac medication    Pulmonary nodules    a. followed by PCP by serial Ct.   Thyroid disease    seeing Dr. Buddy Duty   Tremor      Past Surgical History:  Procedure Laterality Date   ABDOMINAL HYSTERECTOMY  1988   no cancer - reports total with bilat oophorectomy   APPENDECTOMY  1988   BREAST CYST EXCISION Bilateral    BREAST SURGERY  1985   biopsy   FOOT SURGERY     multiple sugeries, remote   TOTAL HIP ARTHROPLASTY Bilateral     Family History  Problem Relation Age of Onset   Pancreatic cancer Father    Heart disease Father    Heart attack Sister 38   Arthritis Mother    Hyperlipidemia Mother    Hypertension Mother    Heart disease Mother    Diabetes Mother    Liver disease Brother        cancer of bile duct   Heart disease Maternal Grandfather    Heart attack Brother    Breast cancer Neg Hx     Social History   Tobacco Use   Smoking status: Former    Types: Cigarettes    Quit date: 10/16/1978    Years since quitting: 43.7   Smokeless tobacco: Never   Tobacco comments:    1963 when her father died;  smoked for a brief period of time  Vaping Use   Vaping Use: Never used  Substance Use Topics   Alcohol use: No    Alcohol/week: 0.0 standard drinks of alcohol   Drug use: No    ROS   Objective:   Vitals: BP (!) 145/69 (BP Location: Right Arm)   Pulse 71   Temp 97.7 F (36.5 C) (Oral)   Resp 16   SpO2 97%   Physical Exam Constitutional:      General: She is not in acute distress.    Appearance: Normal appearance. She is well-developed. She is not ill-appearing, toxic-appearing or diaphoretic.  HENT:     Head: Normocephalic and atraumatic.     Nose: Nose normal.     Mouth/Throat:     Mouth: Mucous membranes are moist.  Eyes:     General: No scleral icterus.       Right eye: No  discharge.        Left eye: No discharge.     Extraocular Movements: Extraocular movements intact.     Conjunctiva/sclera: Conjunctivae normal.  Cardiovascular:     Rate and Rhythm: Normal rate.  Pulmonary:     Effort: Pulmonary effort is normal.  Abdominal:     General: Bowel  sounds are normal. There is no distension.     Palpations: Abdomen is soft. There is no mass.     Tenderness: There is no abdominal tenderness. There is no right CVA tenderness, left CVA tenderness, guarding or rebound.  Skin:    General: Skin is warm and dry.  Neurological:     General: No focal deficit present.     Mental Status: She is alert and oriented to person, place, and time.  Psychiatric:        Mood and Affect: Mood normal.        Behavior: Behavior normal.        Thought Content: Thought content normal.        Judgment: Judgment normal.     Results for orders placed or performed during the hospital encounter of 07/16/22 (from the past 24 hour(s))  POCT urinalysis dipstick     Status: Abnormal   Collection Time: 07/16/22  3:00 PM  Result Value Ref Range   Color, UA yellow yellow   Clarity, UA clear clear   Glucose, UA negative negative mg/dL   Bilirubin, UA negative negative   Ketones, POC UA negative negative mg/dL   Spec Grav, UA 1.025 1.010 - 1.025   Blood, UA moderate (A) negative   pH, UA 6.0 5.0 - 8.0   Protein Ur, POC =100 (A) negative mg/dL   Urobilinogen, UA 0.2 0.2 or 1.0 E.U./dL   Nitrite, UA Negative Negative   Leukocytes, UA Negative Negative    Assessment and Plan :   PDMP not reviewed this encounter.  1. Subacute confusional state   2. Urinary urgency     Chart review shows cerebral atrophy and microvascular disease from head CT. Patient appears to be at baseline on exam, she is very lucid, follows the discussion. Ambulates without assistance. I do not suspect an acute encephalopathy. Will base further treatment off of her urine culture. Follow up closely with PCP  otherwise and maintain strict ER precautions. Counseled patient on potential for adverse effects with medications prescribed/recommended today, ER and return-to-clinic precautions discussed, patient verbalized understanding.    Jaynee Eagles, PA-C 07/17/22 1301

## 2022-07-16 NOTE — Discharge Instructions (Signed)

## 2022-07-16 NOTE — ED Triage Notes (Signed)
Was taking an antibiotic over the last week for a UTI. Sister believes the patient is still very confused and may still have a UTI.

## 2022-07-17 DIAGNOSIS — R2681 Unsteadiness on feet: Secondary | ICD-10-CM | POA: Diagnosis not present

## 2022-07-17 DIAGNOSIS — R41841 Cognitive communication deficit: Secondary | ICD-10-CM | POA: Diagnosis not present

## 2022-07-17 DIAGNOSIS — R2689 Other abnormalities of gait and mobility: Secondary | ICD-10-CM | POA: Diagnosis not present

## 2022-07-17 DIAGNOSIS — R488 Other symbolic dysfunctions: Secondary | ICD-10-CM | POA: Diagnosis not present

## 2022-07-17 DIAGNOSIS — M6281 Muscle weakness (generalized): Secondary | ICD-10-CM | POA: Diagnosis not present

## 2022-07-18 DIAGNOSIS — R41841 Cognitive communication deficit: Secondary | ICD-10-CM | POA: Diagnosis not present

## 2022-07-18 DIAGNOSIS — R2689 Other abnormalities of gait and mobility: Secondary | ICD-10-CM | POA: Diagnosis not present

## 2022-07-18 DIAGNOSIS — R488 Other symbolic dysfunctions: Secondary | ICD-10-CM | POA: Diagnosis not present

## 2022-07-18 DIAGNOSIS — R2681 Unsteadiness on feet: Secondary | ICD-10-CM | POA: Diagnosis not present

## 2022-07-18 DIAGNOSIS — M6281 Muscle weakness (generalized): Secondary | ICD-10-CM | POA: Diagnosis not present

## 2022-07-18 LAB — URINE CULTURE: Culture: NO GROWTH

## 2022-07-19 DIAGNOSIS — R41841 Cognitive communication deficit: Secondary | ICD-10-CM | POA: Diagnosis not present

## 2022-07-19 DIAGNOSIS — M6281 Muscle weakness (generalized): Secondary | ICD-10-CM | POA: Diagnosis not present

## 2022-07-19 DIAGNOSIS — R2689 Other abnormalities of gait and mobility: Secondary | ICD-10-CM | POA: Diagnosis not present

## 2022-07-19 DIAGNOSIS — R2681 Unsteadiness on feet: Secondary | ICD-10-CM | POA: Diagnosis not present

## 2022-07-19 DIAGNOSIS — R488 Other symbolic dysfunctions: Secondary | ICD-10-CM | POA: Diagnosis not present

## 2022-07-20 DIAGNOSIS — M6281 Muscle weakness (generalized): Secondary | ICD-10-CM | POA: Diagnosis not present

## 2022-07-20 DIAGNOSIS — R2681 Unsteadiness on feet: Secondary | ICD-10-CM | POA: Diagnosis not present

## 2022-07-20 DIAGNOSIS — R2689 Other abnormalities of gait and mobility: Secondary | ICD-10-CM | POA: Diagnosis not present

## 2022-07-20 DIAGNOSIS — R41841 Cognitive communication deficit: Secondary | ICD-10-CM | POA: Diagnosis not present

## 2022-07-20 DIAGNOSIS — R488 Other symbolic dysfunctions: Secondary | ICD-10-CM | POA: Diagnosis not present

## 2022-07-21 DIAGNOSIS — R2681 Unsteadiness on feet: Secondary | ICD-10-CM | POA: Diagnosis not present

## 2022-07-21 DIAGNOSIS — R488 Other symbolic dysfunctions: Secondary | ICD-10-CM | POA: Diagnosis not present

## 2022-07-21 DIAGNOSIS — R2689 Other abnormalities of gait and mobility: Secondary | ICD-10-CM | POA: Diagnosis not present

## 2022-07-21 DIAGNOSIS — M6281 Muscle weakness (generalized): Secondary | ICD-10-CM | POA: Diagnosis not present

## 2022-07-21 DIAGNOSIS — R41841 Cognitive communication deficit: Secondary | ICD-10-CM | POA: Diagnosis not present

## 2022-07-24 DIAGNOSIS — R488 Other symbolic dysfunctions: Secondary | ICD-10-CM | POA: Diagnosis not present

## 2022-07-24 DIAGNOSIS — M6281 Muscle weakness (generalized): Secondary | ICD-10-CM | POA: Diagnosis not present

## 2022-07-24 DIAGNOSIS — R2689 Other abnormalities of gait and mobility: Secondary | ICD-10-CM | POA: Diagnosis not present

## 2022-07-24 DIAGNOSIS — R41841 Cognitive communication deficit: Secondary | ICD-10-CM | POA: Diagnosis not present

## 2022-07-24 DIAGNOSIS — R2681 Unsteadiness on feet: Secondary | ICD-10-CM | POA: Diagnosis not present

## 2022-07-25 ENCOUNTER — Encounter: Payer: Self-pay | Admitting: Family Medicine

## 2022-07-25 ENCOUNTER — Ambulatory Visit (INDEPENDENT_AMBULATORY_CARE_PROVIDER_SITE_OTHER): Payer: Medicare Other | Admitting: Family Medicine

## 2022-07-25 VITALS — BP 124/68 | HR 75 | Temp 98.5°F | Ht 62.0 in | Wt 138.0 lb

## 2022-07-25 DIAGNOSIS — R41841 Cognitive communication deficit: Secondary | ICD-10-CM | POA: Diagnosis not present

## 2022-07-25 DIAGNOSIS — R350 Frequency of micturition: Secondary | ICD-10-CM

## 2022-07-25 DIAGNOSIS — M6281 Muscle weakness (generalized): Secondary | ICD-10-CM | POA: Diagnosis not present

## 2022-07-25 DIAGNOSIS — R634 Abnormal weight loss: Secondary | ICD-10-CM

## 2022-07-25 DIAGNOSIS — R2689 Other abnormalities of gait and mobility: Secondary | ICD-10-CM | POA: Diagnosis not present

## 2022-07-25 DIAGNOSIS — R488 Other symbolic dysfunctions: Secondary | ICD-10-CM | POA: Diagnosis not present

## 2022-07-25 DIAGNOSIS — I10 Essential (primary) hypertension: Secondary | ICD-10-CM

## 2022-07-25 DIAGNOSIS — R2681 Unsteadiness on feet: Secondary | ICD-10-CM | POA: Diagnosis not present

## 2022-07-25 DIAGNOSIS — F320 Major depressive disorder, single episode, mild: Secondary | ICD-10-CM

## 2022-07-25 MED ORDER — AMLODIPINE BESYLATE 2.5 MG PO TABS: 2.5000 mg | ORAL_TABLET | Freq: Every day | ORAL | 1 refills | Status: DC

## 2022-07-25 MED ORDER — MIRTAZAPINE 30 MG PO TBDP: 30.0000 mg | ORAL_TABLET | Freq: Every day | ORAL | 1 refills | Status: DC

## 2022-07-25 NOTE — Patient Instructions (Signed)
Increase to 30 mg at bedtime on the mirtazepine dose.

## 2022-07-25 NOTE — Progress Notes (Unsigned)
Established Patient Office Visit  Subjective   Patient ID: Felicia Kelly, female    DOB: 19-Feb-1936  Age: 86 y.o. MRN: [id number]  Chief Complaint  Patient presents with   Hypertension    Patient's sister states her blood pressure has been elevated for the past week (see attached readings)   Depression    Patient's sister states the patient has been crying a lot lately and grieving over the loss of her husband    Patient is here for BP check and depression symptoms. She reports that she has been having more crying spells at home. States that she is ruminating about the death of her husband which happened 12 years ago. She also has a history of dementia, no sleep disturbance. She has seen the neurologist in the past, is currently on exelon twice daily. Had adverse reaction to aricept in the past. We had started mirtazepine at the last visit due to the weight loss and decreased appetite, she is tolerating this medication well without any side effects and she has regained some of the weight that was lost previously.  HTN-- pt had lost a significant amount of weight loss and her BP medication was discontinued, states that she has regained some of the weight she has lost and her BP has been averaging in the 140's-150's. BP recheck in the office today was 138/80.     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   2019 novel coronavirus disease (COVID-19) 05/28/2022   Hypocalcemia 05/28/2022   Hypoglycemia 05/28/2022   SAH (subarachnoid hemorrhage) ([place]) 02/24/2020   Bradycardia 04/01/2019   Cystocele 02/13/2019   Hip pain 02/13/2019   Osteoarthritis of hip 02/13/2019   Subchondral cysts 02/13/2019   Vitamin D deficiency 02/13/2019   Aortic atherosclerosis ([place]) 10/14/2017   Solitary pulmonary nodule 10/23/2016   Hyperglycemia  07/11/2016   Hypercholesterolemia 06/04/2013   Hypothyroidism 06/04/2013   Osteopenia 06/27/2012   Malignant basal cell neoplasm of skin 02/14/2012   Benign essential hypertension 11/22/2011   Internal carotid artery stenosis 09/18/2011   Multinodular goiter 10/16/2009   Herpes simplex type 2 infection 10/05/2008   Single renal cyst 02/14/2008      Review of Systems  All other systems reviewed and are negative.     Objective:     BP 124/68 (BP Location: Left Arm, Patient Position: Sitting, Cuff Size: Normal)   Pulse 75   Temp 98.5 F (36.9 C) (Oral)   Ht '5\' 2"'$  (1.575 m)   Wt 138 lb (62.6 kg)   SpO2 95%   BMI 25.24 kg/m    Physical Exam Vitals reviewed.  Constitutional:      Appearance: Normal appearance. She is well-groomed and normal weight.  Eyes:     Conjunctiva/sclera: Conjunctivae normal.  Cardiovascular:     Rate and Rhythm: Regular rhythm.     Pulses: Normal pulses.     Heart sounds: S1 normal and S2 normal.  Pulmonary:     Effort: Pulmonary effort is normal.     Breath sounds: Normal breath sounds and air entry.  Musculoskeletal:        General: Normal range of motion.     Cervical back: Normal range of motion and neck supple.     Right lower leg: No edema.     Left lower leg: No edema.  Skin:    General: Skin  is warm and dry.  Neurological:     Mental Status: She is alert. Mental status is at baseline.     Gait: Gait is intact.  Psychiatric:        Mood and Affect: Mood and affect normal.        Speech: Speech normal.        Behavior: Behavior normal.        Judgment: Judgment normal.      No results found for any visits on 07/25/22.    The ASCVD Risk score (Arnett DK, et al., 2019) failed to calculate for the following reasons:   The 2019 ASCVD risk score is only valid for ages 49 to 3    Assessment & Plan:   Problem List Items Addressed This Visit       Cardiovascular and Mediastinum   Benign essential hypertension - Primary     BP today is normal, however her home readings were elevated, I reviewed the list her sister brought in with her today, about 1/2 of the readings are in the 150-160 range. I advised that we add a small dose of amlodipine 2.5 mg daily at bedtime to help. She will return t the office in 2 months for another BP check.      Relevant Medications   amLODipine (NORVASC) 2.5 MG tablet     Other   Depression, major, single episode, mild ([place])    Patient was started on mirtazepine at night for appetite and to help her sleep, these things have improved however there is still depressive symptoms present-- ruminating about her husband who passed away 12 years ago, increased crying spells and lack of motivation. We had a discussion about this and it was recommended to try increasing the mirtazepine to 30 mg at bedtime to see if this will help with the depression. She will follow up with me short term in 2 months to re-evaluate her symptoms.      Relevant Medications   mirtazapine (REMERON SOL-TAB) 30 MG disintegrating tablet   Weight loss, unintentional (Ch[place])   Relevant Medications   mirtazapine (REMERON SOL-TAB) 30 MG disintegrating tablet   Other Visit Diagnoses     Urinary frequency       Relevant Orders   Urinalysis - pt was unable to urinate in the office, will place order for lab UA.       Return in about 2 months (around 09/25/2022) for follow up on depression and HTN.    Farrel Conners, MD

## 2022-07-26 ENCOUNTER — Other Ambulatory Visit (INDEPENDENT_AMBULATORY_CARE_PROVIDER_SITE_OTHER): Payer: Medicare Other

## 2022-07-26 DIAGNOSIS — R41841 Cognitive communication deficit: Secondary | ICD-10-CM | POA: Diagnosis not present

## 2022-07-26 DIAGNOSIS — R488 Other symbolic dysfunctions: Secondary | ICD-10-CM | POA: Diagnosis not present

## 2022-07-26 DIAGNOSIS — M6281 Muscle weakness (generalized): Secondary | ICD-10-CM | POA: Diagnosis not present

## 2022-07-26 DIAGNOSIS — R2689 Other abnormalities of gait and mobility: Secondary | ICD-10-CM | POA: Diagnosis not present

## 2022-07-26 DIAGNOSIS — R350 Frequency of micturition: Secondary | ICD-10-CM | POA: Diagnosis not present

## 2022-07-26 DIAGNOSIS — R2681 Unsteadiness on feet: Secondary | ICD-10-CM | POA: Diagnosis not present

## 2022-07-26 DIAGNOSIS — F320 Major depressive disorder, single episode, mild: Secondary | ICD-10-CM | POA: Insufficient documentation

## 2022-07-26 LAB — URINALYSIS, ROUTINE W REFLEX MICROSCOPIC
Bilirubin Urine: NEGATIVE
Ketones, ur: NEGATIVE
Nitrite: NEGATIVE
Specific Gravity, Urine: 1.025 (ref 1.000–1.030)
Urine Glucose: NEGATIVE
Urobilinogen, UA: 0.2 (ref 0.0–1.0)
pH: 6 (ref 5.0–8.0)

## 2022-07-26 NOTE — Assessment & Plan Note (Signed)
BP today is normal, however her home readings were elevated, I reviewed the list her sister brought in with her today, about 1/2 of the readings are in the 150-160 range. I advised that we add a small dose of amlodipine 2.5 mg daily at bedtime to help. She will return t the office in 2 months for another BP check.

## 2022-07-26 NOTE — Assessment & Plan Note (Signed)
Patient was started on mirtazepine at night for appetite and to help her sleep, these things have improved however there is still depressive symptoms present-- ruminating about her husband who passed away 12 years ago, increased crying spells and lack of motivation. We had a discussion about this and it was recommended to try increasing the mirtazepine to 30 mg at bedtime to see if this will help with the depression. She will follow up with me short term in 2 months to re-evaluate her symptoms.

## 2022-07-27 ENCOUNTER — Other Ambulatory Visit: Payer: Medicare Other

## 2022-07-27 DIAGNOSIS — R488 Other symbolic dysfunctions: Secondary | ICD-10-CM | POA: Diagnosis not present

## 2022-07-27 DIAGNOSIS — R350 Frequency of micturition: Secondary | ICD-10-CM

## 2022-07-27 DIAGNOSIS — R2681 Unsteadiness on feet: Secondary | ICD-10-CM | POA: Diagnosis not present

## 2022-07-27 DIAGNOSIS — R41841 Cognitive communication deficit: Secondary | ICD-10-CM | POA: Diagnosis not present

## 2022-07-27 DIAGNOSIS — R2689 Other abnormalities of gait and mobility: Secondary | ICD-10-CM | POA: Diagnosis not present

## 2022-07-27 DIAGNOSIS — M6281 Muscle weakness (generalized): Secondary | ICD-10-CM | POA: Diagnosis not present

## 2022-07-27 NOTE — Progress Notes (Signed)
Please send urine for culture. Thanks!

## 2022-07-28 DIAGNOSIS — M6281 Muscle weakness (generalized): Secondary | ICD-10-CM | POA: Diagnosis not present

## 2022-07-28 DIAGNOSIS — R2689 Other abnormalities of gait and mobility: Secondary | ICD-10-CM | POA: Diagnosis not present

## 2022-07-28 DIAGNOSIS — R41841 Cognitive communication deficit: Secondary | ICD-10-CM | POA: Diagnosis not present

## 2022-07-28 DIAGNOSIS — R488 Other symbolic dysfunctions: Secondary | ICD-10-CM | POA: Diagnosis not present

## 2022-07-28 DIAGNOSIS — R2681 Unsteadiness on feet: Secondary | ICD-10-CM | POA: Diagnosis not present

## 2022-07-28 LAB — URINE CULTURE
MICRO NUMBER:: 14042871
Result:: NO GROWTH
SPECIMEN QUALITY:: ADEQUATE

## 2022-07-31 DIAGNOSIS — M6281 Muscle weakness (generalized): Secondary | ICD-10-CM | POA: Diagnosis not present

## 2022-07-31 DIAGNOSIS — R2689 Other abnormalities of gait and mobility: Secondary | ICD-10-CM | POA: Diagnosis not present

## 2022-07-31 DIAGNOSIS — R2681 Unsteadiness on feet: Secondary | ICD-10-CM | POA: Diagnosis not present

## 2022-07-31 DIAGNOSIS — R41841 Cognitive communication deficit: Secondary | ICD-10-CM | POA: Diagnosis not present

## 2022-07-31 DIAGNOSIS — R488 Other symbolic dysfunctions: Secondary | ICD-10-CM | POA: Diagnosis not present

## 2022-07-31 NOTE — Progress Notes (Signed)
Urine culture is negative. No need for treatment at this time

## 2022-08-01 DIAGNOSIS — R2689 Other abnormalities of gait and mobility: Secondary | ICD-10-CM | POA: Diagnosis not present

## 2022-08-01 DIAGNOSIS — R2681 Unsteadiness on feet: Secondary | ICD-10-CM | POA: Diagnosis not present

## 2022-08-01 DIAGNOSIS — R41841 Cognitive communication deficit: Secondary | ICD-10-CM | POA: Diagnosis not present

## 2022-08-01 DIAGNOSIS — M6281 Muscle weakness (generalized): Secondary | ICD-10-CM | POA: Diagnosis not present

## 2022-08-01 DIAGNOSIS — R488 Other symbolic dysfunctions: Secondary | ICD-10-CM | POA: Diagnosis not present

## 2022-08-02 DIAGNOSIS — M6281 Muscle weakness (generalized): Secondary | ICD-10-CM | POA: Diagnosis not present

## 2022-08-02 DIAGNOSIS — R2681 Unsteadiness on feet: Secondary | ICD-10-CM | POA: Diagnosis not present

## 2022-08-02 DIAGNOSIS — R2689 Other abnormalities of gait and mobility: Secondary | ICD-10-CM | POA: Diagnosis not present

## 2022-08-02 DIAGNOSIS — R41841 Cognitive communication deficit: Secondary | ICD-10-CM | POA: Diagnosis not present

## 2022-08-02 DIAGNOSIS — R488 Other symbolic dysfunctions: Secondary | ICD-10-CM | POA: Diagnosis not present

## 2022-08-04 DIAGNOSIS — R41841 Cognitive communication deficit: Secondary | ICD-10-CM | POA: Diagnosis not present

## 2022-08-04 DIAGNOSIS — R2689 Other abnormalities of gait and mobility: Secondary | ICD-10-CM | POA: Diagnosis not present

## 2022-08-04 DIAGNOSIS — M6281 Muscle weakness (generalized): Secondary | ICD-10-CM | POA: Diagnosis not present

## 2022-08-04 DIAGNOSIS — R488 Other symbolic dysfunctions: Secondary | ICD-10-CM | POA: Diagnosis not present

## 2022-08-04 DIAGNOSIS — R2681 Unsteadiness on feet: Secondary | ICD-10-CM | POA: Diagnosis not present

## 2022-08-07 DIAGNOSIS — R41841 Cognitive communication deficit: Secondary | ICD-10-CM | POA: Diagnosis not present

## 2022-08-07 DIAGNOSIS — R488 Other symbolic dysfunctions: Secondary | ICD-10-CM | POA: Diagnosis not present

## 2022-08-07 DIAGNOSIS — R2689 Other abnormalities of gait and mobility: Secondary | ICD-10-CM | POA: Diagnosis not present

## 2022-08-07 DIAGNOSIS — M6281 Muscle weakness (generalized): Secondary | ICD-10-CM | POA: Diagnosis not present

## 2022-08-07 DIAGNOSIS — R2681 Unsteadiness on feet: Secondary | ICD-10-CM | POA: Diagnosis not present

## 2022-08-08 ENCOUNTER — Other Ambulatory Visit: Payer: Self-pay | Admitting: *Deleted

## 2022-08-08 DIAGNOSIS — R2681 Unsteadiness on feet: Secondary | ICD-10-CM | POA: Diagnosis not present

## 2022-08-08 DIAGNOSIS — M6281 Muscle weakness (generalized): Secondary | ICD-10-CM | POA: Diagnosis not present

## 2022-08-08 DIAGNOSIS — R2689 Other abnormalities of gait and mobility: Secondary | ICD-10-CM | POA: Diagnosis not present

## 2022-08-08 DIAGNOSIS — R488 Other symbolic dysfunctions: Secondary | ICD-10-CM | POA: Diagnosis not present

## 2022-08-08 DIAGNOSIS — R41841 Cognitive communication deficit: Secondary | ICD-10-CM | POA: Diagnosis not present

## 2022-08-08 MED ORDER — METFORMIN HCL ER 500 MG PO TB24: 500.0000 mg | ORAL_TABLET | Freq: Every day | ORAL | 1 refills | Status: AC

## 2022-08-08 MED ORDER — VALACYCLOVIR HCL 500 MG PO TABS: 500.0000 mg | ORAL_TABLET | Freq: Every day | ORAL | 1 refills | Status: AC

## 2022-08-08 NOTE — Telephone Encounter (Signed)
Ok to refill 

## 2022-08-09 ENCOUNTER — Other Ambulatory Visit: Payer: Self-pay | Admitting: Family Medicine

## 2022-08-09 DIAGNOSIS — R2689 Other abnormalities of gait and mobility: Secondary | ICD-10-CM | POA: Diagnosis not present

## 2022-08-09 DIAGNOSIS — M6281 Muscle weakness (generalized): Secondary | ICD-10-CM | POA: Diagnosis not present

## 2022-08-09 DIAGNOSIS — E890 Postprocedural hypothyroidism: Secondary | ICD-10-CM

## 2022-08-09 DIAGNOSIS — R488 Other symbolic dysfunctions: Secondary | ICD-10-CM | POA: Diagnosis not present

## 2022-08-09 DIAGNOSIS — R41841 Cognitive communication deficit: Secondary | ICD-10-CM | POA: Diagnosis not present

## 2022-08-09 DIAGNOSIS — R2681 Unsteadiness on feet: Secondary | ICD-10-CM | POA: Diagnosis not present

## 2022-08-10 DIAGNOSIS — R2689 Other abnormalities of gait and mobility: Secondary | ICD-10-CM | POA: Diagnosis not present

## 2022-08-10 DIAGNOSIS — R2681 Unsteadiness on feet: Secondary | ICD-10-CM | POA: Diagnosis not present

## 2022-08-10 DIAGNOSIS — R41841 Cognitive communication deficit: Secondary | ICD-10-CM | POA: Diagnosis not present

## 2022-08-10 DIAGNOSIS — M6281 Muscle weakness (generalized): Secondary | ICD-10-CM | POA: Diagnosis not present

## 2022-08-10 DIAGNOSIS — R488 Other symbolic dysfunctions: Secondary | ICD-10-CM | POA: Diagnosis not present

## 2022-08-11 DIAGNOSIS — R2681 Unsteadiness on feet: Secondary | ICD-10-CM | POA: Diagnosis not present

## 2022-08-11 DIAGNOSIS — R488 Other symbolic dysfunctions: Secondary | ICD-10-CM | POA: Diagnosis not present

## 2022-08-11 DIAGNOSIS — R2689 Other abnormalities of gait and mobility: Secondary | ICD-10-CM | POA: Diagnosis not present

## 2022-08-11 DIAGNOSIS — R41841 Cognitive communication deficit: Secondary | ICD-10-CM | POA: Diagnosis not present

## 2022-08-11 DIAGNOSIS — M6281 Muscle weakness (generalized): Secondary | ICD-10-CM | POA: Diagnosis not present

## 2022-08-14 DIAGNOSIS — R41841 Cognitive communication deficit: Secondary | ICD-10-CM | POA: Diagnosis not present

## 2022-08-14 DIAGNOSIS — M6281 Muscle weakness (generalized): Secondary | ICD-10-CM | POA: Diagnosis not present

## 2022-08-14 DIAGNOSIS — R488 Other symbolic dysfunctions: Secondary | ICD-10-CM | POA: Diagnosis not present

## 2022-08-14 DIAGNOSIS — R2689 Other abnormalities of gait and mobility: Secondary | ICD-10-CM | POA: Diagnosis not present

## 2022-08-14 DIAGNOSIS — R2681 Unsteadiness on feet: Secondary | ICD-10-CM | POA: Diagnosis not present

## 2022-08-15 DIAGNOSIS — R2681 Unsteadiness on feet: Secondary | ICD-10-CM | POA: Diagnosis not present

## 2022-08-15 DIAGNOSIS — M6281 Muscle weakness (generalized): Secondary | ICD-10-CM | POA: Diagnosis not present

## 2022-08-15 DIAGNOSIS — R2689 Other abnormalities of gait and mobility: Secondary | ICD-10-CM | POA: Diagnosis not present

## 2022-08-15 DIAGNOSIS — R488 Other symbolic dysfunctions: Secondary | ICD-10-CM | POA: Diagnosis not present

## 2022-08-15 DIAGNOSIS — R41841 Cognitive communication deficit: Secondary | ICD-10-CM | POA: Diagnosis not present

## 2022-08-16 DIAGNOSIS — Z23 Encounter for immunization: Secondary | ICD-10-CM | POA: Diagnosis not present

## 2022-08-16 DIAGNOSIS — M6281 Muscle weakness (generalized): Secondary | ICD-10-CM | POA: Diagnosis not present

## 2022-08-16 DIAGNOSIS — R488 Other symbolic dysfunctions: Secondary | ICD-10-CM | POA: Diagnosis not present

## 2022-08-16 DIAGNOSIS — R2681 Unsteadiness on feet: Secondary | ICD-10-CM | POA: Diagnosis not present

## 2022-08-16 DIAGNOSIS — R2689 Other abnormalities of gait and mobility: Secondary | ICD-10-CM | POA: Diagnosis not present

## 2022-08-16 DIAGNOSIS — R41841 Cognitive communication deficit: Secondary | ICD-10-CM | POA: Diagnosis not present

## 2022-08-17 DIAGNOSIS — M6281 Muscle weakness (generalized): Secondary | ICD-10-CM | POA: Diagnosis not present

## 2022-08-17 DIAGNOSIS — R41841 Cognitive communication deficit: Secondary | ICD-10-CM | POA: Diagnosis not present

## 2022-08-17 DIAGNOSIS — R488 Other symbolic dysfunctions: Secondary | ICD-10-CM | POA: Diagnosis not present

## 2022-08-17 DIAGNOSIS — R2681 Unsteadiness on feet: Secondary | ICD-10-CM | POA: Diagnosis not present

## 2022-08-17 DIAGNOSIS — R2689 Other abnormalities of gait and mobility: Secondary | ICD-10-CM | POA: Diagnosis not present

## 2022-08-18 DIAGNOSIS — R2689 Other abnormalities of gait and mobility: Secondary | ICD-10-CM | POA: Diagnosis not present

## 2022-08-18 DIAGNOSIS — R2681 Unsteadiness on feet: Secondary | ICD-10-CM | POA: Diagnosis not present

## 2022-08-18 DIAGNOSIS — R488 Other symbolic dysfunctions: Secondary | ICD-10-CM | POA: Diagnosis not present

## 2022-08-18 DIAGNOSIS — R41841 Cognitive communication deficit: Secondary | ICD-10-CM | POA: Diagnosis not present

## 2022-08-18 DIAGNOSIS — M6281 Muscle weakness (generalized): Secondary | ICD-10-CM | POA: Diagnosis not present

## 2022-08-19 ENCOUNTER — Ambulatory Visit
Admission: EM | Admit: 2022-08-19 | Discharge: 2022-08-19 | Disposition: A | Discharge status: Home / Self Care | Payer: Medicare Other | Attending: Physician Assistant | Admitting: Physician Assistant

## 2022-08-19 DIAGNOSIS — R410 Disorientation, unspecified: Secondary | ICD-10-CM | POA: Diagnosis not present

## 2022-08-19 DIAGNOSIS — R82998 Other abnormal findings in urine: Secondary | ICD-10-CM | POA: Diagnosis not present

## 2022-08-19 LAB — POCT URINALYSIS DIP (MANUAL ENTRY)
Bilirubin, UA: NEGATIVE
Glucose, UA: NEGATIVE mg/dL
Ketones, POC UA: NEGATIVE mg/dL
Nitrite, UA: NEGATIVE
Spec Grav, UA: 1.02 (ref 1.010–1.025)
Urobilinogen, UA: 0.2 E.U./dL
pH, UA: 7 (ref 5.0–8.0)

## 2022-08-19 MED ORDER — CIPROFLOXACIN HCL 500 MG PO TABS: 500.0000 mg | ORAL_TABLET | Freq: Two times a day (BID) | ORAL | 0 refills | Status: DC

## 2022-08-19 NOTE — ED Triage Notes (Signed)
Pts sister states the assistant living facility called her today and states the pt has been more confused.

## 2022-08-19 NOTE — ED Provider Notes (Signed)
[facility]    CSN: [id number] Arrival date & time: 08/19/22  1141      History   Chief Complaint Chief Complaint  Patient presents with   Altered Mental Status    HPI Felicia Kelly is a 86 y.o. female.   Patient here today for eval patient of possible UTI.  Her sister brings her in after she was contacted by the patient's assisted living facility regarding increased confusion and increased combativeness over the last few days.  Sister provides history.  She reports she is not sure if confusion and combativeness is related to infection or patient's cognitive decline.  She has been evaluated for UTI several times in the recent past.  Does not report fever.  She does not complain of any other symptoms.  The history is provided by a relative.    Past Medical History:  Diagnosis Date   Aortic atherosclerosis ([place])    a. noted on CT 01/2018.   Arthritis    Colonic polyp    Coronary atherosclerosis    a. noted on CT 01/2018.   Genital herpes    Hyperlipidemia    Hypertension    Mild carotid artery disease (HCC)    Osteoporosis    Overdose of cardiac medication    Pulmonary nodules    a. followed by PCP by serial Ct.   Thyroid disease    seeing Dr. Buddy Duty   Tremor     Patient Active Problem List   Diagnosis Date Noted   Depression, major, single episode, mild ([place]) 07/26/2022   Weight loss, unintentional 06/08/2022   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   2019 novel coronavirus disease (COVID-19) 05/28/2022   Hypocalcemia 05/28/2022   Hypoglycemia 05/28/2022   SAH (subarachnoid hemorrhage) ([place]) 02/24/2020   Bradycardia 04/01/2019   Cystocele 02/13/2019   Hip pain 02/13/2019   Osteoarthritis of hip 02/13/2019   Subchondral cysts 02/13/2019   Vitamin D deficiency 02/13/2019   Aortic atherosclerosis ([place]) 10/14/2017   Solitary pulmonary nodule 10/23/2016   Hyperglycemia 07/11/2016   Hypercholesterolemia 06/04/2013   Hypothyroidism 06/04/2013    Osteopenia 06/27/2012   Malignant basal cell neoplasm of skin 02/14/2012   Benign essential hypertension 11/22/2011   Internal carotid artery stenosis 09/18/2011   Multinodular goiter 10/16/2009   Herpes simplex type 2 infection 10/05/2008   Single renal cyst 02/14/2008    Past Surgical History:  Procedure Laterality Date   ABDOMINAL HYSTERECTOMY  1988   no cancer - reports total with bilat oophorectomy   APPENDECTOMY  1988   BREAST CYST EXCISION Bilateral    BREAST SURGERY  1985   biopsy   FOOT SURGERY     multiple sugeries, remote   TOTAL HIP ARTHROPLASTY Bilateral     OB History   No obstetric history on file.      Home Medications    Prior to Admission medications   Medication Sig Start Date End Date Taking? Authorizing Provider  amLODipine (NORVASC) 2.5 MG tablet Take 1 tablet (2.5 mg total) by mouth at bedtime. 07/25/22   Farrel Conners, MD  aspirin EC 81 MG tablet Take 81 mg by mouth every evening. Swallow whole.    [provider]  Cholecalciferol (VITAMIN D-3) 25 MCG (1000 UT) CAPS Take 1,000 Units by mouth every Monday, Wednesday, and Friday.    [provider]  ciprofloxacin (CIPRO) 500 MG tablet Take 1 tablet (500 mg total) by mouth every 12 (twelve) hours. 08/19/22   Francene Finders, PA-C  cyanocobalamin  100 MCG tablet Take 100 mcg by mouth daily.    [provider]  ezetimibe (ZETIA) 10 MG tablet Take 1 tablet (10 mg total) by mouth daily. 02/14/22   [name], MD  levothyroxine (SYNTHROID) 50 MCG tablet Take 1 tablet by mouth 6 days a week, then take 2 tablets on Sundays. 30 days 08/10/22   Farrel Conners, MD  Magnesium 250 MG TABS Take 250 mg by mouth daily.    [provider]  melatonin 1 MG TABS tablet Take 1 mg by mouth at bedtime.    [provider]  METAMUCIL FIBER PO Take 1 capsule by mouth every evening.    [provider]  metFORMIN (GLUCOPHAGE-XR) 500 MG 24 hr tablet Take 1 tablet  (500 mg total) by mouth daily with breakfast. 08/08/22   Farrel Conners, MD  mirtazapine (REMERON SOL-TAB) 30 MG disintegrating tablet Take 1 tablet (30 mg total) by mouth at bedtime. Start with 1/2 tablet at bedtime for 7 days. If no adverse reactions then may increase to 1 tablet at bedtime. 07/25/22   Farrel Conners, MD  Multiple Vitamin (MULTIVITAMIN) tablet Take 1 tablet by mouth daily with breakfast.    [provider]  Multiple Vitamins-Minerals ([place]) CAPS Take 1 capsule by mouth in the morning.    [provider]  potassium chloride (KLOR-CON M) 10 MEQ tablet Take 1 tablet (10 mEq total) by mouth daily. 02/27/22   [name], MD  pravastatin (PRAVACHOL) 40 MG tablet TAKE 1 TABLET ONCE DAILY. Patient taking differently: Take 40 mg by mouth every evening. 02/16/22   [name], MD  Probiotic Product (PROBIOTIC PO) Take 1 capsule by mouth every evening.    [provider]  rivastigmine (EXELON) 4.5 MG capsule Take 1 capsule (4.5 mg total) by mouth 2 (two) times daily. 02/27/22   Rondel Jumbo, PA-C  valACYclovir (VALTREX) 500 MG tablet Take 1 tablet (500 mg total) by mouth daily. 08/08/22   Farrel Conners, MD    Family History Family History  Problem Relation Age of Onset   Pancreatic cancer Father    Heart disease Father    Heart attack Sister 89   Arthritis Mother    Hyperlipidemia Mother    Hypertension Mother    Heart disease Mother    Diabetes Mother    Liver disease Brother        cancer of bile duct   Heart disease Maternal Grandfather    Heart attack Brother    Breast cancer Neg Hx     Social History Social History   Tobacco Use   Smoking status: Former    Types: Cigarettes    Quit date: 10/16/1978    Years since quitting: 43.8   Smokeless tobacco: Never   Tobacco comments:    1962/01/03 when her father died;  smoked for a brief period of time  Vaping Use   Vaping Use: Never used  Substance  Use Topics   Alcohol use: No    Alcohol/week: 0.0 standard drinks of alcohol   Drug use: No     Allergies   Tetracycline, Atorvastatin, Bactrim [sulfamethoxazole-trimethoprim], Celecoxib, Niacin, Rosuvastatin, Simvastatin, Tetracyclines & related, and Penicillins   Review of Systems Review of Systems  Constitutional:  Negative for chills and fever.  Eyes:  Negative for discharge and redness.  Respiratory:  Negative for shortness of breath.   Gastrointestinal:  Negative for abdominal pain, nausea and vomiting.  Genitourinary:  Negative for dysuria.  Psychiatric/Behavioral:  Positive for confusion.      Physical Exam Triage Vital Signs ED Triage Vitals  Enc Vitals Group     BP      Pulse      Resp      Temp      Temp src      SpO2      Weight      Height      Head Circumference      Peak Flow      Pain Score      Pain Loc      Pain Edu?      Excl. in [place]?    No data found.  Updated Vital Signs BP 118/81 (BP Location: Left Arm)   Pulse 95   Temp 98.1 F (36.7 C) (Oral)   Resp 16   SpO2 94%   Physical Exam Vitals and nursing note reviewed.  Constitutional:      General: She is not in acute distress.    Appearance: Normal appearance. She is not ill-appearing.     Comments: Patient sitting comfortably, does not answer questions, allow sister to answer for her.  HENT:     Head: Normocephalic and atraumatic.  Eyes:     Conjunctiva/sclera: Conjunctivae normal.  Cardiovascular:     Rate and Rhythm: Normal rate.  Pulmonary:     Effort: Pulmonary effort is normal.  Neurological:     Mental Status: She is alert.  Psychiatric:        Mood and Affect: Mood normal.        Behavior: Behavior normal.        Thought Content: Thought content normal.      UC Treatments / Results  Labs (all labs ordered are listed, but only abnormal results are displayed) Labs Reviewed  POCT URINALYSIS DIP (MANUAL ENTRY) - Abnormal; Notable for the following components:       Result Value   Blood, UA small (*)    Protein Ur, POC trace (*)    Leukocytes, UA Moderate (2+) (*)    All other components within normal limits  URINE CULTURE    EKG   Radiology No results found.  Procedures Procedures (including critical care time)  Medications Ordered in UC Medications - No data to display  Initial Impression / Assessment and Plan / UC Course  I have reviewed the triage vital signs and the nursing notes.  Pertinent labs & imaging results that were available during my care of the patient were reviewed by me and considered in my medical decision making (see chart for details).    Patient does have moderate leukocytes on UA, of note she has had positive leukocyte esterase in the past with negative culture.  Will send in Cipro and culture ordered.  Recommended close follow-up with primary care provider given suspected recurrent UTIs or possible other causes of leukocytes in urine/confusion.  Sister expresses understanding.  Final Clinical Impressions(s) / UC Diagnoses   Final diagnoses:  Subacute confusional state  Leukocytes in urine   Discharge Instructions   None    ED Prescriptions     Medication Sig Dispense Auth. Provider   ciprofloxacin (CIPRO) 500 MG tablet  (Status: Discontinued) Take 1 tablet (500 mg total) by mouth every 12 (twelve) hours. 10 tablet Ewell Poe F, PA-C   ciprofloxacin (CIPRO) 500 MG tablet Take 1 tablet (500 mg total) by mouth every 12 (twelve) hours. 10 tablet Ewell Poe  F, PA-C      PDMP not reviewed this encounter.   Francene Finders, PA-C 08/19/22 1250

## 2022-08-20 LAB — URINE CULTURE

## 2022-08-21 DIAGNOSIS — R488 Other symbolic dysfunctions: Secondary | ICD-10-CM | POA: Diagnosis not present

## 2022-08-21 DIAGNOSIS — R2681 Unsteadiness on feet: Secondary | ICD-10-CM | POA: Diagnosis not present

## 2022-08-21 DIAGNOSIS — M6281 Muscle weakness (generalized): Secondary | ICD-10-CM | POA: Diagnosis not present

## 2022-08-21 DIAGNOSIS — R41841 Cognitive communication deficit: Secondary | ICD-10-CM | POA: Diagnosis not present

## 2022-08-21 DIAGNOSIS — R2689 Other abnormalities of gait and mobility: Secondary | ICD-10-CM | POA: Diagnosis not present

## 2022-08-22 DIAGNOSIS — R41841 Cognitive communication deficit: Secondary | ICD-10-CM | POA: Diagnosis not present

## 2022-08-22 DIAGNOSIS — R2681 Unsteadiness on feet: Secondary | ICD-10-CM | POA: Diagnosis not present

## 2022-08-22 DIAGNOSIS — R488 Other symbolic dysfunctions: Secondary | ICD-10-CM | POA: Diagnosis not present

## 2022-08-22 DIAGNOSIS — R2689 Other abnormalities of gait and mobility: Secondary | ICD-10-CM | POA: Diagnosis not present

## 2022-08-22 DIAGNOSIS — M6281 Muscle weakness (generalized): Secondary | ICD-10-CM | POA: Diagnosis not present

## 2022-08-23 DIAGNOSIS — R41841 Cognitive communication deficit: Secondary | ICD-10-CM | POA: Diagnosis not present

## 2022-08-23 DIAGNOSIS — M6281 Muscle weakness (generalized): Secondary | ICD-10-CM | POA: Diagnosis not present

## 2022-08-23 DIAGNOSIS — R2681 Unsteadiness on feet: Secondary | ICD-10-CM | POA: Diagnosis not present

## 2022-08-23 DIAGNOSIS — R2689 Other abnormalities of gait and mobility: Secondary | ICD-10-CM | POA: Diagnosis not present

## 2022-08-23 DIAGNOSIS — R488 Other symbolic dysfunctions: Secondary | ICD-10-CM | POA: Diagnosis not present

## 2022-08-24 DIAGNOSIS — R2681 Unsteadiness on feet: Secondary | ICD-10-CM | POA: Diagnosis not present

## 2022-08-24 DIAGNOSIS — M6281 Muscle weakness (generalized): Secondary | ICD-10-CM | POA: Diagnosis not present

## 2022-08-24 DIAGNOSIS — R488 Other symbolic dysfunctions: Secondary | ICD-10-CM | POA: Diagnosis not present

## 2022-08-24 DIAGNOSIS — R41841 Cognitive communication deficit: Secondary | ICD-10-CM | POA: Diagnosis not present

## 2022-08-24 DIAGNOSIS — R2689 Other abnormalities of gait and mobility: Secondary | ICD-10-CM | POA: Diagnosis not present

## 2022-08-25 DIAGNOSIS — R2681 Unsteadiness on feet: Secondary | ICD-10-CM | POA: Diagnosis not present

## 2022-08-25 DIAGNOSIS — R41841 Cognitive communication deficit: Secondary | ICD-10-CM | POA: Diagnosis not present

## 2022-08-25 DIAGNOSIS — R2689 Other abnormalities of gait and mobility: Secondary | ICD-10-CM | POA: Diagnosis not present

## 2022-08-25 DIAGNOSIS — R488 Other symbolic dysfunctions: Secondary | ICD-10-CM | POA: Diagnosis not present

## 2022-08-25 DIAGNOSIS — M6281 Muscle weakness (generalized): Secondary | ICD-10-CM | POA: Diagnosis not present

## 2022-08-28 DIAGNOSIS — R41841 Cognitive communication deficit: Secondary | ICD-10-CM | POA: Diagnosis not present

## 2022-08-28 DIAGNOSIS — M6281 Muscle weakness (generalized): Secondary | ICD-10-CM | POA: Diagnosis not present

## 2022-08-28 DIAGNOSIS — R2681 Unsteadiness on feet: Secondary | ICD-10-CM | POA: Diagnosis not present

## 2022-08-28 DIAGNOSIS — R488 Other symbolic dysfunctions: Secondary | ICD-10-CM | POA: Diagnosis not present

## 2022-08-28 DIAGNOSIS — R2689 Other abnormalities of gait and mobility: Secondary | ICD-10-CM | POA: Diagnosis not present

## 2022-08-29 DIAGNOSIS — R2681 Unsteadiness on feet: Secondary | ICD-10-CM | POA: Diagnosis not present

## 2022-08-29 DIAGNOSIS — R2689 Other abnormalities of gait and mobility: Secondary | ICD-10-CM | POA: Diagnosis not present

## 2022-08-29 DIAGNOSIS — M6281 Muscle weakness (generalized): Secondary | ICD-10-CM | POA: Diagnosis not present

## 2022-08-29 DIAGNOSIS — R488 Other symbolic dysfunctions: Secondary | ICD-10-CM | POA: Diagnosis not present

## 2022-08-29 DIAGNOSIS — R41841 Cognitive communication deficit: Secondary | ICD-10-CM | POA: Diagnosis not present

## 2022-08-30 DIAGNOSIS — R41841 Cognitive communication deficit: Secondary | ICD-10-CM | POA: Diagnosis not present

## 2022-08-30 DIAGNOSIS — R2689 Other abnormalities of gait and mobility: Secondary | ICD-10-CM | POA: Diagnosis not present

## 2022-08-30 DIAGNOSIS — R488 Other symbolic dysfunctions: Secondary | ICD-10-CM | POA: Diagnosis not present

## 2022-08-30 DIAGNOSIS — M6281 Muscle weakness (generalized): Secondary | ICD-10-CM | POA: Diagnosis not present

## 2022-08-30 DIAGNOSIS — R2681 Unsteadiness on feet: Secondary | ICD-10-CM | POA: Diagnosis not present

## 2022-08-31 ENCOUNTER — Ambulatory Visit: Payer: BC Managed Care – PPO | Admitting: Family Medicine

## 2022-08-31 DIAGNOSIS — R2681 Unsteadiness on feet: Secondary | ICD-10-CM | POA: Diagnosis not present

## 2022-08-31 DIAGNOSIS — M6281 Muscle weakness (generalized): Secondary | ICD-10-CM | POA: Diagnosis not present

## 2022-08-31 DIAGNOSIS — R8290 Unspecified abnormal findings in urine: Secondary | ICD-10-CM | POA: Diagnosis not present

## 2022-08-31 DIAGNOSIS — R488 Other symbolic dysfunctions: Secondary | ICD-10-CM | POA: Diagnosis not present

## 2022-08-31 DIAGNOSIS — R41841 Cognitive communication deficit: Secondary | ICD-10-CM | POA: Diagnosis not present

## 2022-08-31 DIAGNOSIS — R2689 Other abnormalities of gait and mobility: Secondary | ICD-10-CM | POA: Diagnosis not present

## 2022-09-01 DIAGNOSIS — R2681 Unsteadiness on feet: Secondary | ICD-10-CM | POA: Diagnosis not present

## 2022-09-01 DIAGNOSIS — R41841 Cognitive communication deficit: Secondary | ICD-10-CM | POA: Diagnosis not present

## 2022-09-01 DIAGNOSIS — M6281 Muscle weakness (generalized): Secondary | ICD-10-CM | POA: Diagnosis not present

## 2022-09-01 DIAGNOSIS — R2689 Other abnormalities of gait and mobility: Secondary | ICD-10-CM | POA: Diagnosis not present

## 2022-09-01 DIAGNOSIS — R488 Other symbolic dysfunctions: Secondary | ICD-10-CM | POA: Diagnosis not present

## 2022-09-03 DIAGNOSIS — R488 Other symbolic dysfunctions: Secondary | ICD-10-CM | POA: Diagnosis not present

## 2022-09-03 DIAGNOSIS — M6281 Muscle weakness (generalized): Secondary | ICD-10-CM | POA: Diagnosis not present

## 2022-09-03 DIAGNOSIS — R2681 Unsteadiness on feet: Secondary | ICD-10-CM | POA: Diagnosis not present

## 2022-09-03 DIAGNOSIS — R41841 Cognitive communication deficit: Secondary | ICD-10-CM | POA: Diagnosis not present

## 2022-09-03 DIAGNOSIS — R2689 Other abnormalities of gait and mobility: Secondary | ICD-10-CM | POA: Diagnosis not present

## 2022-09-04 ENCOUNTER — Encounter: Payer: Self-pay | Admitting: Physician Assistant

## 2022-09-04 ENCOUNTER — Ambulatory Visit (INDEPENDENT_AMBULATORY_CARE_PROVIDER_SITE_OTHER): Payer: Medicare Other | Admitting: Physician Assistant

## 2022-09-04 VITALS — BP 135/71 | HR 72 | Resp 20 | Ht 62.0 in | Wt 139.0 lb

## 2022-09-04 DIAGNOSIS — R2681 Unsteadiness on feet: Secondary | ICD-10-CM | POA: Diagnosis not present

## 2022-09-04 DIAGNOSIS — R41841 Cognitive communication deficit: Secondary | ICD-10-CM | POA: Diagnosis not present

## 2022-09-04 DIAGNOSIS — F0390 Unspecified dementia without behavioral disturbance: Secondary | ICD-10-CM | POA: Diagnosis not present

## 2022-09-04 DIAGNOSIS — I6523 Occlusion and stenosis of bilateral carotid arteries: Secondary | ICD-10-CM

## 2022-09-04 DIAGNOSIS — M6281 Muscle weakness (generalized): Secondary | ICD-10-CM | POA: Diagnosis not present

## 2022-09-04 DIAGNOSIS — R2689 Other abnormalities of gait and mobility: Secondary | ICD-10-CM | POA: Diagnosis not present

## 2022-09-04 DIAGNOSIS — R488 Other symbolic dysfunctions: Secondary | ICD-10-CM | POA: Diagnosis not present

## 2022-09-04 MED ORDER — DIVALPROEX SODIUM 125 MG PO DR TAB: 125.0000 mg | DELAYED_RELEASE_TABLET | Freq: Every day | ORAL | 3 refills | Status: DC

## 2022-09-04 NOTE — Progress Notes (Signed)
Assessment/Plan:   Dementia likely due to Alzheimer's Disease with behavioral disturbance  Felicia Kelly is a very pleasant 86 y.o. RH female seen today in follow up for memory loss. Patient is currently on rivastigmine 4.5 mg . Behavioral disturbance is worse with some aggressive behavior towards the staff. Her behavior may be related to a certain extent to her dementia      Follow up in 6  months. Continue rivastigmine 4.5 mg bid  Start Depakote 125 mg nightly, may increase to bid if needed. Side effects discussed. Patient's s Recommend good control of cardiovascular risk factors.   Agree with memory care plans for 24/7 monitoring and safety.      Subjective:    This patient is accompanied in the office by her sister who supplements the history.  Previous records as well as any outside records available were reviewed prior to todays visit. Last seen on 02/2022 at which time MMSE Was 19/30     Any changes in memory since last visit? Memory is worse according to her sister.  repeats oneself?  Endorsed Disoriented when walking into a room?  Patient denies   Leaving objects in unusual places?  Patient loses things quite often .  Ambulates  with difficulty?   Patient denies   Recent falls?  Patient denies   Any head injuries?  Patient denies   History of seizures?   Patient denies   Wandering behavior?  Patient denies   Patient drives?   Patient no longer drives  Any mood changes since last visit? Had several episode of combativeness and confusion initially believed was due to UTI, but continues to have moments of combativeness, with the caregivers, hits the staff with a cane, she is very verbal towards them and her sister. She packs her staff frequently Any worsening depression?:  Misses her husband, but it is better managed with Remeron  Hallucinations?  Patient denies   Paranoia?  Chronic paranoia about having things stolen form her. Patient reports that sleeps well  without vivid dreams, REM behavior or sleepwalking   History of sleep apnea?  Patient denies   Any hygiene concerns?  She does not like taking showers, does not wipe well Independent of bathing and dressing?  Endorsed  Does the patient needs help with medications? Sister  in charge  Who is in charge of the finances? Sister  is in charge   Any changes in appetite? Finicky, sometimes she may forget to eat. She started Remeron with good results   . Trying to drink more water.  Patient have trouble swallowing? Patient denies   Does the patient cook?  Patient denies   Any kitchen accidents such as leaving the stove on? Patient denies   Any headaches?  Patient denies   Double vision? Patient denies   Any focal numbness or tingling?  Patient denies   Chronic back pain Patient denies   Unilateral weakness?  Patient denies   Any tremors?  Minimal tremors, familial Any history of anosmia?  Patient denies   Any incontinence of urine?  Recent symptomatic UTI and in 9/203 had cystitis  Any bowel dysfunction?   Patient denies      [name]        " History of Present Illness 02/14/2019:  This is an 86 year old right-handed woman with a history of hypertension, hyperlipidemia, hypothyroidism, presenting for evaluation of worsening memory. Memory concerns were raised by family, patient has no insight into  her condition and was upset throughout most of the visit (but participated). She states "I forget things, but if you are asking if I have dementia, I don't." She thinks her memory is good. Family started noticing memory changes for several years, worsening recently where she is very forgetful, repeating things constantly. They got 10 Christmas cards from her because she did not remember sending them. Joaquim Lai reports "she has trouble admitting she has issues." She has been living alone for the past 9 years since her husband passed away. She manages finances and medications and  denies any difficulties doing these, although Joaquim Lai reports that they had to help her last March because she could not get her taxes together, she seemed to have trouble keeping up with things. She continues to drive and denies getting lost. Joaquim Lai reports she misplaces things frequently (she denies this). Joaquim Lai has noticed more irritability since they approached her about memory concerns last March, no paranoia or hallucinations. She is independent with dressing and bathing, no hygiene concerns, house is well-kept. No family history of dementia, no history of significant head injuries or alcohol use.   She denies any headaches, dizziness, diplopia, dysarthria/dysphagia, neck/back pain, focal numbness/tingling/weakness, bowel/bladder dysfunction, anosmia, or tremors. Sleep is good. She denies any falls."    PREVIOUS MEDICATIONS: Donezepil d/c 05/21/2019   PREVIOUS MEDICATIONS:   CURRENT MEDICATIONS:  Outpatient Encounter Medications as of 09/04/2022  Medication Sig   divalproex (DEPAKOTE) 125 MG DR tablet Take 1 tablet (125 mg total) by mouth daily.   amLODipine (NORVASC) 2.5 MG tablet Take 1 tablet (2.5 mg total) by mouth at bedtime.   aspirin EC 81 MG tablet Take 81 mg by mouth every evening. Swallow whole.   Cholecalciferol (VITAMIN D-3) 25 MCG (1000 UT) CAPS Take 1,000 Units by mouth every Monday, Wednesday, and Friday.   ciprofloxacin (CIPRO) 500 MG tablet Take 1 tablet (500 mg total) by mouth every 12 (twelve) hours.   cyanocobalamin 100 MCG tablet Take 100 mcg by mouth daily.   ezetimibe (ZETIA) 10 MG tablet Take 1 tablet (10 mg total) by mouth daily.   levothyroxine (SYNTHROID) 50 MCG tablet Take 1 tablet by mouth 6 days a week, then take 2 tablets on Sundays. 30 days   Magnesium 250 MG TABS Take 250 mg by mouth daily.   melatonin 1 MG TABS tablet Take 1 mg by mouth at bedtime.   METAMUCIL FIBER PO Take 1 capsule by mouth every evening.   metFORMIN (GLUCOPHAGE-XR) 500 MG 24 hr tablet  Take 1 tablet (500 mg total) by mouth daily with breakfast.   mirtazapine (REMERON SOL-TAB) 30 MG disintegrating tablet Take 1 tablet (30 mg total) by mouth at bedtime. Start with 1/2 tablet at bedtime for 7 days. If no adverse reactions then may increase to 1 tablet at bedtime.   Multiple Vitamin (MULTIVITAMIN) tablet Take 1 tablet by mouth daily with breakfast.   Multiple Vitamins-Minerals ([place] EYE HEALTH FORMULA) CAPS Take 1 capsule by mouth in the morning.   potassium chloride (KLOR-CON M) 10 MEQ tablet Take 1 tablet (10 mEq total) by mouth daily.   pravastatin (PRAVACHOL) 40 MG tablet TAKE 1 TABLET ONCE DAILY. (Patient taking differently: Take 40 mg by mouth every evening.)   Probiotic Product (PROBIOTIC PO) Take 1 capsule by mouth every evening.   rivastigmine (EXELON) 4.5 MG capsule Take 1 capsule (4.5 mg total) by mouth 2 (two) times daily.   valACYclovir (VALTREX) 500 MG tablet Take 1 tablet (500 mg total) by  mouth daily.   No facility-administered encounter medications on file as of 09/04/2022.       02/27/2022    1:00 PM 01/11/2021    2:00 PM 05/14/2018   11:41 AM  MMSE - Mini Mental State Exam  Orientation to time 0 5 5  Orientation to Place '5 5 5  '$ Registration '3 3 3  '$ Attention/ Calculation '4 5 4  '$ Recall 0 1 2  Language- name 2 objects '2 2 2  '$ Language- repeat '1 1 1  '$ Language- follow 3 step command '2 3 3  '$ Language- read & follow direction '1 1 1  '$ Write a sentence '1 1 1  '$ Copy design 0 0 1  Total score '19 27 28      '$ 02/14/2019    3:00 PM  Montreal Cognitive Assessment   Visuospatial/ Executive (0/5) 0  Naming (0/3) 0  Attention: Read list of digits (0/2) 2  Attention: Read list of letters (0/1) 0  Attention: Serial 7 subtraction starting at 100 (0/3) 2  Language: Repeat phrase (0/2) 2  Language : Fluency (0/1) 0  Abstraction (0/2) 1  Delayed Recall (0/5) 5  Orientation (0/6) 4  Total 16  Adjusted Score (based on education) 17    Objective:     PHYSICAL  EXAMINATION:    VITALS:   Vitals:   09/04/22 1427  BP: 135/71  Pulse: 72  Resp: 20  SpO2: 98%  Weight: 139 lb (63 kg)  Height: '5\' 2"'$  (1.575 m)    GEN:  The patient appears stated age and is in NAD. HEENT:  Normocephalic, atraumatic.   Neurological examination:  General: NAD, well-groomed, appears stated age. Orientation: The patient is alert. Oriented to person, not to place or date Cranial nerves: There is good facial symmetry.The speech is fluent and clear. No aphasia or dysarthria. Fund of knowledge is reduced. Recent and remote memory are impaired. Attention and concentration are reduced.  Able to name objects and repeat phrases.  Hearing is intact to conversational tone.  Delayed recall 0/3 Sensation: Sensation is intact to light touch throughout Motor: Strength is at least antigravity x4. Tremors: none  DTR's 2/4 in UE/LE     Movement examination: Tone: There is normal tone in the UE/LE Abnormal movements:  no tremor.  No myoclonus.  No asterixis.   Coordination:  There is no decremation with RAM's. Normal finger to nose  Gait and Station: The patient has no difficulty arising out of a deep-seated chair without the use of the hands. The patient's stride length is good.  Gait is cautious and narrow.    Thank you for allowing [place] the opportunity to participate in the care of this nice patient. Please do not hesitate to contact us for any questions or concerns.   Total time spent on today's visit was 25 minutes dedicated to this patient today, preparing to see patient, examining the patient, ordering tests and/or medications and counseling the patient, documenting clinical information in the EHR or other health record, independently interpreting results and communicating results to the patient/family, discussing treatment and goals, answering patient's questions and coordinating care.  Cc:  Farrel Conners, MD  Sharene Butters 09/04/2022 6:19 PM

## 2022-09-04 NOTE — Patient Instructions (Signed)
Good to see you!  1.Continue Rivastigmine to 4.5 mg twice a day  2. Continue with physical exercise, brain stimulation exercises, PT, speech therapy   control of blood pressure, cholesterol, sugar levels  Start depakote 125 mg night, can increase it to 2 times a day   3. Follow-up in 6 months, call for any changes   FALL PRECAUTIONS: Be cautious when walking. Scan the area for obstacles that may increase the risk of trips and falls. When getting up in the mornings, sit up at the edge of the bed for a few minutes before getting out of bed. Consider elevating the bed at the head end to avoid drop of blood pressure when getting up. Walk always in a well-lit room (use night lights in the walls). Avoid area rugs or power cords from appliances in the middle of the walkways. Use a walker or a cane if necessary and consider physical therapy for balance exercise. Get your eyesight checked regularly.   HOME SAFETY: Consider the safety of the kitchen when operating appliances like stoves, microwave oven, and blender. Consider having supervision and share cooking responsibilities until no longer able to participate in those. Accidents with firearms and other hazards in the house should be identified and addressed as well.   ABILITY TO BE LEFT ALONE: If patient is unable to contact 911 operator, consider using LifeLine, or when the need is there, arrange for someone to stay with patients. Smoking is a fire hazard, consider supervision or cessation. Risk of wandering should be assessed by caregiver and if detected at any point, supervision and safe proof recommendations should be instituted.  RECOMMENDATIONS FOR ALL PATIENTS WITH MEMORY PROBLEMS: 1. Continue to exercise (Recommend 30 minutes of walking everyday, or 3 hours every week) 2. Increase social interactions - continue going to [place] and enjoy social gatherings with friends and family 3. Eat healthy, avoid fried foods and eat more fruits and  vegetables 4. Maintain adequate blood pressure, blood sugar, and blood cholesterol level. Reducing the risk of stroke and cardiovascular disease also helps promoting better memory. 5. Avoid stressful situations. Live a simple life and avoid aggravations. Organize your time and prepare for the next day in anticipation. 6. Sleep well, avoid any interruptions of sleep and avoid any distractions in the bedroom that may interfere with adequate sleep quality 7. Avoid sugar, avoid sweets as there is a strong link between excessive sugar intake, diabetes, and cognitive impairment We discussed the Mediterranean diet, which has been shown to help patients reduce the risk of progressive memory disorders and reduces cardiovascular risk. This includes eating fish, eat fruits and green leafy vegetables, nuts like almonds and hazelnuts, walnuts, and also use olive oil. Avoid fast foods and fried foods as much as possible. Avoid sweets and sugar as sugar use has been linked to worsening of memory function.  There is always a concern of gradual progression of memory problems. If this is the case, then we may need to adjust level of care according to patient needs. Support, both to the patient and caregiver, should then be put into place.

## 2022-09-05 DIAGNOSIS — M6281 Muscle weakness (generalized): Secondary | ICD-10-CM | POA: Diagnosis not present

## 2022-09-05 DIAGNOSIS — R488 Other symbolic dysfunctions: Secondary | ICD-10-CM | POA: Diagnosis not present

## 2022-09-05 DIAGNOSIS — R2689 Other abnormalities of gait and mobility: Secondary | ICD-10-CM | POA: Diagnosis not present

## 2022-09-05 DIAGNOSIS — R41841 Cognitive communication deficit: Secondary | ICD-10-CM | POA: Diagnosis not present

## 2022-09-05 DIAGNOSIS — R2681 Unsteadiness on feet: Secondary | ICD-10-CM | POA: Diagnosis not present

## 2022-09-06 DIAGNOSIS — R2681 Unsteadiness on feet: Secondary | ICD-10-CM | POA: Diagnosis not present

## 2022-09-06 DIAGNOSIS — R41841 Cognitive communication deficit: Secondary | ICD-10-CM | POA: Diagnosis not present

## 2022-09-06 DIAGNOSIS — R488 Other symbolic dysfunctions: Secondary | ICD-10-CM | POA: Diagnosis not present

## 2022-09-06 DIAGNOSIS — R2689 Other abnormalities of gait and mobility: Secondary | ICD-10-CM | POA: Diagnosis not present

## 2022-09-06 DIAGNOSIS — M6281 Muscle weakness (generalized): Secondary | ICD-10-CM | POA: Diagnosis not present

## 2022-09-08 DIAGNOSIS — R488 Other symbolic dysfunctions: Secondary | ICD-10-CM | POA: Diagnosis not present

## 2022-09-08 DIAGNOSIS — M6281 Muscle weakness (generalized): Secondary | ICD-10-CM | POA: Diagnosis not present

## 2022-09-08 DIAGNOSIS — R2681 Unsteadiness on feet: Secondary | ICD-10-CM | POA: Diagnosis not present

## 2022-09-08 DIAGNOSIS — R41841 Cognitive communication deficit: Secondary | ICD-10-CM | POA: Diagnosis not present

## 2022-09-08 DIAGNOSIS — R2689 Other abnormalities of gait and mobility: Secondary | ICD-10-CM | POA: Diagnosis not present

## 2022-09-11 ENCOUNTER — Telehealth: Payer: Self-pay | Admitting: Family Medicine

## 2022-09-11 ENCOUNTER — Other Ambulatory Visit: Payer: Self-pay

## 2022-09-11 ENCOUNTER — Other Ambulatory Visit: Payer: Self-pay | Admitting: Family Medicine

## 2022-09-11 DIAGNOSIS — R2689 Other abnormalities of gait and mobility: Secondary | ICD-10-CM | POA: Diagnosis not present

## 2022-09-11 DIAGNOSIS — R488 Other symbolic dysfunctions: Secondary | ICD-10-CM | POA: Diagnosis not present

## 2022-09-11 DIAGNOSIS — M6281 Muscle weakness (generalized): Secondary | ICD-10-CM | POA: Diagnosis not present

## 2022-09-11 DIAGNOSIS — R2681 Unsteadiness on feet: Secondary | ICD-10-CM | POA: Diagnosis not present

## 2022-09-11 DIAGNOSIS — R41841 Cognitive communication deficit: Secondary | ICD-10-CM | POA: Diagnosis not present

## 2022-09-11 DIAGNOSIS — I10 Essential (primary) hypertension: Secondary | ICD-10-CM

## 2022-09-11 MED ORDER — AMLODIPINE BESYLATE 5 MG PO TABS: 5.0000 mg | ORAL_TABLET | Freq: Every day | ORAL | 0 refills | Status: DC

## 2022-09-11 NOTE — Telephone Encounter (Signed)
Yes ok to increase to 5 mg daily (2 tabs) I sent in a new rx for her for the 5 mg also. She may have this filled when she runs out of the 2,5 mg

## 2022-09-11 NOTE — Telephone Encounter (Signed)
Pt's sister called to ask MD if it would be possible to increase the amlodipine?    Pt's BP has been averaging 145 -150+ over 70.   Please advise.

## 2022-09-11 NOTE — Telephone Encounter (Signed)
Spoke with Mrs Loma Sousa and informed her of the instructions as below.

## 2022-09-12 ENCOUNTER — Other Ambulatory Visit: Payer: Self-pay

## 2022-09-12 DIAGNOSIS — M6281 Muscle weakness (generalized): Secondary | ICD-10-CM | POA: Diagnosis not present

## 2022-09-12 DIAGNOSIS — R41841 Cognitive communication deficit: Secondary | ICD-10-CM | POA: Diagnosis not present

## 2022-09-12 DIAGNOSIS — R2681 Unsteadiness on feet: Secondary | ICD-10-CM | POA: Diagnosis not present

## 2022-09-12 DIAGNOSIS — R488 Other symbolic dysfunctions: Secondary | ICD-10-CM | POA: Diagnosis not present

## 2022-09-12 DIAGNOSIS — R2689 Other abnormalities of gait and mobility: Secondary | ICD-10-CM | POA: Diagnosis not present

## 2022-09-12 NOTE — Telephone Encounter (Signed)
Has been already signed on 11/20

## 2022-09-14 ENCOUNTER — Other Ambulatory Visit: Payer: Self-pay | Admitting: Physician Assistant

## 2022-09-14 DIAGNOSIS — R488 Other symbolic dysfunctions: Secondary | ICD-10-CM | POA: Diagnosis not present

## 2022-09-14 DIAGNOSIS — M6281 Muscle weakness (generalized): Secondary | ICD-10-CM | POA: Diagnosis not present

## 2022-09-14 DIAGNOSIS — R2689 Other abnormalities of gait and mobility: Secondary | ICD-10-CM | POA: Diagnosis not present

## 2022-09-14 DIAGNOSIS — R2681 Unsteadiness on feet: Secondary | ICD-10-CM | POA: Diagnosis not present

## 2022-09-14 DIAGNOSIS — R41841 Cognitive communication deficit: Secondary | ICD-10-CM | POA: Diagnosis not present

## 2022-09-15 ENCOUNTER — Other Ambulatory Visit: Payer: Self-pay

## 2022-09-15 DIAGNOSIS — M62551 Muscle wasting and atrophy, not elsewhere classified, right thigh: Secondary | ICD-10-CM | POA: Diagnosis not present

## 2022-09-15 DIAGNOSIS — M62562 Muscle wasting and atrophy, not elsewhere classified, left lower leg: Secondary | ICD-10-CM | POA: Diagnosis not present

## 2022-09-15 DIAGNOSIS — M62552 Muscle wasting and atrophy, not elsewhere classified, left thigh: Secondary | ICD-10-CM | POA: Diagnosis not present

## 2022-09-15 DIAGNOSIS — M6281 Muscle weakness (generalized): Secondary | ICD-10-CM | POA: Diagnosis not present

## 2022-09-15 DIAGNOSIS — M62561 Muscle wasting and atrophy, not elsewhere classified, right lower leg: Secondary | ICD-10-CM | POA: Diagnosis not present

## 2022-09-15 DIAGNOSIS — R2689 Other abnormalities of gait and mobility: Secondary | ICD-10-CM | POA: Diagnosis not present

## 2022-09-15 DIAGNOSIS — R2681 Unsteadiness on feet: Secondary | ICD-10-CM | POA: Diagnosis not present

## 2022-09-15 DIAGNOSIS — R41841 Cognitive communication deficit: Secondary | ICD-10-CM | POA: Diagnosis not present

## 2022-09-15 DIAGNOSIS — R488 Other symbolic dysfunctions: Secondary | ICD-10-CM | POA: Diagnosis not present

## 2022-09-15 MED ORDER — DIVALPROEX SODIUM 125 MG PO DR TAB: 125.0000 mg | DELAYED_RELEASE_TABLET | Freq: Two times a day (BID) | ORAL | 3 refills | Status: DC

## 2022-09-18 DIAGNOSIS — R2681 Unsteadiness on feet: Secondary | ICD-10-CM | POA: Diagnosis not present

## 2022-09-18 DIAGNOSIS — R488 Other symbolic dysfunctions: Secondary | ICD-10-CM | POA: Diagnosis not present

## 2022-09-18 DIAGNOSIS — M6281 Muscle weakness (generalized): Secondary | ICD-10-CM | POA: Diagnosis not present

## 2022-09-18 DIAGNOSIS — M62551 Muscle wasting and atrophy, not elsewhere classified, right thigh: Secondary | ICD-10-CM | POA: Diagnosis not present

## 2022-09-18 DIAGNOSIS — R41841 Cognitive communication deficit: Secondary | ICD-10-CM | POA: Diagnosis not present

## 2022-09-18 DIAGNOSIS — R2689 Other abnormalities of gait and mobility: Secondary | ICD-10-CM | POA: Diagnosis not present

## 2022-09-19 ENCOUNTER — Ambulatory Visit: Payer: BC Managed Care – PPO | Admitting: Family Medicine

## 2022-09-19 DIAGNOSIS — R41841 Cognitive communication deficit: Secondary | ICD-10-CM | POA: Diagnosis not present

## 2022-09-19 DIAGNOSIS — R2689 Other abnormalities of gait and mobility: Secondary | ICD-10-CM | POA: Diagnosis not present

## 2022-09-19 DIAGNOSIS — R488 Other symbolic dysfunctions: Secondary | ICD-10-CM | POA: Diagnosis not present

## 2022-09-19 DIAGNOSIS — R2681 Unsteadiness on feet: Secondary | ICD-10-CM | POA: Diagnosis not present

## 2022-09-19 DIAGNOSIS — M6281 Muscle weakness (generalized): Secondary | ICD-10-CM | POA: Diagnosis not present

## 2022-09-19 DIAGNOSIS — M62551 Muscle wasting and atrophy, not elsewhere classified, right thigh: Secondary | ICD-10-CM | POA: Diagnosis not present

## 2022-09-20 DIAGNOSIS — R2681 Unsteadiness on feet: Secondary | ICD-10-CM | POA: Diagnosis not present

## 2022-09-20 DIAGNOSIS — R2689 Other abnormalities of gait and mobility: Secondary | ICD-10-CM | POA: Diagnosis not present

## 2022-09-20 DIAGNOSIS — M6281 Muscle weakness (generalized): Secondary | ICD-10-CM | POA: Diagnosis not present

## 2022-09-20 DIAGNOSIS — R488 Other symbolic dysfunctions: Secondary | ICD-10-CM | POA: Diagnosis not present

## 2022-09-20 DIAGNOSIS — R41841 Cognitive communication deficit: Secondary | ICD-10-CM | POA: Diagnosis not present

## 2022-09-20 DIAGNOSIS — M62551 Muscle wasting and atrophy, not elsewhere classified, right thigh: Secondary | ICD-10-CM | POA: Diagnosis not present

## 2022-09-21 DIAGNOSIS — R488 Other symbolic dysfunctions: Secondary | ICD-10-CM | POA: Diagnosis not present

## 2022-09-21 DIAGNOSIS — R41841 Cognitive communication deficit: Secondary | ICD-10-CM | POA: Diagnosis not present

## 2022-09-21 DIAGNOSIS — M6281 Muscle weakness (generalized): Secondary | ICD-10-CM | POA: Diagnosis not present

## 2022-09-21 DIAGNOSIS — R2681 Unsteadiness on feet: Secondary | ICD-10-CM | POA: Diagnosis not present

## 2022-09-21 DIAGNOSIS — R2689 Other abnormalities of gait and mobility: Secondary | ICD-10-CM | POA: Diagnosis not present

## 2022-09-21 DIAGNOSIS — M62551 Muscle wasting and atrophy, not elsewhere classified, right thigh: Secondary | ICD-10-CM | POA: Diagnosis not present

## 2022-09-22 ENCOUNTER — Ambulatory Visit (INDEPENDENT_AMBULATORY_CARE_PROVIDER_SITE_OTHER): Payer: Medicare Other | Admitting: Family Medicine

## 2022-09-22 ENCOUNTER — Encounter: Payer: Self-pay | Admitting: Family Medicine

## 2022-09-22 VITALS — BP 122/60 | HR 77 | Temp 97.5°F | Ht 62.0 in | Wt 140.8 lb

## 2022-09-22 DIAGNOSIS — I10 Essential (primary) hypertension: Secondary | ICD-10-CM | POA: Diagnosis not present

## 2022-09-22 DIAGNOSIS — R41841 Cognitive communication deficit: Secondary | ICD-10-CM | POA: Diagnosis not present

## 2022-09-22 DIAGNOSIS — R2689 Other abnormalities of gait and mobility: Secondary | ICD-10-CM | POA: Diagnosis not present

## 2022-09-22 DIAGNOSIS — R2681 Unsteadiness on feet: Secondary | ICD-10-CM | POA: Diagnosis not present

## 2022-09-22 DIAGNOSIS — F320 Major depressive disorder, single episode, mild: Secondary | ICD-10-CM

## 2022-09-22 DIAGNOSIS — M62551 Muscle wasting and atrophy, not elsewhere classified, right thigh: Secondary | ICD-10-CM | POA: Diagnosis not present

## 2022-09-22 DIAGNOSIS — M6281 Muscle weakness (generalized): Secondary | ICD-10-CM | POA: Diagnosis not present

## 2022-09-22 DIAGNOSIS — R488 Other symbolic dysfunctions: Secondary | ICD-10-CM | POA: Diagnosis not present

## 2022-09-22 NOTE — Progress Notes (Signed)
Established Patient Office Visit  Subjective   Patient ID: Felicia Kelly, female    DOB: 07/07/1936  Age: 86 y.o. MRN: [id number]  Chief Complaint  Patient presents with  . Follow-up    Patient is here for follow up.  Loss of appetite-- sister is reporting that her depression symptoms and loss of appetite have improved sine the last visit, is eating and drinking more, mood is also improved. Was also started on depakote 125 mg  twice a day for her behavior associated with her dementia.   Current Outpatient Medications  Medication Instructions  . amLODipine (NORVASC) 5 mg, Oral, Daily at bedtime  . aspirin EC 81 mg, Oral, Every evening, Swallow whole.   . cyanocobalamin 100 mcg, Oral, Daily  . divalproex (DEPAKOTE) 125 mg, Oral, 2 times daily  . ezetimibe (ZETIA) 10 mg, Oral, Daily  . levothyroxine (SYNTHROID) 50 MCG tablet Take 1 tablet by mouth 6 days a week, then take 2 tablets on Sundays. 30 days  . Magnesium 250 mg, Oral, Daily  . melatonin 1 mg, Oral, Daily at bedtime  . METAMUCIL FIBER PO 1 capsule, Oral, Every evening  . metFORMIN (GLUCOPHAGE-XR) 500 mg, Oral, Daily with breakfast  . mirtazapine (REMERON SOL-TAB) 30 mg, Oral, Daily at bedtime, Start with 1/2 tablet at bedtime for 7 days. If no adverse reactions then may increase to 1 tablet at bedtime.  . Multiple Vitamin (MULTIVITAMIN) tablet 1 tablet, Oral, Daily with breakfast  . Multiple Vitamins-Minerals (OCUVITE EYE HEALTH FORMULA) CAPS 1 capsule, Oral, Every morning  . potassium chloride (KLOR-CON M) 10 MEQ tablet 10 mEq, Oral, Daily  . pravastatin (PRAVACHOL) 40 MG tablet TAKE 1 TABLET ONCE DAILY.  . Probiotic Product (PROBIOTIC PO) 1 capsule, Oral, Every evening  . rivastigmine (EXELON) 4.5 mg, Oral, 2 times daily  . valACYclovir (VALTREX) 500 mg, Oral, Daily  . Vitamin D-3 1,000 Units, Oral, Every M-W-F    Patient Active Problem List   Diagnosis Date Noted  . Depression, major, single episode, mild ([place])  07/26/2022  . Weight loss, unintentional 06/08/2022  . 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 2019 novel coronavirus disease (COVID-19) 05/28/2022  . Hypocalcemia 05/28/2022  . Hypoglycemia 05/28/2022  . SAH (subarachnoid hemorrhage) ([place]) 02/24/2020  . Bradycardia 04/01/2019  . Cystocele 02/13/2019  . Hip pain 02/13/2019  . Osteoarthritis of hip 02/13/2019  . Subchondral cysts 02/13/2019  . Vitamin D deficiency 02/13/2019  . Aortic atherosclerosis ([place]) 10/14/2017  . Solitary pulmonary nodule 10/23/2016  . Hyperglycemia 07/11/2016  . Hypercholesterolemia 06/04/2013  . Hypothyroidism 06/04/2013  . Osteopenia 06/27/2012  . Malignant basal cell neoplasm of skin 02/14/2012  . Benign essential hypertension 11/22/2011  . Internal carotid artery stenosis 09/18/2011  . Multinodular goiter 10/16/2009  . Herpes simplex type 2 infection 10/05/2008  . Single renal cyst 02/14/2008      Review of Systems  All other systems reviewed and are negative.     Objective:     BP 122/60 (BP Location: Left Arm, Patient Position: Sitting, Cuff Size: Normal)   Pulse 77   Temp (!) 97.5 F (36.4 C) (Oral)   Ht '5\' 2"'$  (1.575 m)   Wt 140 lb 12.8 oz (63.9 kg)   SpO2 98%   BMI 25.75 kg/m  {Vitals History (Optional):23777}  Physical Exam Vitals reviewed.  Constitutional:      Appearance: Normal appearance. She is well-groomed and normal weight.  Eyes:     Conjunctiva/sclera: Conjunctivae normal.  Neck:     Thyroid:  No thyromegaly.  Cardiovascular:     Rate and Rhythm: Normal rate and regular rhythm.     Pulses: Normal pulses.     Heart sounds: S1 normal and S2 normal.  Pulmonary:     Effort: Pulmonary effort is normal.     Breath sounds: Normal breath sounds and air entry.  Abdominal:     General: Bowel sounds are normal.  Musculoskeletal:     Right lower leg: No edema.     Left lower leg: No edema.  Neurological:     Mental Status: She is alert and oriented to person, place,  and time. Mental status is at baseline.     Gait: Gait is intact.  Psychiatric:        Mood and Affect: Mood and affect normal.        Speech: Speech normal.        Behavior: Behavior normal.        Judgment: Judgment normal.     No results found for any visits on 09/22/22.  {Labs (Optional):23779}  The ASCVD Risk score (Arnett DK, et al., 2019) failed to calculate for the following reasons:   The 2019 ASCVD risk score is only valid for ages 19 to 28    Assessment & Plan:   Problem List Items Addressed This Visit   None   Return in about 6 months (around 03/24/2023) for follow up on DM.    Farrel Conners, MD

## 2022-09-25 DIAGNOSIS — R488 Other symbolic dysfunctions: Secondary | ICD-10-CM | POA: Diagnosis not present

## 2022-09-25 DIAGNOSIS — M62551 Muscle wasting and atrophy, not elsewhere classified, right thigh: Secondary | ICD-10-CM | POA: Diagnosis not present

## 2022-09-25 DIAGNOSIS — R2689 Other abnormalities of gait and mobility: Secondary | ICD-10-CM | POA: Diagnosis not present

## 2022-09-25 DIAGNOSIS — M6281 Muscle weakness (generalized): Secondary | ICD-10-CM | POA: Diagnosis not present

## 2022-09-25 DIAGNOSIS — R2681 Unsteadiness on feet: Secondary | ICD-10-CM | POA: Diagnosis not present

## 2022-09-25 DIAGNOSIS — R41841 Cognitive communication deficit: Secondary | ICD-10-CM | POA: Diagnosis not present

## 2022-09-25 NOTE — Assessment & Plan Note (Signed)
The increase in mirtazepine to 30 mg at bedtime improved her mood and her behavior, there are less crying spells and the depakote appears to be improving the aggression. Will continue her medications as prescribed.

## 2022-09-25 NOTE — Assessment & Plan Note (Signed)
BP again normal in office today, BP at home still having some elevations but the 5 mg amlodipine appears to be improving her BP. Given her advanced age and risk of falls I would permit a slightly higher BP at home. Will see her back in 6 months for follow up.

## 2022-09-26 DIAGNOSIS — R2681 Unsteadiness on feet: Secondary | ICD-10-CM | POA: Diagnosis not present

## 2022-09-26 DIAGNOSIS — M62551 Muscle wasting and atrophy, not elsewhere classified, right thigh: Secondary | ICD-10-CM | POA: Diagnosis not present

## 2022-09-26 DIAGNOSIS — M6281 Muscle weakness (generalized): Secondary | ICD-10-CM | POA: Diagnosis not present

## 2022-09-26 DIAGNOSIS — R2689 Other abnormalities of gait and mobility: Secondary | ICD-10-CM | POA: Diagnosis not present

## 2022-09-26 DIAGNOSIS — R488 Other symbolic dysfunctions: Secondary | ICD-10-CM | POA: Diagnosis not present

## 2022-09-26 DIAGNOSIS — R41841 Cognitive communication deficit: Secondary | ICD-10-CM | POA: Diagnosis not present

## 2022-09-27 DIAGNOSIS — R41841 Cognitive communication deficit: Secondary | ICD-10-CM | POA: Diagnosis not present

## 2022-09-27 DIAGNOSIS — R2681 Unsteadiness on feet: Secondary | ICD-10-CM | POA: Diagnosis not present

## 2022-09-27 DIAGNOSIS — M6281 Muscle weakness (generalized): Secondary | ICD-10-CM | POA: Diagnosis not present

## 2022-09-27 DIAGNOSIS — R488 Other symbolic dysfunctions: Secondary | ICD-10-CM | POA: Diagnosis not present

## 2022-09-27 DIAGNOSIS — R2689 Other abnormalities of gait and mobility: Secondary | ICD-10-CM | POA: Diagnosis not present

## 2022-09-27 DIAGNOSIS — M62551 Muscle wasting and atrophy, not elsewhere classified, right thigh: Secondary | ICD-10-CM | POA: Diagnosis not present

## 2022-09-28 DIAGNOSIS — M6281 Muscle weakness (generalized): Secondary | ICD-10-CM | POA: Diagnosis not present

## 2022-09-28 DIAGNOSIS — R41841 Cognitive communication deficit: Secondary | ICD-10-CM | POA: Diagnosis not present

## 2022-09-28 DIAGNOSIS — R2681 Unsteadiness on feet: Secondary | ICD-10-CM | POA: Diagnosis not present

## 2022-09-28 DIAGNOSIS — R488 Other symbolic dysfunctions: Secondary | ICD-10-CM | POA: Diagnosis not present

## 2022-09-28 DIAGNOSIS — M62551 Muscle wasting and atrophy, not elsewhere classified, right thigh: Secondary | ICD-10-CM | POA: Diagnosis not present

## 2022-09-28 DIAGNOSIS — R2689 Other abnormalities of gait and mobility: Secondary | ICD-10-CM | POA: Diagnosis not present

## 2022-09-29 DIAGNOSIS — M62551 Muscle wasting and atrophy, not elsewhere classified, right thigh: Secondary | ICD-10-CM | POA: Diagnosis not present

## 2022-09-29 DIAGNOSIS — R2689 Other abnormalities of gait and mobility: Secondary | ICD-10-CM | POA: Diagnosis not present

## 2022-09-29 DIAGNOSIS — M6281 Muscle weakness (generalized): Secondary | ICD-10-CM | POA: Diagnosis not present

## 2022-09-29 DIAGNOSIS — R2681 Unsteadiness on feet: Secondary | ICD-10-CM | POA: Diagnosis not present

## 2022-09-29 DIAGNOSIS — R41841 Cognitive communication deficit: Secondary | ICD-10-CM | POA: Diagnosis not present

## 2022-09-29 DIAGNOSIS — R488 Other symbolic dysfunctions: Secondary | ICD-10-CM | POA: Diagnosis not present

## 2022-10-02 DIAGNOSIS — R41841 Cognitive communication deficit: Secondary | ICD-10-CM | POA: Diagnosis not present

## 2022-10-02 DIAGNOSIS — R2681 Unsteadiness on feet: Secondary | ICD-10-CM | POA: Diagnosis not present

## 2022-10-02 DIAGNOSIS — R2689 Other abnormalities of gait and mobility: Secondary | ICD-10-CM | POA: Diagnosis not present

## 2022-10-02 DIAGNOSIS — R488 Other symbolic dysfunctions: Secondary | ICD-10-CM | POA: Diagnosis not present

## 2022-10-02 DIAGNOSIS — M62551 Muscle wasting and atrophy, not elsewhere classified, right thigh: Secondary | ICD-10-CM | POA: Diagnosis not present

## 2022-10-02 DIAGNOSIS — M6281 Muscle weakness (generalized): Secondary | ICD-10-CM | POA: Diagnosis not present

## 2022-10-03 DIAGNOSIS — R2689 Other abnormalities of gait and mobility: Secondary | ICD-10-CM | POA: Diagnosis not present

## 2022-10-03 DIAGNOSIS — R41841 Cognitive communication deficit: Secondary | ICD-10-CM | POA: Diagnosis not present

## 2022-10-03 DIAGNOSIS — M6281 Muscle weakness (generalized): Secondary | ICD-10-CM | POA: Diagnosis not present

## 2022-10-03 DIAGNOSIS — R2681 Unsteadiness on feet: Secondary | ICD-10-CM | POA: Diagnosis not present

## 2022-10-03 DIAGNOSIS — M62551 Muscle wasting and atrophy, not elsewhere classified, right thigh: Secondary | ICD-10-CM | POA: Diagnosis not present

## 2022-10-03 DIAGNOSIS — R488 Other symbolic dysfunctions: Secondary | ICD-10-CM | POA: Diagnosis not present

## 2022-10-04 DIAGNOSIS — R488 Other symbolic dysfunctions: Secondary | ICD-10-CM | POA: Diagnosis not present

## 2022-10-04 DIAGNOSIS — R41841 Cognitive communication deficit: Secondary | ICD-10-CM | POA: Diagnosis not present

## 2022-10-04 DIAGNOSIS — M62551 Muscle wasting and atrophy, not elsewhere classified, right thigh: Secondary | ICD-10-CM | POA: Diagnosis not present

## 2022-10-04 DIAGNOSIS — M6281 Muscle weakness (generalized): Secondary | ICD-10-CM | POA: Diagnosis not present

## 2022-10-04 DIAGNOSIS — R2681 Unsteadiness on feet: Secondary | ICD-10-CM | POA: Diagnosis not present

## 2022-10-04 DIAGNOSIS — R2689 Other abnormalities of gait and mobility: Secondary | ICD-10-CM | POA: Diagnosis not present

## 2022-10-06 DIAGNOSIS — R2681 Unsteadiness on feet: Secondary | ICD-10-CM | POA: Diagnosis not present

## 2022-10-06 DIAGNOSIS — M6281 Muscle weakness (generalized): Secondary | ICD-10-CM | POA: Diagnosis not present

## 2022-10-06 DIAGNOSIS — M62551 Muscle wasting and atrophy, not elsewhere classified, right thigh: Secondary | ICD-10-CM | POA: Diagnosis not present

## 2022-10-06 DIAGNOSIS — R41841 Cognitive communication deficit: Secondary | ICD-10-CM | POA: Diagnosis not present

## 2022-10-06 DIAGNOSIS — R488 Other symbolic dysfunctions: Secondary | ICD-10-CM | POA: Diagnosis not present

## 2022-10-06 DIAGNOSIS — R2689 Other abnormalities of gait and mobility: Secondary | ICD-10-CM | POA: Diagnosis not present

## 2022-10-10 DIAGNOSIS — R2681 Unsteadiness on feet: Secondary | ICD-10-CM | POA: Diagnosis not present

## 2022-10-10 DIAGNOSIS — R41841 Cognitive communication deficit: Secondary | ICD-10-CM | POA: Diagnosis not present

## 2022-10-10 DIAGNOSIS — M6281 Muscle weakness (generalized): Secondary | ICD-10-CM | POA: Diagnosis not present

## 2022-10-10 DIAGNOSIS — M62551 Muscle wasting and atrophy, not elsewhere classified, right thigh: Secondary | ICD-10-CM | POA: Diagnosis not present

## 2022-10-10 DIAGNOSIS — R488 Other symbolic dysfunctions: Secondary | ICD-10-CM | POA: Diagnosis not present

## 2022-10-10 DIAGNOSIS — R2689 Other abnormalities of gait and mobility: Secondary | ICD-10-CM | POA: Diagnosis not present

## 2022-10-11 ENCOUNTER — Telehealth: Payer: Self-pay | Admitting: *Deleted

## 2022-10-11 MED ORDER — POTASSIUM CHLORIDE ER 10 MEQ PO TBCR: EXTENDED_RELEASE_TABLET | ORAL | 0 refills | Status: DC

## 2022-10-11 NOTE — Telephone Encounter (Signed)
[facility] faxed a refill request for Potassium CL ER 10eq-take 1 tablet by mouth three times a week.  Message sent to PCP as the last Rx was given by cardiology.

## 2022-10-11 NOTE — Telephone Encounter (Signed)
Ok to refill 

## 2022-10-11 NOTE — Telephone Encounter (Signed)
Rx done. 

## 2022-10-13 DIAGNOSIS — R41841 Cognitive communication deficit: Secondary | ICD-10-CM | POA: Diagnosis not present

## 2022-10-13 DIAGNOSIS — R2681 Unsteadiness on feet: Secondary | ICD-10-CM | POA: Diagnosis not present

## 2022-10-13 DIAGNOSIS — M6281 Muscle weakness (generalized): Secondary | ICD-10-CM | POA: Diagnosis not present

## 2022-10-13 DIAGNOSIS — R2689 Other abnormalities of gait and mobility: Secondary | ICD-10-CM | POA: Diagnosis not present

## 2022-10-13 DIAGNOSIS — R488 Other symbolic dysfunctions: Secondary | ICD-10-CM | POA: Diagnosis not present

## 2022-10-13 DIAGNOSIS — M62551 Muscle wasting and atrophy, not elsewhere classified, right thigh: Secondary | ICD-10-CM | POA: Diagnosis not present

## 2022-10-16 DIAGNOSIS — R41841 Cognitive communication deficit: Secondary | ICD-10-CM | POA: Diagnosis not present

## 2022-10-16 DIAGNOSIS — R488 Other symbolic dysfunctions: Secondary | ICD-10-CM | POA: Diagnosis not present

## 2022-10-20 DIAGNOSIS — R488 Other symbolic dysfunctions: Secondary | ICD-10-CM | POA: Diagnosis not present

## 2022-10-20 DIAGNOSIS — R41841 Cognitive communication deficit: Secondary | ICD-10-CM | POA: Diagnosis not present

## 2022-10-24 DIAGNOSIS — R488 Other symbolic dysfunctions: Secondary | ICD-10-CM | POA: Diagnosis not present

## 2022-10-24 DIAGNOSIS — R41841 Cognitive communication deficit: Secondary | ICD-10-CM | POA: Diagnosis not present

## 2022-10-27 ENCOUNTER — Telehealth: Payer: Self-pay | Admitting: Family Medicine

## 2022-10-27 DIAGNOSIS — R488 Other symbolic dysfunctions: Secondary | ICD-10-CM | POA: Diagnosis not present

## 2022-10-27 DIAGNOSIS — R41841 Cognitive communication deficit: Secondary | ICD-10-CM | POA: Diagnosis not present

## 2022-10-27 NOTE — Telephone Encounter (Signed)
Spoke with sister Dub Mikes to schedule Medicare Annual Wellness Visit (AWV) either virtually or in office.  She stated patient is herti[facility] green and she didn't think this appt was necessary    Last AWV 05/14/18 please schedule with Nurse Health Adviser   45 min for awv-i  in office appointments 30 min for awv-s & awv-i in office/ phone/virtual appointments

## 2022-10-30 ENCOUNTER — Other Ambulatory Visit: Payer: Self-pay

## 2022-10-30 MED ORDER — EZETIMIBE 10 MG PO TABS: 10.0000 mg | ORAL_TABLET | Freq: Every day | ORAL | 1 refills | Status: DC

## 2022-11-03 DIAGNOSIS — R488 Other symbolic dysfunctions: Secondary | ICD-10-CM | POA: Diagnosis not present

## 2022-11-03 DIAGNOSIS — R41841 Cognitive communication deficit: Secondary | ICD-10-CM | POA: Diagnosis not present

## 2022-11-06 ENCOUNTER — Telehealth: Payer: Self-pay | Admitting: Family Medicine

## 2022-11-06 DIAGNOSIS — Z111 Encounter for screening for respiratory tuberculosis: Secondary | ICD-10-CM

## 2022-11-06 NOTE — Telephone Encounter (Signed)
error 

## 2022-11-06 NOTE — Telephone Encounter (Signed)
Patient needs blood test for TB  interferon-gamma release assays, moving to different area in assisted living

## 2022-11-06 NOTE — Telephone Encounter (Signed)
Ok to order under TB screening

## 2022-11-06 NOTE — Telephone Encounter (Signed)
Spoke with Mrs Loma Sousa and scheduled a lab appt on 1/23 for the patient.

## 2022-11-07 ENCOUNTER — Telehealth: Payer: Self-pay | Admitting: Family Medicine

## 2022-11-07 ENCOUNTER — Other Ambulatory Visit: Payer: Medicare Other

## 2022-11-07 ENCOUNTER — Ambulatory Visit: Payer: BC Managed Care – PPO

## 2022-11-07 ENCOUNTER — Other Ambulatory Visit: Payer: Self-pay | Admitting: Family Medicine

## 2022-11-07 DIAGNOSIS — R41841 Cognitive communication deficit: Secondary | ICD-10-CM | POA: Diagnosis not present

## 2022-11-07 DIAGNOSIS — Z111 Encounter for screening for respiratory tuberculosis: Secondary | ICD-10-CM

## 2022-11-07 DIAGNOSIS — R488 Other symbolic dysfunctions: Secondary | ICD-10-CM | POA: Diagnosis not present

## 2022-11-07 NOTE — Telephone Encounter (Signed)
[employer] and [employer] forms to be filled out--placed in dr's folder.   Fax upon completion as soon as possible.

## 2022-11-08 ENCOUNTER — Other Ambulatory Visit: Payer: Self-pay | Admitting: Family Medicine

## 2022-11-08 DIAGNOSIS — I10 Essential (primary) hypertension: Secondary | ICD-10-CM

## 2022-11-08 DIAGNOSIS — Z0279 Encounter for issue of other medical certificate: Secondary | ICD-10-CM

## 2022-11-08 NOTE — Telephone Encounter (Signed)
Danielle with [facility] is call checking on [employer] forms

## 2022-11-08 NOTE — Telephone Encounter (Signed)
Dub Mikes the [facility] and sister is calling to let dr Legrand Como know her sister can not go outside the community unattended

## 2022-11-08 NOTE — Telephone Encounter (Signed)
I left a detailed message at Mrs Reece's cell number stating the forms were completed by PCP and this was faxed to [facility] at [phone] attn: Andee Poles, along with the medication list and immunization report.  Message also left stating there is a $50 charge that will be billed to the patient.  Original placed in the medical records folder for scanning and billing.

## 2022-11-09 LAB — QUANTIFERON-TB GOLD PLUS
Mitogen-NIL: >10 IU/mL
NIL: 0.02 IU/mL
QuantiFERON-TB Gold Plus: NEGATIVE
TB1-NIL: 0.03 IU/mL
TB2-NIL: 0.02 IU/mL

## 2022-11-10 DIAGNOSIS — R488 Other symbolic dysfunctions: Secondary | ICD-10-CM | POA: Diagnosis not present

## 2022-11-10 DIAGNOSIS — R41841 Cognitive communication deficit: Secondary | ICD-10-CM | POA: Diagnosis not present

## 2022-11-10 NOTE — Telephone Encounter (Signed)
Kirsten called back to say this is an urgent matter and they need the form returned by 3pm as Pt is moving in on Monday and they would like to pick up her prescriptions as soon as possible.  She is asking the we please fax form to the number at the bottom of the page as soon as you can.

## 2022-11-10 NOTE — Telephone Encounter (Signed)
Felicia Kelly with [facility] is calling and she would like quantiferon blood work results and she will also refax the form they need additional info. Pt is sch to move into memory care unit on monday

## 2022-11-10 NOTE — Telephone Encounter (Signed)
PCP added information to the forms as below and this was faxed, along with the signed medication list and Quant results to [phone] with confirmation received at 2:56pm.  Paperwork was sent to be scanned also.

## 2022-11-10 NOTE — Telephone Encounter (Signed)
The form has been sent to [employer]

## 2022-11-14 DIAGNOSIS — R41841 Cognitive communication deficit: Secondary | ICD-10-CM | POA: Diagnosis not present

## 2022-11-14 DIAGNOSIS — R488 Other symbolic dysfunctions: Secondary | ICD-10-CM | POA: Diagnosis not present

## 2022-11-17 DIAGNOSIS — R41841 Cognitive communication deficit: Secondary | ICD-10-CM | POA: Diagnosis not present

## 2022-11-17 DIAGNOSIS — R488 Other symbolic dysfunctions: Secondary | ICD-10-CM | POA: Diagnosis not present

## 2022-11-21 DIAGNOSIS — R41841 Cognitive communication deficit: Secondary | ICD-10-CM | POA: Diagnosis not present

## 2022-11-21 DIAGNOSIS — Z9183 Wandering in diseases classified elsewhere: Secondary | ICD-10-CM | POA: Diagnosis not present

## 2022-11-21 DIAGNOSIS — F339 Major depressive disorder, recurrent, unspecified: Secondary | ICD-10-CM | POA: Diagnosis not present

## 2022-11-21 DIAGNOSIS — R488 Other symbolic dysfunctions: Secondary | ICD-10-CM | POA: Diagnosis not present

## 2022-11-21 DIAGNOSIS — F5105 Insomnia due to other mental disorder: Secondary | ICD-10-CM | POA: Diagnosis not present

## 2022-11-21 DIAGNOSIS — F0390 Unspecified dementia without behavioral disturbance: Secondary | ICD-10-CM | POA: Diagnosis not present

## 2022-11-23 DIAGNOSIS — R41841 Cognitive communication deficit: Secondary | ICD-10-CM | POA: Diagnosis not present

## 2022-11-23 DIAGNOSIS — R488 Other symbolic dysfunctions: Secondary | ICD-10-CM | POA: Diagnosis not present

## 2022-11-27 DIAGNOSIS — E559 Vitamin D deficiency, unspecified: Secondary | ICD-10-CM | POA: Diagnosis not present

## 2022-11-27 DIAGNOSIS — D519 Vitamin B12 deficiency anemia, unspecified: Secondary | ICD-10-CM | POA: Diagnosis not present

## 2022-11-27 DIAGNOSIS — F5105 Insomnia due to other mental disorder: Secondary | ICD-10-CM | POA: Diagnosis not present

## 2022-11-27 DIAGNOSIS — E119 Type 2 diabetes mellitus without complications: Secondary | ICD-10-CM | POA: Diagnosis not present

## 2022-11-27 DIAGNOSIS — E785 Hyperlipidemia, unspecified: Secondary | ICD-10-CM | POA: Diagnosis not present

## 2022-11-27 DIAGNOSIS — E039 Hypothyroidism, unspecified: Secondary | ICD-10-CM | POA: Diagnosis not present

## 2022-11-27 DIAGNOSIS — I10 Essential (primary) hypertension: Secondary | ICD-10-CM | POA: Diagnosis not present

## 2022-11-27 DIAGNOSIS — E876 Hypokalemia: Secondary | ICD-10-CM | POA: Diagnosis not present

## 2022-11-27 DIAGNOSIS — F0390 Unspecified dementia without behavioral disturbance: Secondary | ICD-10-CM | POA: Diagnosis not present

## 2022-12-01 DIAGNOSIS — R488 Other symbolic dysfunctions: Secondary | ICD-10-CM | POA: Diagnosis not present

## 2022-12-01 DIAGNOSIS — R41841 Cognitive communication deficit: Secondary | ICD-10-CM | POA: Diagnosis not present

## 2022-12-05 ENCOUNTER — Other Ambulatory Visit: Payer: Self-pay | Admitting: Family Medicine

## 2022-12-05 DIAGNOSIS — F0390 Unspecified dementia without behavioral disturbance: Secondary | ICD-10-CM | POA: Diagnosis not present

## 2022-12-05 DIAGNOSIS — E7800 Pure hypercholesterolemia, unspecified: Secondary | ICD-10-CM

## 2022-12-05 DIAGNOSIS — E119 Type 2 diabetes mellitus without complications: Secondary | ICD-10-CM | POA: Diagnosis not present

## 2022-12-05 DIAGNOSIS — W010XXD Fall on same level from slipping, tripping and stumbling without subsequent striking against object, subsequent encounter: Secondary | ICD-10-CM | POA: Diagnosis not present

## 2022-12-05 DIAGNOSIS — U071 COVID-19: Secondary | ICD-10-CM | POA: Diagnosis not present

## 2022-12-11 DIAGNOSIS — R41841 Cognitive communication deficit: Secondary | ICD-10-CM | POA: Diagnosis not present

## 2022-12-11 DIAGNOSIS — R488 Other symbolic dysfunctions: Secondary | ICD-10-CM | POA: Diagnosis not present

## 2022-12-12 DIAGNOSIS — F02818 Dementia in other diseases classified elsewhere, unspecified severity, with other behavioral disturbance: Secondary | ICD-10-CM | POA: Diagnosis not present

## 2022-12-12 DIAGNOSIS — E119 Type 2 diabetes mellitus without complications: Secondary | ICD-10-CM | POA: Diagnosis not present

## 2022-12-12 DIAGNOSIS — E782 Mixed hyperlipidemia: Secondary | ICD-10-CM | POA: Diagnosis not present

## 2022-12-12 DIAGNOSIS — G301 Alzheimer's disease with late onset: Secondary | ICD-10-CM | POA: Diagnosis not present

## 2022-12-12 DIAGNOSIS — I10 Essential (primary) hypertension: Secondary | ICD-10-CM | POA: Diagnosis not present

## 2022-12-18 DIAGNOSIS — R41841 Cognitive communication deficit: Secondary | ICD-10-CM | POA: Diagnosis not present

## 2022-12-18 DIAGNOSIS — H01001 Unspecified blepharitis right upper eyelid: Secondary | ICD-10-CM | POA: Diagnosis not present

## 2022-12-18 DIAGNOSIS — R488 Other symbolic dysfunctions: Secondary | ICD-10-CM | POA: Diagnosis not present

## 2022-12-18 DIAGNOSIS — H01004 Unspecified blepharitis left upper eyelid: Secondary | ICD-10-CM | POA: Diagnosis not present

## 2022-12-18 DIAGNOSIS — E119 Type 2 diabetes mellitus without complications: Secondary | ICD-10-CM | POA: Diagnosis not present

## 2022-12-18 DIAGNOSIS — H524 Presbyopia: Secondary | ICD-10-CM | POA: Diagnosis not present

## 2022-12-18 DIAGNOSIS — D170 Benign lipomatous neoplasm of skin and subcutaneous tissue of head, face and neck: Secondary | ICD-10-CM | POA: Diagnosis not present

## 2022-12-19 DIAGNOSIS — F331 Major depressive disorder, recurrent, moderate: Secondary | ICD-10-CM | POA: Diagnosis not present

## 2022-12-19 DIAGNOSIS — F5105 Insomnia due to other mental disorder: Secondary | ICD-10-CM | POA: Diagnosis not present

## 2022-12-19 DIAGNOSIS — G301 Alzheimer's disease with late onset: Secondary | ICD-10-CM | POA: Diagnosis not present

## 2022-12-19 DIAGNOSIS — F02818 Dementia in other diseases classified elsewhere, unspecified severity, with other behavioral disturbance: Secondary | ICD-10-CM | POA: Diagnosis not present

## 2022-12-20 ENCOUNTER — Telehealth: Payer: Self-pay | Admitting: *Deleted

## 2022-12-20 DIAGNOSIS — M8580 Other specified disorders of bone density and structure, unspecified site: Secondary | ICD-10-CM

## 2022-12-20 DIAGNOSIS — I10 Essential (primary) hypertension: Secondary | ICD-10-CM

## 2022-12-20 DIAGNOSIS — H6123 Impacted cerumen, bilateral: Secondary | ICD-10-CM | POA: Diagnosis not present

## 2022-12-20 NOTE — Telephone Encounter (Signed)
Pharmacy referral placed as requested.

## 2022-12-21 DIAGNOSIS — R488 Other symbolic dysfunctions: Secondary | ICD-10-CM | POA: Diagnosis not present

## 2022-12-21 DIAGNOSIS — R41841 Cognitive communication deficit: Secondary | ICD-10-CM | POA: Diagnosis not present

## 2022-12-25 DIAGNOSIS — F331 Major depressive disorder, recurrent, moderate: Secondary | ICD-10-CM | POA: Diagnosis not present

## 2022-12-25 DIAGNOSIS — F419 Anxiety disorder, unspecified: Secondary | ICD-10-CM | POA: Diagnosis not present

## 2022-12-25 DIAGNOSIS — R41841 Cognitive communication deficit: Secondary | ICD-10-CM | POA: Diagnosis not present

## 2022-12-25 DIAGNOSIS — R488 Other symbolic dysfunctions: Secondary | ICD-10-CM | POA: Diagnosis not present

## 2022-12-27 ENCOUNTER — Other Ambulatory Visit: Payer: Self-pay | Admitting: Family Medicine

## 2022-12-27 DIAGNOSIS — Z1231 Encounter for screening mammogram for malignant neoplasm of breast: Secondary | ICD-10-CM

## 2022-12-28 DIAGNOSIS — R488 Other symbolic dysfunctions: Secondary | ICD-10-CM | POA: Diagnosis not present

## 2022-12-28 DIAGNOSIS — R41841 Cognitive communication deficit: Secondary | ICD-10-CM | POA: Diagnosis not present

## 2023-01-01 DIAGNOSIS — E876 Hypokalemia: Secondary | ICD-10-CM | POA: Diagnosis not present

## 2023-01-01 DIAGNOSIS — E559 Vitamin D deficiency, unspecified: Secondary | ICD-10-CM | POA: Diagnosis not present

## 2023-01-01 DIAGNOSIS — R41841 Cognitive communication deficit: Secondary | ICD-10-CM | POA: Diagnosis not present

## 2023-01-01 DIAGNOSIS — E039 Hypothyroidism, unspecified: Secondary | ICD-10-CM | POA: Diagnosis not present

## 2023-01-01 DIAGNOSIS — F5105 Insomnia due to other mental disorder: Secondary | ICD-10-CM | POA: Diagnosis not present

## 2023-01-01 DIAGNOSIS — F02818 Dementia in other diseases classified elsewhere, unspecified severity, with other behavioral disturbance: Secondary | ICD-10-CM | POA: Diagnosis not present

## 2023-01-01 DIAGNOSIS — I10 Essential (primary) hypertension: Secondary | ICD-10-CM | POA: Diagnosis not present

## 2023-01-01 DIAGNOSIS — R488 Other symbolic dysfunctions: Secondary | ICD-10-CM | POA: Diagnosis not present

## 2023-01-01 DIAGNOSIS — D519 Vitamin B12 deficiency anemia, unspecified: Secondary | ICD-10-CM | POA: Diagnosis not present

## 2023-01-01 DIAGNOSIS — E782 Mixed hyperlipidemia: Secondary | ICD-10-CM | POA: Diagnosis not present

## 2023-01-01 DIAGNOSIS — E119 Type 2 diabetes mellitus without complications: Secondary | ICD-10-CM | POA: Diagnosis not present

## 2023-01-03 DIAGNOSIS — R488 Other symbolic dysfunctions: Secondary | ICD-10-CM | POA: Diagnosis not present

## 2023-01-03 DIAGNOSIS — R41841 Cognitive communication deficit: Secondary | ICD-10-CM | POA: Diagnosis not present

## 2023-01-09 DIAGNOSIS — R41841 Cognitive communication deficit: Secondary | ICD-10-CM | POA: Diagnosis not present

## 2023-01-09 DIAGNOSIS — R488 Other symbolic dysfunctions: Secondary | ICD-10-CM | POA: Diagnosis not present

## 2023-01-16 DIAGNOSIS — F419 Anxiety disorder, unspecified: Secondary | ICD-10-CM | POA: Diagnosis not present

## 2023-01-16 DIAGNOSIS — F03B4 Unspecified dementia, moderate, with anxiety: Secondary | ICD-10-CM | POA: Diagnosis not present

## 2023-01-16 DIAGNOSIS — F339 Major depressive disorder, recurrent, unspecified: Secondary | ICD-10-CM | POA: Diagnosis not present

## 2023-01-16 DIAGNOSIS — G4700 Insomnia, unspecified: Secondary | ICD-10-CM | POA: Diagnosis not present

## 2023-01-22 ENCOUNTER — Telehealth: Payer: Self-pay | Admitting: Family Medicine

## 2023-01-22 DIAGNOSIS — E039 Hypothyroidism, unspecified: Secondary | ICD-10-CM | POA: Diagnosis not present

## 2023-01-22 DIAGNOSIS — E119 Type 2 diabetes mellitus without complications: Secondary | ICD-10-CM | POA: Diagnosis not present

## 2023-01-22 DIAGNOSIS — E559 Vitamin D deficiency, unspecified: Secondary | ICD-10-CM | POA: Diagnosis not present

## 2023-01-22 DIAGNOSIS — E876 Hypokalemia: Secondary | ICD-10-CM | POA: Diagnosis not present

## 2023-01-22 DIAGNOSIS — E782 Mixed hyperlipidemia: Secondary | ICD-10-CM | POA: Diagnosis not present

## 2023-01-22 DIAGNOSIS — I10 Essential (primary) hypertension: Secondary | ICD-10-CM | POA: Diagnosis not present

## 2023-01-22 DIAGNOSIS — D519 Vitamin B12 deficiency anemia, unspecified: Secondary | ICD-10-CM | POA: Diagnosis not present

## 2023-01-22 NOTE — Telephone Encounter (Signed)
Called pt, left msg to sch AWV 

## 2023-01-22 NOTE — Telephone Encounter (Signed)
Sister would like to know if it is time to check Pt's thyroids?  Pt is now at [facility] / Memory Care.  Please advise.

## 2023-01-23 NOTE — Telephone Encounter (Signed)
We can check it now if she wants for we can wait until her appointment in June, only 2 months away -- she will be due for all of her labs then

## 2023-01-23 NOTE — Telephone Encounter (Signed)
Spoke with Felicia Kelly and informed her of the message below.  She agreed to await the patient's appt in June for labs.

## 2023-01-24 DIAGNOSIS — M62552 Muscle wasting and atrophy, not elsewhere classified, left thigh: Secondary | ICD-10-CM | POA: Diagnosis not present

## 2023-01-24 DIAGNOSIS — R41841 Cognitive communication deficit: Secondary | ICD-10-CM | POA: Diagnosis not present

## 2023-01-24 DIAGNOSIS — R4789 Other speech disturbances: Secondary | ICD-10-CM | POA: Diagnosis not present

## 2023-01-24 DIAGNOSIS — R488 Other symbolic dysfunctions: Secondary | ICD-10-CM | POA: Diagnosis not present

## 2023-01-24 DIAGNOSIS — M62561 Muscle wasting and atrophy, not elsewhere classified, right lower leg: Secondary | ICD-10-CM | POA: Diagnosis not present

## 2023-01-24 DIAGNOSIS — M62511 Muscle wasting and atrophy, not elsewhere classified, right shoulder: Secondary | ICD-10-CM | POA: Diagnosis not present

## 2023-01-24 DIAGNOSIS — M62562 Muscle wasting and atrophy, not elsewhere classified, left lower leg: Secondary | ICD-10-CM | POA: Diagnosis not present

## 2023-01-24 DIAGNOSIS — M62551 Muscle wasting and atrophy, not elsewhere classified, right thigh: Secondary | ICD-10-CM | POA: Diagnosis not present

## 2023-01-24 DIAGNOSIS — R2689 Other abnormalities of gait and mobility: Secondary | ICD-10-CM | POA: Diagnosis not present

## 2023-01-24 DIAGNOSIS — M62512 Muscle wasting and atrophy, not elsewhere classified, left shoulder: Secondary | ICD-10-CM | POA: Diagnosis not present

## 2023-01-25 DIAGNOSIS — R488 Other symbolic dysfunctions: Secondary | ICD-10-CM | POA: Diagnosis not present

## 2023-01-25 DIAGNOSIS — M62511 Muscle wasting and atrophy, not elsewhere classified, right shoulder: Secondary | ICD-10-CM | POA: Diagnosis not present

## 2023-01-25 DIAGNOSIS — M62551 Muscle wasting and atrophy, not elsewhere classified, right thigh: Secondary | ICD-10-CM | POA: Diagnosis not present

## 2023-01-25 DIAGNOSIS — R2689 Other abnormalities of gait and mobility: Secondary | ICD-10-CM | POA: Diagnosis not present

## 2023-01-25 DIAGNOSIS — R41841 Cognitive communication deficit: Secondary | ICD-10-CM | POA: Diagnosis not present

## 2023-01-25 DIAGNOSIS — R4789 Other speech disturbances: Secondary | ICD-10-CM | POA: Diagnosis not present

## 2023-01-26 DIAGNOSIS — M62551 Muscle wasting and atrophy, not elsewhere classified, right thigh: Secondary | ICD-10-CM | POA: Diagnosis not present

## 2023-01-26 DIAGNOSIS — R2689 Other abnormalities of gait and mobility: Secondary | ICD-10-CM | POA: Diagnosis not present

## 2023-01-26 DIAGNOSIS — R488 Other symbolic dysfunctions: Secondary | ICD-10-CM | POA: Diagnosis not present

## 2023-01-26 DIAGNOSIS — R4789 Other speech disturbances: Secondary | ICD-10-CM | POA: Diagnosis not present

## 2023-01-26 DIAGNOSIS — M62511 Muscle wasting and atrophy, not elsewhere classified, right shoulder: Secondary | ICD-10-CM | POA: Diagnosis not present

## 2023-01-26 DIAGNOSIS — R41841 Cognitive communication deficit: Secondary | ICD-10-CM | POA: Diagnosis not present

## 2023-01-29 ENCOUNTER — Ambulatory Visit (HOSPITAL_COMMUNITY)
Admission: RE | Admit: 2023-01-29 | Discharge: 2023-01-29 | Disposition: A | Discharge status: Home / Self Care | Payer: Medicare Other | Source: Ambulatory Visit | Attending: Cardiology | Admitting: Cardiology

## 2023-01-29 DIAGNOSIS — M62551 Muscle wasting and atrophy, not elsewhere classified, right thigh: Secondary | ICD-10-CM | POA: Diagnosis not present

## 2023-01-29 DIAGNOSIS — I6523 Occlusion and stenosis of bilateral carotid arteries: Secondary | ICD-10-CM | POA: Diagnosis not present

## 2023-01-29 DIAGNOSIS — R2689 Other abnormalities of gait and mobility: Secondary | ICD-10-CM | POA: Diagnosis not present

## 2023-01-29 DIAGNOSIS — M62511 Muscle wasting and atrophy, not elsewhere classified, right shoulder: Secondary | ICD-10-CM | POA: Diagnosis not present

## 2023-01-29 DIAGNOSIS — R4789 Other speech disturbances: Secondary | ICD-10-CM | POA: Diagnosis not present

## 2023-01-29 DIAGNOSIS — R488 Other symbolic dysfunctions: Secondary | ICD-10-CM | POA: Diagnosis not present

## 2023-01-29 DIAGNOSIS — R41841 Cognitive communication deficit: Secondary | ICD-10-CM | POA: Diagnosis not present

## 2023-01-30 DIAGNOSIS — M62551 Muscle wasting and atrophy, not elsewhere classified, right thigh: Secondary | ICD-10-CM | POA: Diagnosis not present

## 2023-01-30 DIAGNOSIS — F419 Anxiety disorder, unspecified: Secondary | ICD-10-CM | POA: Diagnosis not present

## 2023-01-30 DIAGNOSIS — F5105 Insomnia due to other mental disorder: Secondary | ICD-10-CM | POA: Diagnosis not present

## 2023-01-30 DIAGNOSIS — F0390 Unspecified dementia without behavioral disturbance: Secondary | ICD-10-CM | POA: Diagnosis not present

## 2023-01-30 DIAGNOSIS — R41841 Cognitive communication deficit: Secondary | ICD-10-CM | POA: Diagnosis not present

## 2023-01-30 DIAGNOSIS — R488 Other symbolic dysfunctions: Secondary | ICD-10-CM | POA: Diagnosis not present

## 2023-01-30 DIAGNOSIS — M62511 Muscle wasting and atrophy, not elsewhere classified, right shoulder: Secondary | ICD-10-CM | POA: Diagnosis not present

## 2023-01-30 DIAGNOSIS — F331 Major depressive disorder, recurrent, moderate: Secondary | ICD-10-CM | POA: Diagnosis not present

## 2023-01-30 DIAGNOSIS — R2689 Other abnormalities of gait and mobility: Secondary | ICD-10-CM | POA: Diagnosis not present

## 2023-01-30 DIAGNOSIS — R4789 Other speech disturbances: Secondary | ICD-10-CM | POA: Diagnosis not present

## 2023-01-31 ENCOUNTER — Telehealth: Payer: Self-pay | Admitting: Cardiology

## 2023-01-31 DIAGNOSIS — R41841 Cognitive communication deficit: Secondary | ICD-10-CM | POA: Diagnosis not present

## 2023-01-31 DIAGNOSIS — R4789 Other speech disturbances: Secondary | ICD-10-CM | POA: Diagnosis not present

## 2023-01-31 DIAGNOSIS — M62511 Muscle wasting and atrophy, not elsewhere classified, right shoulder: Secondary | ICD-10-CM | POA: Diagnosis not present

## 2023-01-31 DIAGNOSIS — R2689 Other abnormalities of gait and mobility: Secondary | ICD-10-CM | POA: Diagnosis not present

## 2023-01-31 DIAGNOSIS — M62551 Muscle wasting and atrophy, not elsewhere classified, right thigh: Secondary | ICD-10-CM | POA: Diagnosis not present

## 2023-01-31 DIAGNOSIS — R488 Other symbolic dysfunctions: Secondary | ICD-10-CM | POA: Diagnosis not present

## 2023-01-31 NOTE — Telephone Encounter (Signed)
Patient's sister [place] returned your call.

## 2023-01-31 NOTE — Telephone Encounter (Signed)
-----   Message from Meriam Sprague, MD sent at 01/30/2023  8:19 PM EDT ----- Her carotid ultrasound shows very mild narrowing on the left side and no significant disease on the right. This is good news. Will continue with monitoring every 2-3 years.

## 2023-01-31 NOTE — Telephone Encounter (Signed)
The patients sister [name] (on [place]) has been notified of the result and verbalized understanding.  All questions (if any) were answered. Loa Socks, LPN 1/61/0960 45:40 PM

## 2023-02-01 DIAGNOSIS — R4789 Other speech disturbances: Secondary | ICD-10-CM | POA: Diagnosis not present

## 2023-02-01 DIAGNOSIS — R488 Other symbolic dysfunctions: Secondary | ICD-10-CM | POA: Diagnosis not present

## 2023-02-01 DIAGNOSIS — R41841 Cognitive communication deficit: Secondary | ICD-10-CM | POA: Diagnosis not present

## 2023-02-01 DIAGNOSIS — R2689 Other abnormalities of gait and mobility: Secondary | ICD-10-CM | POA: Diagnosis not present

## 2023-02-01 DIAGNOSIS — M62511 Muscle wasting and atrophy, not elsewhere classified, right shoulder: Secondary | ICD-10-CM | POA: Diagnosis not present

## 2023-02-01 DIAGNOSIS — M62551 Muscle wasting and atrophy, not elsewhere classified, right thigh: Secondary | ICD-10-CM | POA: Diagnosis not present

## 2023-02-02 DIAGNOSIS — M62551 Muscle wasting and atrophy, not elsewhere classified, right thigh: Secondary | ICD-10-CM | POA: Diagnosis not present

## 2023-02-02 DIAGNOSIS — R41841 Cognitive communication deficit: Secondary | ICD-10-CM | POA: Diagnosis not present

## 2023-02-02 DIAGNOSIS — I10 Essential (primary) hypertension: Secondary | ICD-10-CM | POA: Diagnosis not present

## 2023-02-02 DIAGNOSIS — R488 Other symbolic dysfunctions: Secondary | ICD-10-CM | POA: Diagnosis not present

## 2023-02-02 DIAGNOSIS — D519 Vitamin B12 deficiency anemia, unspecified: Secondary | ICD-10-CM | POA: Diagnosis not present

## 2023-02-02 DIAGNOSIS — E119 Type 2 diabetes mellitus without complications: Secondary | ICD-10-CM | POA: Diagnosis not present

## 2023-02-02 DIAGNOSIS — R2689 Other abnormalities of gait and mobility: Secondary | ICD-10-CM | POA: Diagnosis not present

## 2023-02-02 DIAGNOSIS — R4789 Other speech disturbances: Secondary | ICD-10-CM | POA: Diagnosis not present

## 2023-02-02 DIAGNOSIS — E039 Hypothyroidism, unspecified: Secondary | ICD-10-CM | POA: Diagnosis not present

## 2023-02-02 DIAGNOSIS — M62511 Muscle wasting and atrophy, not elsewhere classified, right shoulder: Secondary | ICD-10-CM | POA: Diagnosis not present

## 2023-02-02 DIAGNOSIS — E782 Mixed hyperlipidemia: Secondary | ICD-10-CM | POA: Diagnosis not present

## 2023-02-02 DIAGNOSIS — E559 Vitamin D deficiency, unspecified: Secondary | ICD-10-CM | POA: Diagnosis not present

## 2023-02-05 DIAGNOSIS — R4789 Other speech disturbances: Secondary | ICD-10-CM | POA: Diagnosis not present

## 2023-02-05 DIAGNOSIS — M62511 Muscle wasting and atrophy, not elsewhere classified, right shoulder: Secondary | ICD-10-CM | POA: Diagnosis not present

## 2023-02-05 DIAGNOSIS — R2689 Other abnormalities of gait and mobility: Secondary | ICD-10-CM | POA: Diagnosis not present

## 2023-02-05 DIAGNOSIS — E782 Mixed hyperlipidemia: Secondary | ICD-10-CM | POA: Diagnosis not present

## 2023-02-05 DIAGNOSIS — M62551 Muscle wasting and atrophy, not elsewhere classified, right thigh: Secondary | ICD-10-CM | POA: Diagnosis not present

## 2023-02-05 DIAGNOSIS — R41841 Cognitive communication deficit: Secondary | ICD-10-CM | POA: Diagnosis not present

## 2023-02-05 DIAGNOSIS — E876 Hypokalemia: Secondary | ICD-10-CM | POA: Diagnosis not present

## 2023-02-05 DIAGNOSIS — E559 Vitamin D deficiency, unspecified: Secondary | ICD-10-CM | POA: Diagnosis not present

## 2023-02-05 DIAGNOSIS — R488 Other symbolic dysfunctions: Secondary | ICD-10-CM | POA: Diagnosis not present

## 2023-02-05 DIAGNOSIS — I10 Essential (primary) hypertension: Secondary | ICD-10-CM | POA: Diagnosis not present

## 2023-02-05 DIAGNOSIS — E039 Hypothyroidism, unspecified: Secondary | ICD-10-CM | POA: Diagnosis not present

## 2023-02-05 DIAGNOSIS — E119 Type 2 diabetes mellitus without complications: Secondary | ICD-10-CM | POA: Diagnosis not present

## 2023-02-06 DIAGNOSIS — M62511 Muscle wasting and atrophy, not elsewhere classified, right shoulder: Secondary | ICD-10-CM | POA: Diagnosis not present

## 2023-02-06 DIAGNOSIS — R4789 Other speech disturbances: Secondary | ICD-10-CM | POA: Diagnosis not present

## 2023-02-06 DIAGNOSIS — R41841 Cognitive communication deficit: Secondary | ICD-10-CM | POA: Diagnosis not present

## 2023-02-06 DIAGNOSIS — R2689 Other abnormalities of gait and mobility: Secondary | ICD-10-CM | POA: Diagnosis not present

## 2023-02-06 DIAGNOSIS — M62551 Muscle wasting and atrophy, not elsewhere classified, right thigh: Secondary | ICD-10-CM | POA: Diagnosis not present

## 2023-02-06 DIAGNOSIS — R488 Other symbolic dysfunctions: Secondary | ICD-10-CM | POA: Diagnosis not present

## 2023-02-07 ENCOUNTER — Ambulatory Visit (INDEPENDENT_AMBULATORY_CARE_PROVIDER_SITE_OTHER): Payer: Medicare Other | Admitting: Podiatry

## 2023-02-07 DIAGNOSIS — R2689 Other abnormalities of gait and mobility: Secondary | ICD-10-CM | POA: Diagnosis not present

## 2023-02-07 DIAGNOSIS — R488 Other symbolic dysfunctions: Secondary | ICD-10-CM | POA: Diagnosis not present

## 2023-02-07 DIAGNOSIS — R41841 Cognitive communication deficit: Secondary | ICD-10-CM | POA: Diagnosis not present

## 2023-02-07 DIAGNOSIS — M62511 Muscle wasting and atrophy, not elsewhere classified, right shoulder: Secondary | ICD-10-CM | POA: Diagnosis not present

## 2023-02-07 DIAGNOSIS — M62551 Muscle wasting and atrophy, not elsewhere classified, right thigh: Secondary | ICD-10-CM | POA: Diagnosis not present

## 2023-02-07 DIAGNOSIS — L989 Disorder of the skin and subcutaneous tissue, unspecified: Secondary | ICD-10-CM

## 2023-02-07 DIAGNOSIS — R4789 Other speech disturbances: Secondary | ICD-10-CM | POA: Diagnosis not present

## 2023-02-07 NOTE — Progress Notes (Signed)
Subjective: 87 y.o. female presenting to the office today for follow-up evaluation of symptomatic calluses to the bilateral feet.  Patient states that they are very painful to walk on.  She does have a history of dementia and is somewhat of a poor historian however her sister states that she walks with an antalgic gait and there is significant pain during walking despite wearing good shoes.  They deny history of injury.  They have not done anything for treatment.  She presents for further treatment and evaluation  Past Medical History:  Diagnosis Date   Aortic atherosclerosis (HCC)    a. noted on CT 01/2018.   Arthritis    Colonic polyp    Coronary atherosclerosis    a. noted on CT 01/2018.   Genital herpes    Hyperlipidemia    Hypertension    Mild carotid artery disease (HCC)    Osteoporosis    Overdose of cardiac medication    Pulmonary nodules    a. followed by PCP by serial Ct.   Thyroid disease    seeing Dr. Sharl Ma   Tremor    Past Surgical History:  Procedure Laterality Date   ABDOMINAL HYSTERECTOMY  1988   no cancer - reports total with bilat oophorectomy   APPENDECTOMY  1988   BREAST CYST EXCISION Bilateral    BREAST SURGERY  1985   biopsy   FOOT SURGERY     multiple sugeries, remote   TOTAL HIP ARTHROPLASTY Bilateral    Allergies  Allergen Reactions   Tetracycline Itching   Atorvastatin Other (See Comments)    Leg cramps   Bactrim [Sulfamethoxazole-Trimethoprim] Itching   Celecoxib Other (See Comments)    Memory disturbance, slowed thinking    Niacin Other (See Comments) and Hypertension    Caused elevated BP   Rosuvastatin Other (See Comments)    Elevated CPK   Simvastatin Other (See Comments)    Leg cramps    Tetracyclines & Related Itching   Penicillins Swelling, Rash and Other (See Comments)    ALL -CILLINS, pt's sister states "swelling" reported is related to the rash she gets.  Patient tolerates cephalosporins.     Objective:  Physical  Exam General: Alert and oriented x3 in no acute distress  Dermatology: Hyperkeratotic lesion(s) present on the weightbearing surfaces of the bilateral feet. Pain on palpation with a central nucleated core noted. Skin is warm, dry and supple bilateral lower extremities. Negative for open lesions or macerations.  Vascular: Palpable pedal pulses bilaterally. No edema or erythema noted. Capillary refill within normal limits.  Neurological: Epicritic and protective threshold grossly intact bilaterally.   Musculoskeletal Exam: Pain on palpation at the keratotic lesion(s) noted.  Palpation of the forefoot also elicits pain.  Atrophy of the plantar fat pad also noted bilateral feet otherwise no pedal deformity noted  Assessment: 1.  Symptomatic calluses bilateral feet 2.  Fat pad atrophy   Plan of Care:  1. Patient evaluated 2. Excisional debridement of keratoic lesion(s) using a chisel blade was performed without incident.  3.  Recommend good supportive shoes and advised against going barefoot  4. Patient is to return to the clinic PRN.   Felicia Kelly, sister, is Felicia Kelly's mother-in-law  Felicia Kelly, DPM [facility]  Dr. Felecia Kelly, DPM    [address]  [place], [place] [address]                Office [phone]  Fax [phone]

## 2023-02-08 DIAGNOSIS — R41841 Cognitive communication deficit: Secondary | ICD-10-CM | POA: Diagnosis not present

## 2023-02-08 DIAGNOSIS — R2689 Other abnormalities of gait and mobility: Secondary | ICD-10-CM | POA: Diagnosis not present

## 2023-02-08 DIAGNOSIS — M62551 Muscle wasting and atrophy, not elsewhere classified, right thigh: Secondary | ICD-10-CM | POA: Diagnosis not present

## 2023-02-08 DIAGNOSIS — R4789 Other speech disturbances: Secondary | ICD-10-CM | POA: Diagnosis not present

## 2023-02-08 DIAGNOSIS — M62511 Muscle wasting and atrophy, not elsewhere classified, right shoulder: Secondary | ICD-10-CM | POA: Diagnosis not present

## 2023-02-08 DIAGNOSIS — R488 Other symbolic dysfunctions: Secondary | ICD-10-CM | POA: Diagnosis not present

## 2023-02-09 DIAGNOSIS — R488 Other symbolic dysfunctions: Secondary | ICD-10-CM | POA: Diagnosis not present

## 2023-02-09 DIAGNOSIS — R4789 Other speech disturbances: Secondary | ICD-10-CM | POA: Diagnosis not present

## 2023-02-09 DIAGNOSIS — R2689 Other abnormalities of gait and mobility: Secondary | ICD-10-CM | POA: Diagnosis not present

## 2023-02-09 DIAGNOSIS — R41841 Cognitive communication deficit: Secondary | ICD-10-CM | POA: Diagnosis not present

## 2023-02-09 DIAGNOSIS — M62511 Muscle wasting and atrophy, not elsewhere classified, right shoulder: Secondary | ICD-10-CM | POA: Diagnosis not present

## 2023-02-09 DIAGNOSIS — M62551 Muscle wasting and atrophy, not elsewhere classified, right thigh: Secondary | ICD-10-CM | POA: Diagnosis not present

## 2023-02-12 ENCOUNTER — Ambulatory Visit
Admission: RE | Admit: 2023-02-12 | Discharge: 2023-02-12 | Disposition: A | Discharge status: Home / Self Care | Payer: Medicare Other | Source: Ambulatory Visit | Attending: Family Medicine

## 2023-02-12 DIAGNOSIS — M62511 Muscle wasting and atrophy, not elsewhere classified, right shoulder: Secondary | ICD-10-CM | POA: Diagnosis not present

## 2023-02-12 DIAGNOSIS — R41841 Cognitive communication deficit: Secondary | ICD-10-CM | POA: Diagnosis not present

## 2023-02-12 DIAGNOSIS — F411 Generalized anxiety disorder: Secondary | ICD-10-CM | POA: Diagnosis not present

## 2023-02-12 DIAGNOSIS — M62551 Muscle wasting and atrophy, not elsewhere classified, right thigh: Secondary | ICD-10-CM | POA: Diagnosis not present

## 2023-02-12 DIAGNOSIS — Z1231 Encounter for screening mammogram for malignant neoplasm of breast: Secondary | ICD-10-CM | POA: Diagnosis not present

## 2023-02-12 DIAGNOSIS — R488 Other symbolic dysfunctions: Secondary | ICD-10-CM | POA: Diagnosis not present

## 2023-02-12 DIAGNOSIS — R2689 Other abnormalities of gait and mobility: Secondary | ICD-10-CM | POA: Diagnosis not present

## 2023-02-12 DIAGNOSIS — R4789 Other speech disturbances: Secondary | ICD-10-CM | POA: Diagnosis not present

## 2023-02-12 DIAGNOSIS — F331 Major depressive disorder, recurrent, moderate: Secondary | ICD-10-CM | POA: Diagnosis not present

## 2023-02-13 DIAGNOSIS — F0390 Unspecified dementia without behavioral disturbance: Secondary | ICD-10-CM | POA: Diagnosis not present

## 2023-02-13 DIAGNOSIS — F419 Anxiety disorder, unspecified: Secondary | ICD-10-CM | POA: Diagnosis not present

## 2023-02-13 DIAGNOSIS — F331 Major depressive disorder, recurrent, moderate: Secondary | ICD-10-CM | POA: Diagnosis not present

## 2023-02-13 DIAGNOSIS — F5105 Insomnia due to other mental disorder: Secondary | ICD-10-CM | POA: Diagnosis not present

## 2023-02-14 DIAGNOSIS — M62512 Muscle wasting and atrophy, not elsewhere classified, left shoulder: Secondary | ICD-10-CM | POA: Diagnosis not present

## 2023-02-14 DIAGNOSIS — M62511 Muscle wasting and atrophy, not elsewhere classified, right shoulder: Secondary | ICD-10-CM | POA: Diagnosis not present

## 2023-02-14 DIAGNOSIS — M62562 Muscle wasting and atrophy, not elsewhere classified, left lower leg: Secondary | ICD-10-CM | POA: Diagnosis not present

## 2023-02-14 DIAGNOSIS — R2689 Other abnormalities of gait and mobility: Secondary | ICD-10-CM | POA: Diagnosis not present

## 2023-02-14 DIAGNOSIS — R4789 Other speech disturbances: Secondary | ICD-10-CM | POA: Diagnosis not present

## 2023-02-14 DIAGNOSIS — M62552 Muscle wasting and atrophy, not elsewhere classified, left thigh: Secondary | ICD-10-CM | POA: Diagnosis not present

## 2023-02-14 DIAGNOSIS — M62551 Muscle wasting and atrophy, not elsewhere classified, right thigh: Secondary | ICD-10-CM | POA: Diagnosis not present

## 2023-02-14 DIAGNOSIS — R41841 Cognitive communication deficit: Secondary | ICD-10-CM | POA: Diagnosis not present

## 2023-02-14 DIAGNOSIS — R488 Other symbolic dysfunctions: Secondary | ICD-10-CM | POA: Diagnosis not present

## 2023-02-14 DIAGNOSIS — M62561 Muscle wasting and atrophy, not elsewhere classified, right lower leg: Secondary | ICD-10-CM | POA: Diagnosis not present

## 2023-02-15 DIAGNOSIS — M62551 Muscle wasting and atrophy, not elsewhere classified, right thigh: Secondary | ICD-10-CM | POA: Diagnosis not present

## 2023-02-15 DIAGNOSIS — R41841 Cognitive communication deficit: Secondary | ICD-10-CM | POA: Diagnosis not present

## 2023-02-15 DIAGNOSIS — R2689 Other abnormalities of gait and mobility: Secondary | ICD-10-CM | POA: Diagnosis not present

## 2023-02-15 DIAGNOSIS — M62511 Muscle wasting and atrophy, not elsewhere classified, right shoulder: Secondary | ICD-10-CM | POA: Diagnosis not present

## 2023-02-15 DIAGNOSIS — R488 Other symbolic dysfunctions: Secondary | ICD-10-CM | POA: Diagnosis not present

## 2023-02-15 DIAGNOSIS — R4789 Other speech disturbances: Secondary | ICD-10-CM | POA: Diagnosis not present

## 2023-02-16 DIAGNOSIS — R4789 Other speech disturbances: Secondary | ICD-10-CM | POA: Diagnosis not present

## 2023-02-16 DIAGNOSIS — M62551 Muscle wasting and atrophy, not elsewhere classified, right thigh: Secondary | ICD-10-CM | POA: Diagnosis not present

## 2023-02-16 DIAGNOSIS — M62511 Muscle wasting and atrophy, not elsewhere classified, right shoulder: Secondary | ICD-10-CM | POA: Diagnosis not present

## 2023-02-16 DIAGNOSIS — R41841 Cognitive communication deficit: Secondary | ICD-10-CM | POA: Diagnosis not present

## 2023-02-16 DIAGNOSIS — R2689 Other abnormalities of gait and mobility: Secondary | ICD-10-CM | POA: Diagnosis not present

## 2023-02-16 DIAGNOSIS — R488 Other symbolic dysfunctions: Secondary | ICD-10-CM | POA: Diagnosis not present

## 2023-02-17 DIAGNOSIS — R4789 Other speech disturbances: Secondary | ICD-10-CM | POA: Diagnosis not present

## 2023-02-17 DIAGNOSIS — M62511 Muscle wasting and atrophy, not elsewhere classified, right shoulder: Secondary | ICD-10-CM | POA: Diagnosis not present

## 2023-02-17 DIAGNOSIS — R41841 Cognitive communication deficit: Secondary | ICD-10-CM | POA: Diagnosis not present

## 2023-02-17 DIAGNOSIS — R2689 Other abnormalities of gait and mobility: Secondary | ICD-10-CM | POA: Diagnosis not present

## 2023-02-17 DIAGNOSIS — R488 Other symbolic dysfunctions: Secondary | ICD-10-CM | POA: Diagnosis not present

## 2023-02-17 DIAGNOSIS — M62551 Muscle wasting and atrophy, not elsewhere classified, right thigh: Secondary | ICD-10-CM | POA: Diagnosis not present

## 2023-02-18 DIAGNOSIS — R488 Other symbolic dysfunctions: Secondary | ICD-10-CM | POA: Diagnosis not present

## 2023-02-18 DIAGNOSIS — R41841 Cognitive communication deficit: Secondary | ICD-10-CM | POA: Diagnosis not present

## 2023-02-18 DIAGNOSIS — M62511 Muscle wasting and atrophy, not elsewhere classified, right shoulder: Secondary | ICD-10-CM | POA: Diagnosis not present

## 2023-02-18 DIAGNOSIS — R4789 Other speech disturbances: Secondary | ICD-10-CM | POA: Diagnosis not present

## 2023-02-18 DIAGNOSIS — M62551 Muscle wasting and atrophy, not elsewhere classified, right thigh: Secondary | ICD-10-CM | POA: Diagnosis not present

## 2023-02-18 DIAGNOSIS — R2689 Other abnormalities of gait and mobility: Secondary | ICD-10-CM | POA: Diagnosis not present

## 2023-02-19 DIAGNOSIS — I10 Essential (primary) hypertension: Secondary | ICD-10-CM | POA: Diagnosis not present

## 2023-02-19 DIAGNOSIS — G4700 Insomnia, unspecified: Secondary | ICD-10-CM | POA: Diagnosis not present

## 2023-02-19 DIAGNOSIS — D519 Vitamin B12 deficiency anemia, unspecified: Secondary | ICD-10-CM | POA: Diagnosis not present

## 2023-02-19 DIAGNOSIS — R488 Other symbolic dysfunctions: Secondary | ICD-10-CM | POA: Diagnosis not present

## 2023-02-19 DIAGNOSIS — E039 Hypothyroidism, unspecified: Secondary | ICD-10-CM | POA: Diagnosis not present

## 2023-02-19 DIAGNOSIS — R2689 Other abnormalities of gait and mobility: Secondary | ICD-10-CM | POA: Diagnosis not present

## 2023-02-19 DIAGNOSIS — M62551 Muscle wasting and atrophy, not elsewhere classified, right thigh: Secondary | ICD-10-CM | POA: Diagnosis not present

## 2023-02-19 DIAGNOSIS — E559 Vitamin D deficiency, unspecified: Secondary | ICD-10-CM | POA: Diagnosis not present

## 2023-02-19 DIAGNOSIS — E782 Mixed hyperlipidemia: Secondary | ICD-10-CM | POA: Diagnosis not present

## 2023-02-19 DIAGNOSIS — F03B4 Unspecified dementia, moderate, with anxiety: Secondary | ICD-10-CM | POA: Diagnosis not present

## 2023-02-19 DIAGNOSIS — E119 Type 2 diabetes mellitus without complications: Secondary | ICD-10-CM | POA: Diagnosis not present

## 2023-02-19 DIAGNOSIS — M62511 Muscle wasting and atrophy, not elsewhere classified, right shoulder: Secondary | ICD-10-CM | POA: Diagnosis not present

## 2023-02-19 DIAGNOSIS — E876 Hypokalemia: Secondary | ICD-10-CM | POA: Diagnosis not present

## 2023-02-19 DIAGNOSIS — R4789 Other speech disturbances: Secondary | ICD-10-CM | POA: Diagnosis not present

## 2023-02-19 DIAGNOSIS — R41841 Cognitive communication deficit: Secondary | ICD-10-CM | POA: Diagnosis not present

## 2023-02-20 DIAGNOSIS — R4789 Other speech disturbances: Secondary | ICD-10-CM | POA: Diagnosis not present

## 2023-02-20 DIAGNOSIS — R488 Other symbolic dysfunctions: Secondary | ICD-10-CM | POA: Diagnosis not present

## 2023-02-20 DIAGNOSIS — M62551 Muscle wasting and atrophy, not elsewhere classified, right thigh: Secondary | ICD-10-CM | POA: Diagnosis not present

## 2023-02-20 DIAGNOSIS — M62511 Muscle wasting and atrophy, not elsewhere classified, right shoulder: Secondary | ICD-10-CM | POA: Diagnosis not present

## 2023-02-20 DIAGNOSIS — R2689 Other abnormalities of gait and mobility: Secondary | ICD-10-CM | POA: Diagnosis not present

## 2023-02-20 DIAGNOSIS — R41841 Cognitive communication deficit: Secondary | ICD-10-CM | POA: Diagnosis not present

## 2023-02-21 DIAGNOSIS — R2689 Other abnormalities of gait and mobility: Secondary | ICD-10-CM | POA: Diagnosis not present

## 2023-02-21 DIAGNOSIS — M62551 Muscle wasting and atrophy, not elsewhere classified, right thigh: Secondary | ICD-10-CM | POA: Diagnosis not present

## 2023-02-21 DIAGNOSIS — R488 Other symbolic dysfunctions: Secondary | ICD-10-CM | POA: Diagnosis not present

## 2023-02-21 DIAGNOSIS — R4789 Other speech disturbances: Secondary | ICD-10-CM | POA: Diagnosis not present

## 2023-02-21 DIAGNOSIS — R41841 Cognitive communication deficit: Secondary | ICD-10-CM | POA: Diagnosis not present

## 2023-02-21 DIAGNOSIS — M62511 Muscle wasting and atrophy, not elsewhere classified, right shoulder: Secondary | ICD-10-CM | POA: Diagnosis not present

## 2023-02-22 DIAGNOSIS — R4789 Other speech disturbances: Secondary | ICD-10-CM | POA: Diagnosis not present

## 2023-02-22 DIAGNOSIS — R488 Other symbolic dysfunctions: Secondary | ICD-10-CM | POA: Diagnosis not present

## 2023-02-22 DIAGNOSIS — M62551 Muscle wasting and atrophy, not elsewhere classified, right thigh: Secondary | ICD-10-CM | POA: Diagnosis not present

## 2023-02-22 DIAGNOSIS — R2689 Other abnormalities of gait and mobility: Secondary | ICD-10-CM | POA: Diagnosis not present

## 2023-02-22 DIAGNOSIS — M62511 Muscle wasting and atrophy, not elsewhere classified, right shoulder: Secondary | ICD-10-CM | POA: Diagnosis not present

## 2023-02-22 DIAGNOSIS — R41841 Cognitive communication deficit: Secondary | ICD-10-CM | POA: Diagnosis not present

## 2023-02-23 DIAGNOSIS — R41841 Cognitive communication deficit: Secondary | ICD-10-CM | POA: Diagnosis not present

## 2023-02-23 DIAGNOSIS — R488 Other symbolic dysfunctions: Secondary | ICD-10-CM | POA: Diagnosis not present

## 2023-02-23 DIAGNOSIS — R2689 Other abnormalities of gait and mobility: Secondary | ICD-10-CM | POA: Diagnosis not present

## 2023-02-23 DIAGNOSIS — M62551 Muscle wasting and atrophy, not elsewhere classified, right thigh: Secondary | ICD-10-CM | POA: Diagnosis not present

## 2023-02-23 DIAGNOSIS — M62511 Muscle wasting and atrophy, not elsewhere classified, right shoulder: Secondary | ICD-10-CM | POA: Diagnosis not present

## 2023-02-23 DIAGNOSIS — R4789 Other speech disturbances: Secondary | ICD-10-CM | POA: Diagnosis not present

## 2023-02-26 DIAGNOSIS — M62551 Muscle wasting and atrophy, not elsewhere classified, right thigh: Secondary | ICD-10-CM | POA: Diagnosis not present

## 2023-02-26 DIAGNOSIS — R488 Other symbolic dysfunctions: Secondary | ICD-10-CM | POA: Diagnosis not present

## 2023-02-26 DIAGNOSIS — R41841 Cognitive communication deficit: Secondary | ICD-10-CM | POA: Diagnosis not present

## 2023-02-26 DIAGNOSIS — R2689 Other abnormalities of gait and mobility: Secondary | ICD-10-CM | POA: Diagnosis not present

## 2023-02-26 DIAGNOSIS — R4789 Other speech disturbances: Secondary | ICD-10-CM | POA: Diagnosis not present

## 2023-02-26 DIAGNOSIS — M62511 Muscle wasting and atrophy, not elsewhere classified, right shoulder: Secondary | ICD-10-CM | POA: Diagnosis not present

## 2023-02-27 DIAGNOSIS — M62551 Muscle wasting and atrophy, not elsewhere classified, right thigh: Secondary | ICD-10-CM | POA: Diagnosis not present

## 2023-02-27 DIAGNOSIS — R4789 Other speech disturbances: Secondary | ICD-10-CM | POA: Diagnosis not present

## 2023-02-27 DIAGNOSIS — M62511 Muscle wasting and atrophy, not elsewhere classified, right shoulder: Secondary | ICD-10-CM | POA: Diagnosis not present

## 2023-02-27 DIAGNOSIS — R2689 Other abnormalities of gait and mobility: Secondary | ICD-10-CM | POA: Diagnosis not present

## 2023-02-27 DIAGNOSIS — R488 Other symbolic dysfunctions: Secondary | ICD-10-CM | POA: Diagnosis not present

## 2023-02-27 DIAGNOSIS — R41841 Cognitive communication deficit: Secondary | ICD-10-CM | POA: Diagnosis not present

## 2023-02-28 DIAGNOSIS — R41841 Cognitive communication deficit: Secondary | ICD-10-CM | POA: Diagnosis not present

## 2023-02-28 DIAGNOSIS — R488 Other symbolic dysfunctions: Secondary | ICD-10-CM | POA: Diagnosis not present

## 2023-02-28 DIAGNOSIS — M62511 Muscle wasting and atrophy, not elsewhere classified, right shoulder: Secondary | ICD-10-CM | POA: Diagnosis not present

## 2023-02-28 DIAGNOSIS — M62551 Muscle wasting and atrophy, not elsewhere classified, right thigh: Secondary | ICD-10-CM | POA: Diagnosis not present

## 2023-02-28 DIAGNOSIS — R2689 Other abnormalities of gait and mobility: Secondary | ICD-10-CM | POA: Diagnosis not present

## 2023-02-28 DIAGNOSIS — R4789 Other speech disturbances: Secondary | ICD-10-CM | POA: Diagnosis not present

## 2023-03-01 DIAGNOSIS — R488 Other symbolic dysfunctions: Secondary | ICD-10-CM | POA: Diagnosis not present

## 2023-03-01 DIAGNOSIS — R4789 Other speech disturbances: Secondary | ICD-10-CM | POA: Diagnosis not present

## 2023-03-01 DIAGNOSIS — R41841 Cognitive communication deficit: Secondary | ICD-10-CM | POA: Diagnosis not present

## 2023-03-01 DIAGNOSIS — Z1283 Encounter for screening for malignant neoplasm of skin: Secondary | ICD-10-CM | POA: Diagnosis not present

## 2023-03-01 DIAGNOSIS — D225 Melanocytic nevi of trunk: Secondary | ICD-10-CM | POA: Diagnosis not present

## 2023-03-01 DIAGNOSIS — M62511 Muscle wasting and atrophy, not elsewhere classified, right shoulder: Secondary | ICD-10-CM | POA: Diagnosis not present

## 2023-03-01 DIAGNOSIS — R2689 Other abnormalities of gait and mobility: Secondary | ICD-10-CM | POA: Diagnosis not present

## 2023-03-01 DIAGNOSIS — M62551 Muscle wasting and atrophy, not elsewhere classified, right thigh: Secondary | ICD-10-CM | POA: Diagnosis not present

## 2023-03-02 DIAGNOSIS — R2689 Other abnormalities of gait and mobility: Secondary | ICD-10-CM | POA: Diagnosis not present

## 2023-03-02 DIAGNOSIS — R488 Other symbolic dysfunctions: Secondary | ICD-10-CM | POA: Diagnosis not present

## 2023-03-02 DIAGNOSIS — R4789 Other speech disturbances: Secondary | ICD-10-CM | POA: Diagnosis not present

## 2023-03-02 DIAGNOSIS — R41841 Cognitive communication deficit: Secondary | ICD-10-CM | POA: Diagnosis not present

## 2023-03-02 DIAGNOSIS — M62511 Muscle wasting and atrophy, not elsewhere classified, right shoulder: Secondary | ICD-10-CM | POA: Diagnosis not present

## 2023-03-02 DIAGNOSIS — M62551 Muscle wasting and atrophy, not elsewhere classified, right thigh: Secondary | ICD-10-CM | POA: Diagnosis not present

## 2023-03-05 DIAGNOSIS — M62511 Muscle wasting and atrophy, not elsewhere classified, right shoulder: Secondary | ICD-10-CM | POA: Diagnosis not present

## 2023-03-05 DIAGNOSIS — R41841 Cognitive communication deficit: Secondary | ICD-10-CM | POA: Diagnosis not present

## 2023-03-05 DIAGNOSIS — M62551 Muscle wasting and atrophy, not elsewhere classified, right thigh: Secondary | ICD-10-CM | POA: Diagnosis not present

## 2023-03-05 DIAGNOSIS — R2689 Other abnormalities of gait and mobility: Secondary | ICD-10-CM | POA: Diagnosis not present

## 2023-03-05 DIAGNOSIS — R4789 Other speech disturbances: Secondary | ICD-10-CM | POA: Diagnosis not present

## 2023-03-05 DIAGNOSIS — R488 Other symbolic dysfunctions: Secondary | ICD-10-CM | POA: Diagnosis not present

## 2023-03-06 DIAGNOSIS — M62551 Muscle wasting and atrophy, not elsewhere classified, right thigh: Secondary | ICD-10-CM | POA: Diagnosis not present

## 2023-03-06 DIAGNOSIS — R2689 Other abnormalities of gait and mobility: Secondary | ICD-10-CM | POA: Diagnosis not present

## 2023-03-06 DIAGNOSIS — R488 Other symbolic dysfunctions: Secondary | ICD-10-CM | POA: Diagnosis not present

## 2023-03-06 DIAGNOSIS — M62511 Muscle wasting and atrophy, not elsewhere classified, right shoulder: Secondary | ICD-10-CM | POA: Diagnosis not present

## 2023-03-06 DIAGNOSIS — R4789 Other speech disturbances: Secondary | ICD-10-CM | POA: Diagnosis not present

## 2023-03-06 DIAGNOSIS — R41841 Cognitive communication deficit: Secondary | ICD-10-CM | POA: Diagnosis not present

## 2023-03-07 DIAGNOSIS — M62551 Muscle wasting and atrophy, not elsewhere classified, right thigh: Secondary | ICD-10-CM | POA: Diagnosis not present

## 2023-03-07 DIAGNOSIS — M62511 Muscle wasting and atrophy, not elsewhere classified, right shoulder: Secondary | ICD-10-CM | POA: Diagnosis not present

## 2023-03-07 DIAGNOSIS — R4789 Other speech disturbances: Secondary | ICD-10-CM | POA: Diagnosis not present

## 2023-03-07 DIAGNOSIS — R488 Other symbolic dysfunctions: Secondary | ICD-10-CM | POA: Diagnosis not present

## 2023-03-07 DIAGNOSIS — R41841 Cognitive communication deficit: Secondary | ICD-10-CM | POA: Diagnosis not present

## 2023-03-07 DIAGNOSIS — R2689 Other abnormalities of gait and mobility: Secondary | ICD-10-CM | POA: Diagnosis not present

## 2023-03-08 DIAGNOSIS — R2689 Other abnormalities of gait and mobility: Secondary | ICD-10-CM | POA: Diagnosis not present

## 2023-03-08 DIAGNOSIS — R4789 Other speech disturbances: Secondary | ICD-10-CM | POA: Diagnosis not present

## 2023-03-08 DIAGNOSIS — R41841 Cognitive communication deficit: Secondary | ICD-10-CM | POA: Diagnosis not present

## 2023-03-08 DIAGNOSIS — M62551 Muscle wasting and atrophy, not elsewhere classified, right thigh: Secondary | ICD-10-CM | POA: Diagnosis not present

## 2023-03-08 DIAGNOSIS — M62511 Muscle wasting and atrophy, not elsewhere classified, right shoulder: Secondary | ICD-10-CM | POA: Diagnosis not present

## 2023-03-08 DIAGNOSIS — R488 Other symbolic dysfunctions: Secondary | ICD-10-CM | POA: Diagnosis not present

## 2023-03-09 DIAGNOSIS — R2689 Other abnormalities of gait and mobility: Secondary | ICD-10-CM | POA: Diagnosis not present

## 2023-03-09 DIAGNOSIS — M62551 Muscle wasting and atrophy, not elsewhere classified, right thigh: Secondary | ICD-10-CM | POA: Diagnosis not present

## 2023-03-09 DIAGNOSIS — M62511 Muscle wasting and atrophy, not elsewhere classified, right shoulder: Secondary | ICD-10-CM | POA: Diagnosis not present

## 2023-03-09 DIAGNOSIS — R41841 Cognitive communication deficit: Secondary | ICD-10-CM | POA: Diagnosis not present

## 2023-03-09 DIAGNOSIS — R488 Other symbolic dysfunctions: Secondary | ICD-10-CM | POA: Diagnosis not present

## 2023-03-09 DIAGNOSIS — R4789 Other speech disturbances: Secondary | ICD-10-CM | POA: Diagnosis not present

## 2023-03-13 DIAGNOSIS — R41841 Cognitive communication deficit: Secondary | ICD-10-CM | POA: Diagnosis not present

## 2023-03-13 DIAGNOSIS — R4789 Other speech disturbances: Secondary | ICD-10-CM | POA: Diagnosis not present

## 2023-03-13 DIAGNOSIS — M62511 Muscle wasting and atrophy, not elsewhere classified, right shoulder: Secondary | ICD-10-CM | POA: Diagnosis not present

## 2023-03-13 DIAGNOSIS — F5105 Insomnia due to other mental disorder: Secondary | ICD-10-CM | POA: Diagnosis not present

## 2023-03-13 DIAGNOSIS — M62551 Muscle wasting and atrophy, not elsewhere classified, right thigh: Secondary | ICD-10-CM | POA: Diagnosis not present

## 2023-03-13 DIAGNOSIS — F411 Generalized anxiety disorder: Secondary | ICD-10-CM | POA: Diagnosis not present

## 2023-03-13 DIAGNOSIS — F0390 Unspecified dementia without behavioral disturbance: Secondary | ICD-10-CM | POA: Diagnosis not present

## 2023-03-13 DIAGNOSIS — R488 Other symbolic dysfunctions: Secondary | ICD-10-CM | POA: Diagnosis not present

## 2023-03-13 DIAGNOSIS — R2689 Other abnormalities of gait and mobility: Secondary | ICD-10-CM | POA: Diagnosis not present

## 2023-03-13 DIAGNOSIS — F339 Major depressive disorder, recurrent, unspecified: Secondary | ICD-10-CM | POA: Diagnosis not present

## 2023-03-14 DIAGNOSIS — R2689 Other abnormalities of gait and mobility: Secondary | ICD-10-CM | POA: Diagnosis not present

## 2023-03-14 DIAGNOSIS — M62551 Muscle wasting and atrophy, not elsewhere classified, right thigh: Secondary | ICD-10-CM | POA: Diagnosis not present

## 2023-03-14 DIAGNOSIS — R488 Other symbolic dysfunctions: Secondary | ICD-10-CM | POA: Diagnosis not present

## 2023-03-14 DIAGNOSIS — M62511 Muscle wasting and atrophy, not elsewhere classified, right shoulder: Secondary | ICD-10-CM | POA: Diagnosis not present

## 2023-03-14 DIAGNOSIS — R41841 Cognitive communication deficit: Secondary | ICD-10-CM | POA: Diagnosis not present

## 2023-03-14 DIAGNOSIS — R4789 Other speech disturbances: Secondary | ICD-10-CM | POA: Diagnosis not present

## 2023-03-15 DIAGNOSIS — M62551 Muscle wasting and atrophy, not elsewhere classified, right thigh: Secondary | ICD-10-CM | POA: Diagnosis not present

## 2023-03-15 DIAGNOSIS — M62511 Muscle wasting and atrophy, not elsewhere classified, right shoulder: Secondary | ICD-10-CM | POA: Diagnosis not present

## 2023-03-15 DIAGNOSIS — R41841 Cognitive communication deficit: Secondary | ICD-10-CM | POA: Diagnosis not present

## 2023-03-15 DIAGNOSIS — R4789 Other speech disturbances: Secondary | ICD-10-CM | POA: Diagnosis not present

## 2023-03-15 DIAGNOSIS — R488 Other symbolic dysfunctions: Secondary | ICD-10-CM | POA: Diagnosis not present

## 2023-03-15 DIAGNOSIS — R2689 Other abnormalities of gait and mobility: Secondary | ICD-10-CM | POA: Diagnosis not present

## 2023-03-16 DIAGNOSIS — R2689 Other abnormalities of gait and mobility: Secondary | ICD-10-CM | POA: Diagnosis not present

## 2023-03-16 DIAGNOSIS — R488 Other symbolic dysfunctions: Secondary | ICD-10-CM | POA: Diagnosis not present

## 2023-03-16 DIAGNOSIS — M62551 Muscle wasting and atrophy, not elsewhere classified, right thigh: Secondary | ICD-10-CM | POA: Diagnosis not present

## 2023-03-16 DIAGNOSIS — R4789 Other speech disturbances: Secondary | ICD-10-CM | POA: Diagnosis not present

## 2023-03-16 DIAGNOSIS — M62511 Muscle wasting and atrophy, not elsewhere classified, right shoulder: Secondary | ICD-10-CM | POA: Diagnosis not present

## 2023-03-16 DIAGNOSIS — R41841 Cognitive communication deficit: Secondary | ICD-10-CM | POA: Diagnosis not present

## 2023-03-17 DIAGNOSIS — R488 Other symbolic dysfunctions: Secondary | ICD-10-CM | POA: Diagnosis not present

## 2023-03-17 DIAGNOSIS — R2689 Other abnormalities of gait and mobility: Secondary | ICD-10-CM | POA: Diagnosis not present

## 2023-03-17 DIAGNOSIS — M62551 Muscle wasting and atrophy, not elsewhere classified, right thigh: Secondary | ICD-10-CM | POA: Diagnosis not present

## 2023-03-17 DIAGNOSIS — M62511 Muscle wasting and atrophy, not elsewhere classified, right shoulder: Secondary | ICD-10-CM | POA: Diagnosis not present

## 2023-03-17 DIAGNOSIS — M62512 Muscle wasting and atrophy, not elsewhere classified, left shoulder: Secondary | ICD-10-CM | POA: Diagnosis not present

## 2023-03-17 DIAGNOSIS — M62561 Muscle wasting and atrophy, not elsewhere classified, right lower leg: Secondary | ICD-10-CM | POA: Diagnosis not present

## 2023-03-17 DIAGNOSIS — M62562 Muscle wasting and atrophy, not elsewhere classified, left lower leg: Secondary | ICD-10-CM | POA: Diagnosis not present

## 2023-03-17 DIAGNOSIS — M62552 Muscle wasting and atrophy, not elsewhere classified, left thigh: Secondary | ICD-10-CM | POA: Diagnosis not present

## 2023-03-17 DIAGNOSIS — R4789 Other speech disturbances: Secondary | ICD-10-CM | POA: Diagnosis not present

## 2023-03-17 DIAGNOSIS — R41841 Cognitive communication deficit: Secondary | ICD-10-CM | POA: Diagnosis not present

## 2023-03-19 DIAGNOSIS — M62511 Muscle wasting and atrophy, not elsewhere classified, right shoulder: Secondary | ICD-10-CM | POA: Diagnosis not present

## 2023-03-19 DIAGNOSIS — R4789 Other speech disturbances: Secondary | ICD-10-CM | POA: Diagnosis not present

## 2023-03-19 DIAGNOSIS — E559 Vitamin D deficiency, unspecified: Secondary | ICD-10-CM | POA: Diagnosis not present

## 2023-03-19 DIAGNOSIS — M62552 Muscle wasting and atrophy, not elsewhere classified, left thigh: Secondary | ICD-10-CM | POA: Diagnosis not present

## 2023-03-19 DIAGNOSIS — E119 Type 2 diabetes mellitus without complications: Secondary | ICD-10-CM | POA: Diagnosis not present

## 2023-03-19 DIAGNOSIS — R41841 Cognitive communication deficit: Secondary | ICD-10-CM | POA: Diagnosis not present

## 2023-03-19 DIAGNOSIS — M62551 Muscle wasting and atrophy, not elsewhere classified, right thigh: Secondary | ICD-10-CM | POA: Diagnosis not present

## 2023-03-19 DIAGNOSIS — E039 Hypothyroidism, unspecified: Secondary | ICD-10-CM | POA: Diagnosis not present

## 2023-03-19 DIAGNOSIS — D519 Vitamin B12 deficiency anemia, unspecified: Secondary | ICD-10-CM | POA: Diagnosis not present

## 2023-03-19 DIAGNOSIS — F03B4 Unspecified dementia, moderate, with anxiety: Secondary | ICD-10-CM | POA: Diagnosis not present

## 2023-03-19 DIAGNOSIS — E782 Mixed hyperlipidemia: Secondary | ICD-10-CM | POA: Diagnosis not present

## 2023-03-19 DIAGNOSIS — G4700 Insomnia, unspecified: Secondary | ICD-10-CM | POA: Diagnosis not present

## 2023-03-19 DIAGNOSIS — I10 Essential (primary) hypertension: Secondary | ICD-10-CM | POA: Diagnosis not present

## 2023-03-19 DIAGNOSIS — E876 Hypokalemia: Secondary | ICD-10-CM | POA: Diagnosis not present

## 2023-03-19 DIAGNOSIS — R488 Other symbolic dysfunctions: Secondary | ICD-10-CM | POA: Diagnosis not present

## 2023-03-20 DIAGNOSIS — R488 Other symbolic dysfunctions: Secondary | ICD-10-CM | POA: Diagnosis not present

## 2023-03-20 DIAGNOSIS — R4789 Other speech disturbances: Secondary | ICD-10-CM | POA: Diagnosis not present

## 2023-03-20 DIAGNOSIS — M62511 Muscle wasting and atrophy, not elsewhere classified, right shoulder: Secondary | ICD-10-CM | POA: Diagnosis not present

## 2023-03-20 DIAGNOSIS — M62552 Muscle wasting and atrophy, not elsewhere classified, left thigh: Secondary | ICD-10-CM | POA: Diagnosis not present

## 2023-03-20 DIAGNOSIS — R41841 Cognitive communication deficit: Secondary | ICD-10-CM | POA: Diagnosis not present

## 2023-03-20 DIAGNOSIS — M62551 Muscle wasting and atrophy, not elsewhere classified, right thigh: Secondary | ICD-10-CM | POA: Diagnosis not present

## 2023-03-21 ENCOUNTER — Encounter: Payer: Self-pay | Admitting: Physician Assistant

## 2023-03-21 ENCOUNTER — Ambulatory Visit (INDEPENDENT_AMBULATORY_CARE_PROVIDER_SITE_OTHER): Payer: Medicare Other | Admitting: Physician Assistant

## 2023-03-21 VITALS — BP 110/70 | HR 70 | Resp 20 | Ht 62.0 in

## 2023-03-21 DIAGNOSIS — R488 Other symbolic dysfunctions: Secondary | ICD-10-CM | POA: Diagnosis not present

## 2023-03-21 DIAGNOSIS — F03918 Unspecified dementia, unspecified severity, with other behavioral disturbance: Secondary | ICD-10-CM

## 2023-03-21 DIAGNOSIS — M62552 Muscle wasting and atrophy, not elsewhere classified, left thigh: Secondary | ICD-10-CM | POA: Diagnosis not present

## 2023-03-21 DIAGNOSIS — M62511 Muscle wasting and atrophy, not elsewhere classified, right shoulder: Secondary | ICD-10-CM | POA: Diagnosis not present

## 2023-03-21 DIAGNOSIS — M62551 Muscle wasting and atrophy, not elsewhere classified, right thigh: Secondary | ICD-10-CM | POA: Diagnosis not present

## 2023-03-21 DIAGNOSIS — R41841 Cognitive communication deficit: Secondary | ICD-10-CM | POA: Diagnosis not present

## 2023-03-21 DIAGNOSIS — R4789 Other speech disturbances: Secondary | ICD-10-CM | POA: Diagnosis not present

## 2023-03-21 MED ORDER — DIVALPROEX SODIUM 125 MG PO DR TAB: 125.0000 mg | DELAYED_RELEASE_TABLET | Freq: Two times a day (BID) | ORAL | 3 refills | Status: AC

## 2023-03-21 MED ORDER — RIVASTIGMINE TARTRATE 6 MG PO CAPS: 6.0000 mg | ORAL_CAPSULE | Freq: Two times a day (BID) | ORAL | 11 refills | Status: AC

## 2023-03-21 NOTE — Progress Notes (Signed)
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 RH female with a history of hyperlipidemia, hypertension, mild carotid artery disease, osteoporosis, history of pulmonary nodules, hypothyroidism, seen today in follow up for memory loss. Patient is currently on rivastigmine 4.5 mg twice daily.  Cognitive status has shown a decline.  She requires 24/7 monitoring.  She now lives in the memory care facility and is enjoying the activities presented to her there.  It was controlled with Depakote 125 mg twice daily    Follow up in 6 months. increase memantine to 6 mg twice daily.  Side effects discussed Continue Depakote 125 mg twice daily .Side effects discussed. Continue 24/7 monitoring for safety.  Patient is now on [facility] memory care. Recommend good control of her cardiovascular risk factors     Subjective:    This patient is accompanied in the office by her sister  who supplements the history.  Previous records as well as any outside records available were reviewed prior to todays visit. Patient was last seen on 09/04/2022.  Last MMSE on 02/27/2022 was 19/30    Any changes in memory since last visit?  Her sister reports that her memory is worse, but still conversant.  She is now at [facility] memory care  11/15/22, and is able to participate in the activities that the facility offers, increasing her social and cognitive stimulation. She recognizes her family and friends  repeats oneself?  Endorsed Disoriented when walking into a room?  Patient denies  Leaving objects in unusual places?  She loses stuff quite often,especially phone and room key, but not in unusual places.  Wandering behavior?  She wanted to go back  to [place] in a 20 degree weather, so she was transferred to Memory care from ALF for safety .  Any personality changes since last visit?.   she is combative with the  caregivers, especially trying to wash her hair  Any worsening depression?:  She has a history of depression, after the death of her husband.  She takes Remeron with good control. Hallucinations or paranoia?  She has chronic paranoia about having things stolen from her. Less sundowners since on Depakote Seizures?    denies    Any sleep changes?  Sleeps well. Denies vivid dreams, REM behavior or sleepwalking   Sleep apnea?   denies   Any hygiene concerns?  As before, she does not like showering, or  washing her hair.  She does not wipe well according to her sister independent of bathing and dressing?  Endorsed  Does the patient needs help with medications?  Facility is in charge   Who is in charge of the finances?  Sister is in charge     Any changes in appetite?  "The meals are good"-but she is  not a good water drinker".    Patient have trouble swallowing?  denies   Does the patient cook?   Any headaches?   denies   Chronic back pain  denies   Ambulates with difficulty?  Uses a walker for stability. Does PT and OT and ST  2 times a week Recent falls or head injuries? denies     Unilateral weakness, numbness or tingling?    denies   Any tremors?  denies Any anosmia?  Patient denies   Any incontinence of urine?  Endorsed.  She has a history of cystitis and recurrent UTIs, none for  the  last few months since is on antibiotics .  Any bowel dysfunction? denies      Patient lives at [facility]  Does the patient drive?  She no longer drives.  " History of Present Illness 02/14/2019:  This is an 87 year old right-handed woman with a history of hypertension, hyperlipidemia, hypothyroidism, presenting for evaluation of worsening memory. Memory concerns were raised by family, patient has no insight into her condition and was upset throughout most of the visit (but participated). She states "I forget things, but if you are asking if I have dementia, I don't." She thinks her memory is good.  Family started noticing memory changes for several years, worsening recently where she is very forgetful, repeating things constantly. They got 10 Christmas cards from her because she did not remember sending them. Felicia Kelly reports "she has trouble admitting she has issues." She has been living alone for the past 9 years since her husband passed away. She manages finances and medications and denies any difficulties doing these, although Felicia Kelly reports that they had to help her last March because she could not get her taxes together, she seemed to have trouble keeping up with things. She continues to drive and denies getting lost. Felicia Kelly reports she misplaces things frequently (she denies this). Felicia Kelly has noticed more irritability since they approached her about memory concerns last March, no paranoia or hallucinations. She is independent with dressing and bathing, no hygiene concerns, house is well-kept. No family history of dementia, no history of significant head injuries or alcohol use.   She denies any headaches, dizziness, diplopia, dysarthria/dysphagia, neck/back pain, focal numbness/tingling/weakness, bowel/bladder dysfunction, anosmia, or tremors. Sleep is good. She denies any falls."    PREVIOUS MEDICATIONS: Donezepil d/c 05/21/2019    CURRENT MEDICATIONS:  Outpatient Encounter Medications as of 03/21/2023  Medication Sig   amLODipine (NORVASC) 5 MG tablet TAKE ONE TABLET BY MOUTH AT BEDTIME   aspirin EC 81 MG tablet Take 81 mg by mouth every evening. Swallow whole.   Cholecalciferol (VITAMIN D-3) 25 MCG (1000 UT) CAPS Take 1,000 Units by mouth every Monday, Wednesday, and Friday.   cyanocobalamin 100 MCG tablet Take 100 mcg by mouth daily.   ezetimibe (ZETIA) 10 MG tablet Take 1 tablet (10 mg total) by mouth daily.   levothyroxine (SYNTHROID) 50 MCG tablet Take 1 tablet by mouth 6 days a week, then take 2 tablets on Sundays. 30 days   Magnesium 250 MG TABS Take 250 mg by mouth daily.    melatonin 1 MG TABS tablet Take 1 mg by mouth at bedtime.   METAMUCIL FIBER PO Take 1 capsule by mouth every evening.   metFORMIN (GLUCOPHAGE-XR) 500 MG 24 hr tablet Take 1 tablet (500 mg total) by mouth daily with breakfast.   mirtazapine (REMERON) 30 MG tablet TAKE 1 TABLET BY MOUTH DAILY AT BEDTIME **NO REFILLS**   Multiple Vitamin (MULTIVITAMIN) tablet Take 1 tablet by mouth daily with breakfast.   Multiple Vitamins-Minerals (OCUVITE EYE HEALTH FORMULA) CAPS Take 1 capsule by mouth in the morning.   potassium chloride (KLOR-CON M) 10 MEQ tablet Take 1 tablet (10 mEq total) by mouth daily.   potassium chloride (KLOR-CON) 10 MEQ tablet Take 1 tablet by mouth three times a week   pravastatin (PRAVACHOL) 40 MG tablet TAKE 1 TABLET ONCE DAILY. (Patient taking differently: Take 40 mg by mouth every evening.)   Probiotic Product (PROBIOTIC PO) Take 1 capsule by mouth every evening.   valACYclovir (VALTREX) 500 MG  tablet Take 1 tablet (500 mg total) by mouth daily.   [DISCONTINUED] divalproex (DEPAKOTE) 125 MG DR tablet Take 1 tablet (125 mg total) by mouth 2 (two) times daily.   [DISCONTINUED] rivastigmine (EXELON) 4.5 MG capsule Take 1 capsule (4.5 mg total) by mouth 2 (two) times daily.   divalproex (DEPAKOTE) 125 MG DR tablet Take 1 tablet (125 mg total) by mouth 2 (two) times daily.   rivastigmine (EXELON) 6 MG capsule Take 1 capsule (6 mg total) by mouth 2 (two) times daily.   No facility-administered encounter medications on file as of 03/21/2023.       02/27/2022    1:00 PM 01/11/2021    2:00 PM 05/14/2018   11:41 AM  MMSE - Mini Mental State Exam  Orientation to time 0 5 5  Orientation to Place 5 5 5   Registration 3 3 3   Attention/ Calculation 4 5 4   Recall 0 1 2  Language- name 2 objects 2 2 2   Language- repeat 1 1 1   Language- follow 3 step command 2 3 3   Language- read & follow direction 1 1 1   Write a sentence 1 1 1   Copy design 0 0 1  Total score 19 27 28       02/14/2019     3:00 PM  Montreal Cognitive Assessment   Visuospatial/ Executive (0/5) 0  Naming (0/3) 0  Attention: Read list of digits (0/2) 2  Attention: Read list of letters (0/1) 0  Attention: Serial 7 subtraction starting at 100 (0/3) 2  Language: Repeat phrase (0/2) 2  Language : Fluency (0/1) 0  Abstraction (0/2) 1  Delayed Recall (0/5) 5  Orientation (0/6) 4  Total 16  Adjusted Score (based on education) 17    Objective:     PHYSICAL EXAMINATION:    VITALS:   Vitals:   03/21/23 1423  BP: 110/70  Pulse: 70  Resp: 20  SpO2: 98%  Height: 5\' 2"  (1.575 m)    GEN:  The patient appears stated age and is in NAD. HEENT:  Normocephalic, atraumatic.   Neurological examination:  General: NAD, well-groomed, appears stated age. Orientation: The patient is alert. Oriented to person, not to place and date Cranial nerves: There is good facial symmetry.The speech is not very fluent but clear. No aphasia or dysarthria. Fund of knowledge is reduced. Recent and remote memory are impaired. Attention and concentration are reduced.  Able to name objects and unable to repeat phrases.  Hearing is intact to conversational tone.   Sensation: Sensation is intact to light touch throughout Motor: Strength is at least antigravity x4. DTR's 2/4 in UE/LE     Movement examination: Tone: There is normal tone in the UE/LE Abnormal movements:  no tremor.  No myoclonus.  No asterixis.   Coordination:  There is no decremation with RAM's. Normal finger to nose  Gait and Station: The patient has no difficulty arising out of a deep-seated chair without the use of the hands.uses a walker for stability. The patient's stride length is good.  Gait is cautious and narrow.    Thank you for allowing [place] the opportunity to participate in the care of this nice patient. Please do not hesitate to contact us for any questions or concerns.   Total time spent on today's visit was 30 minutes dedicated to this patient today,  preparing to see patient, examining the patient, ordering tests and/or medications and counseling the patient, documenting clinical information in the EHR or other health  record, independently interpreting results and communicating results to the patient/family, discussing treatment and goals, answering patient's questions and coordinating care.  Cc:  Karie Georges, MD  Marlowe Kays 03/21/2023 6:28 PM

## 2023-03-21 NOTE — Patient Instructions (Addendum)
Good to see you!  1.Increase Rivastigmine to  6 mg twice a day  2. Continue with physical exercise, brain stimulation exercises, PT, speech therapy   control of blood pressure, cholesterol, sugar levels  Continue Depakote 125 mg night,  2 times a day   3. Follow-up in 6 months, call for any changes   FALL PRECAUTIONS: Be cautious when walking. Scan the area for obstacles that may increase the risk of trips and falls. When getting up in the mornings, sit up at the edge of the bed for a few minutes before getting out of bed. Consider elevating the bed at the head end to avoid drop of blood pressure when getting up. Walk always in a well-lit room (use night lights in the walls). Avoid area rugs or power cords from appliances in the middle of the walkways. Use a walker or a cane if necessary and consider physical therapy for balance exercise. Get your eyesight checked regularly.   HOME SAFETY: Consider the safety of the kitchen when operating appliances like stoves, microwave oven, and blender. Consider having supervision and share cooking responsibilities until no longer able to participate in those. Accidents with firearms and other hazards in the house should be identified and addressed as well.   ABILITY TO BE LEFT ALONE: If patient is unable to contact 911 operator, consider using LifeLine, or when the need is there, arrange for someone to stay with patients. Smoking is a fire hazard, consider supervision or cessation. Risk of wandering should be assessed by caregiver and if detected at any point, supervision and safe proof recommendations should be instituted.  RECOMMENDATIONS FOR ALL PATIENTS WITH MEMORY PROBLEMS: 1. Continue to exercise (Recommend 30 minutes of walking everyday, or 3 hours every week) 2. Increase social interactions - continue going to [place] and enjoy social gatherings with friends and family 3. Eat healthy, avoid fried foods and eat more fruits and vegetables 4.  Maintain adequate blood pressure, blood sugar, and blood cholesterol level. Reducing the risk of stroke and cardiovascular disease also helps promoting better memory. 5. Avoid stressful situations. Live a simple life and avoid aggravations. Organize your time and prepare for the next day in anticipation. 6. Sleep well, avoid any interruptions of sleep and avoid any distractions in the bedroom that may interfere with adequate sleep quality 7. Avoid sugar, avoid sweets as there is a strong link between excessive sugar intake, diabetes, and cognitive impairment We discussed the Mediterranean diet, which has been shown to help patients reduce the risk of progressive memory disorders and reduces cardiovascular risk. This includes eating fish, eat fruits and green leafy vegetables, nuts like almonds and hazelnuts, walnuts, and also use olive oil. Avoid fast foods and fried foods as much as possible. Avoid sweets and sugar as sugar use has been linked to worsening of memory function.  There is always a concern of gradual progression of memory problems. If this is the case, then we may need to adjust level of care according to patient needs. Support, both to the patient and caregiver, should then be put into place.

## 2023-03-22 DIAGNOSIS — M62511 Muscle wasting and atrophy, not elsewhere classified, right shoulder: Secondary | ICD-10-CM | POA: Diagnosis not present

## 2023-03-22 DIAGNOSIS — R4789 Other speech disturbances: Secondary | ICD-10-CM | POA: Diagnosis not present

## 2023-03-22 DIAGNOSIS — M62552 Muscle wasting and atrophy, not elsewhere classified, left thigh: Secondary | ICD-10-CM | POA: Diagnosis not present

## 2023-03-22 DIAGNOSIS — R488 Other symbolic dysfunctions: Secondary | ICD-10-CM | POA: Diagnosis not present

## 2023-03-22 DIAGNOSIS — R41841 Cognitive communication deficit: Secondary | ICD-10-CM | POA: Diagnosis not present

## 2023-03-22 DIAGNOSIS — M62551 Muscle wasting and atrophy, not elsewhere classified, right thigh: Secondary | ICD-10-CM | POA: Diagnosis not present

## 2023-03-23 DIAGNOSIS — R488 Other symbolic dysfunctions: Secondary | ICD-10-CM | POA: Diagnosis not present

## 2023-03-23 DIAGNOSIS — M62511 Muscle wasting and atrophy, not elsewhere classified, right shoulder: Secondary | ICD-10-CM | POA: Diagnosis not present

## 2023-03-23 DIAGNOSIS — R4789 Other speech disturbances: Secondary | ICD-10-CM | POA: Diagnosis not present

## 2023-03-23 DIAGNOSIS — M62552 Muscle wasting and atrophy, not elsewhere classified, left thigh: Secondary | ICD-10-CM | POA: Diagnosis not present

## 2023-03-23 DIAGNOSIS — R41841 Cognitive communication deficit: Secondary | ICD-10-CM | POA: Diagnosis not present

## 2023-03-23 DIAGNOSIS — M62551 Muscle wasting and atrophy, not elsewhere classified, right thigh: Secondary | ICD-10-CM | POA: Diagnosis not present

## 2023-03-26 ENCOUNTER — Ambulatory Visit: Payer: BC Managed Care – PPO | Admitting: Family Medicine

## 2023-03-26 DIAGNOSIS — M62551 Muscle wasting and atrophy, not elsewhere classified, right thigh: Secondary | ICD-10-CM | POA: Diagnosis not present

## 2023-03-26 DIAGNOSIS — R41841 Cognitive communication deficit: Secondary | ICD-10-CM | POA: Diagnosis not present

## 2023-03-26 DIAGNOSIS — M62552 Muscle wasting and atrophy, not elsewhere classified, left thigh: Secondary | ICD-10-CM | POA: Diagnosis not present

## 2023-03-26 DIAGNOSIS — R488 Other symbolic dysfunctions: Secondary | ICD-10-CM | POA: Diagnosis not present

## 2023-03-26 DIAGNOSIS — R4789 Other speech disturbances: Secondary | ICD-10-CM | POA: Diagnosis not present

## 2023-03-26 DIAGNOSIS — M62511 Muscle wasting and atrophy, not elsewhere classified, right shoulder: Secondary | ICD-10-CM | POA: Diagnosis not present

## 2023-03-27 DIAGNOSIS — M62552 Muscle wasting and atrophy, not elsewhere classified, left thigh: Secondary | ICD-10-CM | POA: Diagnosis not present

## 2023-03-27 DIAGNOSIS — R41841 Cognitive communication deficit: Secondary | ICD-10-CM | POA: Diagnosis not present

## 2023-03-27 DIAGNOSIS — R4789 Other speech disturbances: Secondary | ICD-10-CM | POA: Diagnosis not present

## 2023-03-27 DIAGNOSIS — M62511 Muscle wasting and atrophy, not elsewhere classified, right shoulder: Secondary | ICD-10-CM | POA: Diagnosis not present

## 2023-03-27 DIAGNOSIS — R488 Other symbolic dysfunctions: Secondary | ICD-10-CM | POA: Diagnosis not present

## 2023-03-27 DIAGNOSIS — M62551 Muscle wasting and atrophy, not elsewhere classified, right thigh: Secondary | ICD-10-CM | POA: Diagnosis not present

## 2023-03-28 DIAGNOSIS — M62552 Muscle wasting and atrophy, not elsewhere classified, left thigh: Secondary | ICD-10-CM | POA: Diagnosis not present

## 2023-03-28 DIAGNOSIS — R41841 Cognitive communication deficit: Secondary | ICD-10-CM | POA: Diagnosis not present

## 2023-03-28 DIAGNOSIS — R488 Other symbolic dysfunctions: Secondary | ICD-10-CM | POA: Diagnosis not present

## 2023-03-28 DIAGNOSIS — M62511 Muscle wasting and atrophy, not elsewhere classified, right shoulder: Secondary | ICD-10-CM | POA: Diagnosis not present

## 2023-03-28 DIAGNOSIS — R4789 Other speech disturbances: Secondary | ICD-10-CM | POA: Diagnosis not present

## 2023-03-28 DIAGNOSIS — M62551 Muscle wasting and atrophy, not elsewhere classified, right thigh: Secondary | ICD-10-CM | POA: Diagnosis not present

## 2023-03-29 DIAGNOSIS — M62552 Muscle wasting and atrophy, not elsewhere classified, left thigh: Secondary | ICD-10-CM | POA: Diagnosis not present

## 2023-03-29 DIAGNOSIS — R488 Other symbolic dysfunctions: Secondary | ICD-10-CM | POA: Diagnosis not present

## 2023-03-29 DIAGNOSIS — M62511 Muscle wasting and atrophy, not elsewhere classified, right shoulder: Secondary | ICD-10-CM | POA: Diagnosis not present

## 2023-03-29 DIAGNOSIS — R4789 Other speech disturbances: Secondary | ICD-10-CM | POA: Diagnosis not present

## 2023-03-29 DIAGNOSIS — R41841 Cognitive communication deficit: Secondary | ICD-10-CM | POA: Diagnosis not present

## 2023-03-29 DIAGNOSIS — M62551 Muscle wasting and atrophy, not elsewhere classified, right thigh: Secondary | ICD-10-CM | POA: Diagnosis not present

## 2023-03-30 DIAGNOSIS — R4789 Other speech disturbances: Secondary | ICD-10-CM | POA: Diagnosis not present

## 2023-03-30 DIAGNOSIS — M62551 Muscle wasting and atrophy, not elsewhere classified, right thigh: Secondary | ICD-10-CM | POA: Diagnosis not present

## 2023-03-30 DIAGNOSIS — R488 Other symbolic dysfunctions: Secondary | ICD-10-CM | POA: Diagnosis not present

## 2023-03-30 DIAGNOSIS — R41841 Cognitive communication deficit: Secondary | ICD-10-CM | POA: Diagnosis not present

## 2023-03-30 DIAGNOSIS — M62511 Muscle wasting and atrophy, not elsewhere classified, right shoulder: Secondary | ICD-10-CM | POA: Diagnosis not present

## 2023-03-30 DIAGNOSIS — M62552 Muscle wasting and atrophy, not elsewhere classified, left thigh: Secondary | ICD-10-CM | POA: Diagnosis not present

## 2023-04-01 DIAGNOSIS — R4789 Other speech disturbances: Secondary | ICD-10-CM | POA: Diagnosis not present

## 2023-04-01 DIAGNOSIS — M62552 Muscle wasting and atrophy, not elsewhere classified, left thigh: Secondary | ICD-10-CM | POA: Diagnosis not present

## 2023-04-01 DIAGNOSIS — M62551 Muscle wasting and atrophy, not elsewhere classified, right thigh: Secondary | ICD-10-CM | POA: Diagnosis not present

## 2023-04-01 DIAGNOSIS — R488 Other symbolic dysfunctions: Secondary | ICD-10-CM | POA: Diagnosis not present

## 2023-04-01 DIAGNOSIS — R41841 Cognitive communication deficit: Secondary | ICD-10-CM | POA: Diagnosis not present

## 2023-04-01 DIAGNOSIS — M62511 Muscle wasting and atrophy, not elsewhere classified, right shoulder: Secondary | ICD-10-CM | POA: Diagnosis not present

## 2023-04-02 DIAGNOSIS — M62551 Muscle wasting and atrophy, not elsewhere classified, right thigh: Secondary | ICD-10-CM | POA: Diagnosis not present

## 2023-04-02 DIAGNOSIS — M62511 Muscle wasting and atrophy, not elsewhere classified, right shoulder: Secondary | ICD-10-CM | POA: Diagnosis not present

## 2023-04-02 DIAGNOSIS — R41841 Cognitive communication deficit: Secondary | ICD-10-CM | POA: Diagnosis not present

## 2023-04-02 DIAGNOSIS — R488 Other symbolic dysfunctions: Secondary | ICD-10-CM | POA: Diagnosis not present

## 2023-04-02 DIAGNOSIS — M62552 Muscle wasting and atrophy, not elsewhere classified, left thigh: Secondary | ICD-10-CM | POA: Diagnosis not present

## 2023-04-02 DIAGNOSIS — R4789 Other speech disturbances: Secondary | ICD-10-CM | POA: Diagnosis not present

## 2023-04-03 DIAGNOSIS — M62552 Muscle wasting and atrophy, not elsewhere classified, left thigh: Secondary | ICD-10-CM | POA: Diagnosis not present

## 2023-04-03 DIAGNOSIS — R4789 Other speech disturbances: Secondary | ICD-10-CM | POA: Diagnosis not present

## 2023-04-03 DIAGNOSIS — R488 Other symbolic dysfunctions: Secondary | ICD-10-CM | POA: Diagnosis not present

## 2023-04-03 DIAGNOSIS — M62511 Muscle wasting and atrophy, not elsewhere classified, right shoulder: Secondary | ICD-10-CM | POA: Diagnosis not present

## 2023-04-03 DIAGNOSIS — M62551 Muscle wasting and atrophy, not elsewhere classified, right thigh: Secondary | ICD-10-CM | POA: Diagnosis not present

## 2023-04-03 DIAGNOSIS — R41841 Cognitive communication deficit: Secondary | ICD-10-CM | POA: Diagnosis not present

## 2023-04-04 DIAGNOSIS — R41841 Cognitive communication deficit: Secondary | ICD-10-CM | POA: Diagnosis not present

## 2023-04-04 DIAGNOSIS — R488 Other symbolic dysfunctions: Secondary | ICD-10-CM | POA: Diagnosis not present

## 2023-04-04 DIAGNOSIS — M62551 Muscle wasting and atrophy, not elsewhere classified, right thigh: Secondary | ICD-10-CM | POA: Diagnosis not present

## 2023-04-04 DIAGNOSIS — M62511 Muscle wasting and atrophy, not elsewhere classified, right shoulder: Secondary | ICD-10-CM | POA: Diagnosis not present

## 2023-04-04 DIAGNOSIS — M62552 Muscle wasting and atrophy, not elsewhere classified, left thigh: Secondary | ICD-10-CM | POA: Diagnosis not present

## 2023-04-04 DIAGNOSIS — R4789 Other speech disturbances: Secondary | ICD-10-CM | POA: Diagnosis not present

## 2023-04-05 DIAGNOSIS — M62511 Muscle wasting and atrophy, not elsewhere classified, right shoulder: Secondary | ICD-10-CM | POA: Diagnosis not present

## 2023-04-05 DIAGNOSIS — M62552 Muscle wasting and atrophy, not elsewhere classified, left thigh: Secondary | ICD-10-CM | POA: Diagnosis not present

## 2023-04-05 DIAGNOSIS — R41841 Cognitive communication deficit: Secondary | ICD-10-CM | POA: Diagnosis not present

## 2023-04-05 DIAGNOSIS — M62551 Muscle wasting and atrophy, not elsewhere classified, right thigh: Secondary | ICD-10-CM | POA: Diagnosis not present

## 2023-04-05 DIAGNOSIS — R488 Other symbolic dysfunctions: Secondary | ICD-10-CM | POA: Diagnosis not present

## 2023-04-05 DIAGNOSIS — R4789 Other speech disturbances: Secondary | ICD-10-CM | POA: Diagnosis not present

## 2023-04-06 DIAGNOSIS — R488 Other symbolic dysfunctions: Secondary | ICD-10-CM | POA: Diagnosis not present

## 2023-04-06 DIAGNOSIS — M62552 Muscle wasting and atrophy, not elsewhere classified, left thigh: Secondary | ICD-10-CM | POA: Diagnosis not present

## 2023-04-06 DIAGNOSIS — R41841 Cognitive communication deficit: Secondary | ICD-10-CM | POA: Diagnosis not present

## 2023-04-06 DIAGNOSIS — M62551 Muscle wasting and atrophy, not elsewhere classified, right thigh: Secondary | ICD-10-CM | POA: Diagnosis not present

## 2023-04-06 DIAGNOSIS — M62511 Muscle wasting and atrophy, not elsewhere classified, right shoulder: Secondary | ICD-10-CM | POA: Diagnosis not present

## 2023-04-06 DIAGNOSIS — R4789 Other speech disturbances: Secondary | ICD-10-CM | POA: Diagnosis not present

## 2023-04-09 DIAGNOSIS — F411 Generalized anxiety disorder: Secondary | ICD-10-CM | POA: Diagnosis not present

## 2023-04-09 DIAGNOSIS — M62511 Muscle wasting and atrophy, not elsewhere classified, right shoulder: Secondary | ICD-10-CM | POA: Diagnosis not present

## 2023-04-09 DIAGNOSIS — R4789 Other speech disturbances: Secondary | ICD-10-CM | POA: Diagnosis not present

## 2023-04-09 DIAGNOSIS — M62552 Muscle wasting and atrophy, not elsewhere classified, left thigh: Secondary | ICD-10-CM | POA: Diagnosis not present

## 2023-04-09 DIAGNOSIS — R41841 Cognitive communication deficit: Secondary | ICD-10-CM | POA: Diagnosis not present

## 2023-04-09 DIAGNOSIS — R488 Other symbolic dysfunctions: Secondary | ICD-10-CM | POA: Diagnosis not present

## 2023-04-09 DIAGNOSIS — M62551 Muscle wasting and atrophy, not elsewhere classified, right thigh: Secondary | ICD-10-CM | POA: Diagnosis not present

## 2023-04-09 DIAGNOSIS — F331 Major depressive disorder, recurrent, moderate: Secondary | ICD-10-CM | POA: Diagnosis not present

## 2023-04-10 DIAGNOSIS — F339 Major depressive disorder, recurrent, unspecified: Secondary | ICD-10-CM | POA: Diagnosis not present

## 2023-04-10 DIAGNOSIS — F411 Generalized anxiety disorder: Secondary | ICD-10-CM | POA: Diagnosis not present

## 2023-04-10 DIAGNOSIS — F0390 Unspecified dementia without behavioral disturbance: Secondary | ICD-10-CM | POA: Diagnosis not present

## 2023-04-10 DIAGNOSIS — G4700 Insomnia, unspecified: Secondary | ICD-10-CM | POA: Diagnosis not present

## 2023-04-11 DIAGNOSIS — R488 Other symbolic dysfunctions: Secondary | ICD-10-CM | POA: Diagnosis not present

## 2023-04-11 DIAGNOSIS — R41841 Cognitive communication deficit: Secondary | ICD-10-CM | POA: Diagnosis not present

## 2023-04-11 DIAGNOSIS — M62511 Muscle wasting and atrophy, not elsewhere classified, right shoulder: Secondary | ICD-10-CM | POA: Diagnosis not present

## 2023-04-11 DIAGNOSIS — M62551 Muscle wasting and atrophy, not elsewhere classified, right thigh: Secondary | ICD-10-CM | POA: Diagnosis not present

## 2023-04-11 DIAGNOSIS — R4789 Other speech disturbances: Secondary | ICD-10-CM | POA: Diagnosis not present

## 2023-04-11 DIAGNOSIS — M62552 Muscle wasting and atrophy, not elsewhere classified, left thigh: Secondary | ICD-10-CM | POA: Diagnosis not present

## 2023-04-11 NOTE — Progress Notes (Unsigned)
Cardiology Office Note:    Date:  04/16/2023   ID:  [address] [place], [place] Sep 09, 1936, MRN [id number]  PCP:  Karie Georges, MD   [facility] Providers Cardiologist:  Meriam Sprague, MD {   Referring MD: Karie Georges, MD    History of Present Illness:    Felicia Kelly is a 87 y.o. female with a hx of HTN, HLD, family history of CAD, and mild carotid artery disease who was previously followed by Dr. Delton See who now presents to clinic for follow-up.  Was last seen by Norma Fredrickson, NP where she was doing well. Was living at [facility] and remained active.  She presented to urgent care 02/03/2022. She has a known diagnosis of Alzheimer's. It was noted that the administrator of [facility] had called her sister and reported the patient was not herself, feeling dizzy, nauseated, and was stumbling. Her sister also noted she had been recently treated for a UTI. A urine dip 4/21 was positive for glucosuria and pyuria, and she was advised to begin cefdinir.  Was last seen in clinic on 02/2022 where she was stable from a CV standpoint.  Today, the patient is overall doing well. Continues to live at [facility]. She is doing well there. No chest pain, SOB, palpitations, orthopnea, or PND. Has occasional trace LE edema. Tolerating medications as prescribed.   Past Medical History:  Diagnosis Date   Aortic atherosclerosis (HCC)    a. noted on CT 01/2018.   Arthritis    Colonic polyp    Coronary atherosclerosis    a. noted on CT 01/2018.   Genital herpes    Hyperlipidemia    Hypertension    Mild carotid artery disease (HCC)    Osteoporosis    Overdose of cardiac medication    Pulmonary nodules    a. followed by PCP by serial Ct.   Thyroid disease    seeing Dr. Sharl Ma   Tremor     Past Surgical History:  Procedure Laterality Date   ABDOMINAL HYSTERECTOMY  1988   no cancer - reports total with bilat oophorectomy   APPENDECTOMY  1988   BREAST CYST  EXCISION Bilateral    BREAST SURGERY  1985   biopsy   FOOT SURGERY     multiple sugeries, remote   TOTAL HIP ARTHROPLASTY Bilateral     Current Medications: Current Meds  Medication Sig   amLODipine (NORVASC) 5 MG tablet TAKE ONE TABLET BY MOUTH AT BEDTIME   aspirin EC 81 MG tablet Take 81 mg by mouth every evening. Swallow whole.   Cholecalciferol (VITAMIN D-3) 25 MCG (1000 UT) CAPS Take 1,000 Units by mouth every Monday, Wednesday, and Friday.   cyanocobalamin 100 MCG tablet Take 100 mcg by mouth daily.   Dextromethorphan-guaiFENesin (CORICIDIN HBP CONGESTION/COUGH PO) Take by mouth. As needed   divalproex (DEPAKOTE) 125 MG DR tablet Take 1 tablet (125 mg total) by mouth 2 (two) times daily.   ezetimibe (ZETIA) 10 MG tablet Take 1 tablet (10 mg total) by mouth daily.   levothyroxine (SYNTHROID) 50 MCG tablet Take 1 tablet by mouth 6 days a week, then take 2 tablets on Sundays. 30 days   Magnesium 250 MG TABS Take 250 mg by mouth daily.   melatonin 1 MG TABS tablet Take 1 mg by mouth at bedtime.   METAMUCIL FIBER PO Take 1 capsule by mouth every evening.   metFORMIN (GLUCOPHAGE-XR) 500 MG 24 hr tablet Take 1 tablet (500 mg  total) by mouth daily with breakfast.   mirtazapine (REMERON) 30 MG tablet TAKE 1 TABLET BY MOUTH DAILY AT BEDTIME **NO REFILLS**   Multiple Vitamin (MULTIVITAMIN) tablet Take 1 tablet by mouth daily with breakfast.   Multiple Vitamins-Minerals (OCUVITE EYE HEALTH FORMULA) CAPS Take 1 capsule by mouth in the morning.   Nutritional Supplements (ENSURE ORIGINAL) LIQD Take by mouth. 1 per day   potassium chloride (KLOR-CON M) 10 MEQ tablet Take 1 tablet (10 mEq total) by mouth daily.   pravastatin (PRAVACHOL) 20 MG tablet Take 20 mg by mouth daily.   Probiotic Product (PROBIOTIC PO) Take 1 capsule by mouth every evening.   rivastigmine (EXELON) 6 MG capsule Take 1 capsule (6 mg total) by mouth 2 (two) times daily.   valACYclovir (VALTREX) 500 MG tablet Take 1 tablet  (500 mg total) by mouth daily.   [DISCONTINUED] pravastatin (PRAVACHOL) 40 MG tablet TAKE 1 TABLET ONCE DAILY. (Patient taking differently: Take 40 mg by mouth every evening.)     Allergies:   Tetracycline, Atorvastatin, Bactrim [sulfamethoxazole-trimethoprim], Celecoxib, Niacin, Rosuvastatin, Simvastatin, Tetracyclines & related, and Penicillins   Social History   Socioeconomic History   Marital status: Widowed    Spouse name: Not on file   Number of children: Not on file   Years of education: Not on file   Highest education level: Not on file  Occupational History   Not on file  Tobacco Use   Smoking status: Former    Types: Cigarettes    Quit date: 10/16/1978    Years since quitting: 44.5   Smokeless tobacco: Never   Tobacco comments:    1963 when her father died;  smoked for a brief period of time  Vaping Use   Vaping Use: Never used  Substance and Sexual Activity   Alcohol use: No    Alcohol/week: 0.0 standard drinks of alcohol   Drug use: No   Sexual activity: Not on file  Other Topics Concern   Not on file  Social History Narrative   Work or School: none      Home Situation: widower, lives alone      Spiritual Beliefs: Christian      Lifestyle: no regular exercise; diet is ok      Right handed         Social Determinants of Health   Financial Resource Strain: Not on file  Food Insecurity: Not on file  Transportation Needs: Not on file  Physical Activity: Not on file  Stress: Not on file  Social Connections: Not on file     Family History: The patient's family history includes Arthritis in her mother; Diabetes in her mother; Heart attack in her brother; Heart attack (age of onset: 52) in her sister; Heart disease in her father, maternal grandfather, and mother; Hyperlipidemia in her mother; Hypertension in her mother; Liver disease in her brother; Pancreatic cancer in her father. There is no history of Breast cancer.  ROS:   As per  HPI   EKGs/Labs/Other Studies Reviewed:    The following studies were reviewed today:  Right LE Venous Doppler 08/10/2020 ([facility]): IMPRESSION:  Sonographic survey of the right lower extremity negative for DVT    Carotid Doppler Summary 01/2020:  Right Carotid: Velocities in the right ICA are consistent with a 1-39%  stenosis.   Left Carotid: Velocities in the left ICA are consistent with a 1-39%  stenosis.   Vertebrals:  Bilateral vertebral arteries demonstrate antegrade flow.  Subclavians: Normal  flow hemodynamics were seen in bilateral subclavian  arteries.   *See table(s) above for measurements and observations.   Electronically signed by Julien Nordmann MD on 02/02/2020 at 6:36:41 PM.     NST 2017 Study Highlights     Nuclear stress EF: 61%. There was no ST segment deviation noted during stress. Blood pressure demonstrated a normal response to exercise. The study is normal. This is a low risk study. The left ventricular ejection fraction is normal (55-65%).   Normal exercise nuclear stress test with no evidence of prior infarct or ischemia.  Normal exercise capacity. Normal BP response to stress.     EKG:  No new tracing  Recent Labs: 06/02/2022: ALT 44; BUN 20; Creatinine, Ser 0.67; Hemoglobin 13.1; Magnesium 2.1; Platelets 275; Potassium 3.8; Sodium 140   Recent Lipid Panel    Component Value Date/Time   CHOL 137 12/21/2021 1609   CHOL 156 08/03/2020 1048   TRIG 142.0 12/21/2021 1609   HDL 48.90 12/21/2021 1609   HDL 41 08/03/2020 1048   CHOLHDL 3 12/21/2021 1609   VLDL 28.4 12/21/2021 1609   LDLCALC 59 12/21/2021 1609   LDLCALC 83 08/03/2020 1048     Risk Assessment/Calculations:           Physical Exam:    VS:  BP (!) 114/58   Pulse 84   Ht 5\' 2"  (1.575 m)   SpO2 95%   BMI 25.75 kg/m     Wt Readings from Last 3 Encounters:  09/22/22 140 lb 12.8 oz (63.9 kg)  09/04/22 139 lb (63 kg)  07/25/22 138 lb (62.6 kg)     GEN:  Comfortable, answering questions appropriately HEENT: Normal NECK: No JVD; No carotid bruits CARDIAC: RRR, 1/6 systolic murmur RESPIRATORY:  Clear to auscultation without rales, wheezing or rhonchi  ABDOMEN: Soft, non-tender, non-distended MUSCULOSKELETAL:  Trace ankle edema, warm SKIN: Warm and dry NEUROLOGIC:  Answering questions appropriately PSYCHIATRIC:  Normal affect   ASSESSMENT:    1. Essential hypertension   2. Hyperlipidemia, unspecified hyperlipidemia type   3. Stenosis of both internal carotid arteries   4. Alzheimer's dementia, unspecified dementia severity, unspecified timing of dementia onset, unspecified whether behavioral, psychotic, or mood disturbance or anxiety (HCC)    PLAN:    In order of problems listed above:  #Hypertension: Well controlled and at goal. -Continue losartan-HCTZ 100-12.5mg  daily  #HLD: -Continue zetia 10mg   daily -Continue pravastatin 20mg  daily -LDL well controlled at 59  #Mild Carotid Disease: -Continue ASA 81mg  daily, continue zetia 10mg  daily -Continue pravastatin 40mg  daily -Stable mild disease on carotid in 10/2022; no further monitoring needed  #Alzheimer's Dementia: -Management per PCP        Follow-up:   1 year.  Medication Adjustments/Labs and Tests Ordered: Current medicines are reviewed at length with the patient today.  Concerns regarding medicines are outlined above.   No orders of the defined types were placed in this encounter.  No orders of the defined types were placed in this encounter.  Patient Instructions  Medication Instructions:  Your physician has requested that you have cardiac CT. Cardiac computed tomography (CT) is a painless test that uses an x-ray machine to take clear, detailed pictures of your heart. For further information please visit [email or web address] Please follow instruction sheet as given.   *If you need a refill on your cardiac medications before your next appointment, please call  your pharmacy*   Follow-Up: At [facility], you and your health needs are our priority.  As part of our continuing mission to provide you with exceptional heart care, we have created designated Provider Care Teams.  These Care Teams include your primary Cardiologist (physician) and Advanced Practice Providers (APPs -  Physician Assistants and Nurse Practitioners) who all work together to provide you with the care you need, when you need it.  We recommend signing up for the patient portal called "MyChart".  Sign up information is provided on this After Visit Summary.  MyChart is used to connect with patients for Virtual Visits (Telemedicine).  Patients are able to view lab/test results, encounter notes, upcoming appointments, etc.  Non-urgent messages can be sent to your provider as well.   To learn more about what you can do with MyChart, go to [email or web address].    Your next appointment:   1 year(s)  Provider:   Dr Jacques Navy       Signed, Meriam Sprague, MD  04/16/2023 2:57 PM    [facility]

## 2023-04-13 DIAGNOSIS — M62511 Muscle wasting and atrophy, not elsewhere classified, right shoulder: Secondary | ICD-10-CM | POA: Diagnosis not present

## 2023-04-13 DIAGNOSIS — M62552 Muscle wasting and atrophy, not elsewhere classified, left thigh: Secondary | ICD-10-CM | POA: Diagnosis not present

## 2023-04-13 DIAGNOSIS — R4789 Other speech disturbances: Secondary | ICD-10-CM | POA: Diagnosis not present

## 2023-04-13 DIAGNOSIS — R488 Other symbolic dysfunctions: Secondary | ICD-10-CM | POA: Diagnosis not present

## 2023-04-13 DIAGNOSIS — R41841 Cognitive communication deficit: Secondary | ICD-10-CM | POA: Diagnosis not present

## 2023-04-13 DIAGNOSIS — M62551 Muscle wasting and atrophy, not elsewhere classified, right thigh: Secondary | ICD-10-CM | POA: Diagnosis not present

## 2023-04-15 DIAGNOSIS — R488 Other symbolic dysfunctions: Secondary | ICD-10-CM | POA: Diagnosis not present

## 2023-04-15 DIAGNOSIS — M62511 Muscle wasting and atrophy, not elsewhere classified, right shoulder: Secondary | ICD-10-CM | POA: Diagnosis not present

## 2023-04-15 DIAGNOSIS — M62551 Muscle wasting and atrophy, not elsewhere classified, right thigh: Secondary | ICD-10-CM | POA: Diagnosis not present

## 2023-04-15 DIAGNOSIS — R4789 Other speech disturbances: Secondary | ICD-10-CM | POA: Diagnosis not present

## 2023-04-15 DIAGNOSIS — R41841 Cognitive communication deficit: Secondary | ICD-10-CM | POA: Diagnosis not present

## 2023-04-15 DIAGNOSIS — M62552 Muscle wasting and atrophy, not elsewhere classified, left thigh: Secondary | ICD-10-CM | POA: Diagnosis not present

## 2023-04-16 ENCOUNTER — Encounter: Payer: Self-pay | Admitting: Cardiology

## 2023-04-16 ENCOUNTER — Ambulatory Visit: Payer: Medicare Other | Attending: Cardiology | Admitting: Cardiology

## 2023-04-16 VITALS — BP 114/58 | HR 84 | Ht 62.0 in

## 2023-04-16 DIAGNOSIS — I10 Essential (primary) hypertension: Secondary | ICD-10-CM | POA: Diagnosis not present

## 2023-04-16 DIAGNOSIS — M62551 Muscle wasting and atrophy, not elsewhere classified, right thigh: Secondary | ICD-10-CM | POA: Diagnosis not present

## 2023-04-16 DIAGNOSIS — E559 Vitamin D deficiency, unspecified: Secondary | ICD-10-CM | POA: Diagnosis not present

## 2023-04-16 DIAGNOSIS — M62512 Muscle wasting and atrophy, not elsewhere classified, left shoulder: Secondary | ICD-10-CM | POA: Diagnosis not present

## 2023-04-16 DIAGNOSIS — M62511 Muscle wasting and atrophy, not elsewhere classified, right shoulder: Secondary | ICD-10-CM | POA: Diagnosis not present

## 2023-04-16 DIAGNOSIS — I6523 Occlusion and stenosis of bilateral carotid arteries: Secondary | ICD-10-CM

## 2023-04-16 DIAGNOSIS — E876 Hypokalemia: Secondary | ICD-10-CM | POA: Diagnosis not present

## 2023-04-16 DIAGNOSIS — F03B4 Unspecified dementia, moderate, with anxiety: Secondary | ICD-10-CM | POA: Diagnosis not present

## 2023-04-16 DIAGNOSIS — E782 Mixed hyperlipidemia: Secondary | ICD-10-CM | POA: Diagnosis not present

## 2023-04-16 DIAGNOSIS — F5105 Insomnia due to other mental disorder: Secondary | ICD-10-CM | POA: Diagnosis not present

## 2023-04-16 DIAGNOSIS — M62562 Muscle wasting and atrophy, not elsewhere classified, left lower leg: Secondary | ICD-10-CM | POA: Diagnosis not present

## 2023-04-16 DIAGNOSIS — R488 Other symbolic dysfunctions: Secondary | ICD-10-CM | POA: Diagnosis not present

## 2023-04-16 DIAGNOSIS — F0280 Dementia in other diseases classified elsewhere without behavioral disturbance: Secondary | ICD-10-CM

## 2023-04-16 DIAGNOSIS — E039 Hypothyroidism, unspecified: Secondary | ICD-10-CM | POA: Diagnosis not present

## 2023-04-16 DIAGNOSIS — R41841 Cognitive communication deficit: Secondary | ICD-10-CM | POA: Diagnosis not present

## 2023-04-16 DIAGNOSIS — R2689 Other abnormalities of gait and mobility: Secondary | ICD-10-CM | POA: Diagnosis not present

## 2023-04-16 DIAGNOSIS — E119 Type 2 diabetes mellitus without complications: Secondary | ICD-10-CM | POA: Diagnosis not present

## 2023-04-16 DIAGNOSIS — E785 Hyperlipidemia, unspecified: Secondary | ICD-10-CM

## 2023-04-16 DIAGNOSIS — R4789 Other speech disturbances: Secondary | ICD-10-CM | POA: Diagnosis not present

## 2023-04-16 DIAGNOSIS — M62561 Muscle wasting and atrophy, not elsewhere classified, right lower leg: Secondary | ICD-10-CM | POA: Diagnosis not present

## 2023-04-16 DIAGNOSIS — G309 Alzheimer's disease, unspecified: Secondary | ICD-10-CM | POA: Diagnosis not present

## 2023-04-16 DIAGNOSIS — M62552 Muscle wasting and atrophy, not elsewhere classified, left thigh: Secondary | ICD-10-CM | POA: Diagnosis not present

## 2023-04-16 DIAGNOSIS — D519 Vitamin B12 deficiency anemia, unspecified: Secondary | ICD-10-CM | POA: Diagnosis not present

## 2023-04-16 NOTE — Patient Instructions (Signed)
Medication Instructions:  Your physician has requested that you have cardiac CT. Cardiac computed tomography (CT) is a painless test that uses an x-ray machine to take clear, detailed pictures of your heart. For further information please visit [email or web address] Please follow instruction sheet as given.   *If you need a refill on your cardiac medications before your next appointment, please call your pharmacy*   Follow-Up: At [facility], you and your health needs are our priority.  As part of our continuing mission to provide you with exceptional heart care, we have created designated Provider Care Teams.  These Care Teams include your primary Cardiologist (physician) and Advanced Practice Providers (APPs -  Physician Assistants and Nurse Practitioners) who all work together to provide you with the care you need, when you need it.  We recommend signing up for the patient portal called "MyChart".  Sign up information is provided on this After Visit Summary.  MyChart is used to connect with patients for Virtual Visits (Telemedicine).  Patients are able to view lab/test results, encounter notes, upcoming appointments, etc.  Non-urgent messages can be sent to your provider as well.   To learn more about what you can do with MyChart, go to [email or web address].    Your next appointment:   1 year(s)  Provider:   Dr Jacques Navy

## 2023-04-17 DIAGNOSIS — M62551 Muscle wasting and atrophy, not elsewhere classified, right thigh: Secondary | ICD-10-CM | POA: Diagnosis not present

## 2023-04-17 DIAGNOSIS — R4789 Other speech disturbances: Secondary | ICD-10-CM | POA: Diagnosis not present

## 2023-04-17 DIAGNOSIS — R41841 Cognitive communication deficit: Secondary | ICD-10-CM | POA: Diagnosis not present

## 2023-04-17 DIAGNOSIS — M62511 Muscle wasting and atrophy, not elsewhere classified, right shoulder: Secondary | ICD-10-CM | POA: Diagnosis not present

## 2023-04-17 DIAGNOSIS — M62552 Muscle wasting and atrophy, not elsewhere classified, left thigh: Secondary | ICD-10-CM | POA: Diagnosis not present

## 2023-04-17 DIAGNOSIS — R488 Other symbolic dysfunctions: Secondary | ICD-10-CM | POA: Diagnosis not present

## 2023-04-18 DIAGNOSIS — M62511 Muscle wasting and atrophy, not elsewhere classified, right shoulder: Secondary | ICD-10-CM | POA: Diagnosis not present

## 2023-04-18 DIAGNOSIS — R41841 Cognitive communication deficit: Secondary | ICD-10-CM | POA: Diagnosis not present

## 2023-04-18 DIAGNOSIS — R488 Other symbolic dysfunctions: Secondary | ICD-10-CM | POA: Diagnosis not present

## 2023-04-18 DIAGNOSIS — R4789 Other speech disturbances: Secondary | ICD-10-CM | POA: Diagnosis not present

## 2023-04-18 DIAGNOSIS — M62551 Muscle wasting and atrophy, not elsewhere classified, right thigh: Secondary | ICD-10-CM | POA: Diagnosis not present

## 2023-04-18 DIAGNOSIS — M62552 Muscle wasting and atrophy, not elsewhere classified, left thigh: Secondary | ICD-10-CM | POA: Diagnosis not present

## 2023-04-19 DIAGNOSIS — M62552 Muscle wasting and atrophy, not elsewhere classified, left thigh: Secondary | ICD-10-CM | POA: Diagnosis not present

## 2023-04-19 DIAGNOSIS — M62551 Muscle wasting and atrophy, not elsewhere classified, right thigh: Secondary | ICD-10-CM | POA: Diagnosis not present

## 2023-04-19 DIAGNOSIS — M62511 Muscle wasting and atrophy, not elsewhere classified, right shoulder: Secondary | ICD-10-CM | POA: Diagnosis not present

## 2023-04-19 DIAGNOSIS — R41841 Cognitive communication deficit: Secondary | ICD-10-CM | POA: Diagnosis not present

## 2023-04-19 DIAGNOSIS — R4789 Other speech disturbances: Secondary | ICD-10-CM | POA: Diagnosis not present

## 2023-04-19 DIAGNOSIS — R488 Other symbolic dysfunctions: Secondary | ICD-10-CM | POA: Diagnosis not present

## 2023-04-23 DIAGNOSIS — M62551 Muscle wasting and atrophy, not elsewhere classified, right thigh: Secondary | ICD-10-CM | POA: Diagnosis not present

## 2023-04-23 DIAGNOSIS — R41841 Cognitive communication deficit: Secondary | ICD-10-CM | POA: Diagnosis not present

## 2023-04-23 DIAGNOSIS — M62552 Muscle wasting and atrophy, not elsewhere classified, left thigh: Secondary | ICD-10-CM | POA: Diagnosis not present

## 2023-04-23 DIAGNOSIS — M62511 Muscle wasting and atrophy, not elsewhere classified, right shoulder: Secondary | ICD-10-CM | POA: Diagnosis not present

## 2023-04-23 DIAGNOSIS — R488 Other symbolic dysfunctions: Secondary | ICD-10-CM | POA: Diagnosis not present

## 2023-04-23 DIAGNOSIS — R4789 Other speech disturbances: Secondary | ICD-10-CM | POA: Diagnosis not present

## 2023-04-26 DIAGNOSIS — R488 Other symbolic dysfunctions: Secondary | ICD-10-CM | POA: Diagnosis not present

## 2023-04-26 DIAGNOSIS — R41841 Cognitive communication deficit: Secondary | ICD-10-CM | POA: Diagnosis not present

## 2023-04-26 DIAGNOSIS — M62552 Muscle wasting and atrophy, not elsewhere classified, left thigh: Secondary | ICD-10-CM | POA: Diagnosis not present

## 2023-04-26 DIAGNOSIS — R4789 Other speech disturbances: Secondary | ICD-10-CM | POA: Diagnosis not present

## 2023-04-26 DIAGNOSIS — M62551 Muscle wasting and atrophy, not elsewhere classified, right thigh: Secondary | ICD-10-CM | POA: Diagnosis not present

## 2023-04-26 DIAGNOSIS — M62511 Muscle wasting and atrophy, not elsewhere classified, right shoulder: Secondary | ICD-10-CM | POA: Diagnosis not present

## 2023-04-27 DIAGNOSIS — R4789 Other speech disturbances: Secondary | ICD-10-CM | POA: Diagnosis not present

## 2023-04-27 DIAGNOSIS — R41841 Cognitive communication deficit: Secondary | ICD-10-CM | POA: Diagnosis not present

## 2023-04-27 DIAGNOSIS — M62551 Muscle wasting and atrophy, not elsewhere classified, right thigh: Secondary | ICD-10-CM | POA: Diagnosis not present

## 2023-04-27 DIAGNOSIS — M62511 Muscle wasting and atrophy, not elsewhere classified, right shoulder: Secondary | ICD-10-CM | POA: Diagnosis not present

## 2023-04-27 DIAGNOSIS — R488 Other symbolic dysfunctions: Secondary | ICD-10-CM | POA: Diagnosis not present

## 2023-04-27 DIAGNOSIS — M62552 Muscle wasting and atrophy, not elsewhere classified, left thigh: Secondary | ICD-10-CM | POA: Diagnosis not present

## 2023-04-30 DIAGNOSIS — R4789 Other speech disturbances: Secondary | ICD-10-CM | POA: Diagnosis not present

## 2023-04-30 DIAGNOSIS — R488 Other symbolic dysfunctions: Secondary | ICD-10-CM | POA: Diagnosis not present

## 2023-04-30 DIAGNOSIS — R41841 Cognitive communication deficit: Secondary | ICD-10-CM | POA: Diagnosis not present

## 2023-04-30 DIAGNOSIS — M62511 Muscle wasting and atrophy, not elsewhere classified, right shoulder: Secondary | ICD-10-CM | POA: Diagnosis not present

## 2023-04-30 DIAGNOSIS — M62551 Muscle wasting and atrophy, not elsewhere classified, right thigh: Secondary | ICD-10-CM | POA: Diagnosis not present

## 2023-04-30 DIAGNOSIS — M62552 Muscle wasting and atrophy, not elsewhere classified, left thigh: Secondary | ICD-10-CM | POA: Diagnosis not present

## 2023-05-01 DIAGNOSIS — M62551 Muscle wasting and atrophy, not elsewhere classified, right thigh: Secondary | ICD-10-CM | POA: Diagnosis not present

## 2023-05-01 DIAGNOSIS — M62552 Muscle wasting and atrophy, not elsewhere classified, left thigh: Secondary | ICD-10-CM | POA: Diagnosis not present

## 2023-05-01 DIAGNOSIS — R41841 Cognitive communication deficit: Secondary | ICD-10-CM | POA: Diagnosis not present

## 2023-05-01 DIAGNOSIS — R488 Other symbolic dysfunctions: Secondary | ICD-10-CM | POA: Diagnosis not present

## 2023-05-01 DIAGNOSIS — R4789 Other speech disturbances: Secondary | ICD-10-CM | POA: Diagnosis not present

## 2023-05-01 DIAGNOSIS — M62511 Muscle wasting and atrophy, not elsewhere classified, right shoulder: Secondary | ICD-10-CM | POA: Diagnosis not present

## 2023-05-02 DIAGNOSIS — M62551 Muscle wasting and atrophy, not elsewhere classified, right thigh: Secondary | ICD-10-CM | POA: Diagnosis not present

## 2023-05-02 DIAGNOSIS — M62552 Muscle wasting and atrophy, not elsewhere classified, left thigh: Secondary | ICD-10-CM | POA: Diagnosis not present

## 2023-05-02 DIAGNOSIS — R4789 Other speech disturbances: Secondary | ICD-10-CM | POA: Diagnosis not present

## 2023-05-02 DIAGNOSIS — R488 Other symbolic dysfunctions: Secondary | ICD-10-CM | POA: Diagnosis not present

## 2023-05-02 DIAGNOSIS — M62511 Muscle wasting and atrophy, not elsewhere classified, right shoulder: Secondary | ICD-10-CM | POA: Diagnosis not present

## 2023-05-02 DIAGNOSIS — R41841 Cognitive communication deficit: Secondary | ICD-10-CM | POA: Diagnosis not present

## 2023-05-04 DIAGNOSIS — M62552 Muscle wasting and atrophy, not elsewhere classified, left thigh: Secondary | ICD-10-CM | POA: Diagnosis not present

## 2023-05-04 DIAGNOSIS — M62551 Muscle wasting and atrophy, not elsewhere classified, right thigh: Secondary | ICD-10-CM | POA: Diagnosis not present

## 2023-05-04 DIAGNOSIS — R41841 Cognitive communication deficit: Secondary | ICD-10-CM | POA: Diagnosis not present

## 2023-05-04 DIAGNOSIS — M62511 Muscle wasting and atrophy, not elsewhere classified, right shoulder: Secondary | ICD-10-CM | POA: Diagnosis not present

## 2023-05-04 DIAGNOSIS — R4789 Other speech disturbances: Secondary | ICD-10-CM | POA: Diagnosis not present

## 2023-05-04 DIAGNOSIS — R488 Other symbolic dysfunctions: Secondary | ICD-10-CM | POA: Diagnosis not present

## 2023-05-07 DIAGNOSIS — M62511 Muscle wasting and atrophy, not elsewhere classified, right shoulder: Secondary | ICD-10-CM | POA: Diagnosis not present

## 2023-05-07 DIAGNOSIS — R41841 Cognitive communication deficit: Secondary | ICD-10-CM | POA: Diagnosis not present

## 2023-05-07 DIAGNOSIS — R4789 Other speech disturbances: Secondary | ICD-10-CM | POA: Diagnosis not present

## 2023-05-07 DIAGNOSIS — R488 Other symbolic dysfunctions: Secondary | ICD-10-CM | POA: Diagnosis not present

## 2023-05-07 DIAGNOSIS — M62552 Muscle wasting and atrophy, not elsewhere classified, left thigh: Secondary | ICD-10-CM | POA: Diagnosis not present

## 2023-05-07 DIAGNOSIS — M62551 Muscle wasting and atrophy, not elsewhere classified, right thigh: Secondary | ICD-10-CM | POA: Diagnosis not present

## 2023-05-08 DIAGNOSIS — F0390 Unspecified dementia without behavioral disturbance: Secondary | ICD-10-CM | POA: Diagnosis not present

## 2023-05-08 DIAGNOSIS — F5105 Insomnia due to other mental disorder: Secondary | ICD-10-CM | POA: Diagnosis not present

## 2023-05-08 DIAGNOSIS — F331 Major depressive disorder, recurrent, moderate: Secondary | ICD-10-CM | POA: Diagnosis not present

## 2023-05-08 DIAGNOSIS — R41841 Cognitive communication deficit: Secondary | ICD-10-CM | POA: Diagnosis not present

## 2023-05-08 DIAGNOSIS — M62551 Muscle wasting and atrophy, not elsewhere classified, right thigh: Secondary | ICD-10-CM | POA: Diagnosis not present

## 2023-05-08 DIAGNOSIS — R488 Other symbolic dysfunctions: Secondary | ICD-10-CM | POA: Diagnosis not present

## 2023-05-08 DIAGNOSIS — R4789 Other speech disturbances: Secondary | ICD-10-CM | POA: Diagnosis not present

## 2023-05-08 DIAGNOSIS — M62511 Muscle wasting and atrophy, not elsewhere classified, right shoulder: Secondary | ICD-10-CM | POA: Diagnosis not present

## 2023-05-08 DIAGNOSIS — F411 Generalized anxiety disorder: Secondary | ICD-10-CM | POA: Diagnosis not present

## 2023-05-08 DIAGNOSIS — M62552 Muscle wasting and atrophy, not elsewhere classified, left thigh: Secondary | ICD-10-CM | POA: Diagnosis not present

## 2023-05-09 DIAGNOSIS — R41841 Cognitive communication deficit: Secondary | ICD-10-CM | POA: Diagnosis not present

## 2023-05-09 DIAGNOSIS — M62551 Muscle wasting and atrophy, not elsewhere classified, right thigh: Secondary | ICD-10-CM | POA: Diagnosis not present

## 2023-05-09 DIAGNOSIS — R4789 Other speech disturbances: Secondary | ICD-10-CM | POA: Diagnosis not present

## 2023-05-09 DIAGNOSIS — R488 Other symbolic dysfunctions: Secondary | ICD-10-CM | POA: Diagnosis not present

## 2023-05-09 DIAGNOSIS — M62552 Muscle wasting and atrophy, not elsewhere classified, left thigh: Secondary | ICD-10-CM | POA: Diagnosis not present

## 2023-05-09 DIAGNOSIS — M62511 Muscle wasting and atrophy, not elsewhere classified, right shoulder: Secondary | ICD-10-CM | POA: Diagnosis not present

## 2023-05-10 DIAGNOSIS — R488 Other symbolic dysfunctions: Secondary | ICD-10-CM | POA: Diagnosis not present

## 2023-05-10 DIAGNOSIS — M62551 Muscle wasting and atrophy, not elsewhere classified, right thigh: Secondary | ICD-10-CM | POA: Diagnosis not present

## 2023-05-10 DIAGNOSIS — M62552 Muscle wasting and atrophy, not elsewhere classified, left thigh: Secondary | ICD-10-CM | POA: Diagnosis not present

## 2023-05-10 DIAGNOSIS — R4789 Other speech disturbances: Secondary | ICD-10-CM | POA: Diagnosis not present

## 2023-05-10 DIAGNOSIS — R41841 Cognitive communication deficit: Secondary | ICD-10-CM | POA: Diagnosis not present

## 2023-05-10 DIAGNOSIS — M62511 Muscle wasting and atrophy, not elsewhere classified, right shoulder: Secondary | ICD-10-CM | POA: Diagnosis not present

## 2023-05-11 DIAGNOSIS — R488 Other symbolic dysfunctions: Secondary | ICD-10-CM | POA: Diagnosis not present

## 2023-05-11 DIAGNOSIS — R41841 Cognitive communication deficit: Secondary | ICD-10-CM | POA: Diagnosis not present

## 2023-05-11 DIAGNOSIS — M62511 Muscle wasting and atrophy, not elsewhere classified, right shoulder: Secondary | ICD-10-CM | POA: Diagnosis not present

## 2023-05-11 DIAGNOSIS — M62551 Muscle wasting and atrophy, not elsewhere classified, right thigh: Secondary | ICD-10-CM | POA: Diagnosis not present

## 2023-05-11 DIAGNOSIS — R4789 Other speech disturbances: Secondary | ICD-10-CM | POA: Diagnosis not present

## 2023-05-11 DIAGNOSIS — M62552 Muscle wasting and atrophy, not elsewhere classified, left thigh: Secondary | ICD-10-CM | POA: Diagnosis not present

## 2023-05-14 DIAGNOSIS — M62552 Muscle wasting and atrophy, not elsewhere classified, left thigh: Secondary | ICD-10-CM | POA: Diagnosis not present

## 2023-05-14 DIAGNOSIS — R4789 Other speech disturbances: Secondary | ICD-10-CM | POA: Diagnosis not present

## 2023-05-14 DIAGNOSIS — R41841 Cognitive communication deficit: Secondary | ICD-10-CM | POA: Diagnosis not present

## 2023-05-14 DIAGNOSIS — M62511 Muscle wasting and atrophy, not elsewhere classified, right shoulder: Secondary | ICD-10-CM | POA: Diagnosis not present

## 2023-05-14 DIAGNOSIS — R488 Other symbolic dysfunctions: Secondary | ICD-10-CM | POA: Diagnosis not present

## 2023-05-14 DIAGNOSIS — M62551 Muscle wasting and atrophy, not elsewhere classified, right thigh: Secondary | ICD-10-CM | POA: Diagnosis not present

## 2023-05-15 DIAGNOSIS — I10 Essential (primary) hypertension: Secondary | ICD-10-CM | POA: Diagnosis not present

## 2023-05-15 DIAGNOSIS — E785 Hyperlipidemia, unspecified: Secondary | ICD-10-CM | POA: Diagnosis not present

## 2023-05-16 DIAGNOSIS — R41841 Cognitive communication deficit: Secondary | ICD-10-CM | POA: Diagnosis not present

## 2023-05-16 DIAGNOSIS — M62552 Muscle wasting and atrophy, not elsewhere classified, left thigh: Secondary | ICD-10-CM | POA: Diagnosis not present

## 2023-05-16 DIAGNOSIS — R488 Other symbolic dysfunctions: Secondary | ICD-10-CM | POA: Diagnosis not present

## 2023-05-16 DIAGNOSIS — M62511 Muscle wasting and atrophy, not elsewhere classified, right shoulder: Secondary | ICD-10-CM | POA: Diagnosis not present

## 2023-05-16 DIAGNOSIS — R4789 Other speech disturbances: Secondary | ICD-10-CM | POA: Diagnosis not present

## 2023-05-16 DIAGNOSIS — M62551 Muscle wasting and atrophy, not elsewhere classified, right thigh: Secondary | ICD-10-CM | POA: Diagnosis not present

## 2023-05-17 DIAGNOSIS — R488 Other symbolic dysfunctions: Secondary | ICD-10-CM | POA: Diagnosis not present

## 2023-05-17 DIAGNOSIS — M62561 Muscle wasting and atrophy, not elsewhere classified, right lower leg: Secondary | ICD-10-CM | POA: Diagnosis not present

## 2023-05-17 DIAGNOSIS — M62511 Muscle wasting and atrophy, not elsewhere classified, right shoulder: Secondary | ICD-10-CM | POA: Diagnosis not present

## 2023-05-17 DIAGNOSIS — M62551 Muscle wasting and atrophy, not elsewhere classified, right thigh: Secondary | ICD-10-CM | POA: Diagnosis not present

## 2023-05-17 DIAGNOSIS — M62552 Muscle wasting and atrophy, not elsewhere classified, left thigh: Secondary | ICD-10-CM | POA: Diagnosis not present

## 2023-05-17 DIAGNOSIS — R2689 Other abnormalities of gait and mobility: Secondary | ICD-10-CM | POA: Diagnosis not present

## 2023-05-17 DIAGNOSIS — M62512 Muscle wasting and atrophy, not elsewhere classified, left shoulder: Secondary | ICD-10-CM | POA: Diagnosis not present

## 2023-05-17 DIAGNOSIS — M62562 Muscle wasting and atrophy, not elsewhere classified, left lower leg: Secondary | ICD-10-CM | POA: Diagnosis not present

## 2023-05-18 DIAGNOSIS — M62561 Muscle wasting and atrophy, not elsewhere classified, right lower leg: Secondary | ICD-10-CM | POA: Diagnosis not present

## 2023-05-18 DIAGNOSIS — R488 Other symbolic dysfunctions: Secondary | ICD-10-CM | POA: Diagnosis not present

## 2023-05-18 DIAGNOSIS — M62551 Muscle wasting and atrophy, not elsewhere classified, right thigh: Secondary | ICD-10-CM | POA: Diagnosis not present

## 2023-05-18 DIAGNOSIS — M62552 Muscle wasting and atrophy, not elsewhere classified, left thigh: Secondary | ICD-10-CM | POA: Diagnosis not present

## 2023-05-18 DIAGNOSIS — M62512 Muscle wasting and atrophy, not elsewhere classified, left shoulder: Secondary | ICD-10-CM | POA: Diagnosis not present

## 2023-05-18 DIAGNOSIS — M62511 Muscle wasting and atrophy, not elsewhere classified, right shoulder: Secondary | ICD-10-CM | POA: Diagnosis not present

## 2023-05-21 DIAGNOSIS — M62552 Muscle wasting and atrophy, not elsewhere classified, left thigh: Secondary | ICD-10-CM | POA: Diagnosis not present

## 2023-05-21 DIAGNOSIS — M62561 Muscle wasting and atrophy, not elsewhere classified, right lower leg: Secondary | ICD-10-CM | POA: Diagnosis not present

## 2023-05-21 DIAGNOSIS — M62551 Muscle wasting and atrophy, not elsewhere classified, right thigh: Secondary | ICD-10-CM | POA: Diagnosis not present

## 2023-05-21 DIAGNOSIS — R488 Other symbolic dysfunctions: Secondary | ICD-10-CM | POA: Diagnosis not present

## 2023-05-21 DIAGNOSIS — M62511 Muscle wasting and atrophy, not elsewhere classified, right shoulder: Secondary | ICD-10-CM | POA: Diagnosis not present

## 2023-05-21 DIAGNOSIS — M62512 Muscle wasting and atrophy, not elsewhere classified, left shoulder: Secondary | ICD-10-CM | POA: Diagnosis not present

## 2023-05-22 DIAGNOSIS — D519 Vitamin B12 deficiency anemia, unspecified: Secondary | ICD-10-CM | POA: Diagnosis not present

## 2023-05-22 DIAGNOSIS — E039 Hypothyroidism, unspecified: Secondary | ICD-10-CM | POA: Diagnosis not present

## 2023-05-22 DIAGNOSIS — E782 Mixed hyperlipidemia: Secondary | ICD-10-CM | POA: Diagnosis not present

## 2023-05-22 DIAGNOSIS — G4700 Insomnia, unspecified: Secondary | ICD-10-CM | POA: Diagnosis not present

## 2023-05-22 DIAGNOSIS — F03B4 Unspecified dementia, moderate, with anxiety: Secondary | ICD-10-CM | POA: Diagnosis not present

## 2023-05-22 DIAGNOSIS — E876 Hypokalemia: Secondary | ICD-10-CM | POA: Diagnosis not present

## 2023-05-22 DIAGNOSIS — E119 Type 2 diabetes mellitus without complications: Secondary | ICD-10-CM | POA: Diagnosis not present

## 2023-05-22 DIAGNOSIS — E559 Vitamin D deficiency, unspecified: Secondary | ICD-10-CM | POA: Diagnosis not present

## 2023-05-22 DIAGNOSIS — I10 Essential (primary) hypertension: Secondary | ICD-10-CM | POA: Diagnosis not present

## 2023-05-23 DIAGNOSIS — R488 Other symbolic dysfunctions: Secondary | ICD-10-CM | POA: Diagnosis not present

## 2023-05-23 DIAGNOSIS — M62561 Muscle wasting and atrophy, not elsewhere classified, right lower leg: Secondary | ICD-10-CM | POA: Diagnosis not present

## 2023-05-23 DIAGNOSIS — M62511 Muscle wasting and atrophy, not elsewhere classified, right shoulder: Secondary | ICD-10-CM | POA: Diagnosis not present

## 2023-05-23 DIAGNOSIS — M62551 Muscle wasting and atrophy, not elsewhere classified, right thigh: Secondary | ICD-10-CM | POA: Diagnosis not present

## 2023-05-23 DIAGNOSIS — M62552 Muscle wasting and atrophy, not elsewhere classified, left thigh: Secondary | ICD-10-CM | POA: Diagnosis not present

## 2023-05-23 DIAGNOSIS — M62512 Muscle wasting and atrophy, not elsewhere classified, left shoulder: Secondary | ICD-10-CM | POA: Diagnosis not present

## 2023-05-24 DIAGNOSIS — M62552 Muscle wasting and atrophy, not elsewhere classified, left thigh: Secondary | ICD-10-CM | POA: Diagnosis not present

## 2023-05-24 DIAGNOSIS — R488 Other symbolic dysfunctions: Secondary | ICD-10-CM | POA: Diagnosis not present

## 2023-05-24 DIAGNOSIS — M62512 Muscle wasting and atrophy, not elsewhere classified, left shoulder: Secondary | ICD-10-CM | POA: Diagnosis not present

## 2023-05-24 DIAGNOSIS — M62551 Muscle wasting and atrophy, not elsewhere classified, right thigh: Secondary | ICD-10-CM | POA: Diagnosis not present

## 2023-05-24 DIAGNOSIS — M62561 Muscle wasting and atrophy, not elsewhere classified, right lower leg: Secondary | ICD-10-CM | POA: Diagnosis not present

## 2023-05-24 DIAGNOSIS — M62511 Muscle wasting and atrophy, not elsewhere classified, right shoulder: Secondary | ICD-10-CM | POA: Diagnosis not present

## 2023-05-25 DIAGNOSIS — M62551 Muscle wasting and atrophy, not elsewhere classified, right thigh: Secondary | ICD-10-CM | POA: Diagnosis not present

## 2023-05-25 DIAGNOSIS — M62552 Muscle wasting and atrophy, not elsewhere classified, left thigh: Secondary | ICD-10-CM | POA: Diagnosis not present

## 2023-05-25 DIAGNOSIS — R488 Other symbolic dysfunctions: Secondary | ICD-10-CM | POA: Diagnosis not present

## 2023-05-25 DIAGNOSIS — M62512 Muscle wasting and atrophy, not elsewhere classified, left shoulder: Secondary | ICD-10-CM | POA: Diagnosis not present

## 2023-05-25 DIAGNOSIS — M62511 Muscle wasting and atrophy, not elsewhere classified, right shoulder: Secondary | ICD-10-CM | POA: Diagnosis not present

## 2023-05-25 DIAGNOSIS — M62561 Muscle wasting and atrophy, not elsewhere classified, right lower leg: Secondary | ICD-10-CM | POA: Diagnosis not present

## 2023-05-28 DIAGNOSIS — M62552 Muscle wasting and atrophy, not elsewhere classified, left thigh: Secondary | ICD-10-CM | POA: Diagnosis not present

## 2023-05-28 DIAGNOSIS — R488 Other symbolic dysfunctions: Secondary | ICD-10-CM | POA: Diagnosis not present

## 2023-05-28 DIAGNOSIS — M62551 Muscle wasting and atrophy, not elsewhere classified, right thigh: Secondary | ICD-10-CM | POA: Diagnosis not present

## 2023-05-28 DIAGNOSIS — M62561 Muscle wasting and atrophy, not elsewhere classified, right lower leg: Secondary | ICD-10-CM | POA: Diagnosis not present

## 2023-05-28 DIAGNOSIS — M62511 Muscle wasting and atrophy, not elsewhere classified, right shoulder: Secondary | ICD-10-CM | POA: Diagnosis not present

## 2023-05-28 DIAGNOSIS — M62512 Muscle wasting and atrophy, not elsewhere classified, left shoulder: Secondary | ICD-10-CM | POA: Diagnosis not present

## 2023-05-29 DIAGNOSIS — M62561 Muscle wasting and atrophy, not elsewhere classified, right lower leg: Secondary | ICD-10-CM | POA: Diagnosis not present

## 2023-05-29 DIAGNOSIS — M62552 Muscle wasting and atrophy, not elsewhere classified, left thigh: Secondary | ICD-10-CM | POA: Diagnosis not present

## 2023-05-29 DIAGNOSIS — M62511 Muscle wasting and atrophy, not elsewhere classified, right shoulder: Secondary | ICD-10-CM | POA: Diagnosis not present

## 2023-05-29 DIAGNOSIS — M62512 Muscle wasting and atrophy, not elsewhere classified, left shoulder: Secondary | ICD-10-CM | POA: Diagnosis not present

## 2023-05-29 DIAGNOSIS — R488 Other symbolic dysfunctions: Secondary | ICD-10-CM | POA: Diagnosis not present

## 2023-05-29 DIAGNOSIS — M62551 Muscle wasting and atrophy, not elsewhere classified, right thigh: Secondary | ICD-10-CM | POA: Diagnosis not present

## 2023-05-30 DIAGNOSIS — M62551 Muscle wasting and atrophy, not elsewhere classified, right thigh: Secondary | ICD-10-CM | POA: Diagnosis not present

## 2023-05-30 DIAGNOSIS — R488 Other symbolic dysfunctions: Secondary | ICD-10-CM | POA: Diagnosis not present

## 2023-05-30 DIAGNOSIS — M62552 Muscle wasting and atrophy, not elsewhere classified, left thigh: Secondary | ICD-10-CM | POA: Diagnosis not present

## 2023-05-30 DIAGNOSIS — M62512 Muscle wasting and atrophy, not elsewhere classified, left shoulder: Secondary | ICD-10-CM | POA: Diagnosis not present

## 2023-05-30 DIAGNOSIS — M62511 Muscle wasting and atrophy, not elsewhere classified, right shoulder: Secondary | ICD-10-CM | POA: Diagnosis not present

## 2023-05-30 DIAGNOSIS — M62561 Muscle wasting and atrophy, not elsewhere classified, right lower leg: Secondary | ICD-10-CM | POA: Diagnosis not present

## 2023-05-31 DIAGNOSIS — M62552 Muscle wasting and atrophy, not elsewhere classified, left thigh: Secondary | ICD-10-CM | POA: Diagnosis not present

## 2023-05-31 DIAGNOSIS — M62551 Muscle wasting and atrophy, not elsewhere classified, right thigh: Secondary | ICD-10-CM | POA: Diagnosis not present

## 2023-05-31 DIAGNOSIS — M62561 Muscle wasting and atrophy, not elsewhere classified, right lower leg: Secondary | ICD-10-CM | POA: Diagnosis not present

## 2023-05-31 DIAGNOSIS — R488 Other symbolic dysfunctions: Secondary | ICD-10-CM | POA: Diagnosis not present

## 2023-05-31 DIAGNOSIS — M62511 Muscle wasting and atrophy, not elsewhere classified, right shoulder: Secondary | ICD-10-CM | POA: Diagnosis not present

## 2023-05-31 DIAGNOSIS — M62512 Muscle wasting and atrophy, not elsewhere classified, left shoulder: Secondary | ICD-10-CM | POA: Diagnosis not present

## 2023-06-01 DIAGNOSIS — M62551 Muscle wasting and atrophy, not elsewhere classified, right thigh: Secondary | ICD-10-CM | POA: Diagnosis not present

## 2023-06-01 DIAGNOSIS — R488 Other symbolic dysfunctions: Secondary | ICD-10-CM | POA: Diagnosis not present

## 2023-06-01 DIAGNOSIS — M62512 Muscle wasting and atrophy, not elsewhere classified, left shoulder: Secondary | ICD-10-CM | POA: Diagnosis not present

## 2023-06-01 DIAGNOSIS — M62561 Muscle wasting and atrophy, not elsewhere classified, right lower leg: Secondary | ICD-10-CM | POA: Diagnosis not present

## 2023-06-01 DIAGNOSIS — M62552 Muscle wasting and atrophy, not elsewhere classified, left thigh: Secondary | ICD-10-CM | POA: Diagnosis not present

## 2023-06-01 DIAGNOSIS — M62511 Muscle wasting and atrophy, not elsewhere classified, right shoulder: Secondary | ICD-10-CM | POA: Diagnosis not present

## 2023-06-02 ENCOUNTER — Emergency Department (HOSPITAL_COMMUNITY)
Admission: EM | Admit: 2023-06-02 | Discharge: 2023-06-02 | Disposition: A | Discharge status: Home / Self Care | Payer: Medicare Other | Attending: Emergency Medicine | Admitting: Emergency Medicine

## 2023-06-02 ENCOUNTER — Emergency Department (HOSPITAL_COMMUNITY): Payer: Medicare Other

## 2023-06-02 ENCOUNTER — Other Ambulatory Visit: Payer: Self-pay

## 2023-06-02 DIAGNOSIS — W010XXA Fall on same level from slipping, tripping and stumbling without subsequent striking against object, initial encounter: Secondary | ICD-10-CM | POA: Diagnosis not present

## 2023-06-02 DIAGNOSIS — S0990XA Unspecified injury of head, initial encounter: Secondary | ICD-10-CM | POA: Diagnosis not present

## 2023-06-02 DIAGNOSIS — W19XXXA Unspecified fall, initial encounter: Secondary | ICD-10-CM | POA: Diagnosis not present

## 2023-06-02 DIAGNOSIS — Z96643 Presence of artificial hip joint, bilateral: Secondary | ICD-10-CM | POA: Diagnosis not present

## 2023-06-02 DIAGNOSIS — R609 Edema, unspecified: Secondary | ICD-10-CM | POA: Diagnosis not present

## 2023-06-02 DIAGNOSIS — M1712 Unilateral primary osteoarthritis, left knee: Secondary | ICD-10-CM | POA: Diagnosis not present

## 2023-06-02 DIAGNOSIS — M25519 Pain in unspecified shoulder: Secondary | ICD-10-CM | POA: Diagnosis not present

## 2023-06-02 DIAGNOSIS — M542 Cervicalgia: Secondary | ICD-10-CM | POA: Diagnosis present

## 2023-06-02 DIAGNOSIS — Y92002 Bathroom of unspecified non-institutional (private) residence single-family (private) house as the place of occurrence of the external cause: Secondary | ICD-10-CM | POA: Diagnosis not present

## 2023-06-02 DIAGNOSIS — M19012 Primary osteoarthritis, left shoulder: Secondary | ICD-10-CM | POA: Diagnosis not present

## 2023-06-02 DIAGNOSIS — I10 Essential (primary) hypertension: Secondary | ICD-10-CM | POA: Insufficient documentation

## 2023-06-02 DIAGNOSIS — I6782 Cerebral ischemia: Secondary | ICD-10-CM | POA: Diagnosis not present

## 2023-06-02 DIAGNOSIS — M25512 Pain in left shoulder: Secondary | ICD-10-CM

## 2023-06-02 DIAGNOSIS — M25552 Pain in left hip: Secondary | ICD-10-CM | POA: Diagnosis not present

## 2023-06-02 DIAGNOSIS — Z471 Aftercare following joint replacement surgery: Secondary | ICD-10-CM | POA: Diagnosis not present

## 2023-06-02 DIAGNOSIS — S161XXA Strain of muscle, fascia and tendon at neck level, initial encounter: Secondary | ICD-10-CM | POA: Diagnosis not present

## 2023-06-02 DIAGNOSIS — E041 Nontoxic single thyroid nodule: Secondary | ICD-10-CM | POA: Diagnosis not present

## 2023-06-02 DIAGNOSIS — R58 Hemorrhage, not elsewhere classified: Secondary | ICD-10-CM | POA: Diagnosis not present

## 2023-06-02 DIAGNOSIS — M25562 Pain in left knee: Secondary | ICD-10-CM | POA: Diagnosis not present

## 2023-06-02 DIAGNOSIS — M7652 Patellar tendinitis, left knee: Secondary | ICD-10-CM | POA: Diagnosis not present

## 2023-06-02 DIAGNOSIS — S199XXA Unspecified injury of neck, initial encounter: Secondary | ICD-10-CM | POA: Diagnosis not present

## 2023-06-02 MED ORDER — ACETAMINOPHEN 500 MG PO TABS
1000.0000 mg | ORAL_TABLET | Freq: Once | ORAL | Status: AC
  Administered 2023-06-02: 1000 mg via ORAL
  Filled 2023-06-02: qty 2

## 2023-06-02 NOTE — ED Notes (Signed)
Patient transported to X-ray 

## 2023-06-02 NOTE — ED Triage Notes (Signed)
Pt BIBA for fall in bathroom. Pt landed on left side with complaints of hip/shoulder pain, c collar applied  Denies n/v/d syncope Denies head injury No deformity noted.  Vitals from EMS 116/68 86 97% Cbg 104 A&o x 1

## 2023-06-02 NOTE — ED Provider Notes (Signed)
Emergency Department Provider Note   I have reviewed the triage vital signs and the nursing notes.   HISTORY  Chief Complaint Fall (Left hip and shoulder )   HPI Felicia Kelly is a 87 y.o. female with past history reviewed below presents to the emergency department by EMS from [facility] nursing facility.  She had a mechanical fall there this morning.  Patient states she was walking when she slipped and fell landing on her left side.  She is having pain in her left leg, left shoulder, back of her neck.  No loss of consciousness.  No syncope or presyncope prior to fall.  Denies shortness of breath or abdominal pain.  No numbness or weakness symptoms.   Past Medical History:  Diagnosis Date   Aortic atherosclerosis (HCC)    a. noted on CT 01/2018.   Arthritis    Colonic polyp    Coronary atherosclerosis    a. noted on CT 01/2018.   Genital herpes    Hyperlipidemia    Hypertension    Mild carotid artery disease (HCC)    Osteoporosis    Overdose of cardiac medication    Pulmonary nodules    a. followed by PCP by serial Ct.   Thyroid disease    seeing Dr. Sharl Ma   Tremor     Review of Systems  Constitutional: No fever/chills Cardiovascular: Denies chest pain. Respiratory: Denies shortness of breath. Gastrointestinal: No abdominal pain.  No nausea, no vomiting.  No diarrhea.  No constipation. Genitourinary: Negative for dysuria. Musculoskeletal: Positive neck pain. Positive left shoulder and left leg pain.  Skin: Negative for rash. Neurological: Negative for headaches, focal weakness or numbness.   ____________________________________________   PHYSICAL EXAM:  VITAL SIGNS: ED Triage Vitals  Encounter Vitals Group     BP 06/02/23 0903 130/71     Pulse Rate 06/02/23 0903 66     Resp 06/02/23 0903 19     Temp 06/02/23 0903 (!) 97.4 F (36.3 C)     Temp Source 06/02/23 0903 Oral     SpO2 06/02/23 0903 98 %     Weight 06/02/23 0909 141 lb 1.5 oz (64 kg)      Height 06/02/23 0909 5\' 2"  (1.575 m)   Constitutional: Alert and oriented. Well appearing and in no acute distress. Eyes: Conjunctivae are normal.  Head: Atraumatic. Nose: No congestion/rhinnorhea. Mouth/Throat: Mucous membranes are moist. Neck: No stridor. C collar in place.  Cardiovascular: Normal rate, regular rhythm. Good peripheral circulation. Grossly normal heart sounds.   Respiratory: Normal respiratory effort.  No retractions. Lungs CTAB. Gastrointestinal: Soft and nontender. No distention.  Musculoskeletal: Normal range of motion of the bilateral hips both active and passive.  She does have some subjective pain complaint in the left hip as well as tenderness to the left lateral knee.  No large effusion or ecchymosis.  Compartments are soft.  No ankle tenderness with preserved range of motion bilaterally.  Normal range of motion of the left shoulder with some crepitus.  The joint appears clinically located.   Neurologic:  Normal speech and language. No gross focal neurologic deficits are appreciated.  Skin:  Skin is warm, dry and intact. No rash noted.  ____________________________________________  RADIOLOGY  No results found.  ____________________________________________   PROCEDURES  Procedure(s) performed:   Procedures   ____________________________________________   INITIAL IMPRESSION / ASSESSMENT AND PLAN / ED COURSE  Pertinent labs & imaging results that were available during my care of the patient were  reviewed by me and considered in my medical decision making (see chart for details).   This patient is Presenting for Evaluation of head injury, which does require a range of treatment options, and is a complaint that involves a high risk of morbidity and mortality.  The Differential Diagnoses includes subdural hematoma, epidural hematoma, acute concussion, traumatic subarachnoid hemorrhage, cerebral contusions, etc.   Critical Interventions-     Medications  acetaminophen (TYLENOL) tablet 1,000 mg (1,000 mg Oral Given 06/02/23 0925)    Reassessment after intervention:     I did obtain Additional Historical Information from EMS.     Clinical Laboratory Tests: Considered labs but patient with what appears to be a mechanical fall. Feeling well. Normal vital on arrival. Defer labs for now.   Radiologic Tests Ordered, included CT head, c spine, XR imaging. I independently interpreted the images and agree with radiology interpretation.   Cardiac Monitor Tracing which shows NSR.    Social Determinants of Health Risk patient is not a smoker.   Consult complete with  Medical Decision Making: Summary:  Presents to the emergency department for evaluation of pain after fall. Plan for trauma imaging and reassess.   Reevaluation with update and discussion with   ***Considered admission***  Patient's presentation is most consistent with acute presentation with potential threat to life or bodily function.   Disposition:   ____________________________________________  FINAL CLINICAL IMPRESSION(S) / ED DIAGNOSES  Final diagnoses:  None     NEW OUTPATIENT MEDICATIONS STARTED DURING THIS VISIT:  New Prescriptions   No medications on file    Note:  This document was prepared using Dragon voice recognition software and may include unintentional dictation errors.  Alona Bene, MD, [facility]

## 2023-06-02 NOTE — ED Notes (Signed)
Pt was discharged with Sister and POA Thedora Hinders to be transported back to [facility]

## 2023-06-02 NOTE — Discharge Instructions (Signed)
You were seen in the emergency department today after a fall.  Your CT scans and x-rays are reassuring with no broken bones or bleeding.  You do have some swelling in your feet and should keep them elevated and wrapped either with Ace bandages or compression stockings to help reduce swelling.  Please continue following with your primary care doctor and return with any new or suddenly worsening symptoms.

## 2023-06-04 DIAGNOSIS — M62511 Muscle wasting and atrophy, not elsewhere classified, right shoulder: Secondary | ICD-10-CM | POA: Diagnosis not present

## 2023-06-04 DIAGNOSIS — M79661 Pain in right lower leg: Secondary | ICD-10-CM | POA: Diagnosis not present

## 2023-06-04 DIAGNOSIS — M62551 Muscle wasting and atrophy, not elsewhere classified, right thigh: Secondary | ICD-10-CM | POA: Diagnosis not present

## 2023-06-04 DIAGNOSIS — G309 Alzheimer's disease, unspecified: Secondary | ICD-10-CM | POA: Diagnosis not present

## 2023-06-04 DIAGNOSIS — M62512 Muscle wasting and atrophy, not elsewhere classified, left shoulder: Secondary | ICD-10-CM | POA: Diagnosis not present

## 2023-06-04 DIAGNOSIS — M62552 Muscle wasting and atrophy, not elsewhere classified, left thigh: Secondary | ICD-10-CM | POA: Diagnosis not present

## 2023-06-04 DIAGNOSIS — W19XXXD Unspecified fall, subsequent encounter: Secondary | ICD-10-CM | POA: Diagnosis not present

## 2023-06-04 DIAGNOSIS — M62561 Muscle wasting and atrophy, not elsewhere classified, right lower leg: Secondary | ICD-10-CM | POA: Diagnosis not present

## 2023-06-04 DIAGNOSIS — R488 Other symbolic dysfunctions: Secondary | ICD-10-CM | POA: Diagnosis not present

## 2023-06-05 DIAGNOSIS — F331 Major depressive disorder, recurrent, moderate: Secondary | ICD-10-CM | POA: Diagnosis not present

## 2023-06-05 DIAGNOSIS — G309 Alzheimer's disease, unspecified: Secondary | ICD-10-CM | POA: Diagnosis not present

## 2023-06-05 DIAGNOSIS — F5105 Insomnia due to other mental disorder: Secondary | ICD-10-CM | POA: Diagnosis not present

## 2023-06-05 DIAGNOSIS — F411 Generalized anxiety disorder: Secondary | ICD-10-CM | POA: Diagnosis not present

## 2023-06-06 DIAGNOSIS — M62561 Muscle wasting and atrophy, not elsewhere classified, right lower leg: Secondary | ICD-10-CM | POA: Diagnosis not present

## 2023-06-06 DIAGNOSIS — M62551 Muscle wasting and atrophy, not elsewhere classified, right thigh: Secondary | ICD-10-CM | POA: Diagnosis not present

## 2023-06-06 DIAGNOSIS — M62512 Muscle wasting and atrophy, not elsewhere classified, left shoulder: Secondary | ICD-10-CM | POA: Diagnosis not present

## 2023-06-06 DIAGNOSIS — M62552 Muscle wasting and atrophy, not elsewhere classified, left thigh: Secondary | ICD-10-CM | POA: Diagnosis not present

## 2023-06-06 DIAGNOSIS — R488 Other symbolic dysfunctions: Secondary | ICD-10-CM | POA: Diagnosis not present

## 2023-06-06 DIAGNOSIS — M62511 Muscle wasting and atrophy, not elsewhere classified, right shoulder: Secondary | ICD-10-CM | POA: Diagnosis not present

## 2023-06-07 DIAGNOSIS — R488 Other symbolic dysfunctions: Secondary | ICD-10-CM | POA: Diagnosis not present

## 2023-06-07 DIAGNOSIS — M62511 Muscle wasting and atrophy, not elsewhere classified, right shoulder: Secondary | ICD-10-CM | POA: Diagnosis not present

## 2023-06-07 DIAGNOSIS — M62551 Muscle wasting and atrophy, not elsewhere classified, right thigh: Secondary | ICD-10-CM | POA: Diagnosis not present

## 2023-06-07 DIAGNOSIS — M62561 Muscle wasting and atrophy, not elsewhere classified, right lower leg: Secondary | ICD-10-CM | POA: Diagnosis not present

## 2023-06-07 DIAGNOSIS — M62512 Muscle wasting and atrophy, not elsewhere classified, left shoulder: Secondary | ICD-10-CM | POA: Diagnosis not present

## 2023-06-07 DIAGNOSIS — M62552 Muscle wasting and atrophy, not elsewhere classified, left thigh: Secondary | ICD-10-CM | POA: Diagnosis not present

## 2023-06-08 DIAGNOSIS — M62552 Muscle wasting and atrophy, not elsewhere classified, left thigh: Secondary | ICD-10-CM | POA: Diagnosis not present

## 2023-06-08 DIAGNOSIS — R488 Other symbolic dysfunctions: Secondary | ICD-10-CM | POA: Diagnosis not present

## 2023-06-08 DIAGNOSIS — M62511 Muscle wasting and atrophy, not elsewhere classified, right shoulder: Secondary | ICD-10-CM | POA: Diagnosis not present

## 2023-06-08 DIAGNOSIS — M62551 Muscle wasting and atrophy, not elsewhere classified, right thigh: Secondary | ICD-10-CM | POA: Diagnosis not present

## 2023-06-08 DIAGNOSIS — M62561 Muscle wasting and atrophy, not elsewhere classified, right lower leg: Secondary | ICD-10-CM | POA: Diagnosis not present

## 2023-06-08 DIAGNOSIS — M62512 Muscle wasting and atrophy, not elsewhere classified, left shoulder: Secondary | ICD-10-CM | POA: Diagnosis not present

## 2023-06-11 DIAGNOSIS — M62551 Muscle wasting and atrophy, not elsewhere classified, right thigh: Secondary | ICD-10-CM | POA: Diagnosis not present

## 2023-06-11 DIAGNOSIS — R488 Other symbolic dysfunctions: Secondary | ICD-10-CM | POA: Diagnosis not present

## 2023-06-11 DIAGNOSIS — M62561 Muscle wasting and atrophy, not elsewhere classified, right lower leg: Secondary | ICD-10-CM | POA: Diagnosis not present

## 2023-06-11 DIAGNOSIS — I10 Essential (primary) hypertension: Secondary | ICD-10-CM | POA: Diagnosis not present

## 2023-06-11 DIAGNOSIS — M62511 Muscle wasting and atrophy, not elsewhere classified, right shoulder: Secondary | ICD-10-CM | POA: Diagnosis not present

## 2023-06-11 DIAGNOSIS — M62552 Muscle wasting and atrophy, not elsewhere classified, left thigh: Secondary | ICD-10-CM | POA: Diagnosis not present

## 2023-06-11 DIAGNOSIS — E785 Hyperlipidemia, unspecified: Secondary | ICD-10-CM | POA: Diagnosis not present

## 2023-06-11 DIAGNOSIS — M62512 Muscle wasting and atrophy, not elsewhere classified, left shoulder: Secondary | ICD-10-CM | POA: Diagnosis not present

## 2023-06-12 DIAGNOSIS — R600 Localized edema: Secondary | ICD-10-CM | POA: Diagnosis not present

## 2023-06-13 DIAGNOSIS — R488 Other symbolic dysfunctions: Secondary | ICD-10-CM | POA: Diagnosis not present

## 2023-06-13 DIAGNOSIS — R600 Localized edema: Secondary | ICD-10-CM | POA: Diagnosis not present

## 2023-06-13 DIAGNOSIS — M62511 Muscle wasting and atrophy, not elsewhere classified, right shoulder: Secondary | ICD-10-CM | POA: Diagnosis not present

## 2023-06-13 DIAGNOSIS — I10 Essential (primary) hypertension: Secondary | ICD-10-CM | POA: Diagnosis not present

## 2023-06-13 DIAGNOSIS — M62552 Muscle wasting and atrophy, not elsewhere classified, left thigh: Secondary | ICD-10-CM | POA: Diagnosis not present

## 2023-06-13 DIAGNOSIS — M62561 Muscle wasting and atrophy, not elsewhere classified, right lower leg: Secondary | ICD-10-CM | POA: Diagnosis not present

## 2023-06-13 DIAGNOSIS — G309 Alzheimer's disease, unspecified: Secondary | ICD-10-CM | POA: Diagnosis not present

## 2023-06-13 DIAGNOSIS — M62551 Muscle wasting and atrophy, not elsewhere classified, right thigh: Secondary | ICD-10-CM | POA: Diagnosis not present

## 2023-06-13 DIAGNOSIS — M62512 Muscle wasting and atrophy, not elsewhere classified, left shoulder: Secondary | ICD-10-CM | POA: Diagnosis not present

## 2023-06-13 DIAGNOSIS — E119 Type 2 diabetes mellitus without complications: Secondary | ICD-10-CM | POA: Diagnosis not present

## 2023-06-14 DIAGNOSIS — M62552 Muscle wasting and atrophy, not elsewhere classified, left thigh: Secondary | ICD-10-CM | POA: Diagnosis not present

## 2023-06-14 DIAGNOSIS — R488 Other symbolic dysfunctions: Secondary | ICD-10-CM | POA: Diagnosis not present

## 2023-06-14 DIAGNOSIS — M62511 Muscle wasting and atrophy, not elsewhere classified, right shoulder: Secondary | ICD-10-CM | POA: Diagnosis not present

## 2023-06-14 DIAGNOSIS — M62561 Muscle wasting and atrophy, not elsewhere classified, right lower leg: Secondary | ICD-10-CM | POA: Diagnosis not present

## 2023-06-14 DIAGNOSIS — M62551 Muscle wasting and atrophy, not elsewhere classified, right thigh: Secondary | ICD-10-CM | POA: Diagnosis not present

## 2023-06-14 DIAGNOSIS — M62512 Muscle wasting and atrophy, not elsewhere classified, left shoulder: Secondary | ICD-10-CM | POA: Diagnosis not present

## 2023-06-15 DIAGNOSIS — R488 Other symbolic dysfunctions: Secondary | ICD-10-CM | POA: Diagnosis not present

## 2023-06-15 DIAGNOSIS — M62512 Muscle wasting and atrophy, not elsewhere classified, left shoulder: Secondary | ICD-10-CM | POA: Diagnosis not present

## 2023-06-15 DIAGNOSIS — M62552 Muscle wasting and atrophy, not elsewhere classified, left thigh: Secondary | ICD-10-CM | POA: Diagnosis not present

## 2023-06-15 DIAGNOSIS — M62511 Muscle wasting and atrophy, not elsewhere classified, right shoulder: Secondary | ICD-10-CM | POA: Diagnosis not present

## 2023-06-15 DIAGNOSIS — M62551 Muscle wasting and atrophy, not elsewhere classified, right thigh: Secondary | ICD-10-CM | POA: Diagnosis not present

## 2023-06-15 DIAGNOSIS — M62561 Muscle wasting and atrophy, not elsewhere classified, right lower leg: Secondary | ICD-10-CM | POA: Diagnosis not present

## 2023-06-19 DIAGNOSIS — M62561 Muscle wasting and atrophy, not elsewhere classified, right lower leg: Secondary | ICD-10-CM | POA: Diagnosis not present

## 2023-06-19 DIAGNOSIS — M62552 Muscle wasting and atrophy, not elsewhere classified, left thigh: Secondary | ICD-10-CM | POA: Diagnosis not present

## 2023-06-19 DIAGNOSIS — R488 Other symbolic dysfunctions: Secondary | ICD-10-CM | POA: Diagnosis not present

## 2023-06-19 DIAGNOSIS — M62551 Muscle wasting and atrophy, not elsewhere classified, right thigh: Secondary | ICD-10-CM | POA: Diagnosis not present

## 2023-06-19 DIAGNOSIS — M62562 Muscle wasting and atrophy, not elsewhere classified, left lower leg: Secondary | ICD-10-CM | POA: Diagnosis not present

## 2023-06-19 DIAGNOSIS — M62511 Muscle wasting and atrophy, not elsewhere classified, right shoulder: Secondary | ICD-10-CM | POA: Diagnosis not present

## 2023-06-19 DIAGNOSIS — M62512 Muscle wasting and atrophy, not elsewhere classified, left shoulder: Secondary | ICD-10-CM | POA: Diagnosis not present

## 2023-06-19 DIAGNOSIS — R2689 Other abnormalities of gait and mobility: Secondary | ICD-10-CM | POA: Diagnosis not present

## 2023-06-20 DIAGNOSIS — M62561 Muscle wasting and atrophy, not elsewhere classified, right lower leg: Secondary | ICD-10-CM | POA: Diagnosis not present

## 2023-06-20 DIAGNOSIS — M62552 Muscle wasting and atrophy, not elsewhere classified, left thigh: Secondary | ICD-10-CM | POA: Diagnosis not present

## 2023-06-20 DIAGNOSIS — M62512 Muscle wasting and atrophy, not elsewhere classified, left shoulder: Secondary | ICD-10-CM | POA: Diagnosis not present

## 2023-06-20 DIAGNOSIS — R488 Other symbolic dysfunctions: Secondary | ICD-10-CM | POA: Diagnosis not present

## 2023-06-20 DIAGNOSIS — M62551 Muscle wasting and atrophy, not elsewhere classified, right thigh: Secondary | ICD-10-CM | POA: Diagnosis not present

## 2023-06-20 DIAGNOSIS — M62511 Muscle wasting and atrophy, not elsewhere classified, right shoulder: Secondary | ICD-10-CM | POA: Diagnosis not present

## 2023-06-21 DIAGNOSIS — E876 Hypokalemia: Secondary | ICD-10-CM | POA: Diagnosis not present

## 2023-06-21 DIAGNOSIS — R488 Other symbolic dysfunctions: Secondary | ICD-10-CM | POA: Diagnosis not present

## 2023-06-21 DIAGNOSIS — I10 Essential (primary) hypertension: Secondary | ICD-10-CM | POA: Diagnosis not present

## 2023-06-21 DIAGNOSIS — D519 Vitamin B12 deficiency anemia, unspecified: Secondary | ICD-10-CM | POA: Diagnosis not present

## 2023-06-21 DIAGNOSIS — M62551 Muscle wasting and atrophy, not elsewhere classified, right thigh: Secondary | ICD-10-CM | POA: Diagnosis not present

## 2023-06-21 DIAGNOSIS — M62512 Muscle wasting and atrophy, not elsewhere classified, left shoulder: Secondary | ICD-10-CM | POA: Diagnosis not present

## 2023-06-21 DIAGNOSIS — E039 Hypothyroidism, unspecified: Secondary | ICD-10-CM | POA: Diagnosis not present

## 2023-06-21 DIAGNOSIS — R600 Localized edema: Secondary | ICD-10-CM | POA: Diagnosis not present

## 2023-06-21 DIAGNOSIS — E119 Type 2 diabetes mellitus without complications: Secondary | ICD-10-CM | POA: Diagnosis not present

## 2023-06-21 DIAGNOSIS — E782 Mixed hyperlipidemia: Secondary | ICD-10-CM | POA: Diagnosis not present

## 2023-06-21 DIAGNOSIS — M62552 Muscle wasting and atrophy, not elsewhere classified, left thigh: Secondary | ICD-10-CM | POA: Diagnosis not present

## 2023-06-21 DIAGNOSIS — M62511 Muscle wasting and atrophy, not elsewhere classified, right shoulder: Secondary | ICD-10-CM | POA: Diagnosis not present

## 2023-06-21 DIAGNOSIS — G4700 Insomnia, unspecified: Secondary | ICD-10-CM | POA: Diagnosis not present

## 2023-06-21 DIAGNOSIS — F03B4 Unspecified dementia, moderate, with anxiety: Secondary | ICD-10-CM | POA: Diagnosis not present

## 2023-06-21 DIAGNOSIS — M62561 Muscle wasting and atrophy, not elsewhere classified, right lower leg: Secondary | ICD-10-CM | POA: Diagnosis not present

## 2023-06-21 DIAGNOSIS — E559 Vitamin D deficiency, unspecified: Secondary | ICD-10-CM | POA: Diagnosis not present

## 2023-06-22 DIAGNOSIS — M62561 Muscle wasting and atrophy, not elsewhere classified, right lower leg: Secondary | ICD-10-CM | POA: Diagnosis not present

## 2023-06-22 DIAGNOSIS — R488 Other symbolic dysfunctions: Secondary | ICD-10-CM | POA: Diagnosis not present

## 2023-06-22 DIAGNOSIS — M62551 Muscle wasting and atrophy, not elsewhere classified, right thigh: Secondary | ICD-10-CM | POA: Diagnosis not present

## 2023-06-22 DIAGNOSIS — M62512 Muscle wasting and atrophy, not elsewhere classified, left shoulder: Secondary | ICD-10-CM | POA: Diagnosis not present

## 2023-06-22 DIAGNOSIS — M62552 Muscle wasting and atrophy, not elsewhere classified, left thigh: Secondary | ICD-10-CM | POA: Diagnosis not present

## 2023-06-22 DIAGNOSIS — M62511 Muscle wasting and atrophy, not elsewhere classified, right shoulder: Secondary | ICD-10-CM | POA: Diagnosis not present

## 2023-06-25 DIAGNOSIS — R488 Other symbolic dysfunctions: Secondary | ICD-10-CM | POA: Diagnosis not present

## 2023-06-25 DIAGNOSIS — M62551 Muscle wasting and atrophy, not elsewhere classified, right thigh: Secondary | ICD-10-CM | POA: Diagnosis not present

## 2023-06-25 DIAGNOSIS — M62561 Muscle wasting and atrophy, not elsewhere classified, right lower leg: Secondary | ICD-10-CM | POA: Diagnosis not present

## 2023-06-25 DIAGNOSIS — M62512 Muscle wasting and atrophy, not elsewhere classified, left shoulder: Secondary | ICD-10-CM | POA: Diagnosis not present

## 2023-06-25 DIAGNOSIS — M62511 Muscle wasting and atrophy, not elsewhere classified, right shoulder: Secondary | ICD-10-CM | POA: Diagnosis not present

## 2023-06-25 DIAGNOSIS — M62552 Muscle wasting and atrophy, not elsewhere classified, left thigh: Secondary | ICD-10-CM | POA: Diagnosis not present

## 2023-06-27 DIAGNOSIS — M62561 Muscle wasting and atrophy, not elsewhere classified, right lower leg: Secondary | ICD-10-CM | POA: Diagnosis not present

## 2023-06-27 DIAGNOSIS — M62551 Muscle wasting and atrophy, not elsewhere classified, right thigh: Secondary | ICD-10-CM | POA: Diagnosis not present

## 2023-06-27 DIAGNOSIS — M62512 Muscle wasting and atrophy, not elsewhere classified, left shoulder: Secondary | ICD-10-CM | POA: Diagnosis not present

## 2023-06-27 DIAGNOSIS — M62511 Muscle wasting and atrophy, not elsewhere classified, right shoulder: Secondary | ICD-10-CM | POA: Diagnosis not present

## 2023-06-27 DIAGNOSIS — M62552 Muscle wasting and atrophy, not elsewhere classified, left thigh: Secondary | ICD-10-CM | POA: Diagnosis not present

## 2023-06-27 DIAGNOSIS — R488 Other symbolic dysfunctions: Secondary | ICD-10-CM | POA: Diagnosis not present

## 2023-06-28 DIAGNOSIS — R488 Other symbolic dysfunctions: Secondary | ICD-10-CM | POA: Diagnosis not present

## 2023-06-28 DIAGNOSIS — M62552 Muscle wasting and atrophy, not elsewhere classified, left thigh: Secondary | ICD-10-CM | POA: Diagnosis not present

## 2023-06-28 DIAGNOSIS — M62551 Muscle wasting and atrophy, not elsewhere classified, right thigh: Secondary | ICD-10-CM | POA: Diagnosis not present

## 2023-06-28 DIAGNOSIS — M62511 Muscle wasting and atrophy, not elsewhere classified, right shoulder: Secondary | ICD-10-CM | POA: Diagnosis not present

## 2023-06-28 DIAGNOSIS — M62512 Muscle wasting and atrophy, not elsewhere classified, left shoulder: Secondary | ICD-10-CM | POA: Diagnosis not present

## 2023-06-28 DIAGNOSIS — M62561 Muscle wasting and atrophy, not elsewhere classified, right lower leg: Secondary | ICD-10-CM | POA: Diagnosis not present

## 2023-07-02 DIAGNOSIS — M62511 Muscle wasting and atrophy, not elsewhere classified, right shoulder: Secondary | ICD-10-CM | POA: Diagnosis not present

## 2023-07-02 DIAGNOSIS — M62512 Muscle wasting and atrophy, not elsewhere classified, left shoulder: Secondary | ICD-10-CM | POA: Diagnosis not present

## 2023-07-02 DIAGNOSIS — M62552 Muscle wasting and atrophy, not elsewhere classified, left thigh: Secondary | ICD-10-CM | POA: Diagnosis not present

## 2023-07-02 DIAGNOSIS — M62551 Muscle wasting and atrophy, not elsewhere classified, right thigh: Secondary | ICD-10-CM | POA: Diagnosis not present

## 2023-07-02 DIAGNOSIS — M62561 Muscle wasting and atrophy, not elsewhere classified, right lower leg: Secondary | ICD-10-CM | POA: Diagnosis not present

## 2023-07-02 DIAGNOSIS — R488 Other symbolic dysfunctions: Secondary | ICD-10-CM | POA: Diagnosis not present

## 2023-07-03 DIAGNOSIS — M62512 Muscle wasting and atrophy, not elsewhere classified, left shoulder: Secondary | ICD-10-CM | POA: Diagnosis not present

## 2023-07-03 DIAGNOSIS — F411 Generalized anxiety disorder: Secondary | ICD-10-CM | POA: Diagnosis not present

## 2023-07-03 DIAGNOSIS — M62552 Muscle wasting and atrophy, not elsewhere classified, left thigh: Secondary | ICD-10-CM | POA: Diagnosis not present

## 2023-07-03 DIAGNOSIS — F331 Major depressive disorder, recurrent, moderate: Secondary | ICD-10-CM | POA: Diagnosis not present

## 2023-07-03 DIAGNOSIS — M62511 Muscle wasting and atrophy, not elsewhere classified, right shoulder: Secondary | ICD-10-CM | POA: Diagnosis not present

## 2023-07-03 DIAGNOSIS — R488 Other symbolic dysfunctions: Secondary | ICD-10-CM | POA: Diagnosis not present

## 2023-07-03 DIAGNOSIS — B351 Tinea unguium: Secondary | ICD-10-CM | POA: Diagnosis not present

## 2023-07-03 DIAGNOSIS — M62551 Muscle wasting and atrophy, not elsewhere classified, right thigh: Secondary | ICD-10-CM | POA: Diagnosis not present

## 2023-07-03 DIAGNOSIS — M79674 Pain in right toe(s): Secondary | ICD-10-CM | POA: Diagnosis not present

## 2023-07-03 DIAGNOSIS — M79675 Pain in left toe(s): Secondary | ICD-10-CM | POA: Diagnosis not present

## 2023-07-03 DIAGNOSIS — F5105 Insomnia due to other mental disorder: Secondary | ICD-10-CM | POA: Diagnosis not present

## 2023-07-03 DIAGNOSIS — M62561 Muscle wasting and atrophy, not elsewhere classified, right lower leg: Secondary | ICD-10-CM | POA: Diagnosis not present

## 2023-07-03 DIAGNOSIS — G309 Alzheimer's disease, unspecified: Secondary | ICD-10-CM | POA: Diagnosis not present

## 2023-07-05 DIAGNOSIS — M62512 Muscle wasting and atrophy, not elsewhere classified, left shoulder: Secondary | ICD-10-CM | POA: Diagnosis not present

## 2023-07-05 DIAGNOSIS — M62551 Muscle wasting and atrophy, not elsewhere classified, right thigh: Secondary | ICD-10-CM | POA: Diagnosis not present

## 2023-07-05 DIAGNOSIS — M62511 Muscle wasting and atrophy, not elsewhere classified, right shoulder: Secondary | ICD-10-CM | POA: Diagnosis not present

## 2023-07-05 DIAGNOSIS — R488 Other symbolic dysfunctions: Secondary | ICD-10-CM | POA: Diagnosis not present

## 2023-07-05 DIAGNOSIS — M62561 Muscle wasting and atrophy, not elsewhere classified, right lower leg: Secondary | ICD-10-CM | POA: Diagnosis not present

## 2023-07-05 DIAGNOSIS — M62552 Muscle wasting and atrophy, not elsewhere classified, left thigh: Secondary | ICD-10-CM | POA: Diagnosis not present

## 2023-07-06 DIAGNOSIS — M62552 Muscle wasting and atrophy, not elsewhere classified, left thigh: Secondary | ICD-10-CM | POA: Diagnosis not present

## 2023-07-06 DIAGNOSIS — M62551 Muscle wasting and atrophy, not elsewhere classified, right thigh: Secondary | ICD-10-CM | POA: Diagnosis not present

## 2023-07-06 DIAGNOSIS — M62511 Muscle wasting and atrophy, not elsewhere classified, right shoulder: Secondary | ICD-10-CM | POA: Diagnosis not present

## 2023-07-06 DIAGNOSIS — R488 Other symbolic dysfunctions: Secondary | ICD-10-CM | POA: Diagnosis not present

## 2023-07-06 DIAGNOSIS — M62561 Muscle wasting and atrophy, not elsewhere classified, right lower leg: Secondary | ICD-10-CM | POA: Diagnosis not present

## 2023-07-06 DIAGNOSIS — M62512 Muscle wasting and atrophy, not elsewhere classified, left shoulder: Secondary | ICD-10-CM | POA: Diagnosis not present

## 2023-07-09 DIAGNOSIS — M62511 Muscle wasting and atrophy, not elsewhere classified, right shoulder: Secondary | ICD-10-CM | POA: Diagnosis not present

## 2023-07-09 DIAGNOSIS — M62512 Muscle wasting and atrophy, not elsewhere classified, left shoulder: Secondary | ICD-10-CM | POA: Diagnosis not present

## 2023-07-09 DIAGNOSIS — R488 Other symbolic dysfunctions: Secondary | ICD-10-CM | POA: Diagnosis not present

## 2023-07-09 DIAGNOSIS — M62561 Muscle wasting and atrophy, not elsewhere classified, right lower leg: Secondary | ICD-10-CM | POA: Diagnosis not present

## 2023-07-09 DIAGNOSIS — M62551 Muscle wasting and atrophy, not elsewhere classified, right thigh: Secondary | ICD-10-CM | POA: Diagnosis not present

## 2023-07-09 DIAGNOSIS — M62552 Muscle wasting and atrophy, not elsewhere classified, left thigh: Secondary | ICD-10-CM | POA: Diagnosis not present

## 2023-07-10 DIAGNOSIS — M62512 Muscle wasting and atrophy, not elsewhere classified, left shoulder: Secondary | ICD-10-CM | POA: Diagnosis not present

## 2023-07-10 DIAGNOSIS — M62561 Muscle wasting and atrophy, not elsewhere classified, right lower leg: Secondary | ICD-10-CM | POA: Diagnosis not present

## 2023-07-10 DIAGNOSIS — M62552 Muscle wasting and atrophy, not elsewhere classified, left thigh: Secondary | ICD-10-CM | POA: Diagnosis not present

## 2023-07-10 DIAGNOSIS — M62551 Muscle wasting and atrophy, not elsewhere classified, right thigh: Secondary | ICD-10-CM | POA: Diagnosis not present

## 2023-07-10 DIAGNOSIS — M62511 Muscle wasting and atrophy, not elsewhere classified, right shoulder: Secondary | ICD-10-CM | POA: Diagnosis not present

## 2023-07-10 DIAGNOSIS — R488 Other symbolic dysfunctions: Secondary | ICD-10-CM | POA: Diagnosis not present

## 2023-07-11 DIAGNOSIS — Z23 Encounter for immunization: Secondary | ICD-10-CM | POA: Diagnosis not present

## 2023-07-11 DIAGNOSIS — M62552 Muscle wasting and atrophy, not elsewhere classified, left thigh: Secondary | ICD-10-CM | POA: Diagnosis not present

## 2023-07-11 DIAGNOSIS — M62511 Muscle wasting and atrophy, not elsewhere classified, right shoulder: Secondary | ICD-10-CM | POA: Diagnosis not present

## 2023-07-11 DIAGNOSIS — M62551 Muscle wasting and atrophy, not elsewhere classified, right thigh: Secondary | ICD-10-CM | POA: Diagnosis not present

## 2023-07-11 DIAGNOSIS — M62512 Muscle wasting and atrophy, not elsewhere classified, left shoulder: Secondary | ICD-10-CM | POA: Diagnosis not present

## 2023-07-11 DIAGNOSIS — R488 Other symbolic dysfunctions: Secondary | ICD-10-CM | POA: Diagnosis not present

## 2023-07-11 DIAGNOSIS — M62561 Muscle wasting and atrophy, not elsewhere classified, right lower leg: Secondary | ICD-10-CM | POA: Diagnosis not present

## 2023-07-12 DIAGNOSIS — M62551 Muscle wasting and atrophy, not elsewhere classified, right thigh: Secondary | ICD-10-CM | POA: Diagnosis not present

## 2023-07-12 DIAGNOSIS — R488 Other symbolic dysfunctions: Secondary | ICD-10-CM | POA: Diagnosis not present

## 2023-07-12 DIAGNOSIS — M62512 Muscle wasting and atrophy, not elsewhere classified, left shoulder: Secondary | ICD-10-CM | POA: Diagnosis not present

## 2023-07-12 DIAGNOSIS — M62511 Muscle wasting and atrophy, not elsewhere classified, right shoulder: Secondary | ICD-10-CM | POA: Diagnosis not present

## 2023-07-12 DIAGNOSIS — M62561 Muscle wasting and atrophy, not elsewhere classified, right lower leg: Secondary | ICD-10-CM | POA: Diagnosis not present

## 2023-07-12 DIAGNOSIS — M62552 Muscle wasting and atrophy, not elsewhere classified, left thigh: Secondary | ICD-10-CM | POA: Diagnosis not present

## 2023-07-13 DIAGNOSIS — M62561 Muscle wasting and atrophy, not elsewhere classified, right lower leg: Secondary | ICD-10-CM | POA: Diagnosis not present

## 2023-07-13 DIAGNOSIS — M62511 Muscle wasting and atrophy, not elsewhere classified, right shoulder: Secondary | ICD-10-CM | POA: Diagnosis not present

## 2023-07-13 DIAGNOSIS — M62551 Muscle wasting and atrophy, not elsewhere classified, right thigh: Secondary | ICD-10-CM | POA: Diagnosis not present

## 2023-07-13 DIAGNOSIS — M62512 Muscle wasting and atrophy, not elsewhere classified, left shoulder: Secondary | ICD-10-CM | POA: Diagnosis not present

## 2023-07-13 DIAGNOSIS — R488 Other symbolic dysfunctions: Secondary | ICD-10-CM | POA: Diagnosis not present

## 2023-07-13 DIAGNOSIS — M62552 Muscle wasting and atrophy, not elsewhere classified, left thigh: Secondary | ICD-10-CM | POA: Diagnosis not present

## 2023-07-16 DIAGNOSIS — G4700 Insomnia, unspecified: Secondary | ICD-10-CM | POA: Diagnosis not present

## 2023-07-16 DIAGNOSIS — E782 Mixed hyperlipidemia: Secondary | ICD-10-CM | POA: Diagnosis not present

## 2023-07-16 DIAGNOSIS — D519 Vitamin B12 deficiency anemia, unspecified: Secondary | ICD-10-CM | POA: Diagnosis not present

## 2023-07-16 DIAGNOSIS — E876 Hypokalemia: Secondary | ICD-10-CM | POA: Diagnosis not present

## 2023-07-16 DIAGNOSIS — E559 Vitamin D deficiency, unspecified: Secondary | ICD-10-CM | POA: Diagnosis not present

## 2023-07-16 DIAGNOSIS — E039 Hypothyroidism, unspecified: Secondary | ICD-10-CM | POA: Diagnosis not present

## 2023-07-16 DIAGNOSIS — E785 Hyperlipidemia, unspecified: Secondary | ICD-10-CM | POA: Diagnosis not present

## 2023-07-16 DIAGNOSIS — F03B4 Unspecified dementia, moderate, with anxiety: Secondary | ICD-10-CM | POA: Diagnosis not present

## 2023-07-16 DIAGNOSIS — R600 Localized edema: Secondary | ICD-10-CM | POA: Diagnosis not present

## 2023-07-16 DIAGNOSIS — E119 Type 2 diabetes mellitus without complications: Secondary | ICD-10-CM | POA: Diagnosis not present

## 2023-07-16 DIAGNOSIS — I10 Essential (primary) hypertension: Secondary | ICD-10-CM | POA: Diagnosis not present

## 2023-07-17 ENCOUNTER — Ambulatory Visit (INDEPENDENT_AMBULATORY_CARE_PROVIDER_SITE_OTHER): Payer: Medicare Other | Admitting: Otolaryngology

## 2023-07-17 ENCOUNTER — Encounter (INDEPENDENT_AMBULATORY_CARE_PROVIDER_SITE_OTHER): Payer: Self-pay | Admitting: Otolaryngology

## 2023-07-17 VITALS — Ht 59.0 in | Wt 146.0 lb

## 2023-07-17 DIAGNOSIS — M62512 Muscle wasting and atrophy, not elsewhere classified, left shoulder: Secondary | ICD-10-CM | POA: Diagnosis not present

## 2023-07-17 DIAGNOSIS — R488 Other symbolic dysfunctions: Secondary | ICD-10-CM | POA: Diagnosis not present

## 2023-07-17 DIAGNOSIS — H6123 Impacted cerumen, bilateral: Secondary | ICD-10-CM | POA: Diagnosis not present

## 2023-07-17 DIAGNOSIS — M62511 Muscle wasting and atrophy, not elsewhere classified, right shoulder: Secondary | ICD-10-CM | POA: Diagnosis not present

## 2023-07-17 NOTE — Progress Notes (Signed)
CC: Recurrent cerumen impaction  Procedure: Bilateral recurrent cerumen disimpaction.    Indication: Cerumen impaction, resulting in ear discomfort and conductive hearing loss.    Description: The patient is placed supine on the operating table.  Under the operating microscope, the right ear canal is examined and is noted to be completely impacted with cerumen.  The cerumen is carefully removed with a combination of suction catheters, cerumen curette, and alligator forceps.  After the cerumen removal, the ear canal and tympanic membrane are noted to be normal.  No middle ear effusion is noted.  The same procedure is then repeated on the left side without exception. The patient tolerated the procedure well.  Follow-up care:  The patient is instructed not to use Q-tips to clean the ear canals.  The patient will follow up in 6 months.

## 2023-07-19 DIAGNOSIS — M62512 Muscle wasting and atrophy, not elsewhere classified, left shoulder: Secondary | ICD-10-CM | POA: Diagnosis not present

## 2023-07-19 DIAGNOSIS — M62511 Muscle wasting and atrophy, not elsewhere classified, right shoulder: Secondary | ICD-10-CM | POA: Diagnosis not present

## 2023-07-19 DIAGNOSIS — R488 Other symbolic dysfunctions: Secondary | ICD-10-CM | POA: Diagnosis not present

## 2023-07-20 DIAGNOSIS — M62511 Muscle wasting and atrophy, not elsewhere classified, right shoulder: Secondary | ICD-10-CM | POA: Diagnosis not present

## 2023-07-20 DIAGNOSIS — R488 Other symbolic dysfunctions: Secondary | ICD-10-CM | POA: Diagnosis not present

## 2023-07-20 DIAGNOSIS — M62512 Muscle wasting and atrophy, not elsewhere classified, left shoulder: Secondary | ICD-10-CM | POA: Diagnosis not present

## 2023-07-23 DIAGNOSIS — R488 Other symbolic dysfunctions: Secondary | ICD-10-CM | POA: Diagnosis not present

## 2023-07-23 DIAGNOSIS — M62511 Muscle wasting and atrophy, not elsewhere classified, right shoulder: Secondary | ICD-10-CM | POA: Diagnosis not present

## 2023-07-23 DIAGNOSIS — M62512 Muscle wasting and atrophy, not elsewhere classified, left shoulder: Secondary | ICD-10-CM | POA: Diagnosis not present

## 2023-07-25 DIAGNOSIS — M62512 Muscle wasting and atrophy, not elsewhere classified, left shoulder: Secondary | ICD-10-CM | POA: Diagnosis not present

## 2023-07-25 DIAGNOSIS — R488 Other symbolic dysfunctions: Secondary | ICD-10-CM | POA: Diagnosis not present

## 2023-07-25 DIAGNOSIS — M62511 Muscle wasting and atrophy, not elsewhere classified, right shoulder: Secondary | ICD-10-CM | POA: Diagnosis not present

## 2023-07-27 DIAGNOSIS — M62512 Muscle wasting and atrophy, not elsewhere classified, left shoulder: Secondary | ICD-10-CM | POA: Diagnosis not present

## 2023-07-27 DIAGNOSIS — M62511 Muscle wasting and atrophy, not elsewhere classified, right shoulder: Secondary | ICD-10-CM | POA: Diagnosis not present

## 2023-07-27 DIAGNOSIS — R488 Other symbolic dysfunctions: Secondary | ICD-10-CM | POA: Diagnosis not present

## 2023-07-30 DIAGNOSIS — D519 Vitamin B12 deficiency anemia, unspecified: Secondary | ICD-10-CM | POA: Diagnosis not present

## 2023-07-30 DIAGNOSIS — F03B4 Unspecified dementia, moderate, with anxiety: Secondary | ICD-10-CM | POA: Diagnosis not present

## 2023-07-30 DIAGNOSIS — I130 Hypertensive heart and chronic kidney disease with heart failure and stage 1 through stage 4 chronic kidney disease, or unspecified chronic kidney disease: Secondary | ICD-10-CM | POA: Diagnosis not present

## 2023-07-30 DIAGNOSIS — E039 Hypothyroidism, unspecified: Secondary | ICD-10-CM | POA: Diagnosis not present

## 2023-07-30 DIAGNOSIS — E876 Hypokalemia: Secondary | ICD-10-CM | POA: Diagnosis not present

## 2023-07-30 DIAGNOSIS — R488 Other symbolic dysfunctions: Secondary | ICD-10-CM | POA: Diagnosis not present

## 2023-07-30 DIAGNOSIS — M62511 Muscle wasting and atrophy, not elsewhere classified, right shoulder: Secondary | ICD-10-CM | POA: Diagnosis not present

## 2023-07-30 DIAGNOSIS — M62512 Muscle wasting and atrophy, not elsewhere classified, left shoulder: Secondary | ICD-10-CM | POA: Diagnosis not present

## 2023-07-30 DIAGNOSIS — E559 Vitamin D deficiency, unspecified: Secondary | ICD-10-CM | POA: Diagnosis not present

## 2023-07-30 DIAGNOSIS — R600 Localized edema: Secondary | ICD-10-CM | POA: Diagnosis not present

## 2023-07-30 DIAGNOSIS — E782 Mixed hyperlipidemia: Secondary | ICD-10-CM | POA: Diagnosis not present

## 2023-07-30 DIAGNOSIS — G4700 Insomnia, unspecified: Secondary | ICD-10-CM | POA: Diagnosis not present

## 2023-07-30 DIAGNOSIS — E119 Type 2 diabetes mellitus without complications: Secondary | ICD-10-CM | POA: Diagnosis not present

## 2023-07-31 DIAGNOSIS — R488 Other symbolic dysfunctions: Secondary | ICD-10-CM | POA: Diagnosis not present

## 2023-07-31 DIAGNOSIS — M62512 Muscle wasting and atrophy, not elsewhere classified, left shoulder: Secondary | ICD-10-CM | POA: Diagnosis not present

## 2023-07-31 DIAGNOSIS — F331 Major depressive disorder, recurrent, moderate: Secondary | ICD-10-CM | POA: Diagnosis not present

## 2023-07-31 DIAGNOSIS — F5105 Insomnia due to other mental disorder: Secondary | ICD-10-CM | POA: Diagnosis not present

## 2023-07-31 DIAGNOSIS — F411 Generalized anxiety disorder: Secondary | ICD-10-CM | POA: Diagnosis not present

## 2023-07-31 DIAGNOSIS — M62511 Muscle wasting and atrophy, not elsewhere classified, right shoulder: Secondary | ICD-10-CM | POA: Diagnosis not present

## 2023-07-31 DIAGNOSIS — G309 Alzheimer's disease, unspecified: Secondary | ICD-10-CM | POA: Diagnosis not present

## 2023-08-01 DIAGNOSIS — M62512 Muscle wasting and atrophy, not elsewhere classified, left shoulder: Secondary | ICD-10-CM | POA: Diagnosis not present

## 2023-08-01 DIAGNOSIS — M62511 Muscle wasting and atrophy, not elsewhere classified, right shoulder: Secondary | ICD-10-CM | POA: Diagnosis not present

## 2023-08-01 DIAGNOSIS — R488 Other symbolic dysfunctions: Secondary | ICD-10-CM | POA: Diagnosis not present

## 2023-08-27 DIAGNOSIS — F331 Major depressive disorder, recurrent, moderate: Secondary | ICD-10-CM | POA: Diagnosis not present

## 2023-08-27 DIAGNOSIS — F411 Generalized anxiety disorder: Secondary | ICD-10-CM | POA: Diagnosis not present

## 2023-08-27 DIAGNOSIS — G309 Alzheimer's disease, unspecified: Secondary | ICD-10-CM | POA: Diagnosis not present

## 2023-08-27 DIAGNOSIS — F5105 Insomnia due to other mental disorder: Secondary | ICD-10-CM | POA: Diagnosis not present

## 2023-09-12 DIAGNOSIS — G4700 Insomnia, unspecified: Secondary | ICD-10-CM | POA: Diagnosis not present

## 2023-09-12 DIAGNOSIS — E782 Mixed hyperlipidemia: Secondary | ICD-10-CM | POA: Diagnosis not present

## 2023-09-12 DIAGNOSIS — E876 Hypokalemia: Secondary | ICD-10-CM | POA: Diagnosis not present

## 2023-09-12 DIAGNOSIS — E039 Hypothyroidism, unspecified: Secondary | ICD-10-CM | POA: Diagnosis not present

## 2023-09-12 DIAGNOSIS — E559 Vitamin D deficiency, unspecified: Secondary | ICD-10-CM | POA: Diagnosis not present

## 2023-09-12 DIAGNOSIS — D519 Vitamin B12 deficiency anemia, unspecified: Secondary | ICD-10-CM | POA: Diagnosis not present

## 2023-09-12 DIAGNOSIS — E119 Type 2 diabetes mellitus without complications: Secondary | ICD-10-CM | POA: Diagnosis not present

## 2023-09-12 DIAGNOSIS — R600 Localized edema: Secondary | ICD-10-CM | POA: Diagnosis not present

## 2023-09-12 DIAGNOSIS — I1310 Hypertensive heart and chronic kidney disease without heart failure, with stage 1 through stage 4 chronic kidney disease, or unspecified chronic kidney disease: Secondary | ICD-10-CM | POA: Diagnosis not present

## 2023-09-14 ENCOUNTER — Emergency Department (HOSPITAL_COMMUNITY)
Admission: EM | Admit: 2023-09-14 | Discharge: 2023-09-14 | Disposition: A | Discharge status: Home / Self Care | Payer: Medicare Other | Attending: Emergency Medicine | Admitting: Emergency Medicine

## 2023-09-14 ENCOUNTER — Emergency Department (HOSPITAL_COMMUNITY): Payer: Medicare Other

## 2023-09-14 ENCOUNTER — Other Ambulatory Visit: Payer: Self-pay

## 2023-09-14 ENCOUNTER — Encounter (HOSPITAL_COMMUNITY): Payer: Self-pay

## 2023-09-14 DIAGNOSIS — W19XXXA Unspecified fall, initial encounter: Secondary | ICD-10-CM

## 2023-09-14 DIAGNOSIS — M25511 Pain in right shoulder: Secondary | ICD-10-CM | POA: Diagnosis not present

## 2023-09-14 DIAGNOSIS — F0390 Unspecified dementia without behavioral disturbance: Secondary | ICD-10-CM | POA: Insufficient documentation

## 2023-09-14 DIAGNOSIS — Z96643 Presence of artificial hip joint, bilateral: Secondary | ICD-10-CM | POA: Diagnosis not present

## 2023-09-14 DIAGNOSIS — M461 Sacroiliitis, not elsewhere classified: Secondary | ICD-10-CM | POA: Diagnosis not present

## 2023-09-14 DIAGNOSIS — R519 Headache, unspecified: Secondary | ICD-10-CM | POA: Insufficient documentation

## 2023-09-14 DIAGNOSIS — M25552 Pain in left hip: Secondary | ICD-10-CM | POA: Diagnosis not present

## 2023-09-14 DIAGNOSIS — M25512 Pain in left shoulder: Secondary | ICD-10-CM | POA: Diagnosis not present

## 2023-09-14 DIAGNOSIS — S199XXA Unspecified injury of neck, initial encounter: Secondary | ICD-10-CM | POA: Diagnosis not present

## 2023-09-14 DIAGNOSIS — M25551 Pain in right hip: Secondary | ICD-10-CM | POA: Diagnosis not present

## 2023-09-14 DIAGNOSIS — S0990XA Unspecified injury of head, initial encounter: Secondary | ICD-10-CM | POA: Diagnosis not present

## 2023-09-14 DIAGNOSIS — M79651 Pain in right thigh: Secondary | ICD-10-CM | POA: Diagnosis not present

## 2023-09-14 DIAGNOSIS — E041 Nontoxic single thyroid nodule: Secondary | ICD-10-CM | POA: Diagnosis not present

## 2023-09-14 DIAGNOSIS — I6782 Cerebral ischemia: Secondary | ICD-10-CM | POA: Diagnosis not present

## 2023-09-14 DIAGNOSIS — M1711 Unilateral primary osteoarthritis, right knee: Secondary | ICD-10-CM | POA: Diagnosis not present

## 2023-09-14 HISTORY — DX: Unspecified dementia, unspecified severity, without behavioral disturbance, psychotic disturbance, mood disturbance, and anxiety: F03.90

## 2023-09-14 LAB — URINALYSIS, ROUTINE W REFLEX MICROSCOPIC
Bilirubin Urine: NEGATIVE
Glucose, UA: NEGATIVE mg/dL
Hgb urine dipstick: NEGATIVE
Ketones, ur: 5 mg/dL — AB
Leukocytes,Ua: NEGATIVE
Nitrite: NEGATIVE
Protein, ur: 100 mg/dL — AB
Specific Gravity, Urine: 1.018 (ref 1.005–1.030)
pH: 6 (ref 5.0–8.0)

## 2023-09-14 LAB — CBC WITH DIFFERENTIAL/PLATELET
Abs Immature Granulocytes: 0.06 10*3/uL (ref 0.00–0.07)
Basophils Absolute: 0 10*3/uL (ref 0.0–0.1)
Basophils Relative: 0 %
Eosinophils Absolute: 0 10*3/uL (ref 0.0–0.5)
Eosinophils Relative: 0 %
HCT: 40.1 % (ref 36.0–46.0)
Hemoglobin: 12.9 g/dL (ref 12.0–15.0)
Immature Granulocytes: 0 %
Lymphocytes Relative: 11 %
Lymphs Abs: 1.5 10*3/uL (ref 0.7–4.0)
MCH: 29.6 pg (ref 26.0–34.0)
MCHC: 32.2 g/dL (ref 30.0–36.0)
MCV: 92 fL (ref 80.0–100.0)
Monocytes Absolute: 0.7 10*3/uL (ref 0.1–1.0)
Monocytes Relative: 5 %
Neutro Abs: 11.3 10*3/uL — ABNORMAL HIGH (ref 1.7–7.7)
Neutrophils Relative %: 84 %
Platelets: 312 10*3/uL (ref 150–400)
RBC: 4.36 MIL/uL (ref 3.87–5.11)
RDW: 13.5 % (ref 11.5–15.5)
WBC: 13.7 10*3/uL — ABNORMAL HIGH (ref 4.0–10.5)
nRBC: 0 % (ref 0.0–0.2)

## 2023-09-14 LAB — BASIC METABOLIC PANEL
Anion gap: 10 (ref 5–15)
BUN: 22 mg/dL (ref 8–23)
CO2: 23 mmol/L (ref 22–32)
Calcium: 9.4 mg/dL (ref 8.9–10.3)
Chloride: 108 mmol/L (ref 98–111)
Creatinine, Ser: 0.87 mg/dL (ref 0.44–1.00)
GFR, Estimated: >60 mL/min (ref >60)
Glucose, Bld: 129 mg/dL — ABNORMAL HIGH (ref 70–99)
Potassium: 3.9 mmol/L (ref 3.5–5.1)
Sodium: 141 mmol/L (ref 135–145)

## 2023-09-14 MED ORDER — ACETAMINOPHEN 325 MG PO TABS
650.0000 mg | ORAL_TABLET | Freq: Once | ORAL | Status: AC
  Administered 2023-09-14: 650 mg via ORAL
  Filled 2023-09-14: qty 2

## 2023-09-14 NOTE — ED Provider Notes (Signed)
[place] EMERGENCY DEPARTMENT AT [facility] Provider Note   CSN: [id number] Arrival date & time: 09/14/23  6295     History  Chief Complaint  Patient presents with   [name]    Felicia Kelly is a 87 y.o. female.  Patient to ED via EMS from [facility] reporting fall. Per EMS, she suffered a mechanical fall while walking to the bathroom. The patient cannot offer any details of fall, stating she fell 5 days ago.   Per staff at [facility], she was found this morning next to her bed after an unwitnessed fall. She was found sitting up on the floor and had vomited. No bleeding or wound noted. She was unable to get up without assistance. She complained of pain in her hips bilaterally and of a headache. Not anticoagulated. No vomiting since. Per staff, she is at baseline mental status.  The history is provided by the nursing home and the EMS personnel. No language interpreter was used.  Fall       Home Medications Prior to Admission medications   Not on File      Allergies    Atorvastatin, Bactrim [sulfamethoxazole-trimethoprim], Celecoxib, Niacin, Rosuvastatin, Simvastatin, Tetracyclines & related, and Penicillins    Review of Systems   Review of Systems  Physical Exam Updated Vital Signs BP (!) 153/81   Pulse 86   Temp 98.5 F (36.9 C) (Oral)   Resp 18   Wt 68.6 kg   SpO2 97%  Physical Exam Vitals and nursing note reviewed.  Constitutional:      Appearance: Normal appearance.     Comments: Pleasantly demented  HENT:     Head: Normocephalic and atraumatic.     Comments: No scalp tenderness or hematoma.  Eyes:     Pupils: Pupils are equal, round, and reactive to light.  Neck:     Comments: No midline cervical tenderness.  Cardiovascular:     Rate and Rhythm: Normal rate and regular rhythm.     Heart sounds: No murmur heard. Pulmonary:     Effort: Pulmonary effort is normal. No respiratory distress.     Comments: No bruising of chest  wall Chest:     Chest wall: No tenderness.  Abdominal:     Palpations: Abdomen is soft.     Tenderness: There is no abdominal tenderness.     Comments: No bruising of abdominal wall.   Musculoskeletal:        General: Normal range of motion.     Cervical back: Normal range of motion and neck supple.     Comments: FROM UE's without tenderness to palpation. LE's: Hips nontender bilaterally. No shortening or rotation. Left thigh tender to palpation without swelling, redness or bruising. No pain with passive ROM of the left leg, including rotation, extension and knee flexion. Right thigh also tender to palpation without swelling, redness or bruising. She does report pain with passive rotation. Knees and lower legs nontender. No bruising or redness of the back. No spinal tenderness.   Skin:    General: Skin is warm and dry.     Findings: No bruising or erythema.  Neurological:     Mental Status: She is alert.     Comments: She has good eye contact. She follows commands and is cooperative on exam. She has an intention tremor of the UE's. She is confused as to date, time, location. Knows her sister sitting at bedside.      ED Results / Procedures / Treatments  Labs (all labs ordered are listed, but only abnormal results are displayed) Labs Reviewed  CBC WITH DIFFERENTIAL/PLATELET - Abnormal; Notable for the following components:      Result Value   WBC 13.7 (*)    Neutro Abs 11.3 (*)    All other components within normal limits  BASIC METABOLIC PANEL - Abnormal; Notable for the following components:   Glucose, Bld 129 (*)    All other components within normal limits  URINALYSIS, ROUTINE W REFLEX MICROSCOPIC - Abnormal; Notable for the following components:   APPearance HAZY (*)    Ketones, ur 5 (*)    Protein, ur 100 (*)    Bacteria, UA RARE (*)    All other components within normal limits   Results for orders placed or performed during the hospital encounter of 09/14/23  CBC with  Differential  Result Value Ref Range   WBC 13.7 (H) 4.0 - 10.5 K/uL   RBC 4.36 3.87 - 5.11 MIL/uL   Hemoglobin 12.9 12.0 - 15.0 g/dL   HCT 44.0 34.7 - 42.5 %   MCV 92.0 80.0 - 100.0 fL   MCH 29.6 26.0 - 34.0 pg   MCHC 32.2 30.0 - 36.0 g/dL   RDW 95.6 38.7 - 56.4 %   Platelets 312 150 - 400 K/uL   nRBC 0.0 0.0 - 0.2 %   Neutrophils Relative % 84 %   Neutro Abs 11.3 (H) 1.7 - 7.7 K/uL   Lymphocytes Relative 11 %   Lymphs Abs 1.5 0.7 - 4.0 K/uL   Monocytes Relative 5 %   Monocytes Absolute 0.7 0.1 - 1.0 K/uL   Eosinophils Relative 0 %   Eosinophils Absolute 0.0 0.0 - 0.5 K/uL   Basophils Relative 0 %   Basophils Absolute 0.0 0.0 - 0.1 K/uL   Immature Granulocytes 0 %   Abs Immature Granulocytes 0.06 0.00 - 0.07 K/uL  Basic metabolic panel  Result Value Ref Range   Sodium 141 135 - 145 mmol/L   Potassium 3.9 3.5 - 5.1 mmol/L   Chloride 108 98 - 111 mmol/L   CO2 23 22 - 32 mmol/L   Glucose, Bld 129 (H) 70 - 99 mg/dL   BUN 22 8 - 23 mg/dL   Creatinine, Ser 3.32 0.44 - 1.00 mg/dL   Calcium 9.4 8.9 - 95.1 mg/dL   GFR, Estimated >88 >41 mL/min   Anion gap 10 5 - 15  Urinalysis, Routine w reflex microscopic -Urine, Clean Catch  Result Value Ref Range   Color, Urine YELLOW YELLOW   APPearance HAZY (A) CLEAR   Specific Gravity, Urine 1.018 1.005 - 1.030   pH 6.0 5.0 - 8.0   Glucose, UA NEGATIVE NEGATIVE mg/dL   Hgb urine dipstick NEGATIVE NEGATIVE   Bilirubin Urine NEGATIVE NEGATIVE   Ketones, ur 5 (A) NEGATIVE mg/dL   Protein, ur 660 (A) NEGATIVE mg/dL   Nitrite NEGATIVE NEGATIVE   Leukocytes,Ua NEGATIVE NEGATIVE   RBC / HPF 21-50 0 - 5 RBC/hpf   WBC, UA 0-5 0 - 5 WBC/hpf   Bacteria, UA RARE (A) NONE SEEN   Squamous Epithelial / HPF 0-5 0 - 5 /HPF   Mucus PRESENT    Hyaline Casts, UA PRESENT    Ca Oxalate Crys, UA PRESENT     EKG None  Radiology DG Hips Bilat W or Wo Pelvis 3-4 Views  Result Date: 09/14/2023 CLINICAL DATA:  Unwitnessed fall.  Right thigh pain.  EXAM: DG HIP (WITH OR WITHOUT PELVIS) 3-4V  BILAT; RIGHT FEMUR 2 VIEWS COMPARISON:  06/02/2023. FINDINGS: Grade artifacts noted overlying the x-rays. There is diffuse osteopenia of the visualized osseous structures. No acute fracture or dislocation. No aggressive osseous lesion. There are changes of chronic sacroiliitis. Visualized sacral arcuate lines are unremarkable. Unremarkable symphysis pubis. Redemonstration of bilateral total hip arthroplasty The hardware is intact. No periprosthetic fracture or lucency. No interval change in alignment, when compared to the available recent prior examination. There are mild-to-moderate degenerative changes of right knee joint. No radiopaque foreign bodies. IMPRESSION: *No acute osseous abnormality of the pelvis or right femur. Electronically Signed   By: Jules Schick M.D.   On: 09/14/2023 09:19   DG FEMUR, MIN 2 VIEWS RIGHT  Result Date: 09/14/2023 CLINICAL DATA:  Unwitnessed fall.  Right thigh pain. EXAM: DG HIP (WITH OR WITHOUT PELVIS) 3-4V BILAT; RIGHT FEMUR 2 VIEWS COMPARISON:  06/02/2023. FINDINGS: Grade artifacts noted overlying the x-rays. There is diffuse osteopenia of the visualized osseous structures. No acute fracture or dislocation. No aggressive osseous lesion. There are changes of chronic sacroiliitis. Visualized sacral arcuate lines are unremarkable. Unremarkable symphysis pubis. Redemonstration of bilateral total hip arthroplasty The hardware is intact. No periprosthetic fracture or lucency. No interval change in alignment, when compared to the available recent prior examination. There are mild-to-moderate degenerative changes of right knee joint. No radiopaque foreign bodies. IMPRESSION: *No acute osseous abnormality of the pelvis or right femur. Electronically Signed   By: Jules Schick M.D.   On: 09/14/2023 09:19   CT Head Wo Contrast  Result Date: 09/14/2023 CLINICAL DATA:  Head and neck trauma.  Fall. EXAM: CT HEAD WITHOUT CONTRAST CT  CERVICAL SPINE WITHOUT CONTRAST TECHNIQUE: Multidetector CT imaging of the head and cervical spine was performed following the standard protocol without intravenous contrast. Multiplanar CT image reconstructions of the cervical spine were also generated. RADIATION DOSE REDUCTION: This exam was performed according to the departmental dose-optimization program which includes automated exposure control, adjustment of the mA and/or kV according to patient size and/or use of iterative reconstruction technique. COMPARISON:  06/02/2023 FINDINGS: CT HEAD FINDINGS Brain: No evidence of acute infarction, hemorrhage, hydrocephalus, extra-axial collection or mass lesion/mass effect. Cerebral volume loss with periventricular chronic small vessel ischemia, moderate. Vascular: No hyperdense vessel or unexpected calcification. Skull: Normal. Negative for fracture or focal lesion. Sinuses/Orbits: No acute finding. CT CERVICAL SPINE FINDINGS Alignment: Normal. Skull base and vertebrae: No acute fracture. No primary bone lesion or focal pathologic process. Soft tissues and spinal canal: No prevertebral fluid or swelling. No visible canal hematoma. Exophytic posterior right thyroid nodule stable since at least 2021. No follow-up recommended unless clinically warranted (ref: J Am Coll Radiol. 2015 Feb;12(2): 143-50). Disc levels:  Ordinary degenerative endplate and facet spurring. Upper chest: Negative. IMPRESSION: No evidence of acute intracranial or cervical spine injury. Electronically Signed   By: Tiburcio Pea M.D.   On: 09/14/2023 08:40   CT Cervical Spine Wo Contrast  Result Date: 09/14/2023 CLINICAL DATA:  Head and neck trauma.  Fall. EXAM: CT HEAD WITHOUT CONTRAST CT CERVICAL SPINE WITHOUT CONTRAST TECHNIQUE: Multidetector CT imaging of the head and cervical spine was performed following the standard protocol without intravenous contrast. Multiplanar CT image reconstructions of the cervical spine were also generated.  RADIATION DOSE REDUCTION: This exam was performed according to the departmental dose-optimization program which includes automated exposure control, adjustment of the mA and/or kV according to patient size and/or use of iterative reconstruction technique. COMPARISON:  06/02/2023 FINDINGS: CT HEAD FINDINGS Brain: No  evidence of acute infarction, hemorrhage, hydrocephalus, extra-axial collection or mass lesion/mass effect. Cerebral volume loss with periventricular chronic small vessel ischemia, moderate. Vascular: No hyperdense vessel or unexpected calcification. Skull: Normal. Negative for fracture or focal lesion. Sinuses/Orbits: No acute finding. CT CERVICAL SPINE FINDINGS Alignment: Normal. Skull base and vertebrae: No acute fracture. No primary bone lesion or focal pathologic process. Soft tissues and spinal canal: No prevertebral fluid or swelling. No visible canal hematoma. Exophytic posterior right thyroid nodule stable since at least 2021. No follow-up recommended unless clinically warranted (ref: J Am Coll Radiol. 2015 Feb;12(2): 143-50). Disc levels:  Ordinary degenerative endplate and facet spurring. Upper chest: Negative. IMPRESSION: No evidence of acute intracranial or cervical spine injury. Electronically Signed   By: Tiburcio Pea M.D.   On: 09/14/2023 08:40    Procedures Procedures    Medications Ordered in ED Medications  acetaminophen (TYLENOL) tablet 650 mg (has no administration in time range)    ED Course/ Medical Decision Making/ A&P                                 Medical Decision Making This patient presents to the ED for concern of fall with vomiting, this involves an extensive number of treatment options, and is a complaint that carries with it a high risk of complications and morbidity.  The differential diagnosis includes MSK injury, weakness, CVA, infection   Co morbidities that complicate the patient evaluation  Dementia, HLD   Additional history  obtained:  Additional history and/or information obtained from chart review, notable for Sister - cannot add much additional history with regard to fall. Does state she is at her baseline mental status. Tremor is chronic.    Lab Tests:  I Ordered, and personally interpreted labs.  The pertinent results include:  No evidence infection - normal urine, mild leukocytosis of 11.3 felt likely reactive.    Imaging Studies ordered:  I ordered imaging studies including Head and cervical CT; bilateral hips/pelvis and right femur Radiologist interpretation: negative for acute injury   Test Considered:  Ambulation after negative xrays, CT's Patient able to get up to sitting with minimal assistance. Stands with full weight bearing. Able to take balanced steps without limitation.     Consultations Obtained:  I requested consultation with the ED attending, Dr. Rosalia Hammers, who has seen and evaluated the patient,  and discussed lab and imaging findings as well as pertinent plan - they recommend: discharge to facility   Problem List / ED Course:  Patient with dementia at [facility], increase in recent falls. Found this morning after unwitnessed fall with emesis x 1   Reevaluation:  After the interventions noted above, I reevaluated the patient and found that they have :improved   Social Determinants of Health:  Nursing home resident H/o dementia   Disposition:  After consideration of the diagnostic results and the patients response to treatment, I feel that the patient would benefit from Can discharge to home. Sister comfortable with transport back to [facility].   Amount and/or Complexity of Data Reviewed Labs: ordered. Radiology: ordered.  Risk OTC drugs.           Final Clinical Impression(s) / ED Diagnoses Final diagnoses:  Fall, initial encounter    Rx / DC Orders ED Discharge Orders     None         Elpidio Anis, PA-C 09/14/23 9629    Margarita Grizzle,  MD 09/15/23 [phone]

## 2023-09-14 NOTE — ED Notes (Signed)
Patient changed into gown, depend changed and pericare done.

## 2023-09-14 NOTE — Discharge Instructions (Signed)
Continue Tylenol for pain as needed. Walk only with the use of your walker and with assistance.   Return here with any new or concerning symptoms at any time.

## 2023-09-14 NOTE — ED Triage Notes (Signed)
Pt to Ed by EMS from the [place] at [facility] following a mechanical fall. Pt was ambulating to the bathroom when she fell, no LOC or thinners. Pt initially complained to EMS that she had bilateral hip pain, however upon arrival to ED pt's only complaint is back pain. VSS, NADN.

## 2023-09-17 ENCOUNTER — Encounter (INDEPENDENT_AMBULATORY_CARE_PROVIDER_SITE_OTHER): Payer: Self-pay | Admitting: Otolaryngology

## 2023-09-19 DIAGNOSIS — B351 Tinea unguium: Secondary | ICD-10-CM | POA: Diagnosis not present

## 2023-09-19 DIAGNOSIS — M79674 Pain in right toe(s): Secondary | ICD-10-CM | POA: Diagnosis not present

## 2023-09-19 DIAGNOSIS — M79675 Pain in left toe(s): Secondary | ICD-10-CM | POA: Diagnosis not present

## 2023-09-21 DIAGNOSIS — E782 Mixed hyperlipidemia: Secondary | ICD-10-CM | POA: Diagnosis not present

## 2023-09-21 DIAGNOSIS — I1310 Hypertensive heart and chronic kidney disease without heart failure, with stage 1 through stage 4 chronic kidney disease, or unspecified chronic kidney disease: Secondary | ICD-10-CM | POA: Diagnosis not present

## 2023-09-21 DIAGNOSIS — E119 Type 2 diabetes mellitus without complications: Secondary | ICD-10-CM | POA: Diagnosis not present

## 2023-09-21 DIAGNOSIS — E039 Hypothyroidism, unspecified: Secondary | ICD-10-CM | POA: Diagnosis not present

## 2023-09-21 DIAGNOSIS — G309 Alzheimer's disease, unspecified: Secondary | ICD-10-CM | POA: Diagnosis not present

## 2023-09-24 DIAGNOSIS — F411 Generalized anxiety disorder: Secondary | ICD-10-CM | POA: Diagnosis not present

## 2023-09-24 DIAGNOSIS — F5105 Insomnia due to other mental disorder: Secondary | ICD-10-CM | POA: Diagnosis not present

## 2023-09-24 DIAGNOSIS — F331 Major depressive disorder, recurrent, moderate: Secondary | ICD-10-CM | POA: Diagnosis not present

## 2023-09-24 DIAGNOSIS — G301 Alzheimer's disease with late onset: Secondary | ICD-10-CM | POA: Diagnosis not present

## 2023-09-25 DIAGNOSIS — E782 Mixed hyperlipidemia: Secondary | ICD-10-CM | POA: Diagnosis not present

## 2023-09-25 DIAGNOSIS — I1310 Hypertensive heart and chronic kidney disease without heart failure, with stage 1 through stage 4 chronic kidney disease, or unspecified chronic kidney disease: Secondary | ICD-10-CM | POA: Diagnosis not present

## 2023-09-25 DIAGNOSIS — E039 Hypothyroidism, unspecified: Secondary | ICD-10-CM | POA: Diagnosis not present

## 2023-09-25 DIAGNOSIS — E876 Hypokalemia: Secondary | ICD-10-CM | POA: Diagnosis not present

## 2023-09-25 DIAGNOSIS — E559 Vitamin D deficiency, unspecified: Secondary | ICD-10-CM | POA: Diagnosis not present

## 2023-10-03 DIAGNOSIS — E559 Vitamin D deficiency, unspecified: Secondary | ICD-10-CM | POA: Diagnosis not present

## 2023-10-03 DIAGNOSIS — E039 Hypothyroidism, unspecified: Secondary | ICD-10-CM | POA: Diagnosis not present

## 2023-10-03 DIAGNOSIS — I1310 Hypertensive heart and chronic kidney disease without heart failure, with stage 1 through stage 4 chronic kidney disease, or unspecified chronic kidney disease: Secondary | ICD-10-CM | POA: Diagnosis not present

## 2023-10-03 DIAGNOSIS — G4700 Insomnia, unspecified: Secondary | ICD-10-CM | POA: Diagnosis not present

## 2023-10-03 DIAGNOSIS — R600 Localized edema: Secondary | ICD-10-CM | POA: Diagnosis not present

## 2023-10-03 DIAGNOSIS — E782 Mixed hyperlipidemia: Secondary | ICD-10-CM | POA: Diagnosis not present

## 2023-10-03 DIAGNOSIS — E876 Hypokalemia: Secondary | ICD-10-CM | POA: Diagnosis not present

## 2023-10-03 DIAGNOSIS — E119 Type 2 diabetes mellitus without complications: Secondary | ICD-10-CM | POA: Diagnosis not present

## 2023-10-03 DIAGNOSIS — D519 Vitamin B12 deficiency anemia, unspecified: Secondary | ICD-10-CM | POA: Diagnosis not present

## 2023-10-15 DIAGNOSIS — F411 Generalized anxiety disorder: Secondary | ICD-10-CM | POA: Diagnosis not present

## 2023-10-15 DIAGNOSIS — G309 Alzheimer's disease, unspecified: Secondary | ICD-10-CM | POA: Diagnosis not present

## 2023-10-15 DIAGNOSIS — E785 Hyperlipidemia, unspecified: Secondary | ICD-10-CM | POA: Diagnosis not present

## 2023-10-15 DIAGNOSIS — I10 Essential (primary) hypertension: Secondary | ICD-10-CM | POA: Diagnosis not present

## 2023-10-23 ENCOUNTER — Ambulatory Visit: Payer: BC Managed Care – PPO | Admitting: Physician Assistant

## 2023-10-31 ENCOUNTER — Encounter: Payer: Self-pay | Admitting: Physician Assistant

## 2023-10-31 ENCOUNTER — Ambulatory Visit (INDEPENDENT_AMBULATORY_CARE_PROVIDER_SITE_OTHER): Payer: Medicare Other | Admitting: Physician Assistant

## 2023-10-31 VITALS — BP 130/67 | HR 90 | Resp 20 | Ht 59.0 in | Wt 150.0 lb

## 2023-10-31 DIAGNOSIS — F03918 Unspecified dementia, unspecified severity, with other behavioral disturbance: Secondary | ICD-10-CM

## 2023-10-31 NOTE — Progress Notes (Signed)
 Assessment/Plan:   Dementia likely due to Alzheimer's disease with behavioral disturbance  Felicia Kelly is a very pleasant 88 y.o. RH female with a history of hyperlipidemia, hypertension, mild carotid artery disease, osteoporosis, history of pulmonary nodules, hypothyroidism and a history of dementia likely due to Alzheimer's disease with behavioral disturbance seen today in follow up for memory loss. Patient is currently on rivastigmine  6 mg twice daily.  For mood she is on Depakote  125 mg twice daily, tolerating well.  Patient currently resides in memory care facility, enjoying the activities offered there.  Cognitive decline is noted.  He is less verbal than prior.    Follow up in 6 months. Recommend good control of her cardiovascular risk factors Continue to control mood as per PCP Continue 24/7 monitoring for safety patient lives at [facility] memory care    Subjective:    This patient is accompanied in the office by her sister who supplements the history.  Previous records as well as any outside records available were reviewed prior to todays visit. Patient was last seen on 03/21/2023    Any changes in memory since last visit? "  Her sister reports that her memory is worse, less conversant.  At [facility] memory care for the last year and enjoys the activities offered there, not as much as before, she prefers to be in her room.  Recognizes her family and friends.  She has some repetitive behaviors such as picking her skin or her lip.   Repeats oneself? Denies Disoriented when walking into a room?  Patient denies    Leaving objects?  May misplace things such as the phone in the room and is yet to be found.   Wandering behavior?  Denies.   Any personality changes since last visit?  Denies.   Any worsening depression?:  Denies.   Hallucinations or paranoia?  Chronic paranoia about having things stolen from her. Since Depakote  she has less sundowners.   Seizures?  Denies.    Any sleep changes?  Sleeps well, "a lot".  Denies vivid dreams, REM behavior or sleepwalking   Sleep apnea?   Denies.   Any hygiene concerns? She requires assistance at memory care, "does not enjoy showering".  Independent of bathing and dressing?  Endorsed  Does the patient needs help with medications?  Facility is in charge   Who is in charge of the finances?  Sister is in charge     Any changes in appetite?  She has decreased appetite and muscle wasting . She is picky with her food    Patient have trouble swallowing? Denies.   Does the patient cook? No Any headaches?   denies   Chronic back pain, "endorsed, since she is staying in bed more" Ambulates with difficulty?  She uses a walker for stability,  no longer doing PT OT and ST at the facility for strength and balance, as she was not making any progress..  Recent falls or head injuries? She has a mechanical fall in the bathroom, no fractures, no LOC or head injury  Unilateral weakness, numbness or tingling? Denies.   Any tremors?  Denies   Any anosmia?  Denies   Any incontinence of urine?  Endorsed, wears diapers.  She has increased cystitis and recurrent UTIs, closely monitor at the facility. Any bowel dysfunction?   Denies      Patient lives at [facility] memory care  Does the patient drive? No longer drives    " History of  Present Illness 02/14/2019:  This is an 88 year old right-handed woman with a history of hypertension, hyperlipidemia, hypothyroidism, presenting for evaluation of worsening memory. Memory concerns were raised by family, patient has no insight into her condition and was upset throughout most of the visit (but participated). She states "I forget things, but if you are asking if I have dementia, I don't." She thinks her memory is good. Family started noticing memory changes for several years, worsening recently where she is very forgetful, repeating things constantly. They got 10 Christmas cards from her  because she did not remember sending them. Felicia Kelly reports "she has trouble admitting she has issues." She has been living alone for the past 9 years since her husband passed away. She manages finances and medications and denies any difficulties doing these, although Felicia Kelly reports that they had to help her last March because she could not get her taxes together, she seemed to have trouble keeping up with things. She continues to drive and denies getting lost. Felicia Kelly reports she misplaces things frequently (she denies this). Felicia Kelly has noticed more irritability since they approached her about memory concerns last March, no paranoia or hallucinations. She is independent with dressing and bathing, no hygiene concerns, house is well-kept. No family history of dementia, no history of significant head injuries or alcohol use.   She denies any headaches, dizziness, diplopia, dysarthria/dysphagia, neck/back pain, focal numbness/tingling/weakness, bowel/bladder dysfunction, anosmia, or tremors. Sleep is good. She denies any falls."    PREVIOUS MEDICATIONS: Donezepil d/c 05/21/2019  CURRENT MEDICATIONS:  Outpatient Encounter Medications as of 10/31/2023  Medication Sig   amLODipine  (NORVASC ) 5 MG tablet TAKE ONE TABLET BY MOUTH AT BEDTIME   aspirin  EC 81 MG tablet Take 81 mg by mouth every evening. Swallow whole.   Cholecalciferol  (VITAMIN D -3) 25 MCG (1000 UT) CAPS Take 1,000 Units by mouth every Monday, Wednesday, and Friday.   cyanocobalamin  100 MCG tablet Take 100 mcg by mouth daily.   Dextromethorphan-guaiFENesin  (CORICIDIN HBP CONGESTION/COUGH PO) Take by mouth. As needed   divalproex  (DEPAKOTE ) 125 MG DR tablet Take 1 tablet (125 mg total) by mouth 2 (two) times daily.   ezetimibe  (ZETIA ) 10 MG tablet Take 1 tablet (10 mg total) by mouth daily.   levothyroxine  (SYNTHROID ) 50 MCG tablet Take 1 tablet by mouth 6 days a week, then take 2 tablets on Sundays. 30 days   Magnesium  250 MG TABS Take 250 mg by  mouth daily.   melatonin 1 MG TABS tablet Take 1 mg by mouth at bedtime.   METAMUCIL FIBER PO Take 1 capsule by mouth every evening.   metFORMIN  (GLUCOPHAGE -XR) 500 MG 24 hr tablet Take 1 tablet (500 mg total) by mouth daily with breakfast.   mirtazapine  (REMERON ) 30 MG tablet TAKE 1 TABLET BY MOUTH DAILY AT BEDTIME **NO REFILLS**   Multiple Vitamin (MULTIVITAMIN) tablet Take 1 tablet by mouth daily with breakfast.   Multiple Vitamins-Minerals (OCUVITE EYE HEALTH FORMULA) CAPS Take 1 capsule by mouth in the morning.   Nutritional Supplements (ENSURE ORIGINAL) LIQD Take by mouth. 1 per day   potassium chloride  (KLOR-CON  M) 10 MEQ tablet Take 1 tablet (10 mEq total) by mouth daily.   pravastatin  (PRAVACHOL ) 20 MG tablet Take 20 mg by mouth daily.   Probiotic Product (PROBIOTIC PO) Take 1 capsule by mouth every evening.   rivastigmine  (EXELON ) 6 MG capsule Take 1 capsule (6 mg total) by mouth 2 (two) times daily.   valACYclovir  (VALTREX ) 500 MG tablet Take 1  tablet (500 mg total) by mouth daily.   No facility-administered encounter medications on file as of 10/31/2023.       02/27/2022    1:00 PM 01/11/2021    2:00 PM 05/14/2018   11:41 AM  MMSE - Mini Mental State Exam  Orientation to time 0 5 5  Orientation to Place 5 5 5   Registration 3 3 3   Attention/ Calculation 4 5 4   Recall 0 1 2  Language- name 2 objects 2 2 2   Language- repeat 1 1 1   Language- follow 3 step command 2 3 3   Language- read & follow direction 1 1 1   Write a sentence 1 1 1   Copy design 0 0 1  Total score 19 27 28       02/14/2019    3:00 PM  Montreal Cognitive Assessment   Visuospatial/ Executive (0/5) 0  Naming (0/3) 0  Attention: Read list of digits (0/2) 2  Attention: Read list of letters (0/1) 0  Attention: Serial 7 subtraction starting at 100 (0/3) 2  Language: Repeat phrase (0/2) 2  Language : Fluency (0/1) 0  Abstraction (0/2) 1  Delayed Recall (0/5) 5  Orientation (0/6) 4  Total 16  Adjusted Score  (based on education) 17    Objective:     PHYSICAL EXAMINATION:    VITALS:   Vitals:   10/31/23 1448  BP: 130/67  Pulse: 90  Resp: 20  SpO2: 99%  Weight: 150 lb (68 kg)  Height: 4\' 11"  (1.499 m)    GEN:  The patient appears stated age and is in NAD. HEENT:  Normocephalic, atraumatic.   Neurological examination:  General: NAD, well-groomed, appears stated age. Orientation: The patient is alert. Oriented to person, not to place and date Cranial nerves: There is good facial symmetry.  The speech is not fluent but clear. No aphasia or dysarthria. Fund of knowledge is reduced. Recent and remote memory are impaired. Attention and concentration are reduced.  Unable to name objects and repeat phrases.  Hearing is intact to conversational tone. Sensation: Sensation is intact to light touch throughout Motor: Strength is at least antigravity x4. DTR's 2/4 in UE/LE     Movement examination: Tone: There is normal tone in the UE/LE Abnormal movements:  no tremor.  No myoclonus.  No asterixis.   Coordination:  There is no decremation with RAM's. Normal finger to nose  Gait and Station: The patient has  difficulty arising out of a deep-seated chair without the use of the hands.  Uses a walker for stability.  The patient's stride length is  short.  Gait is cautious and narrow.    Thank you for allowing us  the opportunity to participate in the care of this nice patient. Please do not hesitate to contact us  for any questions or concerns.   Total time spent on today's visit was 26 minutes dedicated to this patient today, preparing to see patient, examining the patient, ordering tests and/or medications and counseling the patient, documenting clinical information in the EHR or other health record, independently interpreting results and communicating results to the patient/family, discussing treatment and goals, answering patient's questions and coordinating care.  Cc:  Pcp, No  Tex Filbert 10/31/2023 3:09 PM

## 2023-10-31 NOTE — Patient Instructions (Signed)
 Good to see you!  Rivastigmine  to  6 mg twice a day  control of blood pressure, cholesterol, sugar levels  Continue Depakote  125 mg night,  2 times a day   3. Follow-up in 6 months   FALL PRECAUTIONS: Be cautious when walking. Scan the area for obstacles that may increase the risk of trips and falls. When getting up in the mornings, sit up at the edge of the bed for a few minutes before getting out of bed. Consider elevating the bed at the head end to avoid drop of blood pressure when getting up. Walk always in a well-lit room (use night lights in the walls). Avoid area rugs or power cords from appliances in the middle of the walkways. Use a walker or a cane if necessary and consider physical therapy for balance exercise. Get your eyesight checked regularly.   HOME SAFETY: Consider the safety of the kitchen when operating appliances like stoves, microwave oven, and blender. Consider having supervision and share cooking responsibilities until no longer able to participate in those. Accidents with firearms and other hazards in the house should be identified and addressed as well.   ABILITY TO BE LEFT ALONE: If patient is unable to contact 911 operator, consider using LifeLine, or when the need is there, arrange for someone to stay with patients. Smoking is a fire hazard, consider supervision or cessation. Risk of wandering should be assessed by caregiver and if detected at any point, supervision and safe proof recommendations should be instituted.  RECOMMENDATIONS FOR ALL PATIENTS WITH MEMORY PROBLEMS: 1. Continue to exercise (Recommend 30 minutes of walking everyday, or 3 hours every week) 2. Increase social interactions - continue going to [place] and enjoy social gatherings with friends and family 3. Eat healthy, avoid fried foods and eat more fruits and vegetables 4. Maintain adequate blood pressure, blood sugar, and blood cholesterol level. Reducing the risk of stroke and cardiovascular  disease also helps promoting better memory. 5. Avoid stressful situations. Live a simple life and avoid aggravations. Organize your time and prepare for the next day in anticipation. 6. Sleep well, avoid any interruptions of sleep and avoid any distractions in the bedroom that may interfere with adequate sleep quality 7. Avoid sugar, avoid sweets as there is a strong link between excessive sugar intake, diabetes, and cognitive impairment We discussed the Mediterranean diet, which has been shown to help patients reduce the risk of progressive memory disorders and reduces cardiovascular risk. This includes eating fish, eat fruits and green leafy vegetables, nuts like almonds and hazelnuts, walnuts, and also use olive oil. Avoid fast foods and fried foods as much as possible. Avoid sweets and sugar as sugar use has been linked to worsening of memory function.  There is always a concern of gradual progression of memory problems. If this is the case, then we may need to adjust level of care according to patient needs. Support, both to the patient and caregiver, should then be put into place.

## 2023-11-09 ENCOUNTER — Other Ambulatory Visit: Payer: Self-pay | Admitting: Family Medicine

## 2023-11-09 DIAGNOSIS — I10 Essential (primary) hypertension: Secondary | ICD-10-CM

## 2023-11-10 ENCOUNTER — Inpatient Hospital Stay (HOSPITAL_COMMUNITY)
Admission: EM | Admit: 2023-11-10 | Discharge: 2023-11-13 | DRG: 690 | Disposition: A | Discharge status: Home Health | Payer: Medicare Other | Source: Skilled Nursing Facility | Attending: Internal Medicine | Admitting: Internal Medicine

## 2023-11-10 ENCOUNTER — Other Ambulatory Visit: Payer: Self-pay

## 2023-11-10 ENCOUNTER — Emergency Department (HOSPITAL_COMMUNITY): Payer: Medicare Other

## 2023-11-10 ENCOUNTER — Encounter (HOSPITAL_COMMUNITY): Payer: Self-pay

## 2023-11-10 DIAGNOSIS — Z880 Allergy status to penicillin: Secondary | ICD-10-CM

## 2023-11-10 DIAGNOSIS — Z860100 Personal history of colon polyps, unspecified: Secondary | ICD-10-CM

## 2023-11-10 DIAGNOSIS — N3090 Cystitis, unspecified without hematuria: Secondary | ICD-10-CM | POA: Diagnosis not present

## 2023-11-10 DIAGNOSIS — I2510 Atherosclerotic heart disease of native coronary artery without angina pectoris: Secondary | ICD-10-CM | POA: Diagnosis present

## 2023-11-10 DIAGNOSIS — Z881 Allergy status to other antibiotic agents status: Secondary | ICD-10-CM

## 2023-11-10 DIAGNOSIS — R5381 Other malaise: Secondary | ICD-10-CM | POA: Diagnosis present

## 2023-11-10 DIAGNOSIS — F0394 Unspecified dementia, unspecified severity, with anxiety: Secondary | ICD-10-CM | POA: Diagnosis present

## 2023-11-10 DIAGNOSIS — F03918 Unspecified dementia, unspecified severity, with other behavioral disturbance: Secondary | ICD-10-CM | POA: Diagnosis present

## 2023-11-10 DIAGNOSIS — N390 Urinary tract infection, site not specified: Secondary | ICD-10-CM | POA: Diagnosis present

## 2023-11-10 DIAGNOSIS — Z90710 Acquired absence of both cervix and uterus: Secondary | ICD-10-CM

## 2023-11-10 DIAGNOSIS — E039 Hypothyroidism, unspecified: Secondary | ICD-10-CM | POA: Diagnosis present

## 2023-11-10 DIAGNOSIS — F0393 Unspecified dementia, unspecified severity, with mood disturbance: Secondary | ICD-10-CM | POA: Diagnosis present

## 2023-11-10 DIAGNOSIS — B961 Klebsiella pneumoniae [K. pneumoniae] as the cause of diseases classified elsewhere: Secondary | ICD-10-CM | POA: Diagnosis present

## 2023-11-10 DIAGNOSIS — Z96643 Presence of artificial hip joint, bilateral: Secondary | ICD-10-CM | POA: Diagnosis present

## 2023-11-10 DIAGNOSIS — Z90722 Acquired absence of ovaries, bilateral: Secondary | ICD-10-CM

## 2023-11-10 DIAGNOSIS — Z833 Family history of diabetes mellitus: Secondary | ICD-10-CM

## 2023-11-10 DIAGNOSIS — R7401 Elevation of levels of liver transaminase levels: Secondary | ICD-10-CM | POA: Diagnosis present

## 2023-11-10 DIAGNOSIS — R823 Hemoglobinuria: Secondary | ICD-10-CM | POA: Diagnosis present

## 2023-11-10 DIAGNOSIS — E119 Type 2 diabetes mellitus without complications: Secondary | ICD-10-CM | POA: Diagnosis present

## 2023-11-10 DIAGNOSIS — G4700 Insomnia, unspecified: Secondary | ICD-10-CM | POA: Diagnosis present

## 2023-11-10 DIAGNOSIS — E876 Hypokalemia: Secondary | ICD-10-CM | POA: Diagnosis present

## 2023-11-10 DIAGNOSIS — E785 Hyperlipidemia, unspecified: Secondary | ICD-10-CM | POA: Diagnosis present

## 2023-11-10 DIAGNOSIS — I10 Essential (primary) hypertension: Secondary | ICD-10-CM | POA: Diagnosis present

## 2023-11-10 DIAGNOSIS — Z79899 Other long term (current) drug therapy: Secondary | ICD-10-CM

## 2023-11-10 DIAGNOSIS — M1990 Unspecified osteoarthritis, unspecified site: Secondary | ICD-10-CM | POA: Diagnosis present

## 2023-11-10 DIAGNOSIS — Z8249 Family history of ischemic heart disease and other diseases of the circulatory system: Secondary | ICD-10-CM

## 2023-11-10 DIAGNOSIS — R531 Weakness: Secondary | ICD-10-CM | POA: Diagnosis not present

## 2023-11-10 DIAGNOSIS — F32A Depression, unspecified: Secondary | ICD-10-CM | POA: Diagnosis present

## 2023-11-10 DIAGNOSIS — Z888 Allergy status to other drugs, medicaments and biological substances status: Secondary | ICD-10-CM

## 2023-11-10 DIAGNOSIS — Z79890 Hormone replacement therapy (postmenopausal): Secondary | ICD-10-CM

## 2023-11-10 DIAGNOSIS — I700 Atherosclerosis of aorta: Secondary | ICD-10-CM | POA: Diagnosis present

## 2023-11-10 DIAGNOSIS — Z87891 Personal history of nicotine dependence: Secondary | ICD-10-CM

## 2023-11-10 DIAGNOSIS — Z7984 Long term (current) use of oral hypoglycemic drugs: Secondary | ICD-10-CM

## 2023-11-10 DIAGNOSIS — Z800 Family history of malignant neoplasm of digestive organs: Secondary | ICD-10-CM

## 2023-11-10 DIAGNOSIS — R809 Proteinuria, unspecified: Secondary | ICD-10-CM | POA: Diagnosis present

## 2023-11-10 DIAGNOSIS — Z7982 Long term (current) use of aspirin: Secondary | ICD-10-CM

## 2023-11-10 DIAGNOSIS — B964 Proteus (mirabilis) (morganii) as the cause of diseases classified elsewhere: Secondary | ICD-10-CM | POA: Diagnosis present

## 2023-11-10 DIAGNOSIS — Z66 Do not resuscitate: Secondary | ICD-10-CM | POA: Diagnosis present

## 2023-11-10 DIAGNOSIS — Z8261 Family history of arthritis: Secondary | ICD-10-CM

## 2023-11-10 DIAGNOSIS — Z83438 Family history of other disorder of lipoprotein metabolism and other lipidemia: Secondary | ICD-10-CM

## 2023-11-10 DIAGNOSIS — Z1152 Encounter for screening for COVID-19: Secondary | ICD-10-CM

## 2023-11-10 DIAGNOSIS — N3001 Acute cystitis with hematuria: Secondary | ICD-10-CM | POA: Diagnosis not present

## 2023-11-10 DIAGNOSIS — M810 Age-related osteoporosis without current pathological fracture: Secondary | ICD-10-CM | POA: Diagnosis present

## 2023-11-10 DIAGNOSIS — N3000 Acute cystitis without hematuria: Secondary | ICD-10-CM

## 2023-11-10 LAB — URINALYSIS, W/ REFLEX TO CULTURE (INFECTION SUSPECTED)
Bilirubin Urine: NEGATIVE
Glucose, UA: NEGATIVE mg/dL
Ketones, ur: NEGATIVE mg/dL
Nitrite: NEGATIVE
Protein, ur: >=300 mg/dL — AB
Specific Gravity, Urine: 1.016 (ref 1.005–1.030)
WBC, UA: >50 WBC/hpf (ref 0–5)
pH: 6 (ref 5.0–8.0)

## 2023-11-10 LAB — COMPREHENSIVE METABOLIC PANEL
ALT: 113 U/L — ABNORMAL HIGH (ref 0–44)
AST: 120 U/L — ABNORMAL HIGH (ref 15–41)
Albumin: 2.9 g/dL — ABNORMAL LOW (ref 3.5–5.0)
Alkaline Phosphatase: 91 U/L (ref 38–126)
Anion gap: 10 (ref 5–15)
BUN: 23 mg/dL (ref 8–23)
CO2: 26 mmol/L (ref 22–32)
Calcium: 8.8 mg/dL — ABNORMAL LOW (ref 8.9–10.3)
Chloride: 102 mmol/L (ref 98–111)
Creatinine, Ser: 0.96 mg/dL (ref 0.44–1.00)
GFR, Estimated: 58 mL/min — ABNORMAL LOW (ref >60)
Glucose, Bld: 146 mg/dL — ABNORMAL HIGH (ref 70–99)
Potassium: 3.4 mmol/L — ABNORMAL LOW (ref 3.5–5.1)
Sodium: 138 mmol/L (ref 135–145)
Total Bilirubin: 0.6 mg/dL (ref 0.0–1.2)
Total Protein: 7.6 g/dL (ref 6.5–8.1)

## 2023-11-10 LAB — CBC WITH DIFFERENTIAL/PLATELET
Abs Immature Granulocytes: 0.05 10*3/uL (ref 0.00–0.07)
Basophils Absolute: 0 10*3/uL (ref 0.0–0.1)
Basophils Relative: 0 %
Eosinophils Absolute: 0.1 10*3/uL (ref 0.0–0.5)
Eosinophils Relative: 1 %
HCT: 37.2 % (ref 36.0–46.0)
Hemoglobin: 11.6 g/dL — ABNORMAL LOW (ref 12.0–15.0)
Immature Granulocytes: 1 %
Lymphocytes Relative: 13 %
Lymphs Abs: 1.4 10*3/uL (ref 0.7–4.0)
MCH: 28.3 pg (ref 26.0–34.0)
MCHC: 31.2 g/dL (ref 30.0–36.0)
MCV: 90.7 fL (ref 80.0–100.0)
Monocytes Absolute: 0.8 10*3/uL (ref 0.1–1.0)
Monocytes Relative: 7 %
Neutro Abs: 8.6 10*3/uL — ABNORMAL HIGH (ref 1.7–7.7)
Neutrophils Relative %: 78 %
Platelets: 336 10*3/uL (ref 150–400)
RBC: 4.1 MIL/uL (ref 3.87–5.11)
RDW: 13.5 % (ref 11.5–15.5)
WBC: 10.9 10*3/uL — ABNORMAL HIGH (ref 4.0–10.5)
nRBC: 0 % (ref 0.0–0.2)

## 2023-11-10 LAB — GLUCOSE, CAPILLARY
Glucose-Capillary: 108 mg/dL — ABNORMAL HIGH (ref 70–99)
Glucose-Capillary: 109 mg/dL — ABNORMAL HIGH (ref 70–99)

## 2023-11-10 LAB — TROPONIN I (HIGH SENSITIVITY)
Troponin I (High Sensitivity): 6 ng/L (ref <18)
Troponin I (High Sensitivity): 5 ng/L (ref <18)

## 2023-11-10 LAB — CBG MONITORING, ED: Glucose-Capillary: 71 mg/dL (ref 70–99)

## 2023-11-10 LAB — MAGNESIUM: Magnesium: 2 mg/dL (ref 1.7–2.4)

## 2023-11-10 LAB — TSH: TSH: 5.132 u[IU]/mL — ABNORMAL HIGH (ref 0.350–4.500)

## 2023-11-10 LAB — RESP PANEL BY RT-PCR (RSV, FLU A&B, COVID)  RVPGX2
Influenza A by PCR: NEGATIVE
Influenza B by PCR: NEGATIVE
Resp Syncytial Virus by PCR: NEGATIVE
SARS Coronavirus 2 by RT PCR: NEGATIVE

## 2023-11-10 LAB — I-STAT CG4 LACTIC ACID, ED: Lactic Acid, Venous: 1.6 mmol/L (ref 0.5–1.9)

## 2023-11-10 LAB — PHOSPHORUS: Phosphorus: 3.3 mg/dL (ref 2.5–4.6)

## 2023-11-10 LAB — VITAMIN B12: Vitamin B-12: 1442 pg/mL — ABNORMAL HIGH (ref 180–914)

## 2023-11-10 MED ORDER — EZETIMIBE 10 MG PO TABS
10.0000 mg | ORAL_TABLET | Freq: Every day | ORAL | Status: DC
  Administered 2023-11-11 – 2023-11-13 (×3): 10 mg via ORAL
  Filled 2023-11-10 (×3): qty 1

## 2023-11-10 MED ORDER — POTASSIUM CHLORIDE CRYS ER 10 MEQ PO TBCR: 10.0000 meq | EXTENDED_RELEASE_TABLET | Freq: Every day | ORAL | Status: DC

## 2023-11-10 MED ORDER — INSULIN ASPART 100 UNIT/ML IJ SOLN
0.0000 [IU] | Freq: Three times a day (TID) | INTRAMUSCULAR | Status: DC
  Filled 2023-11-10: qty 0.15

## 2023-11-10 MED ORDER — ONDANSETRON HCL 4 MG/2ML IJ SOLN: 4.0000 mg | Freq: Four times a day (QID) | INTRAMUSCULAR | Status: DC | PRN

## 2023-11-10 MED ORDER — LEVOTHYROXINE SODIUM 50 MCG PO TABS
100.0000 ug | ORAL_TABLET | ORAL | Status: DC
  Administered 2023-11-11: 100 ug via ORAL
  Filled 2023-11-10: qty 2

## 2023-11-10 MED ORDER — FUROSEMIDE 20 MG PO TABS
20.0000 mg | ORAL_TABLET | Freq: Every day | ORAL | Status: DC
Start: 2023-11-11 — End: 2023-11-13
  Administered 2023-11-11 – 2023-11-13 (×3): 20 mg via ORAL
  Filled 2023-11-10 (×3): qty 1

## 2023-11-10 MED ORDER — ACETAMINOPHEN 650 MG RE SUPP: 650.0000 mg | Freq: Four times a day (QID) | RECTAL | Status: DC | PRN

## 2023-11-10 MED ORDER — SODIUM CHLORIDE 0.9 % IV SOLN
1.0000 g | Freq: Once | INTRAVENOUS | Status: AC
  Administered 2023-11-10: 1 g via INTRAVENOUS
  Filled 2023-11-10: qty 10

## 2023-11-10 MED ORDER — AMLODIPINE BESYLATE 5 MG PO TABS
5.0000 mg | ORAL_TABLET | Freq: Every day | ORAL | Status: DC
  Administered 2023-11-10 – 2023-11-12 (×3): 5 mg via ORAL
  Filled 2023-11-10 (×3): qty 1

## 2023-11-10 MED ORDER — DIVALPROEX SODIUM 125 MG PO DR TAB
125.0000 mg | DELAYED_RELEASE_TABLET | Freq: Two times a day (BID) | ORAL | Status: DC
  Administered 2023-11-10 – 2023-11-13 (×6): 125 mg via ORAL
  Filled 2023-11-10 (×7): qty 1

## 2023-11-10 MED ORDER — ENOXAPARIN SODIUM 40 MG/0.4ML IJ SOSY
40.0000 mg | PREFILLED_SYRINGE | INTRAMUSCULAR | Status: DC
  Administered 2023-11-10 – 2023-11-12 (×3): 40 mg via SUBCUTANEOUS
  Filled 2023-11-10 (×3): qty 0.4

## 2023-11-10 MED ORDER — RISAQUAD PO CAPS
1.0000 | ORAL_CAPSULE | Freq: Every day | ORAL | Status: DC
  Administered 2023-11-10 – 2023-11-12 (×3): 1 via ORAL
  Filled 2023-11-10 (×3): qty 1

## 2023-11-10 MED ORDER — SODIUM CHLORIDE 0.9 % IV BOLUS
1000.0000 mL | Freq: Once | INTRAVENOUS | Status: AC
  Administered 2023-11-10: 1000 mL via INTRAVENOUS

## 2023-11-10 MED ORDER — ACETAMINOPHEN 325 MG PO TABS
650.0000 mg | ORAL_TABLET | Freq: Four times a day (QID) | ORAL | Status: DC | PRN
  Administered 2023-11-10 – 2023-11-11 (×2): 650 mg via ORAL
  Filled 2023-11-10 (×2): qty 2

## 2023-11-10 MED ORDER — SENNOSIDES-DOCUSATE SODIUM 8.6-50 MG PO TABS: 1.0000 | ORAL_TABLET | Freq: Every evening | ORAL | Status: DC | PRN

## 2023-11-10 MED ORDER — PSYLLIUM 0.52 G PO CAPS: 0.5200 g | ORAL_CAPSULE | Freq: Every evening | ORAL | Status: DC

## 2023-11-10 MED ORDER — MIRTAZAPINE 15 MG PO TABS
30.0000 mg | ORAL_TABLET | Freq: Every day | ORAL | Status: DC
  Administered 2023-11-10 – 2023-11-12 (×3): 30 mg via ORAL
  Filled 2023-11-10 (×3): qty 2

## 2023-11-10 MED ORDER — ENSURE MAX PROTEIN PO LIQD
237.0000 mL | Freq: Every day | ORAL | Status: DC
Start: 2023-11-11 — End: 2023-11-13
  Administered 2023-11-11 – 2023-11-13 (×3): 237 mL via ORAL
  Filled 2023-11-10 (×3): qty 330

## 2023-11-10 MED ORDER — POTASSIUM CHLORIDE CRYS ER 20 MEQ PO TBCR
40.0000 meq | EXTENDED_RELEASE_TABLET | Freq: Once | ORAL | Status: AC
  Administered 2023-11-10: 40 meq via ORAL
  Filled 2023-11-10: qty 2

## 2023-11-10 MED ORDER — VITAMIN D 25 MCG (1000 UNIT) PO TABS
1000.0000 [IU] | ORAL_TABLET | ORAL | Status: DC
  Administered 2023-11-12: 1000 [IU] via ORAL
  Filled 2023-11-10 (×2): qty 1

## 2023-11-10 MED ORDER — VITAMIN B-12 100 MCG PO TABS
100.0000 ug | ORAL_TABLET | Freq: Every day | ORAL | Status: DC
  Administered 2023-11-11: 100 ug via ORAL
  Filled 2023-11-10: qty 1

## 2023-11-10 MED ORDER — POTASSIUM CHLORIDE CRYS ER 10 MEQ PO TBCR
10.0000 meq | EXTENDED_RELEASE_TABLET | Freq: Every day | ORAL | Status: DC
  Administered 2023-11-11 – 2023-11-13 (×3): 10 meq via ORAL
  Filled 2023-11-10 (×3): qty 1

## 2023-11-10 MED ORDER — INSULIN ASPART 100 UNIT/ML IJ SOLN
0.0000 [IU] | Freq: Every day | INTRAMUSCULAR | Status: DC
  Filled 2023-11-10: qty 0.05

## 2023-11-10 MED ORDER — VALACYCLOVIR HCL 500 MG PO TABS
500.0000 mg | ORAL_TABLET | Freq: Every day | ORAL | Status: DC
  Administered 2023-11-10 – 2023-11-13 (×4): 500 mg via ORAL
  Filled 2023-11-10 (×4): qty 1

## 2023-11-10 MED ORDER — MAGNESIUM 250 MG PO TABS
250.0000 mg | ORAL_TABLET | Freq: Every day | ORAL | Status: DC
Start: 2023-11-10 — End: 2023-11-10

## 2023-11-10 MED ORDER — ASPIRIN 81 MG PO TBEC
81.0000 mg | DELAYED_RELEASE_TABLET | Freq: Every day | ORAL | Status: DC
  Administered 2023-11-10 – 2023-11-12 (×3): 81 mg via ORAL
  Filled 2023-11-10 (×3): qty 1

## 2023-11-10 MED ORDER — ONDANSETRON HCL 4 MG PO TABS: 4.0000 mg | ORAL_TABLET | Freq: Four times a day (QID) | ORAL | Status: DC | PRN

## 2023-11-10 MED ORDER — LEVOTHYROXINE SODIUM 50 MCG PO TABS
50.0000 ug | ORAL_TABLET | ORAL | Status: DC
  Administered 2023-11-12 – 2023-11-13 (×2): 50 ug via ORAL
  Filled 2023-11-10 (×2): qty 1

## 2023-11-10 MED ORDER — MELATONIN 3 MG PO TABS
1.5000 mg | ORAL_TABLET | Freq: Every day | ORAL | Status: DC
  Administered 2023-11-10 – 2023-11-12 (×3): 1.5 mg via ORAL
  Filled 2023-11-10 (×3): qty 1

## 2023-11-10 MED ORDER — PRAVASTATIN SODIUM 20 MG PO TABS
20.0000 mg | ORAL_TABLET | Freq: Every day | ORAL | Status: DC
  Administered 2023-11-10 – 2023-11-11 (×2): 20 mg via ORAL
  Filled 2023-11-10 (×2): qty 1

## 2023-11-10 MED ORDER — MAGNESIUM OXIDE -MG SUPPLEMENT 400 (240 MG) MG PO TABS
200.0000 mg | ORAL_TABLET | Freq: Every day | ORAL | Status: DC
  Administered 2023-11-10 – 2023-11-13 (×4): 200 mg via ORAL
  Filled 2023-11-10 (×4): qty 1

## 2023-11-10 MED ORDER — RIVASTIGMINE TARTRATE 3 MG PO CAPS
6.0000 mg | ORAL_CAPSULE | Freq: Two times a day (BID) | ORAL | Status: DC
  Administered 2023-11-10 – 2023-11-13 (×6): 6 mg via ORAL
  Filled 2023-11-10 (×7): qty 2

## 2023-11-10 MED ORDER — INSULIN ASPART 100 UNIT/ML IJ SOLN: 0.0000 [IU] | INTRAMUSCULAR | Status: DC

## 2023-11-10 MED ORDER — ADULT MULTIVITAMIN W/MINERALS CH
1.0000 | ORAL_TABLET | Freq: Every day | ORAL | Status: DC
  Administered 2023-11-11 – 2023-11-13 (×3): 1 via ORAL
  Filled 2023-11-10 (×3): qty 1

## 2023-11-10 MED ORDER — PSYLLIUM 95 % PO PACK
1.0000 | PACK | Freq: Every day | ORAL | Status: DC
  Administered 2023-11-10 – 2023-11-11 (×2): 1 via ORAL
  Filled 2023-11-10 (×4): qty 1

## 2023-11-10 NOTE — ED Triage Notes (Signed)
Patient BIB EMS [facility] w/ exertion Chest pain today Productive cough today No o2 at baseline O2 today is 90% Room Air  101.1 temp "Strong UTI" odor per EMS Patient unable to provide history  Patient does walk at baseline, not walking now due  Patient diabetic, CBG with ems 218 RR 16

## 2023-11-10 NOTE — ED Notes (Signed)
Meal trays arrived, gave patient tray and encouraged her to eat due to her blood sugar dropping and her not eating anything all day. Patient only interested in the peaches and ate them.

## 2023-11-10 NOTE — H&P (Signed)
History and Physical  Chairty Toman [place] WUJ:[id number] DOB: [birth year] DOA: 11/10/2023  PCP: Pcp, No   Chief Complaint: Chest discomfort, fever  HPI: Felicia Kelly is a 88 y.o. female with medical history significant for CAD, osteoarthritis, dementia, HLD, HTN, hypothyroidism, and T2DM who presents from her memory unit at [facility] for evaluation of chest discomfort and fever.  Per sister, patient has been less interactive and slightly more confused since yesterday but has not complained of any specific symptoms. On EMS arrival, patient was found to have a fever of 101.1 and foul-smelling urine. On evaluation, patient is awake and alert.  She denies any chest pain, abdominal pain, shortness of breath, headache, nausea, vomiting, diarrhea or vision changes.  Patient is able to tell me her name, DOB, current location, identified her sister but unable to tell me the year or why she is in the hospital.  ED Course: Vitals stable overall with temp of 98.2.  Labs significant for mild leukocytosis of 10.9, Hgb 11.6, normal troponin and lactic acid, negative flu, RSV and COVID test.  UA shows small hemoglobinuria, significant proteinuria, negative nitrite, small leuks, RBC 21-50, WBC >50, and many bacteria. EKG shows sinus rhythm with short PR interval. Patient received IV Rocephin and IV NS 1 L bolus TRH was consulted for admission  Review of Systems: Please see HPI for pertinent positives and negatives. A complete 10 system review of systems are otherwise negative.  Past Medical History:  Diagnosis Date   Aortic atherosclerosis (HCC)    a. noted on CT 01/2018.   Arthritis    Colonic polyp    Coronary atherosclerosis    a. noted on CT 01/2018.   Dementia (HCC)    Genital herpes    Hyperlipidemia    Hypertension    Mild carotid artery disease (HCC)    Osteoporosis    Overdose of cardiac medication    Pulmonary nodules    a. followed by PCP by serial Ct.   Thyroid disease    seeing  Dr. Sharl Ma   Tremor    Past Surgical History:  Procedure Laterality Date   ABDOMINAL HYSTERECTOMY  1988   no cancer - reports total with bilat oophorectomy   APPENDECTOMY  1988   BREAST CYST EXCISION Bilateral    BREAST SURGERY  1985   biopsy   FOOT SURGERY     multiple sugeries, remote   TOTAL HIP ARTHROPLASTY Bilateral    Social History:  reports that she quit smoking about 45 years ago. Her smoking use included cigarettes. She has never used smokeless tobacco. She reports that she does not drink alcohol and does not use drugs.  Allergies  Allergen Reactions   Tetracycline Itching   Atenolol Other (See Comments)    Low blood pressure and slow heart rate   Atorvastatin Other (See Comments)    Leg cramps - "Allergic," per MAR   Bactrim [Sulfamethoxazole-Trimethoprim] Itching   Celecoxib Other (See Comments)    Altered, Memory disturbance, slowed thinking - "Allergic," per MAR   Niacin Other (See Comments) and Hypertension    Elevated BP- "Allergic," per MAR   Rosuvastatin Other (See Comments)    Elevated CPK - "Allergic," per MAR   Simvastatin Other (See Comments)    Leg cramps    Simvastatin Other (See Comments)    Leg cramps   Tetracyclines & Related Itching   Penicillins Swelling, Rash and Other (See Comments)    ALL -CILLINS, pt's sister states "swelling" reported is related  to the rash she gets.  Patient tolerates cephalosporins.    Family History  Problem Relation Age of Onset   Pancreatic cancer Father    Heart disease Father    Heart attack Sister 50   Arthritis Mother    Hyperlipidemia Mother    Hypertension Mother    Heart disease Mother    Diabetes Mother    Liver disease Brother        cancer of bile duct   Heart disease Maternal Grandfather    Heart attack Brother    Breast cancer Neg Hx      Prior to Admission medications   Medication Sig Start Date End Date Taking? Authorizing Provider  acetaminophen (TYLENOL) 500 MG tablet Take 500 mg by mouth  every 8 (eight) hours as needed (for pain).   Yes [provider]  amLODipine (NORVASC) 5 MG tablet TAKE ONE TABLET BY MOUTH AT BEDTIME 11/09/23  Yes Karie Georges, MD  aspirin EC 81 MG tablet Take 81 mg by mouth every evening. Swallow whole.   Yes [provider]  Cholecalciferol (VITAMIN D-3) 25 MCG (1000 UT) CAPS Take 1,000 Units by mouth every Monday, Wednesday, and Friday.   Yes [provider]  CORICIDIN COUGH/COLD 4-30 MG TABS Take 1 tablet by mouth every 6 (six) hours as needed (for cough and/or congestion).   Yes [provider]  cyanocobalamin 100 MCG tablet Take 100 mcg by mouth daily.   Yes [provider]  divalproex (DEPAKOTE) 125 MG DR tablet Take 1 tablet (125 mg total) by mouth 2 (two) times daily. 03/21/23  Yes Marcos Eke, PA-C  ezetimibe (ZETIA) 10 MG tablet Take 1 tablet (10 mg total) by mouth daily. 10/30/22  Yes Meriam Sprague, MD  furosemide (LASIX) 20 MG tablet Take 20 mg by mouth in the morning.   Yes [provider]  levothyroxine (SYNTHROID) 50 MCG tablet Take 1 tablet by mouth 6 days a week, then take 2 tablets on Sundays. 30 days Patient taking differently: Take 50-100 mcg by mouth See admin instructions. Take 100 mcg by mouth in the morning 30 minutes before breakfast on Sunday and 50 mcg on Mon/Tues/Wed/Thurs/Fri/Sat 08/10/22  Yes Karie Georges, MD  Magnesium 250 MG TABS Take 250 mg by mouth daily.   Yes [provider]  melatonin 1 MG TABS tablet Take 1 mg by mouth at bedtime.   Yes [provider]  metFORMIN (GLUCOPHAGE-XR) 500 MG 24 hr tablet Take 1 tablet (500 mg total) by mouth daily with breakfast. 08/08/22  Yes Karie Georges, MD  mirtazapine (REMERON) 30 MG tablet TAKE 1 TABLET BY MOUTH DAILY AT BEDTIME **NO REFILLS** Patient taking differently: Take 30 mg by mouth at bedtime. 12/06/22  Yes Karie Georges, MD  Multiple Vitamin (DAILY-VITE MULTIVITAMIN PO) Take 1  tablet by mouth daily with breakfast.   Yes [provider]  Multiple Vitamins-Minerals (GNP HEALTHY EYES SUPERVISION) CAPS Take 1 capsule by mouth in the morning.   Yes [provider]  Nutritional Supplements (ENSURE ORIGINAL) LIQD Take 237 mLs by mouth See admin instructions. ENSURE CHOCOLATE LIQUID DRINK: Drink 237 ml's by mouth in the morning   Yes [provider]  potassium chloride (KLOR-CON M) 10 MEQ tablet Take 1 tablet (10 mEq total) by mouth daily. 02/27/22  Yes Meriam Sprague, MD  pravastatin (PRAVACHOL) 20 MG tablet Take 20 mg by mouth at bedtime. 04/15/23  Yes [provider]  Probiotic Product (ALIGN)  4 MG CAPS Take 4 mg by mouth at bedtime.   Yes [provider]  psyllium (REGULOID) 0.52 g capsule Take 0.52 g by mouth every evening.   Yes [provider]  rivastigmine (EXELON) 6 MG capsule Take 1 capsule (6 mg total) by mouth 2 (two) times daily. 03/21/23  Yes Marcos Eke, PA-C  valACYclovir (VALTREX) 500 MG tablet Take 1 tablet (500 mg total) by mouth daily. 08/08/22  Yes Karie Georges, MD  METAMUCIL FIBER PO Take 1 capsule by mouth every evening.    [provider]  Multiple Vitamin (MULTIVITAMIN) tablet Take 1 tablet by mouth daily with breakfast.    [provider]  Probiotic Product (PROBIOTIC PO) Take 1 capsule by mouth every evening.    [provider]    Physical Exam: BP 123/65   Pulse 77   Temp 99.7 F (37.6 C) (Rectal)   Resp 18   Ht 4\' 11"  (1.499 m)   Wt 68 kg   SpO2 95%   BMI 30.30 kg/m  General: Pleasant, chronically ill-appearing elderly woman laying in bed with eyes closed. No acute distress. HEENT: [place]/AT. Anicteric sclera.  Dry mucous membrane. CV: RRR. No murmurs, rubs, or gallops. No LE edema Pulmonary: Lungs CTAB. Normal effort. No wheezing or rales. Abdominal: Soft, nontender, nondistended. Normal bowel sounds. Extremities: Palpable radial and DP pulses.  Normal ROM. Skin: Warm and dry. No obvious rash or lesions. Neuro: Awake but slightly drowsy. Opens eyes spontaneously. Oriented to self, person and place but not to time or situation. Moves all extremities. Normal sensation to light touch. No focal deficit. Psych: Normal mood and affect          Labs on Admission:  Basic Metabolic Panel: Recent Labs  Lab 11/10/23 1336  NA 138  K 3.4*  CL 102  CO2 26  GLUCOSE 146*  BUN 23  CREATININE 0.96  CALCIUM 8.8*   Liver Function Tests: Recent Labs  Lab 11/10/23 1336  AST 120*  ALT 113*  ALKPHOS 91  BILITOT 0.6  PROT 7.6  ALBUMIN 2.9*   No results for input(s): "LIPASE", "AMYLASE" in the last 168 hours. No results for input(s): "AMMONIA" in the last 168 hours. CBC: Recent Labs  Lab 11/10/23 1336  WBC 10.9*  NEUTROABS 8.6*  HGB 11.6*  HCT 37.2  MCV 90.7  PLT 336   Cardiac Enzymes: No results for input(s): "CKTOTAL", "CKMB", "CKMBINDEX", "TROPONINI" in the last 168 hours. BNP (last 3 results) No results for input(s): "BNP" in the last 8760 hours.  ProBNP (last 3 results) No results for input(s): "PROBNP" in the last 8760 hours.  CBG: No results for input(s): "GLUCAP" in the last 168 hours.  Radiological Exams on Admission: DG Chest Port 1 View Result Date: 11/10/2023 CLINICAL DATA:  Shortness of breath. EXAM: PORTABLE CHEST 1 VIEW COMPARISON:  Chest radiograph dated June 02, 2022. FINDINGS: Patient is rotated to the right. The heart size and mediastinal contours are otherwise within normal limits. Mild bibasilar atelectasis with possible trace bilateral pleural effusions. No focal consolidation. No pneumothorax. No acute osseous abnormality. IMPRESSION: Mild bibasilar atelectasis with possible trace bilateral pleural effusions. Electronically Signed   By: Hart Robinsons M.D.   On: 11/10/2023 14:03   Assessment/Plan Kaylia Winborne Stoy is a 88 y.o. female with medical history significant for CAD, osteoarthritis,  dementia, HLD, HTN, anxiety and depression, genital herpes, hypothyroidism, and T2DM who presents from her memory unit at [facility] for evaluation of chest  discomfort and fever and admitted for urinary tract infection.  # Urinary tract infection Elderly patient with history of dementia presenting from memory care unit for evaluation of fever and foul-smelling urine found to have evidence of infection on urinalysis.  She has had generalized weakness but denies any urinary symptoms. Seems to be close to her baseline mental status per sister. -Continue IV Rocephin -Follow-up urine culture, blood culture -Trend CBC, fever curve  # HTN BP stable with SBP in the 110s to 140s. -Continue amlodipine  # Mild hypokalemia K+ slightly down to 3.4. -KCl 40 mEq x 1 today -Check mag and Phos -Continue home potassium supplementation tomorrow  # T2DM Last A1c 6.4% 1 year ago.  CBG of 71. -SSI with every 4 hours CBG checks -Resume home metformin at discharge  # HLD # CAD -Continue pravastatin, Zetia and aspirin  # Hypothyroidism -Continue on Synthroid -Follow-up repeat TSH  # Dementia -Continue rivastigmine -Delirium precautions  # Anxiety and depression mirtazapine and Depakote  # Vitamin deficiencies -Continue multivitamin, vitamin B12 and vitamin D supplements -Follow-up vitamin D and B12 levels  # Hx lower extremity edema No evidence of heart failure. No lower extremities edema on exam. -Continue on Lasix  # Hx herpes -Continue valacyclovir  # Insomnia -Continue melatonin  # Generalized debility -PT/OT eval -Continue protein supplementation  DVT prophylaxis: Lovenox     Code Status: Limited: Do not attempt resuscitation (DNR) -DNR-LIMITED -Do Not Intubate/DNI   Consults called: None  Family Communication: Discussed admission with sister at bedside  Severity of Illness: The appropriate patient status for this patient is OBSERVATION. Observation status is judged  to be reasonable and necessary in order to provide the required intensity of service to ensure the patient's safety. The patient's presenting symptoms, physical exam findings, and initial radiographic and laboratory data in the context of their medical condition is felt to place them at decreased risk for further clinical deterioration. Furthermore, it is anticipated that the patient will be medically stable for discharge from the hospital within 2 midnights of admission.   Level of care: Med-Surg   Steffanie Rainwater, MD 11/10/2023, 4:56 PM [facility] Pager: [phone] Isaiah 41:10   If 7PM-7AM, please contact night-coverage [email or web address] Password [email or web address]

## 2023-11-10 NOTE — ED Provider Notes (Signed)
[place] EMERGENCY DEPARTMENT AT [facility] Provider Note   CSN: [id number] Arrival date & time: 11/10/23  1117     History  Chief Complaint  Patient presents with   Urinary Frequency    Felicia Kelly is a 88 y.o. female.  HPI 88 year old female history of dementia, CAD, hypertension, hyperlipidemia presenting for chest pain.  Per triage note she had chest pain shortness of breath at her memory care facility today.  She had productive cough as well and reportedly had a temperature of 101.1 and EMS had concern for UTI due to malodorous urine.  Patient's sister is here at bedside.  Sister reports patient is at her baseline mental status with mild confusion from dementia.  The patient denies any chest pain, shortness of breath, headache, weakness, numbness, vision changes.  No abdominal pain or vomiting or diarrhea.  She is not remember having chest pain earlier today.  Denies any urinary symptoms.  She is otherwise asymptomatic.     Home Medications Prior to Admission medications   Medication Sig Start Date End Date Taking? Authorizing Provider  amLODipine (NORVASC) 5 MG tablet TAKE ONE TABLET BY MOUTH AT BEDTIME 11/09/23   Karie Georges, MD  aspirin EC 81 MG tablet Take 81 mg by mouth every evening. Swallow whole.    [provider]  Cholecalciferol (VITAMIN D-3) 25 MCG (1000 UT) CAPS Take 1,000 Units by mouth every Monday, Wednesday, and Friday.    [provider]  cyanocobalamin 100 MCG tablet Take 100 mcg by mouth daily.    [provider]  Dextromethorphan-guaiFENesin (CORICIDIN HBP CONGESTION/COUGH PO) Take by mouth. As needed    [provider]  divalproex (DEPAKOTE) 125 MG DR tablet Take 1 tablet (125 mg total) by mouth 2 (two) times daily. 03/21/23   Marcos Eke, PA-C  ezetimibe (ZETIA) 10 MG tablet Take 1 tablet (10 mg total) by mouth daily. 10/30/22   Meriam Sprague, MD  levothyroxine (SYNTHROID) 50 MCG tablet  Take 1 tablet by mouth 6 days a week, then take 2 tablets on Sundays. 30 days 08/10/22   Karie Georges, MD  Magnesium 250 MG TABS Take 250 mg by mouth daily.    [provider]  melatonin 1 MG TABS tablet Take 1 mg by mouth at bedtime.    [provider]  METAMUCIL FIBER PO Take 1 capsule by mouth every evening.    [provider]  metFORMIN (GLUCOPHAGE-XR) 500 MG 24 hr tablet Take 1 tablet (500 mg total) by mouth daily with breakfast. 08/08/22   Karie Georges, MD  mirtazapine (REMERON) 30 MG tablet TAKE 1 TABLET BY MOUTH DAILY AT BEDTIME **NO REFILLS** 12/06/22   Karie Georges, MD  Multiple Vitamin (MULTIVITAMIN) tablet Take 1 tablet by mouth daily with breakfast.    [provider]  Multiple Vitamins-Minerals (OCUVITE EYE HEALTH FORMULA) CAPS Take 1 capsule by mouth in the morning.    [provider]  Nutritional Supplements (ENSURE ORIGINAL) LIQD Take by mouth. 1 per day    [provider]  potassium chloride (KLOR-CON M) 10 MEQ tablet Take 1 tablet (10 mEq total) by mouth daily. 02/27/22   Meriam Sprague, MD  pravastatin (PRAVACHOL) 20 MG tablet Take 20 mg by mouth daily. 04/15/23   [provider]  Probiotic Product (PROBIOTIC PO) Take 1 capsule by mouth every evening.    [provider]  rivastigmine (EXELON) 6 MG capsule Take 1 capsule (6 mg  total) by mouth 2 (two) times daily. 03/21/23   Marcos Eke, PA-C  valACYclovir (VALTREX) 500 MG tablet Take 1 tablet (500 mg total) by mouth daily. 08/08/22   Karie Georges, MD      Allergies    Tetracycline, Atenolol, Atorvastatin, Atorvastatin, Bactrim [sulfamethoxazole-trimethoprim], Celecoxib, Celecoxib, Niacin, Niacin, Rosuvastatin, Simvastatin, Simvastatin, Sulfa antibiotics, Sulfamethoxazole-trimethoprim, Tetracyclines & related, Tetracyclines & related, Penicillins, and Penicillins    Review of Systems   Review of Systems Review of systems  completed and notable as per HPI.  ROS otherwise negative.   Physical Exam Updated Vital Signs BP 123/65   Pulse 77   Temp 99.7 F (37.6 C) (Rectal)   Resp 18   Ht 4\' 11"  (1.499 m)   Wt 68 kg   SpO2 95%   BMI 30.30 kg/m  Physical Exam Vitals and nursing note reviewed.  Constitutional:      General: She is not in acute distress.    Appearance: She is well-developed.  HENT:     Head: Normocephalic and atraumatic.     Nose: Nose normal.     Mouth/Throat:     Mouth: Mucous membranes are moist.     Pharynx: Oropharynx is clear.  Eyes:     Extraocular Movements: Extraocular movements intact.     Conjunctiva/sclera: Conjunctivae normal.     Pupils: Pupils are equal, round, and reactive to light.  Cardiovascular:     Rate and Rhythm: Normal rate and regular rhythm.     Pulses: Normal pulses.     Heart sounds: Normal heart sounds. No murmur heard. Pulmonary:     Effort: Pulmonary effort is normal. No respiratory distress.     Breath sounds: Rales present.     Comments: Slight rales in the right lower lobe Abdominal:     Palpations: Abdomen is soft.     Tenderness: There is no abdominal tenderness. There is no guarding or rebound.  Musculoskeletal:        General: No swelling.     Cervical back: Neck supple.     Right lower leg: No edema.     Left lower leg: No edema.  Skin:    General: Skin is warm and dry.     Capillary Refill: Capillary refill takes less than 2 seconds.  Neurological:     General: No focal deficit present.     Mental Status: She is alert. Mental status is at baseline.     Cranial Nerves: No cranial nerve deficit.     Sensory: No sensory deficit.     Motor: No weakness.  Psychiatric:        Mood and Affect: Mood normal.     ED Results / Procedures / Treatments   Labs (all labs ordered are listed, but only abnormal results are displayed) Labs Reviewed  COMPREHENSIVE METABOLIC PANEL - Abnormal; Notable for the following components:      Result  Value   Potassium 3.4 (*)    Glucose, Bld 146 (*)    Calcium 8.8 (*)    Albumin 2.9 (*)    AST 120 (*)    ALT 113 (*)    GFR, Estimated 58 (*)    All other components within normal limits  CBC WITH DIFFERENTIAL/PLATELET - Abnormal; Notable for the following components:   WBC 10.9 (*)    Hemoglobin 11.6 (*)    Neutro Abs 8.6 (*)    All other components within normal limits  URINALYSIS, W/ REFLEX TO CULTURE (INFECTION SUSPECTED) -  Abnormal; Notable for the following components:   APPearance TURBID (*)    Hgb urine dipstick SMALL (*)    Protein, ur >=300 (*)    Leukocytes,Ua SMALL (*)    Bacteria, UA MANY (*)    All other components within normal limits  RESP PANEL BY RT-PCR (RSV, FLU A&B, COVID)  RVPGX2  CULTURE, BLOOD (ROUTINE X 2)  CULTURE, BLOOD (ROUTINE X 2)  URINE CULTURE  I-STAT CG4 LACTIC ACID, ED  TROPONIN I (HIGH SENSITIVITY)  TROPONIN I (HIGH SENSITIVITY)    EKG EKG Interpretation Date/Time:  Saturday November 10 2023 11:35:12 EST Ventricular Rate:  92 PR Interval:  78 QRS Duration:  85 QT Interval:  350 QTC Calculation: 433 R Axis:   49  Text Interpretation: Sinus rhythm Short PR interval Low voltage, precordial leads Nonspecific repol abnormality, lateral leads Confirmed by Fulton Reek [phone]) on 11/10/2023 1:14:38 PM  Radiology DG Chest Port 1 View Result Date: 11/10/2023 CLINICAL DATA:  Shortness of breath. EXAM: PORTABLE CHEST 1 VIEW COMPARISON:  Chest radiograph dated June 02, 2022. FINDINGS: Patient is rotated to the right. The heart size and mediastinal contours are otherwise within normal limits. Mild bibasilar atelectasis with possible trace bilateral pleural effusions. No focal consolidation. No pneumothorax. No acute osseous abnormality. IMPRESSION: Mild bibasilar atelectasis with possible trace bilateral pleural effusions. Electronically Signed   By: Hart Robinsons M.D.   On: 11/10/2023 14:03    Procedures Procedures    Medications Ordered  in ED Medications  sodium chloride 0.9 % bolus 1,000 mL (1,000 mLs Intravenous New Bag/Given 11/10/23 1441)  cefTRIAXone (ROCEPHIN) 1 g in sodium chloride 0.9 % 100 mL IVPB (1 g Intravenous New Bag/Given 11/10/23 1459)    ED Course/ Medical Decision Making/ A&P Clinical Course as of 11/10/23 1550  Sat Nov 10, 2023  1447 Urine grossly purulent per nursing report.  Rocephin ordered.  No prior resistant cultures seen. [JD]    Clinical Course User Index [JD] Laurence Spates, MD                                 Medical Decision Making Amount and/or Complexity of Data Reviewed Labs: ordered. Radiology: ordered.  Risk Decision regarding hospitalization.   Medical Decision Making:   Shavaughn Seidl is a 88 y.o. female who presented to the ED today with chest pain, shortness of breath.  Here she is afebrile and hemodynamically stable.  On exam she is overall well-appearing, has some mild rales in the right lower lobe.  Possible pneumonia.  Reports he had chest pain shortness of breath earlier, EKG is not acutely ischemic will trend troponin.  I have low concern for PE at this time.  Will evaluate for UTI as well.  I will check a rectal temperature to see if she is febrile, reportedly had a fever with EMS.  Will evaluate for COVID and flu as well.  Abdominal exam is benign.   Patient placed on continuous vitals and telemetry monitoring while in ED which was reviewed periodically.  Reviewed and confirmed nursing documentation for past medical history, family history, social history.  Reassessment and Plan:   Labwork reviewed notable for leukocytosis, transaminitis.  She is no abdominal tenderness.  She is significant UTI and grossly purulent urine.  Borderline febrile here.  I ordered Rocephin after reviewing her prior cultures, I did not see any resistant bugs recently.  Given her age, leukocytosis, fever and  significant UTI think she would benefit from admission for IV antibiotics per  discussed with hospitalist and admitted.  Patient and sister updated on plan at bedside.   Patient's presentation is most consistent with acute complicated illness / injury requiring diagnostic workup.           Final Clinical Impression(s) / ED Diagnoses Final diagnoses:  Cystitis    Rx / DC Orders ED Discharge Orders     None         Laurence Spates, MD 11/10/23 1550

## 2023-11-10 NOTE — Plan of Care (Signed)
  Problem: Health Behavior/Discharge Planning: Goal: Ability to manage health-related needs will improve Outcome: Progressing   Problem: Clinical Measurements: Goal: Ability to maintain clinical measurements within normal limits will improve Outcome: Progressing   Problem: Activity: Goal: Risk for activity intolerance will decrease Outcome: Progressing   Problem: Nutrition: Goal: Adequate nutrition will be maintained Outcome: Progressing   Problem: Coping: Goal: Level of anxiety will decrease Outcome: Progressing   Problem: Elimination: Goal: Will not experience complications related to bowel motility Outcome: Progressing   Problem: Pain Managment: Goal: General experience of comfort will improve and/or be controlled Outcome: Progressing   Problem: Safety: Goal: Ability to remain free from injury will improve Outcome: Progressing   Problem: Skin Integrity: Goal: Risk for impaired skin integrity will decrease Outcome: Progressing

## 2023-11-11 DIAGNOSIS — B961 Klebsiella pneumoniae [K. pneumoniae] as the cause of diseases classified elsewhere: Secondary | ICD-10-CM | POA: Diagnosis present

## 2023-11-11 DIAGNOSIS — Z79899 Other long term (current) drug therapy: Secondary | ICD-10-CM | POA: Diagnosis not present

## 2023-11-11 DIAGNOSIS — N3000 Acute cystitis without hematuria: Secondary | ICD-10-CM | POA: Diagnosis not present

## 2023-11-11 DIAGNOSIS — I10 Essential (primary) hypertension: Secondary | ICD-10-CM | POA: Diagnosis present

## 2023-11-11 DIAGNOSIS — F0393 Unspecified dementia, unspecified severity, with mood disturbance: Secondary | ICD-10-CM | POA: Diagnosis present

## 2023-11-11 DIAGNOSIS — I700 Atherosclerosis of aorta: Secondary | ICD-10-CM | POA: Diagnosis present

## 2023-11-11 DIAGNOSIS — I2510 Atherosclerotic heart disease of native coronary artery without angina pectoris: Secondary | ICD-10-CM | POA: Diagnosis present

## 2023-11-11 DIAGNOSIS — R531 Weakness: Secondary | ICD-10-CM

## 2023-11-11 DIAGNOSIS — B964 Proteus (mirabilis) (morganii) as the cause of diseases classified elsewhere: Secondary | ICD-10-CM | POA: Diagnosis present

## 2023-11-11 DIAGNOSIS — N3001 Acute cystitis with hematuria: Secondary | ICD-10-CM

## 2023-11-11 DIAGNOSIS — G4700 Insomnia, unspecified: Secondary | ICD-10-CM | POA: Diagnosis present

## 2023-11-11 DIAGNOSIS — N390 Urinary tract infection, site not specified: Secondary | ICD-10-CM | POA: Diagnosis present

## 2023-11-11 DIAGNOSIS — E876 Hypokalemia: Secondary | ICD-10-CM | POA: Diagnosis present

## 2023-11-11 DIAGNOSIS — F0394 Unspecified dementia, unspecified severity, with anxiety: Secondary | ICD-10-CM | POA: Diagnosis present

## 2023-11-11 DIAGNOSIS — Z1152 Encounter for screening for COVID-19: Secondary | ICD-10-CM | POA: Diagnosis not present

## 2023-11-11 DIAGNOSIS — Z66 Do not resuscitate: Secondary | ICD-10-CM | POA: Diagnosis present

## 2023-11-11 DIAGNOSIS — Z7984 Long term (current) use of oral hypoglycemic drugs: Secondary | ICD-10-CM | POA: Diagnosis not present

## 2023-11-11 DIAGNOSIS — E119 Type 2 diabetes mellitus without complications: Secondary | ICD-10-CM | POA: Diagnosis present

## 2023-11-11 DIAGNOSIS — E785 Hyperlipidemia, unspecified: Secondary | ICD-10-CM | POA: Diagnosis present

## 2023-11-11 DIAGNOSIS — Z87891 Personal history of nicotine dependence: Secondary | ICD-10-CM | POA: Diagnosis not present

## 2023-11-11 DIAGNOSIS — Z79890 Hormone replacement therapy (postmenopausal): Secondary | ICD-10-CM | POA: Diagnosis not present

## 2023-11-11 DIAGNOSIS — E039 Hypothyroidism, unspecified: Secondary | ICD-10-CM | POA: Diagnosis present

## 2023-11-11 DIAGNOSIS — N3090 Cystitis, unspecified without hematuria: Secondary | ICD-10-CM | POA: Diagnosis present

## 2023-11-11 DIAGNOSIS — Z8249 Family history of ischemic heart disease and other diseases of the circulatory system: Secondary | ICD-10-CM | POA: Diagnosis not present

## 2023-11-11 DIAGNOSIS — Z96643 Presence of artificial hip joint, bilateral: Secondary | ICD-10-CM | POA: Diagnosis present

## 2023-11-11 DIAGNOSIS — F32A Depression, unspecified: Secondary | ICD-10-CM | POA: Diagnosis present

## 2023-11-11 DIAGNOSIS — F03918 Unspecified dementia, unspecified severity, with other behavioral disturbance: Secondary | ICD-10-CM | POA: Diagnosis present

## 2023-11-11 DIAGNOSIS — Z7982 Long term (current) use of aspirin: Secondary | ICD-10-CM | POA: Diagnosis not present

## 2023-11-11 LAB — BASIC METABOLIC PANEL
Anion gap: 10 (ref 5–15)
BUN: 21 mg/dL (ref 8–23)
CO2: 24 mmol/L (ref 22–32)
Calcium: 8.7 mg/dL — ABNORMAL LOW (ref 8.9–10.3)
Chloride: 107 mmol/L (ref 98–111)
Creatinine, Ser: 0.9 mg/dL (ref 0.44–1.00)
GFR, Estimated: >60 mL/min (ref >60)
Glucose, Bld: 108 mg/dL — ABNORMAL HIGH (ref 70–99)
Potassium: 3.9 mmol/L (ref 3.5–5.1)
Sodium: 141 mmol/L (ref 135–145)

## 2023-11-11 LAB — GLUCOSE, CAPILLARY
Glucose-Capillary: 105 mg/dL — ABNORMAL HIGH (ref 70–99)
Glucose-Capillary: 106 mg/dL — ABNORMAL HIGH (ref 70–99)
Glucose-Capillary: 137 mg/dL — ABNORMAL HIGH (ref 70–99)
Glucose-Capillary: 80 mg/dL (ref 70–99)
Glucose-Capillary: 91 mg/dL (ref 70–99)
Glucose-Capillary: 96 mg/dL (ref 70–99)

## 2023-11-11 LAB — CBC
HCT: 33.9 % — ABNORMAL LOW (ref 36.0–46.0)
Hemoglobin: 10.4 g/dL — ABNORMAL LOW (ref 12.0–15.0)
MCH: 28.7 pg (ref 26.0–34.0)
MCHC: 30.7 g/dL (ref 30.0–36.0)
MCV: 93.4 fL (ref 80.0–100.0)
Platelets: 327 10*3/uL (ref 150–400)
RBC: 3.63 MIL/uL — ABNORMAL LOW (ref 3.87–5.11)
RDW: 13.6 % (ref 11.5–15.5)
WBC: 9.7 10*3/uL (ref 4.0–10.5)
nRBC: 0 % (ref 0.0–0.2)

## 2023-11-11 LAB — VITAMIN D 25 HYDROXY (VIT D DEFICIENCY, FRACTURES): Vit D, 25-Hydroxy: 57.03 ng/mL (ref 30–100)

## 2023-11-11 MED ORDER — CEFTRIAXONE SODIUM 1 G IJ SOLR
1.0000 g | INTRAMUSCULAR | Status: DC
  Administered 2023-11-11 – 2023-11-13 (×3): 1 g via INTRAVENOUS
  Filled 2023-11-11 (×3): qty 10

## 2023-11-11 NOTE — Evaluation (Signed)
Physical Therapy Evaluation Patient Details Name: Felicia Kelly MRN: [id number] DOB: Apr 02, 1936 Today's Date: 11/11/2023  History of Present Illness  Pt admitted from [facility] memory care 2* SOB and UTI.  Pt with hx of dementia, tremor CAD and bil THR  Clinical Impression  Pt admitted as above and presenting with functional mobility limitations 2* generalized weakness, decreased activity tolerance, ambulatory balance deficits and dementia related cognitive deficits.  Pt very cooperative and should progress to dc back to prior living arrangement at [facility] memory care.  Pt could benefit from follow up PT to maximize IND and safety.         If plan is discharge home, recommend the following: A little help with walking and/or transfers;A little help with bathing/dressing/bathroom;Assistance with cooking/housework;Assist for transportation   Can travel by private vehicle        Equipment Recommendations None recommended by PT  Recommendations for Other Services       Functional Status Assessment Patient has had a recent decline in their functional status and demonstrates the ability to make significant improvements in function in a reasonable and predictable amount of time.     Precautions / Restrictions Precautions Precautions: Fall Restrictions Weight Bearing Restrictions Per Provider Order: No      Mobility  Bed Mobility Overal bed mobility: Needs Assistance Bed Mobility: Supine to Sit     Supine to sit: Mod assist     General bed mobility comments: Increased time with cues for sequence and assist to bring trunk to upright and to complete rotation to EOB sitting    Transfers Overall transfer level: Needs assistance Equipment used: Rolling walker (2 wheels) Transfers: Sit to/from Stand Sit to Stand: Min assist, Mod assist, From elevated surface           General transfer comment: cues for LE management and use of UEs to self assist;  Physical  assist to bring wt up and fwd and to balance in standing with RW.    Ambulation/Gait Ambulation/Gait assistance: Min assist Gait Distance (Feet): 42 Feet Assistive device: Rolling walker (2 wheels) Gait Pattern/deviations: Step-to pattern, Step-through pattern, Decreased step length - right, Decreased step length - left, Shuffle, Trunk flexed       General Gait Details: cues for posture, position from RW and safety awareness;  Physical assist for stability and RW management.  Distance ltd by fatigue  Stairs            Wheelchair Mobility     Tilt Bed    Modified Rankin (Stroke Patients Only)       Balance Overall balance assessment: Needs assistance Sitting-balance support: No upper extremity supported, Feet supported Sitting balance-Leahy Scale: Fair     Standing balance support: Bilateral upper extremity supported Standing balance-Leahy Scale: Poor Standing balance comment: posterior drift                             Pertinent Vitals/Pain Pain Assessment Pain Assessment: No/denies pain    Home Living Family/patient expects to be discharged to:: Other (Comment)                   Additional Comments: Back to memory care    Prior Function Prior Level of Function : Independent/Modified Independent             Mobility Comments: Using RW per chart but using nothing per pt       Extremity/Trunk Assessment  Upper Extremity Assessment Upper Extremity Assessment: Generalized weakness    Lower Extremity Assessment Lower Extremity Assessment: Generalized weakness       Communication   Communication Communication: No apparent difficulties Cueing Techniques: Verbal cues;Tactile cues;Gestural cues  Cognition Arousal: Alert Behavior During Therapy: WFL for tasks assessed/performed, Flat affect Overall Cognitive Status: History of cognitive impairments - at baseline                                 General Comments:  delayed processing to follow single step commands        General Comments      Exercises     Assessment/Plan    PT Assessment Patient needs continued PT services  PT Problem List Decreased strength;Decreased activity tolerance;Decreased balance;Decreased mobility;Decreased cognition;Decreased knowledge of use of DME;Decreased safety awareness       PT Treatment Interventions DME instruction;Gait training;Functional mobility training;Therapeutic activities;Therapeutic exercise;Balance training;Patient/family education    PT Goals (Current goals can be found in the Care Plan section)  Acute Rehab PT Goals Patient Stated Goal: Get up for lunch PT Goal Formulation: With patient Time For Goal Achievement: 11/25/23 Potential to Achieve Goals: Good    Frequency Min 1X/week     Co-evaluation               AM-PAC PT "6 Clicks" Mobility  Outcome Measure Help needed turning from your back to your side while in a flat bed without using bedrails?: A Little Help needed moving from lying on your back to sitting on the side of a flat bed without using bedrails?: A Lot Help needed moving to and from a bed to a chair (including a wheelchair)?: A Lot Help needed standing up from a chair using your arms (e.g., wheelchair or bedside chair)?: A Lot Help needed to walk in hospital room?: A Little Help needed climbing 3-5 steps with a railing? : A Lot 6 Click Score: 14    End of Session Equipment Utilized During Treatment: Gait belt Activity Tolerance: Patient tolerated treatment well;Patient limited by fatigue Patient left: in chair;with call bell/phone within reach;with chair alarm set;with family/visitor present Nurse Communication: Mobility status PT Visit Diagnosis: Unsteadiness on feet (R26.81);Muscle weakness (generalized) (M62.81);Difficulty in walking, not elsewhere classified (R26.2)    Time: 3664-4034 PT Time Calculation (min) (ACUTE ONLY): 25 min   Charges:   PT  Evaluation $PT Eval Low Complexity: 1 Low PT Treatments $Gait Training: 8-22 mins PT General Charges $$ ACUTE PT VISIT: 1 Visit         Mauro Kaufmann PT Acute Rehabilitation Services Pager [phone] Office [phone]   Trenyce Loera 11/11/2023, 4:26 PM

## 2023-11-11 NOTE — Progress Notes (Signed)
[facility]  PROGRESS NOTE  Felicia Kelly ZOX:[id number] DOB: 11-05-1935 DOA: 11/10/2023 PCP: Pcp, No   Brief HPI:    88 y.o. female with medical history significant for CAD, osteoarthritis, dementia, HLD, HTN, hypothyroidism, and T2DM who presents from her memory unit at [facility] for evaluation of chest discomfort and fever.     Assessment/Plan:    UTI -Presented from memory care unit for evaluation of fever and foul-smelling urine -Found to have abnormal UA -Started on IV Rocephin -Follow urine culture results  Hypertension -Blood pressure is stable -Continue amlodipine  Hypokalemia -Replete  Diabetes mellitus type 2 -Hemoglobin A1c 6.4 -Continue sliding scale insulin with NovoLog CBG well-controlled -Resume metformin at discharge   CAD/hyperlipidemia -Continue pravastatin, Zetia, aspirin  Hypothyroidism -Continue Synthroid -TSH 5.132   Dementia -Continue rivastigmine -Continue delirium precautions  Anxiety/depression -Continue mirtazapine, Depakote   Vitamin deficiencies -B12 level 1442 -Vitamin D level 57.03 -Will discontinue B12 supplementation    Hx lower extremity edema No evidence of heart failure. No lower extremities edema on exam. -Continue on Lasix  Transaminitis -Mild elevation of AST and ALT -Unclear etiology -Follow LFTs in a.m.   Hx herpes -Continue valacyclovir    Insomnia -Continue melatonin    Generalized debility -PT/OT eval -Continue protein supplementation   Medications     acidophilus  1 capsule Oral QHS   amLODipine  5 mg Oral QHS   aspirin EC  81 mg Oral QHS   [START ON 11/12/2023] cholecalciferol  1,000 Units Oral Q M,W,F   divalproex  125 mg Oral BID   enoxaparin (LOVENOX) injection  40 mg Subcutaneous Q24H   ezetimibe  10 mg Oral Daily   furosemide  20 mg Oral Q breakfast   insulin aspart  0-15 Units Subcutaneous Q4H   levothyroxine  100 mcg Oral Q Sun   [START ON 11/12/2023] levothyroxine   50 mcg Oral Once per day on Monday Tuesday Wednesday Thursday Friday Saturday   magnesium oxide  200 mg Oral Daily   melatonin  1.5 mg Oral QHS   mirtazapine  30 mg Oral QHS   multivitamin with minerals  1 tablet Oral Q breakfast   potassium chloride  10 mEq Oral Daily   pravastatin  20 mg Oral QHS   Ensure Max Protein  237 mL Oral Daily   psyllium  1 packet Oral QHS   rivastigmine  6 mg Oral BID   valACYclovir  500 mg Oral Daily   vitamin B-12  100 mcg Oral Daily     Data Reviewed:   CBG:  Recent Labs  Lab 11/10/23 1934 11/10/23 2353 11/11/23 0341 11/11/23 0751 11/11/23 1102  GLUCAP 109* 108* 106* 80 91    SpO2: 93 %    Vitals:   11/10/23 2159 11/11/23 0131 11/11/23 0418 11/11/23 0953  BP: 134/63 131/60 128/64 (!) 117/56  Pulse: 90 85 78 76  Resp: 16 16 18 17   Temp: (!) 100.6 F (38.1 C) 97.8 F (36.6 C) 98.8 F (37.1 C) 98.9 F (37.2 C)  TempSrc: Oral Oral Oral   SpO2: 94% 94% 95% 93%  Weight:      Height:          Data Reviewed:  Basic Metabolic Panel: Recent Labs  Lab 11/10/23 1336 11/10/23 1935 11/11/23 0317  NA 138  --  141  K 3.4*  --  3.9  CL 102  --  107  CO2 26  --  24  GLUCOSE 146*  --  108*  BUN 23  --  21  CREATININE 0.96  --  0.90  CALCIUM 8.8*  --  8.7*  MG  --  2.0  --   PHOS  --  3.3  --     CBC: Recent Labs  Lab 11/10/23 1336 11/11/23 0317  WBC 10.9* 9.7  NEUTROABS 8.6*  --   HGB 11.6* 10.4*  HCT 37.2 33.9*  MCV 90.7 93.4  PLT 336 327    LFT Recent Labs  Lab 11/10/23 1336  AST 120*  ALT 113*  ALKPHOS 91  BILITOT 0.6  PROT 7.6  ALBUMIN 2.9*     Antibiotics: Anti-infectives (From admission, onward)    Start     Dose/Rate Route Frequency Ordered Stop   11/10/23 1915  valACYclovir (VALTREX) tablet 500 mg        500 mg Oral Daily 11/10/23 1825     11/10/23 1500  cefTRIAXone (ROCEPHIN) 1 g in sodium chloride 0.9 % 100 mL IVPB        1 g 200 mL/hr over 30 Minutes Intravenous  Once 11/10/23 1447 11/10/23  1529        DVT prophylaxis: Lovenox  Code Status: DNR  Family Communication: No family at bedside   CONSULTS    Subjective   Still complains of dysuria.   Objective    Physical Examination:   General-appears in no acute distress Heart-S1-S2, regular, no murmur auscultated Lungs-clear to auscultation bilaterally, no wheezing or crackles auscultated Abdomen-soft, nontender, no organomegaly Extremities-no edema in the lower extremities Neuro-alert, oriented x3, no focal deficit noted   Status is: Inpatient:         Felicia Kelly   [facility] If 7PM-7AM, please contact night-coverage at [email or web address], Office  [phone]   11/11/2023, 1:40 PM  LOS: 0 days

## 2023-11-11 NOTE — Plan of Care (Signed)
?  Problem: Clinical Measurements: ?Goal: Diagnostic test results will improve ?Outcome: Progressing ?  ?Problem: Safety: ?Goal: Ability to remain free from injury will improve ?Outcome: Progressing ?  ?

## 2023-11-12 DIAGNOSIS — N3000 Acute cystitis without hematuria: Secondary | ICD-10-CM

## 2023-11-12 LAB — GLUCOSE, CAPILLARY
Glucose-Capillary: 103 mg/dL — ABNORMAL HIGH (ref 70–99)
Glucose-Capillary: 101 mg/dL — ABNORMAL HIGH (ref 70–99)
Glucose-Capillary: 118 mg/dL — ABNORMAL HIGH (ref 70–99)
Glucose-Capillary: 95 mg/dL (ref 70–99)
Glucose-Capillary: 98 mg/dL (ref 70–99)

## 2023-11-12 LAB — HEPATIC FUNCTION PANEL
ALT: 182 U/L — ABNORMAL HIGH (ref 0–44)
AST: 156 U/L — ABNORMAL HIGH (ref 15–41)
Albumin: 2.5 g/dL — ABNORMAL LOW (ref 3.5–5.0)
Alkaline Phosphatase: 91 U/L (ref 38–126)
Bilirubin, Direct: <0.1 mg/dL (ref 0.0–0.2)
Total Bilirubin: 0.3 mg/dL (ref 0.0–1.2)
Total Protein: 6.4 g/dL — ABNORMAL LOW (ref 6.5–8.1)

## 2023-11-12 MED ORDER — IPRATROPIUM-ALBUTEROL 0.5-2.5 (3) MG/3ML IN SOLN: 3.0000 mL | RESPIRATORY_TRACT | Status: DC | PRN

## 2023-11-12 MED ORDER — METOPROLOL TARTRATE 5 MG/5ML IV SOLN
5.0000 mg | INTRAVENOUS | Status: DC | PRN
Start: 2023-11-12 — End: 2023-11-13

## 2023-11-12 MED ORDER — TRAZODONE HCL 50 MG PO TABS: 50.0000 mg | ORAL_TABLET | Freq: Every evening | ORAL | Status: DC | PRN

## 2023-11-12 MED ORDER — HYDRALAZINE HCL 20 MG/ML IJ SOLN: 10.0000 mg | INTRAMUSCULAR | Status: DC | PRN

## 2023-11-12 MED ORDER — PRAVASTATIN SODIUM 20 MG PO TABS: 20.0000 mg | ORAL_TABLET | Freq: Every day | ORAL | Status: DC

## 2023-11-12 MED ORDER — GUAIFENESIN 100 MG/5ML PO LIQD: 5.0000 mL | ORAL | Status: DC | PRN

## 2023-11-12 NOTE — TOC Initial Note (Signed)
Transition of Care [facility]) - Initial/Assessment Note    Patient Details  Name: Felicia Kelly MRN: [id number] Date of Birth: 1936-01-13  Transition of Care [facility]) CM/SW Contact:    Howell Rucks, RN Phone Number: 11/12/2023, 9:41 AM  Clinical Narrative:  Call to pt's sister , Gwynneth Macleod) to introduce role of TOC/NCM and review for dc planning, [name] confirmed pt resides at [facility] and plan is for her to return. Reviewed PT recommendation for [facility] PT, agreeable, reports pt receiving received HH PT/OT through facility inhouse provider, [facility], request for [facility] PT order sent to attending. TOC will continue to follow.                  Expected Discharge Plan: Skilled Nursing Facility Barriers to Discharge: Continued Medical Work up   Patient Goals and CMS Choice Patient states their goals for this hospitalization and ongoing recovery are:: per pt's sister [name]-POA), return to [facility]          Expected Discharge Plan and Services       Living arrangements for the past 2 months: Skilled Nursing Facility [facility])                           [facility] Arranged: PT          Prior Living Arrangements/Services Living arrangements for the past 2 months: Skilled Nursing Facility ([facility]) Lives with:: Self Patient language and need for interpreter reviewed:: Yes Do you feel safe going back to the place where you live?: Yes      Need for Family Participation in Patient Care: Yes (Comment) Care giver support system in place?: Yes (comment) Current home services: DME ([facility]) Criminal Activity/Legal Involvement Pertinent to Current Situation/Hospitalization: No - Comment as needed  Activities of Daily Living   ADL Screening (condition at time of admission) Independently performs ADLs?: No Does the patient have a NEW difficulty with bathing/dressing/toileting/self-feeding that is  expected to last >3 days?: No Does the patient have a NEW difficulty with getting in/out of bed, walking, or climbing stairs that is expected to last >3 days?: No Does the patient have a NEW difficulty with communication that is expected to last >3 days?: No Is the patient deaf or have difficulty hearing?: No Does the patient have difficulty seeing, even when wearing glasses/contacts?: No Does the patient have difficulty concentrating, remembering, or making decisions?: No  Permission Sought/Granted                  Emotional Assessment Appearance:: Appears stated age     Orientation: : Oriented to Self Alcohol / Substance Use: Not Applicable Psych Involvement: No (comment)  Admission diagnosis:  UTI (urinary tract infection) [N39.0] Cystitis [N30.90] Patient Active Problem List   Diagnosis Date Noted   UTI (urinary tract infection) 11/10/2023   Generalized weakness 11/10/2023   Impacted cerumen of both ears 07/17/2023   Depression, major, single episode, mild (HCC) 07/26/2022   Weight loss, unintentional 06/08/2022   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   2019 novel coronavirus disease (COVID-19) 05/28/2022   Hypocalcemia 05/28/2022   Hypoglycemia 05/28/2022   SAH (subarachnoid hemorrhage) (HCC) 02/24/2020   Bradycardia 04/01/2019   Cystocele 02/13/2019   Hip pain 02/13/2019   Osteoarthritis of hip 02/13/2019   Subchondral cysts 02/13/2019   Vitamin D deficiency 02/13/2019   Aortic atherosclerosis (HCC) 10/14/2017   Solitary pulmonary nodule  10/23/2016   Hyperglycemia 07/11/2016   Hypercholesterolemia 06/04/2013   Hypothyroidism 06/04/2013   Osteopenia 06/27/2012   Malignant basal cell neoplasm of skin 02/14/2012   Benign essential hypertension 11/22/2011   Internal carotid artery stenosis 09/18/2011   Multinodular goiter 10/16/2009   Herpes simplex type 2 infection 10/05/2008   Single renal cyst 02/14/2008   PCP:  Pcp, No Pharmacy:   [facility]  [place], [place] - [address] [place] [address] Phone: [phone] Fax: [phone]     Social Drivers of Health (SDOH) Social History: SDOH Screenings   Food Insecurity: No Food Insecurity (11/10/2023)  Housing: Low Risk  (11/10/2023)  Transportation Needs: No Transportation Needs (11/10/2023)  Utilities: Not At Risk (11/10/2023)  Depression (PHQ2-9): High Risk (07/25/2022)  Social Connections: Socially Isolated (11/10/2023)  Tobacco Use: Medium Risk (11/10/2023)   SDOH Interventions:     Readmission Risk Interventions    11/12/2023    9:39 AM 05/29/2022   11:58 AM  Readmission Risk Prevention Plan  Transportation Screening Complete Complete  PCP or Specialist Appt within 5-7 Days Complete Complete  Home Care Screening Complete Complete  Medication Review (RN CM) Complete Complete

## 2023-11-12 NOTE — Plan of Care (Signed)

## 2023-11-12 NOTE — Evaluation (Signed)
Occupational Therapy Evaluation Patient Details Name: Felicia Kelly MRN: [id number] DOB: [birth year] Today's Date: 11/12/2023   History of Present Illness Pt admitted on 11/10/23 from [facility] memory care secondary to SOB and UTI.  Pt with hx of dementia, tremor CAD and bil THR   Clinical Impression   Pt admitted with the above diagnosis. Pt currently with functional limitations due to the deficits listed below (see OT Problem List). Prior to admit, pt was a resident at [facility] unit and receives assistance PRN for BADL tasks and functional mobility.  Pt will benefit from acute skilled OT to increase their safety and independence with ADL and functional mobility for ADL to facilitate discharge. Patient will benefit from continued inpatient follow up therapy, <3 hours/day. OT will continue to follow pt acutely.          If plan is discharge home, recommend the following: A lot of help with walking and/or transfers;A lot of help with bathing/dressing/bathroom    Functional Status Assessment  Patient has had a recent decline in their functional status and demonstrates the ability to make significant improvements in function in a reasonable and predictable amount of time.  Equipment Recommendations  Other (comment) (defer to next venue of care)       Precautions / Restrictions Precautions Precautions: Fall Restrictions Weight Bearing Restrictions Per Provider Order: No      Mobility Bed Mobility Overal bed mobility: Needs Assistance Bed Mobility: Sit to Supine       Sit to supine: Min assist   General bed mobility comments: Pt required increased time with VC for sequencing. OT assisted with bringing BLE up onto bed. Pt assisted with bring UB towards center of bed.    Transfers Overall transfer level: Needs assistance Equipment used: Rolling walker (2 wheels) Transfers: Sit to/from Stand, Bed to chair/wheelchair/BSC Sit to Stand: Max assist      Step pivot transfers: Min assist     General transfer comment: VC for hand placement with RW management. VC for sequencing and safety awareness. Manual faciliation provided to hip to activate hip extension in order to achieve an upright standing posture.      Balance Overall balance assessment: Needs assistance Sitting-balance support: No upper extremity supported, Feet supported Sitting balance-Leahy Scale: Fair     Standing balance support: Bilateral upper extremity supported, During functional activity, Reliant on assistive device for balance Standing balance-Leahy Scale: Poor        ADL either performed or assessed with clinical judgement   ADL              General ADL Comments: Pt requires max-total A for all bathing, dressing, grooming, and toileting tasks.     Vision Baseline Vision/History: 0 No visual deficits Ability to See in Adequate Light: 0 Adequate Patient Visual Report: Other (comment) (Pt did not provide any report of vision.) Additional Comments: Unable to assess vision at this visit.     Perception Perception: Not tested       Praxis Praxis: Not tested       Pertinent Vitals/Pain Pain Assessment Pain Assessment: Faces Faces Pain Scale: Hurts a little bit Pain Location: no pain location identified. Pain Descriptors / Indicators: Grimacing Pain Intervention(s): Monitored during session, Limited activity within patient's tolerance, Repositioned     Extremity/Trunk Assessment Upper Extremity Assessment Upper Extremity Assessment: Right hand dominant;Generalized weakness   Lower Extremity Assessment Lower Extremity Assessment: Defer to PT evaluation   Cervical / Trunk Assessment Cervical /  Trunk Assessment: Kyphotic   Communication Communication Communication: Difficulty communicating thoughts/reduced clarity of speech;Difficulty following commands/understanding Following commands: Follows one step commands with increased time Cueing  Techniques: Verbal cues;Tactile cues   Cognition Arousal: Alert Behavior During Therapy: Flat affect Overall Cognitive Status: History of cognitive impairments - at baseline     General Comments: delayed processing to follow single step commands     General Comments  Nursing present to complete toilet hygiene while OT assisted with sit to stand transition and functional transfer.            Home Living Family/patient expects to be discharged to:: Assisted living    Home Equipment: Agricultural consultant (2 wheels)   Additional Comments: Resident at [facility] unit      Prior Functioning/Environment Prior Level of Function : Needs assist  Cognitive Assist : Mobility (cognitive);ADLs (cognitive)     Physical Assist : Mobility (physical);ADLs (physical)     Mobility Comments: Per chart, pt uses RW ADLs Comments: Pt receives assistance for BADL tasks.        OT Problem List: Decreased strength;Decreased activity tolerance;Impaired balance (sitting and/or standing)      OT Treatment/Interventions: Self-care/ADL training;Therapeutic exercise;Therapeutic activities;Patient/family education;DME and/or AE instruction;Balance training    OT Goals(Current goals can be found in the care plan section) Acute Rehab OT Goals Patient Stated Goal: none verbalized OT Goal Formulation: Patient unable to participate in goal setting Time For Goal Achievement: 11/26/23 Potential to Achieve Goals: Fair  OT Frequency: Min 1X/week       AM-PAC OT "6 Clicks" Daily Activity     Outcome Measure Help from another person eating meals?: A Little Help from another person taking care of personal grooming?: A Little Help from another person toileting, which includes using toliet, bedpan, or urinal?: Total Help from another person bathing (including washing, rinsing, drying)?: Total Help from another person to put on and taking off regular upper body clothing?: Total Help from another  person to put on and taking off regular lower body clothing?: Total 6 Click Score: 10   End of Session Equipment Utilized During Treatment: Gait belt;Rolling walker (2 wheels) Nurse Communication:  (Nursing present during session)  Activity Tolerance: Patient tolerated treatment well Patient left: in bed;with call bell/phone within reach;with bed alarm set;with nursing/sitter in room  OT Visit Diagnosis: Muscle weakness (generalized) (M62.81)                Time: 1610-9604 OT Time Calculation (min): 13 min Charges:  OT General Charges $OT Visit: 1 Visit OT Evaluation $OT Eval Low Complexity: 1 Low  Limmie Patricia, OTR/L,CBIS  Supplemental OT - [facility] and [employer] Secure Chat Preferred    Maze Corniel, Charisse March 11/12/2023, 2:57 PM

## 2023-11-12 NOTE — Hospital Course (Addendum)
Brief Narrative:  88 year old with history of CAD, OA, HLD, HTN, hypothyroidism, DM2 comes from memory care unit for fever diagnosed with urinary tract infection started on IV Rocephin.   Assessment & Plan:  Principal Problem:   UTI (urinary tract infection) Active Problems:   Generalized weakness    UTI -Initially had fever and foul-smelling urine.  UA was abnormal.  Cultures are growing gram-negative rods, on empiric IV Rocephin.   Hypertension -continue amlodipine.  IV as needed   Hypokalemia -Replete as needed   Diabetes mellitus type 2 -Hemoglobin A1c 6.4 -Continue sliding scale insulin with NovoLog CBG well-controlled -Resume metformin at discharge   CAD/hyperlipidemia -Continue pravastatin, Zetia, aspirin - Currently chest pain-free   Hypothyroidism -Continue Synthroid -TSH 5.132   Dementia -Continue rivastigmine -Continue delirium precautions   Anxiety/depression -Continue mirtazapine, Depakote    Vitamin deficiencies -B12 level 1442 -Vitamin D level 57.03 -Will discontinue B12 supplementation     Hx lower extremity edema No evidence of heart failure. No lower extremities edema on exam. -Continue on Lasix   Transaminitis -Mild elevation of AST and ALT -Unclear etiology -Follow LFTs in a.m.   Hx herpes -Continue valacyclovir    Insomnia -Continue melatonin    Generalized debility -PT/OT eval -Continue protein supplementation   PT/OT-home health.  Face-to-face completed  DVT prophylaxis: enoxaparin (LOVENOX) injection 40 mg Start: 11/10/23 2200      Code Status: Limited: Do not attempt resuscitation (DNR) -DNR-LIMITED -Do Not Intubate/DNI  Family Communication:   Status is: Inpatient Remains inpatient appropriate because: Currently awaiting culture data     Subjective: Doing well no complaints.  Off-and-on still having some confusion.   Examination:  General exam: Appears calm and comfortable  Respiratory system: Clear to  auscultation. Respiratory effort normal. Cardiovascular system: S1 & S2 heard, RRR. No JVD, murmurs, rubs, gallops or clicks. No pedal edema. Gastrointestinal system: Abdomen is nondistended, soft and nontender. No organomegaly or masses felt. Normal bowel sounds heard. Central nervous system: Alert and oriented. No focal neurological deficits. Extremities: Symmetric 5 x 5 power. Skin: No rashes, lesions or ulcers Psychiatry: Judgement and insight appear poor

## 2023-11-12 NOTE — Progress Notes (Signed)
PROGRESS NOTE    Felicia Kelly  WUJ:[id number] DOB: [birth year] DOA: 11/10/2023 PCP: Pcp, No    Brief Narrative:  88 year old with history of CAD, OA, HLD, HTN, hypothyroidism, DM2 comes from memory care unit for fever diagnosed with urinary tract infection started on IV Rocephin.   Assessment & Plan:  Principal Problem:   UTI (urinary tract infection) Active Problems:   Generalized weakness    UTI -Initially had fever and foul-smelling urine.  UA was abnormal.  Cultures are growing gram-negative rods, on empiric IV Rocephin.   Hypertension -continue amlodipine.  IV as needed   Hypokalemia -Replete as needed   Diabetes mellitus type 2 -Hemoglobin A1c 6.4 -Continue sliding scale insulin with NovoLog CBG well-controlled -Resume metformin at discharge   CAD/hyperlipidemia -Continue pravastatin, Zetia, aspirin - Currently chest pain-free   Hypothyroidism -Continue Synthroid -TSH 5.132   Dementia -Continue rivastigmine -Continue delirium precautions   Anxiety/depression -Continue mirtazapine, Depakote    Vitamin deficiencies -B12 level 1442 -Vitamin D level 57.03 -Will discontinue B12 supplementation     Hx lower extremity edema No evidence of heart failure. No lower extremities edema on exam. -Continue on Lasix   Transaminitis -Mild elevation of AST and ALT -Unclear etiology -Follow LFTs in a.m.   Hx herpes -Continue valacyclovir    Insomnia -Continue melatonin    Generalized debility -PT/OT eval -Continue protein supplementation   PT/OT-home health.  Face-to-face completed  DVT prophylaxis: enoxaparin (LOVENOX) injection 40 mg Start: 11/10/23 2200      Code Status: Limited: Do not attempt resuscitation (DNR) -DNR-LIMITED -Do Not Intubate/DNI  Family Communication:   Status is: Inpatient Remains inpatient appropriate because: Currently awaiting culture data     Subjective: Doing well no complaints.  Off-and-on still having some  confusion.   Examination:  General exam: Appears calm and comfortable  Respiratory system: Clear to auscultation. Respiratory effort normal. Cardiovascular system: S1 & S2 heard, RRR. No JVD, murmurs, rubs, gallops or clicks. No pedal edema. Gastrointestinal system: Abdomen is nondistended, soft and nontender. No organomegaly or masses felt. Normal bowel sounds heard. Central nervous system: Alert and oriented. No focal neurological deficits. Extremities: Symmetric 5 x 5 power. Skin: No rashes, lesions or ulcers Psychiatry: Judgement and insight appear poor                Diet Orders (From admission, onward)     Start     Ordered   11/10/23 1627  Diet Carb Modified Fluid consistency: Thin; Room service appropriate? Yes  Diet effective now       Question Answer Comment  Diet-HS Snack? Nothing   Calorie Level Medium 1600-2000   Fluid consistency: Thin   Room service appropriate? Yes      11/10/23 1626            Objective: Vitals:   11/11/23 2101 11/11/23 2316 11/12/23 0620 11/12/23 1312  BP: (!) 142/68  126/65 119/65  Pulse: 86  75 79  Resp: 20  14 14   Temp: (!) 101.2 F (38.4 C) 98.1 F (36.7 C) 98.7 F (37.1 C) 98.6 F (37 C)  TempSrc: Oral Oral Oral   SpO2: 94%  95% 94%  Weight:      Height:        Intake/Output Summary (Last 24 hours) at 11/12/2023 1351 Last data filed at 11/12/2023 1314 Gross per 24 hour  Intake 931.14 ml  Output 500 ml  Net 431.14 ml   Filed Weights   11/10/23 1128  Weight: 68 kg  Scheduled Meds:  acidophilus  1 capsule Oral QHS   amLODipine  5 mg Oral QHS   aspirin EC  81 mg Oral QHS   cholecalciferol  1,000 Units Oral Q M,W,F   divalproex  125 mg Oral BID   enoxaparin (LOVENOX) injection  40 mg Subcutaneous Q24H   ezetimibe  10 mg Oral Daily   furosemide  20 mg Oral Q breakfast   insulin aspart  0-15 Units Subcutaneous Q4H   levothyroxine  100 mcg Oral Q Sun   levothyroxine  50 mcg Oral Once per day on Monday  Tuesday Wednesday Thursday Friday Saturday   magnesium oxide  200 mg Oral Daily   melatonin  1.5 mg Oral QHS   mirtazapine  30 mg Oral QHS   multivitamin with minerals  1 tablet Oral Q breakfast   potassium chloride  10 mEq Oral Daily   pravastatin  20 mg Oral QHS   Ensure Max Protein  237 mL Oral Daily   psyllium  1 packet Oral QHS   rivastigmine  6 mg Oral BID   valACYclovir  500 mg Oral Daily   Continuous Infusions:  cefTRIAXone (ROCEPHIN)  IV Stopped (11/11/23 1624)    Nutritional status     Body mass index is 30.3 kg/m.  Data Reviewed:   CBC: Recent Labs  Lab 11/10/23 1336 11/11/23 0317  WBC 10.9* 9.7  NEUTROABS 8.6*  --   HGB 11.6* 10.4*  HCT 37.2 33.9*  MCV 90.7 93.4  PLT 336 327   Basic Metabolic Panel: Recent Labs  Lab 11/10/23 1336 11/10/23 1935 11/11/23 0317  NA 138  --  141  K 3.4*  --  3.9  CL 102  --  107  CO2 26  --  24  GLUCOSE 146*  --  108*  BUN 23  --  21  CREATININE 0.96  --  0.90  CALCIUM 8.8*  --  8.7*  MG  --  2.0  --   PHOS  --  3.3  --    GFR: Estimated Creatinine Clearance: 38.3 mL/min (by C-G formula based on SCr of 0.9 mg/dL). Liver Function Tests: Recent Labs  Lab 11/10/23 1336 11/12/23 0853  AST 120* 156*  ALT 113* 182*  ALKPHOS 91 91  BILITOT 0.6 0.3  PROT 7.6 6.4*  ALBUMIN 2.9* 2.5*   No results for input(s): "LIPASE", "AMYLASE" in the last 168 hours. No results for input(s): "AMMONIA" in the last 168 hours. Coagulation Profile: No results for input(s): "INR", "PROTIME" in the last 168 hours. Cardiac Enzymes: No results for input(s): "CKTOTAL", "CKMB", "CKMBINDEX", "TROPONINI" in the last 168 hours. BNP (last 3 results) No results for input(s): "PROBNP" in the last 8760 hours. HbA1C: No results for input(s): "HGBA1C" in the last 72 hours. CBG: Recent Labs  Lab 11/11/23 2028 11/11/23 2348 11/12/23 0345 11/12/23 0724 11/12/23 1122  GLUCAP 96 105* 103* 101* 118*   Lipid Profile: No results for  input(s): "CHOL", "HDL", "LDLCALC", "TRIG", "CHOLHDL", "LDLDIRECT" in the last 72 hours. Thyroid Function Tests: Recent Labs    11/10/23 1935  TSH 5.132*   Anemia Panel: Recent Labs    11/10/23 1935  VITAMINB12 1,442*   Sepsis Labs: Recent Labs  Lab 11/10/23 1345  LATICACIDVEN 1.6    Recent Results (from the past 240 hours)  Culture, blood (routine x 2)     Status: None (Preliminary result)   Collection Time: 11/10/23  1:36 PM   Specimen: BLOOD  Result Value Ref Range  Status   Specimen Description BLOOD LEFT ANTECUBITAL  Final   Special Requests   Final    BOTTLES DRAWN AEROBIC AND ANAEROBIC Blood Culture adequate volume   Culture   Final    NO GROWTH 2 DAYS Performed at [facility], [address], [place], [place] [address]    Report Status PENDING  Incomplete  Resp panel by RT-PCR (RSV, Flu A&B, Covid) Anterior Nasal Swab     Status: None   Collection Time: 11/10/23  1:46 PM   Specimen: Anterior Nasal Swab  Result Value Ref Range Status   SARS Coronavirus 2 by RT PCR NEGATIVE NEGATIVE Final    Comment: (NOTE) SARS-CoV-2 target nucleic acids are NOT DETECTED.  The SARS-CoV-2 RNA is generally detectable in upper respiratory specimens during the acute phase of infection. The lowest concentration of SARS-CoV-2 viral copies this assay can detect is 138 copies/mL. A negative result does not preclude SARS-Cov-2 infection and should not be used as the sole basis for treatment or other patient management decisions. A negative result may occur with  improper specimen collection/handling, submission of specimen other than nasopharyngeal swab, presence of viral mutation(s) within the areas targeted by this assay, and inadequate number of viral copies(<138 copies/mL). A negative result must be combined with clinical observations, patient history, and epidemiological information. The expected result is Negative.  Fact Sheet for Patients:   BloggerCourse.com  Fact Sheet for Healthcare Providers:  SeriousBroker.it  This test is no t yet approved or cleared by the [place] FDA and  has been authorized for detection and/or diagnosis of SARS-CoV-2 by FDA under an Emergency Use Authorization (EUA). This EUA will remain  in effect (meaning this test can be used) for the duration of the COVID-19 declaration under Section 564(b)(1) of the Act, 21 U.S.C.section 360bbb-3(b)(1), unless the authorization is terminated  or revoked sooner.       Influenza A by PCR NEGATIVE NEGATIVE Final   Influenza B by PCR NEGATIVE NEGATIVE Final    Comment: (NOTE) The Xpert Xpress SARS-CoV-2/FLU/RSV plus assay is intended as an aid in the diagnosis of influenza from Nasopharyngeal swab specimens and should not be used as a sole basis for treatment. Nasal washings and aspirates are unacceptable for Xpert Xpress SARS-CoV-2/FLU/RSV testing.  Fact Sheet for Patients: BloggerCourse.com  Fact Sheet for Healthcare Providers: SeriousBroker.it  This test is not yet approved or cleared by the [place] FDA and has been authorized for detection and/or diagnosis of SARS-CoV-2 by FDA under an Emergency Use Authorization (EUA). This EUA will remain in effect (meaning this test can be used) for the duration of the COVID-19 declaration under Section 564(b)(1) of the Act, 21 U.S.C. section 360bbb-3(b)(1), unless the authorization is terminated or revoked.     Resp Syncytial Virus by PCR NEGATIVE NEGATIVE Final    Comment: (NOTE) Fact Sheet for Patients: BloggerCourse.com  Fact Sheet for Healthcare Providers: SeriousBroker.it  This test is not yet approved or cleared by the [place] FDA and has been authorized for detection and/or diagnosis of SARS-CoV-2 by FDA under an Emergency Use  Authorization (EUA). This EUA will remain in effect (meaning this test can be used) for the duration of the COVID-19 declaration under Section 564(b)(1) of the Act, 21 U.S.C. section 360bbb-3(b)(1), unless the authorization is terminated or revoked.  Performed at [facility], [address] [place], [place] [address]   Culture, blood (routine x 2)     Status: None (Preliminary result)  Collection Time: 11/10/23  1:46 PM   Specimen: BLOOD  Result Value Ref Range Status   Specimen Description   Final    BLOOD RIGHT ANTECUBITAL Performed at [facility], [address] [place], [place] [address]    Special Requests   Final    BOTTLES DRAWN AEROBIC AND ANAEROBIC Blood Culture adequate volume Performed at [facility], [address], [place], [place] [address]    Culture   Final    NO GROWTH 2 DAYS Performed at [facility], [address] [place], [place] [address]    Report Status PENDING  Incomplete  Urine Culture     Status: Abnormal (Preliminary result)   Collection Time: 11/10/23  2:46 PM   Specimen: Urine, Random  Result Value Ref Range Status   Specimen Description   Final    URINE, RANDOM Performed at [facility], [address] [place], [place] [address]    Special Requests   Final    NONE Reflexed from [id number] Performed at [facility], [address] [place], [place] [address]    Culture >=100,000 COLONIES/mL GRAM NEGATIVE RODS (A)  Final   Report Status PENDING  Incomplete         Radiology Studies: No results found.         LOS: 1 day   Time spent= 35 mins    Miguel Rota, MD [facility]  If 7PM-7AM, please contact night-coverage  11/12/2023, 1:51 PM

## 2023-11-13 ENCOUNTER — Other Ambulatory Visit (HOSPITAL_COMMUNITY): Payer: Self-pay

## 2023-11-13 DIAGNOSIS — N3000 Acute cystitis without hematuria: Secondary | ICD-10-CM | POA: Diagnosis not present

## 2023-11-13 LAB — CBC
HCT: 35.6 % — ABNORMAL LOW (ref 36.0–46.0)
Hemoglobin: 11 g/dL — ABNORMAL LOW (ref 12.0–15.0)
MCH: 28.4 pg (ref 26.0–34.0)
MCHC: 30.9 g/dL (ref 30.0–36.0)
MCV: 91.8 fL (ref 80.0–100.0)
Platelets: 356 10*3/uL (ref 150–400)
RBC: 3.88 MIL/uL (ref 3.87–5.11)
RDW: 13.5 % (ref 11.5–15.5)
WBC: 7.8 10*3/uL (ref 4.0–10.5)
nRBC: 0 % (ref 0.0–0.2)

## 2023-11-13 LAB — COMPREHENSIVE METABOLIC PANEL
ALT: 146 U/L — ABNORMAL HIGH (ref 0–44)
AST: 106 U/L — ABNORMAL HIGH (ref 15–41)
Albumin: 2.7 g/dL — ABNORMAL LOW (ref 3.5–5.0)
Alkaline Phosphatase: 90 U/L (ref 38–126)
Anion gap: 11 (ref 5–15)
BUN: 21 mg/dL (ref 8–23)
CO2: 22 mmol/L (ref 22–32)
Calcium: 8.6 mg/dL — ABNORMAL LOW (ref 8.9–10.3)
Chloride: 105 mmol/L (ref 98–111)
Creatinine, Ser: 0.63 mg/dL (ref 0.44–1.00)
GFR, Estimated: >60 mL/min (ref >60)
Glucose, Bld: 92 mg/dL (ref 70–99)
Potassium: 3.1 mmol/L — ABNORMAL LOW (ref 3.5–5.1)
Sodium: 138 mmol/L (ref 135–145)
Total Bilirubin: 0.5 mg/dL (ref 0.0–1.2)
Total Protein: 6.5 g/dL (ref 6.5–8.1)

## 2023-11-13 LAB — GLUCOSE, CAPILLARY
Glucose-Capillary: 108 mg/dL — ABNORMAL HIGH (ref 70–99)
Glucose-Capillary: 115 mg/dL — ABNORMAL HIGH (ref 70–99)
Glucose-Capillary: 136 mg/dL — ABNORMAL HIGH (ref 70–99)
Glucose-Capillary: 85 mg/dL (ref 70–99)
Glucose-Capillary: 94 mg/dL (ref 70–99)

## 2023-11-13 LAB — URINE CULTURE

## 2023-11-13 LAB — MAGNESIUM: Magnesium: 2.1 mg/dL (ref 1.7–2.4)

## 2023-11-13 LAB — PHOSPHORUS: Phosphorus: 3.8 mg/dL (ref 2.5–4.6)

## 2023-11-13 MED ORDER — CEPHALEXIN 500 MG PO CAPS
500.0000 mg | ORAL_CAPSULE | Freq: Four times a day (QID) | ORAL | 0 refills | Status: AC
  Filled 2023-11-13: qty 12, 3d supply, fill #0

## 2023-11-13 NOTE — Plan of Care (Signed)
  Problem: Metabolic: Goal: Ability to maintain appropriate glucose levels will improve Outcome: Progressing   Problem: Nutritional: Goal: Maintenance of adequate nutrition will improve Outcome: Progressing Goal: Progress toward achieving an optimal weight will improve Outcome: Progressing   Problem: Skin Integrity: Goal: Risk for impaired skin integrity will decrease Outcome: Progressing   Problem: Tissue Perfusion: Goal: Adequacy of tissue perfusion will improve Outcome: Progressing   Problem: Clinical Measurements: Goal: Respiratory complications will improve Outcome: Progressing Goal: Cardiovascular complication will be avoided Outcome: Progressing   Problem: Activity: Goal: Risk for activity intolerance will decrease Outcome: Progressing   Problem: Nutrition: Goal: Adequate nutrition will be maintained Outcome: Progressing   Problem: Coping: Goal: Level of anxiety will decrease Outcome: Progressing   Problem: Pain Managment: Goal: General experience of comfort will improve and/or be controlled Outcome: Progressing   Problem: Safety: Goal: Ability to remain free from injury will improve Outcome: Progressing   Problem: Skin Integrity: Goal: Risk for impaired skin integrity will decrease Outcome: Progressing

## 2023-11-13 NOTE — TOC Transition Note (Signed)
Transition of Care [facility]) - Discharge Note   Patient Details  Name: Felicia Kelly MRN: [id number] Date of Birth: October 16, 1936  Transition of Care [facility]) CM/SW Contact:  Lanier Clam, RN Phone Number: 11/13/2023, 1:02 PM   Clinical Narrative:  faxed to [facility] greens-memory care;fl2 awaiting MD signature to fax to [facility] rep Joy fax#[phone];tel#[phone]; HHPT/[facility] to fax orders to [facility] also. PTAR for d/c.    Final next level of care: Memory Care Barriers to Discharge: No Barriers Identified   Patient Goals and CMS Choice Patient states their goals for this hospitalization and ongoing recovery are:: per pt's sister [name]-POA), return to [facility] Memory Care          Discharge Placement                       Discharge Plan and Services Additional resources added to the After Visit Summary for                            [facility] Arranged: PT          Social Drivers of Health (SDOH) Interventions SDOH Screenings   Food Insecurity: No Food Insecurity (11/10/2023)  Housing: Low Risk  (11/10/2023)  Transportation Needs: No Transportation Needs (11/10/2023)  Utilities: Not At Risk (11/10/2023)  Depression (PHQ2-9): High Risk (07/25/2022)  Social Connections: Socially Isolated (11/10/2023)  Tobacco Use: Medium Risk (11/10/2023)     Readmission Risk Interventions    11/12/2023    9:39 AM 05/29/2022   11:58 AM  Readmission Risk Prevention Plan  Transportation Screening Complete Complete  PCP or Specialist Appt within 5-7 Days Complete Complete  Home Care Screening Complete Complete  Medication Review (RN CM) Complete Complete

## 2023-11-13 NOTE — NC FL2 (Signed)
[place] MEDICAID FL2 LEVEL OF CARE FORM     IDENTIFICATION  Patient Name: Felicia Kelly Birthdate: 11/28/1935 Sex: female Admission Date (Current Location): 11/10/2023  [facility] and [place] Number:  Producer, television/film/video and Address:  [facility],  [address], [place] [address]      Provider Number: [id number]  Attending Physician Name and Address:  Miguel Rota, MD  Relative Name and Phone Number:  [name](sister)[phone]    Current Level of Care: Hospital Recommended Level of Care: Memory Care Prior Approval Number:    Date Approved/Denied:   PASRR Number:    Discharge Plan: Other (Comment) (Memory care)    Current Diagnoses: Patient Active Problem List   Diagnosis Date Noted   UTI (urinary tract infection) 11/10/2023   Generalized weakness 11/10/2023   Impacted cerumen of both ears 07/17/2023   Depression, major, single episode, mild (HCC) 07/26/2022   Weight loss, unintentional 06/08/2022   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   2019 novel coronavirus disease (COVID-19) 05/28/2022   Hypocalcemia 05/28/2022   Hypoglycemia 05/28/2022   SAH (subarachnoid hemorrhage) (HCC) 02/24/2020   Bradycardia 04/01/2019   Cystocele 02/13/2019   Hip pain 02/13/2019   Osteoarthritis of hip 02/13/2019   Subchondral cysts 02/13/2019   Vitamin D deficiency 02/13/2019   Aortic atherosclerosis (HCC) 10/14/2017   Solitary pulmonary nodule 10/23/2016   Hyperglycemia 07/11/2016   Hypercholesterolemia 06/04/2013   Hypothyroidism 06/04/2013   Osteopenia 06/27/2012   Malignant basal cell neoplasm of skin 02/14/2012   Benign essential hypertension 11/22/2011   Internal carotid artery stenosis 09/18/2011   Multinodular goiter 10/16/2009   Herpes simplex type 2 infection 10/05/2008   Single renal cyst 02/14/2008    Orientation RESPIRATION BLADDER Height & Weight     Self    Incontinent Weight: 68 kg Height:  4\' 11"  (149.9 cm)  BEHAVIORAL  SYMPTOMS/MOOD NEUROLOGICAL BOWEL NUTRITION STATUS      Incontinent Diet (CHO MOD)  AMBULATORY STATUS COMMUNICATION OF NEEDS Skin   Extensive Assist Verbally Normal                       Personal Care Assistance Level of Assistance  Bathing, Feeding, Dressing Bathing Assistance: Maximum assistance Feeding assistance: Limited assistance Dressing Assistance: Maximum assistance     Functional Limitations Info  Sight, Hearing, Speech Sight Info: Adequate   Speech Info: Adequate    SPECIAL CARE FACTORS FREQUENCY  PT (By licensed PT), OT (By licensed OT)                    Contractures Contractures Info: Not present    Additional Factors Info  Code Status, Allergies Code Status Info: DNR Allergies Info: Tetracycline, Atenolol, Atorvastatin, Bactrim (Sulfamethoxazole-trimethoprim), Celecoxib, Niacin, Rosuvastatin, Simvastatin, Simvastatin, Tetracyclines & Related, Penicillins           Current Medications (11/13/2023):  This is the current hospital active medication list Current Facility-Administered Medications  Medication Dose Route Frequency Provider Last Rate Last Admin   acetaminophen (TYLENOL) tablet 650 mg  650 mg Oral Q6H PRN Steffanie Rainwater, MD   650 mg at 11/11/23 2137   Or   acetaminophen (TYLENOL) suppository 650 mg  650 mg Rectal Q6H PRN Steffanie Rainwater, MD       acidophilus (RISAQUAD) capsule 1 capsule  1 capsule Oral QHS Steffanie Rainwater, MD   1 capsule at 11/12/23 2220   amLODipine (NORVASC) tablet 5 mg  5 mg Oral QHS  Steffanie Rainwater, MD   5 mg at 11/12/23 2220   aspirin EC tablet 81 mg  81 mg Oral QHS Steffanie Rainwater, MD   81 mg at 11/12/23 2220   cefTRIAXone (ROCEPHIN) 1 g in sodium chloride 0.9 % 100 mL IVPB  1 g Intravenous Q24H Meredeth Ide, MD 200 mL/hr at 11/12/23 1411 1 g at 11/12/23 1411   cholecalciferol (VITAMIN D3) 25 MCG (1000 UNIT) tablet 1,000 Units  1,000 Units Oral Q M,W,F Steffanie Rainwater, MD   1,000 Units at  11/12/23 0943   divalproex (DEPAKOTE) DR tablet 125 mg  125 mg Oral BID Steffanie Rainwater, MD   125 mg at 11/13/23 0948   enoxaparin (LOVENOX) injection 40 mg  40 mg Subcutaneous Q24H Steffanie Rainwater, MD   40 mg at 11/12/23 2216   ezetimibe (ZETIA) tablet 10 mg  10 mg Oral Daily Steffanie Rainwater, MD   10 mg at 11/13/23 1610   furosemide (LASIX) tablet 20 mg  20 mg Oral Q breakfast Steffanie Rainwater, MD   20 mg at 11/13/23 0949   guaiFENesin (ROBITUSSIN) 100 MG/5ML liquid 5 mL  5 mL Oral Q4H PRN Amin, Ankit C, MD       hydrALAZINE (APRESOLINE) injection 10 mg  10 mg Intravenous Q4H PRN Amin, Ankit C, MD       insulin aspart (novoLOG) injection 0-15 Units  0-15 Units Subcutaneous Q4H Amponsah, Flossie Buffy, MD       ipratropium-albuterol (DUONEB) 0.5-2.5 (3) MG/3ML nebulizer solution 3 mL  3 mL Nebulization Q4H PRN Amin, Ankit C, MD       levothyroxine (SYNTHROID) tablet 100 mcg  100 mcg Oral Q Sun Amponsah, Prosper M, MD   100 mcg at 11/11/23 0540   levothyroxine (SYNTHROID) tablet 50 mcg  50 mcg Oral Once per day on Monday Tuesday Wednesday Thursday Friday Saturday Herby Abraham, RPH   50 mcg at 11/13/23 0543   magnesium oxide (MAG-OX) tablet 200 mg  200 mg Oral Daily Herby Abraham, RPH   200 mg at 11/13/23 9604   melatonin tablet 1.5 mg  1.5 mg Oral QHS Steffanie Rainwater, MD   1.5 mg at 11/12/23 2216   metoprolol tartrate (LOPRESSOR) injection 5 mg  5 mg Intravenous Q4H PRN Amin, Ankit C, MD       mirtazapine (REMERON) tablet 30 mg  30 mg Oral QHS Steffanie Rainwater, MD   30 mg at 11/12/23 2220   multivitamin with minerals tablet 1 tablet  1 tablet Oral Q breakfast Steffanie Rainwater, MD   1 tablet at 11/13/23 0953   ondansetron (ZOFRAN) tablet 4 mg  4 mg Oral Q6H PRN Steffanie Rainwater, MD       Or   ondansetron [facility]) injection 4 mg  4 mg Intravenous Q6H PRN Steffanie Rainwater, MD       potassium chloride (KLOR-CON M) CR tablet 10 mEq  10 mEq Oral Daily Steffanie Rainwater, MD   10 mEq at 11/13/23 5409   pravastatin (PRAVACHOL) tablet 20 mg  20 mg Oral QHS Luiz Iron, NP       protein supplement (ENSURE MAX) liquid  237 mL Oral Daily Steffanie Rainwater, MD   237 mL at 11/13/23 0948   psyllium (HYDROCIL/METAMUCIL) 1 packet  1 packet Oral QHS Herby Abraham, RPH   1 packet at 11/11/23 2141   rivastigmine (EXELON) capsule 6 mg  6 mg  Oral BID Steffanie Rainwater, MD   6 mg at 11/13/23 4098   senna-docusate (Senokot-S) tablet 1 tablet  1 tablet Oral QHS PRN Steffanie Rainwater, MD       traZODone (DESYREL) tablet 50 mg  50 mg Oral QHS PRN Amin, Ankit C, MD       valACYclovir (VALTREX) tablet 500 mg  500 mg Oral Daily Steffanie Rainwater, MD   500 mg at 11/13/23 1191     Discharge Medications: Please see discharge summary for a list of discharge medications.  Relevant Imaging Results:  Relevant Lab Results:   Additional Information SS[address][id number], [place], [place]

## 2023-11-13 NOTE — Discharge Summary (Signed)
Physician Discharge Summary  [facility] JOA:[id number] DOB: 1936/06/13 DOA: 11/10/2023  PCP: Pcp, No  Admit date: 11/10/2023 Discharge date: 11/13/2023  Admitted From: Home Disposition: Home  Recommendations for Outpatient Follow-up:  Follow up with PCP in 1-2 weeks Please obtain BMP/CBC in one week your next doctors visit.  P.o. Keflex for 3 more days   Discharge Condition: Stable CODE STATUS: DNR Diet recommendation: Heart healthy  Brief/Interim Summary: Brief Narrative:  88 year old with history of CAD, OA, HLD, HTN, hypothyroidism, DM2 comes from memory care unit for fever diagnosed with urinary tract infection started on IV Rocephin.  Eventually urine cultures grew Proteus and Klebsiella which was sensitive to multiple antibiotics including Keflex.  Will transition to p.o. Keflex and discharge her today.   Assessment & Plan:  Principal Problem:   UTI (urinary tract infection) Active Problems:   Generalized weakness    UTI - Urine cultures are growing Proteus, klebsilella. Change to PO Keflex.    Hypertension -continue amlodipine.     Hypokalemia -Replete as needed   Diabetes mellitus type 2 -Hemoglobin A1c 6.4 -Resume metformin at discharge   CAD/hyperlipidemia -Continue pravastatin, Zetia, aspirin - Currently chest pain-free   Hypothyroidism -Continue Synthroid -TSH 5.132   Dementia -Continue rivastigmine -Continue delirium precautions   Anxiety/depression -Continue mirtazapine, Depakote    Vitamin deficiencies Resume home regimen     Hx lower extremity edema No evidence of heart failure. No lower extremities edema on exam. -Continue on Lasix   Transaminitis LFTs improving.   Hx herpes -Continue valacyclovir    Insomnia -Continue melatonin    Generalized debility -PT/OT eval -Continue protein supplementation   PT/OT-home health.  Face-to-face completed  DVT prophylaxis: enoxaparin (LOVENOX) injection 40 mg Start: 11/10/23  2200      Code Status: Limited: Do not attempt resuscitation (DNR) -DNR-LIMITED -Do Not Intubate/DNI  Family Communication: Sister updated Status is: Inpatient Remains inpatient appropriate because: Discharge today   Subjective: Doing very well no complaints.  Wanting to go home today Examination:  General exam: Appears calm and comfortable  Respiratory system: Clear to auscultation. Respiratory effort normal. Cardiovascular system: S1 & S2 heard, RRR. No JVD, murmurs, rubs, gallops or clicks. No pedal edema. Gastrointestinal system: Abdomen is nondistended, soft and nontender. No organomegaly or masses felt. Normal bowel sounds heard. Central nervous system: Alert and oriented. No focal neurological deficits. Extremities: Symmetric 5 x 5 power. Skin: No rashes, lesions or ulcers Psychiatry: Judgement and insight appear poor    Discharge Diagnoses:  Principal Problem:   UTI (urinary tract infection) Active Problems:   Generalized weakness      Discharge Exam: Vitals:   11/12/23 2040 11/13/23 0510  BP: 134/63 139/68  Pulse: 71 70  Resp: 18 18  Temp: 99.8 F (37.7 C) 97.7 F (36.5 C)  SpO2: 95% 95%   Vitals:   11/12/23 0620 11/12/23 1312 11/12/23 2040 11/13/23 0510  BP: 126/65 119/65 134/63 139/68  Pulse: 75 79 71 70  Resp: 14 14 18 18   Temp: 98.7 F (37.1 C) 98.6 F (37 C) 99.8 F (37.7 C) 97.7 F (36.5 C)  TempSrc: Oral  Oral Oral  SpO2: 95% 94% 95% 95%  Weight:      Height:          Discharge Instructions  Discharge Instructions     meds to beds pharmacy consult ([facility] ONLY)   Complete by: As directed       Allergies as of 11/13/2023       Reactions  Tetracycline Itching   Atenolol Other (See Comments)   Low blood pressure and slow heart rate   Atorvastatin Other (See Comments)   Leg cramps - "Allergic," per MAR   Bactrim [sulfamethoxazole-trimethoprim] Itching   Celecoxib Other (See Comments)   Altered, Memory disturbance,  slowed thinking - "Allergic," per MAR   Niacin Other (See Comments), Hypertension   Elevated BP- "Allergic," per MAR   Rosuvastatin Other (See Comments)   Elevated CPK - "Allergic," per MAR   Simvastatin Other (See Comments)   Leg cramps   Simvastatin Other (See Comments)   Leg cramps   Tetracyclines & Related Itching   Penicillins Swelling, Rash, Other (See Comments)   ALL -CILLINS, pt's sister states "swelling" reported is related to the rash she gets.  Patient tolerates cephalosporins.        Medication List     TAKE these medications    acetaminophen 500 MG tablet Commonly known as: TYLENOL Take 500 mg by mouth every 8 (eight) hours as needed (for pain).   Align 4 MG Caps Take 4 mg by mouth at bedtime.   amLODipine 5 MG tablet Commonly known as: NORVASC TAKE ONE TABLET BY MOUTH AT BEDTIME   aspirin EC 81 MG tablet Take 81 mg by mouth every evening. Swallow whole.   cephALEXin 500 MG capsule Commonly known as: KEFLEX Take 1 capsule (500 mg total) by mouth 4 (four) times daily for 3 days.   CORICIDIN COUGH/COLD 4-30 MG Tabs Generic drug: Chlorpheniramine-DM Take 1 tablet by mouth every 6 (six) hours as needed (for cough and/or congestion).   cyanocobalamin 100 MCG tablet Take 100 mcg by mouth daily.   DAILY-VITE MULTIVITAMIN PO Take 1 tablet by mouth daily with breakfast.   divalproex 125 MG DR tablet Commonly known as: Depakote Take 1 tablet (125 mg total) by mouth 2 (two) times daily.   Ensure Original Liqd Take 237 mLs by mouth See admin instructions. ENSURE CHOCOLATE LIQUID DRINK: Drink 237 ml's by mouth in the morning   ezetimibe 10 MG tablet Commonly known as: ZETIA Take 1 tablet (10 mg total) by mouth daily.   furosemide 20 MG tablet Commonly known as: LASIX Take 20 mg by mouth in the morning.   GNP Healthy Eyes SuperVision Caps Take 1 capsule by mouth in the morning.   levothyroxine 50 MCG tablet Commonly known as: SYNTHROID Take 1 tablet  by mouth 6 days a week, then take 2 tablets on Sundays. 30 days What changed: See the new instructions.   Magnesium 250 MG Tabs Take 250 mg by mouth daily.   melatonin 1 MG Tabs tablet Take 1 mg by mouth at bedtime.   metFORMIN 500 MG 24 hr tablet Commonly known as: GLUCOPHAGE-XR Take 1 tablet (500 mg total) by mouth daily with breakfast.   mirtazapine 30 MG tablet Commonly known as: REMERON TAKE 1 TABLET BY MOUTH DAILY AT BEDTIME **NO REFILLS** What changed: See the new instructions.   potassium chloride 10 MEQ tablet Commonly known as: KLOR-CON M Take 1 tablet (10 mEq total) by mouth daily.   pravastatin 20 MG tablet Commonly known as: PRAVACHOL Take 20 mg by mouth at bedtime.   psyllium 0.52 g capsule Commonly known as: REGULOID Take 0.52 g by mouth every evening.   rivastigmine 6 MG capsule Commonly known as: EXELON Take 1 capsule (6 mg total) by mouth 2 (two) times daily.   valACYclovir 500 MG tablet Commonly known as: VALTREX Take 1 tablet (500 mg total) by  mouth daily.   Vitamin D-3 25 MCG (1000 UT) Caps Take 1,000 Units by mouth every Monday, Wednesday, and Friday.        Allergies  Allergen Reactions   Tetracycline Itching   Atenolol Other (See Comments)    Low blood pressure and slow heart rate   Atorvastatin Other (See Comments)    Leg cramps - "Allergic," per MAR   Bactrim [Sulfamethoxazole-Trimethoprim] Itching   Celecoxib Other (See Comments)    Altered, Memory disturbance, slowed thinking - "Allergic," per MAR   Niacin Other (See Comments) and Hypertension    Elevated BP- "Allergic," per MAR   Rosuvastatin Other (See Comments)    Elevated CPK - "Allergic," per MAR   Simvastatin Other (See Comments)    Leg cramps    Simvastatin Other (See Comments)    Leg cramps   Tetracyclines & Related Itching   Penicillins Swelling, Rash and Other (See Comments)    ALL -CILLINS, pt's sister states "swelling" reported is related to the rash she gets.   Patient tolerates cephalosporins.    You were cared for by a hospitalist during your hospital stay. If you have any questions about your discharge medications or the care you received while you were in the hospital after you are discharged, you can call the unit and asked to speak with the hospitalist on call if the hospitalist that took care of you is not available. Once you are discharged, your primary care physician will handle any further medical issues. Please note that no refills for any discharge medications will be authorized once you are discharged, as it is imperative that you return to your primary care physician (or establish a relationship with a primary care physician if you do not have one) for your aftercare needs so that they can reassess your need for medications and monitor your lab values.  You were cared for by a hospitalist during your hospital stay. If you have any questions about your discharge medications or the care you received while you were in the hospital after you are discharged, you can call the unit and asked to speak with the hospitalist on call if the hospitalist that took care of you is not available. Once you are discharged, your primary care physician will handle any further medical issues. Please note that NO REFILLS for any discharge medications will be authorized once you are discharged, as it is imperative that you return to your primary care physician (or establish a relationship with a primary care physician if you do not have one) for your aftercare needs so that they can reassess your need for medications and monitor your lab values.  Please request your Prim.MD to go over all Hospital Tests and Procedure/Radiological results at the follow up, please get all Hospital records sent to your Prim MD by signing hospital release before you go home.  Get CBC, CMP, 2 view Chest X ray checked  by Primary MD during your next visit or SNF MD in 5-7 days ( we routinely  change or add medications that can affect your baseline labs and fluid status, therefore we recommend that you get the mentioned basic workup next visit with your PCP, your PCP may decide not to get them or add new tests based on their clinical decision)  On your next visit with your primary care physician please Get Medicines reviewed and adjusted.  If you experience worsening of your admission symptoms, develop shortness of breath, life threatening emergency, suicidal or homicidal thoughts  you must seek medical attention immediately by calling 911 or calling your MD immediately  if symptoms less severe.  You Must read complete instructions/literature along with all the possible adverse reactions/side effects for all the Medicines you take and that have been prescribed to you. Take any new Medicines after you have completely understood and accpet all the possible adverse reactions/side effects.   Do not drive, operate heavy machinery, perform activities at heights, swimming or participation in water activities or provide baby sitting services if your were admitted for syncope or siezures until you have seen by Primary MD or a Neurologist and advised to do so again.  Do not drive when taking Pain medications.   Procedures/Studies: DG Chest Port 1 View Result Date: 11/10/2023 CLINICAL DATA:  Shortness of breath. EXAM: PORTABLE CHEST 1 VIEW COMPARISON:  Chest radiograph dated June 02, 2022. FINDINGS: Patient is rotated to the right. The heart size and mediastinal contours are otherwise within normal limits. Mild bibasilar atelectasis with possible trace bilateral pleural effusions. No focal consolidation. No pneumothorax. No acute osseous abnormality. IMPRESSION: Mild bibasilar atelectasis with possible trace bilateral pleural effusions. Electronically Signed   By: Hart Robinsons M.D.   On: 11/10/2023 14:03     The results of significant diagnostics from this hospitalization (including imaging,  microbiology, ancillary and laboratory) are listed below for reference.     Microbiology: Recent Results (from the past 240 hours)  Culture, blood (routine x 2)     Status: None (Preliminary result)   Collection Time: 11/10/23  1:36 PM   Specimen: BLOOD  Result Value Ref Range Status   Specimen Description BLOOD LEFT ANTECUBITAL  Final   Special Requests   Final    BOTTLES DRAWN AEROBIC AND ANAEROBIC Blood Culture adequate volume   Culture   Final    NO GROWTH 3 DAYS Performed at [facility], [address], [place], [place] [address]    Report Status PENDING  Incomplete  Resp panel by RT-PCR (RSV, Flu A&B, Covid) Anterior Nasal Swab     Status: None   Collection Time: 11/10/23  1:46 PM   Specimen: Anterior Nasal Swab  Result Value Ref Range Status   SARS Coronavirus 2 by RT PCR NEGATIVE NEGATIVE Final    Comment: (NOTE) SARS-CoV-2 target nucleic acids are NOT DETECTED.  The SARS-CoV-2 RNA is generally detectable in upper respiratory specimens during the acute phase of infection. The lowest concentration of SARS-CoV-2 viral copies this assay can detect is 138 copies/mL. A negative result does not preclude SARS-Cov-2 infection and should not be used as the sole basis for treatment or other patient management decisions. A negative result may occur with  improper specimen collection/handling, submission of specimen other than nasopharyngeal swab, presence of viral mutation(s) within the areas targeted by this assay, and inadequate number of viral copies(<138 copies/mL). A negative result must be combined with clinical observations, patient history, and epidemiological information. The expected result is Negative.  Fact Sheet for Patients:  BloggerCourse.com  Fact Sheet for Healthcare Providers:  SeriousBroker.it  This test is no t yet approved or cleared by the [place] FDA and  has been authorized for detection  and/or diagnosis of SARS-CoV-2 by FDA under an Emergency Use Authorization (EUA). This EUA will remain  in effect (meaning this test can be used) for the duration of the COVID-19 declaration under Section 564(b)(1) of the Act, 21 U.S.C.section 360bbb-3(b)(1), unless the authorization is terminated  or revoked sooner.  Influenza A by PCR NEGATIVE NEGATIVE Final   Influenza B by PCR NEGATIVE NEGATIVE Final    Comment: (NOTE) The Xpert Xpress SARS-CoV-2/FLU/RSV plus assay is intended as an aid in the diagnosis of influenza from Nasopharyngeal swab specimens and should not be used as a sole basis for treatment. Nasal washings and aspirates are unacceptable for Xpert Xpress SARS-CoV-2/FLU/RSV testing.  Fact Sheet for Patients: BloggerCourse.com  Fact Sheet for Healthcare Providers: SeriousBroker.it  This test is not yet approved or cleared by the [place] FDA and has been authorized for detection and/or diagnosis of SARS-CoV-2 by FDA under an Emergency Use Authorization (EUA). This EUA will remain in effect (meaning this test can be used) for the duration of the COVID-19 declaration under Section 564(b)(1) of the Act, 21 U.S.C. section 360bbb-3(b)(1), unless the authorization is terminated or revoked.     Resp Syncytial Virus by PCR NEGATIVE NEGATIVE Final    Comment: (NOTE) Fact Sheet for Patients: BloggerCourse.com  Fact Sheet for Healthcare Providers: SeriousBroker.it  This test is not yet approved or cleared by the [place] FDA and has been authorized for detection and/or diagnosis of SARS-CoV-2 by FDA under an Emergency Use Authorization (EUA). This EUA will remain in effect (meaning this test can be used) for the duration of the COVID-19 declaration under Section 564(b)(1) of the Act, 21 U.S.C. section 360bbb-3(b)(1), unless the authorization is terminated  or revoked.  Performed at [facility], [address] [place], [place] [address]   Culture, blood (routine x 2)     Status: None (Preliminary result)   Collection Time: 11/10/23  1:46 PM   Specimen: BLOOD  Result Value Ref Range Status   Specimen Description   Final    BLOOD RIGHT ANTECUBITAL Performed at [facility], [address], [place], [place] [address]    Special Requests   Final    BOTTLES DRAWN AEROBIC AND ANAEROBIC Blood Culture adequate volume Performed at [facility], [address] [place], [place] [address]    Culture   Final    NO GROWTH 3 DAYS Performed at [facility], [address], [place], [place] [address]    Report Status PENDING  Incomplete  Urine Culture     Status: Abnormal   Collection Time: 11/10/23  2:46 PM   Specimen: Urine, Random  Result Value Ref Range Status   Specimen Description   Final    URINE, RANDOM Performed at [facility], [address], [place], [place] [address]    Special Requests   Final    NONE Reflexed from [id number] Performed at [facility], [address], [place], [place] [address]    Culture (A)  Final    >=100,000 COLONIES/mL KLEBSIELLA PNEUMONIAE >=100,000 COLONIES/mL PROTEUS MIRABILIS    Report Status 11/13/2023 FINAL  Final   Organism ID, Bacteria KLEBSIELLA PNEUMONIAE (A)  Final   Organism ID, Bacteria PROTEUS MIRABILIS (A)  Final      Susceptibility   Klebsiella pneumoniae - MIC*    AMPICILLIN RESISTANT Resistant     CEFAZOLIN <=4 SENSITIVE Sensitive     CEFEPIME <=0.12 SENSITIVE Sensitive     CEFTRIAXONE <=0.25 SENSITIVE Sensitive     CIPROFLOXACIN <=0.25 SENSITIVE Sensitive     GENTAMICIN <=1 SENSITIVE Sensitive     IMIPENEM <=0.25 SENSITIVE Sensitive     NITROFURANTOIN <=16 SENSITIVE Sensitive     TRIMETH/SULFA <=20 SENSITIVE Sensitive     AMPICILLIN/SULBACTAM 4 SENSITIVE  Sensitive     PIP/TAZO <=4  SENSITIVE Sensitive ug/mL    * >=100,000 COLONIES/mL KLEBSIELLA PNEUMONIAE   Proteus mirabilis - MIC*    AMPICILLIN <=2 SENSITIVE Sensitive     CEFAZOLIN <=4 SENSITIVE Sensitive     CEFEPIME 0.25 SENSITIVE Sensitive     CEFTRIAXONE <=0.25 SENSITIVE Sensitive     CIPROFLOXACIN >=4 RESISTANT Resistant     GENTAMICIN <=1 SENSITIVE Sensitive     IMIPENEM 2 SENSITIVE Sensitive     NITROFURANTOIN 128 RESISTANT Resistant     TRIMETH/SULFA <=20 SENSITIVE Sensitive     AMPICILLIN/SULBACTAM <=2 SENSITIVE Sensitive     PIP/TAZO <=4 SENSITIVE Sensitive ug/mL    * >=100,000 COLONIES/mL PROTEUS MIRABILIS     Labs: BNP (last 3 results) No results for input(s): "BNP" in the last 8760 hours. Basic Metabolic Panel: Recent Labs  Lab 11/10/23 1336 11/10/23 1935 11/11/23 0317 11/13/23 0347  NA 138  --  141 138  K 3.4*  --  3.9 3.1*  CL 102  --  107 105  CO2 26  --  24 22  GLUCOSE 146*  --  108* 92  BUN 23  --  21 21  CREATININE 0.96  --  0.90 0.63  CALCIUM 8.8*  --  8.7* 8.6*  MG  --  2.0  --  2.1  PHOS  --  3.3  --  3.8   Liver Function Tests: Recent Labs  Lab 11/10/23 1336 11/12/23 0853 11/13/23 0347  AST 120* 156* 106*  ALT 113* 182* 146*  ALKPHOS 91 91 90  BILITOT 0.6 0.3 0.5  PROT 7.6 6.4* 6.5  ALBUMIN 2.9* 2.5* 2.7*   No results for input(s): "LIPASE", "AMYLASE" in the last 168 hours. No results for input(s): "AMMONIA" in the last 168 hours. CBC: Recent Labs  Lab 11/10/23 1336 11/11/23 0317 11/13/23 0347  WBC 10.9* 9.7 7.8  NEUTROABS 8.6*  --   --   HGB 11.6* 10.4* 11.0*  HCT 37.2 33.9* 35.6*  MCV 90.7 93.4 91.8  PLT 336 327 356   Cardiac Enzymes: No results for input(s): "CKTOTAL", "CKMB", "CKMBINDEX", "TROPONINI" in the last 168 hours. BNP: Invalid input(s): "POCBNP" CBG: Recent Labs  Lab 11/12/23 1626 11/12/23 2044 11/13/23 0006 11/13/23 0403 11/13/23 0723  GLUCAP 95 98 108* 94 85   D-Dimer No results for input(s): "DDIMER" in the last 72  hours. Hgb A1c No results for input(s): "HGBA1C" in the last 72 hours. Lipid Profile No results for input(s): "CHOL", "HDL", "LDLCALC", "TRIG", "CHOLHDL", "LDLDIRECT" in the last 72 hours. Thyroid function studies Recent Labs    11/10/23 1935  TSH 5.132*   Anemia work up Recent Labs    11/10/23 1935  VITAMINB12 1,442*   Urinalysis    Component Value Date/Time   COLORURINE YELLOW 11/10/2023 1446   APPEARANCEUR TURBID (A) 11/10/2023 1446   LABSPEC 1.016 11/10/2023 1446   PHURINE 6.0 11/10/2023 1446   GLUCOSEU NEGATIVE 11/10/2023 1446   GLUCOSEU NEGATIVE 07/26/2022 1346   HGBUR SMALL (A) 11/10/2023 1446   BILIRUBINUR NEGATIVE 11/10/2023 1446   BILIRUBINUR negative 08/19/2022 1211   BILIRUBINUR Negative 01/25/2022 1526   KETONESUR NEGATIVE 11/10/2023 1446   PROTEINUR >=300 (A) 11/10/2023 1446   UROBILINOGEN 0.2 08/19/2022 1211   UROBILINOGEN 0.2 07/26/2022 1346   NITRITE NEGATIVE 11/10/2023 1446   LEUKOCYTESUR SMALL (A) 11/10/2023 1446   Sepsis Labs Recent Labs  Lab 11/10/23 1336 11/11/23 0317 11/13/23 0347  WBC 10.9* 9.7 7.8   Microbiology Recent Results (from  the past 240 hours)  Culture, blood (routine x 2)     Status: None (Preliminary result)   Collection Time: 11/10/23  1:36 PM   Specimen: BLOOD  Result Value Ref Range Status   Specimen Description BLOOD LEFT ANTECUBITAL  Final   Special Requests   Final    BOTTLES DRAWN AEROBIC AND ANAEROBIC Blood Culture adequate volume   Culture   Final    NO GROWTH 3 DAYS Performed at [facility], [address], [place], [place] [address]    Report Status PENDING  Incomplete  Resp panel by RT-PCR (RSV, Flu A&B, Covid) Anterior Nasal Swab     Status: None   Collection Time: 11/10/23  1:46 PM   Specimen: Anterior Nasal Swab  Result Value Ref Range Status   SARS Coronavirus 2 by RT PCR NEGATIVE NEGATIVE Final    Comment: (NOTE) SARS-CoV-2 target nucleic acids are NOT DETECTED.  The SARS-CoV-2 RNA is  generally detectable in upper respiratory specimens during the acute phase of infection. The lowest concentration of SARS-CoV-2 viral copies this assay can detect is 138 copies/mL. A negative result does not preclude SARS-Cov-2 infection and should not be used as the sole basis for treatment or other patient management decisions. A negative result may occur with  improper specimen collection/handling, submission of specimen other than nasopharyngeal swab, presence of viral mutation(s) within the areas targeted by this assay, and inadequate number of viral copies(<138 copies/mL). A negative result must be combined with clinical observations, patient history, and epidemiological information. The expected result is Negative.  Fact Sheet for Patients:  BloggerCourse.com  Fact Sheet for Healthcare Providers:  SeriousBroker.it  This test is no t yet approved or cleared by the [place] FDA and  has been authorized for detection and/or diagnosis of SARS-CoV-2 by FDA under an Emergency Use Authorization (EUA). This EUA will remain  in effect (meaning this test can be used) for the duration of the COVID-19 declaration under Section 564(b)(1) of the Act, 21 U.S.C.section 360bbb-3(b)(1), unless the authorization is terminated  or revoked sooner.       Influenza A by PCR NEGATIVE NEGATIVE Final   Influenza B by PCR NEGATIVE NEGATIVE Final    Comment: (NOTE) The Xpert Xpress SARS-CoV-2/FLU/RSV plus assay is intended as an aid in the diagnosis of influenza from Nasopharyngeal swab specimens and should not be used as a sole basis for treatment. Nasal washings and aspirates are unacceptable for Xpert Xpress SARS-CoV-2/FLU/RSV testing.  Fact Sheet for Patients: BloggerCourse.com  Fact Sheet for Healthcare Providers: SeriousBroker.it  This test is not yet approved or cleared by the [place] FDA and has been authorized for detection and/or diagnosis of SARS-CoV-2 by FDA under an Emergency Use Authorization (EUA). This EUA will remain in effect (meaning this test can be used) for the duration of the COVID-19 declaration under Section 564(b)(1) of the Act, 21 U.S.C. section 360bbb-3(b)(1), unless the authorization is terminated or revoked.     Resp Syncytial Virus by PCR NEGATIVE NEGATIVE Final    Comment: (NOTE) Fact Sheet for Patients: BloggerCourse.com  Fact Sheet for Healthcare Providers: SeriousBroker.it  This test is not yet approved or cleared by the [place] FDA and has been authorized for detection and/or diagnosis of SARS-CoV-2 by FDA under an Emergency Use Authorization (EUA). This EUA will remain in effect (meaning this test can be used) for the duration of the COVID-19 declaration under Section 564(b)(1) of the Act, 21 U.S.C. section 360bbb-3(b)(1), unless the authorization  is terminated or revoked.  Performed at [facility], [address] [place], [place] [address]   Culture, blood (routine x 2)     Status: None (Preliminary result)   Collection Time: 11/10/23  1:46 PM   Specimen: BLOOD  Result Value Ref Range Status   Specimen Description   Final    BLOOD RIGHT ANTECUBITAL Performed at [facility], [address], [place], [place] [address]    Special Requests   Final    BOTTLES DRAWN AEROBIC AND ANAEROBIC Blood Culture adequate volume Performed at [facility], [address] [place], [place] [address]    Culture   Final    NO GROWTH 3 DAYS Performed at [facility], [address], [place], [place] [address]    Report Status PENDING  Incomplete  Urine Culture     Status: Abnormal   Collection Time: 11/10/23  2:46 PM   Specimen: Urine, Random  Result Value Ref Range Status   Specimen Description   Final    URINE,  RANDOM Performed at [facility], [address], [place], [place] [address]    Special Requests   Final    NONE Reflexed from [id number] Performed at [facility], [address], [place], [place] [address]    Culture (A)  Final    >=100,000 COLONIES/mL KLEBSIELLA PNEUMONIAE >=100,000 COLONIES/mL PROTEUS MIRABILIS    Report Status 11/13/2023 FINAL  Final   Organism ID, Bacteria KLEBSIELLA PNEUMONIAE (A)  Final   Organism ID, Bacteria PROTEUS MIRABILIS (A)  Final      Susceptibility   Klebsiella pneumoniae - MIC*    AMPICILLIN RESISTANT Resistant     CEFAZOLIN <=4 SENSITIVE Sensitive     CEFEPIME <=0.12 SENSITIVE Sensitive     CEFTRIAXONE <=0.25 SENSITIVE Sensitive     CIPROFLOXACIN <=0.25 SENSITIVE Sensitive     GENTAMICIN <=1 SENSITIVE Sensitive     IMIPENEM <=0.25 SENSITIVE Sensitive     NITROFURANTOIN <=16 SENSITIVE Sensitive     TRIMETH/SULFA <=20 SENSITIVE Sensitive     AMPICILLIN/SULBACTAM 4 SENSITIVE Sensitive     PIP/TAZO <=4 SENSITIVE Sensitive ug/mL    * >=100,000 COLONIES/mL KLEBSIELLA PNEUMONIAE   Proteus mirabilis - MIC*    AMPICILLIN <=2 SENSITIVE Sensitive     CEFAZOLIN <=4 SENSITIVE Sensitive     CEFEPIME 0.25 SENSITIVE Sensitive     CEFTRIAXONE <=0.25 SENSITIVE Sensitive     CIPROFLOXACIN >=4 RESISTANT Resistant     GENTAMICIN <=1 SENSITIVE Sensitive     IMIPENEM 2 SENSITIVE Sensitive     NITROFURANTOIN 128 RESISTANT Resistant     TRIMETH/SULFA <=20 SENSITIVE Sensitive     AMPICILLIN/SULBACTAM <=2 SENSITIVE Sensitive     PIP/TAZO <=4 SENSITIVE Sensitive ug/mL    * >=100,000 COLONIES/mL PROTEUS MIRABILIS     Time coordinating discharge:  I have spent 35 minutes face to face with the patient and on the ward discussing the patients care, assessment, plan and disposition with other care givers. >50% of the time was devoted counseling the patient about the risks and benefits of treatment/Discharge disposition and coordinating  care.   SIGNED:   Miguel Rota, MD  [facility] 11/13/2023, 11:23 AM   If 7PM-7AM, please contact night-coverage

## 2023-11-13 NOTE — Plan of Care (Signed)

## 2023-11-15 ENCOUNTER — Other Ambulatory Visit: Payer: Self-pay

## 2023-11-15 LAB — CULTURE, BLOOD (ROUTINE X 2)
Culture: NO GROWTH
Culture: NO GROWTH
Special Requests: ADEQUATE
Special Requests: ADEQUATE

## 2023-11-15 MED ORDER — EZETIMIBE 10 MG PO TABS: 10.0000 mg | ORAL_TABLET | Freq: Every day | ORAL | 1 refills | Status: AC

## 2024-01-16 ENCOUNTER — Ambulatory Visit (INDEPENDENT_AMBULATORY_CARE_PROVIDER_SITE_OTHER): Payer: BC Managed Care – PPO

## 2024-02-08 ENCOUNTER — Encounter: Payer: Self-pay | Admitting: Physician Assistant

## 2024-03-11 ENCOUNTER — Other Ambulatory Visit: Payer: Self-pay | Admitting: Family

## 2024-03-11 DIAGNOSIS — Z1231 Encounter for screening mammogram for malignant neoplasm of breast: Secondary | ICD-10-CM

## 2024-03-25 ENCOUNTER — Ambulatory Visit (INDEPENDENT_AMBULATORY_CARE_PROVIDER_SITE_OTHER): Admitting: Otolaryngology

## 2024-03-25 ENCOUNTER — Encounter (INDEPENDENT_AMBULATORY_CARE_PROVIDER_SITE_OTHER): Payer: Self-pay | Admitting: Otolaryngology

## 2024-03-25 VITALS — HR 80 | Ht 60.0 in | Wt 165.0 lb

## 2024-03-25 DIAGNOSIS — H6123 Impacted cerumen, bilateral: Secondary | ICD-10-CM

## 2024-03-26 ENCOUNTER — Ambulatory Visit

## 2024-03-26 NOTE — Progress Notes (Signed)
 Patient ID: Felicia Kelly, female   DOB: [birth year], 88 y.o.   MRN: [id number]  Procedure: Bilateral cerumen disimpaction.   Indication: Cerumen impaction, resulting in ear discomfort and conductive hearing loss.   Description: The patient is placed supine on the operating table. Under the operating microscope, the right ear canal is examined and is noted to be impacted with cerumen. The cerumen is carefully removed with a combination of suction catheters, cerumen curette, and alligator forceps. After the cerumen removal, the ear canal and tympanic membrane are noted to be normal. No middle ear effusion is noted. The same procedure is then repeated on the left side without exception. The patient tolerated the procedure well.  Follow-up care:  The patient is instructed not to use Q-tips to clean the ear canals. The patient will follow up in 6 months.

## 2024-04-22 ENCOUNTER — Ambulatory Visit
Admission: RE | Admit: 2024-04-22 | Discharge: 2024-04-22 | Disposition: A | Discharge status: Home / Self Care | Source: Ambulatory Visit | Attending: Family | Admitting: Family

## 2024-04-22 DIAGNOSIS — Z1231 Encounter for screening mammogram for malignant neoplasm of breast: Secondary | ICD-10-CM

## 2024-04-29 ENCOUNTER — Ambulatory Visit: Payer: BC Managed Care – PPO | Admitting: Physician Assistant

## 2024-04-30 ENCOUNTER — Ambulatory Visit (INDEPENDENT_AMBULATORY_CARE_PROVIDER_SITE_OTHER): Admitting: Physician Assistant

## 2024-04-30 ENCOUNTER — Encounter: Payer: Self-pay | Admitting: Physician Assistant

## 2024-04-30 VITALS — BP 134/73 | HR 69 | Resp 18 | Ht 61.0 in | Wt 159.0 lb

## 2024-04-30 DIAGNOSIS — F03918 Unspecified dementia, unspecified severity, with other behavioral disturbance: Secondary | ICD-10-CM

## 2024-04-30 MED ORDER — MEMANTINE HCL 5 MG PO TABS: ORAL_TABLET | ORAL | 1 refills | Status: AC

## 2024-04-30 NOTE — Progress Notes (Signed)
 Assessment/Plan:   Dementia likely due to Alzheimer's disease with behavioral disturbance   Felicia Kelly is a very pleasant 88 y.o. RH female with a history of hyperlipidemia, hypertension, mild carotid artery disease, osteoporosis, history of pulmonary nodules, hypothyroidism and a history of dementia likely due to Alzheimer's disease with behavioral disturbance seen today in follow up for memory loss.  Cognitive decline is noted, less verbal than prior.  Patient is currently on rivastigmine  6 mg twice a day. Discussed starting memantine  in an effort to slow down cognitive decline, sister agrees.      Mood is controlled with Depakote  125 mg twice a day, tolerating well.  She is at the memory care, enjoying the activities there.    Follow up in  6 months. Continue rivastigmine  6 mg twice a day, side effects discussed  Start Memantine  5 mg: Take 1 tablet (5 mg at night) for 2 weeks, then increase to 1 tablet (5 mg) twice a day, goal to increase to 10 mg bid if tolerated , side effects discussed   Continue Depakote  125 mg twice daily, side effects discussed  Recommend good control of her cardiovascular risk factors Continue 24/7 monitoring for safety at [facility] memory care     Subjective:    This patient is accompanied in the office by her sister  who supplements the history.  Previous records as well as any outside records available were reviewed prior to todays visit. Patient was last seen on 10/31/2023    Any changes in memory since last visit?   It is worse .  She is less conversant.  She lives at [facility] memory care, where she participates on the activities to her ability, including day trips. She recognizes some family and friends.  She has repetitive behaviors such as picking her skin of her lip. repeats oneself?  Endorsed Disoriented when walking into a room? Denies   Leaving objects?  May misplace things but not in unusual places  Wandering behavior?   denies   Any personality changes since last visit?  Denies.   Any worsening depression?:  Denies.   Hallucinations or paranoia?  Denies.  She has some sundowners, controlled with Depakote  bid  Seizures? denies    Any sleep changes? She sleeps well.  Denies vivid dreams, REM behavior or sleepwalking   Sleep apnea?   Denies.   Any hygiene concerns?  She does not like showering. Independent of bathing and dressing?  Endorsed  Does the patient needs help with medications?  Facility is in charge   Who is in charge of the finances?  Sister is in charge     Any changes in appetite?  Improved from prior.  Patient have trouble swallowing? Denies.   Does the patient cook? No Any headaches?   denies   Any vision changes? Denies  Chronic back pain  denies   Ambulates with difficulty?  She uses a walker for stability.  Does PT   Recent falls or head injuries? She fell in January in the bathroom and hit her head, no LOC.  Unilateral weakness, numbness or tingling? denies   Any tremors? Occasional tremors depending on the sugar. Any anosmia?  Denies   Any incontinence of urine?  Endorsed, wears diapers, no recent UTI   Any bowel dysfunction?   Denies      Patient lives at [facility] memory care  Does the patient drive? No longer drives      History of Present Illness 02/14/2019:  This is an 88 year old right-handed woman with a history of hypertension, hyperlipidemia, hypothyroidism, presenting for evaluation of worsening memory. Memory concerns were raised by family, patient has no insight into her condition and was upset throughout most of the visit (but participated). She states I forget things, but if you are asking if I have dementia, I don't. She thinks her memory is good. Family started noticing memory changes for several years, worsening recently where she is very forgetful, repeating things constantly. They got 10 Christmas cards from her because she did not remember sending them.  [name] reports she has trouble admitting she has issues. She has been living alone for the past 9 years since her husband passed away. She manages finances and medications and denies any difficulties doing these, although [name] reports that they had to help her last March because she could not get her taxes together, she seemed to have trouble keeping up with things. She continues to drive and denies getting lost. [name] reports she misplaces things frequently (she denies this). [name] has noticed more irritability since they approached her about memory concerns last March, no paranoia or hallucinations. She is independent with dressing and bathing, no hygiene concerns, house is well-kept. No family history of dementia, no history of significant head injuries or alcohol use.   She denies any headaches, dizziness, diplopia, dysarthria/dysphagia, neck/back pain, focal numbness/tingling/weakness, bowel/bladder dysfunction, anosmia, or tremors. Sleep is good. She denies any falls.  PREVIOUS MEDICATIONS: Donepezil   CURRENT MEDICATIONS:  Outpatient Encounter Medications as of 04/30/2024  Medication Sig   acetaminophen  (TYLENOL ) 500 MG tablet Take 500 mg by mouth every 8 (eight) hours as needed (for pain).   amLODipine  (NORVASC ) 5 MG tablet TAKE ONE TABLET BY MOUTH AT BEDTIME   aspirin  EC 81 MG tablet Take 81 mg by mouth every evening. Swallow whole.   Cholecalciferol  (VITAMIN D -3) 25 MCG (1000 UT) CAPS Take 1,000 Units by mouth every Monday, Wednesday, and Friday.   CORICIDIN COUGH/COLD 4-30 MG TABS Take 1 tablet by mouth every 6 (six) hours as needed (for cough and/or congestion).   cyanocobalamin  100 MCG tablet Take 100 mcg by mouth daily.   divalproex  (DEPAKOTE ) 125 MG DR tablet Take 1 tablet (125 mg total) by mouth 2 (two) times daily.   ezetimibe  (ZETIA ) 10 MG tablet Take 1 tablet (10 mg total) by mouth daily.   furosemide  (LASIX ) 20 MG tablet Take 20 mg by mouth in the morning.    levothyroxine  (SYNTHROID ) 50 MCG tablet Take 1 tablet by mouth 6 days a week, then take 2 tablets on Sundays. 30 days (Patient taking differently: Take 50-100 mcg by mouth See admin instructions. Take 100 mcg by mouth in the morning 30 minutes before breakfast on Sunday and 50 mcg on Mon/Tues/Wed/Thurs/Fri/Sat)   Magnesium  250 MG TABS Take 250 mg by mouth daily.   melatonin 1 MG TABS tablet Take 1 mg by mouth at bedtime.   memantine  (NAMENDA ) 5 MG tablet Take 1 tablet (5 mg at night) for 2 weeks, then increase to 1 tablet (5 mg) twice a day   metFORMIN  (GLUCOPHAGE -XR) 500 MG 24 hr tablet Take 1 tablet (500 mg total) by mouth daily with breakfast.   mirtazapine  (REMERON ) 30 MG tablet TAKE 1 TABLET BY MOUTH DAILY AT BEDTIME **NO REFILLS** (Patient taking differently: Take 30 mg by mouth at bedtime.)   Multiple Vitamin (DAILY-VITE MULTIVITAMIN PO) Take 1 tablet by mouth daily with breakfast.   Multiple Vitamins-Minerals (GNP HEALTHY EYES SUPERVISION) CAPS  Take 1 capsule by mouth in the morning.   Nutritional Supplements (ENSURE ORIGINAL) LIQD Take 237 mLs by mouth See admin instructions. ENSURE CHOCOLATE LIQUID DRINK: Drink 237 ml's by mouth in the morning   potassium chloride  (KLOR-CON  M) 10 MEQ tablet Take 1 tablet (10 mEq total) by mouth daily.   pravastatin  (PRAVACHOL ) 20 MG tablet Take 20 mg by mouth at bedtime.   Probiotic Product (ALIGN) 4 MG CAPS Take 4 mg by mouth at bedtime.   psyllium (REGULOID) 0.52 g capsule Take 0.52 g by mouth every evening.   rivastigmine  (EXELON ) 6 MG capsule Take 1 capsule (6 mg total) by mouth 2 (two) times daily.   valACYclovir  (VALTREX ) 500 MG tablet Take 1 tablet (500 mg total) by mouth daily.   No facility-administered encounter medications on file as of 04/30/2024.       02/27/2022    1:00 PM 01/11/2021    2:00 PM 05/14/2018   11:41 AM  MMSE - Mini Mental State Exam  Orientation to time 0 5 5  Orientation to Place 5 5 5   Registration 3 3 3   Attention/  Calculation 4 5 4   Recall 0 1 2  Language- name 2 objects 2 2 2   Language- repeat 1 1 1   Language- follow 3 step command 2 3 3   Language- read & follow direction 1 1 1   Write a sentence 1 1 1   Copy design 0 0 1  Total score 19 27 28       02/14/2019    3:00 PM  Montreal Cognitive Assessment   Visuospatial/ Executive (0/5) 0  Naming (0/3) 0  Attention: Read list of digits (0/2) 2  Attention: Read list of letters (0/1) 0  Attention: Serial 7 subtraction starting at 100 (0/3) 2  Language: Repeat phrase (0/2) 2  Language : Fluency (0/1) 0  Abstraction (0/2) 1  Delayed Recall (0/5) 5  Orientation (0/6) 4  Total 16  Adjusted Score (based on education) 17    Objective:     PHYSICAL EXAMINATION:    VITALS:   Vitals:   04/30/24 1413  BP: 134/73  Pulse: 69  Resp: 18  SpO2: 98%  Weight: 159 lb (72.1 kg)  Height: 5' 1 (1.549 m)    GEN:  The patient appears stated age and is in NAD. HEENT:  Normocephalic, atraumatic.   Neurological examination:  General: NAD, well-groomed, appears stated age. Orientation: The patient is alert.  Not oriented to person, place and date Cranial nerves: There is good facial symmetry.The speech is not fluent but clear. No aphasia or dysarthria. Fund of knowledge is reduced. Recent and remote memory are impaired. Attention and concentration are reduced. Unable to name objects or repeat phrases.  Hearing is intact to conversational tone.  Sensation: Sensation is intact to light touch throughout Motor: Strength is at least antigravity x4. DTR's 2/4 in UE/LE     Movement examination: Tone: There is normal tone in the UE/LE Abnormal movements:  no tremor seen today.  No myoclonus.  No asterixis.   Coordination:  There is no decremation with RAM's. Normal finger to nose  Gait and Station: The patient has difficulty arising out of a deep-seated chair without the use of the hands. The patient's stride length is short.  Gait is cautious and narrow.   Uses a walker for stability   Thank you for allowing us  the opportunity to participate in the care of this nice patient. Please do not hesitate to contact us  for  any questions or concerns.   Total time spent on today's visit was 22 minutes dedicated to this patient today, preparing to see patient, examining the patient, ordering tests and/or medications and counseling the patient, documenting clinical information in the EHR or other health record, independently interpreting results and communicating results to the patient/family, discussing treatment and goals, answering patient's questions and coordinating care.  Cc:  Venson Candis CROME, NP  Camie Sevin 04/30/2024 8:02 PM

## 2024-04-30 NOTE — Patient Instructions (Addendum)
 Good to see you!  Rivastigmine  to  6 mg twice a day   Start Memantine  5 mg: Take 1 tablet (5 mg at night) for 2 weeks, then increase to 1 tablet (5 mg) twice a day    control of blood pressure, cholesterol, sugar levels  Continue Depakote  125 mg night,  2 times a day   3. Follow-up in 6 months   FALL PRECAUTIONS: Be cautious when walking. Scan the area for obstacles that may increase the risk of trips and falls. When getting up in the mornings, sit up at the edge of the bed for a few minutes before getting out of bed. Consider elevating the bed at the head end to avoid drop of blood pressure when getting up. Walk always in a well-lit room (use night lights in the walls). Avoid area rugs or power cords from appliances in the middle of the walkways. Use a walker or a cane if necessary and consider physical therapy for balance exercise. Get your eyesight checked regularly.   HOME SAFETY: Consider the safety of the kitchen when operating appliances like stoves, microwave oven, and blender. Consider having supervision and share cooking responsibilities until no longer able to participate in those. Accidents with firearms and other hazards in the house should be identified and addressed as well.   ABILITY TO BE LEFT ALONE: If patient is unable to contact 911 operator, consider using LifeLine, or when the need is there, arrange for someone to stay with patients. Smoking is a fire hazard, consider supervision or cessation. Risk of wandering should be assessed by caregiver and if detected at any point, supervision and safe proof recommendations should be instituted.  RECOMMENDATIONS FOR ALL PATIENTS WITH MEMORY PROBLEMS: 1. Continue to exercise (Recommend 30 minutes of walking everyday, or 3 hours every week) 2. Increase social interactions - continue going to [place] and enjoy social gatherings with friends and family 3. Eat healthy, avoid fried foods and eat more fruits and vegetables 4. Maintain  adequate blood pressure, blood sugar, and blood cholesterol level. Reducing the risk of stroke and cardiovascular disease also helps promoting better memory. 5. Avoid stressful situations. Live a simple life and avoid aggravations. Organize your time and prepare for the next day in anticipation. 6. Sleep well, avoid any interruptions of sleep and avoid any distractions in the bedroom that may interfere with adequate sleep quality 7. Avoid sugar, avoid sweets as there is a strong link between excessive sugar intake, diabetes, and cognitive impairment We discussed the Mediterranean diet, which has been shown to help patients reduce the risk of progressive memory disorders and reduces cardiovascular risk. This includes eating fish, eat fruits and green leafy vegetables, nuts like almonds and hazelnuts, walnuts, and also use olive oil. Avoid fast foods and fried foods as much as possible. Avoid sweets and sugar as sugar use has been linked to worsening of memory function.  There is always a concern of gradual progression of memory problems. If this is the case, then we may need to adjust level of care according to patient needs. Support, both to the patient and caregiver, should then be put into place.

## 2024-06-04 ENCOUNTER — Ambulatory Visit: Admitting: Physician Assistant

## 2024-06-12 ENCOUNTER — Ambulatory Visit: Admitting: Internal Medicine

## 2024-09-03 ENCOUNTER — Ambulatory Visit: Admitting: Internal Medicine

## 2024-11-03 ENCOUNTER — Telehealth: Payer: Self-pay | Admitting: Physician Assistant

## 2024-11-03 NOTE — Telephone Encounter (Signed)
 [name] Reece(Sister) called in wanting to speak with the nurse regarding Felicia Kelly. She stated that she is in memory care and is not very mobile and wanted to know if the appt could be virtual. She is requesting a call back.   PH: [phone](cell)        [phone](home)

## 2024-11-03 NOTE — Telephone Encounter (Signed)
 Appt changed to virtual

## 2024-11-04 NOTE — Progress Notes (Signed)
 "  Virtual Visit via Video Note The purpose of this virtual visit is to provide medical care in a patient that is unable to be seen in person due to physical or health limitations   Consent was obtained for video visit:  yes  Answered questions that patient had about telehealth interaction:  yes I discussed the limitations, risks, security and privacy concerns of performing an evaluation and management service by telemedicine. I also discussed with the patient that there may be a patient responsible charge related to this service. The patient expressed understanding and agreed to proceed.  Pt location: Home Physician Location: office Name of referring provider:  Venson Candis CROME, NP I connected with Felicia Kelly at patients initiation/request on 11/05/2024 at  1:00 PM EST by video enabled telemedicine application and verified that I am speaking with the correct person using two identifiers. Pt MRN:  [id number] Pt DOB:  05-05-1936 Video Participants:  Felicia Kelly;  sister Felicia Kelly   Dementia likely due to Alzheimer's disease     Felicia Kelly is a 89 y.o. RH female with a history of hyperlipidemia, hypertension, mild carotid artery disease, osteoporosis, history of pulmonary nodules, hypothyroidism and a history of dementia likely due to Alzheimer's disease with behavioral disturbance  seen today in follow up via video visit for memory loss.  She is accompanied by her sister who supplements the history.  Previous records as well as any outside records available were reviewed prior to today's visit.  She was seen 04/30/2024.  Progression of the disease is noted, with worsening cognitive decline.  She is on rivastigmine  6 mg twice daily and memantine  5 mg twice daily. Sister reports that her memory is stable.  For mood she is on Depakote  125 mg twice daily, tolerating well.  Patient is at memory care, enjoying the activities there.  Follow-up in 6 months Continue rivastigmine  6 mg  and  memantine  5 mg, side effects discussed Continue Depakote  125 mg twice daily, side effects discussed Recommend good control of cardiovascular risk factors.   Continue 24/7 monitoring for safety at [facility] memory care  History of Present Illness:    Discussed the use of AI scribe software for clinical note transcription with the patient, who gave verbal consent to proceed.  History of Present Illness Felicia Kelly is an 89 year old female with Alzheimer's disease who presents for a follow-up on her memory loss. She is accompanied by her sister Felicia Kelly, who is also her financial caregiver.  Her memory remains stable with no significant changes since the last evaluation. She has become more social and participates more in activities at her memory care facility, [facility]. During the holidays, she did not recognize family and friends due to a lack of gatherings. She occasionally misplaces items but has not shown new repetitive behaviors. Her mood is good, and she is friendly and stable without recent episodes of sundowning.  She continues to take Depakote  twice daily for mood stabilization and has not experienced any recent seizures. Her sleep is good without nightmares or bad dreams.She has days, especially when doing PT, that she may sleep more as she becomes more tired.  She requires assistance with showers and dressing, provided by the facility staff. Her appetite is good, and she has gained some weight, no longer requiring Ensure supplements. She has no difficulty swallowing and has not experienced any recent falls since an incident in January where she fell in the bathroom but was not injured.  Tremors,  previously noted, have not been observed recently. She is incontinent of urine and wears diapers, with no recent urinary tract infections since being placed on a daily organic antibiotic by her urologist. Her bowel movements are regular without diarrhea or constipation. She uses  a walker consistently and participates in physical therapy. She does not engage in movement or dancing  at the facility but is involved in other daily activities.  Her current medications for memory include rivastigmine  6 mg and memantine  5 mg twice daily.      History of Present Illness 02/14/2019:  This is an 89 year old right-handed woman with a history of hypertension, hyperlipidemia, hypothyroidism, presenting for evaluation of worsening memory. Memory concerns were raised by family, patient has no insight into her condition and was upset throughout most of the visit (but participated). She states I forget things, but if you are asking if I have dementia, I don't. She thinks her memory is good. Family started noticing memory changes for several years, worsening recently where she is very forgetful, repeating things constantly. They got 10 Christmas cards from her because she did not remember sending them. Felicia Kelly reports she has trouble admitting she has issues. She has been living alone for the past 9 years since her husband passed away. She manages finances and medications and denies any difficulties doing these, although Felicia Kelly reports that they had to help her last March because she could not get her taxes together, she seemed to have trouble keeping up with things. She continues to drive and denies getting lost. Felicia Kelly reports she misplaces things frequently (she denies this). Felicia Kelly has noticed more irritability since they approached her about memory concerns last March, no paranoia or hallucinations. She is independent with dressing and bathing, no hygiene concerns, house is well-kept. No family history of dementia, no history of significant head injuries or alcohol use.   She denies any headaches, dizziness, diplopia, dysarthria/dysphagia, neck/back pain, focal numbness/tingling/weakness, bowel/bladder dysfunction, anosmia, or tremors. Sleep is good. She denies any falls.  PREVIOUS  MEDICATIONS: Donepezil     Medications Ordered Prior to Encounter[1]   Observations/Objective:   There were no vitals filed for this visit. GEN:  The patient appears stated age and is in NAD.  Neurological examination: Patient is asleep, unable to perform neuro exam    Follow Up Instructions:    -I discussed the assessment and treatment plan with the patient. The patient was provided an opportunity to ask questions and all were answered. The patient agreed with the plan and demonstrated an understanding of the instructions.   The patient was advised to call back or seek an in-person evaluation if the symptoms worsen or if the condition fails to improve as anticipated.   21 min Total time spent on today's visit was 21 minutes, including both face-to-face time and nonface-to-face time.  Time included that spent on review of records (prior notes available to me/labs/imaging if pertinent), discussing treatment and goals, answering patient's questions and coordinating care.   Camie Sevin, PA-C      [1]  Current Outpatient Medications on File Prior to Visit  Medication Sig Dispense Refill   acetaminophen  (TYLENOL ) 500 MG tablet Take 500 mg by mouth every 8 (eight) hours as needed (for pain).     amLODipine  (NORVASC ) 5 MG tablet TAKE ONE TABLET BY MOUTH AT BEDTIME 30 tablet 0   aspirin  EC 81 MG tablet Take 81 mg by mouth every evening. Swallow whole.     Cholecalciferol  (VITAMIN D -3) 25  MCG (1000 UT) CAPS Take 1,000 Units by mouth every Monday, Wednesday, and Friday.     CORICIDIN COUGH/COLD 4-30 MG TABS Take 1 tablet by mouth every 6 (six) hours as needed (for cough and/or congestion).     cyanocobalamin  100 MCG tablet Take 100 mcg by mouth daily.     divalproex  (DEPAKOTE ) 125 MG DR tablet Take 1 tablet (125 mg total) by mouth 2 (two) times daily. 180 tablet 3   ezetimibe  (ZETIA ) 10 MG tablet Take 1 tablet (10 mg total) by mouth daily. 90 tablet 1   furosemide  (LASIX ) 20 MG tablet  Take 20 mg by mouth in the morning.     levothyroxine  (SYNTHROID ) 50 MCG tablet Take 1 tablet by mouth 6 days a week, then take 2 tablets on Sundays. 30 days (Patient taking differently: Take 50-100 mcg by mouth See admin instructions. Take 100 mcg by mouth in the morning 30 minutes before breakfast on Sunday and 50 mcg on Mon/Tues/Wed/Thurs/Fri/Sat) 100 tablet 1   Magnesium  250 MG TABS Take 250 mg by mouth daily.     melatonin 1 MG TABS tablet Take 1 mg by mouth at bedtime.     memantine  (NAMENDA ) 5 MG tablet Take 1 tablet (5 mg at night) for 2 weeks, then increase to 1 tablet (5 mg) twice a day 180 tablet 1   metFORMIN  (GLUCOPHAGE -XR) 500 MG 24 hr tablet Take 1 tablet (500 mg total) by mouth daily with breakfast. 90 tablet 1   mirtazapine  (REMERON ) 30 MG tablet TAKE 1 TABLET BY MOUTH DAILY AT BEDTIME **NO REFILLS** (Patient taking differently: Take 30 mg by mouth at bedtime.) 90 tablet 1   Multiple Vitamin (DAILY-VITE MULTIVITAMIN PO) Take 1 tablet by mouth daily with breakfast.     Multiple Vitamins-Minerals (GNP HEALTHY EYES SUPERVISION) CAPS Take 1 capsule by mouth in the morning.     Nutritional Supplements (ENSURE ORIGINAL) LIQD Take 237 mLs by mouth See admin instructions. ENSURE CHOCOLATE LIQUID DRINK: Drink 237 ml's by mouth in the morning     potassium chloride  (KLOR-CON  M) 10 MEQ tablet Take 1 tablet (10 mEq total) by mouth daily. 30 tablet 3   pravastatin  (PRAVACHOL ) 20 MG tablet Take 20 mg by mouth at bedtime.     Probiotic Product (ALIGN) 4 MG CAPS Take 4 mg by mouth at bedtime.     psyllium (REGULOID) 0.52 g capsule Take 0.52 g by mouth every evening.     rivastigmine  (EXELON ) 6 MG capsule Take 1 capsule (6 mg total) by mouth 2 (two) times daily. 60 capsule 11   valACYclovir  (VALTREX ) 500 MG tablet Take 1 tablet (500 mg total) by mouth daily. 90 tablet 1   No current facility-administered medications on file prior to visit.   "

## 2024-11-05 ENCOUNTER — Telehealth: Admitting: Physician Assistant

## 2024-11-05 DIAGNOSIS — F03918 Unspecified dementia, unspecified severity, with other behavioral disturbance: Secondary | ICD-10-CM
